# Patient Record
Sex: Male | Born: 1940
Health system: Southern US, Community
[De-identification: ages and names within clinical notes are randomized; demographics above are authoritative.]

## PROBLEM LIST (undated history)

## (undated) DIAGNOSIS — G2581 Restless legs syndrome: Secondary | ICD-10-CM

## (undated) DIAGNOSIS — E785 Hyperlipidemia, unspecified: Secondary | ICD-10-CM

## (undated) DIAGNOSIS — S060X9A Concussion with loss of consciousness of unspecified duration, initial encounter: Secondary | ICD-10-CM

## (undated) DIAGNOSIS — E669 Obesity, unspecified: Secondary | ICD-10-CM

## (undated) DIAGNOSIS — G510 Bell's palsy: Secondary | ICD-10-CM

## (undated) DIAGNOSIS — S060XAA Concussion with loss of consciousness status unknown, initial encounter: Secondary | ICD-10-CM

## (undated) DIAGNOSIS — E1169 Type 2 diabetes mellitus with other specified complication: Secondary | ICD-10-CM

## (undated) DIAGNOSIS — H269 Unspecified cataract: Secondary | ICD-10-CM

## (undated) DIAGNOSIS — M5416 Radiculopathy, lumbar region: Secondary | ICD-10-CM

## (undated) DIAGNOSIS — N200 Calculus of kidney: Secondary | ICD-10-CM

## (undated) DIAGNOSIS — K219 Gastro-esophageal reflux disease without esophagitis: Secondary | ICD-10-CM

## (undated) DIAGNOSIS — M1711 Unilateral primary osteoarthritis, right knee: Secondary | ICD-10-CM

## (undated) DIAGNOSIS — R112 Nausea with vomiting, unspecified: Secondary | ICD-10-CM

## (undated) DIAGNOSIS — M1611 Unilateral primary osteoarthritis, right hip: Secondary | ICD-10-CM

## (undated) DIAGNOSIS — M48061 Spinal stenosis, lumbar region without neurogenic claudication: Secondary | ICD-10-CM

## (undated) DIAGNOSIS — H409 Unspecified glaucoma: Secondary | ICD-10-CM

## (undated) DIAGNOSIS — Z9889 Other specified postprocedural states: Secondary | ICD-10-CM

## (undated) DIAGNOSIS — M1990 Unspecified osteoarthritis, unspecified site: Secondary | ICD-10-CM

## (undated) DIAGNOSIS — R351 Nocturia: Secondary | ICD-10-CM

## (undated) DIAGNOSIS — I10 Essential (primary) hypertension: Secondary | ICD-10-CM

## (undated) HISTORY — PX: RECONSTRUCTION OF EYELID: SHX6576

## (undated) HISTORY — PX: LITHOTRIPSY: SUR834

## (undated) HISTORY — DX: Concussion with loss of consciousness of unspecified duration, initial encounter: S06.0X9A

## (undated) HISTORY — PX: CATARACT EXTRACTION: SUR2

## (undated) HISTORY — PX: COLONOSCOPY: SHX174

## (undated) HISTORY — PX: ABDOMINAL HERNIA REPAIR: SHX539

## (undated) HISTORY — DX: Unilateral primary osteoarthritis, right knee: M17.11

## (undated) HISTORY — PX: ELBOW SURGERY: SHX618

## (undated) HISTORY — DX: Unspecified cataract: H26.9

## (undated) HISTORY — DX: Bell's palsy: G51.0

## (undated) HISTORY — PX: SHOULDER SURGERY: SHX246

## (undated) HISTORY — PX: LUMBAR DISC SURGERY: SHX700

## (undated) HISTORY — DX: Concussion with loss of consciousness status unknown, initial encounter: S06.0XAA

## (undated) HISTORY — PX: BACK SURGERY: SHX140

---

## 1978-03-30 HISTORY — PX: HERNIA REPAIR: SHX51

## 1978-03-30 HISTORY — PX: SHOULDER ARTHROSCOPY: SHX128

## 1988-03-30 HISTORY — PX: ELBOW ARTHROSCOPY: SUR87

## 1997-07-04 ENCOUNTER — Ambulatory Visit (HOSPITAL_COMMUNITY): Admission: RE | Admit: 1997-07-04 | Discharge: 1997-07-04 | Payer: Self-pay | Admitting: Family Medicine

## 2005-11-09 ENCOUNTER — Ambulatory Visit: Payer: Self-pay | Admitting: Gastroenterology

## 2005-11-20 ENCOUNTER — Ambulatory Visit: Payer: Self-pay | Admitting: Gastroenterology

## 2006-03-30 HISTORY — PX: JOINT REPLACEMENT: SHX530

## 2006-06-08 ENCOUNTER — Encounter: Admission: RE | Admit: 2006-06-08 | Discharge: 2006-06-08 | Payer: Self-pay | Admitting: Family Medicine

## 2006-06-22 ENCOUNTER — Encounter: Admission: RE | Admit: 2006-06-22 | Discharge: 2006-06-22 | Payer: Self-pay | Admitting: Family Medicine

## 2006-07-29 HISTORY — PX: TOTAL KNEE ARTHROPLASTY: SHX125

## 2006-08-19 ENCOUNTER — Encounter: Admission: RE | Admit: 2006-08-19 | Discharge: 2006-08-19 | Payer: Self-pay | Admitting: Family Medicine

## 2007-08-02 ENCOUNTER — Ambulatory Visit: Admission: RE | Admit: 2007-08-02 | Discharge: 2007-08-02 | Payer: Self-pay | Admitting: Orthopedic Surgery

## 2007-08-04 ENCOUNTER — Ambulatory Visit: Payer: Self-pay | Admitting: Internal Medicine

## 2007-08-05 ENCOUNTER — Ambulatory Visit: Payer: Self-pay

## 2007-08-15 ENCOUNTER — Inpatient Hospital Stay (HOSPITAL_COMMUNITY): Admission: RE | Admit: 2007-08-15 | Discharge: 2007-08-17 | Payer: Self-pay | Admitting: Orthopedic Surgery

## 2008-03-30 HISTORY — PX: STONE EXTRACTION WITH BASKET: SHX5318

## 2009-02-27 ENCOUNTER — Encounter: Admission: RE | Admit: 2009-02-27 | Discharge: 2009-02-27 | Payer: Self-pay | Admitting: Orthopedic Surgery

## 2009-03-01 DIAGNOSIS — M109 Gout, unspecified: Secondary | ICD-10-CM | POA: Insufficient documentation

## 2009-03-26 ENCOUNTER — Encounter: Admission: RE | Admit: 2009-03-26 | Discharge: 2009-03-26 | Payer: Self-pay | Admitting: Urology

## 2009-04-01 ENCOUNTER — Ambulatory Visit (HOSPITAL_BASED_OUTPATIENT_CLINIC_OR_DEPARTMENT_OTHER): Admission: RE | Admit: 2009-04-01 | Discharge: 2009-04-01 | Payer: Self-pay | Admitting: Urology

## 2009-04-18 ENCOUNTER — Ambulatory Visit (HOSPITAL_COMMUNITY): Admission: RE | Admit: 2009-04-18 | Discharge: 2009-04-18 | Payer: Self-pay | Admitting: Urology

## 2010-06-15 LAB — POCT I-STAT 4, (NA,K, GLUC, HGB,HCT)
Glucose, Bld: 126 mg/dL — ABNORMAL HIGH (ref 70–99)
HCT: 48 % (ref 39.0–52.0)
Hemoglobin: 16.3 g/dL (ref 13.0–17.0)
Potassium: 4.2 mEq/L (ref 3.5–5.1)
Sodium: 141 mEq/L (ref 135–145)

## 2010-08-12 NOTE — Op Note (Signed)
NAMEKORBIN, MAPPS               ACCOUNT NO.:  [id number]   MEDICAL RECORD NO.:  [id number]          PATIENT TYPE:  INP   LOCATION:  5022                         FACILITY:  [facility]   PHYSICIAN:  Robert A. Thurston Hole, M.D. DATE OF BIRTH:  [birth year]   DATE OF PROCEDURE:  08/15/2007  DATE OF DISCHARGE:                               OPERATIVE REPORT   PREOPERATIVE DIAGNOSIS:  Left knee degenerative joint disease.   POSTOPERATIVE DIAGNOSIS:  Left knee degenerative joint disease.   PROCEDURE:  Left total knee replacement using DePuy cemented total knee  system with #6 cemented femur and #6 cemented tibia with 12.5-mm  polyethylene RP tibial spacer and 38-mm polyethylene cemented patella.   SURGEON:  Elana Alm. Thurston Hole, MD   ASSISTANT:  Julien Girt, PA   ANESTHESIA:  General.   OPERATIVE TIME:  1 hour and 30 minutes.   COMPLICATIONS:  None.   DESCRIPTION OF PROCEDURE:  Mr. Curci was brought to the operating room  on Aug 15, 2007, after a femoral nerve block placed in the holding area  by Anesthesia.  He was placed on the operative table in supine position.  He received vancomycin 1.5 grams IV preoperatively for prophylaxis.  After being placed under general anesthesia, Foley catheter placed under  sterile conditions.  His left knee was examined.  Range of motion from  minus 8 to 125 degrees with moderate varus deformity.  Knee stable  ligamentous exam, normal patellar tracking.  Left leg was prepped using  sterile DuraPrep and draped using sterile technique.  Leg was  exsanguinated and a thigh tourniquet elevated at 365 mm.  Time-out was  called,  initialized and agreed to with everyone in the room.  Initially, through a 15-cm longitudinal incision based over the patella,  initial exposure was made.  Underlying subcutaneous tissues were incised  along with skin incision.  A median arthrotomy was performed revealing  an excessive amount of normal-appearing joint fluid.  The  articular  surfaces were inspected.  He had grade 4 changes medially, grade 3 and 4  changes laterally, and grade 3 and 4 changes of patellofemoral joint.  Osteophytes removed from the femoral condyles and tibial plateau.  The  medial lateral meniscal remnants were removed as well as the anterior  cruciate ligament.  An intramedullary drill was then drilled up the  femoral canal for placement of distal femoral cutting jig, which was  placed in the appropriate manner of rotation and distal 13-mm cut was  made.  The distal femur was incised.  The #6 was found be the  appropriate size.  The #6 cutting jig was placed in the appropriate  manner of external rotation and then these cuts were made.  At this  point, the proximal tibia was exposed.  The tibial spines were removed  with an oscillating saw.  Intramedullary drill was drilled down to the  tibial canal for placement of proximal tibial cutting jig, which was  placed in the appropriate manner of rotation and a proximal 6-mm cut was  made based off the medial or lower side.  Spacer blocks were  then placed  in flexion and extension.  A 12.5-mm blocks gave excellent balancing,  excellent stability, and excellent correction of his flexion and varus  deformities.  At this point, the #6 tibial base plate trial was placed  on the cut tibial surface with an excellent fit and a keel cut was made.  The PCL box cutter was then placed on the distal femur, and then these  cuts were made.  After this was done, then the #6 femoral trial was  placed with the #6 tibial base plate trial and a 12.5-mm polyethylene RP  tibial spacer, the knee was reduced, taken through range of motion from  0 to 135 degrees with excellent stability, excellent correction of his  flexion, and varus deformities and normal patellar tracking.  At this  point, a resurfacing 10-mm cut was made on the patella and 3 locking  holes placed for a 38-mm patella.  The patella trial was  again placed  again patellofemoral tracking was evaluated and found to be normal.  At  this point, it was felt that all the trial components were of excellent  size and stability.  They were then removed.  The knee was then jet  lavage irrigated with 3 liters of saline.  The proximal tibia was then  exposed.  A #6 tibial baseplate with cement backing was hammered into  position with an excellent fit with excess cement being removed from  around the edges.  The  #6 femoral component with cement backing was  hammered into position also with an excellent fit with excess cement  being removed from around the edges.  A 12.5-mm polyethylene RP tibial  spacer was placed on tibial baseplate.  The knee reduced, taken to full  range of motion, found to be stable with excellent correction of his  flexion and varus deformities and normal patellar tracking.  The 38-mm  polyethylene cement backed patella was then placed in its position and  held there with a clamp.  After the cement hardened again,  patellofemoral tracking was evaluated and found to be normal.  At this  point, felt that all the components were of excellent size and  stability.  The wound was further irrigated with saline.  The tourniquet  was then released.  Hemostasis obtained with cautery.  The arthrotomy  was then closed with #1 Ethilon suture over 2 medium Hemovac drains.  Subcutaneous tissues closed with 0 and 2-0 Vicryl.  Subcuticular layer  closed with 4-0 Monocryl.  Sterile dressings and long-leg splint  applied.  The patient then awakened, extubated, and taken to recovery  room in stable condition.  Needle and sponge counts were correct x2 at  the end of the case.  Neurovascular status normal as well  postoperatively.      Robert A. Thurston Hole, M.D.  Electronically Signed     RAW/MEDQ  D:  08/15/2007  T:  08/16/2007  Job:  [id number]

## 2010-08-12 NOTE — Letter (Signed)
Aug 04, 2007    Robert A. Thurston Hole, M.D.  [address]  [address]  [place], [place] [address]   RE:  Ronnie, Beck  MRN:  [id number]  /  DOB:  1940/07/15   Dear [name]:   Thank you very much for asking [place] to see [name] in preoperative  consultation for total knee replacement scheduled next week.  This is  being undertaken because of longstanding problems with his knee and  transferred issues to the hip and back.   On his preoperative screening, he was noted to have an abnormal  electrocardiogram, and he was referred.  The patient denies chest pain,  exercise intolerance.  He does not have shortness of breath, peripheral  edema, or nocturnal dyspnea.   He has had some irregular palpitations, but these have been fairly  quiescent of late.   His cardiac risk factors are notable for (1) diabetes, (2) hypertension,  (3) dyslipidemia, (4) family history, and he has abdominal obesity also  constituting the metabolic syndrome.   He has not used cigarettes for many, many years.   PAST MEDICAL HISTORY:  In addition to the above is notable for:  1. GE reflux.  2. Arthritis.  3. Gout.  4. Allergies.   PAST SURGICAL HISTORY:  Notable for eye surgery.   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  He now works as a Lawyer.   MEDICATIONS:  1. Allopurinol 300.  2. Lisinopril 20.  3. Norvasc 10.  4. Hydrochlorothiazide 25.  5. Aspirin 81.   ALLERGIES:  NO KNOWN DRUG ALLERGIES.   PHYSICAL EXAMINATION:  GENERAL:  He is an elderly Caucasian male  appearing his stated age of 39.  He was in no acute distress.  VITAL SIGNS:  His weight is 258 pounds.  Blood pressure 138/85, pulse  90.  SKIN:  Warm and dry.  HEENT:  No icterus or xanthoma.  NECK:  The neck veins were flat.  The carotids were brisk and full  bilaterally without bruits.  BACK:  Without kyphosis, scoliosis.  LUNGS:  Clear.  HEART:  Heart sounds were regular without  murmurs or gallops.  ABDOMEN:  Soft with active bowel sounds without midline pulsation or  hepatomegaly.  EXTREMITIES:  Femoral pulses were 2+, distal pulses were intact.  There  was no clubbing, cyanosis or edema.  NEUROLOGIC:  Grossly normal.   Electrocardiogram from preoperative surgery demonstrated sinus rhythm  with first-degree AV block with a PR interval of 0.22, with the other  intervals were 0.9/0.40.  The rate was 60 beats per minute.   IMPRESSION:  1. Anticipated knee replacement surgery.  2. Abnormal electrocardiogram manifested by first-degree      atrioventricular block.  3. Cardiac risk factors including metabolic syndrome including      diabetes, hypertension, abdominal obesity, dyslipidemia.  4. Family history.   DISCUSSION:  [name], [name] has a minimally abnormal  electrocardiogram.  The major issue in my mind is his multiple risk  factors for coronary disease for which there are no symptoms.  This is  in a diabetic is not all that reassuring.  There has been a debate  recently in the literature on the value of the screening of asymptomatic  diabetics for coronary disease and has not been largely in favor.  However, given the magnitude of his surgery and his family history, I am  inclined to air on the side of caution here with  [name].  I have  reviewed the  above with him, including the borderline indication for proceeding with  Myoview scanning.  We will plan to do this tomorrow in anticipation of  his surgery on Monday.   Best of luck.  Please let us know when he is there, and we will be glad  to follow him along side.    Sincerely,      Duke Salvia, MD, [facility]  Electronically Signed    SCK/MedQ  DD: 08/04/2007  DT: 08/04/2007  Job #: [id number]   CC:    Quita Skye. Artis Flock, M.D.

## 2010-12-24 LAB — BASIC METABOLIC PANEL
BUN: 15
BUN: 15
CO2: 29
CO2: 31
Calcium: 8.3 — ABNORMAL LOW
Calcium: 8.4
Chloride: 101
Chloride: 101
Creatinine, Ser: 1.07
Creatinine, Ser: 1.23
GFR calc Af Amer: >60
GFR calc Af Amer: >60
GFR calc non Af Amer: 59 — ABNORMAL LOW
GFR calc non Af Amer: >60
Glucose, Bld: 133 — ABNORMAL HIGH
Glucose, Bld: 145 — ABNORMAL HIGH
Potassium: 3.4 — ABNORMAL LOW
Potassium: 3.9
Sodium: 135
Sodium: 136

## 2010-12-24 LAB — CBC
HCT: 39.3
HCT: 39.4
Hemoglobin: 13.5
Hemoglobin: 13.9
MCHC: 34.3
MCHC: 35.4
MCV: 90.5
MCV: 90.6
Platelets: 206
Platelets: 206
RBC: 4.34
RBC: 4.35
RDW: 14.4
RDW: 14.8
WBC: 10.1
WBC: 10.8 — ABNORMAL HIGH

## 2010-12-24 LAB — PROTIME-INR
INR: 1.2
INR: 1.4
Prothrombin Time: 15
Prothrombin Time: 17 — ABNORMAL HIGH

## 2011-03-30 ENCOUNTER — Other Ambulatory Visit: Payer: Self-pay

## 2011-03-30 ENCOUNTER — Emergency Department (HOSPITAL_COMMUNITY)
Admission: EM | Admit: 2011-03-30 | Discharge: 2011-03-30 | Disposition: A | Discharge status: Home / Self Care | Payer: Medicare Other | Attending: Emergency Medicine | Admitting: Emergency Medicine

## 2011-03-30 ENCOUNTER — Encounter: Payer: Self-pay | Admitting: *Deleted

## 2011-03-30 DIAGNOSIS — Z9889 Other specified postprocedural states: Secondary | ICD-10-CM | POA: Insufficient documentation

## 2011-03-30 DIAGNOSIS — R109 Unspecified abdominal pain: Secondary | ICD-10-CM | POA: Insufficient documentation

## 2011-03-30 DIAGNOSIS — I10 Essential (primary) hypertension: Secondary | ICD-10-CM | POA: Insufficient documentation

## 2011-03-30 DIAGNOSIS — N201 Calculus of ureter: Secondary | ICD-10-CM | POA: Insufficient documentation

## 2011-03-30 DIAGNOSIS — E119 Type 2 diabetes mellitus without complications: Secondary | ICD-10-CM | POA: Insufficient documentation

## 2011-03-30 HISTORY — DX: Essential (primary) hypertension: I10

## 2011-03-30 HISTORY — DX: Calculus of kidney: N20.0

## 2011-03-30 LAB — URINALYSIS, ROUTINE W REFLEX MICROSCOPIC
Bilirubin Urine: NEGATIVE
Glucose, UA: 100 mg/dL — AB
Ketones, ur: NEGATIVE mg/dL
Leukocytes, UA: NEGATIVE
Nitrite: NEGATIVE
Protein, ur: NEGATIVE mg/dL
Specific Gravity, Urine: 1.022 (ref 1.005–1.030)
Urobilinogen, UA: 0.2 mg/dL (ref 0.0–1.0)
pH: 5.5 (ref 5.0–8.0)

## 2011-03-30 LAB — COMPREHENSIVE METABOLIC PANEL
ALT: 79 U/L — ABNORMAL HIGH (ref 0–53)
AST: 53 U/L — ABNORMAL HIGH (ref 0–37)
Albumin: 2.6 g/dL — ABNORMAL LOW (ref 3.5–5.2)
Alkaline Phosphatase: 58 U/L (ref 39–117)
BUN: 81 mg/dL — ABNORMAL HIGH (ref 6–23)
CO2: 29 mEq/L (ref 19–32)
Calcium: 10.3 mg/dL (ref 8.4–10.5)
Chloride: 92 mEq/L — ABNORMAL LOW (ref 96–112)
Creatinine, Ser: 2.71 mg/dL — ABNORMAL HIGH (ref 0.50–1.35)
GFR calc Af Amer: 26 mL/min — ABNORMAL LOW (ref >90)
GFR calc non Af Amer: 22 mL/min — ABNORMAL LOW (ref >90)
Glucose, Bld: 297 mg/dL — ABNORMAL HIGH (ref 70–99)
Potassium: 3.5 mEq/L (ref 3.5–5.1)
Sodium: 132 mEq/L — ABNORMAL LOW (ref 135–145)
Total Bilirubin: 0.3 mg/dL (ref 0.3–1.2)
Total Protein: 6.7 g/dL (ref 6.0–8.3)

## 2011-03-30 LAB — URINE MICROSCOPIC-ADD ON

## 2011-03-30 MED ORDER — OXYCODONE-ACETAMINOPHEN 5-325 MG PO TABS: 1.0000 | ORAL_TABLET | ORAL | Status: DC | PRN

## 2011-03-30 MED ORDER — SODIUM CHLORIDE 0.9 % IV SOLN: INTRAVENOUS | Status: DC

## 2011-03-30 NOTE — ED Notes (Signed)
Pt states "was out of town when the pain started, it really jumped on me on the 24th, left flank pain, ocassionally nausea, no vomiting"

## 2011-03-30 NOTE — ED Provider Notes (Cosign Needed)
History     CSN: [id number]  Arrival date & time 03/30/11  1311   First MD Initiated Contact with Patient 03/30/11 1648      Chief Complaint  Patient presents with  . Flank Pain    (Consider location/radiation/quality/duration/timing/severity/associated sxs/prior treatment) HPI Comments: Patient is a 70 year old man who developed flank pain on the left side about a week ago. He was out of town then. He has a history of ureteral stones, which he required stenting and also lithotripsy. He was seen by his primary care physician, Maryelizabeth Rowan M.D., who ordered a CT of his abdomen. This showed an 11 and a 6 mm stone in the left ureter at the level of L3-4, stacked one on top of the other. There was an associated hydronephrosis. He was therefore devised to go to the ED for further evaluation and urologic consultation. He had previously been treated by Jethro Bolus M.D.  Patient is a 70 y.o. male presenting with flank pain. The history is provided by the patient and medical records. No language interpreter was used.  Flank Pain This is a new problem. The current episode started more than 1 week ago. Episode frequency: He has intermittent left flank pain. The problem has not changed since onset.The symptoms are aggravated by nothing. The symptoms are relieved by nothing. He has tried nothing for the symptoms.    Past Medical History  Diagnosis Date  . Kidney stones   . Hypertension   . Diabetes mellitus     Past Surgical History  Procedure Date  . Lithotripsy   . Joint replacement     left knee  . Shoulder surgery     right  . Elbow surgery     right  . Abdominal hernia repair     No family history on file.  History  Substance Use Topics  . Smoking status: Never Smoker   . Smokeless tobacco: Not on file  . Alcohol Use: No      Review of Systems  Constitutional: Negative.  Negative for fever.  HENT: Negative.   Eyes: Negative.   Respiratory: Negative.     Cardiovascular: Negative.   Gastrointestinal: Negative.   Genitourinary: Positive for flank pain.  Musculoskeletal: Negative.   Skin: Negative.   Neurological: Negative.   Psychiatric/Behavioral: Negative.     Allergies  Review of patient's allergies indicates no known allergies.  Home Medications   Current Outpatient Rx  Name Route Sig Dispense Refill  . ALLOPURINOL 300 MG PO TABS Oral Take 300 mg by mouth daily.      . ASPIRIN 81 MG PO TABS Oral Take 160 mg by mouth daily.      [name] CIPROFLOXACIN HCL 500 MG PO TABS Oral Take 500 mg by mouth 2 (two) times daily.      [name] DICLOFENAC SODIUM 1 % TD GEL Topical Apply topically.      [name] GEMFIBROZIL 600 MG PO TABS Oral Take 300 mg by mouth 2 (two) times daily before a meal.      . METFORMIN HCL 500 MG PO TABS Oral Take 500 mg by mouth 2 (two) times daily with a meal.      . OLMESARTAN-AMLODIPINE-HCTZ 40-10-25 MG PO TABS Oral Take by mouth.      [name] ROPINIROLE HCL 3 MG PO TABS Oral Take 3 mg by mouth at bedtime.      [name] TADALAFIL 10 MG PO TABS Oral Take 10 mg by mouth daily as needed.      [name]  TESTOSTERONE 1.25 GM/ACT (1%) TD GEL Transdermal Place onto the skin.      [name] TRAMADOL HCL 50 MG PO TABS Oral Take 50 mg by mouth every 6 (six) hours as needed. Maximum dose= 8 tablets per day       BP 133/71  Pulse 92  Temp(Src) 98.4 F (36.9 C) (Oral)  Resp 18  Wt 241 lb (109.317 kg)  SpO2 97%  Physical Exam  Constitutional: He is oriented to person, place, and time. He appears well-developed. Distressed: in no current distress.  HENT:  Head: Normocephalic.  Right Ear: External ear normal.  Left Ear: External ear normal.  Mouth/Throat: Oropharynx is clear and moist.  Eyes: Conjunctivae and EOM are normal. Pupils are equal, round, and reactive to light.  Neck: Normal range of motion. Neck supple.  Cardiovascular: Normal rate, regular rhythm and normal heart sounds.   Pulmonary/Chest: Effort normal and breath sounds normal.  Abdominal: Soft.  Bowel sounds are normal.       He localizes pain to the left flank and the left costovertebral angle area. There is no palpable deformity or point tenderness there.  Musculoskeletal: Normal range of motion.  Neurological: He is alert and oriented to person, place, and time.       No sensory or motor loss.  Skin: Skin is warm and dry.  Psychiatric: He has a normal mood and affect. His behavior is normal.    ED Course  Procedures (including critical care time)  5:14 PM Had CT reviewed unofficially--has an 11 mm and a 6 mm stone stacked on top of each other at the level of L3-4, with an associated hydronephrosis. A call was placed to urology. Laboratory workup was ordered.   Date: 03/30/2011  Rate:97 Rhythm: normal sinus rhythm  QRS Axis: normal  Intervals: normal  ST/T Wave abnormalities: normal  Conduction Disutrbances:none  Narrative Interpretation: Normal EKG  Old EKG Reviewed: unchanged  7:18 PM Results for orders placed during the hospital encounter of 03/30/11  COMPREHENSIVE METABOLIC PANEL      Component Value Range   Sodium 132 (*) 135 - 145 (mEq/L)   Potassium 3.5  3.5 - 5.1 (mEq/L)   Chloride 92 (*) 96 - 112 (mEq/L)   CO2 29  19 - 32 (mEq/L)   Glucose, Bld 297 (*) 70 - 99 (mg/dL)   BUN 81 (*) 6 - 23 (mg/dL)   Creatinine, Ser 1.61 (*) 0.50 - 1.35 (mg/dL)   Calcium 09.6  8.4 - 10.5 (mg/dL)   Total Protein 6.7  6.0 - 8.3 (g/dL)   Albumin 2.6 (*) 3.5 - 5.2 (g/dL)   AST 53 (*) 0 - 37 (U/L)   ALT 79 (*) 0 - 53 (U/L)   Alkaline Phosphatase 58  39 - 117 (U/L)   Total Bilirubin 0.3  0.3 - 1.2 (mg/dL)   GFR calc non Af Amer 22 (*) >90 (mL/min)   GFR calc Af Amer 26 (*) >90 (mL/min)  URINALYSIS, ROUTINE W REFLEX MICROSCOPIC      Component Value Range   Color, Urine YELLOW  YELLOW    APPearance CLEAR  CLEAR    Specific Gravity, Urine 1.022  1.005 - 1.030    pH 5.5  5.0 - 8.0    Glucose, UA 100 (*) NEGATIVE (mg/dL)   Hgb urine dipstick TRACE (*) NEGATIVE    Bilirubin  Urine NEGATIVE  NEGATIVE    Ketones, ur NEGATIVE  NEGATIVE (mg/dL)   Protein, ur NEGATIVE  NEGATIVE (mg/dL)   Urobilinogen,  UA 0.2  0.0 - 1.0 (mg/dL)   Nitrite NEGATIVE  NEGATIVE    Leukocytes, UA NEGATIVE  NEGATIVE   URINE MICROSCOPIC-ADD ON      Component Value Range   WBC, UA 0-2  <3 (WBC/hpf)   RBC / HPF 0-2  <3 (RBC/hpf)   Bacteria, UA FEW (*) RARE    Pt's lab workup was reviewed. He continues asymptomatic.  Rx Percocet for pain.  Dr. Lenoria Chime office will call him for a time to come in on Wednesday, April 01, 2011, to be scheduled for lithotripsy on April 02, 2011.    1. Left ureteral calculus          Carleene Cooper III, MD 03/30/11 [phone]

## 2011-03-31 LAB — URINE CULTURE
Colony Count: NO GROWTH
Culture  Setup Time: 201301010148
Culture: NO GROWTH

## 2011-04-02 ENCOUNTER — Encounter (HOSPITAL_COMMUNITY): Payer: Self-pay | Admitting: *Deleted

## 2011-04-02 ENCOUNTER — Other Ambulatory Visit: Payer: Self-pay | Admitting: Urology

## 2011-04-02 NOTE — Progress Notes (Signed)
Pt instructed to eat a good snack Sun pm before going to bed and do not take Metformin 04/06/11 am

## 2011-04-02 NOTE — Progress Notes (Signed)
Pt instructed no Aspirin products 3 days prior to procedure and to take laxative 04/05/11 pm

## 2011-04-03 ENCOUNTER — Encounter (HOSPITAL_COMMUNITY): Payer: Self-pay

## 2011-04-03 NOTE — H&P (Signed)
Chief Complaint  cc: Dr. Maryelizabeth Rowan   Reason For Visit  Kidney stones   Active Problems Problems  1. Distal Ureteral Stone On The Right 592.1 2. Gout 274.00 3. Microscopic Hematuria 599.72 4. Nausea (Symptom) 787.02 5. Nephrolithiasis Of The Left Kidney 592.0 6. Proximal Ureteral Stone On The Left 592.1  History of Present Illness           71 yo male returns today for f/u after recently being seen in the ER 03/30/11 for Lt flank pain.  CT showed high grade obstruction with two ureteral stones, 11mm & a 6mm stacked on top of each other.  He is post lithotripsy of L lithotripsy in January 2011. Additionally bilateral renal stones & cholelithiasis. Stone analysis showed ca/oxalate. He was treated with water, but no medication.     Sodas: occasional. No hx stones. No family hx stone. + hx gout, on allopurinol. He is post extraction of R distal ureteral stone on 04/01/09 and lithotripsy of the L renal stone 05/05/09. He has passed multiple stones.   Past Medical History Problems  1. History of  Arthritis V13.4 2. History of  Glaucoma 365.9 3. History of  Gout 274.00 4. History of  Hypertension 401.9 5. History of  Nephrolithiasis V13.01  Surgical History Problems  1. History of  Cystoscopy With Insertion Of Ureteral Stent Bilateral 2. History of  Cystoscopy With Ureteroscopy With Lithotripsy 3. History of  Elbow Surgery 4. History of  Inguinal Hernia Repair 5. History of  Knee Replacement 6. History of  Lithotripsy 7. History of  Shoulder Surgery  Current Meds 1. Allopurinol 300 MG Oral Tablet; Therapy: (Recorded:21Dec2010) to 2. AmLODIPine Besylate 10 MG Oral Tablet; Therapy: (Recorded:21Dec2010) to 3. AndroGel Pump 1.25 GM/ACT (1%) Transdermal Gel; Therapy: (Recorded:02Jan2013) to 4. Aspirin 81 MG Oral Tablet; Therapy: (Recorded:21Dec2010) to 5. Gemfibrozil 600 MG Oral Tablet; Therapy: (Recorded:21Dec2010) to 6. MetFORMIN HCl 500 MG Oral Tablet; Therapy:  (Recorded:02Jan2013) to 7. Percocet 5-325 MG Oral Tablet; Therapy: (Recorded:02Jan2013) to 8. Requip TABS; Therapy: (Recorded:02Jan2013) to 9. Saw Palmetto CAPS; Therapy: (Recorded:21Dec2010) to 10. TraMADol HCl 50 MG Oral Tablet; Therapy: (Recorded:02Jan2013) to 11. Tribenzor 40-10-25 MG Oral Tablet; Therapy: (Recorded:02Jan2013) to 12. Voltaren 1 % Transdermal Gel; Therapy: (Recorded:02Jan2013) to  Allergies Medication  1. No Known Drug Allergies  Family History Problems  1. Paternal history of  Death In The Family Father age 73; blood clots and heart problems 2. Maternal history of  Death In The Family Mother age 63; kidney failure and heart attack 3. Family history of  Family Health Status Number Of Children 1 son; 1 daughter  Social History Problems  1. Alcohol Use 1 a month 2. Caffeine Use coffee and cola- decaf 3. Marital History - Currently Married 4. Never A Smoker 5. Occupation: retired Denied  6. History of  Tobacco Use  Review of Systems  Genitourinary: hematuria.  Gastrointestinal: nausea, flank pain and abdominal pain, but no vomiting and no heartburn.  Constitutional: fever and feeling tired (fatigue).    Vitals Vital Signs [Data Includes: Last 1 Day]  02Jan2013 10:13AM  BMI Calculated: 31.81 BSA Calculated: 2.32 Height: 6 ft 1 in Weight: 240 lb  Blood Pressure: 126 / 75 Temperature: 98.7 F Heart Rate: 87  Physical Exam Abdomen: The abdomen is soft and nontender. No masses are palpated. mild left CVA tenderness no CVA tenderness. No hernias are palpable. No hepatosplenomegaly noted.    Results/Data Urine [Data Includes: Last 1 Day]   02Jan2013  COLOR YELLOW  APPEARANCE CLEAR   SPECIFIC GRAVITY 1.025   pH 5.0   GLUCOSE 100 mg/dL  BILIRUBIN NEG   KETONE NEG mg/dL  BLOOD TRACE   PROTEIN NEG mg/dL  UROBILINOGEN 0.2 mg/dL  NITRITE NEG   LEUKOCYTE ESTERASE SMALL   SQUAMOUS EPITHELIAL/HPF NONE SEEN   WBC 7-10 WBC/hpf  RBC 0-3 RBC/hpf    BACTERIA RARE   CRYSTALS NONE SEEN   CASTS NONE SEEN    Assessment Assessed  1. Proximal Ureteral Stone On The Left 592.1 2. Nausea (Symptom) 787.02 3. Nephrolithiasis Of The Left Kidney 592.0      71 yo male with hx of Ca/oxalate stones, post lithotripsy 2 yrs ago, now with 2 upper R ureteral stones and recent infection (Rocephin), and recently seen in [facility] for pain, with diagnosis. Treated with Percocet. I have reviewed the CT and the old stone analysis, and believe he will need surgery and follow-up medical therapy.   Plan Health Maintenance (V70.0)  1. UA With REFLEX  Done: 02Jan2013 12:15PM Nephrolithiasis Of The Left Kidney (592.0)  2. CREATININE with eGFR  Requested for: 02Jan2013 3. HYPERCALCIURA PROFILE  Requested for: 02Jan2013 4. Litholink Stone Risk-Urine  Requested for: 02Jan2013      Lithotripsy, and 24 hr urine for evaluation.  Ck blood work.  Schedule lithotripsy. Push fluids. Stop ASA.   Signatures Electronically signed by : Jethro Bolus, M.D.; Apr 01 2011  2:05PM

## 2011-04-06 ENCOUNTER — Ambulatory Visit (HOSPITAL_COMMUNITY)
Admission: RE | Admit: 2011-04-06 | Discharge: 2011-04-06 | Disposition: A | Discharge status: Home / Self Care | Payer: Medicare Other | Source: Ambulatory Visit | Attending: Urology | Admitting: Urology

## 2011-04-06 ENCOUNTER — Ambulatory Visit (HOSPITAL_COMMUNITY): Payer: Medicare Other

## 2011-04-06 ENCOUNTER — Encounter (HOSPITAL_COMMUNITY)
Admission: RE | Disposition: A | Discharge status: Home / Self Care | Payer: Self-pay | Source: Ambulatory Visit | Attending: Urology

## 2011-04-06 ENCOUNTER — Encounter (HOSPITAL_COMMUNITY): Payer: Self-pay | Admitting: *Deleted

## 2011-04-06 DIAGNOSIS — M109 Gout, unspecified: Secondary | ICD-10-CM | POA: Insufficient documentation

## 2011-04-06 DIAGNOSIS — N201 Calculus of ureter: Secondary | ICD-10-CM | POA: Insufficient documentation

## 2011-04-06 DIAGNOSIS — Z79899 Other long term (current) drug therapy: Secondary | ICD-10-CM | POA: Insufficient documentation

## 2011-04-06 DIAGNOSIS — I10 Essential (primary) hypertension: Secondary | ICD-10-CM | POA: Insufficient documentation

## 2011-04-06 DIAGNOSIS — R3129 Other microscopic hematuria: Secondary | ICD-10-CM | POA: Insufficient documentation

## 2011-04-06 HISTORY — DX: Gastro-esophageal reflux disease without esophagitis: K21.9

## 2011-04-06 HISTORY — DX: Other specified postprocedural states: Z98.890

## 2011-04-06 HISTORY — DX: Unspecified osteoarthritis, unspecified site: M19.90

## 2011-04-06 HISTORY — DX: Other specified postprocedural states: R11.2

## 2011-04-06 SURGERY — LITHOTRIPSY, ESWL
Anesthesia: LOCAL | Site: Pelvis | Laterality: Left

## 2011-04-06 MED ORDER — DIPHENHYDRAMINE HCL 25 MG PO CAPS
ORAL_CAPSULE | ORAL | Status: AC
  Administered 2011-04-06: 25 mg via ORAL
  Filled 2011-04-06: qty 1

## 2011-04-06 MED ORDER — CIPROFLOXACIN HCL 500 MG PO TABS
500.0000 mg | ORAL_TABLET | ORAL | Status: AC
  Administered 2011-04-06: 500 mg via ORAL

## 2011-04-06 MED ORDER — DIAZEPAM 5 MG PO TABS
10.0000 mg | ORAL_TABLET | ORAL | Status: AC
  Administered 2011-04-06: 10 mg via ORAL

## 2011-04-06 MED ORDER — DEXTROSE-NACL 5-0.45 % IV SOLN
INTRAVENOUS | Status: DC
  Administered 2011-04-06: 13:00:00 via INTRAVENOUS

## 2011-04-06 MED ORDER — DIAZEPAM 5 MG PO TABS
ORAL_TABLET | ORAL | Status: AC
  Administered 2011-04-06: 10 mg via ORAL
  Filled 2011-04-06: qty 2

## 2011-04-06 MED ORDER — CIPROFLOXACIN HCL 500 MG PO TABS
ORAL_TABLET | ORAL | Status: AC
  Administered 2011-04-06: 500 mg via ORAL
  Filled 2011-04-06: qty 1

## 2011-04-06 MED ORDER — DIPHENHYDRAMINE HCL 25 MG PO CAPS
25.0000 mg | ORAL_CAPSULE | ORAL | Status: AC
  Administered 2011-04-06: 25 mg via ORAL

## 2011-04-06 NOTE — Interval H&P Note (Signed)
History and Physical Interval Note:  04/06/2011 10:17 AM  [name]  has presented today for surgery, with the diagnosis of left upper ureteral stone  The various methods of treatment have been discussed with the patient and family. After consideration of risks, benefits and other options for treatment, the patient has consented to  Procedure(s): EXTRACORPOREAL SHOCK WAVE LITHOTRIPSY (ESWL) as a surgical intervention .  The patients' history has been reviewed, patient examined, no change in status, stable for surgery.  I have reviewed the patients' chart and labs.  Questions were answered to the patient's satisfaction.     Jethro Bolus I

## 2011-04-06 NOTE — Op Note (Signed)
Pre-operative diagnosis : L upper ureteral stones x 2  Postoperative diagnosis:Same  Operation: Lithotripsy L upper ureteral stone  Surgeon:  S. An Lannan MD  Anesthesia:IV sedation  Preparation:With the pt in prone position, the KUB was reviewed and fluoro coordinated, and time out observed,   Review history:70 yo male seen in [facility] with L renal colic, and found to have 2 ureteral stones with hydronephrosis. Stones measure 8x50mm and 10 x 7mm. Pt desire\s lithotripsy for treatment. He has a hx Ca oxalate stones, and has a hx of gout.   Statement of  Likelihood of Success: Excellent. TIME-OUT observed.:  Procedure: Lithotripsy accomplished with graduated kv to 4.0, with fracture of the stones. (see [facility] stone op note). Taken to op in good position.

## 2011-12-22 ENCOUNTER — Encounter (INDEPENDENT_AMBULATORY_CARE_PROVIDER_SITE_OTHER): Payer: Self-pay

## 2011-12-31 ENCOUNTER — Ambulatory Visit (INDEPENDENT_AMBULATORY_CARE_PROVIDER_SITE_OTHER): Payer: Medicare Other | Admitting: Surgery

## 2011-12-31 ENCOUNTER — Encounter (INDEPENDENT_AMBULATORY_CARE_PROVIDER_SITE_OTHER): Payer: Self-pay | Admitting: Surgery

## 2011-12-31 DIAGNOSIS — K8010 Calculus of gallbladder with chronic cholecystitis without obstruction: Secondary | ICD-10-CM

## 2011-12-31 NOTE — Progress Notes (Signed)
Patient ID: ECTOR LAUREL, male   DOB: 01-23-1941, 71 y.o.   MRN: [id number]  RUQ abdominal pain  HPI [name] is a 71 y.o. male.  Referred by Dr. Maryelizabeth Rowan for evaluation of gallbladder disease HPI This is a 71 year old male who presents with a five-month history of intermittent right upper quadrant abdominal pain. This pain is associated with bloating and diarrhea. He denies any nausea or vomiting. The symptoms seem to be brought on by eating. He has noticed that cheese seems to bother him. He underwent a workup including liver function tests that were normal with a bilirubin of 0.4. An ultrasound showed a 2 cm shadowing gallstone but no sign of wall thickening. Common bile duct was normal. He presents now for surgical evaluation.  Past Medical History  Diagnosis Date  . Kidney stones   . Hypertension   . Diabetes mellitus   . PONV (postoperative nausea and vomiting)   . GERD (gastroesophageal reflux disease)     occ indigestion  . Arthritis     arthirtis all over  . Neuromuscular disorder     restless leg syndrome    Past Surgical History  Procedure Date  . Lithotripsy   . Shoulder surgery     right  . Elbow surgery     right  . Abdominal hernia repair   . Joint replacement     left knee  . Hernia repair 1980    inguinal  . Shoulder arthroscopy 1980    right  . Elbow arthroscopy 1990    right  . Stone extraction with basket 2010    No family history on file.  Social History History  Substance Use Topics  . Smoking status: Never Smoker   . Smokeless tobacco: Not on file  . Alcohol Use: No    No Known Allergies  Current Outpatient Prescriptions  Medication Sig Dispense Refill  . allopurinol (ZYLOPRIM) 300 MG tablet Take 150 mg by mouth every morning.       [name] aspirin 81 MG tablet Take 81 mg by mouth every morning.       . Cholecalciferol (VITAMIN D-3) 5000 UNITS TABS Take by mouth.      . fish oil-omega-3 fatty acids 1000 MG capsule Take 2 g by  mouth daily.      . Garlic 500 MG TABS Take by mouth.      [name] gemfibrozil (LOPID) 600 MG tablet Take 600 mg by mouth 2 (two) times daily.       [name] latanoprost (XALATAN) 0.005 % ophthalmic solution 1 drop at bedtime.      . meloxicam (MOBIC) 15 MG tablet Take 15 mg by mouth daily.      . metFORMIN (GLUCOPHAGE) 500 MG tablet Take 500 mg by mouth 2 (two) times daily.       . Multiple Vitamins-Minerals (VITRUM 50+ SENIOR MULTI PO) Take by mouth.      . Olmesartan-Amlodipine-HCTZ (TRIBENZOR) 40-10-25 MG TABS Take 1 tablet by mouth every morning.       [name] rOPINIRole (REQUIP) 3 MG tablet Take 3 mg by mouth every evening.       . Saw Palmetto 450 MG CAPS Take by mouth.      . Tamsulosin HCl (FLOMAX) 0.4 MG CAPS Take by mouth.      . traMADol (ULTRAM) 50 MG tablet Take 50 mg by mouth 2 (two) times daily as needed. For arthritis pain.       [name] ZINC-MAGNESIUM ASPART-VIT B6  PO Take by mouth.        Review of Systems Review of Systems  Constitutional: Negative for fever, chills and unexpected weight change.  HENT: Positive for congestion. Negative for hearing loss, sore throat, trouble swallowing and voice change.   Eyes: Negative for visual disturbance.  Respiratory: Negative for cough and wheezing.   Cardiovascular: Negative for chest pain, palpitations and leg swelling.  Gastrointestinal: Positive for abdominal pain, diarrhea and abdominal distention. Negative for vomiting, constipation, blood in stool, anal bleeding and rectal pain.  Genitourinary: Negative for hematuria and difficulty urinating.  Musculoskeletal: Positive for arthralgias.  Skin: Negative for rash and wound.  Neurological: Negative for seizures, syncope, weakness and headaches.  Hematological: Negative for adenopathy. Does not bruise/bleed easily.  Psychiatric/Behavioral: Negative for confusion.    There were no vitals taken for this visit.  Physical Exam Physical Exam WDWN in NAD HEENT:  EOMI, sclera anicteric Neck:  No  masses, no thyromegaly Lungs:  CTA bilaterally; normal respiratory effort CV:  Regular rate and rhythm; no murmurs Abd:  +bowel sounds, soft, non-tender, no masses Ext:  Well-perfused; no edema Skin:  Warm, dry; no sign of jaundice  Data Reviewed Ultrasound and labs from [facility] - scanned into system  Assessment    Chronic calculus cholecystitis    Plan    Laparoscopic cholecystectomy with intraoperative cholangiogram.  The surgical procedure has been discussed with the patient.  Potential risks, benefits, alternative treatments, and expected outcomes have been explained.  All of the patient's questions at this time have been answered.  The likelihood of reaching the patient's treatment goal is good.  The patient understand the proposed surgical procedure and wishes to proceed.         Angelic Schnelle K. 12/31/2011, 4:36 PM

## 2011-12-31 NOTE — Patient Instructions (Signed)
Stop your aspirin five days before surgery.

## 2012-01-06 ENCOUNTER — Other Ambulatory Visit (INDEPENDENT_AMBULATORY_CARE_PROVIDER_SITE_OTHER): Payer: Self-pay | Admitting: Surgery

## 2012-01-06 ENCOUNTER — Telehealth (INDEPENDENT_AMBULATORY_CARE_PROVIDER_SITE_OTHER): Payer: Self-pay | Admitting: General Surgery

## 2012-01-06 DIAGNOSIS — K8010 Calculus of gallbladder with chronic cholecystitis without obstruction: Secondary | ICD-10-CM

## 2012-01-06 NOTE — Telephone Encounter (Signed)
Called and left a message for the patient to return the call regarding post op appointment that has been set up. Appointment made for 01/27/12 at 1:40.

## 2012-01-27 ENCOUNTER — Encounter (INDEPENDENT_AMBULATORY_CARE_PROVIDER_SITE_OTHER): Payer: Self-pay | Admitting: Surgery

## 2012-01-27 ENCOUNTER — Ambulatory Visit (INDEPENDENT_AMBULATORY_CARE_PROVIDER_SITE_OTHER): Payer: Medicare Other | Admitting: Surgery

## 2012-01-27 VITALS — BP 122/77 | HR 72 | Temp 98.0°F | Resp 18 | Ht 66.0 in | Wt 248.4 lb

## 2012-01-27 DIAGNOSIS — K8010 Calculus of gallbladder with chronic cholecystitis without obstruction: Secondary | ICD-10-CM

## 2012-01-27 NOTE — Progress Notes (Signed)
Status post laparoscopic cholecystectomy with intraoperative cholangiogram on 01/06/12 for chronic calculus cholecystitis. The patient is doing quite well. Appetite and bowel movements are normal. He denies any problems with diarrhea. His incisions all appear to be well-healed with no sign of infection. Minimal bruising around the umbilicus which seems to be resolving. Appetite is good. He may resume full activity. Followup with [place] as needed.  Wilmon Arms. Corliss Skains, MD, [facility] Surgery  01/27/2012 6:09 PM

## 2012-07-14 DIAGNOSIS — N138 Other obstructive and reflux uropathy: Secondary | ICD-10-CM | POA: Insufficient documentation

## 2012-08-31 ENCOUNTER — Other Ambulatory Visit: Payer: Self-pay | Admitting: Physician Assistant

## 2012-08-31 ENCOUNTER — Encounter: Payer: Self-pay | Admitting: Physician Assistant

## 2012-08-31 DIAGNOSIS — M1711 Unilateral primary osteoarthritis, right knee: Secondary | ICD-10-CM

## 2012-08-31 DIAGNOSIS — N200 Calculus of kidney: Secondary | ICD-10-CM | POA: Insufficient documentation

## 2012-08-31 DIAGNOSIS — M1990 Unspecified osteoarthritis, unspecified site: Secondary | ICD-10-CM | POA: Insufficient documentation

## 2012-08-31 DIAGNOSIS — G709 Myoneural disorder, unspecified: Secondary | ICD-10-CM

## 2012-08-31 NOTE — H&P (Signed)
TOTAL KNEE ADMISSION H&P  Patient is being admitted for right total knee arthroplasty.  Subjective:  Chief Complaint:right knee pain.  HPI: [name], 72 y.o. male, has a history of pain and functional disability in the right knee due to arthritis and has failed non-surgical conservative treatments for greater than 12 weeks to includeNSAID's and/or analgesics, corticosteriod injections, viscosupplementation injections, flexibility and strengthening excercises, supervised PT with diminished ADL's post treatment, use of assistive devices, weight reduction as appropriate and activity modification.  Onset of symptoms was gradual, starting 4 years ago with gradually worsening course since that time. The patient noted prior procedures on the knee to include  arthroscopy and menisectomy on the right knee(s).  Patient currently rates pain in the right knee(s) at 8 out of 10 with activity. Patient has night pain, worsening of pain with activity and weight bearing, pain that interferes with activities of daily living, crepitus and joint swelling.  Patient has evidence of subchondral sclerosis, periarticular osteophytes and joint space narrowing by imaging studies.  There is no active infection.  Patient Active Problem List   Diagnosis Date Noted  . Kidney stones   . Arthritis   . Neuromuscular disorder   . Right knee DJD   . Chronic calculus cholecystitis 12/31/2011   Past Medical History  Diagnosis Date  . Kidney stones   . Hypertension   . Diabetes mellitus   . PONV (postoperative nausea and vomiting)   . GERD (gastroesophageal reflux disease)     occ indigestion  . Arthritis     arthirtis all over  . Neuromuscular disorder     restless leg syndrome  . Right knee DJD     Past Surgical History  Procedure Laterality Date  . Lithotripsy    . Shoulder surgery      right  . Elbow surgery      right  . Abdominal hernia repair    . Total knee arthroplasty Left 07/2006    left knee  .  Hernia repair  1980    inguinal  . Shoulder arthroscopy  1980    right  . Elbow arthroscopy  1990    right  . Stone extraction with basket  2010     (Not in a hospital admission) No Known Allergies     History  Substance Use Topics  . Smoking status: Never Smoker   . Smokeless tobacco: Not on file  . Alcohol Use: No    Family History  Problem Relation Age of Onset  . Hypertension Mother   . Hypertension Father   . Hypertension Brother   . Kidney disease Mother   . Diabetes Mellitus II Father   . Diabetes Mellitus II Brother   . Diabetes Mellitus II Brother   . Cancer - Prostate Brother      Review of Systems  Constitutional: Negative.   HENT: Negative.   Eyes: Negative.   Respiratory: Negative.   Cardiovascular: Negative.   Gastrointestinal: Negative.   Genitourinary: Negative.   Musculoskeletal: Positive for myalgias, back pain and joint pain.       Right knee and left leg sciatica  Skin: Negative.   Neurological: Negative.   Endo/Heme/Allergies: Negative.   Psychiatric/Behavioral: Negative.     Objective:  Physical Exam  Constitutional: He is oriented to person, place, and time. He appears well-developed.  HENT:  Head: Normocephalic and atraumatic.  Mouth/Throat: Oropharynx is clear and moist.  Eyes: Conjunctivae and EOM are normal. Pupils are equal, round, and reactive  to light.  Cardiovascular: Normal rate, regular rhythm and normal heart sounds.  Exam reveals no gallop and no friction rub.   No murmur heard. Respiratory: Effort normal and breath sounds normal. No respiratory distress. He has no wheezes.  GI: Soft. Bowel sounds are normal.  Genitourinary:  Not pertinent to current symptomatology therefore not examined.  Musculoskeletal:  Examination of his right knee reveals 1+ effusion pain medially and laterally, range of motion -5 to 125 degrees knee is stable with diffuse pain and normal patella tracking. Exam of his left knee reveals well healed  total knee incision without swelling or pain, full range of motion knee is stable with normal patella tracking. Vascular exam: pulses 2+ and symmetric. Gait exam reveals that he walks with a significant antalgic gait due to right knee pain and swelling.  Neurological: He is alert and oriented to person, place, and time.  Skin: Skin is warm and dry.  Psychiatric: He has a normal mood and affect. His behavior is normal.    Vital signs in last 24 hours: Last recorded: 06/04 1500   BP: 126/79 Pulse: 94  Temp: 98.1 F (36.7 C)    Height: 5\' 11"  (1.803 m) SpO2: 96  Weight: 102.059 kg (225 lb)     Labs:   Estimated body mass index is 31.39 kg/(m^2) as calculated from the following:   Height as of this encounter: 5\' 11"  (1.803 m).   Weight as of this encounter: 102.059 kg (225 lb).   Imaging Review Plain radiographs demonstrate severe degenerative joint disease of the right knee(s). The overall alignment issignificant varus. The bone quality appears to be good for age and reported activity level.  Assessment/Plan:  End stage arthritis, right knee   The patient history, physical examination, clinical judgment of the provider and imaging studies are consistent with end stage degenerative joint disease of the right knee(s) and total knee arthroplasty is deemed medically necessary. The treatment options including medical management, injection therapy arthroscopy and arthroplasty were discussed at length. The risks and benefits of total knee arthroplasty were presented and reviewed. The risks due to aseptic loosening, infection, stiffness, patella tracking problems, thromboembolic complications and other imponderables were discussed. The patient acknowledged the explanation, agreed to proceed with the plan and consent was signed. Patient is being admitted for inpatient treatment for surgery, pain control, PT, OT, prophylactic antibiotics, VTE prophylaxis, progressive ambulation and ADL's and  discharge planning. The patient is planning to be discharged home with home health services  Kingsten Enfield A. Gwinda Passe Physician Assistant [facility] [phone]  08/31/2012, 3:39 PM

## 2012-09-05 ENCOUNTER — Encounter (HOSPITAL_COMMUNITY): Admission: RE | Admit: 2012-09-05 | Payer: Medicare Other | Source: Ambulatory Visit

## 2012-09-12 ENCOUNTER — Encounter (HOSPITAL_COMMUNITY): Admission: RE | Payer: Self-pay | Source: Ambulatory Visit

## 2012-09-12 ENCOUNTER — Inpatient Hospital Stay (HOSPITAL_COMMUNITY): Admission: RE | Admit: 2012-09-12 | Payer: Medicare Other | Source: Ambulatory Visit | Admitting: Orthopedic Surgery

## 2012-09-12 SURGERY — Surgical Case
Anesthesia: *Unknown

## 2012-09-12 SURGERY — ARTHROPLASTY, KNEE, TOTAL
Anesthesia: General | Laterality: Right

## 2012-12-19 ENCOUNTER — Other Ambulatory Visit: Payer: Self-pay | Admitting: Family Medicine

## 2012-12-19 DIAGNOSIS — R1011 Right upper quadrant pain: Secondary | ICD-10-CM

## 2012-12-20 ENCOUNTER — Ambulatory Visit
Admission: RE | Admit: 2012-12-20 | Discharge: 2012-12-20 | Disposition: A | Discharge status: Home / Self Care | Payer: Commercial Managed Care - HMO | Source: Ambulatory Visit | Attending: Family Medicine | Admitting: Family Medicine

## 2012-12-20 ENCOUNTER — Other Ambulatory Visit: Payer: Medicare Other

## 2012-12-20 ENCOUNTER — Other Ambulatory Visit: Payer: Self-pay | Admitting: Family Medicine

## 2012-12-20 DIAGNOSIS — R1011 Right upper quadrant pain: Secondary | ICD-10-CM

## 2013-02-22 ENCOUNTER — Other Ambulatory Visit: Payer: Self-pay | Admitting: Neurological Surgery

## 2013-03-01 ENCOUNTER — Other Ambulatory Visit: Payer: Self-pay | Admitting: Neurological Surgery

## 2013-03-01 DIAGNOSIS — M5416 Radiculopathy, lumbar region: Secondary | ICD-10-CM

## 2013-03-01 DIAGNOSIS — M25551 Pain in right hip: Secondary | ICD-10-CM

## 2013-03-03 ENCOUNTER — Ambulatory Visit
Admission: RE | Admit: 2013-03-03 | Discharge: 2013-03-03 | Disposition: A | Discharge status: Home / Self Care | Payer: Commercial Managed Care - HMO | Source: Ambulatory Visit | Attending: Neurological Surgery | Admitting: Neurological Surgery

## 2013-03-03 ENCOUNTER — Ambulatory Visit
Admission: RE | Admit: 2013-03-03 | Discharge: 2013-03-03 | Disposition: A | Discharge status: Home / Self Care | Payer: Medicare Other | Source: Ambulatory Visit | Attending: Neurological Surgery | Admitting: Neurological Surgery

## 2013-03-03 DIAGNOSIS — M25551 Pain in right hip: Secondary | ICD-10-CM

## 2013-03-03 DIAGNOSIS — M5416 Radiculopathy, lumbar region: Secondary | ICD-10-CM

## 2013-03-10 ENCOUNTER — Other Ambulatory Visit: Payer: Medicare Other

## 2013-03-16 ENCOUNTER — Other Ambulatory Visit: Payer: Self-pay | Admitting: Neurological Surgery

## 2013-03-20 ENCOUNTER — Encounter (HOSPITAL_COMMUNITY): Payer: Self-pay | Admitting: Pharmacy Technician

## 2013-03-28 NOTE — Pre-Procedure Instructions (Signed)
LENIN KUHNLE  03/28/2013   Your procedure is scheduled on:  Monday, January 5th.  Report to [facility], Main Entrance/Entrance "A" at 11:00 AM.  Call this number if you have problems the morning of surgery: [phone]   Remember:   Do not eat food or drink liquids after midnight.   Take these medicines the morning of surgery with A SIP OF WATER: allopurinol (ZYLOPRIM). May take HYDROcodone-acetaminophen [facility]) if needed.    Stop taking Aspirin, Coumadin, Plavix, Effient and Herbal medications.  Do not take any NSAIDs ie: Ibuprofen,  Advil,Naproxen or any medication containing Aspirin.    Do not wear jewelry.  Do not wear lotions, powders, or colognes. You may wear deodorant.  Men may shave face and neck only.  Do not bring valuables to the hospital.  [facility] is not responsible for any belongings or valuables.               Contacts, dentures or bridgework may not be worn into surgery.  Leave suitcase in the car. After surgery it may be brought to your room.  For patients admitted to the hospital, discharge time is determined by your treatment team.                  Special Instructions: Shower using CHG 2 nights before surgery and the night before surgery.  If you shower the day of surgery use CHG.  Use special wash - you have one bottle of CHG for all showers.  You should use approximately 1/3 of the bottle for each shower.   Please read over the following fact sheets that you were given: Pain Booklet, Coughing and Deep Breathing, Blood Transfusion Information and Surgical Site Infection Prevention

## 2013-03-29 ENCOUNTER — Encounter (HOSPITAL_COMMUNITY): Payer: Self-pay

## 2013-03-29 ENCOUNTER — Encounter (HOSPITAL_COMMUNITY)
Admission: RE | Admit: 2013-03-29 | Discharge: 2013-03-29 | Disposition: A | Discharge status: Home / Self Care | Payer: Medicare Other | Source: Ambulatory Visit | Attending: Neurological Surgery | Admitting: Neurological Surgery

## 2013-03-29 ENCOUNTER — Encounter (HOSPITAL_COMMUNITY)
Admission: RE | Admit: 2013-03-29 | Discharge: 2013-03-29 | Disposition: A | Discharge status: Home / Self Care | Payer: Medicare Other | Source: Ambulatory Visit | Attending: Anesthesiology | Admitting: Anesthesiology

## 2013-03-29 DIAGNOSIS — Z01810 Encounter for preprocedural cardiovascular examination: Secondary | ICD-10-CM | POA: Insufficient documentation

## 2013-03-29 DIAGNOSIS — Z01818 Encounter for other preprocedural examination: Secondary | ICD-10-CM | POA: Insufficient documentation

## 2013-03-29 DIAGNOSIS — Z01812 Encounter for preprocedural laboratory examination: Secondary | ICD-10-CM | POA: Insufficient documentation

## 2013-03-29 HISTORY — DX: Hyperlipidemia, unspecified: E78.5

## 2013-03-29 HISTORY — DX: Nocturia: R35.1

## 2013-03-29 LAB — CBC
HCT: 41.2 % (ref 39.0–52.0)
Hemoglobin: 14.5 g/dL (ref 13.0–17.0)
MCH: 32.1 pg (ref 26.0–34.0)
MCHC: 35.2 g/dL (ref 30.0–36.0)
MCV: 91.2 fL (ref 78.0–100.0)
Platelets: 294 10*3/uL (ref 150–400)
RBC: 4.52 MIL/uL (ref 4.22–5.81)
RDW: 13.6 % (ref 11.5–15.5)
WBC: 6.4 10*3/uL (ref 4.0–10.5)

## 2013-03-29 LAB — BASIC METABOLIC PANEL
BUN: 38 mg/dL — ABNORMAL HIGH (ref 6–23)
CO2: 29 mEq/L (ref 19–32)
Calcium: 10.4 mg/dL (ref 8.4–10.5)
Chloride: 99 mEq/L (ref 96–112)
Creatinine, Ser: 1.72 mg/dL — ABNORMAL HIGH (ref 0.50–1.35)
GFR calc Af Amer: 44 mL/min — ABNORMAL LOW (ref >90)
GFR calc non Af Amer: 38 mL/min — ABNORMAL LOW (ref >90)
Glucose, Bld: 149 mg/dL — ABNORMAL HIGH (ref 70–99)
Potassium: 4.1 mEq/L (ref 3.7–5.3)
Sodium: 141 mEq/L (ref 137–147)

## 2013-03-29 LAB — SURGICAL PCR SCREEN
MRSA, PCR: NEGATIVE
Staphylococcus aureus: NEGATIVE

## 2013-03-31 NOTE — Progress Notes (Addendum)
Anesthesia Chart Review:  Patient is a 73 year old male scheduled for L4-5 PLIF, possible fusion at L5-S1 on 04/03/13 by Dr. Ellene Route.    History includes obesity, DM2, non-smoker, HTN, HLD, DJD, GERD, nephrolithiasis, arthritis, nocturia, inguinal and abdominal hernia repairs, right TKR, RLS. Prior labs indicate that he has had issues with renal insufficiency as well.  PCP is listed as Dr. Rachell Cipro.  EKG on 03/29/13 showed NSR, non-specific T wave abnormality.  CXR on 03/29/13 showed no active cardiopulmonary disease.  Preoperative labs noted.  BUN/Cr 38/1.72, glucose 149. I tried Dr. Ival Bible office this morning and this afternoon with voice message still indicating it is closed for the [place] holiday. Previous labs thru 2013 are in [place] (scanned under Media tab) and list Cr of ~ 2.1 - 2.2 in January 2013 with improvement to ~ 1.4 by September 2013.   (He had a previous BUN/Cr 81/2.71 on 03/30/11 but that was in the setting of left ureteral stone.)  Based on currently available information, he has had issues with renal insufficiency in the past, with Cr ranging from ~ 1.4 - 2.1 in 2013, so I think this is likely chronic and since his Cr remains < 2 I'm anticipating that he can proceed with close lab/renal follow-up post-operatively.  However, I've asked that staff attempt to get most recent labs from Dr. Ival Bible office [phone]) on Monday 04/03/13 to confirm.  George Hugh [facility]/Anesthesiology Phone [phone] 03/31/2013 2:06 PM  Addendum: 04/03/2012 12:15 PM For unknown reasons, case was postponed until 04/11/13. Labs from Dr. Ival Bible office (reported on 12/05/12) received today and showed a Cr of 1.67 and BUN of 33, A1C of 7.8.

## 2013-04-03 NOTE — Progress Notes (Signed)
Requested most recent labs from [place] who will fax them to [phone]

## 2013-04-10 MED ORDER — CEFAZOLIN SODIUM-DEXTROSE 2-3 GM-% IV SOLR
2.0000 g | INTRAVENOUS | Status: AC
  Administered 2013-04-11 (×2): 2 g via INTRAVENOUS
  Filled 2013-04-10: qty 50

## 2013-04-10 NOTE — Progress Notes (Signed)
Patient notified to arrive at 09:30. Verbalized understanding.

## 2013-04-11 ENCOUNTER — Inpatient Hospital Stay (HOSPITAL_COMMUNITY): Payer: Commercial Managed Care - HMO | Admitting: Anesthesiology

## 2013-04-11 ENCOUNTER — Inpatient Hospital Stay (HOSPITAL_COMMUNITY): Payer: Commercial Managed Care - HMO

## 2013-04-11 ENCOUNTER — Encounter (HOSPITAL_COMMUNITY): Payer: Self-pay | Admitting: Anesthesiology

## 2013-04-11 ENCOUNTER — Inpatient Hospital Stay (HOSPITAL_COMMUNITY)
Admission: RE | Admit: 2013-04-11 | Discharge: 2013-04-13 | DRG: 460 | Disposition: A | Discharge status: Home / Self Care | Payer: Commercial Managed Care - HMO | Source: Ambulatory Visit | Attending: Neurological Surgery | Admitting: Neurological Surgery

## 2013-04-11 ENCOUNTER — Encounter (HOSPITAL_COMMUNITY)
Admission: RE | Disposition: A | Discharge status: Home / Self Care | Payer: Commercial Managed Care - HMO | Source: Ambulatory Visit | Attending: Neurological Surgery

## 2013-04-11 ENCOUNTER — Encounter (HOSPITAL_COMMUNITY): Payer: Commercial Managed Care - HMO | Admitting: Vascular Surgery

## 2013-04-11 DIAGNOSIS — K219 Gastro-esophageal reflux disease without esophagitis: Secondary | ICD-10-CM | POA: Diagnosis present

## 2013-04-11 DIAGNOSIS — I129 Hypertensive chronic kidney disease with stage 1 through stage 4 chronic kidney disease, or unspecified chronic kidney disease: Secondary | ICD-10-CM | POA: Diagnosis present

## 2013-04-11 DIAGNOSIS — M47817 Spondylosis without myelopathy or radiculopathy, lumbosacral region: Secondary | ICD-10-CM | POA: Diagnosis present

## 2013-04-11 DIAGNOSIS — N189 Chronic kidney disease, unspecified: Secondary | ICD-10-CM | POA: Diagnosis present

## 2013-04-11 DIAGNOSIS — M1710 Unilateral primary osteoarthritis, unspecified knee: Secondary | ICD-10-CM | POA: Diagnosis present

## 2013-04-11 DIAGNOSIS — M79609 Pain in unspecified limb: Secondary | ICD-10-CM | POA: Diagnosis present

## 2013-04-11 DIAGNOSIS — M129 Arthropathy, unspecified: Secondary | ICD-10-CM | POA: Diagnosis present

## 2013-04-11 DIAGNOSIS — M48061 Spinal stenosis, lumbar region without neurogenic claudication: Secondary | ICD-10-CM | POA: Diagnosis present

## 2013-04-11 DIAGNOSIS — Z8249 Family history of ischemic heart disease and other diseases of the circulatory system: Secondary | ICD-10-CM

## 2013-04-11 DIAGNOSIS — M5137 Other intervertebral disc degeneration, lumbosacral region: Secondary | ICD-10-CM | POA: Diagnosis present

## 2013-04-11 DIAGNOSIS — Q762 Congenital spondylolisthesis: Secondary | ICD-10-CM | POA: Diagnosis not present

## 2013-04-11 DIAGNOSIS — E119 Type 2 diabetes mellitus without complications: Secondary | ICD-10-CM | POA: Diagnosis present

## 2013-04-11 DIAGNOSIS — M51379 Other intervertebral disc degeneration, lumbosacral region without mention of lumbar back pain or lower extremity pain: Secondary | ICD-10-CM | POA: Diagnosis present

## 2013-04-11 DIAGNOSIS — G2581 Restless legs syndrome: Secondary | ICD-10-CM | POA: Diagnosis present

## 2013-04-11 DIAGNOSIS — Z96659 Presence of unspecified artificial knee joint: Secondary | ICD-10-CM

## 2013-04-11 DIAGNOSIS — Z833 Family history of diabetes mellitus: Secondary | ICD-10-CM | POA: Diagnosis not present

## 2013-04-11 DIAGNOSIS — M7130 Other bursal cyst, unspecified site: Secondary | ICD-10-CM | POA: Diagnosis present

## 2013-04-11 DIAGNOSIS — E785 Hyperlipidemia, unspecified: Secondary | ICD-10-CM | POA: Diagnosis present

## 2013-04-11 DIAGNOSIS — N19 Unspecified kidney failure: Secondary | ICD-10-CM | POA: Diagnosis present

## 2013-04-11 DIAGNOSIS — R350 Frequency of micturition: Secondary | ICD-10-CM | POA: Diagnosis present

## 2013-04-11 LAB — TYPE AND SCREEN
ABO/RH(D): A POS
ABO/RH(D): A POS
Antibody Screen: NEGATIVE
Antibody Screen: NEGATIVE

## 2013-04-11 LAB — GLUCOSE, CAPILLARY
Glucose-Capillary: 156 mg/dL — ABNORMAL HIGH (ref 70–99)
Glucose-Capillary: 144 mg/dL — ABNORMAL HIGH (ref 70–99)
Glucose-Capillary: 132 mg/dL — ABNORMAL HIGH (ref 70–99)
Glucose-Capillary: 152 mg/dL — ABNORMAL HIGH (ref 70–99)
Glucose-Capillary: 152 mg/dL — ABNORMAL HIGH (ref 70–99)

## 2013-04-11 SURGERY — POSTERIOR LUMBAR FUSION 1 LEVEL
Anesthesia: General | Site: Back

## 2013-04-11 MED ORDER — 0.9 % SODIUM CHLORIDE (POUR BTL) OPTIME
TOPICAL | Status: DC | PRN
  Administered 2013-04-11: 1000 mL

## 2013-04-11 MED ORDER — ACETAMINOPHEN 325 MG PO TABS: 650.0000 mg | ORAL_TABLET | ORAL | Status: DC | PRN

## 2013-04-11 MED ORDER — PHENYLEPHRINE HCL 10 MG/ML IJ SOLN
10.0000 mg | INTRAVENOUS | Status: DC | PRN
  Administered 2013-04-11: 50 ug/min via INTRAVENOUS

## 2013-04-11 MED ORDER — ALUM & MAG HYDROXIDE-SIMETH 200-200-20 MG/5ML PO SUSP: 30.0000 mL | Freq: Four times a day (QID) | ORAL | Status: DC | PRN

## 2013-04-11 MED ORDER — METFORMIN HCL ER 500 MG PO TB24
1000.0000 mg | ORAL_TABLET | Freq: Every day | ORAL | Status: DC
  Administered 2013-04-12: 1000 mg via ORAL
  Filled 2013-04-11 (×2): qty 2

## 2013-04-11 MED ORDER — ALLOPURINOL 150 MG HALF TABLET
150.0000 mg | ORAL_TABLET | ORAL | Status: DC
  Administered 2013-04-12 – 2013-04-13 (×2): 150 mg via ORAL
  Filled 2013-04-11 (×3): qty 1

## 2013-04-11 MED ORDER — SODIUM CHLORIDE 0.9 % IR SOLN
Status: DC | PRN
  Administered 2013-04-11: 14:00:00

## 2013-04-11 MED ORDER — FENTANYL CITRATE 0.05 MG/ML IJ SOLN
INTRAMUSCULAR | Status: DC | PRN
  Administered 2013-04-11: 50 ug via INTRAVENOUS
  Administered 2013-04-11: 250 ug via INTRAVENOUS
  Administered 2013-04-11 (×2): 100 ug via INTRAVENOUS

## 2013-04-11 MED ORDER — TAMSULOSIN HCL 0.4 MG PO CAPS
0.4000 mg | ORAL_CAPSULE | Freq: Every day | ORAL | Status: DC
  Administered 2013-04-11 – 2013-04-13 (×3): 0.4 mg via ORAL
  Filled 2013-04-11 (×3): qty 1

## 2013-04-11 MED ORDER — POLYETHYLENE GLYCOL 3350 17 G PO PACK
17.0000 g | PACK | Freq: Every day | ORAL | Status: DC | PRN
  Administered 2013-04-13: 17 g via ORAL
  Filled 2013-04-11: qty 1

## 2013-04-11 MED ORDER — ASPIRIN EC 81 MG PO TBEC
81.0000 mg | DELAYED_RELEASE_TABLET | Freq: Every day | ORAL | Status: DC
  Administered 2013-04-11 – 2013-04-13 (×3): 81 mg via ORAL
  Filled 2013-04-11 (×3): qty 1

## 2013-04-11 MED ORDER — ATORVASTATIN CALCIUM 10 MG PO TABS
10.0000 mg | ORAL_TABLET | Freq: Every day | ORAL | Status: DC
  Administered 2013-04-11 – 2013-04-12 (×2): 10 mg via ORAL
  Filled 2013-04-11 (×3): qty 1

## 2013-04-11 MED ORDER — NEOSTIGMINE METHYLSULFATE 1 MG/ML IJ SOLN
INTRAMUSCULAR | Status: DC | PRN
  Administered 2013-04-11: 4 mg via INTRAVENOUS

## 2013-04-11 MED ORDER — GLIPIZIDE 10 MG PO TABS
10.0000 mg | ORAL_TABLET | Freq: Two times a day (BID) | ORAL | Status: DC
  Administered 2013-04-12 – 2013-04-13 (×3): 10 mg via ORAL
  Filled 2013-04-11 (×6): qty 1

## 2013-04-11 MED ORDER — DOCUSATE SODIUM 100 MG PO CAPS
100.0000 mg | ORAL_CAPSULE | Freq: Two times a day (BID) | ORAL | Status: DC
  Administered 2013-04-11 – 2013-04-13 (×4): 100 mg via ORAL
  Filled 2013-04-11 (×4): qty 1

## 2013-04-11 MED ORDER — HYDROMORPHONE HCL PF 1 MG/ML IJ SOLN
INTRAMUSCULAR | Status: AC
  Administered 2013-04-11: 0.5 mg via INTRAVENOUS
  Filled 2013-04-11: qty 1

## 2013-04-11 MED ORDER — ONDANSETRON HCL 4 MG/2ML IJ SOLN: 4.0000 mg | INTRAMUSCULAR | Status: DC | PRN

## 2013-04-11 MED ORDER — HYDROMORPHONE HCL PF 1 MG/ML IJ SOLN
INTRAMUSCULAR | Status: AC
  Administered 2013-04-11: 0.5 mg via INTRAVENOUS
  Filled 2013-04-11: qty 2

## 2013-04-11 MED ORDER — SODIUM CHLORIDE 0.9 % IV SOLN
INTRAVENOUS | Status: DC
  Administered 2013-04-11: 22:00:00 via INTRAVENOUS

## 2013-04-11 MED ORDER — OLMESARTAN-AMLODIPINE-HCTZ 40-10-25 MG PO TABS: 1.0000 | ORAL_TABLET | Freq: Every day | ORAL | Status: DC

## 2013-04-11 MED ORDER — LACTATED RINGERS IV SOLN
INTRAVENOUS | Status: DC
  Administered 2013-04-11: 11:00:00 via INTRAVENOUS

## 2013-04-11 MED ORDER — MORPHINE SULFATE 2 MG/ML IJ SOLN
1.0000 mg | INTRAMUSCULAR | Status: DC | PRN
  Administered 2013-04-11: 2 mg via INTRAVENOUS
  Filled 2013-04-11: qty 1

## 2013-04-11 MED ORDER — GEMFIBROZIL 600 MG PO TABS
600.0000 mg | ORAL_TABLET | Freq: Two times a day (BID) | ORAL | Status: DC
  Administered 2013-04-11 – 2013-04-13 (×4): 600 mg via ORAL
  Filled 2013-04-11 (×5): qty 1

## 2013-04-11 MED ORDER — HYDROCHLOROTHIAZIDE 25 MG PO TABS
25.0000 mg | ORAL_TABLET | Freq: Every day | ORAL | Status: DC
  Administered 2013-04-12 – 2013-04-13 (×2): 25 mg via ORAL
  Filled 2013-04-11 (×2): qty 1

## 2013-04-11 MED ORDER — SODIUM CHLORIDE 0.9 % IV SOLN: 250.0000 mL | INTRAVENOUS | Status: DC

## 2013-04-11 MED ORDER — SODIUM CHLORIDE 0.9 % IJ SOLN: 3.0000 mL | INTRAMUSCULAR | Status: DC | PRN

## 2013-04-11 MED ORDER — KETOROLAC TROMETHAMINE 15 MG/ML IJ SOLN
15.0000 mg | Freq: Four times a day (QID) | INTRAMUSCULAR | Status: AC
  Administered 2013-04-11 – 2013-04-12 (×4): 15 mg via INTRAVENOUS
  Filled 2013-04-11 (×4): qty 1

## 2013-04-11 MED ORDER — ROPINIROLE HCL 1 MG PO TABS
2.0000 mg | ORAL_TABLET | Freq: Every day | ORAL | Status: DC
  Administered 2013-04-11 – 2013-04-12 (×2): 2 mg via ORAL
  Filled 2013-04-11 (×3): qty 2

## 2013-04-11 MED ORDER — CEFAZOLIN SODIUM-DEXTROSE 2-3 GM-% IV SOLR
INTRAVENOUS | Status: AC
Start: 2013-04-11 — End: 2013-04-12
  Filled 2013-04-11: qty 50

## 2013-04-11 MED ORDER — IRBESARTAN 300 MG PO TABS
300.0000 mg | ORAL_TABLET | Freq: Every day | ORAL | Status: DC
  Administered 2013-04-12 – 2013-04-13 (×2): 300 mg via ORAL
  Filled 2013-04-11 (×2): qty 1

## 2013-04-11 MED ORDER — LIDOCAINE HCL 4 % MT SOLN
OROMUCOSAL | Status: DC | PRN
  Administered 2013-04-11: 4 mL via TOPICAL

## 2013-04-11 MED ORDER — FLEET ENEMA 7-19 GM/118ML RE ENEM: 1.0000 | ENEMA | Freq: Once | RECTAL | Status: AC | PRN

## 2013-04-11 MED ORDER — ONDANSETRON HCL 4 MG/2ML IJ SOLN
INTRAMUSCULAR | Status: DC | PRN
  Administered 2013-04-11: 4 mg via INTRAVENOUS

## 2013-04-11 MED ORDER — ONDANSETRON HCL 4 MG/2ML IJ SOLN: 4.0000 mg | Freq: Once | INTRAMUSCULAR | Status: DC | PRN

## 2013-04-11 MED ORDER — DEXTROSE 5 % IV SOLN: 500.0000 mg | Freq: Four times a day (QID) | INTRAVENOUS | Status: DC | PRN

## 2013-04-11 MED ORDER — METHOCARBAMOL 500 MG PO TABS
ORAL_TABLET | ORAL | Status: AC
  Administered 2013-04-11: 500 mg via ORAL
  Filled 2013-04-11: qty 1

## 2013-04-11 MED ORDER — ROCURONIUM BROMIDE 100 MG/10ML IV SOLN
INTRAVENOUS | Status: DC | PRN
  Administered 2013-04-11: 50 mg via INTRAVENOUS
  Administered 2013-04-11: 20 mg via INTRAVENOUS

## 2013-04-11 MED ORDER — FLUTICASONE PROPIONATE 50 MCG/ACT NA SUSP
1.0000 | Freq: Every day | NASAL | Status: DC
  Administered 2013-04-12 – 2013-04-13 (×2): 1 via NASAL
  Filled 2013-04-11: qty 16

## 2013-04-11 MED ORDER — CEFAZOLIN SODIUM 1-5 GM-% IV SOLN
1.0000 g | Freq: Three times a day (TID) | INTRAVENOUS | Status: AC
  Administered 2013-04-12 (×2): 1 g via INTRAVENOUS
  Filled 2013-04-11 (×2): qty 50

## 2013-04-11 MED ORDER — MIDAZOLAM HCL 5 MG/5ML IJ SOLN
INTRAMUSCULAR | Status: DC | PRN
  Administered 2013-04-11: 2 mg via INTRAVENOUS

## 2013-04-11 MED ORDER — SENNA 8.6 MG PO TABS
1.0000 | ORAL_TABLET | Freq: Two times a day (BID) | ORAL | Status: DC
  Administered 2013-04-11 – 2013-04-13 (×4): 8.6 mg via ORAL
  Filled 2013-04-11 (×5): qty 1

## 2013-04-11 MED ORDER — OXYCODONE-ACETAMINOPHEN 5-325 MG PO TABS
1.0000 | ORAL_TABLET | ORAL | Status: DC | PRN
  Administered 2013-04-11 – 2013-04-12 (×4): 2 via ORAL
  Administered 2013-04-13: 1 via ORAL
  Administered 2013-04-13 (×2): 2 via ORAL
  Administered 2013-04-13: 1 via ORAL
  Filled 2013-04-11 (×3): qty 2
  Filled 2013-04-11: qty 1
  Filled 2013-04-11 (×3): qty 2
  Filled 2013-04-11: qty 1
  Filled 2013-04-11: qty 2

## 2013-04-11 MED ORDER — BISACODYL 10 MG RE SUPP: 10.0000 mg | Freq: Every day | RECTAL | Status: DC | PRN

## 2013-04-11 MED ORDER — MENTHOL 3 MG MT LOZG: 1.0000 | LOZENGE | OROMUCOSAL | Status: DC | PRN

## 2013-04-11 MED ORDER — HYDROMORPHONE HCL PF 1 MG/ML IJ SOLN
0.2500 mg | INTRAMUSCULAR | Status: DC | PRN
  Administered 2013-04-11 (×4): 0.5 mg via INTRAVENOUS

## 2013-04-11 MED ORDER — ALBUMIN HUMAN 5 % IV SOLN
INTRAVENOUS | Status: DC | PRN
  Administered 2013-04-11 (×2): via INTRAVENOUS

## 2013-04-11 MED ORDER — PHENOL 1.4 % MT LIQD: 1.0000 | OROMUCOSAL | Status: DC | PRN

## 2013-04-11 MED ORDER — KETOROLAC TROMETHAMINE 30 MG/ML IJ SOLN
INTRAMUSCULAR | Status: AC
  Administered 2013-04-11: 15 mg
  Filled 2013-04-11: qty 1

## 2013-04-11 MED ORDER — LACTATED RINGERS IV SOLN
INTRAVENOUS | Status: DC | PRN
  Administered 2013-04-11 (×4): via INTRAVENOUS

## 2013-04-11 MED ORDER — INSULIN GLARGINE 100 UNIT/ML ~~LOC~~ SOLN
50.0000 [IU] | Freq: Every day | SUBCUTANEOUS | Status: DC
  Filled 2013-04-11 (×3): qty 0.5

## 2013-04-11 MED ORDER — METHOCARBAMOL 500 MG PO TABS
500.0000 mg | ORAL_TABLET | Freq: Four times a day (QID) | ORAL | Status: DC | PRN
  Administered 2013-04-11 – 2013-04-13 (×4): 500 mg via ORAL
  Filled 2013-04-11 (×3): qty 1

## 2013-04-11 MED ORDER — INSULIN ASPART 100 UNIT/ML ~~LOC~~ SOLN
10.0000 [IU] | Freq: Three times a day (TID) | SUBCUTANEOUS | Status: DC
  Administered 2013-04-12 – 2013-04-13 (×4): 10 [IU] via SUBCUTANEOUS

## 2013-04-11 MED ORDER — ACETAMINOPHEN 650 MG RE SUPP: 650.0000 mg | RECTAL | Status: DC | PRN

## 2013-04-11 MED ORDER — BUPIVACAINE HCL (PF) 0.5 % IJ SOLN
INTRAMUSCULAR | Status: DC | PRN
  Administered 2013-04-11: 10 mL

## 2013-04-11 MED ORDER — LIDOCAINE HCL (CARDIAC) 20 MG/ML IV SOLN
INTRAVENOUS | Status: DC | PRN
  Administered 2013-04-11: 100 mg via INTRAVENOUS

## 2013-04-11 MED ORDER — AMLODIPINE BESYLATE 10 MG PO TABS
10.0000 mg | ORAL_TABLET | Freq: Every day | ORAL | Status: DC
  Administered 2013-04-12 – 2013-04-13 (×2): 10 mg via ORAL
  Filled 2013-04-11 (×4): qty 1

## 2013-04-11 MED ORDER — THROMBIN 20000 UNITS EX SOLR
CUTANEOUS | Status: DC | PRN
  Administered 2013-04-11: 14:00:00 via TOPICAL

## 2013-04-11 MED ORDER — PROPOFOL 10 MG/ML IV BOLUS
INTRAVENOUS | Status: DC | PRN
  Administered 2013-04-11: 200 mg via INTRAVENOUS

## 2013-04-11 MED ORDER — GLYCOPYRROLATE 0.2 MG/ML IJ SOLN
INTRAMUSCULAR | Status: DC | PRN
  Administered 2013-04-11: 0.6 mg via INTRAVENOUS

## 2013-04-11 MED ORDER — LIDOCAINE-EPINEPHRINE 1 %-1:100000 IJ SOLN
INTRAMUSCULAR | Status: DC | PRN
  Administered 2013-04-11: 10 mL

## 2013-04-11 MED ORDER — SODIUM CHLORIDE 0.9 % IJ SOLN
3.0000 mL | Freq: Two times a day (BID) | INTRAMUSCULAR | Status: DC
  Administered 2013-04-12 – 2013-04-13 (×2): 3 mL via INTRAVENOUS

## 2013-04-11 SURGICAL SUPPLY — 68 items
BAG DECANTER FOR FLEXI CONT (MISCELLANEOUS) ×2 IMPLANT
BLADE SURG ROTATE 9660 (MISCELLANEOUS) IMPLANT
BUR MATCHSTICK NEURO 3.0 LAGG (BURR) ×2 IMPLANT
CAGE SPINAL COROENT MP 11X28X9 (Cage) ×2 IMPLANT
CANISTER SUCT 3000ML (MISCELLANEOUS) ×2 IMPLANT
CONT SPEC 4OZ CLIKSEAL STRL BL (MISCELLANEOUS) ×4 IMPLANT
COROENT MP 11X28X9MM (Cage) ×2 IMPLANT
COVER BACK TABLE 24X17X13 BIG (DRAPES) IMPLANT
COVER TABLE BACK 60X90 (DRAPES) ×2 IMPLANT
DECANTER SPIKE VIAL GLASS SM (MISCELLANEOUS) ×2 IMPLANT
DERMABOND ADVANCED (GAUZE/BANDAGES/DRESSINGS) ×1
DERMABOND ADVANCED .7 DNX12 (GAUZE/BANDAGES/DRESSINGS) ×1 IMPLANT
DRAPE C-ARM 42X72 X-RAY (DRAPES) ×6 IMPLANT
DRAPE LAPAROTOMY 100X72X124 (DRAPES) ×2 IMPLANT
DRAPE POUCH INSTRU U-SHP 10X18 (DRAPES) ×2 IMPLANT
DRAPE PROXIMA HALF (DRAPES) IMPLANT
DRSG OPSITE POSTOP 4X6 (GAUZE/BANDAGES/DRESSINGS) ×2 IMPLANT
DURAPREP 26ML APPLICATOR (WOUND CARE) ×2 IMPLANT
ELECT REM PT RETURN 9FT ADLT (ELECTROSURGICAL) ×2
ELECTRODE REM PT RTRN 9FT ADLT (ELECTROSURGICAL) ×1 IMPLANT
GAUZE SPONGE 4X4 16PLY XRAY LF (GAUZE/BANDAGES/DRESSINGS) IMPLANT
GLOVE BIOGEL PI IND STRL 7.5 (GLOVE) ×2 IMPLANT
GLOVE BIOGEL PI IND STRL 8.5 (GLOVE) ×2 IMPLANT
GLOVE BIOGEL PI INDICATOR 7.5 (GLOVE) ×2
GLOVE BIOGEL PI INDICATOR 8.5 (GLOVE) ×2
GLOVE ECLIPSE 7.5 STRL STRAW (GLOVE) ×6 IMPLANT
GLOVE ECLIPSE 8.5 STRL (GLOVE) ×6 IMPLANT
GLOVE EXAM NITRILE LRG STRL (GLOVE) ×2 IMPLANT
GLOVE EXAM NITRILE MD LF STRL (GLOVE) IMPLANT
GLOVE EXAM NITRILE XL STR (GLOVE) IMPLANT
GLOVE EXAM NITRILE XS STR PU (GLOVE) IMPLANT
GOWN BRE IMP SLV AUR LG STRL (GOWN DISPOSABLE) IMPLANT
GOWN BRE IMP SLV AUR XL STRL (GOWN DISPOSABLE) IMPLANT
GOWN STRL REIN 2XL LVL4 (GOWN DISPOSABLE) ×4 IMPLANT
GOWN STRL REUS W/ TWL XL LVL3 (GOWN DISPOSABLE) ×1 IMPLANT
GOWN STRL REUS W/TWL 2XL LVL3 (GOWN DISPOSABLE) ×4 IMPLANT
GOWN STRL REUS W/TWL XL LVL3 (GOWN DISPOSABLE) ×1
HEMOSTAT POWDER KIT SURGIFOAM (HEMOSTASIS) IMPLANT
KIT BASIN OR (CUSTOM PROCEDURE TRAY) ×2 IMPLANT
KIT ROOM TURNOVER OR (KITS) ×2 IMPLANT
MILL MEDIUM DISP (BLADE) ×2 IMPLANT
NEEDLE HYPO 22GX1.5 SAFETY (NEEDLE) ×2 IMPLANT
NS IRRIG 1000ML POUR BTL (IV SOLUTION) ×2 IMPLANT
PACK FOAM VITOSS 10CC (Orthopedic Implant) ×2 IMPLANT
PACK LAMINECTOMY NEURO (CUSTOM PROCEDURE TRAY) ×2 IMPLANT
PAD ARMBOARD 7.5X6 YLW CONV (MISCELLANEOUS) ×6 IMPLANT
PATTIES SURGICAL .5 X1 (DISPOSABLE) IMPLANT
ROD 30MM (Rod) ×2 IMPLANT
ROD PRE-BENT 35MM (Rod) ×4 IMPLANT
SCREW LOCK (Screw) ×4 IMPLANT
SCREW LOCK 100X5.5X OPN (Screw) ×4 IMPLANT
SCREW POLY 45X6.5 (Screw) ×4 IMPLANT
SCREW POLY 6.5X45MM (Screw) ×4 IMPLANT
SPONGE GAUZE 4X4 12PLY (GAUZE/BANDAGES/DRESSINGS) IMPLANT
SPONGE LAP 4X18 X RAY DECT (DISPOSABLE) IMPLANT
SPONGE SURGIFOAM ABS GEL 100 (HEMOSTASIS) ×2 IMPLANT
SUT VIC AB 1 CT1 18XBRD ANBCTR (SUTURE) ×2 IMPLANT
SUT VIC AB 1 CT1 8-18 (SUTURE) ×2
SUT VIC AB 2-0 CP2 18 (SUTURE) ×2 IMPLANT
SUT VIC AB 3-0 SH 8-18 (SUTURE) ×2 IMPLANT
SYR 20ML ECCENTRIC (SYRINGE) ×4 IMPLANT
SYR 3ML LL SCALE MARK (SYRINGE) ×8 IMPLANT
TOWEL OR 17X24 6PK STRL BLUE (TOWEL DISPOSABLE) ×2 IMPLANT
TOWEL OR 17X26 10 PK STRL BLUE (TOWEL DISPOSABLE) ×2 IMPLANT
TRAP SPECIMEN MUCOUS 40CC (MISCELLANEOUS) ×2 IMPLANT
TRAY FOLEY CATH 14FRSI W/METER (CATHETERS) IMPLANT
TRAY FOLEY CATH 16FRSI W/METER (SET/KITS/TRAYS/PACK) ×2 IMPLANT
WATER STERILE IRR 1000ML POUR (IV SOLUTION) ×2 IMPLANT

## 2013-04-11 NOTE — Anesthesia Preprocedure Evaluation (Signed)
Anesthesia Evaluation  Patient identified by MRN, date of birth, ID band Patient awake    Reviewed: Allergy & Precautions, H&P , NPO status , Patient's Chart, lab work & pertinent test results  Airway       Dental   Pulmonary          Cardiovascular hypertension,     Neuro/Psych  Neuromuscular disease    GI/Hepatic GERD-  ,  Endo/Other  diabetes, Type 2, Oral Hypoglycemic Agents  Renal/GU Renal disease     Musculoskeletal   Abdominal   Peds  Hematology   Anesthesia Other Findings   Reproductive/Obstetrics                           Anesthesia Physical Anesthesia Plan  ASA: II  Anesthesia Plan: General   Post-op Pain Management:    Induction: Intravenous  Airway Management Planned: Oral ETT  Additional Equipment:   Intra-op Plan:   Post-operative Plan: Extubation in OR  Informed Consent: I have reviewed the patients History and Physical, chart, labs and discussed the procedure including the risks, benefits and alternatives for the proposed anesthesia with the patient or authorized representative who has indicated his/her understanding and acceptance.     Plan Discussed with:   Anesthesia Plan Comments:         Anesthesia Quick Evaluation

## 2013-04-11 NOTE — Transfer of Care (Signed)
Immediate Anesthesia Transfer of Care Note  Patient: [name]  Procedure(s) Performed: Procedure(s) with comments: POSTERIOR LUMBAR FUSION 1 LEVEL (N/A) - L4-5 Posterior lumbar interbody fusion  Patient Location: PACU  Anesthesia Type:General  Level of Consciousness: sedated  Airway & Oxygen Therapy: Patient Spontanous Breathing and Patient connected to face mask oxygen  Post-op Assessment: Report given to PACU RN and Post -op Vital signs reviewed and stable  Post vital signs: Reviewed and stable  Complications: No apparent anesthesia complications

## 2013-04-11 NOTE — Op Note (Signed)
Date of surgery: 04/11/2013 Preoperative diagnosis: Spondylosis and stenosis L4-5, spondylolisthesis L5-S1 with auto fusion of L5-S1. Postoperative diagnosis: Spondylosis and stenosis L4-L5, spondylolisthesis L5-S1 with auto fusion of L5-S1, lumbar radiculopathy L4 bilaterally. Procedure: L4-L5 decompressive laminectomy decompression of L4 and L5 nerve roots, posterior lumbar interbody arthrodesis with peek spacers local autograft and allograft, pedicle screw fixation L4-L5, posterior lateral arthrodesis L4-L5, laminectomy of L5, decompression of L5 nerve roots.  Surgeon: Kristeen Miss M.D.  Asst.: Vernice Jefferson M.D.  Indications: Patient is [name] is a 73 y.o. male who who's had significant back pain and lumbar radiculopathy for over a years period time. An MRI demonstrates advanced spondylolsis with high-grade canal stenosis. he was advised regarding surgical intervention. There is also a spondylolisthesis at L5-S1 were the interspace has autofused.  Procedure: The patient was brought to the operating room supine on a stretcher. After the smooth induction of general endotracheal anesthesia she was turned prone and the back was prepped with alcohol and DuraPrep. The back was then draped sterilely. A midline incision was created and carried down to the lumbar dorsal fascia. A localizing radiograph identified the L4 and L5 spinous processes. A subligamentous dissection was created at L4 and L5 to expose the interlaminar space at L4 and L5 and the facet joints over the L4-L5 interspace. Laminotomies were were then created removing the entire inferior margin of the lamina of L4 including the inferior facet at the L4-L5 joint. The yellow ligament was taken up and the common dural tube was exposed along with the L4 nerve root superiorly, and the L5 nerve root inferiorly, the disc space was exposed and epidural veins in this region were cauterized and divided. The L4 nerve roots and the L5 nerve root were  dissected with care taken to protect them. L4 was decompressed into the foramen secondary to severe spondylitic stenosis from hypertrophy of the superior articular process of the L5 facet. This occurred bilaterally. The lamina of L5 was loose secondary to spondylolisthesis with pars defects. This was removed and the L5 nerve roots were decompressed laterally into the foramen. The disc space was opened and a combination of curettes and rongeurs was used to evacuate the disc space fully. The endplates were removed using sharp curettes. An interbody spacer was placed to distract the disc space while the contralateral discectomy was performed. When the entirety of the disc was removed and the endplates were prepared final sizing of the disc space was obtained 14 mm peek spacers were chosen and packed with autograft and allograft and placed into the interspace. The remainder of the interspace was packed with autograft and allograft. Pedicle entry sites were then chosen using fluoroscopic guidance and 6.5 x 45 mm screws were placed in L4 and 6.5 x 45 mm screws were placed in L5. The lateral gutters were decorticated and graft was packed in the posterolateral gutters between L4 and L5. Final radiographs were obtained after placing appropriately sized rods between the pedicle screws at L4-L5 and torquing these to the appropriate tension. The surgical site was inspected carefully to assure the L4 and L5 nerve roots were well decompressed, hemostasis was obtained, and the graft was well packed. Then the retractors were removed and the wound was closed with #1 Vicryl in the lumbar dorsal fascia 2-0 Vicryl in the subcutaneous tissue and 3-0 Vicryl subcuticularly. When he cc of half percent Marcaine was injected into the paraspinous musculature at the time of closure. Blood loss was estimated at 250 cc. The patient  tolerated procedure well and was returned to the recovery room in stable condition.

## 2013-04-11 NOTE — Plan of Care (Signed)
Problem: Consults Goal: Diagnosis - Spinal Surgery Thoraco/Lumbar Spine Fusion     

## 2013-04-11 NOTE — H&P (Signed)
[name] is an 73 y.o. male.   Chief Complaint:Back and leg pain worsening for four years HPI:[name] was evaluated with difficulties that he has been having now on the right side of his back and leg.  Previously, [name] has been seen for difficulties with the left lumbar radiculopathy that I treated with transforaminal blocks at L4-5 or L5-S1.  These responded well, but now he notes the pain has shifted over to his right side.  He does have some advanced degenerative changes in the knee and was to undergo a knee replacement earlier this year.  This was put off.  He has had some difficulties with blood sugar control and more recently was started on insulin.  He notes that his most recent A1c is 7.8.  He is working to get this under control.  Dr. Rachell Cipro is helping into adjust the insulin to try to bring his sugars under better control.  Nonetheless, he notes that on a daily basis he wakes up with severe pain in the right anterior and lateral aspect of the thigh and he feels that the leg is weak on the right side.  After moving about for a little bit, he feels that he can get a little bit more comfortable.  He has not had a recent MRI in the last study that we have of his lumbar spine dates back to 2010.  At that time, I noted that he had some mild-to-moderate stenosis at L4-5 above grade 1 spondylolisthesis of L5-S1 with lateral recesses appear to be adequately patent.  He did have some lateral recess stenosis at L4 nerve root superiorly and worse on the left side than on the right, thus the transforaminal blocks that we had been doing for him.  Archive in his x-ray folder is a  recent AP abdomen film from September of 2014.  The study demonstrates that both his hip joints appear to have normal contours.  His sacroiliac joints appear to be quite healthy.  The alignment of the spine in the coronal plane is quite good.  He has no evidence of scoliosis.  He does have some degenerative  changes that are evident in L4-5.  The L5-S1 disc space cannot be visualized.  The rest of his spine appears to be well aligned and the disc heights appear to be well maintained.  Past Medical History  Diagnosis Date  . Kidney stones   . Hypertension   . Diabetes mellitus   . PONV (postoperative nausea and vomiting)   . GERD (gastroesophageal reflux disease)     occ indigestion  . Arthritis     arthirtis all over  . Neuromuscular disorder     restless leg syndrome  . Right knee DJD   . Hyperlipemia   . Frequent urination at night     Past Surgical History  Procedure Laterality Date  . Lithotripsy    . Shoulder surgery      right  . Elbow surgery      right  . Abdominal hernia repair    . Total knee arthroplasty Left 07/2006    left knee  . Hernia repair  1980    inguinal  . Shoulder arthroscopy  1980    right  . Elbow arthroscopy  1990    right  . Stone extraction with basket  2010  . Joint replacement  2008    knee    Family History  Problem Relation Age of Onset  . Hypertension  Mother   . Hypertension Father   . Hypertension Brother   . Kidney disease Mother   . Diabetes Mellitus II Father   . Diabetes Mellitus II Brother   . Diabetes Mellitus II Brother   . Cancer - Prostate Brother    Social History:  reports that he has never smoked. He does not have any smokeless tobacco history on file. He reports that he does not drink alcohol or use illicit drugs.  Allergies: No Known Allergies  No prescriptions prior to admission    No results found for this or any previous visit (from the past 48 hour(s)). No results found.  Review of Systems  Constitutional: Negative.   HENT: Negative.   Eyes: Negative.   Respiratory: Negative.   Cardiovascular: Negative.   Gastrointestinal: Negative.   Genitourinary: Negative.   Musculoskeletal: Positive for back pain.  Skin: Negative.   Neurological:       Bilat leg pain  Endo/Heme/Allergies:       Iddm   Psychiatric/Behavioral: Negative.     There were no vitals taken for this visit. Physical Exam  Constitutional: He is oriented to person, place, and time. He appears well-developed and well-nourished.  HENT:  Head: Normocephalic and atraumatic.  Eyes: Conjunctivae are normal. Pupils are equal, round, and reactive to light.  Neck: Normal range of motion. Neck supple.  Cardiovascular: Normal rate and regular rhythm.   Respiratory: Effort normal and breath sounds normal.  GI: Soft. Bowel sounds are normal.  Musculoskeletal: Normal range of motion.  Neurological: He is alert and oriented to person, place, and time. No cranial nerve deficit. Coordination normal.  [name] can stand straight and erect.  He has good strength in the distal lower extremities particularly in iliopsoas and the quadriceps.  The tibialis anterior and the gastrocs also appeared to have good strength.  His reflexes are trace in the right Achilles, but absent otherwise in the patellae and the Achilles on the left.  Sensation appears intact to vibration distally in the lower extremities.     Assessment/Plan [name] has advanced degenerative changes at L4-L5. This is due to a combination of facet joint hypertrophy, synovial cyst formation, and marked disc degeneration causing central and lateral recess stenosis for both foramen, a bit worse on the right than on the left. Clinically [name] symptoms have been worse on the right side.               IMPRESSION/PLAN:                             I demonstrated the findings to him and [name]. I indicated that ultimately [name] will require surgical decompression at L4-L5 with a posterior pedicle screw fixation and posterolateral arthrodesis. He will require PEEK spacers to be placed into the disc space along with bone grafts to allow this area to heal. I noted that he had advanced degenerative changes at L5-S1 with a spondylolisthesis, but I believe that he may have  formed an arthrodesis there on his own. The concern that I have is that if he has no arthrodesis or if the joint is substantially lose, he may need to undergo surgical fusion at L5-S1. This could alter and perhaps make his surgery much longer. However, my suspicion is that this joint is stable. I demonstrated the findings to him, particularly of the nerve root compromise on the right side for the L4 nerve root. This  area definitely needs decompression and in order to do so it will necessitate an arthrodesis at L4-L5. We will schedule this at the earliest possible convenience.   Mr. Asare was concerned that he has had advanced knee degenerative changes and that he might need a knee replacement. He was told this by Dr. Noemi Chapel in the past. I am concerned that his L4 radiculopathy may be contributing substantially to his right sided knee pain, but the only I know of differentiating these two is to decompress the back. I believe that this takes some presidence and we will try to schedule this at the earliest convenience.   Sharine Cadle J 04/11/2013, 6:48 AM

## 2013-04-11 NOTE — Anesthesia Postprocedure Evaluation (Signed)
  Anesthesia Post-op Note  Patient: [name]  Procedure(s) Performed: Procedure(s) with comments: POSTERIOR LUMBAR FUSION 1 LEVEL (N/A) - L4-5 Posterior lumbar interbody fusion  Patient Location: PACU and NICU  Anesthesia Type:General  Level of Consciousness: Patient remains intubated per anesthesia plan  Airway and Oxygen Therapy: Patient remains intubated per anesthesia plan  Post-op Pain: mild  Post-op Assessment: Post-op Vital signs reviewed  Post-op Vital Signs: Reviewed  Complications: No apparent anesthesia complications

## 2013-04-12 LAB — GLUCOSE, CAPILLARY
Glucose-Capillary: 169 mg/dL — ABNORMAL HIGH (ref 70–99)
Glucose-Capillary: 177 mg/dL — ABNORMAL HIGH (ref 70–99)
Glucose-Capillary: 116 mg/dL — ABNORMAL HIGH (ref 70–99)
Glucose-Capillary: 131 mg/dL — ABNORMAL HIGH (ref 70–99)
Glucose-Capillary: 103 mg/dL — ABNORMAL HIGH (ref 70–99)

## 2013-04-12 LAB — CBC
HCT: 30.5 % — ABNORMAL LOW (ref 39.0–52.0)
Hemoglobin: 10.7 g/dL — ABNORMAL LOW (ref 13.0–17.0)
MCH: 32 pg (ref 26.0–34.0)
MCHC: 35.1 g/dL (ref 30.0–36.0)
MCV: 91.3 fL (ref 78.0–100.0)
Platelets: 190 10*3/uL (ref 150–400)
RBC: 3.34 MIL/uL — ABNORMAL LOW (ref 4.22–5.81)
RDW: 13.5 % (ref 11.5–15.5)
WBC: 6.9 10*3/uL (ref 4.0–10.5)

## 2013-04-12 LAB — BASIC METABOLIC PANEL
BUN: 31 mg/dL — ABNORMAL HIGH (ref 6–23)
CO2: 25 mEq/L (ref 19–32)
Calcium: 9 mg/dL (ref 8.4–10.5)
Chloride: 99 mEq/L (ref 96–112)
Creatinine, Ser: 1.6 mg/dL — ABNORMAL HIGH (ref 0.50–1.35)
GFR calc Af Amer: 48 mL/min — ABNORMAL LOW (ref >90)
GFR calc non Af Amer: 41 mL/min — ABNORMAL LOW (ref >90)
Glucose, Bld: 156 mg/dL — ABNORMAL HIGH (ref 70–99)
Potassium: 3.7 mEq/L (ref 3.7–5.3)
Sodium: 138 mEq/L (ref 137–147)

## 2013-04-12 NOTE — Progress Notes (Signed)
UR complete.  Samauri Kellenberger RN, MSN 

## 2013-04-12 NOTE — Progress Notes (Signed)
CSW received referral for questionable SNF.   Chart reviewed. PT recommended HHPT  Will advise RN Case Manager for assessment of home health and DME needs.   CSW will sign off. Please re-consult is CSW needs arise.    Arrion Broaddus LCSWA  [facility]  4N 1-16;  6N1-16 Phone: [phone]     

## 2013-04-12 NOTE — Progress Notes (Signed)
Subjective: Patient reports Offers no significant complaints. No back pain. Right lower extremity pain is much better.  Objective: Vital signs in last 24 hours: Temp:  [98.4 F (36.9 C)-99.2 F (37.3 C)] 99.1 F (37.3 C) (01/14 1724) Pulse Rate:  [80-95] 80 (01/14 1724) Resp:  [18] 18 (01/14 1724) BP: (119-170)/(64-84) 141/67 mmHg (01/14 1724) SpO2:  [95 %-98 %] 97 % (01/14 1724)  Intake/Output from previous day: 01/13 0701 - 01/14 0700 In: 3250 [I.V.:3000; IV Piggyback:250] Out: 1250 [Urine:1000; Blood:250] Intake/Output this shift:    Incision is clean and dry. Motor function in iliopsoas quadriceps tibialis anterior and gastrocs is good.  Lab Results:  Recent Labs  04/12/13 0523  WBC 6.9  HGB 10.7*  HCT 30.5*  PLT 190   BMET  Recent Labs  04/12/13 0523  NA 138  K 3.7  CL 99  CO2 25  GLUCOSE 156*  BUN 31*  CREATININE 1.60*  CALCIUM 9.0    Studies/Results: Dg Lumbar Spine 2-3 Views  04/11/2013   CLINICAL DATA:  L4-5 fusion.  EXAM: LUMBAR SPINE - 2-3 VIEW; DG C-ARM 1-60 MIN  COMPARISON:  MRI lumbar spine 03/03/2013.  FINDINGS: AP and lateral fluoroscopic intraoperative spot views of the lumbar spine are provided. Images demonstrate pedicle screws, stabilization bars and interbody spacer at L4-5. Anterolisthesis of L5 on S1 is noted as seen on prior studies.  IMPRESSION: L4-5 fusion.   Electronically Signed   By: Inge Rise M.D.   On: 04/11/2013 19:12   Dg Lumbar Spine 2-3 Views  04/11/2013   CLINICAL DATA:  L4-5 PLIF  EXAM: LUMBAR SPINE - 2-3 VIEW  COMPARISON:  03/03/2013  FINDINGS: Vertebral body height is well maintained. Surgical instruments are noted posterior to the L4-5 and L5-S1 interspace. Anterolisthesis of L5 with respect L4 and S1 is again noted. A second film was then obtained at 1520 hr which demonstrates surgical instruments deeper in the posterior elements at L4-5.   Electronically Signed   By: Inez Catalina M.D.   On: 04/11/2013 16:29   Dg  C-arm 1-60 Min  04/11/2013   CLINICAL DATA:  L4-5 fusion.  EXAM: LUMBAR SPINE - 2-3 VIEW; DG C-ARM 1-60 MIN  COMPARISON:  MRI lumbar spine 03/03/2013.  FINDINGS: AP and lateral fluoroscopic intraoperative spot views of the lumbar spine are provided. Images demonstrate pedicle screws, stabilization bars and interbody spacer at L4-5. Anterolisthesis of L5 on S1 is noted as seen on prior studies.  IMPRESSION: L4-5 fusion.   Electronically Signed   By: Inge Rise M.D.   On: 04/11/2013 19:12    Assessment/Plan: Stable postop day 1 to  LOS: 1 day  Continue mobilization. Low-level renal failure noted. Metformin has been stopped. Toradol should be stopped also.   Anterio Scheel J 04/12/2013, 7:52 PM

## 2013-04-12 NOTE — Evaluation (Signed)
Occupational Therapy Evaluation Patient Details Name: [name] MRN: [id number] DOB: 1940/04/11 Today's Date: 04/12/2013 Time: 2094-7096 OT Time Calculation (min): 25 min  OT Assessment / Plan / Recommendation History of present illness 73 y.o. s/p POSTERIOR LUMBAR FUSION 1 LEVEL (N/A) - L4-5 Posterior lumbar interbody fusion   Clinical Impression   Pt presents with below problem list. Pt independent with ADLs, PTA. Feel pt would benefit from acute OT to increase independence prior to d/c.     OT Assessment  Patient needs continued OT Services    Follow Up Recommendations  No OT follow up;Supervision/Assistance - 24 hour    Barriers to Discharge      Equipment Recommendations  Other (comment) (long handled sponge)    Recommendations for Other Services    Frequency  Min 2X/week    Precautions / Restrictions Precautions Precautions: Back;Fall Precaution Booklet Issued: Yes (comment) Precaution Comments: Reviewed precautions with pt. Required Braces or Orthoses: Spinal Brace Spinal Brace: Lumbar corset;Applied in sitting position   Pertinent Vitals/Pain Pain 2/10 in back. Increased activity during session. Repositioned at end.     ADL  Grooming: Set up;Supervision/safety Where Assessed - Grooming: Supported sitting Upper Body Dressing: Minimal assistance (back brace) Where Assessed - Upper Body Dressing: Unsupported sitting Lower Body Dressing: Maximal assistance (without AE) Where Assessed - Lower Body Dressing: Supported sit to stand Toilet Transfer: Magazine features editor Method: Sit to Loss adjuster, chartered: Raised toilet seat with arms (or 3-in-1 over toilet) Toileting - Clothing Manipulation and Hygiene: Min guard (just for clothing-no hygiene assessed) Where Assessed - Camera operator Manipulation and Hygiene: Standing Tub/Shower Transfer Method: Not assessed Equipment Used: Back brace;Gait belt;Rolling walker;Reacher;Long-handled sponge;Sock  aid Transfers/Ambulation Related to ADLs: Min guard ADL Comments: OT educated on shower transfer technique and AE for LB ADLs. Educated on use of toilet aid for hygiene. Educated on use of two cups for teeth care and having items on right side of sink to avoid breaking precautions.    OT Diagnosis: Acute pain  OT Problem List: Pain;Decreased knowledge of use of DME or AE;Decreased knowledge of precautions;Impaired balance (sitting and/or standing);Decreased activity tolerance;Decreased strength;Decreased range of motion OT Treatment Interventions: Self-care/ADL training;DME and/or AE instruction;Therapeutic activities;Patient/family education;Balance training   OT Goals(Current goals can be found in the care plan section) Acute Rehab OT Goals Patient Stated Goal: get back to the [facility] OT Goal Formulation: With patient Time For Goal Achievement: 04/19/13 Potential to Achieve Goals: Good ADL Goals Pt Will Perform Grooming: with modified independence;standing Pt Will Perform Lower Body Bathing: with modified independence;with adaptive equipment;sit to/from stand Pt Will Perform Lower Body Dressing: with modified independence;with adaptive equipment;sit to/from stand Pt Will Transfer to Toilet: with modified independence;ambulating (3 in 1 over commode) Pt Will Perform Toileting - Clothing Manipulation and hygiene: with modified independence;sit to/from stand Pt Will Perform Tub/Shower Transfer: Shower transfer;with supervision;ambulating;shower seat;rolling walker Additional ADL Goal #1: Pt will perform bed mobility at Mod I level as precursor for ADLs.  Additional ADL Goal #2: Pt will independently verbalize and demonstrate 3/3 back precautions.    Visit Information  Last OT Received On: 04/12/13 Assistance Needed: +1 History of Present Illness: 73 y.o. s/p POSTERIOR LUMBAR FUSION 1 LEVEL (N/A) - L4-5 Posterior lumbar interbody fusion       Prior [place] expects to be discharged to:: Private residence Living Arrangements: Spouse/significant other Available Help at Discharge: Family;Available 24 hours/day Type of Home: House Home Access: Stairs  to enter Entrance Stairs-Number of Steps: 5 Entrance Stairs-Rails: Right;Left;Can reach both Home Layout: One level Home Equipment: Toilet riser;Shower seat;Adaptive equipment;Walker - standard;Cane - single point Adaptive Equipment: Reacher;Sock aid;Long-handled shoe horn Prior Function Level of Independence: Independent Communication Communication: No difficulties Dominant Hand: Right         Vision/Perception     Cognition  Cognition Arousal/Alertness: Awake/alert Behavior During Therapy: WFL for tasks assessed/performed Overall Cognitive Status: Within Functional Limits for tasks assessed    Extremity/Trunk Assessment Upper Extremity Assessment Upper Extremity Assessment: Overall WFL for tasks assessed Lower Extremity Assessment Lower Extremity Assessment: Defer to PT evaluation     Mobility Bed Mobility Overal bed mobility: Needs Assistance Bed Mobility: Rolling;Sidelying to Sit Rolling: Min assist Sidelying to sit: Min assist General bed mobility comments: Assist with rolling and also to help elevate trunk. Cues for technique. Transfers Overall transfer level: Needs assistance Equipment used: Rolling walker (2 wheeled) Transfers: Sit to/from Stand Sit to Stand: Min guard General transfer comment: Min guard for safety. Cues for technique.     Exercise     Balance     End of Session OT - End of Session Equipment Utilized During Treatment: Gait belt;Rolling walker;Back brace Activity Tolerance: Patient tolerated treatment well Patient left: in chair;with call bell/phone within reach Nurse Communication: Other (comment) (pt sitting in chair)  GO     Benito Mccreedy OTR/L [phone] 04/12/2013, 11:04 AM

## 2013-04-12 NOTE — Evaluation (Signed)
Physical Therapy Evaluation Patient Details Name: [name] MRN: [id number] DOB: 09-19-1940 Today's Date: 04/12/2013 Time: 3154-0086 PT Time Calculation (min): 25 min  PT Assessment / Plan / Recommendation History of Present Illness  73 y.o. s/p POSTERIOR LUMBAR FUSION 1 LEVEL (N/A) - L4-5 Posterior lumbar interbody fusion  Clinical Impression  Pt moving well and anticipate great progress.  Will continue to follow.      PT Assessment  Patient needs continued PT services    Follow Up Recommendations  Home health PT;Supervision/Assistance - 24 hour    Does the patient have the potential to tolerate intense rehabilitation      Barriers to Discharge        Equipment Recommendations  Rolling walker with 5" wheels    Recommendations for Other Services     Frequency Min 5X/week    Precautions / Restrictions Precautions Precautions: Back;Fall Precaution Booklet Issued: Yes (comment) Precaution Comments: Reviewed precautions with pt. Required Braces or Orthoses: Spinal Brace Spinal Brace: Lumbar corset;Applied in sitting position Restrictions Weight Bearing Restrictions: No   Pertinent Vitals/Pain 2/10 around incision.  Premedicated.        Mobility  Bed Mobility Overal bed mobility: Needs Assistance Bed Mobility: Rolling;Sidelying to Sit;Sit to Sidelying Rolling: Min guard Sidelying to sit: Min assist Sit to sidelying: Supervision General bed mobility comments: cues for log roll technique and back precautions.   Transfers Overall transfer level: Needs assistance Equipment used: Rolling walker (2 wheeled) Transfers: Sit to/from Stand Sit to Stand: Min guard General transfer comment: cues for UE use.   Ambulation/Gait Ambulation/Gait assistance: Min guard Ambulation Distance (Feet): 500 Feet Assistive device: Rolling walker (2 wheeled) Gait Pattern/deviations: Step-through pattern;Decreased stride length General Gait Details: pt moves slowly and needs cues  for back precautions during ambulation as pt tends to twist while trying to converse.      Exercises     PT Diagnosis: Difficulty walking  PT Problem List: Decreased strength;Decreased activity tolerance;Decreased balance;Decreased mobility;Decreased knowledge of use of DME;Decreased knowledge of precautions;Pain PT Treatment Interventions: DME instruction;Gait training;Stair training;Functional mobility training;Therapeutic activities;Therapeutic exercise;Balance training;Neuromuscular re-education;Patient/family education     PT Goals(Current goals can be found in the care plan section) Acute Rehab PT Goals Patient Stated Goal: get back to the [facility] PT Goal Formulation: With patient Time For Goal Achievement: 04/19/13 Potential to Achieve Goals: Good  Visit Information  Last PT Received On: 04/12/13 Assistance Needed: +1 History of Present Illness: 73 y.o. s/p POSTERIOR LUMBAR FUSION 1 LEVEL (N/A) - L4-5 Posterior lumbar interbody fusion       Prior [place] expects to be discharged to:: Private residence Living Arrangements: Spouse/significant other Available Help at Discharge: Family;Available 24 hours/day Type of Home: House Home Access: Stairs to enter [employer] of Steps: 5 Entrance Stairs-Rails: Right;Left;Can reach both Home Layout: One level Home Equipment: Toilet riser;Shower seat;Adaptive equipment;Walker - standard;Cane - single point Adaptive Equipment: Reacher;Sock aid;Long-handled shoe horn Prior Function Level of Independence: Independent Communication Communication: No difficulties Dominant Hand: Right    Cognition  Cognition Arousal/Alertness: Awake/alert Behavior During Therapy: WFL for tasks assessed/performed Overall Cognitive Status: Within Functional Limits for tasks assessed    Extremity/Trunk Assessment Upper Extremity Assessment Upper Extremity Assessment: Defer to OT evaluation Lower Extremity  Assessment Lower Extremity Assessment: Overall WFL for tasks assessed   Balance    End of Session PT - End of Session Equipment Utilized During Treatment: Gait belt;Back brace Activity Tolerance: Patient tolerated treatment well Patient left: in bed;with call  bell/phone within reach;with family/visitor present Nurse Communication: Mobility status  GP     Catarina Hartshorn, [place] 04/12/2013, 2:00 PM

## 2013-04-12 NOTE — Progress Notes (Signed)
Orthopedic Tech Progress Note Patient Details:  [name] 06/25/1940 [id number]  Patient ID: [name], male   DOB: 1940-06-26, 73 y.o.   MRN: [id number] Brace order completed by Warnell Forester, Trachelle Low 04/12/2013, 10:01 AM

## 2013-04-13 LAB — GLUCOSE, CAPILLARY
Glucose-Capillary: 112 mg/dL — ABNORMAL HIGH (ref 70–99)
Glucose-Capillary: 158 mg/dL — ABNORMAL HIGH (ref 70–99)

## 2013-04-13 MED ORDER — DIAZEPAM 5 MG PO TABS: 5.0000 mg | ORAL_TABLET | Freq: Four times a day (QID) | ORAL | Status: DC | PRN

## 2013-04-13 MED ORDER — OXYCODONE-ACETAMINOPHEN 5-325 MG PO TABS: 1.0000 | ORAL_TABLET | ORAL | Status: DC | PRN

## 2013-04-13 MED FILL — Sodium Chloride IV Soln 0.9%: INTRAVENOUS | Qty: 1000 | Status: AC

## 2013-04-13 MED FILL — Heparin Sodium (Porcine) Inj 1000 Unit/ML: INTRAMUSCULAR | Qty: 30 | Status: AC

## 2013-04-13 NOTE — Discharge Summary (Signed)
Physician Discharge Summary  Patient ID: [name] MRN: [id number] DOB/AGE: [birth year] 73 y.o.  Admit date: 04/11/2013 Discharge date: 04/13/2013  Admission Diagnoses:  Discharge Diagnoses:  Active Problems:   Lumbar stenosis   Discharged Condition: good  Hospital Course: Patient admitted to the hospital where he underwent uncomplicated C3-7 decompression and fusion. Postoperatively he is doing well. Back and leg pain are much improved. Strength and sensation are intact. Patient has mobilize with physical therapy. His pain is well controlled with medicines by mouth. He is ambulating without difficulty and ready for discharge home.  Consults:   Significant Diagnostic Studies:   Treatments:   Discharge Exam: Blood pressure 122/67, pulse 79, temperature 98.2 F (36.8 C), temperature source Oral, resp. rate 18, height 6' (1.829 m), weight 118.61 kg (261 lb 7.8 oz), SpO2 95.00%. Awake and alert. Oriented and appropriate. Cranial nerve function intact. Motor and sensory function of the extremities normal. Wound clean and dry. Chest and abdomen benign.  Disposition: 01-Home or Self Care     Medication List         allopurinol 300 MG tablet  Commonly known as:  ZYLOPRIM  Take 150 mg by mouth every morning.     aspirin EC 81 MG tablet  Take 81 mg by mouth daily.     atorvastatin 10 MG tablet  Commonly known as:  LIPITOR  Take 10 mg by mouth at bedtime.     diazepam 5 MG tablet  Commonly known as:  VALIUM  Take 1-2 tablets (5-10 mg total) by mouth every 6 (six) hours as needed for muscle spasms.     fluticasone 50 MCG/ACT nasal spray  Commonly known as:  FLONASE  Place into both nostrils daily.     Garlic 628 MG Tabs  Take 500 mg by mouth daily.     gemfibrozil 600 MG tablet  Commonly known as:  LOPID  Take 600 mg by mouth 2 (two) times daily.     glipiZIDE 5 MG tablet  Commonly known as:  GLUCOTROL  Take 10 mg by mouth 2 (two) times daily before a meal.      HYDROcodone-acetaminophen 7.5-325 MG per tablet  Commonly known as:  NORCO  Take 1-2 tablets by mouth every 6 (six) hours as needed for moderate pain.     insulin aspart 100 UNIT/ML injection  Commonly known as:  novoLOG  Inject 10 Units into the skin 3 (three) times daily before meals.     insulin glargine 100 UNIT/ML injection  Commonly known as:  LANTUS  Inject 50 Units into the skin at bedtime.     meloxicam 15 MG tablet  Commonly known as:  MOBIC  Take 15 mg by mouth daily.     metformin 1000 MG (OSM) 24 hr tablet  Commonly known as:  FORTAMET  Take 1,000 mg by mouth daily with breakfast.     oxyCODONE-acetaminophen 5-325 MG per tablet  Commonly known as:  PERCOCET/ROXICET  Take 1-2 tablets by mouth every 4 (four) hours as needed for moderate pain.     rOPINIRole 2 MG tablet  Commonly known as:  REQUIP  Take 2 mg by mouth at bedtime.     Saw Palmetto 450 MG Caps  Take 450 mg by mouth daily.     tamsulosin 0.4 MG Caps capsule  Commonly known as:  FLOMAX  Take 0.4 mg by mouth daily.     traMADol 50 MG tablet  Commonly known as:  ULTRAM  Take 50 mg  by mouth 2 (two) times daily as needed for moderate pain.     TRIBENZOR 40-10-25 MG Tabs  Generic drug:  Olmesartan-Amlodipine-HCTZ  Take 1 tablet by mouth daily.     VITRUM 50+ SENIOR MULTI PO  Take 1 tablet by mouth daily.     ZINC-MAGNESIUM ASPART-VIT B6 PO  Take by mouth.           Follow-up Information   Follow up with Earleen Newport, MD.   Specialty:  Neurosurgery   Contact information:   [address] [place] [address] [phone]       Signed: Charlie Pitter 04/13/2013, 10:08 AM

## 2013-04-13 NOTE — Progress Notes (Signed)
Pt ambulated around unit one time with walker and nurse. Pt tolerated very well. Will continue to monitor.

## 2013-04-13 NOTE — Care Management Note (Signed)
    Page 1 of 1   04/13/2013     11:50:54 AM   CARE MANAGEMENT NOTE 04/13/2013  Patient:  DAERON, CARRENO   Account Number:  [id number]  Date Initiated:  04/13/2013  Documentation initiated by:  Lorne Skeens  Subjective/Objective Assessment:   patient admitted for L4-5 lumbar decompression.     Action/Plan:   Will follow for discharge needs   Anticipated DC Date:  04/13/2013   Anticipated DC Plan:  [place]  CM consult      Choice offered to / List presented to:             Status of service:  Completed, signed off Medicare Important Message given?   (If response is "NO", the following Medicare IM given date fields will be blank) Date Medicare IM given:   Date Additional Medicare IM given:    Discharge Disposition:  HOME/SELF CARE  Per UR Regulation:  Reviewed for med. necessity/level of care/duration of stay  If discussed at [place] of Stay Meetings, dates discussed:    Comments:  04/13/13 Mackey, MSN, CM- Spoke with patient regarding home health to determine if Dr Annette Stable had discussed [place] at discharge, as no orders were placed. Dr Marchelle Folks note indicates that patient is "ambulating without difficulty".  Patient states that no mention was made of [place].  He feels that he is walking well and would not benefit from HHPT.  He states he has a rolling walker at home already.  Patient's RN was updated.

## 2013-04-13 NOTE — Discharge Instructions (Signed)

## 2013-04-13 NOTE — Progress Notes (Signed)
Pt. [name] home via car with wife.  DC instructions and prescriptions given.  Assessments and vital signs were stable.

## 2013-04-13 NOTE — Progress Notes (Signed)
Physical Therapy Treatment Patient Details Name: [name] MRN: [id number] DOB: [birth year] Today's Date: 04/13/2013 Time: 4481-8563 PT Time Calculation (min): 23 min  PT Assessment / Plan / Recommendation  History of Present Illness 74 y.o. s/p POSTERIOR LUMBAR FUSION 1 LEVEL (N/A) - L4-5 Posterior lumbar interbody fusion   PT Comments   Patient progressing well with overall mobility. Able to tolerate stair training well. Anticipate DC home tomorrow.   Follow Up Recommendations  Home health PT;Supervision/Assistance - 24 hour     Does the patient have the potential to tolerate intense rehabilitation     Barriers to Discharge        Equipment Recommendations  Rolling walker with 5" wheels    Recommendations for Other Services    Frequency Min 5X/week   Progress towards PT Goals Progress towards PT goals: Progressing toward goals  Plan Current plan remains appropriate    Precautions / Restrictions Precautions Precautions: Back;Fall Precaution Comments: Patient able to recall all precautions and maintain with mobility Required Braces or Orthoses: Spinal Brace Spinal Brace: Lumbar corset;Applied in sitting position   Pertinent Vitals/Pain 4/10 back pain. RN aware    Mobility  Bed Mobility Overal bed mobility: Modified Independent Transfers Overall transfer level: Modified independent Ambulation/Gait Ambulation/Gait assistance: Supervision Ambulation Distance (Feet): 600 Feet Assistive device: Rolling walker (2 wheeled) Gait Pattern/deviations: Step-through pattern General Gait Details: Cues for upright posture Stairs: Yes Stairs assistance: Min guard Stair Management: Step to pattern;Forwards;Two rails Number of Stairs: 10    Exercises     PT Diagnosis:    PT Problem List:   PT Treatment Interventions:     PT Goals (current goals can now be found in the care plan section)    Visit Information  Last PT Received On: 04/13/13 Assistance Needed:  +1 History of Present Illness: 73 y.o. s/p POSTERIOR LUMBAR FUSION 1 LEVEL (N/A) - L4-5 Posterior lumbar interbody fusion    Subjective Data      Cognition  Cognition Arousal/Alertness: Awake/alert Behavior During Therapy: WFL for tasks assessed/performed Overall Cognitive Status: Within Functional Limits for tasks assessed    Balance     End of Session PT - End of Session Equipment Utilized During Treatment: Gait belt;Back brace Activity Tolerance: Patient tolerated treatment well Patient left: in bed;with call bell/phone within reach;with family/visitor present Nurse Communication: Mobility status;Patient requests pain meds   GP     Jacqualyn Posey 04/13/2013, 9:16 AM  04/13/2013 Jacqualyn Posey PTA [phone] pager [phone] office

## 2013-08-10 ENCOUNTER — Other Ambulatory Visit: Payer: Self-pay | Admitting: Orthopedic Surgery

## 2013-08-10 DIAGNOSIS — M25551 Pain in right hip: Secondary | ICD-10-CM

## 2013-08-17 ENCOUNTER — Ambulatory Visit
Admission: RE | Admit: 2013-08-17 | Discharge: 2013-08-17 | Disposition: A | Discharge status: Home / Self Care | Payer: Commercial Managed Care - HMO | Source: Ambulatory Visit | Attending: Orthopedic Surgery | Admitting: Orthopedic Surgery

## 2013-08-17 DIAGNOSIS — M25551 Pain in right hip: Secondary | ICD-10-CM

## 2013-09-26 NOTE — ED Notes (Signed)
Accessed record for physician phone call-[facility]

## 2013-12-27 ENCOUNTER — Encounter (HOSPITAL_COMMUNITY): Payer: Self-pay | Admitting: Physician Assistant

## 2013-12-27 DIAGNOSIS — K219 Gastro-esophageal reflux disease without esophagitis: Secondary | ICD-10-CM | POA: Diagnosis present

## 2013-12-27 DIAGNOSIS — R351 Nocturia: Secondary | ICD-10-CM | POA: Diagnosis present

## 2013-12-27 DIAGNOSIS — M5416 Radiculopathy, lumbar region: Secondary | ICD-10-CM

## 2013-12-27 DIAGNOSIS — M48061 Spinal stenosis, lumbar region without neurogenic claudication: Secondary | ICD-10-CM | POA: Diagnosis present

## 2013-12-27 DIAGNOSIS — Z9889 Other specified postprocedural states: Secondary | ICD-10-CM | POA: Diagnosis present

## 2013-12-27 DIAGNOSIS — M1611 Unilateral primary osteoarthritis, right hip: Secondary | ICD-10-CM | POA: Diagnosis present

## 2013-12-27 DIAGNOSIS — E1169 Type 2 diabetes mellitus with other specified complication: Secondary | ICD-10-CM

## 2013-12-27 DIAGNOSIS — E669 Obesity, unspecified: Secondary | ICD-10-CM

## 2013-12-27 DIAGNOSIS — R112 Nausea with vomiting, unspecified: Secondary | ICD-10-CM | POA: Diagnosis present

## 2013-12-27 DIAGNOSIS — M1711 Unilateral primary osteoarthritis, right knee: Secondary | ICD-10-CM | POA: Diagnosis present

## 2013-12-27 NOTE — H&P (Signed)
TOTAL KNEE ADMISSION H&P  Patient is being admitted for right total knee arthroplasty.  Subjective:  Chief Complaint:right knee pain.  HPI: [name], 73 y.o. male, has a history of pain and functional disability in the right knee due to arthritis and has failed non-surgical conservative treatments for greater than 12 weeks to includeNSAID's and/or analgesics, corticosteriod injections, viscosupplementation injections, flexibility and strengthening excercises, supervised PT with diminished ADL's post treatment, use of assistive devices, weight reduction as appropriate and activity modification.  Onset of symptoms was gradual, starting 10 years ago with gradually worsening course since that time. The patient noted prior procedures on the knee to include  arthroscopy and menisectomy on the right knee(s).  Patient currently rates pain in the right knee(s) at 10 out of 10 with activity. Patient has night pain, worsening of pain with activity and weight bearing, pain that interferes with activities of daily living, pain with passive range of motion, crepitus and joint swelling.  Patient has evidence of subchondral sclerosis, periarticular osteophytes and joint space narrowing by imaging studies. There is no active infection.  Patient Active Problem List   Diagnosis Date Noted  . Primary osteoarthritis of right knee   . Primary localized osteoarthritis of right hip   . Spinal stenosis of lumbar region with radiculopathy   . Diabetes mellitus type 2 in obese   . PONV (postoperative nausea and vomiting)   . GERD (gastroesophageal reflux disease)   . Frequent urination at night   . Lumbar stenosis 04/11/2013  . Kidney stones   . Arthritis   . Neuromuscular disorder   . Right knee DJD   . Chronic calculus cholecystitis 12/31/2011   Past Medical History  Diagnosis Date  . Kidney stones   . Hypertension   . Diabetes mellitus type 2 in obese   . PONV (postoperative nausea and vomiting)   .  GERD (gastroesophageal reflux disease)     occ indigestion  . Arthritis     arthirtis all over  . Neuromuscular disorder     restless leg syndrome  . Right knee DJD   . Hyperlipemia   . Frequent urination at night   . Primary osteoarthritis of right knee   . Primary localized osteoarthritis of right hip   . Spinal stenosis of lumbar region with radiculopathy   . PONV (postoperative nausea and vomiting)     Past Surgical History  Procedure Laterality Date  . Lithotripsy    . Shoulder surgery      right  . Elbow surgery      right  . Abdominal hernia repair    . Total knee arthroplasty Left 07/2006    left knee  . Hernia repair  1980    inguinal  . Shoulder arthroscopy  1980    right  . Elbow arthroscopy  1990    right  . Stone extraction with basket  2010  . Joint replacement  2008    knee    No prescriptions prior to admission   No Known Allergies  History  Substance Use Topics  . Smoking status: Never Smoker   . Smokeless tobacco: Not on file  . Alcohol Use: No    Family History  Problem Relation Age of Onset  . Hypertension Mother   . Hypertension Father   . Hypertension Brother   . Kidney disease Mother   . Diabetes Mellitus II Father   . Diabetes Mellitus II Brother   . Diabetes Mellitus II Brother   .  Cancer - Prostate Brother      Review of Systems  Constitutional: Negative.   HENT: Positive for congestion. Negative for ear discharge, ear pain, hearing loss, nosebleeds, sore throat and tinnitus.   Eyes: Negative.   Respiratory: Negative.  Negative for stridor.   Gastrointestinal: Negative.   Genitourinary: Negative.   Musculoskeletal: Positive for back pain and joint pain. Negative for falls, myalgias and neck pain.       Right hip and knee pain  Skin: Negative.   Neurological: Negative.  Negative for headaches.  Endo/Heme/Allergies: Negative.   Psychiatric/Behavioral: Negative.     Objective:  Physical Exam  Constitutional: He is oriented  to person, place, and time. He appears well-developed and well-nourished.  HENT:  Head: Normocephalic and atraumatic.  Mouth/Throat: Oropharynx is clear and moist.  Eyes: Conjunctivae and EOM are normal. Pupils are equal, round, and reactive to light.  Neck: Neck supple.  Cardiovascular: Normal rate and regular rhythm.   Respiratory: Effort normal and breath sounds normal.  GI: Soft. Bowel sounds are normal.  Genitourinary:  Not pertinent to current symptomatology therefore not examined.  Musculoskeletal:  Examination of his right knee reveals pain medially and laterally 1+ crepitation 1+ synovitis range of motion is from 0-120 degrees knee is stable with diffuse pain and normal patella tracking. Exam of the left knee reveals well healed total knee incision without swelling or pain, full range of motion knee is stable with normal patella tracking. Exam of the right hip reveals decreased range of motion by 50% with mild pain. Left hip has full range of motion without pain. Vascular exam: pulses 2+ and symmetric.  Neurological: He is alert and oriented to person, place, and time.  Skin: Skin is warm and dry.  Psychiatric: He has a normal mood and affect. His behavior is normal.    Vital signs in last 24 hours: Temp:  [98.1 F (36.7 C)] 98.1 F (36.7 C) (09/30 1300) Pulse Rate:  [88] 88 (09/30 1300) BP: (146)/(85) 146/85 mmHg (09/30 1300) SpO2:  [95 %] 95 % (09/30 1300) Weight:  [121.564 kg (268 lb)] 121.564 kg (268 lb) (09/30 1300)  Labs:   Estimated body mass index is 38.45 kg/(m^2) as calculated from the following:   Height as of this encounter: 5\' 10"  (1.778 m).   Weight as of this encounter: 121.564 kg (268 lb).   Imaging Review Plain radiographs demonstrate severe degenerative joint disease of the right knee(s). The overall alignment issignificant varus. The bone quality appears to be good for age and reported activity level.  Assessment/Plan:  End stage arthritis, right  knee   The patient history, physical examination, clinical judgment of the provider and imaging studies are consistent with end stage degenerative joint disease of the right knee(s) and total knee arthroplasty is deemed medically necessary. The treatment options including medical management, injection therapy arthroscopy and arthroplasty were discussed at length. The risks and benefits of total knee arthroplasty were presented and reviewed. The risks due to aseptic loosening, infection, stiffness, patella tracking problems, thromboembolic complications and other imponderables were discussed. The patient acknowledged the explanation, agreed to proceed with the plan and consent was signed. Patient is being admitted for inpatient treatment for surgery, pain control, PT, OT, prophylactic antibiotics, VTE prophylaxis, progressive ambulation and ADL's and discharge planning. The patient is planning to be discharged to skilled nursing facility [facility]. Kaleen Mask Physician Assistant [facility] [phone]  12/27/2013, 2:24 PM

## 2013-12-28 NOTE — Pre-Procedure Instructions (Signed)
DOMINYK LAW  12/28/2013   Your procedure is scheduled on:  Monday, October 12th  Report to [facility] Admitting at 530 AM.  Call this number if you have problems the morning of surgery: [phone]   Remember:   Do not eat food or drink liquids after midnight.   Take these medicines the morning of surgery with A SIP OF WATER: flonase, valium if needed, pain medication if needed, tribenzor, flomax   Do not wear jewelry.  Do not wear lotions, powders, or perfumes. You may wear deodorant.  Do not shave 48 hours prior to surgery. Men may shave face and neck.  Do not bring valuables to the hospital.  [facility] is not responsible for any belongings or valuables.               Contacts, dentures or bridgework may not be worn into surgery.  Leave suitcase in the car. After surgery it may be brought to your room.  For patients admitted to the hospital, discharge time is determined by your treatment team.             Please read over the following fact sheets that you were given: Pain Booklet, Coughing and Deep Breathing, Blood Transfusion Information, MRSA Information and Surgical Site Infection Prevention [place] - Preparing for Surgery  Before surgery, you can play an important role.  Because skin is not sterile, your skin needs to be as free of germs as possible.  You can reduce the number of germs on you skin by washing with CHG (chlorahexidine gluconate) soap before surgery.  CHG is an antiseptic cleaner which kills germs and bonds with the skin to continue killing germs even after washing.  Please DO NOT use if you have an allergy to CHG or antibacterial soaps.  If your skin becomes reddened/irritated stop using the CHG and inform your nurse when you arrive at Short Stay.  Do not shave (including legs and underarms) for at least 48 hours prior to the first CHG shower.  You may shave your face.  Please follow these instructions carefully:   1.  Shower with CHG Soap  the night before surgery and the morning of Surgery.  2.  If you choose to wash your hair, wash your hair first as usual with your normal shampoo.  3.  After you shampoo, rinse your hair and body thoroughly to remove the shampoo.  4.  Use CHG as you would any other liquid soap.  You can apply CHG directly to the skin and wash gently with scrungie or a clean washcloth.  5.  Apply the CHG Soap to your body ONLY FROM THE NECK DOWN.  Do not use on open wounds or open sores.  Avoid contact with your eyes, ears, mouth and genitals (private parts).  Wash genitals (private parts) with your normal soap.  6.  Wash thoroughly, paying special attention to the area where your surgery will be performed.  7.  Thoroughly rinse your body with warm water from the neck down.  8.  DO NOT shower/wash with your normal soap after using and rinsing off the CHG Soap.  9.  Pat yourself dry with a clean towel.            10.  Wear clean pajamas.            11.  Place clean sheets on your bed the night of your first shower and do not sleep with pets.  Day  of Surgery  Do not apply any lotions/deoderants the morning of surgery.  Please wear clean clothes to the hospital/surgery center.

## 2013-12-29 ENCOUNTER — Encounter (HOSPITAL_COMMUNITY): Payer: Self-pay

## 2013-12-29 ENCOUNTER — Encounter (HOSPITAL_COMMUNITY)
Admission: RE | Admit: 2013-12-29 | Discharge: 2013-12-29 | Disposition: A | Discharge status: Home / Self Care | Payer: Medicare HMO | Source: Ambulatory Visit | Attending: Orthopedic Surgery | Admitting: Orthopedic Surgery

## 2013-12-29 DIAGNOSIS — Z01812 Encounter for preprocedural laboratory examination: Secondary | ICD-10-CM | POA: Insufficient documentation

## 2013-12-29 DIAGNOSIS — M1711 Unilateral primary osteoarthritis, right knee: Secondary | ICD-10-CM | POA: Insufficient documentation

## 2013-12-29 HISTORY — DX: Unspecified glaucoma: H40.9

## 2013-12-29 LAB — URINALYSIS, ROUTINE W REFLEX MICROSCOPIC
Bilirubin Urine: NEGATIVE
Glucose, UA: >1000 mg/dL — AB
Hgb urine dipstick: NEGATIVE
Ketones, ur: NEGATIVE mg/dL
Leukocytes, UA: NEGATIVE
Nitrite: NEGATIVE
Protein, ur: NEGATIVE mg/dL
Specific Gravity, Urine: 1.026 (ref 1.005–1.030)
Urobilinogen, UA: 0.2 mg/dL (ref 0.0–1.0)
pH: 5 (ref 5.0–8.0)

## 2013-12-29 LAB — COMPREHENSIVE METABOLIC PANEL
ALT: 32 U/L (ref 0–53)
AST: 23 U/L (ref 0–37)
Albumin: 3.9 g/dL (ref 3.5–5.2)
Alkaline Phosphatase: 41 U/L (ref 39–117)
Anion gap: 18 — ABNORMAL HIGH (ref 5–15)
BUN: 37 mg/dL — ABNORMAL HIGH (ref 6–23)
CO2: 21 mEq/L (ref 19–32)
Calcium: 11 mg/dL — ABNORMAL HIGH (ref 8.4–10.5)
Chloride: 98 mEq/L (ref 96–112)
Creatinine, Ser: 1.51 mg/dL — ABNORMAL HIGH (ref 0.50–1.35)
GFR calc Af Amer: 51 mL/min — ABNORMAL LOW (ref >90)
GFR calc non Af Amer: 44 mL/min — ABNORMAL LOW (ref >90)
Glucose, Bld: 172 mg/dL — ABNORMAL HIGH (ref 70–99)
Potassium: 4 mEq/L (ref 3.7–5.3)
Sodium: 137 mEq/L (ref 137–147)
Total Bilirubin: 0.5 mg/dL (ref 0.3–1.2)
Total Protein: 8.1 g/dL (ref 6.0–8.3)

## 2013-12-29 LAB — CBC WITH DIFFERENTIAL/PLATELET
Basophils Absolute: 0 10*3/uL (ref 0.0–0.1)
Basophils Relative: 0 % (ref 0–1)
Eosinophils Absolute: 0.2 10*3/uL (ref 0.0–0.7)
Eosinophils Relative: 3 % (ref 0–5)
HCT: 45.2 % (ref 39.0–52.0)
Hemoglobin: 16.3 g/dL (ref 13.0–17.0)
Lymphocytes Relative: 24 % (ref 12–46)
Lymphs Abs: 1.6 10*3/uL (ref 0.7–4.0)
MCH: 32.5 pg (ref 26.0–34.0)
MCHC: 36.1 g/dL — ABNORMAL HIGH (ref 30.0–36.0)
MCV: 90 fL (ref 78.0–100.0)
Monocytes Absolute: 0.7 10*3/uL (ref 0.1–1.0)
Monocytes Relative: 10 % (ref 3–12)
Neutro Abs: 4.1 10*3/uL (ref 1.7–7.7)
Neutrophils Relative %: 63 % (ref 43–77)
Platelets: 234 10*3/uL (ref 150–400)
RBC: 5.02 MIL/uL (ref 4.22–5.81)
RDW: 13.6 % (ref 11.5–15.5)
WBC: 6.7 10*3/uL (ref 4.0–10.5)

## 2013-12-29 LAB — TYPE AND SCREEN
ABO/RH(D): A POS
Antibody Screen: NEGATIVE

## 2013-12-29 LAB — SURGICAL PCR SCREEN
MRSA, PCR: NEGATIVE
Staphylococcus aureus: NEGATIVE

## 2013-12-29 LAB — URINE MICROSCOPIC-ADD ON

## 2013-12-29 LAB — HEMOGLOBIN A1C
Hgb A1c MFr Bld: 8.4 % — ABNORMAL HIGH (ref <5.7)
Mean Plasma Glucose: 194 mg/dL — ABNORMAL HIGH (ref <117)

## 2013-12-29 LAB — PROTIME-INR
INR: 1.05 (ref 0.00–1.49)
Prothrombin Time: 13.7 seconds (ref 11.6–15.2)

## 2013-12-29 LAB — APTT: aPTT: 42 seconds — ABNORMAL HIGH (ref 24–37)

## 2013-12-29 NOTE — Progress Notes (Addendum)
Patient denies any cardiac issues, no cardiac tests done. Had sleep study done, that test was negative, but supposedly he has RLS.   DA States his urine will test high for sugars.  He explained that since he takes the Invokana, its been running high.  PCP is aware.   DA

## 2013-12-30 LAB — URINE CULTURE
Colony Count: NO GROWTH
Culture: NO GROWTH

## 2014-01-01 NOTE — Progress Notes (Signed)
Anesthesia Chart Review:  Pt is 73 year old male scheduled for R total knee replacement on 01/08/14 with Dr. Noemi Chapel.   PMH: HTN, DM, hyperlipidemia, GERD, restless leg syndrome  Medications include: ASA, amlodipine, losartan, metformin, invokana, insulin, lipitor, gemfibrozil, requip, flomax  Preoperative labs reviewed.  BUN/Cr 37/1.51; this is stable. Values over last 2 years range BUN 31-81, Cr 1.6-2.71.    APTT is 42.    Chest x-ray 03/29/2013 reviewed. No active cardiopulmonary disease.   EKG 03/29/2013:  NSR. Nonspecific T wave abnormality.   Will recheck APTT day of surgery.   I anticipate pt can proceed with surgery as scheduled.   Willeen Cass, FNP-BC [facility]/Anesthesiology Phone: [phone] 01/01/2014 1:50 PM

## 2014-01-07 MED ORDER — POVIDONE-IODINE 7.5 % EX SOLN
Freq: Once | CUTANEOUS | Status: DC
  Filled 2014-01-07: qty 118

## 2014-01-07 MED ORDER — CHLORHEXIDINE GLUCONATE 4 % EX LIQD
60.0000 mL | Freq: Once | CUTANEOUS | Status: DC
  Filled 2014-01-07: qty 60

## 2014-01-07 MED ORDER — LACTATED RINGERS IV SOLN: INTRAVENOUS | Status: DC

## 2014-01-07 MED ORDER — CEFAZOLIN SODIUM 10 G IJ SOLR
3.0000 g | INTRAMUSCULAR | Status: AC
  Administered 2014-01-08: 3 g via INTRAVENOUS
  Filled 2014-01-07: qty 3000

## 2014-01-08 ENCOUNTER — Encounter (HOSPITAL_COMMUNITY): Payer: Self-pay | Admitting: *Deleted

## 2014-01-08 ENCOUNTER — Encounter (HOSPITAL_COMMUNITY): Payer: Medicare HMO | Admitting: Emergency Medicine

## 2014-01-08 ENCOUNTER — Inpatient Hospital Stay (HOSPITAL_COMMUNITY)
Admission: RE | Admit: 2014-01-08 | Discharge: 2014-01-11 | DRG: 470 | Disposition: A | Discharge status: Home Health | Payer: Medicare HMO | Source: Ambulatory Visit | Attending: Orthopedic Surgery | Admitting: Orthopedic Surgery

## 2014-01-08 ENCOUNTER — Encounter (HOSPITAL_COMMUNITY)
Admission: RE | Disposition: A | Discharge status: Home Health | Payer: Self-pay | Source: Ambulatory Visit | Attending: Orthopedic Surgery

## 2014-01-08 ENCOUNTER — Inpatient Hospital Stay (HOSPITAL_COMMUNITY): Payer: Medicare HMO | Admitting: Anesthesiology

## 2014-01-08 DIAGNOSIS — Z794 Long term (current) use of insulin: Secondary | ICD-10-CM | POA: Diagnosis not present

## 2014-01-08 DIAGNOSIS — M1711 Unilateral primary osteoarthritis, right knee: Principal | ICD-10-CM | POA: Diagnosis present

## 2014-01-08 DIAGNOSIS — Z6835 Body mass index (BMI) 35.0-35.9, adult: Secondary | ICD-10-CM | POA: Diagnosis not present

## 2014-01-08 DIAGNOSIS — R351 Nocturia: Secondary | ICD-10-CM | POA: Diagnosis present

## 2014-01-08 DIAGNOSIS — E669 Obesity, unspecified: Secondary | ICD-10-CM | POA: Diagnosis present

## 2014-01-08 DIAGNOSIS — M1710 Unilateral primary osteoarthritis, unspecified knee: Secondary | ICD-10-CM | POA: Diagnosis present

## 2014-01-08 DIAGNOSIS — K219 Gastro-esophageal reflux disease without esophagitis: Secondary | ICD-10-CM | POA: Diagnosis present

## 2014-01-08 DIAGNOSIS — M1611 Unilateral primary osteoarthritis, right hip: Secondary | ICD-10-CM | POA: Diagnosis present

## 2014-01-08 DIAGNOSIS — G2581 Restless legs syndrome: Secondary | ICD-10-CM | POA: Diagnosis present

## 2014-01-08 DIAGNOSIS — G709 Myoneural disorder, unspecified: Secondary | ICD-10-CM | POA: Diagnosis present

## 2014-01-08 DIAGNOSIS — E1169 Type 2 diabetes mellitus with other specified complication: Secondary | ICD-10-CM

## 2014-01-08 DIAGNOSIS — E119 Type 2 diabetes mellitus without complications: Secondary | ICD-10-CM | POA: Diagnosis present

## 2014-01-08 DIAGNOSIS — M4806 Spinal stenosis, lumbar region: Secondary | ICD-10-CM | POA: Diagnosis present

## 2014-01-08 DIAGNOSIS — Z9889 Other specified postprocedural states: Secondary | ICD-10-CM | POA: Diagnosis present

## 2014-01-08 DIAGNOSIS — R112 Nausea with vomiting, unspecified: Secondary | ICD-10-CM | POA: Diagnosis present

## 2014-01-08 DIAGNOSIS — M5416 Radiculopathy, lumbar region: Secondary | ICD-10-CM

## 2014-01-08 DIAGNOSIS — I10 Essential (primary) hypertension: Secondary | ICD-10-CM | POA: Diagnosis present

## 2014-01-08 DIAGNOSIS — M179 Osteoarthritis of knee, unspecified: Secondary | ICD-10-CM | POA: Diagnosis present

## 2014-01-08 DIAGNOSIS — M48061 Spinal stenosis, lumbar region without neurogenic claudication: Secondary | ICD-10-CM | POA: Diagnosis present

## 2014-01-08 DIAGNOSIS — M25561 Pain in right knee: Secondary | ICD-10-CM | POA: Diagnosis present

## 2014-01-08 HISTORY — DX: Unilateral primary osteoarthritis, right knee: M17.11

## 2014-01-08 HISTORY — DX: Type 2 diabetes mellitus with other specified complication: E11.69

## 2014-01-08 HISTORY — DX: Type 2 diabetes mellitus with other specified complication: E66.9

## 2014-01-08 HISTORY — DX: Spinal stenosis, lumbar region without neurogenic claudication: M48.061

## 2014-01-08 HISTORY — PX: TOTAL KNEE ARTHROPLASTY: SHX125

## 2014-01-08 HISTORY — DX: Unilateral primary osteoarthritis, right hip: M16.11

## 2014-01-08 HISTORY — DX: Spinal stenosis, lumbar region without neurogenic claudication: M54.16

## 2014-01-08 LAB — CBC
HCT: 41.5 % (ref 39.0–52.0)
Hemoglobin: 14.8 g/dL (ref 13.0–17.0)
MCH: 31.4 pg (ref 26.0–34.0)
MCHC: 35.7 g/dL (ref 30.0–36.0)
MCV: 88.1 fL (ref 78.0–100.0)
Platelets: 263 10*3/uL (ref 150–400)
RBC: 4.71 MIL/uL (ref 4.22–5.81)
RDW: 13.5 % (ref 11.5–15.5)
WBC: 14 10*3/uL — ABNORMAL HIGH (ref 4.0–10.5)

## 2014-01-08 LAB — APTT: aPTT: 46 seconds — ABNORMAL HIGH (ref 24–37)

## 2014-01-08 LAB — GLUCOSE, CAPILLARY
Glucose-Capillary: 209 mg/dL — ABNORMAL HIGH (ref 70–99)
Glucose-Capillary: 243 mg/dL — ABNORMAL HIGH (ref 70–99)
Glucose-Capillary: 253 mg/dL — ABNORMAL HIGH (ref 70–99)
Glucose-Capillary: 252 mg/dL — ABNORMAL HIGH (ref 70–99)

## 2014-01-08 LAB — CREATININE, SERUM
Creatinine, Ser: 1.54 mg/dL — ABNORMAL HIGH (ref 0.50–1.35)
GFR calc Af Amer: 50 mL/min — ABNORMAL LOW (ref >90)
GFR calc non Af Amer: 43 mL/min — ABNORMAL LOW (ref >90)

## 2014-01-08 SURGERY — ARTHROPLASTY, KNEE, TOTAL
Anesthesia: Regional | Laterality: Right

## 2014-01-08 MED ORDER — METOCLOPRAMIDE HCL 5 MG/ML IJ SOLN: 5.0000 mg | Freq: Three times a day (TID) | INTRAMUSCULAR | Status: DC | PRN

## 2014-01-08 MED ORDER — ONDANSETRON HCL 4 MG/2ML IJ SOLN: 4.0000 mg | Freq: Four times a day (QID) | INTRAMUSCULAR | Status: DC | PRN

## 2014-01-08 MED ORDER — PHENOL 1.4 % MT LIQD: 1.0000 | OROMUCOSAL | Status: DC | PRN

## 2014-01-08 MED ORDER — MENTHOL 3 MG MT LOZG: 1.0000 | LOZENGE | OROMUCOSAL | Status: DC | PRN

## 2014-01-08 MED ORDER — INSULIN ASPART 100 UNIT/ML ~~LOC~~ SOLN
0.0000 [IU] | Freq: Three times a day (TID) | SUBCUTANEOUS | Status: DC
  Administered 2014-01-08: 7 [IU] via SUBCUTANEOUS
  Administered 2014-01-08: 11 [IU] via SUBCUTANEOUS
  Administered 2014-01-09: 15 [IU] via SUBCUTANEOUS
  Administered 2014-01-09: 7 [IU] via SUBCUTANEOUS
  Administered 2014-01-09: 11 [IU] via SUBCUTANEOUS
  Administered 2014-01-10: 7 [IU] via SUBCUTANEOUS
  Administered 2014-01-10 (×2): 4 [IU] via SUBCUTANEOUS
  Administered 2014-01-11: 7 [IU] via SUBCUTANEOUS
  Filled 2014-01-08 (×26): qty 0.2

## 2014-01-08 MED ORDER — FENTANYL CITRATE 0.05 MG/ML IJ SOLN
INTRAMUSCULAR | Status: AC
  Filled 2014-01-08: qty 5

## 2014-01-08 MED ORDER — DEXAMETHASONE SODIUM PHOSPHATE 10 MG/ML IJ SOLN
INTRAMUSCULAR | Status: AC
  Filled 2014-01-08: qty 1

## 2014-01-08 MED ORDER — ONDANSETRON HCL 4 MG/2ML IJ SOLN
INTRAMUSCULAR | Status: DC | PRN
  Administered 2014-01-08: 4 mg via INTRAVENOUS

## 2014-01-08 MED ORDER — DIPHENHYDRAMINE HCL 12.5 MG/5ML PO ELIX
12.5000 mg | ORAL_SOLUTION | ORAL | Status: DC | PRN
  Administered 2014-01-10: 25 mg via ORAL
  Filled 2014-01-08: qty 10

## 2014-01-08 MED ORDER — ENOXAPARIN SODIUM 30 MG/0.3ML ~~LOC~~ SOLN
30.0000 mg | Freq: Two times a day (BID) | SUBCUTANEOUS | Status: DC
  Administered 2014-01-09 – 2014-01-11 (×5): 30 mg via SUBCUTANEOUS
  Filled 2014-01-08 (×8): qty 0.3

## 2014-01-08 MED ORDER — PROPOFOL 10 MG/ML IV BOLUS
INTRAVENOUS | Status: AC
  Filled 2014-01-08: qty 20

## 2014-01-08 MED ORDER — BUPIVACAINE-EPINEPHRINE 0.25% -1:200000 IJ SOLN
INTRAMUSCULAR | Status: DC | PRN
  Administered 2014-01-08: 30 mL

## 2014-01-08 MED ORDER — BUPIVACAINE-EPINEPHRINE (PF) 0.5% -1:200000 IJ SOLN
INTRAMUSCULAR | Status: DC | PRN
  Administered 2014-01-08: 30 mL via PERINEURAL

## 2014-01-08 MED ORDER — GLYCOPYRROLATE 0.2 MG/ML IJ SOLN
INTRAMUSCULAR | Status: AC
  Filled 2014-01-08: qty 2

## 2014-01-08 MED ORDER — GLYCOPYRROLATE 0.2 MG/ML IJ SOLN
INTRAMUSCULAR | Status: AC
  Filled 2014-01-08: qty 1

## 2014-01-08 MED ORDER — HYDROMORPHONE HCL 1 MG/ML IJ SOLN
0.2500 mg | INTRAMUSCULAR | Status: DC | PRN
  Administered 2014-01-08 (×2): 0.5 mg via INTRAVENOUS

## 2014-01-08 MED ORDER — SODIUM CHLORIDE 0.9 % IJ SOLN
INTRAMUSCULAR | Status: AC
  Filled 2014-01-08: qty 10

## 2014-01-08 MED ORDER — OXYCODONE HCL 5 MG/5ML PO SOLN: 5.0000 mg | Freq: Once | ORAL | Status: DC | PRN

## 2014-01-08 MED ORDER — FLUTICASONE PROPIONATE 50 MCG/ACT NA SUSP
2.0000 | Freq: Every day | NASAL | Status: DC
  Administered 2014-01-08 – 2014-01-10 (×3): 2 via NASAL
  Filled 2014-01-08: qty 16

## 2014-01-08 MED ORDER — ROCURONIUM BROMIDE 100 MG/10ML IV SOLN
INTRAVENOUS | Status: DC | PRN
  Administered 2014-01-08: 50 mg via INTRAVENOUS

## 2014-01-08 MED ORDER — MIDAZOLAM HCL 5 MG/5ML IJ SOLN
INTRAMUSCULAR | Status: DC | PRN
  Administered 2014-01-08: 2 mg via INTRAVENOUS

## 2014-01-08 MED ORDER — ATORVASTATIN CALCIUM 10 MG PO TABS
10.0000 mg | ORAL_TABLET | Freq: Every day | ORAL | Status: DC
  Administered 2014-01-08 – 2014-01-10 (×3): 10 mg via ORAL
  Filled 2014-01-08 (×4): qty 1

## 2014-01-08 MED ORDER — PHENYLEPHRINE 40 MCG/ML (10ML) SYRINGE FOR IV PUSH (FOR BLOOD PRESSURE SUPPORT)
PREFILLED_SYRINGE | INTRAVENOUS | Status: AC
  Filled 2014-01-08: qty 10

## 2014-01-08 MED ORDER — ONDANSETRON HCL 4 MG/2ML IJ SOLN
INTRAMUSCULAR | Status: AC
  Filled 2014-01-08: qty 2

## 2014-01-08 MED ORDER — AMLODIPINE BESYLATE 10 MG PO TABS
10.0000 mg | ORAL_TABLET | Freq: Every day | ORAL | Status: DC
  Administered 2014-01-10 – 2014-01-11 (×2): 10 mg via ORAL
  Filled 2014-01-08 (×2): qty 1

## 2014-01-08 MED ORDER — INSULIN ASPART 100 UNIT/ML ~~LOC~~ SOLN
0.0000 [IU] | Freq: Every day | SUBCUTANEOUS | Status: DC
  Administered 2014-01-08 – 2014-01-10 (×3): 2 [IU] via SUBCUTANEOUS

## 2014-01-08 MED ORDER — PHENYLEPHRINE HCL 10 MG/ML IJ SOLN
INTRAMUSCULAR | Status: DC | PRN
  Administered 2014-01-08: 40 ug via INTRAVENOUS

## 2014-01-08 MED ORDER — POLYETHYLENE GLYCOL 3350 17 G PO PACK
17.0000 g | PACK | Freq: Two times a day (BID) | ORAL | Status: DC
  Administered 2014-01-08 – 2014-01-11 (×6): 17 g via ORAL
  Filled 2014-01-08 (×8): qty 1

## 2014-01-08 MED ORDER — SCOPOLAMINE 1 MG/3DAYS TD PT72
MEDICATED_PATCH | TRANSDERMAL | Status: DC | PRN
  Administered 2014-01-08: 1 via TRANSDERMAL

## 2014-01-08 MED ORDER — ROPINIROLE HCL 1 MG PO TABS
2.0000 mg | ORAL_TABLET | Freq: Every day | ORAL | Status: DC
  Administered 2014-01-08 – 2014-01-10 (×3): 2 mg via ORAL
  Filled 2014-01-08 (×4): qty 2

## 2014-01-08 MED ORDER — BUPIVACAINE-EPINEPHRINE (PF) 0.25% -1:200000 IJ SOLN
INTRAMUSCULAR | Status: AC
  Filled 2014-01-08: qty 30

## 2014-01-08 MED ORDER — TAMSULOSIN HCL 0.4 MG PO CAPS
0.4000 mg | ORAL_CAPSULE | Freq: Every day | ORAL | Status: DC
  Administered 2014-01-08 – 2014-01-11 (×4): 0.4 mg via ORAL
  Filled 2014-01-08 (×4): qty 1

## 2014-01-08 MED ORDER — INSULIN ASPART 100 UNIT/ML ~~LOC~~ SOLN
SUBCUTANEOUS | Status: AC
  Filled 2014-01-08: qty 7

## 2014-01-08 MED ORDER — DEXAMETHASONE SODIUM PHOSPHATE 10 MG/ML IJ SOLN
INTRAMUSCULAR | Status: DC | PRN
  Administered 2014-01-08: 10 mg via INTRAVENOUS

## 2014-01-08 MED ORDER — POTASSIUM CHLORIDE IN NACL 20-0.9 MEQ/L-% IV SOLN
INTRAVENOUS | Status: DC
  Filled 2014-01-08 (×4): qty 1000

## 2014-01-08 MED ORDER — VITAMIN D3 25 MCG (1000 UNIT) PO TABS
5000.0000 [IU] | ORAL_TABLET | Freq: Every day | ORAL | Status: DC
  Administered 2014-01-08 – 2014-01-11 (×4): 5000 [IU] via ORAL
  Filled 2014-01-08 (×4): qty 5

## 2014-01-08 MED ORDER — ALLOPURINOL 150 MG HALF TABLET
150.0000 mg | ORAL_TABLET | Freq: Every morning | ORAL | Status: DC
  Administered 2014-01-09 – 2014-01-11 (×3): 150 mg via ORAL
  Filled 2014-01-08 (×3): qty 1

## 2014-01-08 MED ORDER — HYDROMORPHONE HCL 1 MG/ML IJ SOLN
INTRAMUSCULAR | Status: AC
  Filled 2014-01-08: qty 1

## 2014-01-08 MED ORDER — INSULIN GLARGINE 100 UNIT/ML ~~LOC~~ SOLN
50.0000 [IU] | Freq: Every day | SUBCUTANEOUS | Status: DC
  Administered 2014-01-08 – 2014-01-11 (×4): 50 [IU] via SUBCUTANEOUS
  Filled 2014-01-08 (×4): qty 0.5

## 2014-01-08 MED ORDER — DOCUSATE SODIUM 100 MG PO CAPS
100.0000 mg | ORAL_CAPSULE | Freq: Two times a day (BID) | ORAL | Status: DC
  Administered 2014-01-08 – 2014-01-11 (×7): 100 mg via ORAL
  Filled 2014-01-08 (×6): qty 1

## 2014-01-08 MED ORDER — SUCCINYLCHOLINE CHLORIDE 20 MG/ML IJ SOLN
INTRAMUSCULAR | Status: AC
  Filled 2014-01-08: qty 1

## 2014-01-08 MED ORDER — LACTATED RINGERS IV SOLN
INTRAVENOUS | Status: DC | PRN
  Administered 2014-01-08 (×2): via INTRAVENOUS

## 2014-01-08 MED ORDER — ROCURONIUM BROMIDE 50 MG/5ML IV SOLN
INTRAVENOUS | Status: AC
  Filled 2014-01-08: qty 1

## 2014-01-08 MED ORDER — CEFUROXIME SODIUM 1.5 G IJ SOLR
INTRAMUSCULAR | Status: AC
  Filled 2014-01-08: qty 1.5

## 2014-01-08 MED ORDER — GLYCOPYRROLATE 0.2 MG/ML IJ SOLN
INTRAMUSCULAR | Status: DC | PRN
  Administered 2014-01-08: 0.4 mg via INTRAVENOUS

## 2014-01-08 MED ORDER — METOCLOPRAMIDE HCL 10 MG PO TABS: 5.0000 mg | ORAL_TABLET | Freq: Three times a day (TID) | ORAL | Status: DC | PRN

## 2014-01-08 MED ORDER — PROPOFOL 10 MG/ML IV BOLUS
INTRAVENOUS | Status: DC | PRN
  Administered 2014-01-08: 20 mg via INTRAVENOUS
  Administered 2014-01-08: 30 mg via INTRAVENOUS
  Administered 2014-01-08: 180 mg via INTRAVENOUS
  Administered 2014-01-08: 20 mg via INTRAVENOUS

## 2014-01-08 MED ORDER — CEFAZOLIN SODIUM-DEXTROSE 2-3 GM-% IV SOLR
2.0000 g | Freq: Four times a day (QID) | INTRAVENOUS | Status: AC
  Administered 2014-01-08 (×2): 2 g via INTRAVENOUS
  Filled 2014-01-08 (×3): qty 50

## 2014-01-08 MED ORDER — ACETAMINOPHEN 500 MG PO TABS
1000.0000 mg | ORAL_TABLET | Freq: Four times a day (QID) | ORAL | Status: AC
  Administered 2014-01-08 – 2014-01-09 (×3): 1000 mg via ORAL
  Filled 2014-01-08 (×3): qty 2

## 2014-01-08 MED ORDER — ALUM & MAG HYDROXIDE-SIMETH 200-200-20 MG/5ML PO SUSP: 30.0000 mL | ORAL | Status: DC | PRN

## 2014-01-08 MED ORDER — SODIUM CHLORIDE 0.9 % IR SOLN
Status: DC | PRN
  Administered 2014-01-08 (×2): 1000 mL

## 2014-01-08 MED ORDER — HYDROMORPHONE HCL 1 MG/ML IJ SOLN: 1.0000 mg | INTRAMUSCULAR | Status: DC | PRN

## 2014-01-08 MED ORDER — OXYCODONE HCL 5 MG PO TABS: 5.0000 mg | ORAL_TABLET | Freq: Once | ORAL | Status: DC | PRN

## 2014-01-08 MED ORDER — NEOSTIGMINE METHYLSULFATE 10 MG/10ML IV SOLN
INTRAVENOUS | Status: DC | PRN
  Administered 2014-01-08: 3 mg via INTRAVENOUS

## 2014-01-08 MED ORDER — LOSARTAN POTASSIUM 50 MG PO TABS
100.0000 mg | ORAL_TABLET | Freq: Every day | ORAL | Status: DC
  Administered 2014-01-08 – 2014-01-11 (×4): 100 mg via ORAL
  Filled 2014-01-08 (×4): qty 2

## 2014-01-08 MED ORDER — NEOSTIGMINE METHYLSULFATE 10 MG/10ML IV SOLN
INTRAVENOUS | Status: AC
  Filled 2014-01-08: qty 1

## 2014-01-08 MED ORDER — SCOPOLAMINE 1 MG/3DAYS TD PT72
MEDICATED_PATCH | TRANSDERMAL | Status: AC
  Filled 2014-01-08: qty 1

## 2014-01-08 MED ORDER — INSULIN ASPART 100 UNIT/ML ~~LOC~~ SOLN
4.0000 [IU] | Freq: Three times a day (TID) | SUBCUTANEOUS | Status: DC
  Administered 2014-01-09 – 2014-01-11 (×7): 4 [IU] via SUBCUTANEOUS

## 2014-01-08 MED ORDER — OXYCODONE HCL 5 MG PO TABS
5.0000 mg | ORAL_TABLET | ORAL | Status: DC | PRN
  Administered 2014-01-08 – 2014-01-11 (×11): 10 mg via ORAL
  Filled 2014-01-08 (×11): qty 2

## 2014-01-08 MED ORDER — EPHEDRINE SULFATE 50 MG/ML IJ SOLN
INTRAMUSCULAR | Status: AC
  Filled 2014-01-08: qty 1

## 2014-01-08 MED ORDER — DEXAMETHASONE SODIUM PHOSPHATE 10 MG/ML IJ SOLN
10.0000 mg | Freq: Three times a day (TID) | INTRAMUSCULAR | Status: AC
  Administered 2014-01-08 – 2014-01-09 (×3): 10 mg via INTRAVENOUS
  Filled 2014-01-08 (×3): qty 1

## 2014-01-08 MED ORDER — LIDOCAINE HCL (CARDIAC) 20 MG/ML IV SOLN
INTRAVENOUS | Status: AC
  Filled 2014-01-08: qty 5

## 2014-01-08 MED ORDER — FENTANYL CITRATE 0.05 MG/ML IJ SOLN
INTRAMUSCULAR | Status: DC | PRN
  Administered 2014-01-08: 50 ug via INTRAVENOUS
  Administered 2014-01-08: 25 ug via INTRAVENOUS
  Administered 2014-01-08: 100 ug via INTRAVENOUS
  Administered 2014-01-08: 25 ug via INTRAVENOUS
  Administered 2014-01-08 (×5): 50 ug via INTRAVENOUS

## 2014-01-08 MED ORDER — MIDAZOLAM HCL 2 MG/2ML IJ SOLN
INTRAMUSCULAR | Status: AC
  Filled 2014-01-08: qty 2

## 2014-01-08 MED ORDER — ONDANSETRON HCL 4 MG PO TABS: 4.0000 mg | ORAL_TABLET | Freq: Four times a day (QID) | ORAL | Status: DC | PRN

## 2014-01-08 SURGICAL SUPPLY — 66 items
BANDAGE ESMARK 6X9 LF (GAUZE/BANDAGES/DRESSINGS) ×1 IMPLANT
BENZOIN TINCTURE PRP APPL 2/3 (GAUZE/BANDAGES/DRESSINGS) ×2 IMPLANT
BLADE SAGITTAL 25.0X1.19X90 (BLADE) ×2 IMPLANT
BLADE SAW SGTL 11.0X1.19X90.0M (BLADE) IMPLANT
BLADE SAW SGTL 13.0X1.19X90.0M (BLADE) ×2 IMPLANT
BLADE SURG 10 STRL SS (BLADE) ×2 IMPLANT
BNDG ELASTIC 6X15 VLCR STRL LF (GAUZE/BANDAGES/DRESSINGS) ×2 IMPLANT
BNDG ESMARK 6X9 LF (GAUZE/BANDAGES/DRESSINGS) ×2
BOWL SMART MIX CTS (DISPOSABLE) ×2 IMPLANT
CAPT RP KNEE ×2 IMPLANT
CEMENT HV SMART SET (Cement) ×4 IMPLANT
CLSR STERI-STRIP ANTIMIC 1/2X4 (GAUZE/BANDAGES/DRESSINGS) ×2 IMPLANT
COVER SURGICAL LIGHT HANDLE (MISCELLANEOUS) ×2 IMPLANT
CUFF TOURNIQUET SINGLE 34IN LL (TOURNIQUET CUFF) ×2 IMPLANT
CUFF TOURNIQUET SINGLE 44IN (TOURNIQUET CUFF) IMPLANT
DRAPE EXTREMITY T 121X128X90 (DRAPE) ×2 IMPLANT
DRAPE INCISE IOBAN 66X45 STRL (DRAPES) ×2 IMPLANT
DRAPE PROXIMA HALF (DRAPES) ×2 IMPLANT
DRAPE U-SHAPE 47X51 STRL (DRAPES) ×2 IMPLANT
DRSG AQUACEL AG ADV 3.5X14 (GAUZE/BANDAGES/DRESSINGS) ×2 IMPLANT
DRSG PAD ABDOMINAL 8X10 ST (GAUZE/BANDAGES/DRESSINGS) ×2 IMPLANT
DURAPREP 26ML APPLICATOR (WOUND CARE) ×4 IMPLANT
ELECT CAUTERY BLADE 6.4 (BLADE) ×2 IMPLANT
ELECT REM PT RETURN 9FT ADLT (ELECTROSURGICAL) ×2
ELECTRODE REM PT RTRN 9FT ADLT (ELECTROSURGICAL) ×1 IMPLANT
EVACUATOR 1/8 PVC DRAIN (DRAIN) IMPLANT
FACESHIELD WRAPAROUND (MASK) ×2 IMPLANT
GAUZE SPONGE 4X4 12PLY STRL (GAUZE/BANDAGES/DRESSINGS) IMPLANT
GLOVE BIO SURGEON STRL SZ7 (GLOVE) ×2 IMPLANT
GLOVE BIOGEL PI IND STRL 7.0 (GLOVE) ×1 IMPLANT
GLOVE BIOGEL PI IND STRL 7.5 (GLOVE) ×1 IMPLANT
GLOVE BIOGEL PI INDICATOR 7.0 (GLOVE) ×1
GLOVE BIOGEL PI INDICATOR 7.5 (GLOVE) ×1
GLOVE SS BIOGEL STRL SZ 7.5 (GLOVE) ×1 IMPLANT
GLOVE SUPERSENSE BIOGEL SZ 7.5 (GLOVE) ×1
GOWN STRL REUS W/ TWL LRG LVL3 (GOWN DISPOSABLE) ×2 IMPLANT
GOWN STRL REUS W/ TWL XL LVL3 (GOWN DISPOSABLE) ×2 IMPLANT
GOWN STRL REUS W/TWL LRG LVL3 (GOWN DISPOSABLE) ×2
GOWN STRL REUS W/TWL XL LVL3 (GOWN DISPOSABLE) ×2
HANDPIECE INTERPULSE COAX TIP (DISPOSABLE) ×1
HOOD PEEL AWAY FACE SHEILD DIS (HOOD) ×4 IMPLANT
IMMOBILIZER KNEE 22 UNIV (SOFTGOODS) ×2 IMPLANT
KIT BASIN OR (CUSTOM PROCEDURE TRAY) ×2 IMPLANT
KIT ROOM TURNOVER OR (KITS) ×2 IMPLANT
MANIFOLD NEPTUNE II (INSTRUMENTS) ×2 IMPLANT
MARKER SKIN DUAL TIP RULER LAB (MISCELLANEOUS) ×2 IMPLANT
NS IRRIG 1000ML POUR BTL (IV SOLUTION) IMPLANT
PACK TOTAL JOINT (CUSTOM PROCEDURE TRAY) ×2 IMPLANT
PAD ARMBOARD 7.5X6 YLW CONV (MISCELLANEOUS) ×4 IMPLANT
PADDING CAST COTTON 6X4 STRL (CAST SUPPLIES) ×2 IMPLANT
RUBBERBAND STERILE (MISCELLANEOUS) IMPLANT
SET HNDPC FAN SPRY TIP SCT (DISPOSABLE) ×1 IMPLANT
SPONGE GAUZE 4X4 12PLY STER LF (GAUZE/BANDAGES/DRESSINGS) ×2 IMPLANT
STRIP CLOSURE SKIN 1/2X4 (GAUZE/BANDAGES/DRESSINGS) IMPLANT
SUCTION FRAZIER TIP 10 FR DISP (SUCTIONS) ×2 IMPLANT
SUT ETHIBOND NAB CT1 #1 30IN (SUTURE) ×4 IMPLANT
SUT MNCRL AB 3-0 PS2 18 (SUTURE) IMPLANT
SUT VIC AB 0 CT1 27 (SUTURE) ×1
SUT VIC AB 0 CT1 27XBRD ANBCTR (SUTURE) ×1 IMPLANT
SUT VIC AB 2-0 CT1 27 (SUTURE) ×2
SUT VIC AB 2-0 CT1 TAPERPNT 27 (SUTURE) ×2 IMPLANT
SYR 30ML SLIP (SYRINGE) ×2 IMPLANT
TOWEL OR 17X24 6PK STRL BLUE (TOWEL DISPOSABLE) ×2 IMPLANT
TOWEL OR 17X26 10 PK STRL BLUE (TOWEL DISPOSABLE) ×2 IMPLANT
TRAY FOLEY CATH 16FR SILVER (SET/KITS/TRAYS/PACK) ×2 IMPLANT
WATER STERILE IRR 1000ML POUR (IV SOLUTION) IMPLANT

## 2014-01-08 NOTE — Progress Notes (Signed)
Utilization review completed.  

## 2014-01-08 NOTE — Op Note (Signed)
MRN:     [id number] DOB/AGE:    06/03/1940 / 73 y.o.       OPERATIVE REPORT    DATE OF PROCEDURE:  01/08/2014       PREOPERATIVE DIAGNOSIS:   DJD RIGHT KNEE      Estimated body mass index is 38.45 kg/(m^2) as calculated from the following:   Height as of this encounter: 5\' 10"  (1.778 m).   Weight as of this encounter: 121.564 kg (268 lb).                                                        POSTOPERATIVE DIAGNOSIS:  Primary localized osteoarthritis DJD RIGHT KNEE                                                                      PROCEDURE:  Procedure(s): RIGHT TOTAL KNEE ARTHROPLASTY Using Depuy Sigma RP implants #6 Femur, #6Tibia, 12.33mm sigma RP bearing, 38 Patella     SURGEON: Shmuel Girgis A    ASSISTANT:  Kirstin Shepperson PA-C   (Present and scrubbed throughout the case, critical for assistance with exposure, retraction, instrumentation, and closure.)         ANESTHESIA: GET with Femoral Nerve Block  DRAINS: foley, 2 medium hemovac in knee   TOURNIQUET TIME: 95MWU   COMPLICATIONS:  None     SPECIMENS: None   INDICATIONS FOR PROCEDURE: The patient has  DJD RIGHT KNEE, varus deformities, XR shows bone on bone arthritis. Patient has failed all conservative measures including anti-inflammatory medicines, narcotics, attempts at  exercise and weight loss, cortisone injections and viscosupplementation.  Risks and benefits of surgery have been discussed, questions answered.   DESCRIPTION OF PROCEDURE: The patient identified by armband, received  right femoral nerve block and IV antibiotics, in the holding area at [facility]. Patient taken to the operating room, appropriate anesthetic  monitors were attached General endotracheal anesthesia induced with  the patient in supine position, Foley catheter was inserted. Tourniquet  applied high to the operative thigh. Lateral post and foot positioner  applied to the table, the lower extremity was then prepped and draped  in  usual sterile fashion from the ankle to the tourniquet. Time-out procedure was performed. The limb was wrapped with an Esmarch bandage and the tourniquet inflated to 365 mmHg. We began the operation by making the anterior midline incision starting at handbreadth above the patella going over the patella 1 cm medial to and  4 cm distal to the tibial tubercle. Small bleeders in the skin and the  subcutaneous tissue identified and cauterized. Transverse retinaculum was incised and reflected medially and a medial parapatellar arthrotomy was accomplished. the patella was everted and theprepatellar fat pad resected. The superficial medial collateral  ligament was then elevated from anterior to posterior along the proximal  flare of the tibia and anterior half of the menisci resected. The knee was hyperflexed exposing bone on bone arthritis. Peripheral and notch osteophytes as well as the cruciate ligaments were then resected. We continued to  work our way around posteriorly along the proximal  tibia, and externally  rotated the tibia subluxing it out from underneath the femur. A McHale  retractor was placed through the notch and a lateral Hohmann retractor  placed, and we then drilled through the proximal tibia in line with the  axis of the tibia followed by an intramedullary guide rod and 2-degree  posterior slope cutting guide. The tibial cutting guide was pinned into place  allowing resection of 4 mm of bone medially and about 6 mm of bone  laterally because of her varus deformity. Satisfied with the tibial resection, we then  entered the distal femur 2 mm anterior to the PCL origin with the  intramedullary guide rod and applied the distal femoral cutting guide  set at 4mm, with 5 degrees of valgus. This was pinned along the  epicondylar axis. At this point, the distal femoral cut was accomplished without difficulty. We then sized for a #6 femoral component and pinned the guide in 3 degrees of external  rotation.The chamfer cutting guide was pinned into place. The anterior, posterior, and chamfer cuts were accomplished without difficulty followed by  the Sigma RP box cutting guide and the box cut. We also removed posterior osteophytes from the posterior femoral condyles. At this  time, the knee was brought into full extension. We checked our  extension and flexion gaps and found them symmetric at 12.29mm.  The patella thickness measured at 27 mm. We set the cutting guide at 19 and removed the posterior 9.5-10 mm  of the patella sized for 38 button and drilled the lollipop. The knee  was then once again hyperflexed exposing the proximal tibia. We sized for a #6 tibial base plate, applied the smokestack and the conical reamer followed by the the Delta fin keel punch. We then hammered into place the Sigma RP trial femoral component, inserted a 12.5-mm trial bearing, trial patellar button, and took the knee through range of motion from 0-130 degrees. No thumb pressure was required for patellar  tracking. At this point, all trial components were removed, a double batch of DePuy HV cement was mixed and applied to all bony metallic mating surfaces except for the posterior condyles of the femur itself. In order, we  hammered into place the tibial tray and removed excess cement, the femoral component and removed excess cement, a 12.5-mm Sigma RP bearing  was inserted, and the knee brought to full extension with compression.  The patellar button was clamped into place, and excess cement  removed. While the cement cured the wound was irrigated out with normal saline solution pulse lavage, and medium Hemovac drains were placed.. Ligament stability and patellar tracking were checked and found to be excellent. The tourniquet was then released and hemostasis was obtained with cautery. The parapatellar arthrotomy was closed with  #1 ethibond suture. The subcutaneous tissue with 0 and 2-0 undyed  Vicryl suture, and 4-0  Monocryl.. A dressing of Xeroform,  4 x 4, dressing sponges, Webril, and Ace wrap applied. Needle and sponge count were correct times 2.The patient awakened, extubated, and taken to recovery room without difficulty. Vascular status was normal, pulses 2+ and symmetric.   Jadea Shiffer A 01/08/2014, 9:13 AM

## 2014-01-08 NOTE — Progress Notes (Signed)
Occupational Therapy Evaluation Patient Details Name: [name] MRN: [id number] DOB: 1940/12/29 Today's Date: 01/08/2014    History of Present Illness Pt s/p R TKA   Clinical Impression   PTA pt lived at home with wife and was independent with ADLs, however reports difficulty with LB dressing. Pt currently limited by pain and decreased ROM in Rt LE as well as weakness which impair his ability to be independent with LB ADLs and mobility. Pt will benefit from ST Rehab at SNF as well as acute OT to address LB ADLs.     Follow Up Recommendations  SNF;Supervision/Assistance - 24 hour    Equipment Recommendations  None recommended by OT    Recommendations for Other Services       Precautions / Restrictions Precautions Precautions: Knee Restrictions Weight Bearing Restrictions: Yes RLE Weight Bearing: Weight bearing as tolerated      Mobility Bed Mobility               General bed mobility comments: Deferred at this time. Pt would like to stay in CPM machine. Per PT note, pt at Supervision level.   Transfers                 General transfer comment: Deferred at this time. Pt would like to remain in CPM machine. Per PT note, pt at Supervision level.          ADL Overall ADL's : Needs assistance/impaired Eating/Feeding: Independent;Sitting   Grooming: Oral care;Wash/dry hands;Set up;Sitting   Upper Body Bathing: Set up;Sitting       Upper Body Dressing : Set up;Sitting                     General ADL Comments: Pt reports he just got into bed with CPM machine and would like to complete and hour or two. Bedside evaluation performed. Pt reports he was Independent with LB ADLs, however it was difficult to reach his RLE. Pt will likely be limited in LB ADLs due to pain and decreased ROM in Rt knee.      Vision  Pt reports no change from baseline.                    Perception Perception Perception Tested?: No   Praxis Praxis Praxis  tested?: Within functional limits    Pertinent Vitals/Pain Pain Assessment: No/denies pain     Hand Dominance Right   Extremity/Trunk Assessment Upper Extremity Assessment Upper Extremity Assessment: Overall WFL for tasks assessed   Lower Extremity Assessment Lower Extremity Assessment: Defer to PT evaluation   Cervical / Trunk Assessment Cervical / Trunk Assessment: Normal   Communication Communication Communication: No difficulties   Cognition Arousal/Alertness: Awake/alert Behavior During Therapy: WFL for tasks assessed/performed Overall Cognitive Status: Within Functional Limits for tasks assessed                                Home Living Family/patient expects to be discharged to:: Skilled nursing facility                                 Additional Comments: Pre-arranged to [employer]      Prior Functioning/Environment Level of Independence: Independent             OT Diagnosis: Generalized weakness;Acute pain   OT Problem List: Decreased strength;Decreased range  of motion;Decreased knowledge of use of DME or AE;Decreased knowledge of precautions;Pain   OT Treatment/Interventions: Self-care/ADL training;Therapeutic exercise;Energy conservation;DME and/or AE instruction;Therapeutic activities;Patient/family education;Balance training    OT Goals(Current goals can be found in the care plan section) Acute Rehab OT Goals Patient Stated Goal: return to golf OT Goal Formulation: With patient Time For Goal Achievement: 01/22/14 Potential to Achieve Goals: Good ADL Goals Pt Will Perform Grooming: with modified independence;standing Pt Will Transfer to Toilet: with modified independence;ambulating;bedside commode Pt Will Perform Toileting - Clothing Manipulation and hygiene: with modified independence;sit to/from stand  OT Frequency: Min 1X/week    End of Session Equipment Utilized During Treatment: Other (comment) (CPM) CPM Right  Knee CPM Right Knee: On Right Knee Flexion (Degrees): 90 Right Knee Extension (Degrees): 0  Activity Tolerance: Patient tolerated treatment well Patient left: in bed;with call bell/phone within reach;in CPM   Time: 8889-1694 OT Time Calculation (min): 22 min Charges:  OT General Charges $OT Visit: 1 Procedure OT Evaluation $Initial OT Evaluation Tier I: 1 Procedure OT Treatments $Self Care/Home Management : 8-22 mins  Villa Herb M 01/08/2014, 5:41 PM  Secundino Ginger Lynetta Mare, OTR/L Occupational Therapist [phone] (pager)

## 2014-01-08 NOTE — Progress Notes (Signed)
Orthopedic Tech Progress Note Patient Details:  [name] 04/02/1940 [id number] Applied CPM to RLE.  Applied OHF with trapeze to pt.'s bed.  Left Bone Foam with pt.'s nurse. CPM Right Knee CPM Right Knee: On Right Knee Flexion (Degrees): 90 Right Knee Extension (Degrees): 0   Darrol Poke 01/08/2014, 10:44 AM

## 2014-01-08 NOTE — Anesthesia Procedure Notes (Addendum)
Anesthesia Regional Block:  Adductor canal block  Pre-Anesthetic Checklist: ,, timeout performed, Correct Patient, Correct Site, Correct Laterality, Correct Procedure, Correct Position, site marked, Risks and benefits discussed,  Surgical consent,  Pre-op evaluation,  At surgeon's request and post-op pain management  Laterality: Right  Prep: chloraprep       Needles:  Injection technique: Single-shot  Needle Type: Echogenic Needle     Needle Length: 9cm 9 cm Needle Gauge: 21 and 21 G    Additional Needles:  Procedures: ultrasound guided (picture in chart) Adductor canal block Narrative:  Start time: 01/08/2014 7:04 AM End time: 01/08/2014 7:18 AM Injection made incrementally with aspirations every 5 mL.  Performed by: Personally  Anesthesiologist: Dr Marcie Bal  Additional Notes: Pt tolerated the procedure well.   Procedure Name: Intubation Date/Time: 01/08/2014 7:29 AM Performed by: Jenne Campus Pre-anesthesia Checklist: Patient identified, Emergency Drugs available, Suction available, Patient being monitored and Timeout performed Patient Re-evaluated:Patient Re-evaluated prior to inductionOxygen Delivery Method: Circle system utilized Preoxygenation: Pre-oxygenation with 100% oxygen Intubation Type: IV induction Laryngoscope Size: Miller and 3 Grade View: Grade II Tube type: Oral Tube size: 7.5 mm Number of attempts: 1 Airway Equipment and Method: Stylet Placement Confirmation: ETT inserted through vocal cords under direct vision,  positive ETCO2,  CO2 detector and breath sounds checked- equal and bilateral Secured at: 23 cm Tube secured with: Tape Dental Injury: Teeth and Oropharynx as per pre-operative assessment

## 2014-01-08 NOTE — Interval H&P Note (Signed)
History and Physical Interval Note:  01/08/2014 7:07 AM  [name]  has presented today for surgery, with the diagnosis of primary localized osteoarthritis DJD RIGHT KNEE  The various methods of treatment have been discussed with the patient and family. After consideration of risks, benefits and other options for treatment, the patient has consented to  Procedure(s): RIGHT TOTAL KNEE ARTHROPLASTY (Right) as a surgical intervention .  The patient's history has been reviewed, patient examined, no change in status, stable for surgery.  I have reviewed the patient's chart and labs.  Questions were answered to the patient's satisfaction.     Elsie Saas A

## 2014-01-08 NOTE — Progress Notes (Deleted)
Orthopedic Tech Progress Note Patient Details:  [name] [birth year] [id number] Applied CPM to RLE.  Applied OHF with trapeze to pt.'s bed.  Left Bone Foam with pt.'s nurse. CPM Right Knee CPM Right Knee: On Right Knee Flexion (Degrees): 90 Right Knee Extension (Degrees): 0   Darrol Poke 01/08/2014, 1:00 PM

## 2014-01-08 NOTE — Progress Notes (Deleted)
Orthopedic Tech Progress Note Patient Details:  [name] [birth year] [id number] Applied CPM to RLE.  Applied OHF with trapeze to pt.'s bed.  Left Bone Foam with pt.'s nurse  CPM Right Knee CPM Right Knee: On Right Knee Flexion (Degrees): 90 Right Knee Extension (Degrees): 0 Additional Comments: CPM off at [place], [name] 01/08/2014, 3:49 PM

## 2014-01-08 NOTE — Anesthesia Preprocedure Evaluation (Addendum)
Anesthesia Evaluation  Patient identified by MRN, date of birth, ID band Patient awake    Reviewed: Allergy & Precautions, H&P , NPO status , Patient's Chart, lab work & pertinent test results  History of Anesthesia Complications (+) PONV and history of anesthetic complications  Airway Mallampati: II TM Distance: >3 FB Neck ROM: full    Dental  (+) Teeth Intact, Dental Advisory Given   Pulmonary neg pulmonary ROS,  breath sounds clear to auscultation        Cardiovascular hypertension, Pt. on medications Rhythm:Regular Rate:Normal     Neuro/Psych  Neuromuscular disease negative psych ROS   GI/Hepatic Neg liver ROS, GERD-  Controlled,  Endo/Other  diabetes, Type 2, Insulin Dependentobese  Renal/GU Renal InsufficiencyRenal disease     Musculoskeletal  (+) Arthritis -,   Abdominal   Peds  Hematology   Anesthesia Other Findings   Reproductive/Obstetrics negative OB ROS                          Anesthesia Physical Anesthesia Plan  ASA: III  Anesthesia Plan: General and Regional   Post-op Pain Management: MAC Combined w/ Regional for Post-op pain   Induction: Intravenous  Airway Management Planned: Oral ETT  Additional Equipment:   Intra-op Plan:   Post-operative Plan: Extubation in OR  Informed Consent: I have reviewed the patients History and Physical, chart, labs and discussed the procedure including the risks, benefits and alternatives for the proposed anesthesia with the patient or authorized representative who has indicated his/her understanding and acceptance.     Plan Discussed with: CRNA, Anesthesiologist and Surgeon  Anesthesia Plan Comments:         Anesthesia Quick Evaluation

## 2014-01-08 NOTE — Evaluation (Signed)
Physical Therapy Evaluation Patient Details Name: [name] MRN: [id number] DOB: 1940-07-19 Today's Date: 01/08/2014   History of Present Illness  Pt s/p R TKA  Clinical Impression  Pt very pleasant and moving exceptionally well for POD#0. Pt with prior plans for SNF to maximize mobility after D/c. Pt educated for knee restriction, HEP, plan and progression. Pt will benefit from acute therapy to maximize ROM, strength, gait and function to decrease burden of care and return pt to goal of golf.    Follow Up Recommendations SNF (per pt and spouse request PTA)    Equipment Recommendations  Rolling walker with 5" wheels    Recommendations for Other Services       Precautions / Restrictions Precautions Precautions: Knee Precaution Booklet Issued: Yes (comment) Restrictions Weight Bearing Restrictions: Yes RLE Weight Bearing: Weight bearing as tolerated      Mobility  Bed Mobility Overal bed mobility: Needs Assistance Bed Mobility: Supine to Sit     Supine to sit: Supervision;HOB elevated     General bed mobility comments: no physical assist required  Transfers Overall transfer level: Needs assistance   Transfers: Sit to/from Stand Sit to Stand: Supervision         General transfer comment: cues for hand and RLE placement  Ambulation/Gait Ambulation/Gait assistance: Supervision Ambulation Distance (Feet): 90 Feet Assistive device: Rolling walker (2 wheeled) Gait Pattern/deviations: Step-to pattern;Decreased stance time - right   Gait velocity interpretation: Below normal speed for age/gender    Stairs            Wheelchair Mobility    Modified Rankin (Stroke Patients Only)       Balance                                             Pertinent Vitals/Pain Pain Assessment: No/denies pain    Home Living Family/patient expects to be discharged to:: Private residence Living Arrangements: Spouse/significant other Available  Help at Discharge: Family;Available 24 hours/day Type of Home: House Home Access: Ramped entrance     Home Layout: One level Home Equipment: Toilet riser;Shower seat;Adaptive equipment;Walker - standard;Cane - single point      Prior Function Level of Independence: Independent               Hand Dominance        Extremity/Trunk Assessment   Upper Extremity Assessment: Overall WFL for tasks assessed           Lower Extremity Assessment: RLE deficits/detail RLE Deficits / Details: limited due to post op however tolerating 90degree in CPM and AROM 75 degrees knee flexion    Cervical / Trunk Assessment: Normal  Communication   Communication: No difficulties  Cognition Arousal/Alertness: Awake/alert Behavior During Therapy: WFL for tasks assessed/performed Overall Cognitive Status: Within Functional Limits for tasks assessed                      General Comments      Exercises Total Joint Exercises Ankle Circles/Pumps: AROM;Both;5 reps;Supine Quad Sets: AROM;Right;5 reps;Supine Heel Slides: AROM;Right;5 reps;Supine Straight Leg Raises: AROM;Right;Supine;Other reps (comment) (1 rep without lag)      Assessment/Plan    PT Assessment Patient needs continued PT services  PT Diagnosis Difficulty walking;Acute pain   PT Problem List Decreased strength;Decreased range of motion;Decreased activity tolerance;Decreased knowledge of use of DME;Decreased mobility  PT Treatment  Interventions DME instruction;Gait training;Functional mobility training;Therapeutic activities;Therapeutic exercise;Patient/family education   PT Goals (Current goals can be found in the Care Plan section) Acute Rehab PT Goals Patient Stated Goal: return to golf PT Goal Formulation: With patient/family Time For Goal Achievement: 01/15/14 Potential to Achieve Goals: Good    Frequency 7X/week   Barriers to discharge Decreased caregiver support pt reports wife had THA and is  recovering and does not feel she can physically assist at home    Co-evaluation               End of Session   Activity Tolerance: Patient tolerated treatment well Patient left: in chair;with call bell/phone within reach;with family/visitor present Nurse Communication: Mobility status;Weight bearing status;Precautions         Time: 7482-7078 PT Time Calculation (min): 20 min   Charges:   PT Evaluation $Initial PT Evaluation Tier I: 1 Procedure PT Treatments $Gait Training: 8-22 mins   PT G CodesMelford Aase 01/08/2014, 1:30 PM Elwyn Reach, [place]

## 2014-01-08 NOTE — Anesthesia Postprocedure Evaluation (Addendum)
  Anesthesia Post-op Note  Patient: [name]  Procedure(s) Performed: Procedure(s): RIGHT TOTAL KNEE ARTHROPLASTY (Right)  Patient Location: PACU  Anesthesia Type:General and Regional  Level of Consciousness: awake and alert   Airway and Oxygen Therapy: Patient Spontanous Breathing  Post-op Pain: mild  Post-op Assessment: Post-op Vital signs reviewed, Patient's Cardiovascular Status Stable, Respiratory Function Stable, Patent Airway, No signs of Nausea or vomiting and Pain level controlled  Post-op Vital Signs: Reviewed and stable  Last Vitals:  Filed Vitals:   01/08/14 1130  BP:   Pulse: 100  Temp: 36.7 C  Resp: 15    Complications: No apparent anesthesia complications

## 2014-01-08 NOTE — Transfer of Care (Signed)
Immediate Anesthesia Transfer of Care Note  Patient: [name]  Procedure(s) Performed: Procedure(s): RIGHT TOTAL KNEE ARTHROPLASTY (Right)  Patient Location: PACU  Anesthesia Type:General  Level of Consciousness: awake, oriented and patient cooperative  Airway & Oxygen Therapy: Patient Spontanous Breathing and Patient connected to nasal cannula oxygen  Post-op Assessment: Report given to PACU RN and Post -op Vital signs reviewed and stable  Post vital signs: Reviewed  Complications: No apparent anesthesia complications

## 2014-01-09 LAB — CBC
HCT: 35.6 % — ABNORMAL LOW (ref 39.0–52.0)
Hemoglobin: 13 g/dL (ref 13.0–17.0)
MCH: 31.9 pg (ref 26.0–34.0)
MCHC: 36.5 g/dL — ABNORMAL HIGH (ref 30.0–36.0)
MCV: 87.5 fL (ref 78.0–100.0)
Platelets: 324 10*3/uL (ref 150–400)
RBC: 4.07 MIL/uL — ABNORMAL LOW (ref 4.22–5.81)
RDW: 13.7 % (ref 11.5–15.5)
WBC: 12.3 10*3/uL — ABNORMAL HIGH (ref 4.0–10.5)

## 2014-01-09 LAB — GLUCOSE, CAPILLARY
Glucose-Capillary: 220 mg/dL — ABNORMAL HIGH (ref 70–99)
Glucose-Capillary: 289 mg/dL — ABNORMAL HIGH (ref 70–99)
Glucose-Capillary: 318 mg/dL — ABNORMAL HIGH (ref 70–99)
Glucose-Capillary: 235 mg/dL — ABNORMAL HIGH (ref 70–99)

## 2014-01-09 LAB — BASIC METABOLIC PANEL
Anion gap: 17 — ABNORMAL HIGH (ref 5–15)
BUN: 34 mg/dL — ABNORMAL HIGH (ref 6–23)
CO2: 18 mEq/L — ABNORMAL LOW (ref 19–32)
Calcium: 9 mg/dL (ref 8.4–10.5)
Chloride: 100 mEq/L (ref 96–112)
Creatinine, Ser: 1.54 mg/dL — ABNORMAL HIGH (ref 0.50–1.35)
GFR calc Af Amer: 50 mL/min — ABNORMAL LOW (ref >90)
GFR calc non Af Amer: 43 mL/min — ABNORMAL LOW (ref >90)
Glucose, Bld: 223 mg/dL — ABNORMAL HIGH (ref 70–99)
Potassium: 4.3 mEq/L (ref 3.7–5.3)
Sodium: 135 mEq/L — ABNORMAL LOW (ref 137–147)

## 2014-01-09 MED ORDER — SODIUM CHLORIDE 0.9 % IV SOLN
INTRAVENOUS | Status: DC
  Administered 2014-01-09: 12:00:00 via INTRAVENOUS

## 2014-01-09 NOTE — Care Management Note (Signed)
CARE MANAGEMENT NOTE 01/09/2014  Patient:  RENLEY, BANWART   Account Number:  [id number]  Date Initiated:  01/09/2014  Documentation initiated by:  Ricki Miller  Subjective/Objective Assessment:   73 yr old male admitted with right knee DJD. Patient underwent right total knee arthroplasty.     Action/Plan:   Case manager spoke with patient concerning discharge plans. Patient states he will go for shortterm rehab at [facility]. Social worker notified. PT has assessed. patient.   Anticipated DC Date:  01/10/2014   Anticipated DC Plan:  SKILLED NURSING FACILITY  In-house referral  Clinical Social Worker      DC Planning Services  CM consult      [facility] Choice  NA   Choice offered to / List presented to:     DME arranged  CPM      DME agency  [employer]     HH arranged  NA      Status of service:  Completed, signed off Medicare Important Message given?  NA - LOS <3 / Initial given by admissions (If response is "NO", the following Medicare IM given date fields will be blank) Date Medicare IM given:   Medicare IM given by:   Date Additional Medicare IM given:   Additional Medicare IM given by:    Discharge Disposition:  [place]  Per UR Regulation:  Reviewed for med. necessity/level of care/duration of stay  If discussed at [place] of Stay Meetings, dates discussed:

## 2014-01-09 NOTE — Progress Notes (Signed)
Physical Therapy Treatment Patient Details Name: DAYLEN LIPSKY MRN: [id number] DOB: 10-Nov-1940 Today's Date: 01/09/2014    History of Present Illness Pt s/p R TKA    PT Comments    Pt continues to progress very well with increased ambulation distance this afternoon.  Pt requesting to return to bed for CPM.    Follow Up Recommendations  SNF     Equipment Recommendations  Rolling walker with 5" wheels    Recommendations for Other Services       Precautions / Restrictions Precautions Precautions: Knee Precaution Booklet Issued: Yes (comment) Restrictions Weight Bearing Restrictions: Yes RLE Weight Bearing: Weight bearing as tolerated    Mobility  Bed Mobility Overal bed mobility: Modified Independent Bed Mobility: Supine to Sit     Supine to sit: Modified independent (Device/Increase time)        Transfers Overall transfer level: Needs assistance Equipment used: Rolling walker (2 wheeled) Transfers: Sit to/from Stand Sit to Stand: Supervision         General transfer comment: Min cues for hand placement  Ambulation/Gait Ambulation/Gait assistance: Supervision Ambulation Distance (Feet): 300 Feet Assistive device: Rolling walker (2 wheeled) Gait Pattern/deviations: Step-through pattern;Decreased stride length;Decreased stance time - left;Antalgic Gait velocity: decreased   General Gait Details: min cues for safety when turning.    Stairs            Wheelchair Mobility    Modified Rankin (Stroke Patients Only)       Balance                                    Cognition Arousal/Alertness: Awake/alert Behavior During Therapy: WFL for tasks assessed/performed Overall Cognitive Status: Within Functional Limits for tasks assessed                      Exercises      General Comments        Pertinent Vitals/Pain Pain Assessment: 0-10 Pain Score: 2  Pain Location: R knee Pain Descriptors / Indicators:  Aching Pain Intervention(s): Repositioned    Home Living                      Prior Function            PT Goals (current goals can now be found in the care plan section) Acute Rehab PT Goals Patient Stated Goal: return to golf PT Goal Formulation: With patient/family Time For Goal Achievement: 01/15/14 Potential to Achieve Goals: Good Progress towards PT goals: Progressing toward goals    Frequency  7X/week    PT Plan Current plan remains appropriate    Co-evaluation             End of Session   Activity Tolerance: Patient tolerated treatment well Patient left: in bed;with call bell/phone within reach;in CPM     Time: 0630-1601 PT Time Calculation (min): 23 min  Charges:  $Gait Training: 23-37 mins                    G Codes:      Denice Bors 01/09/2014, 4:25 PM

## 2014-01-09 NOTE — Progress Notes (Signed)
Subjective: 1 Day Post-Op Procedure(s) (LRB): RIGHT TOTAL KNEE ARTHROPLASTY (Right) Patient reports pain as 3 on 0-10 scale.    Objective: Vital signs in last 24 hours: Temp:  [97.4 F (36.3 C)-98.1 F (36.7 C)] 97.4 F (36.3 C) (10/13 0500) Pulse Rate:  [86-114] 100 (10/13 0500) Resp:  [14-19] 18 (10/13 0500) BP: (118-148)/(62-76) 123/72 mmHg (10/13 0500) SpO2:  [91 %-98 %] 91 % (10/13 0500) Weight:  [116.121 kg (256 lb)] 116.121 kg (256 lb) (10/12 1643)  Intake/Output from previous day: 10/12 0701 - 10/13 0700 In: 2150 [I.V.:1950] Out: 5125 [Urine:4950; Drains:150; Blood:25] Intake/Output this shift:     Recent Labs  01/08/14 1325 01/09/14 0551  HGB 14.8 13.0    Recent Labs  01/08/14 1325 01/09/14 0551  WBC 14.0* 12.3*  RBC 4.71 4.07*  HCT 41.5 35.6*  PLT 263 324    Recent Labs  01/08/14 1325 01/09/14 0551  NA  --  135*  K  --  4.3  CL  --  100  CO2  --  18*  BUN  --  34*  CREATININE 1.54* 1.54*  GLUCOSE  --  223*  CALCIUM  --  9.0   No results found for this basename: LABPT, INR,  in the last 72 hours  ABD soft Neurovascular intact Sensation intact distally Intact pulses distally Dorsiflexion/Plantar flexion intact Incision: dressing C/D/I  Assessment/Plan: 1 Day Post-Op Procedure(s) (LRB): RIGHT TOTAL KNEE ARTHROPLASTY (Right) Advance diet Up with therapy Discharge to SNF either Wednesday or Thursday  Delmy Holdren J 01/09/2014, 9:22 AM

## 2014-01-09 NOTE — Progress Notes (Signed)
Lovenox 

## 2014-01-09 NOTE — Progress Notes (Signed)
Physical Therapy Treatment Patient Details Name: [name] MRN: [id number] DOB: 11/17/1940 Today's Date: 01/09/2014    History of Present Illness Pt s/p R TKA    PT Comments    Pt making great progress with all aspects of mobility and introduced education to negotiation of stairs.  Also performed exercises very well during session.  States he is going to rehab due to his wife getting a hip replaced recently.    Follow Up Recommendations  SNF (per pt request)     Equipment Recommendations  Rolling walker with 5" wheels    Recommendations for Other Services       Precautions / Restrictions Precautions Precautions: Knee Restrictions Weight Bearing Restrictions: Yes RLE Weight Bearing: Weight bearing as tolerated    Mobility  Bed Mobility Overal bed mobility: Modified Independent Bed Mobility: Supine to Sit     Supine to sit: Modified independent (Device/Increase time)     General bed mobility comments: Needed use of rail, but did with HOB flat  Transfers Overall transfer level: Needs assistance Equipment used: Rolling walker (2 wheeled) Transfers: Sit to/from Stand Sit to Stand: Supervision         General transfer comment: Min cues for hand placement  Ambulation/Gait Ambulation/Gait assistance: Supervision Ambulation Distance (Feet): 200 Feet Assistive device: Rolling walker (2 wheeled) Gait Pattern/deviations: Step-through pattern;Decreased stride length;Decreased stance time - right;Antalgic Gait velocity: decreased   General Gait Details: Min cues for upright posture and remaining close to RW during gait.  Also cues for maintaining position of RW when ambulating into restroom for safety.    Stairs Stairs: Yes Stairs assistance: Supervision Stair Management: Two rails;Step to pattern;Forwards Number of Stairs: 5 General stair comments: Performed stairs with min cues for sequencing/tecchnique to begin education.    Wheelchair Mobility     Modified Rankin (Stroke Patients Only)       Balance                                    Cognition Arousal/Alertness: Awake/alert Behavior During Therapy: WFL for tasks assessed/performed Overall Cognitive Status: Within Functional Limits for tasks assessed                      Exercises Total Joint Exercises Ankle Circles/Pumps: AROM;Both;10 reps;Seated Quad Sets: AROM;Strengthening;Both;10 reps;Seated Heel Slides: AAROM;Right;10 reps;Seated Hip ABduction/ADduction: AAROM;Right;10 reps;Seated Straight Leg Raises: AAROM;Right;10 reps;Seated    General Comments        Pertinent Vitals/Pain Pain Assessment: 0-10 Pain Score: 3  Pain Location: R knee Pain Descriptors / Indicators: Aching Pain Intervention(s): Repositioned;Ice applied    Home Living                      Prior Function            PT Goals (current goals can now be found in the care plan section) Acute Rehab PT Goals Patient Stated Goal: return to golf PT Goal Formulation: With patient/family Time For Goal Achievement: 01/15/14 Potential to Achieve Goals: Good Progress towards PT goals: Progressing toward goals    Frequency  7X/week    PT Plan Current plan remains appropriate    Co-evaluation             End of Session   Activity Tolerance: Patient tolerated treatment well Patient left: in chair;with call bell/phone within reach     Time: 1791-5056 PT Time  Calculation (min): 26 min  Charges:  $Gait Training: 8-22 mins $Therapeutic Exercise: 8-22 mins                    G Codes:      Denice Bors 01/09/2014, 11:28 AM

## 2014-01-10 ENCOUNTER — Encounter (HOSPITAL_COMMUNITY): Payer: Self-pay | Admitting: Orthopedic Surgery

## 2014-01-10 LAB — CBC
HCT: 35.6 % — ABNORMAL LOW (ref 39.0–52.0)
Hemoglobin: 12.6 g/dL — ABNORMAL LOW (ref 13.0–17.0)
MCH: 31.8 pg (ref 26.0–34.0)
MCHC: 35.4 g/dL (ref 30.0–36.0)
MCV: 89.9 fL (ref 78.0–100.0)
Platelets: 256 10*3/uL (ref 150–400)
RBC: 3.96 MIL/uL — ABNORMAL LOW (ref 4.22–5.81)
RDW: 13.5 % (ref 11.5–15.5)
WBC: 11.2 10*3/uL — ABNORMAL HIGH (ref 4.0–10.5)

## 2014-01-10 LAB — BASIC METABOLIC PANEL
Anion gap: 14 (ref 5–15)
BUN: 35 mg/dL — ABNORMAL HIGH (ref 6–23)
CO2: 23 mEq/L (ref 19–32)
Calcium: 8.8 mg/dL (ref 8.4–10.5)
Chloride: 102 mEq/L (ref 96–112)
Creatinine, Ser: 1.49 mg/dL — ABNORMAL HIGH (ref 0.50–1.35)
GFR calc Af Amer: 52 mL/min — ABNORMAL LOW (ref >90)
GFR calc non Af Amer: 45 mL/min — ABNORMAL LOW (ref >90)
Glucose, Bld: 151 mg/dL — ABNORMAL HIGH (ref 70–99)
Potassium: 3.7 mEq/L (ref 3.7–5.3)
Sodium: 139 mEq/L (ref 137–147)

## 2014-01-10 LAB — GLUCOSE, CAPILLARY
Glucose-Capillary: 157 mg/dL — ABNORMAL HIGH (ref 70–99)
Glucose-Capillary: 175 mg/dL — ABNORMAL HIGH (ref 70–99)
Glucose-Capillary: 203 mg/dL — ABNORMAL HIGH (ref 70–99)
Glucose-Capillary: 206 mg/dL — ABNORMAL HIGH (ref 70–99)

## 2014-01-10 MED ORDER — DSS 100 MG PO CAPS: 100.0000 mg | ORAL_CAPSULE | Freq: Two times a day (BID) | ORAL | Status: DC

## 2014-01-10 MED ORDER — ENOXAPARIN SODIUM 30 MG/0.3ML ~~LOC~~ SOLN: 40.0000 mg | Freq: Two times a day (BID) | SUBCUTANEOUS | Status: DC

## 2014-01-10 MED ORDER — POLYETHYLENE GLYCOL 3350 17 G PO PACK: 17.0000 g | PACK | Freq: Two times a day (BID) | ORAL | Status: DC

## 2014-01-10 MED ORDER — OXYCODONE HCL 5 MG PO TABS: ORAL_TABLET | ORAL | Status: DC

## 2014-01-10 NOTE — Progress Notes (Signed)
Subjective: 2 Days Post-Op Procedure(s) (LRB): RIGHT TOTAL KNEE ARTHROPLASTY (Right) Patient reports pain as 4 on 0-10 scale.    Objective: Vital signs in last 24 hours: Temp:  [97.6 F (36.4 C)-97.9 F (36.6 C)] 97.9 F (36.6 C) (10/14 0530) Pulse Rate:  [83-98] 83 (10/14 0530) Resp:  [16-18] 16 (10/14 0530) BP: (122-161)/(72-88) 161/88 mmHg (10/14 0530) SpO2:  [91 %-95 %] 94 % (10/14 0530)  Intake/Output from previous day: 10/13 0701 - 10/14 0700 In: 2040 [P.O.:840; I.V.:1200] Out: 1150 [Urine:1150] Intake/Output this shift:     Recent Labs  01/08/14 1325 01/09/14 0551 01/10/14 0500  HGB 14.8 13.0 12.6*    Recent Labs  01/09/14 0551 01/10/14 0500  WBC 12.3* 11.2*  RBC 4.07* 3.96*  HCT 35.6* 35.6*  PLT 324 256    Recent Labs  01/09/14 0551 01/10/14 0500  NA 135* 139  K 4.3 3.7  CL 100 102  CO2 18* 23  BUN 34* 35*  CREATININE 1.54* 1.49*  GLUCOSE 223* 151*  CALCIUM 9.0 8.8   No results found for this basename: LABPT, INR,  in the last 72 hours  ABD soft Neurovascular intact Sensation intact distally Intact pulses distally Dorsiflexion/Plantar flexion intact Incision: moderate drainage  Assessment/Plan: 2 Days Post-Op Procedure(s) (LRB): RIGHT TOTAL KNEE ARTHROPLASTY (Right) Advance diet Up with therapy SNF vs home depending on insurance.  Carlen Fils J 01/10/2014, 8:14 AM

## 2014-01-10 NOTE — Discharge Summary (Addendum)
Patient ID: [name] MRN: [id number] DOB/AGE: 1941-02-13 73 y.o.  Admit date: 01/08/2014 Discharge date: 01/11/2014  Admission Diagnoses:  Principal Problem:   Primary osteoarthritis of right knee Active Problems:   Neuromuscular disorder   Lumbar stenosis   Primary localized osteoarthritis of right hip   Spinal stenosis of lumbar region with radiculopathy   Diabetes mellitus type 2 in obese   PONV (postoperative nausea and vomiting)   GERD (gastroesophageal reflux disease)   Frequent urination at night   DJD (degenerative joint disease) of knee   Discharge Diagnoses:  Same  Past Medical History  Diagnosis Date  . Kidney stones   . Hypertension   . Diabetes mellitus type 2 in obese   . PONV (postoperative nausea and vomiting)   . GERD (gastroesophageal reflux disease)     occ indigestion  . Arthritis     arthirtis all over  . Neuromuscular disorder     restless leg syndrome  . Right knee DJD   . Hyperlipemia   . Frequent urination at night   . Primary osteoarthritis of right knee   . Primary localized osteoarthritis of right hip   . Spinal stenosis of lumbar region with radiculopathy   . PONV (postoperative nausea and vomiting)   . Glaucoma     both eyes    Surgeries: Procedure(s): RIGHT TOTAL KNEE ARTHROPLASTY on 01/08/2014   Consultants:    Discharged Condition: Improved  Hospital Course: [name] is an 73 y.o. male who was admitted 01/08/2014 for operative treatment ofPrimary osteoarthritis of right knee. Patient has severe unremitting pain that affects sleep, daily activities, and work/hobbies. After pre-op clearance the patient was taken to the operating room on 01/08/2014 and underwent  Procedure(s): RIGHT TOTAL KNEE ARTHROPLASTY.    Patient was given perioperative antibiotics:     Anti-infectives   Start     Dose/Rate Route Frequency Ordered Stop   01/08/14 1300  ceFAZolin (ANCEF) IVPB 2 g/50 mL premix     2 g 100 mL/hr over 30  Minutes Intravenous Every 6 hours 01/08/14 1213 01/08/14 1951   01/08/14 0600  ceFAZolin (ANCEF) 3 g in dextrose 5 % 50 mL IVPB     3 g 160 mL/hr over 30 Minutes Intravenous On call to O.R. 01/07/14 1301 01/08/14 9563       Patient was given sequential compression devices, early ambulation, and chemoprophylaxis to prevent DVT.  Patient benefited maximally from hospital stay and there were no complications.    Recent vital signs:  Patient Vitals for the past 24 hrs:  BP Temp Pulse Resp SpO2  01/11/14 0540 146/82 mmHg 98.8 F (37.1 C) 86 16 95 %  01/10/14 2047 152/77 mmHg 98.7 F (37.1 C) 99 16 95 %  01/10/14 2000 - - - 18 -  01/10/14 1600 148/78 mmHg 98.8 F (37.1 C) 109 16 94 %  01/10/14 1200 - - - 16 94 %     Recent laboratory studies:   Recent Labs  01/10/14 0500 01/11/14 0517  WBC 11.2* 7.7  HGB 12.6* 12.9*  HCT 35.6* 36.4*  PLT 256 239  NA 139 137  K 3.7 3.7  CL 102 100  CO2 23 21  BUN 35* 28*  CREATININE 1.49* 1.27  GLUCOSE 151* 182*  CALCIUM 8.8 8.7     Discharge Medications:     Medication List    STOP taking these medications       aspirin EC 81 MG tablet  Fish Oil 8756 MG Caps     Garlic 433 MG Tabs     meloxicam 15 MG tablet  Commonly known as:  MOBIC     Saw Palmetto 450 MG Caps     traMADol 50 MG tablet  Commonly known as:  ULTRAM     ZINC-MAGNESIUM ASPART-VIT B6 PO      TAKE these medications       allopurinol 300 MG tablet  Commonly known as:  ZYLOPRIM  Take 150 mg by mouth every morning.     amLODipine 10 MG tablet  Commonly known as:  NORVASC  Take 10 mg by mouth daily.     atorvastatin 10 MG tablet  Commonly known as:  LIPITOR  Take 10 mg by mouth at bedtime.     DSS 100 MG Caps  Take 100 mg by mouth 2 (two) times daily.     enoxaparin 30 MG/0.3ML injection  Commonly known as:  LOVENOX  Inject 0.3 mLs (30 mg total) into the skin every 12 (twelve) hours.     fluticasone 50 MCG/ACT nasal spray  Commonly known  as:  FLONASE  Place into both nostrils daily.     gemfibrozil 600 MG tablet  Commonly known as:  LOPID  Take 600 mg by mouth 2 (two) times daily.     insulin glargine 100 UNIT/ML injection  Commonly known as:  LANTUS  Inject 50 Units into the skin daily.     INVOKANA 300 MG Tabs  Generic drug:  Canagliflozin  Take 300 mg by mouth daily.     losartan 100 MG tablet  Commonly known as:  COZAAR  Take 100 mg by mouth daily.     metformin 1000 MG (OSM) 24 hr tablet  Commonly known as:  FORTAMET  Take 1,000 mg by mouth daily with breakfast.     oxyCODONE 5 MG immediate release tablet  Commonly known as:  Oxy IR/ROXICODONE  1-2 tablets every 4-6 hrs as needed for pain     polyethylene glycol packet  Commonly known as:  MIRALAX / GLYCOLAX  Take 17 g by mouth 2 (two) times daily.     rOPINIRole 2 MG tablet  Commonly known as:  REQUIP  Take 2 mg by mouth at bedtime.     tamsulosin 0.4 MG Caps capsule  Commonly known as:  FLOMAX  Take 0.4 mg by mouth daily.     Vitamin D-3 5000 UNITS Tabs  Take 5,000 Units by mouth daily.     VITRUM 50+ SENIOR MULTI PO  Take 1 tablet by mouth daily.        Diagnostic Studies: No results found.  Disposition: 01-Home or Self Care  Discharge Instructions   CPM    Complete by:  As directed   Continuous passive motion machine (CPM):      Use the CPM from 0 to 90 for 6 hours per day.       You may break it up into 2 or 3 sessions per day.      Use CPM for 2 weeks or until you are told to stop.     Call MD / Call 911    Complete by:  As directed   If you experience chest pain or shortness of breath, CALL 911 and be transported to the hospital emergency room.  If you develope a fever above 101 F, pus (white drainage) or increased drainage or redness at the wound, or calf pain, call your surgeon's office.  Change dressing    Complete by:  As directed   Change the dressing daily with sterile 4 x 4 inch gauze dressing and apply TED hose.   You may clean the incision with alcohol prior to redressing.     Constipation Prevention    Complete by:  As directed   Drink plenty of fluids.  Prune juice may be helpful.  You may use a stool softener, such as Colace (over the counter) 100 mg twice a day.  Use MiraLax (over the counter) for constipation as needed.     Diet - low sodium heart healthy    Complete by:  As directed      Discharge instructions    Complete by:  As directed   Total Knee Replacement Care After Refer to this sheet in the next few weeks. These discharge instructions provide you with general information on caring for yourself after you leave the hospital. Your caregiver may also give you specific instructions. Your treatment has been planned according to the most current medical practices available, but unavoidable complications sometimes occur. If you have any problems or questions after discharge, please call your caregiver. Regaining a near full range of motion of your knee within the first 3 to 6 weeks after surgery is critical. [place] may resume a normal diet and activities as directed.  Perform exercises as directed.  Place gray foam block, curve side up under heel at all times except when in CPM or when walking.  DO NOT modify, tear, cut, or change in any way the gray foam block. You will receive physical therapy daily  Take showers instead of baths until informed otherwise.  You may shower on Sunday.  Please wash whole leg including wound with soap and water  DO NOT REMOVE BANDAGE OVER SURGICAL INCISION.  [place] WHOLE LEG INCLUDING OVER THE WATERPROOF BANDAGE WITH SOAP AND WATER EVERY DAY. It is OK to take over-the-counter tylenol in addition to the oxycodone for pain, discomfort, or fever. Oxycodone is VERY constipating.  Please take stool softener twice a day and laxatives daily until bowels are regular Eat a well-balanced diet.  Avoid lifting or driving until you are instructed otherwise.   Make an appointment to see your caregiver for stitches (suture) or staple removal as directed.  If you have been sent home with a continuous passive motion machine (CPM machine), 0-90 degrees 6 hrs a day   2 hrs a shift SEEK MEDICAL CARE IF: You have swelling of your calf or leg.  You develop shortness of breath or chest pain.  You have redness, swelling, or increasing pain in the wound.  There is pus or any unusual drainage coming from the surgical site.  You notice a bad smell coming from the surgical site or dressing.  The surgical site breaks open after sutures or staples have been removed.  There is persistent bleeding from the suture or staple line.  You are getting worse or are not improving.  You have any other questions or concerns.  SEEK IMMEDIATE MEDICAL CARE IF:  You have a fever.  You develop a rash.  You have difficulty breathing.  You develop any reaction or side effects to medicines given.  Your knee motion is decreasing rather than improving.  MAKE SURE YOU:  Understand these instructions.  Will watch your condition.  Will get help right away if you are not doing well or get worse.     Do not put a pillow  under the knee. Place it under the heel.    Complete by:  As directed   Place gray foam block, curve side up under heel at all times except when in CPM or when walking.  DO NOT modify, tear, cut, or change in any way the gray foam block.     Increase activity slowly as tolerated    Complete by:  As directed      TED hose    Complete by:  As directed   Use stockings (TED hose) for 2 weeks on both leg(s).  You may remove them at night for sleeping.           Follow-up Information   Follow up with Lorn Junes, MD On 01/22/2014. (appt time 3:15)    Specialty:  Orthopedic Surgery   Contact information:   [address] [place] [address] [phone]        Signed: Linda Hedges 01/11/2014, 8:07 AM

## 2014-01-10 NOTE — Clinical Social Work Psychosocial (Signed)
Clinical Social Work Department BRIEF PSYCHOSOCIAL ASSESSMENT 01/10/2014  Patient:  [name], [name]     Account Number:  [id number]     Admit date:  01/08/2014  Clinical Social Worker:  Wylene Men  Date/Time:  01/10/2014 10:39 AM  Referred by:  Physician  Date Referred:  01/10/2014 Referred for  SNF Placement  Psychosocial assessment   Other Referral:   none   Interview type:  Patient Other interview type:   none    PSYCHOSOCIAL DATA Living Status:  WIFE Admitted from facility:   Level of care:   Primary support name:  [name] Primary support relationship to patient:  SPOUSE Degree of support available:   adequate    CURRENT CONCERNS Current Concerns  Post-Acute Placement   Other Concerns:    SOCIAL WORK ASSESSMENT / PLAN PT recommends SNF/STR upon discharge (DC).  CSW assessed pt at bedisde for possible SNF placement.  Pt was alert and oriented sitting in the bedside chair.  Pt states he is from home with wife prior to surgery (total knee replacement).  Pt states that he is hopeful regarding his prognosis and less pain once recovered from this surgery. Pt states he receives support from his wife, but she is unable to care for him at home after discharge.  Pt requests to be transported via PTAR to SNF.  Pt is [place].  CSW informed representative of bed choice. CSW is currently awaiting SNF authorization from [facility].  FL2 has been signed and DC summary has been sent to SNF.   Assessment/plan status:  Psychosocial Support/Ongoing Assessment of Needs Other assessment/ plan:   FL2  PASARR   Information/referral to community resources:   SNF  STR    PATIENT'S/FAMILY'S RESPONSE TO PLAN OF CARE: Pt was agreeable to SNF placement and appreciative of CSW assistance and support.       Nonnie Done, [place] [phone]  Psychiatric & Orthopedics (5N 1-16) Clinical Social Worker

## 2014-01-10 NOTE — Progress Notes (Signed)
Orthopedic Tech Progress Note Patient Details:  TRAVUS OREN 09-11-1940 [id number] On cpm at 6:25 pm 0-60 Patient ID: CHADLEY DZIEDZIC, male   DOB: October 01, 1940, 73 y.o.   MRN: [id number]   Braulio Bosch 01/10/2014, 6:23 PM

## 2014-01-10 NOTE — Clinical Social Work Placement (Addendum)
Clinical Social Work Department CLINICAL SOCIAL WORK PLACEMENT NOTE 01/10/2014  Patient:  [name], [name]  Account Number:  [id number] Admit date:  01/08/2014  Clinical Social Worker:  Wylene Men  Date/time:  01/10/2014 10:44 AM  Clinical Social Work is seeking post-discharge placement for this patient at the following level of care:   [place]   (*CSW will update this form in Epic as items are completed)   01/09/2014  Patient/family provided with [facility] Department of Clinical Social Work's list of facilities offering this level of care within the geographic area requested by the patient (or if unable, by the patient's family).  01/09/2014  Patient/family informed of their freedom to choose among providers that offer the needed level of care, that participate in Medicare, Medicaid or managed care program needed by the patient, have an available bed and are willing to accept the patient.  01/09/2014  Patient/family informed of [facility]' ownership interest in [facility], as well as of the fact that they are under no obligation to receive care at this facility.  PASARR submitted to [employer] on 01/10/2014 PASARR number received on 01/10/2014  FL2 transmitted to all facilities in geographic area requested by pt/family on  01/09/2014 FL2 transmitted to all facilities within larger geographic area on   Patient informed that his/her managed care company has contracts with or will negotiate with  certain facilities, including the following:     Patient/family informed of bed offers received:  01/10/2014 Patient chooses bed at [facility]- however insurance denied SNF placement pt to go home with [facility] Physician recommends and patient chooses bed at    Patient to be transferred to home via wife- [facility] denial due to ambulating 350 ft min assist Patient to be transferred to facility by wife Patient and family notified of transfer on 01/10/2014 Name of family  member notified:  Pt to update wife. No request for CSW to contact family  The following physician request were entered in Epic:   Additional Comments: Pt reports he has already informed wife of discharge plans today and will update her.  Nonnie Done, [place] [phone]  Psychiatric & Orthopedics (5N 1-16) Clinical Social Worker

## 2014-01-10 NOTE — Progress Notes (Signed)
Physical Therapy Treatment Patient Details Name: [name] MRN: [id number] DOB: 09-02-1940 Today's Date: 01/10/2014    History of Present Illness Pt s/p R TKA    PT Comments    Pt has been seen for his visit prior to planned discharge to SNF.  Demonstrates some restrictions in ROM and strength that create additional fall risk, needed supervision on stairs which are at entrance of his home.  Current plan is appropriate.  Follow Up Recommendations  SNF     Equipment Recommendations  Rolling walker with 5" wheels    Recommendations for Other Services       Precautions / Restrictions Precautions Precautions: Knee Precaution Booklet Issued: Yes (comment) Restrictions Weight Bearing Restrictions: Yes RLE Weight Bearing: Weight bearing as tolerated    Mobility  Bed Mobility                  Transfers Overall transfer level: Modified independent Equipment used: Rolling walker (2 wheeled) Transfers: Sit to/from [facility] Sit to Stand: Min guard Stand pivot transfers: Min guard       General transfer comment: Reminders for safety  Ambulation/Gait Ambulation/Gait assistance: Supervision Ambulation Distance (Feet): 300 Feet Assistive device: Rolling walker (2 wheeled) Gait Pattern/deviations: Step-through pattern;Decreased stride length;Decreased dorsiflexion - right;Decreased dorsiflexion - left;Drifts right/left;Wide base of support Gait velocity: decreased Gait velocity interpretation: Below normal speed for age/gender General Gait Details: Pt drifts to  R and  reminded him to be careful with obstacles for avoiding LOB.  Reminders for uproght posture and step length   Stairs Stairs: Yes Stairs assistance: Supervision Stair Management: Two rails;Step to pattern Number of Stairs: 10 General stair comments: Cued sequence and pt followed well.  Wheelchair Mobility    Modified Rankin (Stroke Patients Only)       Balance Overall  balance assessment: Needs assistance Sitting-balance support: Feet supported Sitting balance-Leahy Scale: Good   Postural control: Posterior lean Standing balance support: Bilateral upper extremity supported Standing balance-Leahy Scale: Fair                      Cognition Arousal/Alertness: Awake/alert Behavior During Therapy: WFL for tasks assessed/performed Overall Cognitive Status: Within Functional Limits for tasks assessed                      Exercises Total Joint Exercises Ankle Circles/Pumps: AROM;5 reps;Both Long Arc Quad: Strengthening;Both;10 reps Knee Flexion: Strengthening;Both;10 reps    General Comments General comments (skin integrity, edema, etc.): Drain is removed and clean bandage in place on R knee      Pertinent Vitals/Pain Pain Assessment: 0-10 Pain Score: 2  Pain Location: R knee Pain Descriptors / Indicators: Aching Pain Intervention(s): Limited activity within patient's tolerance;Repositioned;Premedicated before session    Home Living                      Prior Function            PT Goals (current goals can now be found in the care plan section) Acute Rehab PT Goals Patient Stated Goal: Get home stronger since wife is unable to help Progress towards PT goals: Progressing toward goals    Frequency  7X/week    PT Plan Current plan remains appropriate    Co-evaluation             End of Session   Activity Tolerance: Patient tolerated treatment well Patient left: in chair;with call bell/phone within reach  Time: 5374-8270 PT Time Calculation (min): 29 min  Charges:  $Gait Training: 8-22 mins $Therapeutic Exercise: 8-22 mins                    G Codes:      Ramond Dial 02/02/2014, 11:01 AM  Mee Hives, PT MS Acute Rehab Dept. Number: [phone]

## 2014-01-11 LAB — BASIC METABOLIC PANEL
Anion gap: 16 — ABNORMAL HIGH (ref 5–15)
BUN: 28 mg/dL — ABNORMAL HIGH (ref 6–23)
CO2: 21 mEq/L (ref 19–32)
Calcium: 8.7 mg/dL (ref 8.4–10.5)
Chloride: 100 mEq/L (ref 96–112)
Creatinine, Ser: 1.27 mg/dL (ref 0.50–1.35)
GFR calc Af Amer: 63 mL/min — ABNORMAL LOW (ref >90)
GFR calc non Af Amer: 55 mL/min — ABNORMAL LOW (ref >90)
Glucose, Bld: 182 mg/dL — ABNORMAL HIGH (ref 70–99)
Potassium: 3.7 mEq/L (ref 3.7–5.3)
Sodium: 137 mEq/L (ref 137–147)

## 2014-01-11 LAB — GLUCOSE, CAPILLARY
Glucose-Capillary: 253 mg/dL — ABNORMAL HIGH (ref 70–99)
Glucose-Capillary: 164 mg/dL — ABNORMAL HIGH (ref 70–99)

## 2014-01-11 LAB — CBC
HCT: 36.4 % — ABNORMAL LOW (ref 39.0–52.0)
Hemoglobin: 12.9 g/dL — ABNORMAL LOW (ref 13.0–17.0)
MCH: 31.5 pg (ref 26.0–34.0)
MCHC: 35.4 g/dL (ref 30.0–36.0)
MCV: 88.8 fL (ref 78.0–100.0)
Platelets: 239 10*3/uL (ref 150–400)
RBC: 4.1 MIL/uL — ABNORMAL LOW (ref 4.22–5.81)
RDW: 13.7 % (ref 11.5–15.5)
WBC: 7.7 10*3/uL (ref 4.0–10.5)

## 2014-01-11 MED ORDER — ENOXAPARIN SODIUM 30 MG/0.3ML ~~LOC~~ SOLN: 30.0000 mg | Freq: Two times a day (BID) | SUBCUTANEOUS | Status: DC

## 2014-01-11 NOTE — Care Management Note (Signed)
CARE MANAGEMENT NOTE 01/11/2014  Patient:  ZAMARION, LONGEST   Account Number:  [id number]  Date Initiated:  01/09/2014  Documentation initiated by:  Ricki Miller  Subjective/Objective Assessment:   73 yr old male admitted with right knee DJD. Patient underwent right total knee arthroplasty.       Anticipated DC Date:  01/10/2014   Anticipated DC Plan:  [place]  In-house referral  Clinical Social Worker      DC Forensic scientist  CM consult      [facility] Choice  HOME HEALTH  DURABLE MEDICAL EQUIPMENT   Choice offered to / List presented to:  C-1 Patient   DME arranged  [place]      DME agency  [employer]     [place] arranged  HH-2 PT      [place].   Status of service:  Completed, signed off Discharge Disposition:  [place]  Per UR Regulation:  Reviewed for med. necessity/level of care/duration of stay  IComments:  01/11/14 0900 Ricki Miller, RN BSN Case Manager Patient has progress with physical therapy, and therapist state patient will do well at home. Patient understands [facility] has not authorized shortterm SNF. CM discussed Home Health with patient. Referral was called to Amy, [place] Liaison.

## 2014-01-11 NOTE — Care Management Note (Deleted)
CARE MANAGEMENT NOTE 01/11/2014  Patient:  ROMOLO, SIELING   Account Number:  [id number]  Date Initiated:  01/09/2014  Documentation initiated by:  Ricki Miller  Subjective/Objective Assessment:   73 yr old male admitted with right knee DJD. Patient underwent right total knee arthroplasty.     Action/Plan:   Case manager spoke with patient concerning discharge plans. Patient states he will go for shortterm rehab at [facility]. Social worker notified. PT has assessed. patient.   Anticipated DC Date:  01/10/2014   Anticipated DC Plan:  [place]  In-house referral  Clinical Social Worker      DC Planning Services  CM consult      [facility] Choice  HOME HEALTH  DURABLE MEDICAL EQUIPMENT   Choice offered to / List presented to:  C-1 Patient   DME arranged  [place]      DME agency  [employer]     [place] arranged  HH-2 PT      [place].   Status of service:  Completed, signed off Medicare Important Message given?   (If response is "NO", the following Medicare IM given date fields will be blank) Date Medicare IM given:   Medicare IM given by:   Date Additional Medicare IM given:   Additional Medicare IM given by:    Discharge Disposition:  [place]  Per UR Regulation:  Reviewed for med. necessity/level of care/duration of stay

## 2014-01-11 NOTE — Clinical Social Work Note (Signed)
CSW received notification from [facility] representative: received denial for SNF request due to pt ambulating 350 ft min assist.  Pt is agreeable to going home with possible home health PT.  RNCM and RN aware.  CSW signing off at this time.  Nonnie Done, [place] [phone]  Psychiatric & Orthopedics (5N 1-16) Clinical Social Worker

## 2014-01-11 NOTE — Evaluation (Signed)
Physical Therapy Evaluation Patient Details Name: ABDIRAHMAN CHITTUM MRN: [id number] DOB: [birth year] Today's Date: 01/11/2014   History of Present Illness  Pt s/p R TKA  Clinical Impression  Pt is going home per his report, but has achieved a good level of mobility today.  Gait steps are lengthening and postural control is increasing.  Has overall better appearance of knee and should be capable of moving himself.  CPM and walker are being delivered to hospital, and pt reports he will leave the CPM on his bed to prevent having to lift it (wife cannot do this).      Follow Up Recommendations Home health PT;Supervision/Assistance - 24 hour    Equipment Recommendations  Rolling walker with 5" wheels    Recommendations for Other Services       Precautions / Restrictions Precautions Precautions: Knee Precaution Booklet Issued: Yes (comment) Restrictions Weight Bearing Restrictions: Yes RLE Weight Bearing: Weight bearing as tolerated      Mobility  Bed Mobility Overal bed mobility: Modified Independent Bed Mobility: Supine to Sit     Supine to sit: Modified independent (Device/Increase time)     General bed mobility comments: HOB up and used railing  Transfers Overall transfer level: Modified independent Equipment used: Rolling walker (2 wheeled) Transfers: Sit to/from [facility] Sit to Stand: Supervision Stand pivot transfers: Supervision       General transfer comment: Reminders for safety  Ambulation/Gait Ambulation/Gait assistance: Supervision Ambulation Distance (Feet): 350 Feet Assistive device: Rolling walker (2 wheeled) Gait Pattern/deviations: Step-through pattern;Decreased step length - right;Decreased stride length;Decreased dorsiflexion - right;Decreased dorsiflexion - left;Decreased weight shift to right;Wide base of support Gait velocity: decreased Gait velocity interpretation: Below normal speed for age/gender General Gait Details: Less  R side drift with RW but did misjudge the distance to an obstacle slightly  Stairs            Wheelchair Mobility    Modified Rankin (Stroke Patients Only)       Balance Overall balance assessment: Modified Independent Sitting-balance support: Feet unsupported Sitting balance-Leahy Scale: Good   Postural control: Other (comment) (minor forward lean) Standing balance support: Bilateral upper extremity supported Standing balance-Leahy Scale: Fair                               Pertinent Vitals/Pain Pain Assessment: No/denies pain Pain Score: 0-No pain    Home Living                        Prior Function                 Hand Dominance        Extremity/Trunk Assessment                         Communication      Cognition Arousal/Alertness: Awake/alert Behavior During Therapy: WFL for tasks assessed/performed Overall Cognitive Status: Within Functional Limits for tasks assessed                      General Comments General comments (skin integrity, edema, etc.): Has minimal RLE edema with good appearance of bandage on R knee    Exercises Total Joint Exercises Ankle Circles/Pumps: AROM;Both;5 reps Quad Sets: Both;10 reps;Strengthening Heel Slides: AROM;Both;10 reps Hip ABduction/ADduction: AROM;Both;10 reps Straight Leg Raises: AROM;Both;10 reps      Assessment/Plan  PT Assessment    PT Diagnosis     PT Problem List    PT Treatment Interventions     PT Goals (Current goals can be found in the Care Plan section) Acute Rehab PT Goals Patient Stated Goal: go home today    Frequency 7X/week   Barriers to discharge        Co-evaluation               End of Session   Activity Tolerance: Patient tolerated treatment well Patient left: in chair;with call bell/phone within reach Nurse Communication: Mobility status         Time: 3794-3276 PT Time Calculation (min): 24 min   Charges:      PT Treatments $Gait Training: 8-22 mins $Therapeutic Exercise: 8-22 mins   PT G Codes:          Ramond Dial 02-06-14, 11:05 AM Mee Hives, PT MS Acute Rehab Dept. Number: [phone]

## 2014-01-11 NOTE — Progress Notes (Signed)
Patient discharged to home accompanied by family. Discharge instructions, follow-up appointment, and rx given and explained and patient stated understanding. IV was removed and patient left unit in a stable condition with all personal belongings via wheelchair.

## 2014-02-06 ENCOUNTER — Encounter: Payer: Self-pay | Admitting: Gastroenterology

## 2014-08-13 ENCOUNTER — Ambulatory Visit
Admission: RE | Admit: 2014-08-13 | Discharge: 2014-08-13 | Disposition: A | Discharge status: Home / Self Care | Payer: PPO | Source: Ambulatory Visit | Attending: Family Medicine | Admitting: Family Medicine

## 2014-08-13 ENCOUNTER — Other Ambulatory Visit: Payer: Self-pay | Admitting: Family Medicine

## 2014-08-13 DIAGNOSIS — S20211A Contusion of right front wall of thorax, initial encounter: Secondary | ICD-10-CM

## 2014-08-31 ENCOUNTER — Other Ambulatory Visit: Payer: Self-pay | Admitting: Family Medicine

## 2014-08-31 DIAGNOSIS — R1084 Generalized abdominal pain: Secondary | ICD-10-CM

## 2014-09-07 ENCOUNTER — Ambulatory Visit
Admission: RE | Admit: 2014-09-07 | Discharge: 2014-09-07 | Disposition: A | Discharge status: Home / Self Care | Payer: PPO | Source: Ambulatory Visit | Attending: Family Medicine | Admitting: Family Medicine

## 2014-09-07 DIAGNOSIS — R1084 Generalized abdominal pain: Secondary | ICD-10-CM

## 2014-10-15 NOTE — H&P (Signed)
TOTAL HIP ADMISSION H&P  Patient is admitted for right total hip arthroplasty.  Subjective:  Chief Complaint: right hip pain  HPI: [name], 74 y.o. male, has a history of pain and functional disability in the right hip(s) due to arthritis and patient has failed non-surgical conservative treatments for greater than 12 weeks to include NSAID's and/or analgesics, corticosteriod injections, supervised PT with diminished ADL's post treatment and activity modification.  Onset of symptoms was gradual starting 3 years ago with gradually worsening course since that time.The patient noted no past surgery on the right hip(s).  Patient currently rates pain in the right hip at 8 out of 10 with activity. Patient has night pain, worsening of pain with activity and weight bearing, pain that interfers with activities of daily living and crepitus. Patient has evidence of joint space narrowing by imaging studies. This condition presents safety issues increasing the risk of falls.  There is no current active infection.  Patient Active Problem List   Diagnosis Date Noted  . DJD (degenerative joint disease) of knee 01/08/2014  . Primary osteoarthritis of right knee   . Primary localized osteoarthritis of right hip   . Spinal stenosis of lumbar region with radiculopathy   . Diabetes mellitus type 2 in obese   . PONV (postoperative nausea and vomiting)   . GERD (gastroesophageal reflux disease)   . Frequent urination at night   . Lumbar stenosis 04/11/2013  . Kidney stones   . Arthritis   . Neuromuscular disorder   . Right knee DJD   . Chronic calculus cholecystitis 12/31/2011   Past Medical History  Diagnosis Date  . Kidney stones   . Hypertension   . Diabetes mellitus type 2 in obese   . PONV (postoperative nausea and vomiting)   . GERD (gastroesophageal reflux disease)     occ indigestion  . Arthritis     arthirtis all over  . Neuromuscular disorder     restless leg syndrome  . Right knee  DJD   . Hyperlipemia   . Frequent urination at night   . Primary osteoarthritis of right knee   . Primary localized osteoarthritis of right hip   . Spinal stenosis of lumbar region with radiculopathy   . PONV (postoperative nausea and vomiting)   . Glaucoma     both eyes    Past Surgical History  Procedure Laterality Date  . Lithotripsy    . Shoulder surgery      right  . Elbow surgery      right  . Abdominal hernia repair    . Total knee arthroplasty Left 07/2006    left knee  . Hernia repair  1980    inguinal  . Shoulder arthroscopy  1980    right  . Elbow arthroscopy  1990    right  . Stone extraction with basket  2010  . Joint replacement  2008    knee  . Lumbar disc surgery      2015  . Total knee arthroplasty Right 01/08/2014    Procedure: RIGHT TOTAL KNEE ARTHROPLASTY;  Surgeon: Lorn Junes, MD;  Location: [place];  Service: Orthopedics;  Laterality: Right;    No prescriptions prior to admission   Allergies  Allergen Reactions  . Morphine And Related     Causes nausea & vomiting    History  Substance Use Topics  . Smoking status: Never Smoker   . Smokeless tobacco: Not on file  . Alcohol Use: No  Comment: glass of wine every 3-4 months    Family History  Problem Relation Age of Onset  . Hypertension Mother   . Hypertension Father   . Hypertension Brother   . Kidney disease Mother   . Diabetes Mellitus II Father   . Diabetes Mellitus II Brother   . Diabetes Mellitus II Brother   . Cancer - Prostate Brother      Review of Systems  Constitutional: Negative for fever and chills.  HENT: Negative for congestion and sore throat.   Eyes: Negative for double vision.  Respiratory: Negative for shortness of breath and wheezing.   Cardiovascular: Negative for chest pain and palpitations.  Gastrointestinal: Negative for nausea, vomiting and abdominal pain.  Musculoskeletal:       Pain with ambulation of the right hip  Neurological: Negative for  dizziness and loss of consciousness.  Psychiatric/Behavioral: Negative for depression and suicidal ideas.    Objective:  Physical Exam  Constitutional: He is oriented to person, place, and time. He appears well-developed and well-nourished.  HENT:  Head: Normocephalic and atraumatic.  Eyes: Conjunctivae and EOM are normal. Pupils are equal, round, and reactive to light.  Neck: Normal range of motion. Neck supple.  Cardiovascular: Normal rate and intact distal pulses.   Respiratory: Effort normal and breath sounds normal.  GI: Soft. Bowel sounds are normal.  Musculoskeletal:  Decreased ROM by about 50% due to pain  Neurological: He is alert and oriented to person, place, and time.  Skin: Skin is warm and dry.  Psychiatric: He has a normal mood and affect. His behavior is normal. Judgment and thought content normal.    Vital signs in last 24 hours:    Labs:   Estimated body mass index is 36.70 kg/(m^2) as calculated from the following:   Height as of 01/08/14: 5\' 11"  (1.803 m).   Weight as of 12/29/13: 119.296 kg (263 lb).   Imaging Review Plain radiographs demonstrate severe degenerative joint disease of the right hip(s). The bone quality appears to be fair for age and reported activity level.  Assessment/Plan:  End stage arthritis, right hip(s)  The patient history, physical examination, clinical judgement of the provider and imaging studies are consistent with end stage degenerative joint disease of the right hip(s) and total hip arthroplasty is deemed medically necessary. The treatment options including medical management, injection therapy, arthroscopy and arthroplasty were discussed at length. The risks and benefits of total hip arthroplasty were presented and reviewed. The risks due to aseptic loosening, infection, stiffness, dislocation/subluxation,  thromboembolic complications and other imponderables were discussed.  The patient acknowledged the explanation, agreed to  proceed with the plan and consent was signed. Patient is being admitted for inpatient treatment for surgery, pain control, PT, OT, prophylactic antibiotics, VTE prophylaxis, progressive ambulation and ADL's and discharge planning.The patient is planning to be discharged home with home health services

## 2014-10-25 ENCOUNTER — Encounter (HOSPITAL_COMMUNITY)
Admission: RE | Admit: 2014-10-25 | Discharge: 2014-10-25 | Disposition: A | Discharge status: Home / Self Care | Payer: PPO | Source: Ambulatory Visit | Attending: Orthopedic Surgery | Admitting: Orthopedic Surgery

## 2014-10-25 ENCOUNTER — Encounter (HOSPITAL_COMMUNITY): Payer: Self-pay

## 2014-10-25 ENCOUNTER — Other Ambulatory Visit: Payer: Self-pay

## 2014-10-25 DIAGNOSIS — Z0183 Encounter for blood typing: Secondary | ICD-10-CM | POA: Insufficient documentation

## 2014-10-25 DIAGNOSIS — Z01812 Encounter for preprocedural laboratory examination: Secondary | ICD-10-CM | POA: Diagnosis not present

## 2014-10-25 DIAGNOSIS — Z01810 Encounter for preprocedural cardiovascular examination: Secondary | ICD-10-CM | POA: Insufficient documentation

## 2014-10-25 LAB — GLUCOSE, CAPILLARY: Glucose-Capillary: 205 mg/dL — ABNORMAL HIGH (ref 65–99)

## 2014-10-25 LAB — BASIC METABOLIC PANEL
Anion gap: 7 (ref 5–15)
BUN: 38 mg/dL — ABNORMAL HIGH (ref 6–20)
CO2: 26 mmol/L (ref 22–32)
Calcium: 9.9 mg/dL (ref 8.9–10.3)
Chloride: 107 mmol/L (ref 101–111)
Creatinine, Ser: 1.61 mg/dL — ABNORMAL HIGH (ref 0.61–1.24)
GFR calc Af Amer: 47 mL/min — ABNORMAL LOW (ref >60)
GFR calc non Af Amer: 41 mL/min — ABNORMAL LOW (ref >60)
Glucose, Bld: 200 mg/dL — ABNORMAL HIGH (ref 65–99)
Potassium: 4 mmol/L (ref 3.5–5.1)
Sodium: 140 mmol/L (ref 135–145)

## 2014-10-25 LAB — URINALYSIS, ROUTINE W REFLEX MICROSCOPIC
Bilirubin Urine: NEGATIVE
Glucose, UA: NEGATIVE mg/dL
Hgb urine dipstick: NEGATIVE
Ketones, ur: NEGATIVE mg/dL
Leukocytes, UA: NEGATIVE
Nitrite: NEGATIVE
Protein, ur: NEGATIVE mg/dL
Specific Gravity, Urine: 1.025 (ref 1.005–1.030)
Urobilinogen, UA: 0.2 mg/dL (ref 0.0–1.0)
pH: 5 (ref 5.0–8.0)

## 2014-10-25 LAB — CBC
HCT: 44.8 % (ref 39.0–52.0)
Hemoglobin: 15.5 g/dL (ref 13.0–17.0)
MCH: 31.2 pg (ref 26.0–34.0)
MCHC: 34.6 g/dL (ref 30.0–36.0)
MCV: 90.1 fL (ref 78.0–100.0)
Platelets: 243 10*3/uL (ref 150–400)
RBC: 4.97 MIL/uL (ref 4.22–5.81)
RDW: 14.3 % (ref 11.5–15.5)
WBC: 10.4 10*3/uL (ref 4.0–10.5)

## 2014-10-25 LAB — SURGICAL PCR SCREEN
MRSA, PCR: NEGATIVE
Staphylococcus aureus: NEGATIVE

## 2014-10-25 LAB — PROTIME-INR
INR: 1.1 (ref 0.00–1.49)
Prothrombin Time: 14.4 seconds (ref 11.6–15.2)

## 2014-10-25 LAB — TYPE AND SCREEN
ABO/RH(D): A POS
Antibody Screen: NEGATIVE

## 2014-10-25 NOTE — Pre-Procedure Instructions (Addendum)
    [name]  10/25/2014      [facility] - [place] - [address] [place] [address] Phone: [phone] Fax: [phone]    Your procedure is scheduled on 11-06-2014   Tuesday     .  Report to [facility] Admitting at 10:00 A.M.    Call this number if you have problems the morning of surgery:  [phone]   Remember:  Do not eat food or drink liquids after midnight.   Take these medicines the morning of surgery with A SIP OF WATER allopurinol,amlodipine,docusate sodium,gemfibrozil(Lopid)   Stop Aspirin ,NSAIDS(ibuprofen,Aleve ect),all herbal supplements,mobic,omega 3                  No Diabetic medications the morning of Surgery     Do not wear lotions, powders, or perfumes.    Do not shave 48 hours prior to surgery.  Men may shave face and neck.   Do not bring valuables to the hospital.  [facility] is not responsible for any belongings or valuables.  Contacts, dentures or bridgework may not be worn into surgery.  Leave your suitcase in the car.  After surgery it may be brought to your room.  For patients admitted to the hospital, discharge time will be determined by your treatment team.  Patients discharged the day of surgery will not be allowed to drive home.   Special instructions: See attached sheet "Preparing for Surgery" for instructions of CHG shower  Please read over the following fact sheets that you were given. Pain Booklet, Coughing and Deep Breathing, Blood Transfusion Information and Surgical Site Infection Prevention

## 2014-10-26 LAB — URINE CULTURE: Culture: NO GROWTH

## 2014-11-05 MED ORDER — TRANEXAMIC ACID 1000 MG/10ML IV SOLN
1000.0000 mg | INTRAVENOUS | Status: AC
  Filled 2014-11-05: qty 10

## 2014-11-06 ENCOUNTER — Inpatient Hospital Stay (HOSPITAL_COMMUNITY): Payer: PPO | Admitting: Certified Registered"

## 2014-11-06 ENCOUNTER — Inpatient Hospital Stay (HOSPITAL_COMMUNITY): Payer: PPO

## 2014-11-06 ENCOUNTER — Inpatient Hospital Stay (HOSPITAL_COMMUNITY)
Admission: RE | Admit: 2014-11-06 | Discharge: 2014-11-07 | DRG: 470 | Disposition: A | Discharge status: Home Health | Payer: PPO | Source: Ambulatory Visit | Attending: Orthopedic Surgery | Admitting: Orthopedic Surgery

## 2014-11-06 ENCOUNTER — Encounter (HOSPITAL_COMMUNITY): Payer: Self-pay | Admitting: *Deleted

## 2014-11-06 ENCOUNTER — Encounter (HOSPITAL_COMMUNITY)
Admission: RE | Disposition: A | Discharge status: Home Health | Payer: Self-pay | Source: Ambulatory Visit | Attending: Orthopedic Surgery

## 2014-11-06 DIAGNOSIS — Z96653 Presence of artificial knee joint, bilateral: Secondary | ICD-10-CM | POA: Diagnosis present

## 2014-11-06 DIAGNOSIS — M1611 Unilateral primary osteoarthritis, right hip: Secondary | ICD-10-CM | POA: Diagnosis not present

## 2014-11-06 DIAGNOSIS — E669 Obesity, unspecified: Secondary | ICD-10-CM | POA: Diagnosis not present

## 2014-11-06 DIAGNOSIS — E119 Type 2 diabetes mellitus without complications: Secondary | ICD-10-CM | POA: Diagnosis present

## 2014-11-06 DIAGNOSIS — E785 Hyperlipidemia, unspecified: Secondary | ICD-10-CM | POA: Diagnosis present

## 2014-11-06 DIAGNOSIS — M1990 Unspecified osteoarthritis, unspecified site: Secondary | ICD-10-CM | POA: Diagnosis present

## 2014-11-06 DIAGNOSIS — M5410 Radiculopathy, site unspecified: Secondary | ICD-10-CM | POA: Diagnosis present

## 2014-11-06 DIAGNOSIS — N289 Disorder of kidney and ureter, unspecified: Secondary | ICD-10-CM | POA: Diagnosis present

## 2014-11-06 DIAGNOSIS — I10 Essential (primary) hypertension: Secondary | ICD-10-CM | POA: Diagnosis present

## 2014-11-06 DIAGNOSIS — M4806 Spinal stenosis, lumbar region: Secondary | ICD-10-CM | POA: Diagnosis present

## 2014-11-06 DIAGNOSIS — Z6836 Body mass index (BMI) 36.0-36.9, adult: Secondary | ICD-10-CM | POA: Diagnosis not present

## 2014-11-06 DIAGNOSIS — Z96649 Presence of unspecified artificial hip joint: Secondary | ICD-10-CM

## 2014-11-06 DIAGNOSIS — M25551 Pain in right hip: Secondary | ICD-10-CM | POA: Diagnosis present

## 2014-11-06 HISTORY — PX: TOTAL HIP ARTHROPLASTY: SHX124

## 2014-11-06 LAB — GLUCOSE, CAPILLARY
Glucose-Capillary: 300 mg/dL — ABNORMAL HIGH (ref 65–99)
Glucose-Capillary: 266 mg/dL — ABNORMAL HIGH (ref 65–99)
Glucose-Capillary: 208 mg/dL — ABNORMAL HIGH (ref 65–99)

## 2014-11-06 SURGERY — ARTHROPLASTY, HIP, TOTAL, ANTERIOR APPROACH
Anesthesia: Spinal | Site: Hip | Laterality: Right

## 2014-11-06 MED ORDER — PROPOFOL 10 MG/ML IV BOLUS
INTRAVENOUS | Status: AC
  Filled 2014-11-06: qty 20

## 2014-11-06 MED ORDER — ONDANSETRON HCL 4 MG PO TABS: 4.0000 mg | ORAL_TABLET | Freq: Three times a day (TID) | ORAL | Status: DC | PRN

## 2014-11-06 MED ORDER — ACETAMINOPHEN 650 MG RE SUPP: 650.0000 mg | Freq: Four times a day (QID) | RECTAL | Status: DC | PRN

## 2014-11-06 MED ORDER — DEXAMETHASONE SODIUM PHOSPHATE 10 MG/ML IJ SOLN
10.0000 mg | Freq: Once | INTRAMUSCULAR | Status: AC
Start: 2014-11-07 — End: 2014-11-07
  Administered 2014-11-07: 10 mg via INTRAVENOUS
  Filled 2014-11-06: qty 1

## 2014-11-06 MED ORDER — DEXTROSE 5 % IV SOLN
500.0000 mg | Freq: Four times a day (QID) | INTRAVENOUS | Status: DC | PRN
  Filled 2014-11-06: qty 5

## 2014-11-06 MED ORDER — CEFAZOLIN SODIUM-DEXTROSE 2-3 GM-% IV SOLR
2.0000 g | Freq: Four times a day (QID) | INTRAVENOUS | Status: AC
  Administered 2014-11-06: 2 g via INTRAVENOUS
  Filled 2014-11-06 (×2): qty 50

## 2014-11-06 MED ORDER — HYDROMORPHONE HCL 1 MG/ML IJ SOLN: 0.2500 mg | INTRAMUSCULAR | Status: DC | PRN

## 2014-11-06 MED ORDER — MELOXICAM 15 MG PO TABS
15.0000 mg | ORAL_TABLET | Freq: Every day | ORAL | Status: DC
Start: 2014-11-06 — End: 2014-11-07
  Administered 2014-11-07: 15 mg via ORAL
  Filled 2014-11-06 (×2): qty 1

## 2014-11-06 MED ORDER — TAMSULOSIN HCL 0.4 MG PO CAPS: 0.4000 mg | ORAL_CAPSULE | Freq: Every day | ORAL | Status: DC

## 2014-11-06 MED ORDER — FENTANYL CITRATE (PF) 100 MCG/2ML IJ SOLN
INTRAMUSCULAR | Status: DC | PRN
  Administered 2014-11-06 (×2): 50 ug via INTRAVENOUS

## 2014-11-06 MED ORDER — ASPIRIN EC 325 MG PO TBEC
325.0000 mg | DELAYED_RELEASE_TABLET | Freq: Every day | ORAL | Status: DC
  Administered 2014-11-07: 325 mg via ORAL
  Filled 2014-11-06: qty 1

## 2014-11-06 MED ORDER — PHENOL 1.4 % MT LIQD: 1.0000 | OROMUCOSAL | Status: DC | PRN

## 2014-11-06 MED ORDER — 0.9 % SODIUM CHLORIDE (POUR BTL) OPTIME
TOPICAL | Status: DC | PRN
  Administered 2014-11-06: 1000 mL

## 2014-11-06 MED ORDER — SODIUM CHLORIDE 0.9 % IV SOLN
INTRAVENOUS | Status: DC
  Administered 2014-11-06: 11:00:00 via INTRAVENOUS

## 2014-11-06 MED ORDER — METHOCARBAMOL 500 MG PO TABS: 500.0000 mg | ORAL_TABLET | Freq: Four times a day (QID) | ORAL | Status: DC

## 2014-11-06 MED ORDER — ASPIRIN 325 MG PO TABS: 325.0000 mg | ORAL_TABLET | Freq: Every day | ORAL | Status: DC

## 2014-11-06 MED ORDER — HYDROMORPHONE HCL 1 MG/ML IJ SOLN
INTRAMUSCULAR | Status: AC
  Administered 2014-11-06: 0.5 mg via INTRAVENOUS
  Filled 2014-11-06: qty 1

## 2014-11-06 MED ORDER — ONDANSETRON HCL 4 MG/2ML IJ SOLN
4.0000 mg | Freq: Four times a day (QID) | INTRAMUSCULAR | Status: DC | PRN
  Administered 2014-11-06: 4 mg via INTRAVENOUS

## 2014-11-06 MED ORDER — OMEGA-3-ACID ETHYL ESTERS 1 G PO CAPS
1.0000 g | ORAL_CAPSULE | Freq: Every day | ORAL | Status: DC
Start: 2014-11-07 — End: 2014-11-07
  Administered 2014-11-07: 1 g via ORAL
  Filled 2014-11-06: qty 1

## 2014-11-06 MED ORDER — ONDANSETRON HCL 4 MG PO TABS: 4.0000 mg | ORAL_TABLET | Freq: Four times a day (QID) | ORAL | Status: DC | PRN

## 2014-11-06 MED ORDER — MIDAZOLAM HCL 2 MG/2ML IJ SOLN
INTRAMUSCULAR | Status: AC
  Filled 2014-11-06: qty 4

## 2014-11-06 MED ORDER — SODIUM CHLORIDE 0.9 % IV SOLN
1000.0000 mg | INTRAVENOUS | Status: AC
Start: 2014-11-06 — End: 2014-11-06
  Administered 2014-11-06 (×2): 1000 mg via INTRAVENOUS
  Filled 2014-11-06: qty 10

## 2014-11-06 MED ORDER — METOCLOPRAMIDE HCL 5 MG/ML IJ SOLN: 5.0000 mg | Freq: Three times a day (TID) | INTRAMUSCULAR | Status: DC | PRN

## 2014-11-06 MED ORDER — POLYETHYLENE GLYCOL 3350 17 G PO PACK: 17.0000 g | PACK | Freq: Every day | ORAL | Status: DC | PRN

## 2014-11-06 MED ORDER — HYDROCODONE-ACETAMINOPHEN 5-325 MG PO TABS
1.0000 | ORAL_TABLET | ORAL | Status: DC | PRN
  Administered 2014-11-06: 2 via ORAL
  Administered 2014-11-07: 1 via ORAL
  Administered 2014-11-07: 2 via ORAL
  Filled 2014-11-06: qty 2
  Filled 2014-11-06: qty 1
  Filled 2014-11-06: qty 2

## 2014-11-06 MED ORDER — ACETAMINOPHEN 500 MG PO TABS
1000.0000 mg | ORAL_TABLET | Freq: Once | ORAL | Status: AC
  Administered 2014-11-06: 1000 mg via ORAL
  Filled 2014-11-06: qty 2

## 2014-11-06 MED ORDER — ONDANSETRON HCL 4 MG/2ML IJ SOLN
INTRAMUSCULAR | Status: DC | PRN
  Administered 2014-11-06: 4 mg via INTRAVENOUS

## 2014-11-06 MED ORDER — HYDROMORPHONE HCL 1 MG/ML IJ SOLN
0.2500 mg | INTRAMUSCULAR | Status: DC | PRN
  Administered 2014-11-06: 0.5 mg via INTRAVENOUS

## 2014-11-06 MED ORDER — ONDANSETRON HCL 4 MG/2ML IJ SOLN: 4.0000 mg | Freq: Once | INTRAMUSCULAR | Status: DC | PRN

## 2014-11-06 MED ORDER — HYDROMORPHONE HCL 1 MG/ML IJ SOLN: 1.0000 mg | INTRAMUSCULAR | Status: DC | PRN

## 2014-11-06 MED ORDER — ONDANSETRON HCL 4 MG/2ML IJ SOLN
INTRAMUSCULAR | Status: AC
  Filled 2014-11-06: qty 2

## 2014-11-06 MED ORDER — GEMFIBROZIL 600 MG PO TABS
600.0000 mg | ORAL_TABLET | Freq: Two times a day (BID) | ORAL | Status: DC
  Administered 2014-11-06 – 2014-11-07 (×2): 600 mg via ORAL
  Filled 2014-11-06 (×3): qty 1

## 2014-11-06 MED ORDER — OXYCODONE HCL 5 MG/5ML PO SOLN: 5.0000 mg | Freq: Once | ORAL | Status: DC | PRN

## 2014-11-06 MED ORDER — MENTHOL 3 MG MT LOZG: 1.0000 | LOZENGE | OROMUCOSAL | Status: DC | PRN

## 2014-11-06 MED ORDER — CETYLPYRIDINIUM CHLORIDE 0.05 % MT LIQD
7.0000 mL | Freq: Two times a day (BID) | OROMUCOSAL | Status: DC
  Administered 2014-11-06 – 2014-11-07 (×2): 7 mL via OROMUCOSAL

## 2014-11-06 MED ORDER — FENTANYL CITRATE (PF) 250 MCG/5ML IJ SOLN
INTRAMUSCULAR | Status: AC
  Filled 2014-11-06: qty 5

## 2014-11-06 MED ORDER — POTASSIUM CHLORIDE IN NACL 20-0.45 MEQ/L-% IV SOLN
INTRAVENOUS | Status: DC
Start: 2014-11-06 — End: 2014-11-06

## 2014-11-06 MED ORDER — ACETAMINOPHEN 160 MG/5ML PO SOLN: 325.0000 mg | ORAL | Status: DC | PRN

## 2014-11-06 MED ORDER — ALLOPURINOL 300 MG PO TABS
150.0000 mg | ORAL_TABLET | Freq: Every day | ORAL | Status: DC
  Administered 2014-11-07: 150 mg via ORAL
  Filled 2014-11-06: qty 1

## 2014-11-06 MED ORDER — VITAMIN D3 25 MCG (1000 UNIT) PO TABS
5000.0000 [IU] | ORAL_TABLET | Freq: Every day | ORAL | Status: DC
  Administered 2014-11-07: 5000 [IU] via ORAL
  Filled 2014-11-06 (×3): qty 5

## 2014-11-06 MED ORDER — CHLORHEXIDINE GLUCONATE 4 % EX LIQD: 60.0000 mL | Freq: Once | CUTANEOUS | Status: DC

## 2014-11-06 MED ORDER — OXYCODONE HCL 5 MG PO TABS: 5.0000 mg | ORAL_TABLET | Freq: Once | ORAL | Status: DC | PRN

## 2014-11-06 MED ORDER — METOCLOPRAMIDE HCL 5 MG PO TABS
5.0000 mg | ORAL_TABLET | Freq: Three times a day (TID) | ORAL | Status: DC | PRN
  Filled 2014-11-06: qty 2

## 2014-11-06 MED ORDER — METFORMIN HCL ER 500 MG PO TB24: 1000.0000 mg | ORAL_TABLET | Freq: Every day | ORAL | Status: DC

## 2014-11-06 MED ORDER — POTASSIUM CHLORIDE IN NACL 20-0.45 MEQ/L-% IV SOLN
INTRAVENOUS | Status: DC
  Administered 2014-11-06: 21:00:00 via INTRAVENOUS
  Filled 2014-11-06 (×4): qty 1000

## 2014-11-06 MED ORDER — HYDROCODONE-ACETAMINOPHEN 5-325 MG PO TABS: 1.0000 | ORAL_TABLET | Freq: Four times a day (QID) | ORAL | Status: DC | PRN

## 2014-11-06 MED ORDER — CEFAZOLIN SODIUM-DEXTROSE 2-3 GM-% IV SOLR
2.0000 g | INTRAVENOUS | Status: AC
  Administered 2014-11-06: 2 g via INTRAVENOUS
  Filled 2014-11-06: qty 50

## 2014-11-06 MED ORDER — OMEGA 3 1000 MG PO CAPS
1000.0000 mg | ORAL_CAPSULE | Freq: Every day | ORAL | Status: DC
  Filled 2014-11-06: qty 1

## 2014-11-06 MED ORDER — GLIPIZIDE 10 MG PO TABS
10.0000 mg | ORAL_TABLET | Freq: Two times a day (BID) | ORAL | Status: DC
  Administered 2014-11-07: 10 mg via ORAL
  Filled 2014-11-06 (×2): qty 1
  Filled 2014-11-06: qty 2
  Filled 2014-11-06: qty 1
  Filled 2014-11-06: qty 2
  Filled 2014-11-06: qty 1

## 2014-11-06 MED ORDER — ROPINIROLE HCL 1 MG PO TABS: 2.0000 mg | ORAL_TABLET | Freq: Every day | ORAL | Status: DC

## 2014-11-06 MED ORDER — PROPOFOL INFUSION 10 MG/ML OPTIME
INTRAVENOUS | Status: DC | PRN
  Administered 2014-11-06: 100 ug/kg/min via INTRAVENOUS

## 2014-11-06 MED ORDER — ATORVASTATIN CALCIUM 10 MG PO TABS
10.0000 mg | ORAL_TABLET | Freq: Every day | ORAL | Status: DC
  Administered 2014-11-06: 10 mg via ORAL
  Filled 2014-11-06: qty 1

## 2014-11-06 MED ORDER — ACETAMINOPHEN 325 MG PO TABS: 650.0000 mg | ORAL_TABLET | Freq: Four times a day (QID) | ORAL | Status: DC | PRN

## 2014-11-06 MED ORDER — METHOCARBAMOL 500 MG PO TABS: 500.0000 mg | ORAL_TABLET | Freq: Four times a day (QID) | ORAL | Status: DC | PRN

## 2014-11-06 MED ORDER — AMLODIPINE BESYLATE 10 MG PO TABS
10.0000 mg | ORAL_TABLET | Freq: Every day | ORAL | Status: DC
  Administered 2014-11-07: 10 mg via ORAL
  Filled 2014-11-06: qty 1

## 2014-11-06 MED ORDER — LACTATED RINGERS IV SOLN
INTRAVENOUS | Status: DC | PRN
  Administered 2014-11-06 (×2): via INTRAVENOUS

## 2014-11-06 MED ORDER — MEPERIDINE HCL 25 MG/ML IJ SOLN: 6.2500 mg | INTRAMUSCULAR | Status: DC | PRN

## 2014-11-06 MED ORDER — MIDAZOLAM HCL 5 MG/5ML IJ SOLN
INTRAMUSCULAR | Status: DC | PRN
  Administered 2014-11-06: 2 mg via INTRAVENOUS

## 2014-11-06 MED ORDER — ACETAMINOPHEN 325 MG PO TABS: 325.0000 mg | ORAL_TABLET | ORAL | Status: DC | PRN

## 2014-11-06 SURGICAL SUPPLY — 52 items
BAG DECANTER FOR FLEXI CONT (MISCELLANEOUS) IMPLANT
BLADE SAW SGTL 18X1.27X75 (BLADE) ×2 IMPLANT
BLADE SURG ROTATE 9660 (MISCELLANEOUS) IMPLANT
CAPT HIP TOTAL 2 ×2 IMPLANT
CLSR STERI-STRIP ANTIMIC 1/2X4 (GAUZE/BANDAGES/DRESSINGS) ×2 IMPLANT
COVER SURGICAL LIGHT HANDLE (MISCELLANEOUS) ×2 IMPLANT
DRAPE C-ARM 42X72 X-RAY (DRAPES) IMPLANT
DRAPE INCISE IOBAN 66X45 STRL (DRAPES) ×2 IMPLANT
DRAPE ORTHO SPLIT 77X108 STRL (DRAPES) ×2
DRAPE SURG ORHT 6 SPLT 77X108 (DRAPES) ×2 IMPLANT
DRAPE U-SHAPE 47X51 STRL (DRAPES) ×2 IMPLANT
DRSG MEPILEX BORDER 4X12 (GAUZE/BANDAGES/DRESSINGS) ×2 IMPLANT
DRSG MEPILEX BORDER 4X8 (GAUZE/BANDAGES/DRESSINGS) IMPLANT
DURAPREP 26ML APPLICATOR (WOUND CARE) ×2 IMPLANT
ELECT BLADE 4.0 EZ CLEAN MEGAD (MISCELLANEOUS) ×2
ELECT REM PT RETURN 9FT ADLT (ELECTROSURGICAL) ×2
ELECTRODE BLDE 4.0 EZ CLN MEGD (MISCELLANEOUS) ×1 IMPLANT
ELECTRODE REM PT RTRN 9FT ADLT (ELECTROSURGICAL) ×1 IMPLANT
FACESHIELD WRAPAROUND (MASK) ×4 IMPLANT
GLOVE BIO SURGEON STRL SZ7 (GLOVE) ×2 IMPLANT
GLOVE BIO SURGEON STRL SZ7.5 (GLOVE) ×4 IMPLANT
GLOVE BIOGEL PI IND STRL 7.0 (GLOVE) ×1 IMPLANT
GLOVE BIOGEL PI IND STRL 8 (GLOVE) ×2 IMPLANT
GLOVE BIOGEL PI INDICATOR 7.0 (GLOVE) ×1
GLOVE BIOGEL PI INDICATOR 8 (GLOVE) ×2
GOWN STRL REUS W/ TWL LRG LVL3 (GOWN DISPOSABLE) ×2 IMPLANT
GOWN STRL REUS W/ TWL XL LVL3 (GOWN DISPOSABLE) ×1 IMPLANT
GOWN STRL REUS W/TWL LRG LVL3 (GOWN DISPOSABLE) ×2
GOWN STRL REUS W/TWL XL LVL3 (GOWN DISPOSABLE) ×1
KIT BASIN OR (CUSTOM PROCEDURE TRAY) ×2 IMPLANT
KIT ROOM TURNOVER OR (KITS) ×2 IMPLANT
MANIFOLD NEPTUNE II (INSTRUMENTS) ×2 IMPLANT
NDL SAFETY ECLIPSE 18X1.5 (NEEDLE) IMPLANT
NEEDLE 18GX1X1/2 (RX/OR ONLY) (NEEDLE) IMPLANT
NEEDLE HYPO 18GX1.5 SHARP (NEEDLE)
NS IRRIG 1000ML POUR BTL (IV SOLUTION) ×2 IMPLANT
PACK TOTAL JOINT (CUSTOM PROCEDURE TRAY) ×2 IMPLANT
PACK UNIVERSAL I (CUSTOM PROCEDURE TRAY) ×2 IMPLANT
PAD ARMBOARD 7.5X6 YLW CONV (MISCELLANEOUS) ×2 IMPLANT
SPONGE LAP 18X18 X RAY DECT (DISPOSABLE) IMPLANT
SUT MNCRL AB 4-0 PS2 18 (SUTURE) ×2 IMPLANT
SUT MON AB 2-0 CT1 36 (SUTURE) ×2 IMPLANT
SUT VIC AB 0 CT1 27 (SUTURE) ×1
SUT VIC AB 0 CT1 27XBRD ANBCTR (SUTURE) ×1 IMPLANT
SUT VIC AB 1 CT1 27 (SUTURE) ×1
SUT VIC AB 1 CT1 27XBRD ANBCTR (SUTURE) ×1 IMPLANT
SYR 50ML LL SCALE MARK (SYRINGE) IMPLANT
SYRINGE 20CC LL (MISCELLANEOUS) IMPLANT
TOWEL OR 17X24 6PK STRL BLUE (TOWEL DISPOSABLE) ×2 IMPLANT
TOWEL OR 17X26 10 PK STRL BLUE (TOWEL DISPOSABLE) ×2 IMPLANT
TRAY FOLEY CATH 16FRSI W/METER (SET/KITS/TRAYS/PACK) ×2 IMPLANT
WATER STERILE IRR 1000ML POUR (IV SOLUTION) IMPLANT

## 2014-11-06 NOTE — Progress Notes (Signed)
Dr. Ermalene Postin notified of CBG at 1030 no further orders given. Dr. Percell Miller also made aware. Patient reports that he ate peanut butter crackers last night at 2330

## 2014-11-06 NOTE — Op Note (Signed)
11/06/2014  3:42 PM  PATIENT:  [name]   MRN: [id number]  PRE-OPERATIVE DIAGNOSIS:  Osteoarthritis RIGHT HIP  POST-OPERATIVE DIAGNOSIS:  Osteoarthritis RIGHT HIP  PROCEDURE:  Procedure(s): RIGHT TOTAL HIP ARTHROPLASTY ANTERIOR APPROACH  PREOPERATIVE INDICATIONS:    [name] is an 74 y.o. male who has a diagnosis of <principal problem not specified> and elected for surgical management after failing conservative treatment.  The risks benefits and alternatives were discussed with the patient including but not limited to the risks of nonoperative treatment, versus surgical intervention including infection, bleeding, nerve injury, periprosthetic fracture, the need for revision surgery, dislocation, leg length discrepancy, blood clots, cardiopulmonary complications, morbidity, mortality, among others, and they were willing to proceed.     OPERATIVE REPORT     SURGEON:   Annisha Baar, Ernesta Amble, MD    ASSISTANT:  Lovett Calender, PA-C, She was present and scrubbed throughout the case, critical for completion in a timely fashion, and for retraction, instrumentation, and closure.     ANESTHESIA:  General    COMPLICATIONS:  None.     COMPONENTS:  Stryker acolade fit femur size 6 with a 36 mm -2.5 head ball and a PSL acetabular shell size 58 with a  polyethylene liner    PROCEDURE IN DETAIL:   The patient was met in the holding area and  identified.  The appropriate hip was identified and marked at the operative site.  The patient was then transported to the OR  and  placed under general anesthesia.  At that point, the patient was  placed in the supine position and  secured to the operating room table and all bony prominences padded. He received pre-operative antibiotics    The operative lower extremity was prepped from the iliac crest to the distal leg.  Sterile draping was performed.  Time out was performed prior to incision.      Skin incision was made just 2 cm lateral to the  ASIS  extending in line with the tensor fascia lata. Electrocautery was used to control all bleeders. I dissected down sharply to the fascia of the tensor fascia lata was confirmed that the muscle fibers beneath were running posteriorly. I then incised the fascia over the superficial tensor fascia lata in line with the incision. The fascia was elevated off the anterior aspect of the muscle the muscle was retracted posteriorly and protected throughout the case. I then used electrocautery to incise the tensor fascia lata fascia control and all bleeders. Immediately visible was the fat over top of the anterior neck and capsule.  I removed the anterior fat from the capsule and elevated the rectus muscle off of the anterior capsule. I then removed a large time of capsule. The retractors were then placed over the anterior acetabulum as well as around the superior and inferior neck.  I then removed a section of the femoral neck and a napkin ring fashion. Then used the power course to remove the femoral head from the acetabulum and thoroughly irrigated the acetabulum. I sized the femoral head.    I then exposed the deep acetabulum, cleared out any tissue including the ligamentum teres.   After adequate visualization, I excised the labrum, and then sequentially reamed.  I placed the trial acetabulum, which seated nicely, and then impacted the real cup into place.  Appropriate version and inclination was confirmed clinically matching their bony anatomy, and also with the use transverse acetabular ligament.  I placed a 82m screw in the posterior/superio  position with an excellent bite.    I then placed the polyethylene liner in place  I then abducted the leg and released the external rotators from the posterior femur allowing it to be easily delivered up lateral and anterior to the acetabulum for preparation of the femoral canal.    I then prepared the proximal femur using the cookie-cutter and then sequentially  reamed and broached.  A trial broach, neck, and head was utilized, and I reduced the hip and it was found to have excellent stability with functional range of motion..  I then impacted the real femoral prosthesis into place into the appropriate version, slightly anteverted to the normal anatomy, and I impacted the real head ball into place. The hip was then reduced and taken through functional range of motion and found to have excellent stability. Leg lengths were restored.  I then irrigated the hip copiously again with, and repaired the fascia with Vicryl, followed by monocryl for the subcutaneous tissue, Monocryl for the skin, Steri-Strips and sterile gauze. The wounds were injected. The patient was then awakened and returned to PACU in stable and satisfactory condition. There were no complications.  POST OPERATIVE PLAN: WBAT, DVT px: SCD's/TED and ASA post op  Edmonia Lynch, MD Orthopedic Surgeon [phone]   This note was generated using a template and dragon dictation system. In light of that, I have reviewed the note and all aspects of it are applicable to this case. Any dictation errors are due to the computerized dictation system.

## 2014-11-06 NOTE — Transfer of Care (Signed)
Immediate Anesthesia Transfer of Care Note  Patient: [name]  Procedure(s) Performed: Procedure(s): RIGHT TOTAL HIP ARTHROPLASTY ANTERIOR APPROACH (Right)  Patient Location: PACU  Anesthesia Type:MAC  Level of Consciousness: awake, alert , oriented and patient cooperative  Airway & Oxygen Therapy: Patient Spontanous Breathing and Patient connected to face mask oxygen  Post-op Assessment: Report given to RN and Post -op Vital signs reviewed and stable  Post vital signs: Reviewed and stable  Last Vitals:  Filed Vitals:   11/06/14 1031  BP: 143/74  Pulse: 81  Temp: 36.3 C  Resp: 20    Complications: No apparent anesthesia complications

## 2014-11-06 NOTE — Anesthesia Preprocedure Evaluation (Addendum)
Anesthesia Evaluation  Patient identified by MRN, date of birth, ID band Patient awake    Reviewed: Allergy & Precautions, NPO status , Patient's Chart, lab work & pertinent test results, reviewed documented beta blocker date and time   History of Anesthesia Complications (+) PONV  Airway Mallampati: II  TM Distance: >3 FB Neck ROM: Full    Dental  (+) Dental Advisory Given   Pulmonary  breath sounds clear to auscultation  Pulmonary exam normal       Cardiovascular hypertension, Pt. on medications and Pt. on home beta blockers Normal cardiovascular examRhythm:Regular     Neuro/Psych    GI/Hepatic GERD-  Controlled,  Endo/Other  diabetes, Well Controlled, Type 2, Oral Hypoglycemic Agents  Renal/GU Renal InsufficiencyRenal disease     Musculoskeletal   Abdominal (+)  Abdomen: soft. Bowel sounds: normal.  Peds  Hematology   Anesthesia Other Findings   Reproductive/Obstetrics                           Anesthesia Physical Anesthesia Plan  ASA: III  Anesthesia Plan: Spinal   Post-op Pain Management:    Induction: Intravenous  Airway Management Planned: Natural Airway  Additional Equipment:   Intra-op Plan:   Post-operative Plan:   Informed Consent: I have reviewed the patients History and Physical, chart, labs and discussed the procedure including the risks, benefits and alternatives for the proposed anesthesia with the patient or authorized representative who has indicated his/her understanding and acceptance.     Plan Discussed with: CRNA and Surgeon  Anesthesia Plan Comments:         Anesthesia Quick Evaluation

## 2014-11-06 NOTE — Anesthesia Procedure Notes (Addendum)
Spinal Patient location during procedure: OR Start time: 11/06/2014 1:50 PM End time: 11/06/2014 2:00 PM Staffing Anesthesiologist: Lillia Abed Performed by: anesthesiologist  Preanesthetic Checklist Completed: patient identified, site marked, surgical consent, pre-op evaluation, timeout performed, IV checked, risks and benefits discussed and monitors and equipment checked Spinal Block Patient position: sitting Prep: Betadine Patient monitoring: heart rate, cardiac monitor, continuous pulse ox and blood pressure Approach: right paramedian Location: L3-4 Injection technique: single-shot Needle Needle type: Pencan  Needle gauge: 24 G Needle length: 9 cm Needle insertion depth: 7 cm Assessment Sensory level: T8  Procedure Name: MAC Date/Time: 11/06/2014 1:50 PM Performed by: Lavell Luster Pre-anesthesia Checklist: Patient identified, Emergency Drugs available, Suction available, Patient being monitored and Timeout performed Patient Re-evaluated:Patient Re-evaluated prior to inductionOxygen Delivery Method: Simple face mask Preoxygenation: Pre-oxygenation with 100% oxygen Intubation Type: IV induction Placement Confirmation: positive ETCO2 Dental Injury: Teeth and Oropharynx as per pre-operative assessment

## 2014-11-06 NOTE — Discharge Instructions (Signed)

## 2014-11-06 NOTE — Anesthesia Postprocedure Evaluation (Signed)
Anesthesia Post Note  Patient: [name]  Procedure(s) Performed: Procedure(s) (LRB): RIGHT TOTAL HIP ARTHROPLASTY ANTERIOR APPROACH (Right)  Anesthesia type: MAC  Patient location: PACU  Post pain: Pain level controlled and Adequate analgesia  Post assessment: Post-op Vital signs reviewed, Patient's Cardiovascular Status Stable and Respiratory Function Stable  Last Vitals:  Filed Vitals:   11/06/14 1715  BP: 131/78  Pulse: 58  Temp: 36 C  Resp: 12    Post vital signs: Reviewed and stable  Level of consciousness: awake, alert  and oriented  Complications: No apparent anesthesia complications

## 2014-11-07 ENCOUNTER — Encounter (HOSPITAL_COMMUNITY): Payer: Self-pay | Admitting: Orthopedic Surgery

## 2014-11-07 DIAGNOSIS — M1611 Unilateral primary osteoarthritis, right hip: Secondary | ICD-10-CM | POA: Diagnosis present

## 2014-11-07 LAB — HEMOGLOBIN A1C
Hgb A1c MFr Bld: 9.2 % — ABNORMAL HIGH (ref 4.8–5.6)
Mean Plasma Glucose: 217 mg/dL

## 2014-11-07 MED ORDER — DIAZEPAM 5 MG PO TABS: 5.0000 mg | ORAL_TABLET | Freq: Every evening | ORAL | Status: DC | PRN

## 2014-11-07 NOTE — Evaluation (Signed)
Occupational Therapy Evaluation Patient Details Name: [name] MRN: [id number] DOB: 11-Nov-1940 Today's Date: 11/07/2014    History of Present Illness 74 y.o. s/p Rt direct anterior THA.   Clinical Impression   Pt s/p above. Pt independent with ADLs, PTA. Education provided in session and pt will have family assist at home. OT signing off.     Follow Up Recommendations  No OT follow up;Supervision - Intermittent    Equipment Recommendations   (sockaid )    Recommendations for Other Services       Precautions / Restrictions Precautions Precautions: Fall Restrictions Weight Bearing Restrictions: No      Mobility Bed Mobility               General bed mobility comments: not assessed  Transfers Overall transfer level: Needs assistance   Transfers: Sit to/from Stand Sit to Stand: Min guard     Comments: RW used for support          Balance    Min guard for ambulation with RW.                                        ADL Overall ADL's : Needs assistance/impaired                     Lower Body Dressing: Minimal-Moderate assistance;Sit to/from stand;With adaptive equipment   Toilet Transfer: Min guard;Ambulation;RW (sit to stand from chair)           Functional mobility during ADLs: Min guard;Rolling walker General ADL Comments: Practiced using sockaid and suggested baby powder (may help foot slide easier in sockaid). Educated on LB dressing technique. Pt familiar with AE. Pt planning to sponge bathe for now. Educated on safety such as sitting for LB ADLs, safe footwear, and use of bag on walker.      Vision     Perception     Praxis      Pertinent Vitals/Pain Pain Assessment: 0-10 Pain Score: 4  Pain Location: Rt hip  Pain Descriptors / Indicators: Burning Pain Intervention(s): Monitored during session;Repositioned     Hand Dominance Right   Extremity/Trunk Assessment Upper Extremity Assessment Upper  Extremity Assessment: Overall WFL for tasks assessed   Lower Extremity Assessment Lower Extremity Assessment: Defer to PT evaluation       Communication Communication Communication: No difficulties   Cognition Arousal/Alertness: Awake/alert Behavior During Therapy: WFL for tasks assessed/performed Overall Cognitive Status: Within Functional Limits for tasks assessed                     General Comments       Exercises       Shoulder Instructions      Home Living Family/patient expects to be discharged to:: Private residence Living Arrangements: Spouse/significant other Available Help at Discharge: Family;Available 24 hours/day Type of Home: House Home Access: Ramped entrance     Home Layout: One level               Home Equipment: Toilet riser;Shower seat;Adaptive equipment;Walker - standard;Cane - single point;Bedside commode Adaptive Equipment: Reacher;Long-handled shoe horn;Long-handled sponge (device to get socks off)    Comments: Some information taken from previous admission     Prior Functioning/Environment Level of Independence: Independent with ADLs       Comments: unsure if he was using a device, PTA (refer to PT  evaluation)    OT Diagnosis: Acute pain   OT Problem List:     OT Treatment/Interventions:      OT Goals(Current goals can be found in the care plan section)    OT Frequency:     Barriers to D/C:            Co-evaluation              End of Session Equipment Utilized During Treatment: Gait belt;Rolling walker  Activity Tolerance: Patient tolerated treatment well Patient left: in chair;with call bell/phone within reach   Time: 8184-0375 OT Time Calculation (min): 19 min Charges:  OT General Charges $OT Visit: 1 Procedure OT Evaluation $Initial OT Evaluation Tier I: 1 Procedure G-CodesBenito Mccreedy OTR/L [id number] 11/07/2014, 11:58 AM

## 2014-11-07 NOTE — Evaluation (Signed)
Physical Therapy Evaluation Patient Details Name: [name] MRN: [id number] DOB: Dec 19, 1940 Today's Date: 11/07/2014   History of Present Illness  74 y.o. s/p R direct anterior THA on 11/06/14. Pt is WBAT.  Clinical Impression  This patient presents with acute pain and decreased functional independence following the above mentioned procedure. At the time of PT eval, pt was able to perform transfer to EOB with mod assist, and transfer to chair with min guard assist. Pt was limited by nausea and dizziness once upright. This patient is appropriate for skilled PT interventions to address functional limitations, improve safety and independence with functional mobility, and return to PLOF.     Follow Up Recommendations Home health PT;Supervision/Assistance - 24 hour    Equipment Recommendations  None recommended by PT    Recommendations for Other Services       Precautions / Restrictions Precautions Precautions: Fall Precaution Comments: Direct anterior approach Restrictions Weight Bearing Restrictions: No      Mobility  Bed Mobility Overal bed mobility: Needs Assistance Bed Mobility: Supine to Sit     Supine to sit: Mod assist     General bed mobility comments: Pt was able to advance LE's to EOB with some assist for movement. Mod assist to elevate trunk into full sitting position. Pt required increased time and was trying not to put weight through the R hip in sitting.   Transfers Overall transfer level: Needs assistance Equipment used: Rolling walker (2 wheeled) Transfers: Sit to/from Stand Sit to Stand: Min guard         General transfer comment: From elevated bed surface. Pt with hands-on guarding to power-up to full standing position. No knee buckling noted.   Ambulation/Gait Ambulation/Gait assistance: Min guard Ambulation Distance (Feet): 5 Feet Assistive device: Rolling walker (2 wheeled) Gait Pattern/deviations: Step-to pattern;Decreased stride  length;Trunk flexed     General Gait Details: Pt very nauseated and dizzy once sitting up and ambulation was limited to pivotal steps around to the recliner.   Stairs            Wheelchair Mobility    Modified Rankin (Stroke Patients Only)       Balance Overall balance assessment: Needs assistance Sitting-balance support: Feet supported;No upper extremity supported Sitting balance-Leahy Scale: Poor Sitting balance - Comments: UE support required as pt was trying to keep weight off the R hip in sitting.  Postural control: Left lateral lean Standing balance support: Bilateral upper extremity supported Standing balance-Leahy Scale: Poor Standing balance comment: Pt required UE support to maintain standing balance at this time.                              Pertinent Vitals/Pain Pain Assessment: 0-10 Pain Score: 2  (At rest) Pain Location: R hip Pain Descriptors / Indicators: Aching;Operative site guarding Pain Intervention(s): Limited activity within patient's tolerance;Monitored during session;Repositioned    Home Living Family/patient expects to be discharged to:: Private residence Living Arrangements: Spouse/significant other Available Help at Discharge: Family;Available 24 hours/day Type of Home: House Home Access: Ramped entrance     Home Layout: One level Home Equipment: Toilet riser;Shower seat;Adaptive equipment;Walker - standard;Cane - single point;Bedside commode      Prior Function Level of Independence: Independent         Comments: Was going through PT prior to surgery     Hand Dominance   Dominant Hand: Right    Extremity/Trunk Assessment   Upper Extremity Assessment:  Overall WFL for tasks assessed           Lower Extremity Assessment: RLE deficits/detail RLE Deficits / Details: Decreased strength and AROM consistent with the above mentioned procedure    Cervical / Trunk Assessment: Normal  Communication    Communication: No difficulties  Cognition Arousal/Alertness: Awake/alert Behavior During Therapy: WFL for tasks assessed/performed Overall Cognitive Status: Within Functional Limits for tasks assessed                      General Comments      Exercises Total Joint Exercises Ankle Circles/Pumps: 10 reps Quad Sets: 10 reps Hip ABduction/ADduction: 10 reps Long Arc Quad: 5 reps      Assessment/Plan    PT Assessment Patient needs continued PT services  PT Diagnosis Difficulty walking;Acute pain   PT Problem List Decreased strength;Decreased range of motion;Decreased activity tolerance;Decreased balance;Decreased mobility;Decreased knowledge of use of DME;Decreased safety awareness;Decreased knowledge of precautions;Pain  PT Treatment Interventions DME instruction;Gait training;Stair training;Functional mobility training;Therapeutic activities;Therapeutic exercise;Neuromuscular re-education;Patient/family education   PT Goals (Current goals can be found in the Care Plan section) Acute Rehab PT Goals Patient Stated Goal: Decrease pain PT Goal Formulation: With patient Time For Goal Achievement: 11/14/14 Potential to Achieve Goals: Good    Frequency 7X/week   Barriers to discharge        Co-evaluation               End of Session Equipment Utilized During Treatment: Gait belt Activity Tolerance: Treatment limited secondary to medical complications (Comment) (Nausea/dizziness. RN aware) Patient left: in chair;with call bell/phone within reach Nurse Communication: Mobility status         Time: 1027-1105 PT Time Calculation (min) (ACUTE ONLY): 38 min   Charges:   PT Evaluation $Initial PT Evaluation Tier I: 1 Procedure PT Treatments $Gait Training: 8-22 mins $Therapeutic Exercise: 8-22 mins   PT G Codes:        Rolinda Roan 12-Nov-2014, 12:25 PM   Rolinda Roan, PT, DPT Acute Rehabilitation Services Pager: [phone]

## 2014-11-07 NOTE — Progress Notes (Signed)
Physical Therapy Treatment Patient Details Name: [name] MRN: [id number] DOB: 04-Jul-1940 Today's Date: 11/07/2014    History of Present Illness 74 y.o. s/p R direct anterior THA on 11/06/14. Pt is WBAT.    PT Comments    Pt progressing well towards physical therapy goals. Was able to perform transfers and ambulation with min guard to supervision for safety. He was able to improve ambulation distance to ~300 feet. Pt showing functional improvement from morning session. As pt has a ramp to enter home, deferred stair training to [place]. Pt states understanding with HEP. Pt and wife anticipate d/c home this afternoon.   Follow Up Recommendations  Home health PT;Supervision/Assistance - 24 hour     Equipment Recommendations  None recommended by PT    Recommendations for Other Services       Precautions / Restrictions Precautions Precautions: Fall Precaution Comments: Direct anterior approach Restrictions Weight Bearing Restrictions: No    Mobility  Bed Mobility Overal bed mobility: Needs Assistance Bed Mobility: Supine to Sit     Supine to sit: Mod assist     General bed mobility comments: Pt was sitting up in recliner upon PT arrival.   Transfers Overall transfer level: Needs assistance Equipment used: Rolling walker (2 wheeled) Transfers: Sit to/from Stand Sit to Stand: Min guard         General transfer comment: From recliner chair. Pt with close guarding to power-up to full standing position. No knee buckling noted.   Ambulation/Gait Ambulation/Gait assistance: Min guard;Supervision Ambulation Distance (Feet): 300 Feet Assistive device: Rolling walker (2 wheeled) Gait Pattern/deviations: Step-through pattern;Decreased stride length;Trunk flexed Gait velocity: Decreased Gait velocity interpretation: Below normal speed for age/gender General Gait Details: Pt initially ambulated 60'. After seated rest break pt ambulated another 240'. Pt was cued for increased  heel strike, improved posture, and step-through gait pattern. Pt reports moderate pain only when WB, and no dizziness.    Stairs            Wheelchair Mobility    Modified Rankin (Stroke Patients Only)       Balance Overall balance assessment: Needs assistance Sitting-balance support: Feet supported;No upper extremity supported Sitting balance-Leahy Scale: Fair Sitting balance - Comments: UE support required as pt was trying to keep weight off the R hip in sitting.  Postural control: Left lateral lean Standing balance support: Bilateral upper extremity supported;During functional activity Standing balance-Leahy Scale: Poor Standing balance comment: Pt required UE support to maintain standing balance at this time.                     Cognition Arousal/Alertness: Awake/alert Behavior During Therapy: WFL for tasks assessed/performed Overall Cognitive Status: Within Functional Limits for tasks assessed                      Exercises Total Joint Exercises Ankle Circles/Pumps: 10 reps Quad Sets: 10 reps Hip ABduction/ADduction: 10 reps Long Arc Quad: 5 reps    General Comments General comments (skin integrity, edema, etc.): Reviewed HEP and issued handout. Discussed reps/sets and frequency with pt and wife.       Pertinent Vitals/Pain Pain Assessment: 0-10 Pain Score: 5  Pain Location: R hip during ambulation Pain Descriptors / Indicators: Aching;Burning Pain Intervention(s): Limited activity within patient's tolerance;Monitored during session;Repositioned    Home Living Family/patient expects to be discharged to:: Private residence Living Arrangements: Spouse/significant other Available Help at Discharge: Family;Available 24 hours/day Type of Home: House Home Access: Ramped  entrance   Home Layout: One level Home Equipment: Toilet riser;Shower seat;Adaptive equipment;Walker - standard;Cane - single point;Bedside commode      Prior Function Level  of Independence: Independent      Comments: Was going through PT prior to surgery   PT Goals (current goals can now be found in the care plan section) Acute Rehab PT Goals Patient Stated Goal: Decrease pain PT Goal Formulation: With patient Time For Goal Achievement: 11/14/14 Potential to Achieve Goals: Good Progress towards PT goals: Progressing toward goals    Frequency  7X/week    PT Plan Current plan remains appropriate    Co-evaluation             End of Session Equipment Utilized During Treatment: Gait belt Activity Tolerance: Patient tolerated treatment well Patient left: in chair;with call bell/phone within reach;with family/visitor present     Time: 1400-1440 PT Time Calculation (min) (ACUTE ONLY): 40 min  Charges:  $Gait Training: 23-37 mins $Therapeutic Exercise: 8-22 mins $Therapeutic Activity: 8-22 mins                    G Codes:      Rolinda Roan Nov 14, 2014, 2:58 PM   Rolinda Roan, PT, DPT Acute Rehabilitation Services Pager: [phone]

## 2014-11-07 NOTE — Progress Notes (Signed)
     Subjective:  POD#1 RTHA. Patient reports pain as mild to moderate.  Resting comfortably in bed this morning.    Objective:   VITALS:   Filed Vitals:   11/06/14 2029 11/06/14 2214 11/06/14 2323 11/07/14 0606  BP: 143/85 155/78 133/76 137/73  Pulse: 89 93 98 85  Temp: 99.1 F (37.3 C) 98.9 F (37.2 C)  98.7 F (37.1 C)  TempSrc: Oral Oral  Oral  Resp: 18 18 18 18   Height:      Weight:      SpO2: 97%   94%    Neurologically intact ABD soft Neurovascular intact Sensation intact distally Intact pulses distally Dorsiflexion/Plantar flexion intact Incision: dressing C/D/I   Lab Results  Component Value Date   WBC 10.4 10/25/2014   HGB 15.5 10/25/2014   HCT 44.8 10/25/2014   MCV 90.1 10/25/2014   PLT 243 10/25/2014   BMET    Component Value Date/Time   NA 140 10/25/2014 1234   K 4.0 10/25/2014 1234   CL 107 10/25/2014 1234   CO2 26 10/25/2014 1234   GLUCOSE 200* 10/25/2014 1234   BUN 38* 10/25/2014 1234   CREATININE 1.61* 10/25/2014 1234   CALCIUM 9.9 10/25/2014 1234   GFRNONAA 41* 10/25/2014 1234   GFRAA 47* 10/25/2014 1234     Assessment/Plan: 1 Day Post-Op   Active Problems:   DJD (degenerative joint disease)   Up with therapy WBAT in the RLE ASA 325mg  daily for DVT prophylaxis Plan to discharge home today after two sessions with PT as long as no difficulty.     Jason Hauge Lelan Pons 11/07/2014, 8:17 AM Cell [phone]

## 2014-11-07 NOTE — Care Management Note (Signed)
Case Management Note  Patient Details  Name: [name] MRN: [id number] Date of Birth: Nov 27, 1940  Subjective/Objective:     Patient is s/p total hip surgery, possibility for home today, he is set up with [facility] for HHPT, and rolling walker and bsc.  NCM notified Jermaine with [facility] regarding DME., and notified Miranda with [place].              Action/Plan:   Expected Discharge Date:                  Expected Discharge Plan:  [place]  In-House Referral:     Discharge planning Services  CM Consult  Post Acute Care Choice:  Durable Medical Equipment, Home Health Choice offered to:  Patient  DME Arranged:  Bedside commode, Walker rolling DME Agency:  [place]:  PT [place]:  [place]  Status of Service:  Completed, signed off  Medicare Important Message Given:    Date Medicare IM Given:    Medicare IM give by:    Date Additional Medicare IM Given:    Additional Medicare Important Message give by:     If discussed at [place] of Stay Meetings, dates discussed:    Additional Comments:  Zenon Mayo, RN 11/07/2014, 11:29 AM

## 2014-11-07 NOTE — Progress Notes (Signed)
Pt discharging to home. IV dc'd,Vitals stable. 0821  Second progress note is acutally discharge note by PA-C. Clarified this with her. Education,Rx's and discharge information given to patient and wife, All questions addressed. 11/07/2014 5:57 PM Lavarius Doughten

## 2014-11-07 NOTE — Discharge Summary (Signed)
Physician Discharge Summary  Patient ID: [name] MRN: [id number] DOB/AGE: 08-08-1940 74 y.o.  Admit date: 11/06/2014 Discharge date: 11/07/2014  Admission Diagnoses:  Primary osteoarthritis of right hip  Discharge Diagnoses:  Principal Problem:   Primary osteoarthritis of right hip Active Problems:   DJD (degenerative joint disease)   Past Medical History  Diagnosis Date  . Kidney stones   . Hypertension   . Diabetes mellitus type 2 in obese   . PONV (postoperative nausea and vomiting)   . GERD (gastroesophageal reflux disease)     occ indigestion  . Arthritis     arthirtis all over  . Neuromuscular disorder     restless leg syndrome  . Right knee DJD   . Hyperlipemia   . Frequent urination at night   . Primary osteoarthritis of right knee   . Primary localized osteoarthritis of right hip   . Spinal stenosis of lumbar region with radiculopathy   . PONV (postoperative nausea and vomiting)   . Glaucoma     both eyes    Surgeries: Procedure(s): RIGHT TOTAL HIP ARTHROPLASTY ANTERIOR APPROACH on 11/06/2014   Consultants (if any):    Discharged Condition: Improved  Hospital Course: [name] is an 74 y.o. male who was admitted 11/06/2014 with a diagnosis of Primary osteoarthritis of right hip and went to the operating room on 11/06/2014 and underwent the above named procedures.    He was given perioperative antibiotics:  Anti-infectives    Start     Dose/Rate Route Frequency Ordered Stop   11/06/14 1630  ceFAZolin (ANCEF) IVPB 2 g/50 mL premix     2 g 100 mL/hr over 30 Minutes Intravenous Every 6 hours 11/06/14 1625 11/07/14 0429   11/06/14 1019  ceFAZolin (ANCEF) IVPB 2 g/50 mL premix     2 g 100 mL/hr over 30 Minutes Intravenous On call to O.R. 11/06/14 1019 11/06/14 1436    .  He was given sequential compression devices, early ambulation, and ASA 325mg  for DVT prophylaxis.  He benefited maximally from the hospital stay and there were no complications.     Recent vital signs:  Filed Vitals:   11/07/14 0606  BP: 137/73  Pulse: 85  Temp: 98.7 F (37.1 C)  Resp: 18    Recent laboratory studies:  Lab Results  Component Value Date   HGB 15.5 10/25/2014   HGB 12.9* 01/11/2014   HGB 12.6* 01/10/2014   Lab Results  Component Value Date   WBC 10.4 10/25/2014   PLT 243 10/25/2014   Lab Results  Component Value Date   INR 1.10 10/25/2014   Lab Results  Component Value Date   NA 140 10/25/2014   K 4.0 10/25/2014   CL 107 10/25/2014   CO2 26 10/25/2014   BUN 38* 10/25/2014   CREATININE 1.61* 10/25/2014   GLUCOSE 200* 10/25/2014    Discharge Medications:     Medication List    STOP taking these medications        aspirin EC 81 MG tablet  Replaced by:  aspirin 325 MG tablet      TAKE these medications        allopurinol 300 MG tablet  Commonly known as:  ZYLOPRIM  Take 150 mg by mouth every morning.     amLODipine 10 MG tablet  Commonly known as:  NORVASC  Take 10 mg by mouth daily.     aspirin 325 MG tablet  Take 1 tablet (325 mg total) by mouth  daily.     atorvastatin 10 MG tablet  Commonly known as:  LIPITOR  Take 10 mg by mouth at bedtime.     diazepam 5 MG tablet  Commonly known as:  VALIUM  Take 1 tablet (5 mg total) by mouth at bedtime as needed.     DSS 100 MG Caps  Take 100 mg by mouth 2 (two) times daily.     gemfibrozil 600 MG tablet  Commonly known as:  LOPID  Take 600 mg by mouth 2 (two) times daily.     glipiZIDE 10 MG tablet  Commonly known as:  GLUCOTROL  Take 10 mg by mouth 2 (two) times daily before a meal.     HYDROcodone-acetaminophen 5-325 MG per tablet  Commonly known as:  NORCO  Take 1-2 tablets by mouth every 6 (six) hours as needed for moderate pain.     meloxicam 15 MG tablet  Commonly known as:  MOBIC  Take 15 mg by mouth daily.     metformin 1000 MG (OSM) 24 hr tablet  Commonly known as:  FORTAMET  Take 1,000 mg by mouth daily with breakfast.     methocarbamol  500 MG tablet  Commonly known as:  ROBAXIN  Take 1 tablet (500 mg total) by mouth 4 (four) times daily.     Omega 3 1000 MG Caps  Take 1,000 mg by mouth daily.     ondansetron 4 MG tablet  Commonly known as:  ZOFRAN  Take 1 tablet (4 mg total) by mouth every 8 (eight) hours as needed for nausea or vomiting.     polyethylene glycol packet  Commonly known as:  MIRALAX / GLYCOLAX  Take 17 g by mouth 2 (two) times daily.     rOPINIRole 2 MG tablet  Commonly known as:  REQUIP  Take 2 mg by mouth at bedtime.     tamsulosin 0.4 MG Caps capsule  Commonly known as:  FLOMAX  Take 0.4 mg by mouth at bedtime.     Vitamin D-3 5000 UNITS Tabs  Take 5,000 Units by mouth daily.     VITRUM 50+ SENIOR MULTI PO  Take 1 tablet by mouth daily.        Diagnostic Studies: Dg Pelvis Portable  11/06/2014   CLINICAL DATA:  Status post right hip arthroplasty.  EXAM: PORTABLE PELVIS 1-2 VIEWS  COMPARISON:  None.  FINDINGS: On the single AP view, the right hip arthroplasty components are well-seated and aligned. There is no acute fracture or evidence of an operative complication.  IMPRESSION: Well-aligned right hip total arthroplasty.   Electronically Signed   By: Lajean Manes M.D.   On: 11/06/2014 16:40    Disposition: 06-Home-Health Care Svc      Discharge Instructions    Weight bearing as tolerated    Complete by:  As directed   Laterality:  right  Extremity:  Lower           Follow-up Information    Follow up with MURPHY, TIMOTHY D, MD In 10 days.   Specialty:  Orthopedic Surgery   Contact information:   [address] [place] [address] [phone]        Signed: Gae Dry 11/07/2014, 8:22 AM Cell [phone]

## 2014-11-09 ENCOUNTER — Encounter (HOSPITAL_COMMUNITY): Payer: Self-pay | Admitting: Emergency Medicine

## 2014-11-09 ENCOUNTER — Emergency Department (HOSPITAL_COMMUNITY): Payer: PPO | Admitting: Anesthesiology

## 2014-11-09 ENCOUNTER — Encounter (HOSPITAL_COMMUNITY)
Admission: EM | Disposition: A | Discharge status: Home / Self Care | Payer: Self-pay | Source: Home / Self Care | Attending: Emergency Medicine

## 2014-11-09 ENCOUNTER — Emergency Department (HOSPITAL_COMMUNITY): Payer: PPO

## 2014-11-09 ENCOUNTER — Ambulatory Visit (HOSPITAL_COMMUNITY)
Admission: EM | Admit: 2014-11-09 | Discharge: 2014-11-10 | Disposition: A | Discharge status: Home / Self Care | Payer: PPO | Attending: Emergency Medicine | Admitting: Emergency Medicine

## 2014-11-09 ENCOUNTER — Ambulatory Visit: Admission: AD | Admit: 2014-11-09 | Payer: Self-pay | Source: Ambulatory Visit | Admitting: Orthopedic Surgery

## 2014-11-09 DIAGNOSIS — W07XXXA Fall from chair, initial encounter: Secondary | ICD-10-CM | POA: Insufficient documentation

## 2014-11-09 DIAGNOSIS — Y998 Other external cause status: Secondary | ICD-10-CM | POA: Diagnosis not present

## 2014-11-09 DIAGNOSIS — Z87442 Personal history of urinary calculi: Secondary | ICD-10-CM | POA: Diagnosis not present

## 2014-11-09 DIAGNOSIS — Y92098 Other place in other non-institutional residence as the place of occurrence of the external cause: Secondary | ICD-10-CM | POA: Insufficient documentation

## 2014-11-09 DIAGNOSIS — G2581 Restless legs syndrome: Secondary | ICD-10-CM | POA: Insufficient documentation

## 2014-11-09 DIAGNOSIS — Z885 Allergy status to narcotic agent status: Secondary | ICD-10-CM | POA: Diagnosis not present

## 2014-11-09 DIAGNOSIS — Z7982 Long term (current) use of aspirin: Secondary | ICD-10-CM | POA: Insufficient documentation

## 2014-11-09 DIAGNOSIS — Y9389 Activity, other specified: Secondary | ICD-10-CM | POA: Diagnosis not present

## 2014-11-09 DIAGNOSIS — M4806 Spinal stenosis, lumbar region: Secondary | ICD-10-CM | POA: Diagnosis not present

## 2014-11-09 DIAGNOSIS — M1711 Unilateral primary osteoarthritis, right knee: Secondary | ICD-10-CM | POA: Insufficient documentation

## 2014-11-09 DIAGNOSIS — H4089 Other specified glaucoma: Secondary | ICD-10-CM | POA: Diagnosis not present

## 2014-11-09 DIAGNOSIS — I10 Essential (primary) hypertension: Secondary | ICD-10-CM | POA: Insufficient documentation

## 2014-11-09 DIAGNOSIS — M1611 Unilateral primary osteoarthritis, right hip: Secondary | ICD-10-CM | POA: Diagnosis not present

## 2014-11-09 DIAGNOSIS — K219 Gastro-esophageal reflux disease without esophagitis: Secondary | ICD-10-CM | POA: Insufficient documentation

## 2014-11-09 DIAGNOSIS — E119 Type 2 diabetes mellitus without complications: Secondary | ICD-10-CM | POA: Insufficient documentation

## 2014-11-09 DIAGNOSIS — IMO0001 Reserved for inherently not codable concepts without codable children: Secondary | ICD-10-CM

## 2014-11-09 DIAGNOSIS — S73004A Unspecified dislocation of right hip, initial encounter: Secondary | ICD-10-CM

## 2014-11-09 DIAGNOSIS — E785 Hyperlipidemia, unspecified: Secondary | ICD-10-CM | POA: Insufficient documentation

## 2014-11-09 DIAGNOSIS — T84020A Dislocation of internal right hip prosthesis, initial encounter: Secondary | ICD-10-CM | POA: Insufficient documentation

## 2014-11-09 DIAGNOSIS — Z9889 Other specified postprocedural states: Secondary | ICD-10-CM

## 2014-11-09 DIAGNOSIS — Z96641 Presence of right artificial hip joint: Secondary | ICD-10-CM | POA: Insufficient documentation

## 2014-11-09 DIAGNOSIS — S73006A Unspecified dislocation of unspecified hip, initial encounter: Secondary | ICD-10-CM | POA: Diagnosis present

## 2014-11-09 HISTORY — PX: HIP CLOSED REDUCTION: SHX983

## 2014-11-09 LAB — BASIC METABOLIC PANEL
Anion gap: 11 (ref 5–15)
BUN: 38 mg/dL — ABNORMAL HIGH (ref 6–20)
CO2: 24 mmol/L (ref 22–32)
Calcium: 9.6 mg/dL (ref 8.9–10.3)
Chloride: 98 mmol/L — ABNORMAL LOW (ref 101–111)
Creatinine, Ser: 1.55 mg/dL — ABNORMAL HIGH (ref 0.61–1.24)
GFR calc Af Amer: 50 mL/min — ABNORMAL LOW (ref >60)
GFR calc non Af Amer: 43 mL/min — ABNORMAL LOW (ref >60)
Glucose, Bld: 308 mg/dL — ABNORMAL HIGH (ref 65–99)
Potassium: 3.8 mmol/L (ref 3.5–5.1)
Sodium: 133 mmol/L — ABNORMAL LOW (ref 135–145)

## 2014-11-09 LAB — CBC WITH DIFFERENTIAL/PLATELET
Basophils Absolute: 0 10*3/uL (ref 0.0–0.1)
Basophils Relative: 0 % (ref 0–1)
Eosinophils Absolute: 0.2 10*3/uL (ref 0.0–0.7)
Eosinophils Relative: 3 % (ref 0–5)
HCT: 37.8 % — ABNORMAL LOW (ref 39.0–52.0)
Hemoglobin: 13.2 g/dL (ref 13.0–17.0)
Lymphocytes Relative: 12 % (ref 12–46)
Lymphs Abs: 1.1 10*3/uL (ref 0.7–4.0)
MCH: 31 pg (ref 26.0–34.0)
MCHC: 34.9 g/dL (ref 30.0–36.0)
MCV: 88.7 fL (ref 78.0–100.0)
Monocytes Absolute: 1 10*3/uL (ref 0.1–1.0)
Monocytes Relative: 11 % (ref 3–12)
Neutro Abs: 6.6 10*3/uL (ref 1.7–7.7)
Neutrophils Relative %: 74 % (ref 43–77)
Platelets: 225 10*3/uL (ref 150–400)
RBC: 4.26 MIL/uL (ref 4.22–5.81)
RDW: 14.1 % (ref 11.5–15.5)
WBC: 9 10*3/uL (ref 4.0–10.5)

## 2014-11-09 LAB — GLUCOSE, CAPILLARY
Glucose-Capillary: 294 mg/dL — ABNORMAL HIGH (ref 65–99)
Glucose-Capillary: 477 mg/dL — ABNORMAL HIGH (ref 65–99)

## 2014-11-09 SURGERY — CLOSED REDUCTION, HIP
Anesthesia: General | Site: Hip | Laterality: Right

## 2014-11-09 SURGERY — CLOSED REDUCTION, HIP
Anesthesia: General | Laterality: Right

## 2014-11-09 MED ORDER — CHLORHEXIDINE GLUCONATE 4 % EX LIQD: 60.0000 mL | Freq: Once | CUTANEOUS | Status: DC

## 2014-11-09 MED ORDER — AMLODIPINE BESYLATE 10 MG PO TABS
10.0000 mg | ORAL_TABLET | Freq: Every day | ORAL | Status: DC
  Administered 2014-11-10: 10 mg via ORAL
  Filled 2014-11-09: qty 1

## 2014-11-09 MED ORDER — SUCCINYLCHOLINE CHLORIDE 20 MG/ML IJ SOLN
INTRAMUSCULAR | Status: DC | PRN
  Administered 2014-11-09: 100 mg via INTRAVENOUS

## 2014-11-09 MED ORDER — ATORVASTATIN CALCIUM 10 MG PO TABS
10.0000 mg | ORAL_TABLET | Freq: Every day | ORAL | Status: DC
  Administered 2014-11-09: 10 mg via ORAL
  Filled 2014-11-09: qty 1

## 2014-11-09 MED ORDER — LIDOCAINE HCL (CARDIAC) 20 MG/ML IV SOLN
INTRAVENOUS | Status: DC | PRN
  Administered 2014-11-09: 30 mg via INTRAVENOUS

## 2014-11-09 MED ORDER — ONDANSETRON HCL 4 MG PO TABS: 4.0000 mg | ORAL_TABLET | Freq: Four times a day (QID) | ORAL | Status: DC | PRN

## 2014-11-09 MED ORDER — MIDAZOLAM HCL 2 MG/2ML IJ SOLN
INTRAMUSCULAR | Status: AC
  Filled 2014-11-09: qty 2

## 2014-11-09 MED ORDER — MAGNESIUM CITRATE PO SOLN: 1.0000 | Freq: Once | ORAL | Status: DC | PRN

## 2014-11-09 MED ORDER — DOCUSATE SODIUM 100 MG PO CAPS
100.0000 mg | ORAL_CAPSULE | Freq: Two times a day (BID) | ORAL | Status: DC
  Administered 2014-11-09 – 2014-11-10 (×2): 100 mg via ORAL
  Filled 2014-11-09 (×2): qty 1

## 2014-11-09 MED ORDER — DIAZEPAM 5 MG PO TABS: 5.0000 mg | ORAL_TABLET | Freq: Every evening | ORAL | Status: DC | PRN

## 2014-11-09 MED ORDER — ROPINIROLE HCL 1 MG PO TABS
2.0000 mg | ORAL_TABLET | Freq: Every day | ORAL | Status: DC
  Administered 2014-11-09: 2 mg via ORAL
  Filled 2014-11-09: qty 2

## 2014-11-09 MED ORDER — FENTANYL CITRATE (PF) 250 MCG/5ML IJ SOLN
INTRAMUSCULAR | Status: AC
  Filled 2014-11-09: qty 5

## 2014-11-09 MED ORDER — GLIPIZIDE 5 MG PO TABS
10.0000 mg | ORAL_TABLET | Freq: Two times a day (BID) | ORAL | Status: DC
  Administered 2014-11-09 – 2014-11-10 (×2): 10 mg via ORAL
  Filled 2014-11-09 (×2): qty 2

## 2014-11-09 MED ORDER — FENTANYL CITRATE (PF) 100 MCG/2ML IJ SOLN
50.0000 ug | Freq: Once | INTRAMUSCULAR | Status: AC
  Administered 2014-11-09: 50 ug via INTRAVENOUS
  Filled 2014-11-09: qty 2

## 2014-11-09 MED ORDER — ASPIRIN 325 MG PO TABS
325.0000 mg | ORAL_TABLET | Freq: Every day | ORAL | Status: DC
  Administered 2014-11-10: 325 mg via ORAL
  Filled 2014-11-09: qty 1

## 2014-11-09 MED ORDER — METOCLOPRAMIDE HCL 5 MG PO TABS: 5.0000 mg | ORAL_TABLET | Freq: Three times a day (TID) | ORAL | Status: DC | PRN

## 2014-11-09 MED ORDER — METHOCARBAMOL 1000 MG/10ML IJ SOLN
500.0000 mg | Freq: Four times a day (QID) | INTRAVENOUS | Status: DC | PRN
  Filled 2014-11-09: qty 5

## 2014-11-09 MED ORDER — LACTATED RINGERS IV SOLN
INTRAVENOUS | Status: DC | PRN
  Administered 2014-11-09: 12:00:00 via INTRAVENOUS

## 2014-11-09 MED ORDER — GEMFIBROZIL 600 MG PO TABS
600.0000 mg | ORAL_TABLET | Freq: Two times a day (BID) | ORAL | Status: DC
  Administered 2014-11-09 – 2014-11-10 (×2): 600 mg via ORAL
  Filled 2014-11-09 (×3): qty 1

## 2014-11-09 MED ORDER — PROPOFOL 10 MG/ML IV BOLUS
INTRAVENOUS | Status: DC | PRN
  Administered 2014-11-09: 120 mg via INTRAVENOUS

## 2014-11-09 MED ORDER — TAMSULOSIN HCL 0.4 MG PO CAPS
0.4000 mg | ORAL_CAPSULE | Freq: Every day | ORAL | Status: DC
  Administered 2014-11-09: 0.4 mg via ORAL
  Filled 2014-11-09: qty 1

## 2014-11-09 MED ORDER — POTASSIUM CHLORIDE IN NACL 20-0.45 MEQ/L-% IV SOLN
INTRAVENOUS | Status: DC
  Filled 2014-11-09 (×3): qty 1000

## 2014-11-09 MED ORDER — CEFAZOLIN SODIUM-DEXTROSE 2-3 GM-% IV SOLR: 2.0000 g | INTRAVENOUS | Status: DC

## 2014-11-09 MED ORDER — MEPERIDINE HCL 25 MG/ML IJ SOLN: 6.2500 mg | INTRAMUSCULAR | Status: DC | PRN

## 2014-11-09 MED ORDER — POTASSIUM CHLORIDE IN NACL 20-0.9 MEQ/L-% IV SOLN
INTRAVENOUS | Status: DC
  Administered 2014-11-09: 16:00:00 via INTRAVENOUS
  Filled 2014-11-09: qty 1000

## 2014-11-09 MED ORDER — METFORMIN HCL ER 500 MG PO TB24
1000.0000 mg | ORAL_TABLET | Freq: Every day | ORAL | Status: DC
  Administered 2014-11-10: 1000 mg via ORAL
  Filled 2014-11-09: qty 2

## 2014-11-09 MED ORDER — METOCLOPRAMIDE HCL 5 MG/ML IJ SOLN: 5.0000 mg | Freq: Three times a day (TID) | INTRAMUSCULAR | Status: DC | PRN

## 2014-11-09 MED ORDER — BISACODYL 5 MG PO TBEC: 5.0000 mg | DELAYED_RELEASE_TABLET | Freq: Every day | ORAL | Status: DC | PRN

## 2014-11-09 MED ORDER — OXYCODONE-ACETAMINOPHEN 5-325 MG PO TABS
1.0000 | ORAL_TABLET | ORAL | Status: DC | PRN
  Administered 2014-11-09 – 2014-11-10 (×2): 1 via ORAL
  Filled 2014-11-09: qty 2
  Filled 2014-11-09: qty 1

## 2014-11-09 MED ORDER — PROMETHAZINE HCL 25 MG/ML IJ SOLN: 6.2500 mg | INTRAMUSCULAR | Status: DC | PRN

## 2014-11-09 MED ORDER — ACETAMINOPHEN 500 MG PO TABS: 1000.0000 mg | ORAL_TABLET | Freq: Once | ORAL | Status: DC

## 2014-11-09 MED ORDER — DEXAMETHASONE SODIUM PHOSPHATE 10 MG/ML IJ SOLN
INTRAMUSCULAR | Status: DC | PRN
  Administered 2014-11-09: 10 mg via INTRAVENOUS

## 2014-11-09 MED ORDER — ALLOPURINOL 300 MG PO TABS
150.0000 mg | ORAL_TABLET | Freq: Every day | ORAL | Status: DC
  Administered 2014-11-10: 150 mg via ORAL
  Filled 2014-11-09: qty 1

## 2014-11-09 MED ORDER — HYDROMORPHONE HCL 1 MG/ML IJ SOLN: 0.2500 mg | INTRAMUSCULAR | Status: DC | PRN

## 2014-11-09 MED ORDER — INSULIN ASPART 100 UNIT/ML ~~LOC~~ SOLN
0.0000 [IU] | Freq: Three times a day (TID) | SUBCUTANEOUS | Status: DC
  Administered 2014-11-10: 7 [IU] via SUBCUTANEOUS
  Administered 2014-11-10: 5 [IU] via SUBCUTANEOUS

## 2014-11-09 MED ORDER — ONDANSETRON HCL 4 MG/2ML IJ SOLN
INTRAMUSCULAR | Status: DC | PRN
  Administered 2014-11-09: 4 mg via INTRAVENOUS

## 2014-11-09 MED ORDER — HYDROMORPHONE HCL 1 MG/ML IJ SOLN: 0.5000 mg | INTRAMUSCULAR | Status: DC | PRN

## 2014-11-09 MED ORDER — METHOCARBAMOL 500 MG PO TABS
500.0000 mg | ORAL_TABLET | Freq: Four times a day (QID) | ORAL | Status: DC | PRN
  Administered 2014-11-09: 500 mg via ORAL
  Filled 2014-11-09: qty 1

## 2014-11-09 MED ORDER — SENNOSIDES-DOCUSATE SODIUM 8.6-50 MG PO TABS: 1.0000 | ORAL_TABLET | Freq: Every evening | ORAL | Status: DC | PRN

## 2014-11-09 MED ORDER — ONDANSETRON HCL 4 MG/2ML IJ SOLN: 4.0000 mg | Freq: Four times a day (QID) | INTRAMUSCULAR | Status: DC | PRN

## 2014-11-09 MED ORDER — FENTANYL CITRATE (PF) 100 MCG/2ML IJ SOLN
INTRAMUSCULAR | Status: DC | PRN
  Administered 2014-11-09: 150 ug via INTRAVENOUS

## 2014-11-09 MED ORDER — MIDAZOLAM HCL 2 MG/2ML IJ SOLN: 0.5000 mg | Freq: Once | INTRAMUSCULAR | Status: DC | PRN

## 2014-11-09 NOTE — ED Notes (Signed)
To ED from home via [facility] with c/o slipped while getting up, fell into chair, had right hip surgery on Tuesday of this week-- discharged home on Wednesday.

## 2014-11-09 NOTE — Anesthesia Preprocedure Evaluation (Addendum)
Anesthesia Evaluation  Patient identified by MRN, date of birth, ID band Patient awake    Reviewed: Allergy & Precautions, NPO status , Patient's Chart, lab work & pertinent test results  History of Anesthesia Complications Negative for: history of anesthetic complications  Airway Mallampati: II  TM Distance: >3 FB Neck ROM: Full    Dental  (+) Dental Advidsory Given   Pulmonary  breath sounds clear to auscultation        Cardiovascular hypertension, Pt. on medications - anginaRhythm:Regular Rate:Normal     Neuro/Psych negative neurological ROS     GI/Hepatic Neg liver ROS, GERD-  Medicated and Controlled,  Endo/Other  diabetes (glu 308), Oral Hypoglycemic AgentsMorbid obesity  Renal/GU negative Renal ROS     Musculoskeletal   Abdominal (+) + obese,   Peds  Hematology negative hematology ROS (+)   Anesthesia Other Findings   Reproductive/Obstetrics                          Anesthesia Physical Anesthesia Plan  ASA: III  Anesthesia Plan: General   Post-op Pain Management:    Induction: Intravenous  Airway Management Planned: Oral ETT  Additional Equipment:   Intra-op Plan:   Post-operative Plan: Extubation in OR  Informed Consent: I have reviewed the patients History and Physical, chart, labs and discussed the procedure including the risks, benefits and alternatives for the proposed anesthesia with the patient or authorized representative who has indicated his/her understanding and acceptance.   Dental Advisory Given  Plan Discussed with: CRNA and Surgeon  Anesthesia Plan Comments: (Plan routine monitors, GETA)       Anesthesia Quick Evaluation

## 2014-11-09 NOTE — ED Provider Notes (Signed)
CSN: [id number]     Arrival date & time 11/09/14  7169 History   First MD Initiated Contact with Patient 11/09/14 [phone]     Chief Complaint  Patient presents with  . Hip Pain     Patient is a 74 y.o. male presenting with hip pain. The history is provided by the patient. No language interpreter was used.  Hip Pain   [name] presents for evaluation of injuries following a fall. He had a right hip replacement 3 days ago and was discharged home 2 days ago. He is doing well at home when he was sitting in his recliner and went to get up to use the bathroom. He is wearing his TED hose and slipped on the hardwood floor and fell to the ground landing on his right hip. Reports immediate pain in the right hip. He was able to get up and stand on the hip but has pain with standing and bearing weight. Pain is over the anterior lateral hip. He denies a head injury or loss of consciousness. He took a hydrocodone prior to ED arrival.  Past Medical History  Diagnosis Date  . Kidney stones   . Hypertension   . Diabetes mellitus type 2 in obese   . PONV (postoperative nausea and vomiting)   . GERD (gastroesophageal reflux disease)     occ indigestion  . Arthritis     arthirtis all over  . Neuromuscular disorder     restless leg syndrome  . Right knee DJD   . Hyperlipemia   . Frequent urination at night   . Primary osteoarthritis of right knee   . Primary localized osteoarthritis of right hip   . Spinal stenosis of lumbar region with radiculopathy   . PONV (postoperative nausea and vomiting)   . Glaucoma     both eyes   Past Surgical History  Procedure Laterality Date  . Lithotripsy    . Shoulder surgery      right  . Elbow surgery      right  . Abdominal hernia repair    . Total knee arthroplasty Left 07/2006    left knee  . Hernia repair  1980    inguinal  . Shoulder arthroscopy  1980    right  . Elbow arthroscopy  1990    right  . Stone extraction with basket  2010  . Joint  replacement  2008    knee  . Lumbar disc surgery      2015  . Total knee arthroplasty Right 01/08/2014    Procedure: RIGHT TOTAL KNEE ARTHROPLASTY;  Surgeon: Lorn Junes, MD;  Location: [place];  Service: Orthopedics;  Laterality: Right;  . Back surgery    . Total hip arthroplasty Right 11/06/2014    Procedure: RIGHT TOTAL HIP ARTHROPLASTY ANTERIOR APPROACH;  Surgeon: Renette Butters, MD;  Location: [place];  Service: Orthopedics;  Laterality: Right;   Family History  Problem Relation Age of Onset  . Hypertension Mother   . Hypertension Father   . Hypertension Brother   . Kidney disease Mother   . Diabetes Mellitus II Father   . Diabetes Mellitus II Brother   . Diabetes Mellitus II Brother   . Cancer - Prostate Brother    Social History  Substance Use Topics  . Smoking status: Never Smoker   . Smokeless tobacco: None  . Alcohol Use: Yes     Comment: glass of wine every 3-4 months    Review of Systems  All other systems reviewed and are negative.     Allergies  Morphine and related  Home Medications   Prior to Admission medications   Medication Sig Start Date End Date Taking? Authorizing Provider  allopurinol (ZYLOPRIM) 300 MG tablet Take 150 mg by mouth every morning.    Yes Historical Provider, MD  amLODipine (NORVASC) 10 MG tablet Take 10 mg by mouth daily.   Yes Historical Provider, MD  aspirin 325 MG tablet Take 1 tablet (325 mg total) by mouth daily. 11/06/14  Yes Brittney Kelly, PA-C  atorvastatin (LIPITOR) 10 MG tablet Take 10 mg by mouth at bedtime.   Yes Historical Provider, MD  Cholecalciferol (VITAMIN D-3) 5000 UNITS TABS Take 5,000 Units by mouth daily.   Yes Historical Provider, MD  diazepam (VALIUM) 5 MG tablet Take 1 tablet (5 mg total) by mouth at bedtime as needed. Patient taking differently: Take 5 mg by mouth at bedtime as needed for anxiety or muscle spasms.  11/07/14  Yes Brittney Kelly, PA-C  docusate sodium 100 MG CAPS Take 100 mg by mouth 2 (two)  times daily. Patient taking differently: Take 100 mg by mouth 2 (two) times daily as needed (constipation).  01/10/14  Yes Kirstin Shepperson, PA-C  gemfibrozil (LOPID) 600 MG tablet Take 600 mg by mouth 2 (two) times daily.    Yes Historical Provider, MD  glipiZIDE (GLUCOTROL) 10 MG tablet Take 10 mg by mouth 2 (two) times daily before a meal.   Yes Historical Provider, MD  HYDROcodone-acetaminophen (NORCO) 5-325 MG per tablet Take 1-2 tablets by mouth every 6 (six) hours as needed for moderate pain. 11/06/14  Yes Brittney Claiborne Billings, PA-C  meloxicam (MOBIC) 15 MG tablet Take 15 mg by mouth daily.   Yes Historical Provider, MD  metformin (FORTAMET) 1000 MG (OSM) 24 hr tablet Take 1,000 mg by mouth daily with breakfast.   Yes Historical Provider, MD  methocarbamol (ROBAXIN) 500 MG tablet Take 1 tablet (500 mg total) by mouth 4 (four) times daily. 11/06/14  Yes Brittney Kelly, PA-C  Multiple Vitamins-Minerals (VITRUM 50+ SENIOR MULTI PO) Take 1 tablet by mouth daily.    Yes Historical Provider, MD  Omega 3 1000 MG CAPS Take 1,000 mg by mouth daily.   Yes Historical Provider, MD  ondansetron (ZOFRAN) 4 MG tablet Take 1 tablet (4 mg total) by mouth every 8 (eight) hours as needed for nausea or vomiting. 11/06/14  Yes Brittney Kelly, PA-C  polyethylene glycol (MIRALAX / GLYCOLAX) packet Take 17 g by mouth 2 (two) times daily. Patient taking differently: Take 17 g by mouth daily as needed for mild constipation.  01/10/14  Yes Kirstin Shepperson, PA-C  rOPINIRole (REQUIP) 2 MG tablet Take 2 mg by mouth at bedtime.   Yes Historical Provider, MD  Tamsulosin HCl (FLOMAX) 0.4 MG CAPS Take 0.4 mg by mouth at bedtime.    Yes Historical Provider, MD   BP 105/49 mmHg  Pulse 100  Temp(Src) 98.1 F (36.7 C) (Oral)  Resp 17  SpO2 90% Physical Exam  Constitutional: He is oriented to person, place, and time. He appears well-developed and well-nourished.  HENT:  Head: Normocephalic and atraumatic.  Cardiovascular: Normal  rate and regular rhythm.   No murmur heard. Pulmonary/Chest: Effort normal and breath sounds normal. No respiratory distress.  Abdominal: Soft. There is no tenderness. There is no rebound and no guarding.  Musculoskeletal:  Moderate local edema over the right lateral hip. Surgical dressing is clean dry and intact. There is mild to  moderate tenderness over the right anterior lateral hip. 2+ DP pulses bilaterally. Right lower extremity is shortened compared to the left lower extremity.  Neurological: He is alert and oriented to person, place, and time.  Sensation intact in BLE  Skin: Skin is warm and dry.  Psychiatric: He has a normal mood and affect. His behavior is normal.  Nursing note and vitals reviewed.   ED Course  Procedures (including critical care time) Labs Review Labs Reviewed  BASIC METABOLIC PANEL - Abnormal; Notable for the following:    Sodium 133 (*)    Chloride 98 (*)    Glucose, Bld 308 (*)    BUN 38 (*)    Creatinine, Ser 1.55 (*)    GFR calc non Af Amer 43 (*)    GFR calc Af Amer 50 (*)    All other components within normal limits  CBC WITH DIFFERENTIAL/PLATELET - Abnormal; Notable for the following:    HCT 37.8 (*)    All other components within normal limits    Imaging Review Dg Chest Port 1 View  11/09/2014   CLINICAL DATA:  Preop for right hip dislocation.  EXAM: PORTABLE CHEST - 1 VIEW  COMPARISON:  08/13/2014  FINDINGS: The cardiac silhouette, mediastinal and hilar contours are within normal limits and stable given the AP projection of the film. The lungs are clear. No large pleural effusion. The bony thorax is intact.  IMPRESSION: No acute cardiopulmonary findings.   Electronically Signed   By: Marijo Sanes M.D.   On: 11/09/2014 10:29   Dg Hip Unilat With Pelvis 2-3 Views Right  11/09/2014   CLINICAL DATA:  Slipped this morning and fell onto RIGHT hip, RIGHT antral lateral hip pain with limited range of motion, RIGHT hip arthroplasty 3 days ago ; initial  encounter  EXAM: DG HIP (WITH OR WITHOUT PELVIS) 2-3V RIGHT  COMPARISON:  None  FINDINGS: Osseous mineralization grossly normal for technique.  Post RIGHT total hip arthroplasty with superior dislocation of RIGHT femoral component.  No fracture or bone destruction.  Prior L4-L5 fusion.  Acute PE hardware appears intact without surrounding lucency.  IMPRESSION: Superior dislocation of RIGHT hip prosthesis.   Electronically Signed   By: Lavonia Dana M.D.   On: 11/09/2014 09:15   I, Yuepheng Schaller, personally reviewed and evaluated these images and lab results as part of my medical decision-making.   EKG Interpretation   Date/Time:  Friday November 09 2014 09:54:07 EDT Ventricular Rate:  84 PR Interval:  199 QRS Duration: 104 QT Interval:  378 QTC Calculation: 447 R Axis:   -17 Text Interpretation:  Sinus rhythm Ventricular premature complex Consider  left atrial enlargement Borderline left axis deviation Baseline wander in  lead(s) I III aVL Confirmed by Hazle Coca [phone]) on 11/09/2014 10:05:57 AM      MDM   Final diagnoses:  Hip dislocation, right, initial encounter    Patient here status post right hip replacement following a fall. He has a dislocation of the right hip. He is neurovascularly intact on examination. Discussed with Dr. Percell Miller who plans to admit the patient to the OR for reduction under anesthesia. Updated patient regarding need for admission for further treatment.    Quintella Reichert, MD 11/09/14 [phone]

## 2014-11-09 NOTE — Evaluation (Signed)
Physical Therapy Evaluation Patient Details Name: [name] MRN: [id number] DOB: 30-Aug-1940 Today's Date: 11/09/2014   History of Present Illness  74 y.o. s/p R direct anterior THA on 11/06/14 then hip dislocation upon standing up and leg slipped out from  under him yesterday.  Now s/p closed reduction.  Clinical Impression  Patient presents with decreased independence s/p above and will benefit from skilled PT in the acute setting to allow return home with wife assist and HHPT.  Needs one more session to reinforce precautions.    Follow Up Recommendations Home health PT    Equipment Recommendations  None recommended by PT    Recommendations for Other Services       Precautions / Restrictions Precautions Precautions: Fall;Anterior Hip Precaution Booklet Issued: No Precaution Comments: Needs further education      Mobility  Bed Mobility Overal bed mobility: Needs Assistance Bed Mobility: Supine to Sit     Supine to sit: Min assist;HOB elevated     General bed mobility comments: for right LE and scooting  Transfers Overall transfer level: Needs assistance Equipment used: Rolling walker (2 wheeled) Transfers: Sit to/from Stand Sit to Stand: Min assist         General transfer comment: increased time, able to stand without excessive anterior lean  Ambulation/Gait Ambulation/Gait assistance: Supervision Ambulation Distance (Feet): 130 Feet Assistive device: Rolling walker (2 wheeled) Gait Pattern/deviations: Step-through pattern;Antalgic;Decreased step length - left     General Gait Details: limited distance to allow patient to ease back into activity.  Felt he had overdone it the day prior to getting up and hip dislocating  Stairs            Wheelchair Mobility    Modified Rankin (Stroke Patients Only)       Balance Overall balance assessment: Needs assistance           Standing balance-Leahy Scale: Fair Standing balance comment:  supervision standing to urinate and wash hands without UE support                             Pertinent Vitals/Pain Pain Score: 2  Pain Location: right hip  Pain Descriptors / Indicators: Aching Pain Intervention(s): Limited activity within patient's tolerance;Monitored during session;Repositioned    Home Living Family/patient expects to be discharged to:: Private residence Living Arrangements: Spouse/significant other Available Help at Discharge: Family;Available 24 hours/day Type of Home: House Home Access: Ramped entrance     Home Layout: One level Home Equipment: Toilet riser;Shower seat;Adaptive equipment;Walker - standard;Cane - single point;Bedside commode      Prior Function Level of Independence: Needs assistance   Gait / Transfers Assistance Needed: using walker and wife supervision prior to this admission           Hand Dominance        Extremity/Trunk Assessment                 RLE Deficits / Details: AROM limited hip flexion in supine to about 50, strength at least 3/5       Communication   Communication: No difficulties  Cognition Arousal/Alertness: Awake/alert Behavior During Therapy: WFL for tasks assessed/performed Overall Cognitive Status: Within Functional Limits for tasks assessed                      General Comments      Exercises        Assessment/Plan  PT Assessment Patient needs continued PT services  PT Diagnosis Difficulty walking;Acute pain   PT Problem List Decreased strength;Decreased knowledge of precautions;Decreased mobility;Decreased range of motion;Pain;Decreased activity tolerance  PT Treatment Interventions DME instruction;Balance training;Gait training;Functional mobility training;Patient/family education;Therapeutic activities;Therapeutic exercise   PT Goals (Current goals can be found in the Care Plan section) Acute Rehab PT Goals Patient Stated Goal: Go home PT Goal Formulation: With  patient Time For Goal Achievement: 11/10/14 Potential to Achieve Goals: Good    Frequency Min 6X/week   Barriers to discharge        Co-evaluation               End of Session Equipment Utilized During Treatment: Gait belt Activity Tolerance: Patient tolerated treatment well Patient left: with call bell/phone within reach;in chair           Time: 8366-2947 PT Time Calculation (min) (ACUTE ONLY): 23 min   Charges:   PT Evaluation $Initial PT Evaluation Tier I: 1 Procedure PT Treatments $Gait Training: 8-22 mins   PT G Codes:        WYNN,CYNDI 28-Nov-2014, 4:45 PM  Magda Kiel, Olivia 11-28-2014

## 2014-11-09 NOTE — Transfer of Care (Signed)
Immediate Anesthesia Transfer of Care Note  Patient: [name]  Procedure(s) Performed: Procedure(s): CLOSED REDUCTION HIP, s/p total hip 8/9 (Right)  Patient Location: PACU  Anesthesia Type:General  Level of Consciousness: awake and alert   Airway & Oxygen Therapy: Patient Spontanous Breathing and Patient connected to nasal cannula oxygen  Post-op Assessment: Report given to RN, Post -op Vital signs reviewed and stable and Patient moving all extremities X 4  Post vital signs: Reviewed and stable  Last Vitals:  Filed Vitals:   11/09/14 1100  BP: 105/49  Pulse: 100  Temp:   Resp: 17    Complications: No apparent anesthesia complications

## 2014-11-09 NOTE — Anesthesia Postprocedure Evaluation (Signed)
  Anesthesia Post-op Note  Patient: [name]  Procedure(s) Performed: Procedure(s): CLOSED REDUCTION HIP, s/p total hip 8/9 (Right)  Patient Location: PACU  Anesthesia Type:General  Level of Consciousness: awake, alert , oriented and patient cooperative  Airway and Oxygen Therapy: Patient Spontanous Breathing  Post-op Pain: none  Post-op Assessment: Post-op Vital signs reviewed, Patient's Cardiovascular Status Stable, Respiratory Function Stable, Patent Airway, No signs of Nausea or vomiting and Pain level controlled              Post-op Vital Signs: Reviewed and stable  Last Vitals:  Filed Vitals:   11/09/14 1322  BP: 120/63  Pulse: 88  Temp:   Resp:     Complications: No apparent anesthesia complications

## 2014-11-09 NOTE — Anesthesia Procedure Notes (Signed)
Procedure Name: Intubation Date/Time: 11/09/2014 12:36 PM Performed by: Neldon Newport Pre-anesthesia Checklist: Patient identified, Emergency Drugs available, Suction available, Patient being monitored and Timeout performed Patient Re-evaluated:Patient Re-evaluated prior to inductionOxygen Delivery Method: Circle system utilized and Simple face mask Preoxygenation: Pre-oxygenation with 100% oxygen Intubation Type: IV induction Ventilation: Mask ventilation without difficulty Laryngoscope Size: Miller and 3 Grade View: Grade I Tube type: Oral Tube size: 7.5 mm Number of attempts: 1 Airway Equipment and Method: Patient positioned with wedge pillow and Stylet Placement Confirmation: ETT inserted through vocal cords under direct vision,  positive ETCO2 and breath sounds checked- equal and bilateral Secured at: 23 cm Tube secured with: Tape Dental Injury: Teeth and Oropharynx as per pre-operative assessment

## 2014-11-09 NOTE — Consult Note (Signed)
ORTHOPAEDIC CONSULTATION  REQUESTING PHYSICIAN: Quintella Reichert, MD  Chief Complaint: R hip dislocation   HPI: [name] is a 74 y.o. male who complains of R hip dislocation after a mechanical fall today at home.  Patient recently had RTHA on 11/06/2014 and was discharged to home Wednesday. Patient reports starting to stand from a chair this morning when his foot slipped and he felt the hip "pop".  He was unable to ambulate due to pain there after.  The patient has been taking ASA 373m daily as prescribed for DVT prophylaxis.    Past Medical History  Diagnosis Date  . Kidney stones   . Hypertension   . Diabetes mellitus type 2 in obese   . PONV (postoperative nausea and vomiting)   . GERD (gastroesophageal reflux disease)     occ indigestion  . Arthritis     arthirtis all over  . Neuromuscular disorder     restless leg syndrome  . Right knee DJD   . Hyperlipemia   . Frequent urination at night   . Primary osteoarthritis of right knee   . Primary localized osteoarthritis of right hip   . Spinal stenosis of lumbar region with radiculopathy   . PONV (postoperative nausea and vomiting)   . Glaucoma     both eyes   Past Surgical History  Procedure Laterality Date  . Lithotripsy    . Shoulder surgery      right  . Elbow surgery      right  . Abdominal hernia repair    . Total knee arthroplasty Left 07/2006    left knee  . Hernia repair  1980    inguinal  . Shoulder arthroscopy  1980    right  . Elbow arthroscopy  1990    right  . Stone extraction with basket  2010  . Joint replacement  2008    knee  . Lumbar disc surgery      2015  . Total knee arthroplasty Right 01/08/2014    Procedure: RIGHT TOTAL KNEE ARTHROPLASTY;  Surgeon: RLorn Junes MD;  Location[facility]  Service: Orthopedics;  Laterality: Right;  . Back surgery    . Total hip arthroplasty Right 11/06/2014    Procedure: RIGHT TOTAL HIP ARTHROPLASTY ANTERIOR APPROACH;  Surgeon: TRenette Butters  MD;  Location[facility]  Service: Orthopedics;  Laterality: Right;   Social History   Social History  . Marital Status: Married    Spouse Name: N/A  . Number of Children: N/A  . Years of Education: N/A   Social History Main Topics  . Smoking status: Never Smoker   . Smokeless tobacco: None  . Alcohol Use: Yes     Comment: glass of wine every 3-4 months  . Drug Use: No  . Sexual Activity: Not Asked   Other Topics Concern  . None   Social History Narrative   Family History  Problem Relation Age of Onset  . Hypertension Mother   . Hypertension Father   . Hypertension Brother   . Kidney disease Mother   . Diabetes Mellitus II Father   . Diabetes Mellitus II Brother   . Diabetes Mellitus II Brother   . Cancer - Prostate Brother    Allergies  Allergen Reactions  . Morphine And Related     Causes nausea & vomiting   Prior to Admission medications   Medication Sig Start Date End Date Taking? Authorizing Provider  allopurinol (ZYLOPRIM) 300 MG  tablet Take 150 mg by mouth every morning.    Yes Historical Provider, MD  amLODipine (NORVASC) 10 MG tablet Take 10 mg by mouth daily.   Yes Historical Provider, MD  aspirin 325 MG tablet Take 1 tablet (325 mg total) by mouth daily. 11/06/14  Yes Fatou Dunnigan, PA-C  atorvastatin (LIPITOR) 10 MG tablet Take 10 mg by mouth at bedtime.   Yes Historical Provider, MD  Cholecalciferol (VITAMIN D-3) 5000 UNITS TABS Take 5,000 Units by mouth daily.   Yes Historical Provider, MD  diazepam (VALIUM) 5 MG tablet Take 1 tablet (5 mg total) by mouth at bedtime as needed. Patient taking differently: Take 5 mg by mouth at bedtime as needed for anxiety or muscle spasms.  11/07/14  Yes Annastacia Duba, PA-C  docusate sodium 100 MG CAPS Take 100 mg by mouth 2 (two) times daily. Patient taking differently: Take 100 mg by mouth 2 (two) times daily as needed (constipation).  01/10/14  Yes Kirstin Shepperson, PA-C  gemfibrozil (LOPID) 600 MG tablet Take 600 mg  by mouth 2 (two) times daily.    Yes Historical Provider, MD  glipiZIDE (GLUCOTROL) 10 MG tablet Take 10 mg by mouth 2 (two) times daily before a meal.   Yes Historical Provider, MD  HYDROcodone-acetaminophen (NORCO) 5-325 MG per tablet Take 1-2 tablets by mouth every 6 (six) hours as needed for moderate pain. 11/06/14  Yes Ardyth Kelso Claiborne Billings, PA-C  meloxicam (MOBIC) 15 MG tablet Take 15 mg by mouth daily.   Yes Historical Provider, MD  metformin (FORTAMET) 1000 MG (OSM) 24 hr tablet Take 1,000 mg by mouth daily with breakfast.   Yes Historical Provider, MD  methocarbamol (ROBAXIN) 500 MG tablet Take 1 tablet (500 mg total) by mouth 4 (four) times daily. 11/06/14  Yes Latreece Mochizuki, PA-C  Multiple Vitamins-Minerals (VITRUM 50+ SENIOR MULTI PO) Take 1 tablet by mouth daily.    Yes Historical Provider, MD  Omega 3 1000 MG CAPS Take 1,000 mg by mouth daily.   Yes Historical Provider, MD  ondansetron (ZOFRAN) 4 MG tablet Take 1 tablet (4 mg total) by mouth every 8 (eight) hours as needed for nausea or vomiting. 11/06/14  Yes Rondi Ivy, PA-C  polyethylene glycol (MIRALAX / GLYCOLAX) packet Take 17 g by mouth 2 (two) times daily. Patient taking differently: Take 17 g by mouth daily as needed for mild constipation.  01/10/14  Yes Kirstin Shepperson, PA-C  rOPINIRole (REQUIP) 2 MG tablet Take 2 mg by mouth at bedtime.   Yes Historical Provider, MD  Tamsulosin HCl (FLOMAX) 0.4 MG CAPS Take 0.4 mg by mouth at bedtime.    Yes Historical Provider, MD   Dg Hip Unilat With Pelvis 2-3 Views Right  11/09/2014   CLINICAL DATA:  Slipped this morning and fell onto RIGHT hip, RIGHT antral lateral hip pain with limited range of motion, RIGHT hip arthroplasty 3 days ago ; initial encounter  EXAM: DG HIP (WITH OR WITHOUT PELVIS) 2-3V RIGHT  COMPARISON:  None  FINDINGS: Osseous mineralization grossly normal for technique.  Post RIGHT total hip arthroplasty with superior dislocation of RIGHT femoral component.  No fracture or  bone destruction.  Prior L4-L5 fusion.  Acute PE hardware appears intact without surrounding lucency.  IMPRESSION: Superior dislocation of RIGHT hip prosthesis.   Electronically Signed   By: Lavonia Dana M.D.   On: 11/09/2014 09:15    Positive ROS: All other systems have been reviewed and were otherwise negative with the exception of those mentioned in  the HPI and as above.  Labs cbc  Recent Labs  11/09/14 0835  WBC 9.0  HGB 13.2  HCT 37.8*  PLT 225    Labs inflam No results for input(s): CRP in the last 72 hours.  Invalid input(s): ESR  Labs coag No results for input(s): INR, PTT in the last 72 hours.  Invalid input(s): PT  No results for input(s): NA, K, CL, CO2, GLUCOSE, BUN, CREATININE, CALCIUM in the last 72 hours.  Physical Exam: Filed Vitals:   11/09/14 0819  BP: 134/71  Pulse: 85  Temp: 98.1 F (36.7 C)  Resp: 18   General: Alert, no acute distress Cardiovascular: No pedal edema Respiratory: No cyanosis, no use of accessory musculature GI: No organomegaly, abdomen is soft and non-tender Skin: No lesions in the area of chief complaint other than those listed below in MSK exam.  Neurologic: Sensation intact distally Psychiatric: Patient is competent for consent with normal mood and affect Lymphatic: No axillary or cervical lymphadenopathy  MUSCULOSKELETAL:  R hip has no erythema or warmth to the touch. Incision to the anterior hip is covered in the dressing from OR 11/06/2014. It is very painful to the touch and with any motion of the hip.  Sensation intact with 2+ distal pulses.   Other extremities are atraumatic with painless ROM and NVI.  Assessment: R hip dislocation s/p mechanical fall.  RTHA on 11/06/14.    Plan: Plan to take to the OR today for closed reduction of R hip. Weight Bearing Status: bedrest  [facility] [phone]   11/09/2014 10:13 AM

## 2014-11-09 NOTE — Discharge Summary (Signed)
Patient ID: [name] MRN: [id number] DOB/AGE: [birth year] 74 y.o.  Admit date: 11/09/2014 Discharge date: 11/10/2014  Admission Diagnoses:  Active Problems:   Dislocation, hip   Discharge Diagnoses:  Same  Past Medical History  Diagnosis Date  . Kidney stones   . Hypertension   . Diabetes mellitus type 2 in obese   . PONV (postoperative nausea and vomiting)   . GERD (gastroesophageal reflux disease)     occ indigestion  . Arthritis     arthirtis all over  . Neuromuscular disorder     restless leg syndrome  . Right knee DJD   . Hyperlipemia   . Frequent urination at night   . Primary osteoarthritis of right knee   . Primary localized osteoarthritis of right hip   . Spinal stenosis of lumbar region with radiculopathy   . PONV (postoperative nausea and vomiting)   . Glaucoma     both eyes    Surgeries: Procedure(s): CLOSED REDUCTION HIP, s/p total hip 8/9 on 11/09/2014   Consultants:    Discharged Condition: Improved  Hospital Course: [name] is an 74 y.o. male who was admitted 11/09/2014 for operative treatment of anterior dislocation right hip following right direct anterior total hip replacement. Patient has severe unremitting pain that affects sleep, daily activities, and work/hobbies. After pre-op clearance the patient was taken to the operating room on 11/09/2014 and underwent  Procedure(s): CLOSED REDUCTION HIP, s/p total hip 8/9.    Patient was given perioperative antibiotics:      Anti-infectives    Start     Dose/Rate Route Frequency Ordered Stop   11/09/14 1200  ceFAZolin (ANCEF) IVPB 2 g/50 mL premix  Status:  Discontinued     2 g 100 mL/hr over 30 Minutes Intravenous On call to O.R. 11/09/14 1146 11/09/14 1414       Patient was given sequential compression devices, early ambulation, and chemoprophylaxis to prevent DVT.  Patient benefited maximally from hospital stay and there were no complications.    Recent vital signs:  Patient  Vitals for the past 24 hrs:  BP Temp Temp src Pulse Resp SpO2  11/10/14 0549 128/82 mmHg 98.2 F (36.8 C) Oral 86 16 92 %  11/10/14 0028 131/74 mmHg 97.9 F (36.6 C) Oral 86 16 92 %  11/09/14 1947 128/73 mmHg 99.3 F (37.4 C) Oral 92 16 95 %  11/09/14 1417 116/87 mmHg 98.2 F (36.8 C) - 89 18 95 %  11/09/14 1322 120/63 mmHg - - 88 - 92 %  11/09/14 1307 - - - - - 94 %  11/09/14 1301 121/75 mmHg 97.7 F (36.5 C) - 97 - -  11/09/14 1100 (!) 105/49 mmHg - - 100 17 90 %  11/09/14 1045 (!) 109/52 mmHg - - 91 15 91 %  11/09/14 1030 110/55 mmHg - - 88 12 90 %  11/09/14 0945 130/55 mmHg - - 94 - 93 %  11/09/14 0830 114/68 mmHg - - 89 - (!) 88 %  11/09/14 0819 134/71 mmHg 98.1 F (36.7 C) Oral 85 18 95 %     Recent laboratory studies:   Recent Labs  11/09/14 0835  WBC 9.0  HGB 13.2  HCT 37.8*  PLT 225  NA 133*  K 3.8  CL 98*  CO2 24  BUN 38*  CREATININE 1.55*  GLUCOSE 308*  CALCIUM 9.6     Discharge Medications:     Medication List    STOP taking these medications  meloxicam 15 MG tablet  Commonly known as:  MOBIC     Omega 3 1000 MG Caps      TAKE these medications        allopurinol 300 MG tablet  Commonly known as:  ZYLOPRIM  Take 150 mg by mouth every morning.     amLODipine 10 MG tablet  Commonly known as:  NORVASC  Take 10 mg by mouth daily.     aspirin 325 MG tablet  Take 1 tablet (325 mg total) by mouth daily.     atorvastatin 10 MG tablet  Commonly known as:  LIPITOR  Take 10 mg by mouth at bedtime.     diazepam 5 MG tablet  Commonly known as:  VALIUM  Take 1 tablet (5 mg total) by mouth at bedtime as needed.     DSS 100 MG Caps  Take 100 mg by mouth 2 (two) times daily.     gemfibrozil 600 MG tablet  Commonly known as:  LOPID  Take 600 mg by mouth 2 (two) times daily.     glipiZIDE 10 MG tablet  Commonly known as:  GLUCOTROL  Take 10 mg by mouth 2 (two) times daily before a meal.     HYDROcodone-acetaminophen 5-325 MG per  tablet  Commonly known as:  NORCO  Take 1-2 tablets by mouth every 6 (six) hours as needed for moderate pain.     metformin 1000 MG (OSM) 24 hr tablet  Commonly known as:  FORTAMET  Take 1,000 mg by mouth daily with breakfast.     methocarbamol 500 MG tablet  Commonly known as:  ROBAXIN  Take 1 tablet (500 mg total) by mouth 4 (four) times daily.     ondansetron 4 MG tablet  Commonly known as:  ZOFRAN  Take 1 tablet (4 mg total) by mouth every 8 (eight) hours as needed for nausea or vomiting.     polyethylene glycol packet  Commonly known as:  MIRALAX / GLYCOLAX  Take 17 g by mouth 2 (two) times daily.     rOPINIRole 2 MG tablet  Commonly known as:  REQUIP  Take 2 mg by mouth at bedtime.     tamsulosin 0.4 MG Caps capsule  Commonly known as:  FLOMAX  Take 0.4 mg by mouth at bedtime.     Vitamin D-3 5000 UNITS Tabs  Take 5,000 Units by mouth daily.     VITRUM 50+ SENIOR MULTI PO  Take 1 tablet by mouth daily.        Diagnostic Studies: Dg Pelvis Portable  11/06/2014   CLINICAL DATA:  Status post right hip arthroplasty.  EXAM: PORTABLE PELVIS 1-2 VIEWS  COMPARISON:  None.  FINDINGS: On the single AP view, the right hip arthroplasty components are well-seated and aligned. There is no acute fracture or evidence of an operative complication.  IMPRESSION: Well-aligned right hip total arthroplasty.   Electronically Signed   By: Lajean Manes M.D.   On: 11/06/2014 16:40   Dg Chest Port 1 View  11/09/2014   CLINICAL DATA:  Preop for right hip dislocation.  EXAM: PORTABLE CHEST - 1 VIEW  COMPARISON:  08/13/2014  FINDINGS: The cardiac silhouette, mediastinal and hilar contours are within normal limits and stable given the AP projection of the film. The lungs are clear. No large pleural effusion. The bony thorax is intact.  IMPRESSION: No acute cardiopulmonary findings.   Electronically Signed   By: Marijo Sanes M.D.   On: 11/09/2014 10:29  Dg Hip Operative Unilat With Pelvis  Right  11/09/2014   CLINICAL DATA:  Status post close reduction with internal fixation  EXAM: OPERATIVE right HIP (WITH PELVIS IF PERFORMED) 2 VIEWS  TECHNIQUE: Fluoroscopic spot image(s) were submitted for interpretation post-operatively.  FLUOROSCOPY TIME:  18 seconds  Two images  COMPARISON:  None.  FINDINGS: Interval relocation of right hip dislocation. Right total hip arthroplasty. No fracture or dislocation. No failure or complication.  IMPRESSION: Successful reduction of right hip dislocation.   Electronically Signed   By: Kathreen Devoid   On: 11/09/2014 13:19   Dg Hip Unilat With Pelvis 2-3 Views Right  11/09/2014   CLINICAL DATA:  Slipped this morning and fell onto RIGHT hip, RIGHT antral lateral hip pain with limited range of motion, RIGHT hip arthroplasty 3 days ago ; initial encounter  EXAM: DG HIP (WITH OR WITHOUT PELVIS) 2-3V RIGHT  COMPARISON:  None  FINDINGS: Osseous mineralization grossly normal for technique.  Post RIGHT total hip arthroplasty with superior dislocation of RIGHT femoral component.  No fracture or bone destruction.  Prior L4-L5 fusion.  Acute PE hardware appears intact without surrounding lucency.  IMPRESSION: Superior dislocation of RIGHT hip prosthesis.   Electronically Signed   By: Lavonia Dana M.D.   On: 11/09/2014 09:15    Disposition: 01-Home or Self Care       Signed: Fannie Knee 11/10/2014, 7:56 AM

## 2014-11-09 NOTE — Discharge Instructions (Signed)
INSTRUCTIONS AFTER JOINT REPLACEMENT   o Remove items at home which could result in a fall. This includes throw rugs or furniture in walking pathways o ICE to the affected joint every three hours while awake for 30 minutes at a time, for at least the first 3-5 days, and then as needed for pain and swelling.  Continue to use ice for pain and swelling. You may notice swelling that will progress down to the foot and ankle.  This is normal after surgery.  Elevate your leg when you are not up walking on it.   o Continue to use the breathing machine you got in the hospital (incentive spirometer) which will help keep your temperature down.  It is common for your temperature to cycle up and down following surgery, especially at night when you are not up moving around and exerting yourself.  The breathing machine keeps your lungs expanded and your temperature down.  Continue current plans and instructions as you were following last week after your total hip replacement.  DIET:  As you were doing prior to hospitalization, we recommend a well-balanced diet.  DRESSING / WOUND CARE / SHOWERING  You may change your dressing 3-5 days after surgery.  Then change the dressing every day with sterile gauze.  Please use good hand washing techniques before changing the dressing.  Do not use any lotions or creams on the incision until instructed by your surgeon. and You may shower 3 days after surgery, but keep the wounds dry during showering.  You may use an occlusive plastic wrap (Press'n Seal for example), NO SOAKING/SUBMERGING IN THE BATHTUB.  If the bandage gets wet, change with a clean dry gauze.  If the incision gets wet, pat the wound dry with a clean towel.    WEIGHT BEARING   Weight bearing as tolerated with assist device (walker, cane, etc) as directed, use it as long as suggested by your surgeon or therapist, typically at least 4-6 weeks.      Drink plenty of fluids (prune juice may be helpful) and high  fiber foods Colace 100 mg by mouth twice a day  Senokot for constipation as directed and as needed Dulcolax (bisacodyl), take with full glass of water  Miralax (polyethylene glycol) once or twice a day as needed.  If you have tried all these things and are unable to have a bowel movement in the first 3-4 days after surgery call either your surgeon or your primary doctor.    If you experience loose stools or diarrhea, hold the medications until you stool forms back up.  If your symptoms do not get better within 1 week or if they get worse, check with your doctor.  If you experience "the worst abdominal pain ever" or develop nausea or vomiting, please contact the office immediately for further recommendations for treatment.   ITCHING:  If you experience itching with your medications, try taking only a single pain pill, or even half a pain pill at a time.  You can also use Benadryl over the counter for itching or also to help with sleep.   TED HOSE STOCKINGS:  Use stockings on both legs until for at least 2 weeks or as directed by physician office. They may be removed at night for sleeping.  MEDICATIONS:  See your medication summary on the After Visit Summary that nursing will review with you.  You may have some home medications which will be placed on hold until you complete the course of  blood thinner medication.  It is important for you to complete the blood thinner medication as prescribed.  PRECAUTIONS:  If you experience chest pain or shortness of breath - call 911 immediately for transfer to the hospital emergency department.   If you develop a fever greater that 101 F, purulent drainage from wound, increased redness or drainage from wound, foul odor from the wound/dressing, or calf pain - CONTACT YOUR SURGEON.                                                   FOLLOW-UP APPOINTMENTS:  If you do not already have a post-op appointment, please call the office for an appointment to be seen by  your surgeon.  Guidelines for how soon to be seen are listed in your After Visit Summary, but are typically between 1-4 weeks after surgery.  OTHER INSTRUCTIONS:   Knee Replacement:  Do not place pillow under knee, focus on keeping the knee straight while resting. CPM instructions: 0-90 degrees, 2 hours in the morning, 2 hours in the afternoon, and 2 hours in the evening. Place foam block, curve side up under heel at all times except when in CPM or when walking.  DO NOT modify, tear, cut, or change the foam block in any way.  MAKE SURE YOU:   Understand these instructions.   Get help right away if you are not doing well or get worse.    Thank you for letting us be a part of your medical care team.  It is a privilege we respect greatly.  We hope these instructions will help you stay on track for a fast and full recovery!

## 2014-11-10 LAB — GLUCOSE, CAPILLARY
Glucose-Capillary: 347 mg/dL — ABNORMAL HIGH (ref 65–99)
Glucose-Capillary: 276 mg/dL — ABNORMAL HIGH (ref 65–99)

## 2014-11-10 NOTE — Progress Notes (Signed)
Subjective: 1 Day Post-Op Procedure(s) (LRB): CLOSED REDUCTION HIP, s/p total hip 8/9 (Right) Patient reports pain as mild.  No nausea/vomiting, lightheadedness/dizziness, chest pain/sob.  Tolerating diet.  Objective: Vital signs in last 24 hours: Temp:  [97.7 F (36.5 C)-99.3 F (37.4 C)] 98.2 F (36.8 C) (08/13 0549) Pulse Rate:  [85-100] 86 (08/13 0549) Resp:  [12-18] 16 (08/13 0549) BP: (105-134)/(49-87) 128/82 mmHg (08/13 0549) SpO2:  [88 %-95 %] 92 % (08/13 0549)  Intake/Output from previous day: 08/12 0701 - 08/13 0700 In: 500 [I.V.:500] Out: 400 [Urine:400] Intake/Output this shift:     Recent Labs  11/09/14 0835  HGB 13.2    Recent Labs  11/09/14 0835  WBC 9.0  RBC 4.26  HCT 37.8*  PLT 225    Recent Labs  11/09/14 0835  NA 133*  K 3.8  CL 98*  CO2 24  BUN 38*  CREATININE 1.55*  GLUCOSE 308*  CALCIUM 9.6   No results for input(s): LABPT, INR in the last 72 hours.  Neurologically intact Neurovascular intact Sensation intact distally Intact pulses distally Dorsiflexion/Plantar flexion intact Compartment soft  Negative homans bilaterally  Assessment/Plan: 1 Day Post-Op Procedure(s) (LRB): CLOSED REDUCTION HIP, s/p total hip 8/9 (Right) Advance diet Up with therapy D/C IV fluids  D/C home this am after PT session WBAT RLE-anterior hip precautions Dry dressing change prn  Fannie Knee 11/10/2014, 8:00 AM

## 2014-11-10 NOTE — Care Management Note (Signed)
Case Management Note  Patient Details  Name: [name] MRN: [id number] Date of Birth: 08/20/1940  Subjective/Objective:                    Action/Plan:   Expected Discharge Date:                  Expected Discharge Plan:     In-House Referral:     Discharge planning Services     Post Acute Care Choice:    Choice offered to:     DME Arranged:    DME Agency:     HH Arranged:    [place] Agency:     Status of Service:     Medicare Important Message Given: yes   Date Medicare IM Given:  11/10/14  Medicare IM give by:   Frann Rider, RN, BSN Date Additional Medicare IM Given:    Additional Medicare Important Message give by:     If discussed at [place] of Stay Meetings, dates discussed:    Additional Comments:  Norina Buzzard, RN 11/10/2014, 9:12 AM

## 2014-11-10 NOTE — Op Note (Signed)
NAMEKEVAUGHN, EWING NO.:  [id number]  MEDICAL RECORD NO.:  [id number]  LOCATION:  [id number]                        FACILITY:  [facility]  PHYSICIAN:  Ninetta Lights, M.D. DATE OF BIRTH:  1940/06/14  DATE OF PROCEDURE:  11/09/2014 DATE OF DISCHARGE:                              OPERATIVE REPORT   PREOPERATIVE DIAGNOSIS:  Traumatic anterosuperior dislocation, right total hip arthroplasty.  POSTOPERATIVE DIAGNOSIS:  Traumatic anterosuperior dislocation, right total hip arthroplasty with no evidence of periprosthetic fracture.  PROCEDURE:  Closed reduction of right hip under general anesthesia, muscle paralysis, and fluoroscopic guidance.  SURGEON:  Ninetta Lights, M.D.  ANESTHESIA:  General.  PROCEDURE IN DETAIL:  The patient was brought to the operating room and after we got an adequate anesthesia and muscle relaxation, he was transferred to an OR table so I could do a fluoroscopic guidance.  I was able to reduce his hip with a moderate amount of effort with longitudinal traction, adduction, and then eventually external rotation. Confirmed a nice stable congruent reduction fluoroscopically at completion.  I could bring his hip through great motion and there was no feeling of instability at all.  Tolerated this well.  Anesthesia reversed.  Brought to the recovery room.     Ninetta Lights, M.D.     DFM/MEDQ  D:  11/09/2014  T:  11/10/2014  Job:  [id number]

## 2014-11-10 NOTE — Progress Notes (Signed)
Discharge instructions gave to pt and all questions answered. Pt is ready to discharge. 

## 2014-11-10 NOTE — Progress Notes (Signed)
Physical Therapy Treatment Patient Details Name: [name] MRN: [id number] DOB: 1941-02-23 Today's Date: 11/10/2014    History of Present Illness 74 y.o. s/p R direct anterior THA on 11/06/14 then hip dislocation upon standing up and leg slipped out from  under him yesterday.  Now s/p closed reduction.    PT Comments    Pt progressing towards physical therapy goals. Was able to perform transfers and ambulation with min guard to supervision for safety. No real physical assist required. Reviewed general safety precautions for home, and recommended that pt continue RW use, wear shoes or grip socks around the house, and ask wife for supervision if he feels unsteady or weak. Will continue to follow and progress as able per POC.   Follow Up Recommendations  Home health PT     Equipment Recommendations  None recommended by PT    Recommendations for Other Services       Precautions / Restrictions Precautions Precautions: Fall;Anterior Hip Precaution Booklet Issued: Yes (comment) Precaution Comments: Issued pt handout and used teach-back to review anterior precautions. Pt continues to require cueing to recall 3/3.  Restrictions Weight Bearing Restrictions: No    Mobility  Bed Mobility Overal bed mobility: Needs Assistance Bed Mobility: Supine to Sit     Supine to sit: Min guard     General bed mobility comments: Pt was able to transition to EOB with close guard for trunk elevation as pt reached full sitting position.   Transfers Overall transfer level: Needs assistance Equipment used: Rolling walker (2 wheeled) Transfers: Sit to/from Stand Sit to Stand: Min guard         General transfer comment: No physical assist required. Close guard for safety. Pt was cued for general safety awareness but demonstrated proper hand placement on seated surface.   Ambulation/Gait Ambulation/Gait assistance: Supervision Ambulation Distance (Feet): 300 Feet Assistive device: Rolling  walker (2 wheeled) Gait Pattern/deviations: Step-through pattern;Decreased stride length;Trunk flexed Gait velocity: Decreased Gait velocity interpretation: Below normal speed for age/gender General Gait Details: Pt was able to ambulate fairly well in the hall. Supervision for safety. VC's for improved posture and increased heel strike.    Stairs            Wheelchair Mobility    Modified Rankin (Stroke Patients Only)       Balance Overall balance assessment: Needs assistance Sitting-balance support: Feet supported;No upper extremity supported Sitting balance-Leahy Scale: Good     Standing balance support: No upper extremity supported Standing balance-Leahy Scale: Fair Standing balance comment: Standing edge of chair before reaching back for arm rests to sit.                     Cognition Arousal/Alertness: Awake/alert Behavior During Therapy: WFL for tasks assessed/performed Overall Cognitive Status: Within Functional Limits for tasks assessed                      Exercises      General Comments General comments (skin integrity, edema, etc.): Reviewed HEP and changes to exercises due to new anterior hip precautions. New exercise handout issued with contraindicated exercises crossed out      Pertinent Vitals/Pain Pain Assessment: No/denies pain    Home Living                      Prior Function            PT Goals (current goals can now be found in  the care plan section) Acute Rehab PT Goals Patient Stated Goal: Go home PT Goal Formulation: With patient Time For Goal Achievement: 11/17/14 Potential to Achieve Goals: Good Progress towards PT goals: Progressing toward goals    Frequency  Min 6X/week    PT Plan Current plan remains appropriate    Co-evaluation             End of Session Equipment Utilized During Treatment: Gait belt Activity Tolerance: Patient tolerated treatment well Patient left: with call bell/phone  within reach;in chair     Time: 3474-2595 PT Time Calculation (min) (ACUTE ONLY): 29 min  Charges:  $Gait Training: 8-22 mins $Therapeutic Activity: 8-22 mins                    G Codes:      Rolinda Roan 12/05/2014, 11:34 AM   Rolinda Roan, PT, DPT Acute Rehabilitation Services Pager: [phone]

## 2014-11-12 ENCOUNTER — Encounter (HOSPITAL_COMMUNITY): Payer: Self-pay | Admitting: Orthopedic Surgery

## 2014-11-12 NOTE — Progress Notes (Signed)
Physical Therapy G-Code Note    Nov 23, 2014 1650  PT G-Codes **NOT FOR INPATIENT CLASS**  Functional Assessment Tool Used clinical judgement  Functional Limitation Mobility: Walking and moving around  Mobility: Walking and Moving Around Current Status [phone]) CI  Mobility: Walking and Moving Around Goal Status [phone]) CI   [place], [place] [phone] 11/12/2014

## 2014-11-28 ENCOUNTER — Emergency Department (HOSPITAL_COMMUNITY): Payer: PPO

## 2014-11-28 ENCOUNTER — Encounter (HOSPITAL_COMMUNITY): Payer: Self-pay

## 2014-11-28 ENCOUNTER — Emergency Department (HOSPITAL_COMMUNITY)
Admission: EM | Admit: 2014-11-28 | Discharge: 2014-11-28 | Disposition: A | Discharge status: Home / Self Care | Payer: PPO | Attending: Emergency Medicine | Admitting: Emergency Medicine

## 2014-11-28 DIAGNOSIS — M1711 Unilateral primary osteoarthritis, right knee: Secondary | ICD-10-CM | POA: Diagnosis not present

## 2014-11-28 DIAGNOSIS — M1611 Unilateral primary osteoarthritis, right hip: Secondary | ICD-10-CM | POA: Diagnosis not present

## 2014-11-28 DIAGNOSIS — I10 Essential (primary) hypertension: Secondary | ICD-10-CM | POA: Diagnosis not present

## 2014-11-28 DIAGNOSIS — Z79899 Other long term (current) drug therapy: Secondary | ICD-10-CM | POA: Insufficient documentation

## 2014-11-28 DIAGNOSIS — Z8719 Personal history of other diseases of the digestive system: Secondary | ICD-10-CM | POA: Diagnosis not present

## 2014-11-28 DIAGNOSIS — X58XXXA Exposure to other specified factors, initial encounter: Secondary | ICD-10-CM | POA: Insufficient documentation

## 2014-11-28 DIAGNOSIS — Y831 Surgical operation with implant of artificial internal device as the cause of abnormal reaction of the patient, or of later complication, without mention of misadventure at the time of the procedure: Secondary | ICD-10-CM | POA: Insufficient documentation

## 2014-11-28 DIAGNOSIS — Y998 Other external cause status: Secondary | ICD-10-CM | POA: Diagnosis not present

## 2014-11-28 DIAGNOSIS — Z7982 Long term (current) use of aspirin: Secondary | ICD-10-CM | POA: Insufficient documentation

## 2014-11-28 DIAGNOSIS — E119 Type 2 diabetes mellitus without complications: Secondary | ICD-10-CM | POA: Insufficient documentation

## 2014-11-28 DIAGNOSIS — Z87442 Personal history of urinary calculi: Secondary | ICD-10-CM | POA: Insufficient documentation

## 2014-11-28 DIAGNOSIS — S73004A Unspecified dislocation of right hip, initial encounter: Secondary | ICD-10-CM

## 2014-11-28 DIAGNOSIS — Y9289 Other specified places as the place of occurrence of the external cause: Secondary | ICD-10-CM | POA: Insufficient documentation

## 2014-11-28 DIAGNOSIS — E669 Obesity, unspecified: Secondary | ICD-10-CM | POA: Diagnosis not present

## 2014-11-28 DIAGNOSIS — T84020A Dislocation of internal right hip prosthesis, initial encounter: Secondary | ICD-10-CM | POA: Insufficient documentation

## 2014-11-28 DIAGNOSIS — Z8669 Personal history of other diseases of the nervous system and sense organs: Secondary | ICD-10-CM | POA: Insufficient documentation

## 2014-11-28 DIAGNOSIS — Y9389 Activity, other specified: Secondary | ICD-10-CM | POA: Diagnosis not present

## 2014-11-28 DIAGNOSIS — IMO0001 Reserved for inherently not codable concepts without codable children: Secondary | ICD-10-CM

## 2014-11-28 DIAGNOSIS — S79911A Unspecified injury of right hip, initial encounter: Secondary | ICD-10-CM | POA: Diagnosis present

## 2014-11-28 DIAGNOSIS — E785 Hyperlipidemia, unspecified: Secondary | ICD-10-CM | POA: Insufficient documentation

## 2014-11-28 LAB — CBC
HCT: 36.7 % — ABNORMAL LOW (ref 39.0–52.0)
Hemoglobin: 12.3 g/dL — ABNORMAL LOW (ref 13.0–17.0)
MCH: 30.4 pg (ref 26.0–34.0)
MCHC: 33.5 g/dL (ref 30.0–36.0)
MCV: 90.8 fL (ref 78.0–100.0)
Platelets: 331 10*3/uL (ref 150–400)
RBC: 4.04 MIL/uL — ABNORMAL LOW (ref 4.22–5.81)
RDW: 14.2 % (ref 11.5–15.5)
WBC: 6.1 10*3/uL (ref 4.0–10.5)

## 2014-11-28 LAB — BASIC METABOLIC PANEL
Anion gap: 11 (ref 5–15)
BUN: 28 mg/dL — ABNORMAL HIGH (ref 6–20)
CO2: 24 mmol/L (ref 22–32)
Calcium: 9.5 mg/dL (ref 8.9–10.3)
Chloride: 103 mmol/L (ref 101–111)
Creatinine, Ser: 1.34 mg/dL — ABNORMAL HIGH (ref 0.61–1.24)
GFR calc Af Amer: 59 mL/min — ABNORMAL LOW (ref >60)
GFR calc non Af Amer: 51 mL/min — ABNORMAL LOW (ref >60)
Glucose, Bld: 209 mg/dL — ABNORMAL HIGH (ref 65–99)
Potassium: 3.8 mmol/L (ref 3.5–5.1)
Sodium: 138 mmol/L (ref 135–145)

## 2014-11-28 MED ORDER — HYDROMORPHONE HCL 1 MG/ML IJ SOLN
1.0000 mg | Freq: Once | INTRAMUSCULAR | Status: AC
Start: 2014-11-28 — End: 2014-11-28
  Administered 2014-11-28: 1 mg via INTRAVENOUS
  Filled 2014-11-28: qty 1

## 2014-11-28 MED ORDER — METHOCARBAMOL 1000 MG/10ML IJ SOLN
1000.0000 mg | Freq: Once | INTRAMUSCULAR | Status: AC
  Administered 2014-11-28: 1000 mg via INTRAMUSCULAR
  Filled 2014-11-28: qty 10

## 2014-11-28 MED ORDER — KETAMINE HCL 10 MG/ML IJ SOLN
1.0000 mg/kg | Freq: Once | INTRAMUSCULAR | Status: AC
  Administered 2014-11-28: 117 mg via INTRAVENOUS
  Filled 2014-11-28: qty 11.7

## 2014-11-28 NOTE — Discharge Instructions (Signed)
Call in the office in the am to schedule recheck early next week.  Brace in place except for showers.  Dr. Percell Miller will call.

## 2014-11-28 NOTE — ED Notes (Signed)
Bed: WA08 Expected date:  Expected time:  Means of arrival:  Comments: EMS- 74yo M, R hip pain, felt a "pop"

## 2014-11-28 NOTE — Progress Notes (Signed)
Second hip dislocation since hip surgery early august recently d/c [facility] Wife at bedside inquired if HHPT available again possible for home safety check and evaluation of what may be done to prevent   CM left a voice message for Cyril Mourning of [facility] to provided a referral for services

## 2014-11-28 NOTE — ED Notes (Signed)
Pt alert x4 respirations easy non labored.  

## 2014-11-28 NOTE — ED Notes (Signed)
Bib ems c/o right hip pain s/p getting out of chair. Pt had recent hip replacement with 2 dislocations after. Shortening and rotation noted. Ppp, cap refill prompt.

## 2014-11-28 NOTE — ED Provider Notes (Signed)
CSN: [id number]     Arrival date & time 11/28/14  1226 History   First MD Initiated Contact with Patient 11/28/14 1251     Chief Complaint  Patient presents with  . Hip Pain     (Consider location/radiation/quality/duration/timing/severity/associated sxs/prior Treatment) HPI Patient presents today complaining of right hip dislocation. He had a right hip replacement done August 9. He had a dislocation on the 12th that required going to the operating room for reduction. Today he states he is in his chair and just moved and felt that his hip had severe pain and then he was unable to move it. He denies any other injuries or complaints today. Past Medical History  Diagnosis Date  . Kidney stones   . Hypertension   . Diabetes mellitus type 2 in obese   . PONV (postoperative nausea and vomiting)   . GERD (gastroesophageal reflux disease)     occ indigestion  . Arthritis     arthirtis all over  . Neuromuscular disorder     restless leg syndrome  . Right knee DJD   . Hyperlipemia   . Frequent urination at night   . Primary osteoarthritis of right knee   . Primary localized osteoarthritis of right hip   . Spinal stenosis of lumbar region with radiculopathy   . PONV (postoperative nausea and vomiting)   . Glaucoma     both eyes   Past Surgical History  Procedure Laterality Date  . Lithotripsy    . Shoulder surgery      right  . Elbow surgery      right  . Abdominal hernia repair    . Total knee arthroplasty Left 07/2006    left knee  . Hernia repair  1980    inguinal  . Shoulder arthroscopy  1980    right  . Elbow arthroscopy  1990    right  . Stone extraction with basket  2010  . Joint replacement  2008    knee  . Lumbar disc surgery      2015  . Total knee arthroplasty Right 01/08/2014    Procedure: RIGHT TOTAL KNEE ARTHROPLASTY;  Surgeon: Lorn Junes, MD;  Location: [facility];  Service: Orthopedics;  Laterality: Right;  . Back surgery    . Total hip arthroplasty Right  11/06/2014    Procedure: RIGHT TOTAL HIP ARTHROPLASTY ANTERIOR APPROACH;  Surgeon: Renette Butters, MD;  Location: [facility];  Service: Orthopedics;  Laterality: Right;  . Hip closed reduction Right 11/09/2014    Procedure: CLOSED REDUCTION HIP, s/p total hip 8/9;  Surgeon: Ninetta Lights, MD;  Location: [facility];  Service: Orthopedics;  Laterality: Right;   Family History  Problem Relation Age of Onset  . Hypertension Mother   . Hypertension Father   . Hypertension Brother   . Kidney disease Mother   . Diabetes Mellitus II Father   . Diabetes Mellitus II Brother   . Diabetes Mellitus II Brother   . Cancer - Prostate Brother    Social History  Substance Use Topics  . Smoking status: Never Smoker   . Smokeless tobacco: None  . Alcohol Use: Yes     Comment: glass of wine every 3-4 months    Review of Systems  All other systems reviewed and are negative.     Allergies  Morphine and related  Home Medications   Prior to Admission medications   Medication Sig Start Date End Date Taking? Authorizing Provider  allopurinol (ZYLOPRIM) 300  MG tablet Take 150 mg by mouth every morning. Take half a tablet (150mg ) by mouth daily   Yes Historical Provider, MD  amLODipine (NORVASC) 10 MG tablet Take 10 mg by mouth daily.   Yes Historical Provider, MD  aspirin 325 MG tablet Take 1 tablet (325 mg total) by mouth daily. 11/06/14  Yes Brittney Kelly, PA-C  atorvastatin (LIPITOR) 10 MG tablet Take 10 mg by mouth at bedtime.   Yes Historical Provider, MD  Cholecalciferol (VITAMIN D-3) 5000 UNITS TABS Take 5,000 Units by mouth daily.   Yes Historical Provider, MD  docusate sodium 100 MG CAPS Take 100 mg by mouth 2 (two) times daily. Patient taking differently: Take 100 mg by mouth 2 (two) times daily as needed (constipation).  01/10/14  Yes Kirstin Shepperson, PA-C  gemfibrozil (LOPID) 600 MG tablet Take 600 mg by mouth 2 (two) times daily.    Yes Historical Provider, MD  glipiZIDE (GLUCOTROL) 10 MG  tablet Take 10 mg by mouth 2 (two) times daily before a meal.   Yes Historical Provider, MD  HYDROcodone-acetaminophen (NORCO) 5-325 MG per tablet Take 1-2 tablets by mouth every 6 (six) hours as needed for moderate pain. 11/06/14  Yes Brittney Claiborne Billings, PA-C  metformin (FORTAMET) 1000 MG (OSM) 24 hr tablet Take 1,000 mg by mouth daily with breakfast.   Yes Historical Provider, MD  methocarbamol (ROBAXIN) 500 MG tablet Take 1 tablet (500 mg total) by mouth 4 (four) times daily. 11/06/14  Yes Brittney Kelly, PA-C  Multiple Vitamins-Minerals (VITRUM 50+ SENIOR MULTI PO) Take 1 tablet by mouth daily.    Yes Historical Provider, MD  polyethylene glycol (MIRALAX / GLYCOLAX) packet Take 17 g by mouth 2 (two) times daily. Patient taking differently: Take 17 g by mouth daily as needed for mild constipation.  01/10/14  Yes Kirstin Shepperson, PA-C  rOPINIRole (REQUIP) 2 MG tablet Take 2 mg by mouth at bedtime.   Yes Historical Provider, MD  Tamsulosin HCl (FLOMAX) 0.4 MG CAPS Take 0.4 mg by mouth at bedtime.    Yes Historical Provider, MD  diazepam (VALIUM) 5 MG tablet Take 1 tablet (5 mg total) by mouth at bedtime as needed. Patient not taking: Reported on 11/28/2014 11/07/14   Lovett Calender, PA-C  ondansetron (ZOFRAN) 4 MG tablet Take 1 tablet (4 mg total) by mouth every 8 (eight) hours as needed for nausea or vomiting. Patient not taking: Reported on 11/28/2014 11/06/14   Brittney Kelly, PA-C   BP 161/93 mmHg  Pulse 89  Resp 24  Wt 257 lb 15 oz (117 kg)  SpO2 97% Physical Exam  Constitutional: He is oriented to person, place, and time. He appears well-developed and well-nourished. He appears distressed.  HENT:  Head: Normocephalic and atraumatic.  Right Ear: External ear normal.  Mouth/Throat: Oropharynx is clear and moist.  Eyes: Conjunctivae are normal. Pupils are equal, round, and reactive to light.  Neck: Normal range of motion. Neck supple.  Cardiovascular: Normal rate, regular rhythm, normal heart  sounds and intact distal pulses.   Pulmonary/Chest: Effort normal and breath sounds normal.  Abdominal: Soft. Bowel sounds are normal.  Musculoskeletal:  Right hip internally rotated with some shortening and deformity noted. Pulses intact sensation intact right lower extremity.  Neurological: He is alert and oriented to person, place, and time.  Skin: Skin is warm and dry.  Psychiatric: He has a normal mood and affect.  Nursing note and vitals reviewed.   ED Course  Reduction of dislocation Date/Time: 11/28/2014 4:29 PM  Performed by: Pattricia Boss Authorized by: Pattricia Boss Consent: The procedure was performed in an emergent situation. Written consent obtained. Risks and benefits: risks, benefits and alternatives were discussed Consent given by: patient Patient understanding: patient states understanding of the procedure being performed Patient identity confirmed: verbally with patient Local anesthesia used: no Patient sedated: yes Sedation type: moderate (conscious) sedation Sedatives: ketamine Sedation start date/time: 11/28/2014 3:05 PM Sedation end date/time: 11/28/2014 4:35 PM Vitals: Vital signs were monitored during sedation. Patient tolerance: Patient tolerated the procedure well with no immediate complications Comments: Right hip reduced with traction and external rotation with palpable reduction and lengthening of leg.  Pulses and sensation intact post procedure.   Procedural sedation Date/Time: 11/28/2014 4:43 PM Performed by: Pattricia Boss Authorized by: Pattricia Boss Consent: Written consent obtained. Risks and benefits: risks, benefits and alternatives were discussed Consent given by: patient Patient understanding: patient states understanding of the procedure being performed Patient identity confirmed: verbally with patient Patient sedated: yes   (including critical care time) Labs Review Labs Reviewed  CBC - Abnormal; Notable for the following:    RBC 4.04  (*)    Hemoglobin 12.3 (*)    HCT 36.7 (*)    All other components within normal limits  BASIC METABOLIC PANEL - Abnormal; Notable for the following:    Glucose, Bld 209 (*)    BUN 28 (*)    Creatinine, Ser 1.34 (*)    GFR calc non Af Amer 51 (*)    GFR calc Af Amer 59 (*)    All other components within normal limits    Imaging Review Dg Chest 1 View  11/28/2014   CLINICAL DATA:  Hypertension  EXAM: CHEST  1 VIEW  COMPARISON:  Aug 09, 2014  FINDINGS: Lungs are clear. Heart is upper normal in size with pulmonary vascularity within normal limits. No adenopathy. No bone lesions. There is mild degenerative change in the thoracic spine.  IMPRESSION: No edema or consolidation.   Electronically Signed   By: Lowella Grip III M.D.   On: 11/28/2014 13:47   Dg Hip Port Unilat With Pelvis 1v Right  11/28/2014   CLINICAL DATA:  Dislocation of right hip prosthesis. Status postreduction.  EXAM: DG HIP (WITH OR WITHOUT PELVIS) 1V PORT RIGHT  COMPARISON:  Prior today  FINDINGS: There has been successful reduction of previously seen right hip prosthesis dislocation. Bipolar right hip prosthesis is now seen in normal alignment. No evidence of fracture or other bone lesion.  IMPRESSION: Successful reduction of previously seen bipolar right hip prosthesis dislocation.   Electronically Signed   By: Earle Gell M.D.   On: 11/28/2014 15:36   Dg Hip Unilat With Pelvis 2-3 Views Right  11/28/2014   CLINICAL DATA:  Acute onset pain  EXAM: DG HIP (WITH OR WITHOUT PELVIS) 2-3V RIGHT  COMPARISON:  November 09, 2014  FINDINGS: Frontal pelvis as well as frontal and lateral right hip images were obtained. There is a total hip prosthesis on the right. The femoral component of the prosthesis is dislocated posteriorly and laterally with respect to the acetabular component. No fracture. There is mild narrowing of the left hip joint. There is postoperative change in the lower lumbar spine.  IMPRESSION: Right total hip prosthesis  dislocation. No apparent fracture. Mild narrowing left hip joint.   Electronically Signed   By: Lowella Grip III M.D.   On: 11/28/2014 13:45   I have personally reviewed and evaluated these images and lab results as part  of my medical decision-making.   EKG Interpretation None      MDM   Final diagnoses:  Dislocated hip, right, initial encounter    74 year old man with a prosthetic right hip dislocation. I spoke with his orthopedist several times. Patient was sedated and had reduction of his right hip here in the ED. Post hip reduction x-Olivya Sobol showed successful reduction. Hip brace was ordered and has been replaced by outside technician here in the ED. The flexion is 10-70 at 15 abduction. Patient is given restrictions of wearing this at all times except in the shower. He is to follow-up with Dr. Percell Miller next week. Dr. Percell Miller will contact him by phone in the next 24 hours. CT of the hip was obtained prior to discharge for follow-up. Patient tolerated procedures well and is discharged in improved condition. He voices understanding of all return precautions and need for follow-up.    Pattricia Boss, MD 11/28/14 [phone]

## 2014-11-28 NOTE — ED Notes (Signed)
Pt alert x4 respirations easy non labored. Hip splint in place per ortho

## 2014-11-28 NOTE — ED Notes (Signed)
Pt return from xray family at bedside updated on poc.

## 2014-12-30 ENCOUNTER — Emergency Department (HOSPITAL_COMMUNITY): Payer: PPO

## 2014-12-30 ENCOUNTER — Encounter (HOSPITAL_COMMUNITY): Payer: Self-pay | Admitting: Emergency Medicine

## 2014-12-30 ENCOUNTER — Emergency Department (HOSPITAL_COMMUNITY)
Admission: EM | Admit: 2014-12-30 | Discharge: 2014-12-30 | Disposition: A | Discharge status: Home / Self Care | Payer: PPO | Attending: Emergency Medicine | Admitting: Emergency Medicine

## 2014-12-30 DIAGNOSIS — E785 Hyperlipidemia, unspecified: Secondary | ICD-10-CM | POA: Diagnosis not present

## 2014-12-30 DIAGNOSIS — E669 Obesity, unspecified: Secondary | ICD-10-CM | POA: Insufficient documentation

## 2014-12-30 DIAGNOSIS — Z8719 Personal history of other diseases of the digestive system: Secondary | ICD-10-CM | POA: Diagnosis not present

## 2014-12-30 DIAGNOSIS — Y9289 Other specified places as the place of occurrence of the external cause: Secondary | ICD-10-CM | POA: Insufficient documentation

## 2014-12-30 DIAGNOSIS — Z96641 Presence of right artificial hip joint: Secondary | ICD-10-CM | POA: Insufficient documentation

## 2014-12-30 DIAGNOSIS — Z8669 Personal history of other diseases of the nervous system and sense organs: Secondary | ICD-10-CM | POA: Diagnosis not present

## 2014-12-30 DIAGNOSIS — M179 Osteoarthritis of knee, unspecified: Secondary | ICD-10-CM | POA: Diagnosis not present

## 2014-12-30 DIAGNOSIS — Z87442 Personal history of urinary calculi: Secondary | ICD-10-CM | POA: Diagnosis not present

## 2014-12-30 DIAGNOSIS — M1611 Unilateral primary osteoarthritis, right hip: Secondary | ICD-10-CM | POA: Insufficient documentation

## 2014-12-30 DIAGNOSIS — Y9389 Activity, other specified: Secondary | ICD-10-CM | POA: Diagnosis not present

## 2014-12-30 DIAGNOSIS — IMO0001 Reserved for inherently not codable concepts without codable children: Secondary | ICD-10-CM

## 2014-12-30 DIAGNOSIS — Z7982 Long term (current) use of aspirin: Secondary | ICD-10-CM | POA: Insufficient documentation

## 2014-12-30 DIAGNOSIS — S79911A Unspecified injury of right hip, initial encounter: Secondary | ICD-10-CM | POA: Diagnosis present

## 2014-12-30 DIAGNOSIS — X58XXXA Exposure to other specified factors, initial encounter: Secondary | ICD-10-CM | POA: Insufficient documentation

## 2014-12-30 DIAGNOSIS — S73004A Unspecified dislocation of right hip, initial encounter: Secondary | ICD-10-CM | POA: Insufficient documentation

## 2014-12-30 DIAGNOSIS — E1169 Type 2 diabetes mellitus with other specified complication: Secondary | ICD-10-CM | POA: Insufficient documentation

## 2014-12-30 DIAGNOSIS — Y998 Other external cause status: Secondary | ICD-10-CM | POA: Diagnosis not present

## 2014-12-30 DIAGNOSIS — I10 Essential (primary) hypertension: Secondary | ICD-10-CM | POA: Diagnosis not present

## 2014-12-30 DIAGNOSIS — Z79899 Other long term (current) drug therapy: Secondary | ICD-10-CM | POA: Diagnosis not present

## 2014-12-30 LAB — CBC WITH DIFFERENTIAL/PLATELET
Basophils Absolute: 0 10*3/uL (ref 0.0–0.1)
Basophils Relative: 1 %
Eosinophils Absolute: 0.2 10*3/uL (ref 0.0–0.7)
Eosinophils Relative: 2 %
HCT: 39 % (ref 39.0–52.0)
Hemoglobin: 13.6 g/dL (ref 13.0–17.0)
Lymphocytes Relative: 14 %
Lymphs Abs: 1.1 10*3/uL (ref 0.7–4.0)
MCH: 31.1 pg (ref 26.0–34.0)
MCHC: 34.9 g/dL (ref 30.0–36.0)
MCV: 89.2 fL (ref 78.0–100.0)
Monocytes Absolute: 0.4 10*3/uL (ref 0.1–1.0)
Monocytes Relative: 5 %
Neutro Abs: 6.3 10*3/uL (ref 1.7–7.7)
Neutrophils Relative %: 78 %
Platelets: 282 10*3/uL (ref 150–400)
RBC: 4.37 MIL/uL (ref 4.22–5.81)
RDW: 14.5 % (ref 11.5–15.5)
WBC: 8 10*3/uL (ref 4.0–10.5)

## 2014-12-30 LAB — BASIC METABOLIC PANEL
Anion gap: 11 (ref 5–15)
BUN: 34 mg/dL — ABNORMAL HIGH (ref 6–20)
CO2: 23 mmol/L (ref 22–32)
Calcium: 10.7 mg/dL — ABNORMAL HIGH (ref 8.9–10.3)
Chloride: 105 mmol/L (ref 101–111)
Creatinine, Ser: 1.45 mg/dL — ABNORMAL HIGH (ref 0.61–1.24)
GFR calc Af Amer: 54 mL/min — ABNORMAL LOW (ref >60)
GFR calc non Af Amer: 46 mL/min — ABNORMAL LOW (ref >60)
Glucose, Bld: 261 mg/dL — ABNORMAL HIGH (ref 65–99)
Potassium: 4.1 mmol/L (ref 3.5–5.1)
Sodium: 139 mmol/L (ref 135–145)

## 2014-12-30 MED ORDER — OXYMETAZOLINE HCL 0.05 % NA SOLN
1.0000 | Freq: Once | NASAL | Status: AC
  Administered 2014-12-30: 1 via NASAL
  Filled 2014-12-30: qty 15

## 2014-12-30 MED ORDER — PROPOFOL 10 MG/ML IV BOLUS
INTRAVENOUS | Status: AC | PRN
  Administered 2014-12-30: 50 mg via INTRAVENOUS
  Administered 2014-12-30: 25 mg via INTRAVENOUS

## 2014-12-30 MED ORDER — FENTANYL CITRATE (PF) 100 MCG/2ML IJ SOLN
75.0000 ug | Freq: Once | INTRAMUSCULAR | Status: AC
  Administered 2014-12-30: 75 ug via INTRAVENOUS
  Filled 2014-12-30: qty 2

## 2014-12-30 MED ORDER — KETAMINE HCL 10 MG/ML IJ SOLN
INTRAMUSCULAR | Status: AC | PRN
  Administered 2014-12-30: 50 mg via INTRAVENOUS

## 2014-12-30 MED ORDER — KETAMINE HCL 10 MG/ML IJ SOLN
50.0000 mg | Freq: Once | INTRAMUSCULAR | Status: DC
  Filled 2014-12-30: qty 5

## 2014-12-30 MED ORDER — PROPOFOL 10 MG/ML IV BOLUS
50.0000 mg | Freq: Once | INTRAVENOUS | Status: DC
  Filled 2014-12-30: qty 20

## 2014-12-30 NOTE — ED Notes (Signed)
Pt reports he had a total hip replacement 11/06/14.  His hip has dislocated twice since the surgery.  Pt reports he was riding in the car this afternoon when he felt sudden pain to his right hip.  Pt is unable to put any weight on right leg and reports that the pain is the same as the previous dislocations.

## 2014-12-30 NOTE — ED Provider Notes (Signed)
CSN: [id number]     Arrival date & time 12/30/14  1431 History   First MD Initiated Contact with Patient 12/30/14 1456     Chief Complaint  Patient presents with  . Hip Pain     (Consider location/radiation/quality/duration/timing/severity/associated sxs/prior Treatment) HPI 74 year old male who presents with right hip pain. History of right hip arthroplasty 11/06/2014 by Dr. Percell Miller. Has been complicated by recurrent hip dislocation, last 11/28/2014. I reports being in his usual state of health, and about 1 hour prior to arrival reports sudden onset of right hip pain while sitting in a car. This feels similar to his prior hip dislocations. He has not had any falls or strenuous activity. Has been compliant with his hip brace, but has not been walking with walker or cane as he normally does. Denies any swelling or deformity to his hip, fevers, chills, weakness or numbness. He has been unable to walk since onset of pain. Past Medical History  Diagnosis Date  . Kidney stones   . Hypertension   . Diabetes mellitus type 2 in obese ([place])   . PONV (postoperative nausea and vomiting)   . GERD (gastroesophageal reflux disease)     occ indigestion  . Arthritis     arthirtis all over  . Neuromuscular disorder ([facility])     restless leg syndrome  . Right knee DJD   . Hyperlipemia   . Frequent urination at night   . Primary osteoarthritis of right knee   . Primary localized osteoarthritis of right hip   . Spinal stenosis of lumbar region with radiculopathy   . PONV (postoperative nausea and vomiting)   . Glaucoma     both eyes   Past Surgical History  Procedure Laterality Date  . Lithotripsy    . Shoulder surgery      right  . Elbow surgery      right  . Abdominal hernia repair    . Total knee arthroplasty Left 07/2006    left knee  . Hernia repair  1980    inguinal  . Shoulder arthroscopy  1980    right  . Elbow arthroscopy  1990    right  . Stone extraction with basket  2010  .  Joint replacement  2008    knee  . Lumbar disc surgery      2015  . Total knee arthroplasty Right 01/08/2014    Procedure: RIGHT TOTAL KNEE ARTHROPLASTY;  Surgeon: Lorn Junes, MD;  Location: [facility];  Service: Orthopedics;  Laterality: Right;  . Back surgery    . Total hip arthroplasty Right 11/06/2014    Procedure: RIGHT TOTAL HIP ARTHROPLASTY ANTERIOR APPROACH;  Surgeon: Renette Butters, MD;  Location: [facility];  Service: Orthopedics;  Laterality: Right;  . Hip closed reduction Right 11/09/2014    Procedure: CLOSED REDUCTION HIP, s/p total hip 8/9;  Surgeon: Ninetta Lights, MD;  Location: [facility];  Service: Orthopedics;  Laterality: Right;   Family History  Problem Relation Age of Onset  . Hypertension Mother   . Hypertension Father   . Hypertension Brother   . Kidney disease Mother   . Diabetes Mellitus II Father   . Diabetes Mellitus II Brother   . Diabetes Mellitus II Brother   . Cancer - Prostate Brother    Social History  Substance Use Topics  . Smoking status: Never Smoker   . Smokeless tobacco: None  . Alcohol Use: Yes     Comment: glass of wine every 3-4  months    Review of Systems 10/14 systems reviewed and are negative other than those stated in the HPI   Allergies  Morphine and related  Home Medications   Prior to Admission medications   Medication Sig Start Date End Date Taking? Authorizing Provider  allopurinol (ZYLOPRIM) 300 MG tablet Take 150 mg by mouth every morning. Take half a tablet (150mg ) by mouth daily   Yes Historical Provider, MD  amLODipine (NORVASC) 10 MG tablet Take 10 mg by mouth daily.   Yes Historical Provider, MD  aspirin 81 MG tablet Take 81 mg by mouth daily.   Yes Historical Provider, MD  atorvastatin (LIPITOR) 10 MG tablet Take 10 mg by mouth at bedtime.   Yes Historical Provider, MD  Cholecalciferol (VITAMIN D-3) 5000 UNITS TABS Take 5,000 Units by mouth daily.   Yes Historical Provider, MD  docusate sodium 100 MG CAPS Take 100 mg by  mouth 2 (two) times daily. Patient taking differently: Take 100 mg by mouth 2 (two) times daily as needed (constipation).  01/10/14  Yes Kirstin Shepperson, PA-C  gemfibrozil (LOPID) 600 MG tablet Take 600 mg by mouth 2 (two) times daily.    Yes Historical Provider, MD  HYDROcodone-acetaminophen (NORCO) 5-325 MG per tablet Take 1-2 tablets by mouth every 6 (six) hours as needed for moderate pain. 11/06/14  Yes Brittney Claiborne Billings, PA-C  metFORMIN (GLUCOPHAGE) 500 MG tablet Take 500 mg by mouth 2 (two) times daily with a meal.   Yes Historical Provider, MD  Multiple Vitamins-Minerals (VITRUM 50+ SENIOR MULTI PO) Take 1 tablet by mouth daily.    Yes Historical Provider, MD  Omega-3 Fatty Acids (FISH OIL) 1000 MG CAPS Take 1 capsule by mouth daily.   Yes Historical Provider, MD  polyethylene glycol (MIRALAX / GLYCOLAX) packet Take 17 g by mouth 2 (two) times daily. Patient taking differently: Take 17 g by mouth daily as needed for mild constipation.  01/10/14  Yes Kirstin Shepperson, PA-C  Tamsulosin HCl (FLOMAX) 0.4 MG CAPS Take 0.4 mg by mouth at bedtime.    Yes Historical Provider, MD  vitamin C (ASCORBIC ACID) 500 MG tablet Take 500 mg by mouth daily.   Yes Historical Provider, MD  glipiZIDE (GLUCOTROL) 10 MG tablet Take 10 mg by mouth 2 (two) times daily before a meal.    Historical Provider, MD   BP 132/63 mmHg  Pulse 85  Temp(Src) 97.7 F (36.5 C) (Oral)  Resp 14  SpO2 98% Physical Exam Physical Exam  Nursing note and vitals reviewed. Constitutional: Well developed, well nourished, non-toxic, and in no acute distress Head: Normocephalic and atraumatic.  Mouth/Throat: Oropharynx is clear and moist.  Neck: Normal range of motion. Neck supple.  Cardiovascular: Normal rate and regular rhythm.  +2 DP pulses with normal capillary refill.  Pulmonary/Chest: Effort normal and breath sounds normal.  Abdominal: Soft. There is no tenderness. There is no rebound and no guarding.  Musculoskeletal: Unable  to ROM right hip. NO appreciable deformity.  Neurological: Alert, no facial droop, fluent speech, sensation intact in nerve distributions of the perineal, para common peroneal, superficial peroneal, saphenous, and sural nerves bilaterally, full strength in ankle dorsi and plantar flexion Skin: Skin is warm and dry.  Psychiatric: Cooperative  ED Course  Procedural sedation Date/Time: 12/30/2014 8:02 PM Performed by: Brantley Stage DUO Authorized by: Brantley Stage DUO Consent: Verbal consent obtained. Risks and benefits: risks, benefits and alternatives were discussed Consent given by: patient Patient identity confirmed: verbally with patient Time out: Immediately  prior to procedure a "time out" was called to verify the correct patient, procedure, equipment, support staff and site/side marked as required. Local anesthesia used: no Patient sedated: yes Sedatives: propofol Analgesia: ketamine Vitals: Vital signs were monitored during sedation. Patient tolerance: Patient tolerated the procedure well with no immediate complications  Reduction of dislocation Date/Time: 12/30/2014 8:02 PM Performed by: Brantley Stage DUO Authorized by: Brantley Stage DUO Consent: Verbal consent obtained. Risks and benefits: risks, benefits and alternatives were discussed Consent given by: patient Patient identity confirmed: verbally with patient Time out: Immediately prior to procedure a "time out" was called to verify the correct patient, procedure, equipment, support staff and site/side marked as required. Local anesthesia used: no Patient sedated: yes Vitals: Vital signs were monitored during sedation. Patient tolerance: Patient tolerated the procedure well with no immediate complications Comments: Upward traction applied to leg with manual downward traction on hip   (including critical care time) Labs Review Labs Reviewed  BASIC METABOLIC PANEL - Abnormal; Notable for the following:    Glucose, Bld 261 (*)    BUN 34  (*)    Creatinine, Ser 1.45 (*)    Calcium 10.7 (*)    GFR calc non Af Amer 46 (*)    GFR calc Af Amer 54 (*)    All other components within normal limits  CBC WITH DIFFERENTIAL/PLATELET    Imaging Review Dg Hip Port Unilat With Pelvis 1v Right  12/30/2014   CLINICAL DATA:  Status post right hip dislocation  EXAM: DG HIP (WITH OR WITHOUT PELVIS) 1V PORT RIGHT  COMPARISON:  X-ray earlier same day  FINDINGS: Right total hip arthroplasty with satisfactory reduction of right hip dislocation. No acute fracture. No hardware failure or complication.  IMPRESSION: Interval satisfactory reduction of right hip dislocation.   Electronically Signed   By: Kathreen Devoid   On: 12/30/2014 19:55   Dg Hip Unilat  With Pelvis 2-3 Views Right  12/30/2014   CLINICAL DATA:  Right hip pain. Replacement and dislocation of replacement x2.  EXAM: DG HIP (WITH OR WITHOUT PELVIS) 2-3V RIGHT  COMPARISON:  11/28/2014  FINDINGS: Lumbar spine fixation. Superolateral dislocation of the femoral component of the right hip arthroplasty. No complicating fracture.  IMPRESSION: Right hip arthroplasty with super lateral dislocation of the femoral component.   Electronically Signed   By: Abigail Miyamoto M.D.   On: 12/30/2014 15:45   I have personally reviewed and evaluated these images and lab results as part of my medical decision-making.    MDM   Final diagnoses:  Dislocation  Hip dislocation, right, initial encounter [facility])    74 year old male with right hip arthroplasty who presents with recurrent right hip dislocation. X-ray without fracture and superior lateral dislocation of the right hip. He is well-appearing and vital signs unremarkable. Extremity is neurovascularly intact. I reduction done under procedural sedation. Post reduction neurovascular exam in tact. Repeat x-ray confirms reduction. Discussed with orthopedic surgery, and recommended follow-up with Dr. Percell Miller in office in one day to discuss revision as outpatient.  Strict return follow-up instructions are reviewed. He expressed understanding of all discharge instructions above comfortable to plan of care.    Forde Dandy, MD 12/30/14 2006

## 2014-12-30 NOTE — Discharge Instructions (Signed)
Return without fail for worsening symptoms, including new numbness or weakness, worsening pain, recurrent dislocation, or any other symptoms concerning to you. Please call Dr. Debroah Loop office tomorrow after 8:30AM to schedule close follow-up appointment.  Hip Dislocation Hip dislocation is the displacement of the "ball" at the head of your thigh bone (femur) from its socket in the hip bone (pelvis). The ball-and-socket structure of the hip joint gives it a lot of stability, while allowing it to move freely. Therefore, a lot of force is required to displace the femur from its socket. A hip dislocation is an emergency. If you believe you have dislocated your hip and cannot move your leg, call for help immediately. Do not try to move. CAUSES The most common cause of hip dislocation is motor vehicle accidents. However, force from falls from a height (a ladder or building), injuries from contact sports, or injuries from industrial accidents can be enough to dislocate your hip. SYMPTOMS A hip dislocation is very painful. If you have a dislocated hip, you will not be able to move your hip. If you have nerve damage, you may not have feeling in your lower leg, foot, or ankle.  DIAGNOSIS Usually, your caregiver can diagnose a hip dislocation by looking at the position of your leg. Generally, X-ray exams are done to check for fractures in your femur or pelvis. The leg of the dislocated hip will appear shorter than the other leg, and your foot will be turned inward. TREATMENT  Your caregiver can manipulate your bones back into the joint (reduction). If there are no other complications involved with your dislocation, such as fractures or damage to blood vessels or nerves, this procedure can be done without surgery. Before this procedure, you will be given medicine so that you will not feel pain (anesthetic). Often specialized imaging exams are done after the reduction (magnetic resonance imaging [MRI] or computed  tomography [CT]) to check for loose pieces of cartilage or bone in the joint. If a manual reduction fails or you have nerve damage, damage to your blood vessels, or bone fractures, surgery will be necessary to perform the reduction.  HOME CARE INSTRUCTIONS The following measures can help to reduce pain and speed up the healing process:  Rest your injured joint. Do not move your joint if it is painful. Also, avoid activities similar to the one that caused your injury.  Apply ice to your injured joint for 1 to 2 days after your reduction or as directed by your caregiver. Applying ice helps to reduce inflammation and pain.  Put ice in a plastic bag.  Place a towel between your skin and the bag.  Leave the ice on for 15 to 20 minutes at a time, every 2 hours while you are awake.  Use crutches or a walker as directed by your caregiver.  Exercise your hip and leg as directed by your caregiver.  Take over-the-counter or prescription medicine for pain as directed by your caregiver. SEEK IMMEDIATE MEDICAL CARE IF:  Your pain becomes worse rather than better.  You feel like your hip has become dislocated again. MAKE SURE YOU:  Understand these instructions.  Will watch your condition.  Will get help right away if you are not doing well or get worse. Document Released: 12/09/2000 Document Revised: 06/08/2011 Document Reviewed: 08/14/2010 [facility] Patient Information 2015 [place], [place]. This information is not intended to replace advice given to you by your health care provider. Make sure you discuss any questions you have with your  health care provider. ° °

## 2014-12-30 NOTE — ED Notes (Signed)
Pt sts right hip pain while sitting in car; pt with hx of hip dislocation and feels same today

## 2014-12-30 NOTE — ED Notes (Signed)
Discharge instructions reviewed with patient/spouse. Understanding verbalized. Denies pain. Reports "a little soreness" in R hip. No acute distress noted. Patient transported from ED in wheelchair.

## 2015-01-03 ENCOUNTER — Encounter (HOSPITAL_COMMUNITY): Payer: Self-pay

## 2015-01-03 ENCOUNTER — Encounter (HOSPITAL_COMMUNITY)
Admission: RE | Admit: 2015-01-03 | Discharge: 2015-01-03 | Disposition: A | Discharge status: Home / Self Care | Payer: PPO | Source: Ambulatory Visit | Attending: Orthopedic Surgery | Admitting: Orthopedic Surgery

## 2015-01-03 DIAGNOSIS — Z0183 Encounter for blood typing: Secondary | ICD-10-CM | POA: Diagnosis not present

## 2015-01-03 DIAGNOSIS — Z01812 Encounter for preprocedural laboratory examination: Secondary | ICD-10-CM | POA: Diagnosis present

## 2015-01-03 DIAGNOSIS — M24451 Recurrent dislocation, right hip: Secondary | ICD-10-CM | POA: Diagnosis not present

## 2015-01-03 HISTORY — DX: Restless legs syndrome: G25.81

## 2015-01-03 LAB — BASIC METABOLIC PANEL
Anion gap: 10 (ref 5–15)
BUN: 18 mg/dL (ref 6–20)
CO2: 25 mmol/L (ref 22–32)
Calcium: 9.9 mg/dL (ref 8.9–10.3)
Chloride: 106 mmol/L (ref 101–111)
Creatinine, Ser: 1.31 mg/dL — ABNORMAL HIGH (ref 0.61–1.24)
GFR calc Af Amer: >60 mL/min (ref >60)
GFR calc non Af Amer: 52 mL/min — ABNORMAL LOW (ref >60)
Glucose, Bld: 216 mg/dL — ABNORMAL HIGH (ref 65–99)
Potassium: 3.7 mmol/L (ref 3.5–5.1)
Sodium: 141 mmol/L (ref 135–145)

## 2015-01-03 LAB — SURGICAL PCR SCREEN
MRSA, PCR: NEGATIVE
Staphylococcus aureus: NEGATIVE

## 2015-01-03 LAB — CBC
HCT: 40.4 % (ref 39.0–52.0)
Hemoglobin: 13.9 g/dL (ref 13.0–17.0)
MCH: 31 pg (ref 26.0–34.0)
MCHC: 34.4 g/dL (ref 30.0–36.0)
MCV: 90.2 fL (ref 78.0–100.0)
Platelets: 296 10*3/uL (ref 150–400)
RBC: 4.48 MIL/uL (ref 4.22–5.81)
RDW: 14.6 % (ref 11.5–15.5)
WBC: 6.7 10*3/uL (ref 4.0–10.5)

## 2015-01-03 LAB — GLUCOSE, CAPILLARY: Glucose-Capillary: 172 mg/dL — ABNORMAL HIGH (ref 65–99)

## 2015-01-03 NOTE — Progress Notes (Signed)
PCP is Dr Ernie Hew Denies seeing a cardiologist. States he had a stress test 10-12 years ago- states that everything was fine. Denies ever having a card cath or echo. States he had a sleep study done, but was not sleep apnea- had restless leg syndrome.  Cbg today was 172 Reports his fasting cbgs usually run in the 140's, but has been up some since he has had more pain

## 2015-01-03 NOTE — Pre-Procedure Instructions (Addendum)
Velmer N Biswas  01/03/2015      [facility] - [place] - [address] [address] [place] [address] Phone: [phone] Fax: [phone]    Your procedure is scheduled on Oct 11  Report to [facility] at 1030 A.M.  Call this number if you have problems the morning of surgery:  [phone]   Remember:  Do not eat food or drink liquids after midnight.  Take these medicines the morning of surgery with A SIP OF WATER allopurinol (Zyloprim), amlodipine (Norvasc), Hydrocodone-acetaminophen (Norco) if needed How to Manage Your Diabetes Before Surgery   Why is it important to control my blood sugar before and after surgery?   Improving blood sugar levels before and after surgery helps healing and can limit problems.  A way of improving blood sugar control is eating a healthy diet by:  - Eating less sugar and carbohydrates  - Increasing activity/exercise  - Talk with your doctor about reaching your blood sugar goals  High blood sugars (greater than 180 mg/dL) can raise your risk of infections and slow down your recovery so you will need to focus on controlling your diabetes during the weeks before surgery.  Make sure that the doctor who takes care of your diabetes knows about your planned surgery including the date and location.  How do I manage my blood sugars before surgery?   Check your blood sugar at least 4 times a day, 2 days before surgery to make sure that they are not too high or low.   Check your blood sugar the morning of your surgery when you wake up and every 2               hours until you get to the Short-Stay unit.  If your blood sugar is less than 70 mg/dL, you will need to treat for low blood sugar by:  Treat a low blood sugar (less than 70 mg/dL) with 1/2 cup of clear juice (cranberry or apple), 4 glucose tablets, OR glucose gel.  Recheck blood sugar in 15 minutes after  treatment (to make sure it is greater than 70 mg/dL).  If blood sugar is not greater than 70 mg/dL on re-check, call [phone] for further instructions.   Report your blood sugar to the Short-Stay nurse when you get to Short-Stay.  References:  [facility], 2007 "How to Manage your Diabetes Before and After Surgery".  What do I do about my diabetes medications?   Do not take oral diabetes medicines (pills) the morning of surgery.      THE MORNING OF SURGERY, take 35 units of Lantus Insulin.    Do not take other diabetes injectables the day of surgery including Byetta, Victoza, Bydureon, and Trulicity.    If your CBG is greater than 220 mg/dL, you may take 1/2 of your sliding scale (correction) dose of insulin.   For patients with "Insulin Pumps":  Contact your diabetes doctor for specific instructions before surgery.   Decrease basal insulin rates by 20% at midnight the night before surgery.  Note that if your surgery is planned to be longer than 2 hours, your insulin pump will be removed and intravenous (IV) insulin will be started and managed by the nurses and anesthesiologist.  You will be able to restart your insulin pump once you are awake and able to manage it.  Make sure to bring insulin pump supplies to  the hospital with you in case your site needs to be changed.       Stop taking aspirin, Ibuprofen Aleve, BC's, Goody's, Herbal medications, Fish Oil, Multi vitamins   Do not wear jewelry, make-up or nail polish.  Do not wear lotions, powders, or perfumes.  You may wear deodorant.  Do not shave 48 hours prior to surgery.  Men may shave face and neck.  Do not bring valuables to the hospital.  [facility] is not responsible for any belongings or valuables.  Contacts, dentures or bridgework may not be worn into surgery.  Leave your suitcase in the car.  After surgery it may be brought to your room.  For patients admitted to the  hospital, discharge time will be determined by your treatment team.  Patients discharged the day of surgery will not be allowed to drive home.    Special instructions:  [facility] - Preparing for Surgery  Before surgery, you can play an important role.  Because skin is not sterile, your skin needs to be as free of germs as possible.  You can reduce the number of germs on you skin by washing with CHG (chlorahexidine gluconate) soap before surgery.  CHG is an antiseptic cleaner which kills germs and bonds with the skin to continue killing germs even after washing.  Please DO NOT use if you have an allergy to CHG or antibacterial soaps.  If your skin becomes reddened/irritated stop using the CHG and inform your nurse when you arrive at Short Stay.  Do not shave (including legs and underarms) for at least 48 hours prior to the first CHG shower.  You may shave your face.  Please follow these instructions carefully:   1.  Shower with CHG Soap the night before surgery and the  morning of Surgery.  2.  If you choose to wash your hair, wash your hair first as usual with your  normal shampoo.  3.  After you shampoo, rinse your hair and body thoroughly to remove the  Shampoo.  4.  Use CHG as you would any other liquid soap.  You can apply chg directly  to the skin and wash gently with scrungie or a clean washcloth.  5.  Apply the CHG Soap to your body ONLY FROM THE NECK DOWN.  Do not use on open wounds or open sores.  Avoid contact with your eyes,   ears, mouth and genitals (private parts).  Wash genitals (private parts) with your normal soap.  6.  Wash thoroughly, paying special attention to the area where your surgery   will be performed.  7.  Thoroughly rinse your body with warm water from the neck down.  8.  DO NOT shower/wash with your normal soap after using and rinsing off  the CHG Soap.  9.  Pat yourself dry with a clean towel.            10.  Wear clean pajamas.            11.  Place clean  sheets on your bed the night of your first shower and do not  sleep with pets.  Day of Surgery  Do not apply any lotions/deoderants the morning of surgery.  Please wear clean clothes to the hospital/surgery center.     Please read over the following fact sheets that you were given. Pain Booklet, Coughing and Deep Breathing, Blood Transfusion Information, MRSA Information and Surgical Site Infection Prevention

## 2015-01-04 LAB — TYPE AND SCREEN
ABO/RH(D): A POS
Antibody Screen: NEGATIVE

## 2015-01-04 LAB — HEMOGLOBIN A1C
Hgb A1c MFr Bld: 8.8 % — ABNORMAL HIGH (ref 4.8–5.6)
Mean Plasma Glucose: 206 mg/dL

## 2015-01-08 ENCOUNTER — Encounter (HOSPITAL_COMMUNITY): Payer: Self-pay | Admitting: Certified Registered Nurse Anesthetist

## 2015-01-08 ENCOUNTER — Inpatient Hospital Stay (HOSPITAL_COMMUNITY): Payer: PPO | Admitting: Anesthesiology

## 2015-01-08 ENCOUNTER — Inpatient Hospital Stay (HOSPITAL_COMMUNITY)
Admission: RE | Admit: 2015-01-08 | Discharge: 2015-01-09 | DRG: 468 | Disposition: A | Discharge status: Home / Self Care | Payer: PPO | Source: Ambulatory Visit | Attending: Orthopedic Surgery | Admitting: Orthopedic Surgery

## 2015-01-08 ENCOUNTER — Inpatient Hospital Stay (HOSPITAL_COMMUNITY): Payer: PPO

## 2015-01-08 ENCOUNTER — Encounter (HOSPITAL_COMMUNITY)
Admission: RE | Disposition: A | Discharge status: Home / Self Care | Payer: Self-pay | Source: Ambulatory Visit | Attending: Orthopedic Surgery

## 2015-01-08 DIAGNOSIS — Y831 Surgical operation with implant of artificial internal device as the cause of abnormal reaction of the patient, or of later complication, without mention of misadventure at the time of the procedure: Secondary | ICD-10-CM | POA: Diagnosis present

## 2015-01-08 DIAGNOSIS — M1990 Unspecified osteoarthritis, unspecified site: Secondary | ICD-10-CM | POA: Diagnosis present

## 2015-01-08 DIAGNOSIS — E669 Obesity, unspecified: Secondary | ICD-10-CM | POA: Diagnosis present

## 2015-01-08 DIAGNOSIS — T84020A Dislocation of internal right hip prosthesis, initial encounter: Secondary | ICD-10-CM | POA: Diagnosis present

## 2015-01-08 DIAGNOSIS — E119 Type 2 diabetes mellitus without complications: Secondary | ICD-10-CM | POA: Diagnosis present

## 2015-01-08 DIAGNOSIS — I10 Essential (primary) hypertension: Secondary | ICD-10-CM | POA: Diagnosis present

## 2015-01-08 DIAGNOSIS — E785 Hyperlipidemia, unspecified: Secondary | ICD-10-CM | POA: Diagnosis present

## 2015-01-08 DIAGNOSIS — Z96649 Presence of unspecified artificial hip joint: Secondary | ICD-10-CM

## 2015-01-08 HISTORY — PX: REVISION TOTAL HIP ARTHROPLASTY: SHX766

## 2015-01-08 HISTORY — PX: ANTERIOR HIP REVISION: SHX6527

## 2015-01-08 LAB — GLUCOSE, CAPILLARY
Glucose-Capillary: 170 mg/dL — ABNORMAL HIGH (ref 65–99)
Glucose-Capillary: 140 mg/dL — ABNORMAL HIGH (ref 65–99)
Glucose-Capillary: 284 mg/dL — ABNORMAL HIGH (ref 65–99)

## 2015-01-08 SURGERY — REVISION, TOTAL ARTHROPLASTY, HIP, ANTERIOR APPROACH
Anesthesia: Spinal | Laterality: Right

## 2015-01-08 MED ORDER — MENTHOL 3 MG MT LOZG: 1.0000 | LOZENGE | OROMUCOSAL | Status: DC | PRN

## 2015-01-08 MED ORDER — BUPIVACAINE IN DEXTROSE 0.75-8.25 % IT SOLN
INTRATHECAL | Status: DC | PRN
  Administered 2015-01-08: 15 mg via INTRATHECAL

## 2015-01-08 MED ORDER — ONDANSETRON HCL 4 MG PO TABS: 4.0000 mg | ORAL_TABLET | Freq: Three times a day (TID) | ORAL | Status: DC | PRN

## 2015-01-08 MED ORDER — HYDROMORPHONE HCL 1 MG/ML IJ SOLN
INTRAMUSCULAR | Status: AC
  Filled 2015-01-08: qty 1

## 2015-01-08 MED ORDER — VITAMIN C 500 MG PO TABS
500.0000 mg | ORAL_TABLET | Freq: Every day | ORAL | Status: DC
  Administered 2015-01-09: 500 mg via ORAL
  Filled 2015-01-08: qty 1

## 2015-01-08 MED ORDER — POLYETHYLENE GLYCOL 3350 17 G PO PACK
17.0000 g | PACK | Freq: Two times a day (BID) | ORAL | Status: DC
  Administered 2015-01-09: 17 g via ORAL
  Filled 2015-01-08: qty 1

## 2015-01-08 MED ORDER — ONDANSETRON HCL 4 MG/2ML IJ SOLN
INTRAMUSCULAR | Status: DC | PRN
  Administered 2015-01-08: 4 mg via INTRAVENOUS

## 2015-01-08 MED ORDER — AMLODIPINE BESYLATE 10 MG PO TABS
10.0000 mg | ORAL_TABLET | Freq: Every day | ORAL | Status: DC
  Administered 2015-01-09: 10 mg via ORAL
  Filled 2015-01-08: qty 1

## 2015-01-08 MED ORDER — CEFAZOLIN SODIUM-DEXTROSE 2-3 GM-% IV SOLR
2.0000 g | Freq: Four times a day (QID) | INTRAVENOUS | Status: AC
  Administered 2015-01-08: 2 g via INTRAVENOUS
  Filled 2015-01-08 (×2): qty 50

## 2015-01-08 MED ORDER — PHENYLEPHRINE HCL 10 MG/ML IJ SOLN
INTRAMUSCULAR | Status: DC | PRN
  Administered 2015-01-08 (×2): 80 ug via INTRAVENOUS
  Administered 2015-01-08: 40 ug via INTRAVENOUS

## 2015-01-08 MED ORDER — ONDANSETRON HCL 4 MG PO TABS: 4.0000 mg | ORAL_TABLET | Freq: Four times a day (QID) | ORAL | Status: DC | PRN

## 2015-01-08 MED ORDER — METHOCARBAMOL 1000 MG/10ML IJ SOLN
500.0000 mg | Freq: Four times a day (QID) | INTRAVENOUS | Status: DC | PRN
  Filled 2015-01-08: qty 5

## 2015-01-08 MED ORDER — TAMSULOSIN HCL 0.4 MG PO CAPS: 0.4000 mg | ORAL_CAPSULE | Freq: Every day | ORAL | Status: DC

## 2015-01-08 MED ORDER — POTASSIUM CHLORIDE IN NACL 20-0.45 MEQ/L-% IV SOLN
INTRAVENOUS | Status: DC
  Filled 2015-01-08: qty 1000

## 2015-01-08 MED ORDER — DEXAMETHASONE SODIUM PHOSPHATE 10 MG/ML IJ SOLN
10.0000 mg | Freq: Once | INTRAMUSCULAR | Status: AC
  Administered 2015-01-09: 10 mg via INTRAVENOUS
  Filled 2015-01-08: qty 1

## 2015-01-08 MED ORDER — SODIUM CHLORIDE 0.9 % IR SOLN
Status: DC | PRN
  Administered 2015-01-08: 3000 mL

## 2015-01-08 MED ORDER — 0.9 % SODIUM CHLORIDE (POUR BTL) OPTIME
TOPICAL | Status: DC | PRN
  Administered 2015-01-08: 1000 mL

## 2015-01-08 MED ORDER — ASPIRIN 325 MG PO TABS: 325.0000 mg | ORAL_TABLET | Freq: Every day | ORAL | Status: DC

## 2015-01-08 MED ORDER — VITAMIN D 1000 UNITS PO TABS
5000.0000 [IU] | ORAL_TABLET | Freq: Every day | ORAL | Status: DC
  Administered 2015-01-09: 5000 [IU] via ORAL
  Filled 2015-01-08: qty 5

## 2015-01-08 MED ORDER — POTASSIUM CHLORIDE IN NACL 20-0.45 MEQ/L-% IV SOLN
INTRAVENOUS | Status: DC
  Filled 2015-01-08 (×3): qty 1000

## 2015-01-08 MED ORDER — PROPOFOL 10 MG/ML IV BOLUS
INTRAVENOUS | Status: AC
  Filled 2015-01-08: qty 20

## 2015-01-08 MED ORDER — METOCLOPRAMIDE HCL 5 MG/ML IJ SOLN
5.0000 mg | Freq: Three times a day (TID) | INTRAMUSCULAR | Status: DC | PRN
  Administered 2015-01-08: 10 mg via INTRAVENOUS
  Filled 2015-01-08: qty 2

## 2015-01-08 MED ORDER — LIDOCAINE HCL (CARDIAC) 20 MG/ML IV SOLN
INTRAVENOUS | Status: AC
  Filled 2015-01-08: qty 5

## 2015-01-08 MED ORDER — TRANEXAMIC ACID 1000 MG/10ML IV SOLN
2000.0000 mg | INTRAVENOUS | Status: DC | PRN
  Administered 2015-01-08: 2000 mg via INTRAVENOUS

## 2015-01-08 MED ORDER — HYDROCODONE-ACETAMINOPHEN 5-325 MG PO TABS
1.0000 | ORAL_TABLET | ORAL | Status: DC | PRN
  Administered 2015-01-09: 1 via ORAL
  Filled 2015-01-08: qty 1

## 2015-01-08 MED ORDER — METHOCARBAMOL 500 MG PO TABS: 500.0000 mg | ORAL_TABLET | Freq: Four times a day (QID) | ORAL | Status: DC

## 2015-01-08 MED ORDER — HYDROCODONE-ACETAMINOPHEN 5-325 MG PO TABS: 1.0000 | ORAL_TABLET | Freq: Four times a day (QID) | ORAL | Status: DC | PRN

## 2015-01-08 MED ORDER — PHENOL 1.4 % MT LIQD
1.0000 | OROMUCOSAL | Status: DC | PRN
Start: 2015-01-08 — End: 2015-01-09

## 2015-01-08 MED ORDER — ACETAMINOPHEN 500 MG PO TABS: 1000.0000 mg | ORAL_TABLET | Freq: Once | ORAL | Status: DC

## 2015-01-08 MED ORDER — PHENYLEPHRINE 40 MCG/ML (10ML) SYRINGE FOR IV PUSH (FOR BLOOD PRESSURE SUPPORT)
PREFILLED_SYRINGE | INTRAVENOUS | Status: AC
  Filled 2015-01-08: qty 10

## 2015-01-08 MED ORDER — GLIPIZIDE 5 MG PO TABS: 10.0000 mg | ORAL_TABLET | Freq: Two times a day (BID) | ORAL | Status: DC

## 2015-01-08 MED ORDER — ATORVASTATIN CALCIUM 10 MG PO TABS
10.0000 mg | ORAL_TABLET | Freq: Every day | ORAL | Status: DC
  Administered 2015-01-08: 10 mg via ORAL
  Filled 2015-01-08: qty 1

## 2015-01-08 MED ORDER — CHLORHEXIDINE GLUCONATE 4 % EX LIQD: 60.0000 mL | Freq: Once | CUTANEOUS | Status: DC

## 2015-01-08 MED ORDER — ASPIRIN EC 325 MG PO TBEC
325.0000 mg | DELAYED_RELEASE_TABLET | Freq: Every day | ORAL | Status: DC
  Administered 2015-01-09: 325 mg via ORAL
  Filled 2015-01-08: qty 1

## 2015-01-08 MED ORDER — BUPIVACAINE LIPOSOME 1.3 % IJ SUSP
20.0000 mL | INTRAMUSCULAR | Status: DC
  Filled 2015-01-08: qty 20

## 2015-01-08 MED ORDER — FENTANYL CITRATE (PF) 100 MCG/2ML IJ SOLN
INTRAMUSCULAR | Status: DC | PRN
  Administered 2015-01-08: 50 ug via INTRAVENOUS

## 2015-01-08 MED ORDER — ACETAMINOPHEN 325 MG PO TABS: 650.0000 mg | ORAL_TABLET | Freq: Four times a day (QID) | ORAL | Status: DC | PRN

## 2015-01-08 MED ORDER — INSULIN GLARGINE 100 UNIT/ML ~~LOC~~ SOLN
70.0000 [IU] | Freq: Every day | SUBCUTANEOUS | Status: DC
  Administered 2015-01-09: 70 [IU] via SUBCUTANEOUS
  Filled 2015-01-08: qty 0.7

## 2015-01-08 MED ORDER — TRANEXAMIC ACID 1000 MG/10ML IV SOLN
2000.0000 mg | INTRAVENOUS | Status: DC
  Filled 2015-01-08: qty 20

## 2015-01-08 MED ORDER — DEXAMETHASONE SODIUM PHOSPHATE 10 MG/ML IJ SOLN
INTRAMUSCULAR | Status: AC
  Filled 2015-01-08: qty 1

## 2015-01-08 MED ORDER — CELECOXIB 200 MG PO CAPS
200.0000 mg | ORAL_CAPSULE | Freq: Two times a day (BID) | ORAL | Status: DC
  Administered 2015-01-08 – 2015-01-09 (×2): 200 mg via ORAL
  Filled 2015-01-08 (×2): qty 1

## 2015-01-08 MED ORDER — LIDOCAINE HCL (CARDIAC) 20 MG/ML IV SOLN
INTRAVENOUS | Status: DC | PRN
  Administered 2015-01-08: 100 mg via INTRAVENOUS

## 2015-01-08 MED ORDER — GEMFIBROZIL 600 MG PO TABS
600.0000 mg | ORAL_TABLET | Freq: Two times a day (BID) | ORAL | Status: DC
  Administered 2015-01-08 – 2015-01-09 (×2): 600 mg via ORAL
  Filled 2015-01-08 (×3): qty 1

## 2015-01-08 MED ORDER — ONDANSETRON HCL 4 MG/2ML IJ SOLN
INTRAMUSCULAR | Status: AC
  Filled 2015-01-08: qty 2

## 2015-01-08 MED ORDER — PROPOFOL 500 MG/50ML IV EMUL
INTRAVENOUS | Status: DC | PRN
  Administered 2015-01-08: 50 ug/kg/min via INTRAVENOUS

## 2015-01-08 MED ORDER — ONDANSETRON HCL 4 MG/2ML IJ SOLN
4.0000 mg | Freq: Four times a day (QID) | INTRAMUSCULAR | Status: DC | PRN
  Administered 2015-01-08: 4 mg via INTRAVENOUS
  Filled 2015-01-08: qty 2

## 2015-01-08 MED ORDER — CEFAZOLIN SODIUM-DEXTROSE 2-3 GM-% IV SOLR
INTRAVENOUS | Status: AC
  Filled 2015-01-08: qty 50

## 2015-01-08 MED ORDER — HYDROMORPHONE HCL 1 MG/ML IJ SOLN
1.0000 mg | INTRAMUSCULAR | Status: DC | PRN
  Administered 2015-01-08: 1 mg via INTRAVENOUS
  Filled 2015-01-08: qty 1

## 2015-01-08 MED ORDER — METOCLOPRAMIDE HCL 5 MG PO TABS: 5.0000 mg | ORAL_TABLET | Freq: Three times a day (TID) | ORAL | Status: DC | PRN

## 2015-01-08 MED ORDER — METHOCARBAMOL 500 MG PO TABS: 500.0000 mg | ORAL_TABLET | Freq: Four times a day (QID) | ORAL | Status: DC | PRN

## 2015-01-08 MED ORDER — FENTANYL CITRATE (PF) 250 MCG/5ML IJ SOLN
INTRAMUSCULAR | Status: AC
  Filled 2015-01-08: qty 5

## 2015-01-08 MED ORDER — ALLOPURINOL 300 MG PO TABS
150.0000 mg | ORAL_TABLET | Freq: Every day | ORAL | Status: DC
  Administered 2015-01-09: 150 mg via ORAL
  Filled 2015-01-08: qty 1

## 2015-01-08 MED ORDER — METFORMIN HCL 500 MG PO TABS
500.0000 mg | ORAL_TABLET | Freq: Two times a day (BID) | ORAL | Status: DC
  Administered 2015-01-09: 500 mg via ORAL
  Filled 2015-01-08: qty 1

## 2015-01-08 MED ORDER — DEXAMETHASONE SODIUM PHOSPHATE 10 MG/ML IJ SOLN
INTRAMUSCULAR | Status: DC | PRN
  Administered 2015-01-08: 10 mg via INTRAVENOUS

## 2015-01-08 MED ORDER — LACTATED RINGERS IV SOLN
INTRAVENOUS | Status: DC
  Administered 2015-01-08 (×3): via INTRAVENOUS

## 2015-01-08 MED ORDER — MIDAZOLAM HCL 2 MG/2ML IJ SOLN
INTRAMUSCULAR | Status: AC
  Filled 2015-01-08: qty 2

## 2015-01-08 MED ORDER — CEFAZOLIN SODIUM-DEXTROSE 2-3 GM-% IV SOLR
2.0000 g | INTRAVENOUS | Status: AC
  Administered 2015-01-08: 2 g via INTRAVENOUS

## 2015-01-08 MED ORDER — ACETAMINOPHEN 650 MG RE SUPP: 650.0000 mg | Freq: Four times a day (QID) | RECTAL | Status: DC | PRN

## 2015-01-08 MED ORDER — HYDROMORPHONE HCL 1 MG/ML IJ SOLN
0.2500 mg | INTRAMUSCULAR | Status: DC | PRN
  Administered 2015-01-08: 0.25 mg via INTRAVENOUS
  Administered 2015-01-08: 0.5 mg via INTRAVENOUS

## 2015-01-08 MED ORDER — MIDAZOLAM HCL 5 MG/5ML IJ SOLN
INTRAMUSCULAR | Status: DC | PRN
  Administered 2015-01-08 (×2): 1 mg via INTRAVENOUS

## 2015-01-08 SURGICAL SUPPLY — 53 items
BAG DECANTER FOR FLEXI CONT (MISCELLANEOUS) IMPLANT
BLADE SAW SGTL 18X1.27X75 (BLADE) ×2 IMPLANT
BLADE SURG ROTATE 9660 (MISCELLANEOUS) IMPLANT
CLSR STERI-STRIP ANTIMIC 1/2X4 (GAUZE/BANDAGES/DRESSINGS) ×2 IMPLANT
COVER SURGICAL LIGHT HANDLE (MISCELLANEOUS) ×2 IMPLANT
DRAPE C-ARM 42X72 X-RAY (DRAPES) IMPLANT
DRAPE INCISE IOBAN 66X45 STRL (DRAPES) ×2 IMPLANT
DRAPE ORTHO SPLIT 77X108 STRL (DRAPES) ×2
DRAPE SURG ORHT 6 SPLT 77X108 (DRAPES) ×2 IMPLANT
DRAPE U-SHAPE 47X51 STRL (DRAPES) ×2 IMPLANT
DRSG MEPILEX BORDER 4X8 (GAUZE/BANDAGES/DRESSINGS) ×2 IMPLANT
DURAPREP 26ML APPLICATOR (WOUND CARE) ×2 IMPLANT
ELECT BLADE 4.0 EZ CLEAN MEGAD (MISCELLANEOUS) ×2
ELECT REM PT RETURN 9FT ADLT (ELECTROSURGICAL) ×2
ELECTRODE BLDE 4.0 EZ CLN MEGD (MISCELLANEOUS) ×1 IMPLANT
ELECTRODE REM PT RTRN 9FT ADLT (ELECTROSURGICAL) ×1 IMPLANT
FACESHIELD WRAPAROUND (MASK) ×4 IMPLANT
FEMORAL HEAD LFIT V40 28MM PL0 (Orthopedic Implant) ×2 IMPLANT
GLOVE BIO SURGEON STRL SZ7 (GLOVE) ×2 IMPLANT
GLOVE BIO SURGEON STRL SZ7.5 (GLOVE) ×2 IMPLANT
GLOVE BIOGEL PI IND STRL 7.0 (GLOVE) ×1 IMPLANT
GLOVE BIOGEL PI IND STRL 8 (GLOVE) ×1 IMPLANT
GLOVE BIOGEL PI INDICATOR 7.0 (GLOVE) ×1
GLOVE BIOGEL PI INDICATOR 8 (GLOVE) ×1
GOWN STRL REUS W/ TWL LRG LVL3 (GOWN DISPOSABLE) ×2 IMPLANT
GOWN STRL REUS W/ TWL XL LVL3 (GOWN DISPOSABLE) ×1 IMPLANT
GOWN STRL REUS W/TWL LRG LVL3 (GOWN DISPOSABLE) ×2
GOWN STRL REUS W/TWL XL LVL3 (GOWN DISPOSABLE) ×1
INSERT REST ADM X3 28 28/54 (Insert) ×2 IMPLANT
KIT BASIN OR (CUSTOM PROCEDURE TRAY) ×2 IMPLANT
KIT ROOM TURNOVER OR (KITS) ×2 IMPLANT
LINER MDM LINE 48MM G (Liner) ×2 IMPLANT
MANIFOLD NEPTUNE II (INSTRUMENTS) ×2 IMPLANT
NDL SAFETY ECLIPSE 18X1.5 (NEEDLE) IMPLANT
NEEDLE 18GX1X1/2 (RX/OR ONLY) (NEEDLE) IMPLANT
NEEDLE HYPO 18GX1.5 SHARP (NEEDLE)
NS IRRIG 1000ML POUR BTL (IV SOLUTION) ×2 IMPLANT
PACK TOTAL JOINT (CUSTOM PROCEDURE TRAY) ×2 IMPLANT
PACK UNIVERSAL I (CUSTOM PROCEDURE TRAY) ×2 IMPLANT
PAD ARMBOARD 7.5X6 YLW CONV (MISCELLANEOUS) ×2 IMPLANT
SPONGE LAP 18X18 X RAY DECT (DISPOSABLE) IMPLANT
SUT MNCRL AB 4-0 PS2 18 (SUTURE) ×2 IMPLANT
SUT MON AB 2-0 CT1 36 (SUTURE) ×2 IMPLANT
SUT VIC AB 0 CT1 27 (SUTURE) ×1
SUT VIC AB 0 CT1 27XBRD ANBCTR (SUTURE) ×1 IMPLANT
SUT VIC AB 1 CT1 27 (SUTURE) ×1
SUT VIC AB 1 CT1 27XBRD ANBCTR (SUTURE) ×1 IMPLANT
SYR 50ML LL SCALE MARK (SYRINGE) IMPLANT
SYRINGE 20CC LL (MISCELLANEOUS) IMPLANT
TOWEL OR 17X24 6PK STRL BLUE (TOWEL DISPOSABLE) ×2 IMPLANT
TOWEL OR 17X26 10 PK STRL BLUE (TOWEL DISPOSABLE) ×2 IMPLANT
TRAY FOLEY CATH 16FRSI W/METER (SET/KITS/TRAYS/PACK) IMPLANT
WATER STERILE IRR 1000ML POUR (IV SOLUTION) ×2 IMPLANT

## 2015-01-08 NOTE — Transfer of Care (Signed)
Immediate Anesthesia Transfer of Care Note  Patient: [name]  Procedure(s) Performed: Procedure(s): RIGHT ANTERIOR HIP REVISION (Right)  Patient Location: PACU  Anesthesia Type:MAC and Spinal  Level of Consciousness: awake, oriented, patient cooperative and responds to stimulation  Airway & Oxygen Therapy: Patient Spontanous Breathing  Post-op Assessment: Report given to RN and Post -op Vital signs reviewed and stable  Post vital signs: Reviewed and stable  Last Vitals:  Filed Vitals:   01/08/15 1441  BP: 109/78  Pulse: 86  Temp:   Resp: 13    Complications: No apparent anesthesia complications

## 2015-01-08 NOTE — Discharge Instructions (Signed)
INSTRUCTIONS AFTER JOINT REPLACEMENT   o Remove items at home which could result in a fall. This includes throw rugs or furniture in walking pathways o ICE to the affected joint every three hours while awake for 30 minutes at a time, for at least the first 3-5 days, and then as needed for pain and swelling.  Continue to use ice for pain and swelling. You may notice swelling that will progress down to the foot and ankle.  This is normal after surgery.  Elevate your leg when you are not up walking on it.   o Continue to use the breathing machine you got in the hospital (incentive spirometer) which will help keep your temperature down.  It is common for your temperature to cycle up and down following surgery, especially at night when you are not up moving around and exerting yourself.  The breathing machine keeps your lungs expanded and your temperature down.   DIET:  As you were doing prior to hospitalization, we recommend a well-balanced diet.  DRESSING / WOUND CARE / SHOWERING  Keep the surgical dressing until follow up.  IF THE DRESSING FALLS OFF or the wound gets wet inside, change the dressing with sterile gauze.  Please use good hand washing techniques before changing the dressing.  Do not use any lotions or creams on the incision until instructed by your surgeon.    ACTIVITY  o Increase activity slowly as tolerated, but follow the weight bearing instructions below.   o No driving for 6 weeks or until further direction given by your physician.  You cannot drive while taking narcotics.  o No lifting or carrying greater than 10 lbs. until further directed by your surgeon. o Avoid periods of inactivity such as sitting longer than an hour when not asleep. This helps prevent blood clots.  o You may return to work once you are authorized by your doctor.    WEIGHT BEARING   Weight bearing as tolerated with assist device (walker, cane, etc) as directed, use it as long as suggested by your surgeon  or therapist, typically at least 4-6 weeks. In the abduction brace for 3 weeks   CONSTIPATION  Constipation is defined medically as fewer than three stools per week and severe constipation as less than one stool per week.  Even if you have a regular bowel pattern at home, your normal regimen is likely to be disrupted due to multiple reasons following surgery.  Combination of anesthesia, postoperative narcotics, change in appetite and fluid intake all can affect your bowels.   YOU MUST use at least one of the following options; they are listed in order of increasing strength to get the job done.  They are all available over the counter, and you may need to use some, POSSIBLY even all of these options:    Drink plenty of fluids (prune juice may be helpful) and high fiber foods Colace 100 mg by mouth twice a day  Senokot for constipation as directed and as needed Dulcolax (bisacodyl), take with full glass of water  Miralax (polyethylene glycol) once or twice a day as needed.  If you have tried all these things and are unable to have a bowel movement in the first 3-4 days after surgery call either your surgeon or your primary doctor.    If you experience loose stools or diarrhea, hold the medications until you stool forms back up.  If your symptoms do not get better within 1 week or if they get  worse, check with your doctor.  If you experience "the worst abdominal pain ever" or develop nausea or vomiting, please contact the office immediately for further recommendations for treatment.   ITCHING:  If you experience itching with your medications, try taking only a single pain pill, or even half a pain pill at a time.  You can also use Benadryl over the counter for itching or also to help with sleep.   TED HOSE STOCKINGS:  Use stockings on both legs until for at least 2 weeks or as directed by physician office. They may be removed at night for sleeping.  MEDICATIONS:  See your medication summary on  the After Visit Summary that nursing will review with you.  You may have some home medications which will be placed on hold until you complete the course of blood thinner medication.  It is important for you to complete the blood thinner medication as prescribed.  PRECAUTIONS:  If you experience chest pain or shortness of breath - call 911 immediately for transfer to the hospital emergency department.   If you develop a fever greater that 101 F, purulent drainage from wound, increased redness or drainage from wound, foul odor from the wound/dressing, or calf pain - CONTACT YOUR SURGEON.                                                   FOLLOW-UP APPOINTMENTS:  If you do not already have a post-op appointment, please call the office for an appointment to be seen by your surgeon.  Guidelines for how soon to be seen are listed in your After Visit Summary, but are typically between 1-4 weeks after surgery.  MAKE SURE YOU:   Understand these instructions.   Get help right away if you are not doing well or get worse.    Thank you for letting us be a part of your medical care team.  It is a privilege we respect greatly.  We hope these instructions will help you stay on track for a fast and full recovery!

## 2015-01-08 NOTE — Interval H&P Note (Signed)
History and Physical Interval Note:  01/08/2015 8:24 AM  [name]  has presented today for surgery, with the diagnosis of RECURRENT RIGHT TOTAL HIP DISLOCATION  The various methods of treatment have been discussed with the patient and family. After consideration of risks, benefits and other options for treatment, the patient has consented to  Procedure(s): RIGHT ANTERIOR HIP REVISION (Right) as a surgical intervention .  The patient's history has been reviewed, patient examined, no change in status, stable for surgery.  I have reviewed the patient's chart and labs.  Questions were answered to the patient's satisfaction.     Bran Aldridge D

## 2015-01-08 NOTE — Anesthesia Procedure Notes (Signed)
Spinal Patient location during procedure: OR Start time: 01/08/2015 12:48 PM End time: 01/08/2015 12:52 PM Staffing Anesthesiologist: Roderic Palau Performed by: anesthesiologist  Preanesthetic Checklist Completed: patient identified, surgical consent, pre-op evaluation, timeout performed, IV checked, risks and benefits discussed and monitors and equipment checked Spinal Block Patient position: sitting Prep: DuraPrep Patient monitoring: cardiac monitor, continuous pulse ox and blood pressure Approach: midline Location: L2-3 Injection technique: single-shot Needle Needle type: Quincke  Needle gauge: 22 G Needle length: 9 cm Assessment Sensory level: T6 Additional Notes Functioning IV was confirmed and monitors were applied. Sterile prep and drape, including hand hygiene and sterile gloves were used. The patient was positioned and the spine was prepped. The skin was anesthetized with lidocaine.  Free flow of clear CSF was obtained prior to injecting local anesthetic into the CSF.  The spinal needle aspirated freely following injection.  The needle was carefully withdrawn.  The patient tolerated the procedure well.

## 2015-01-08 NOTE — Op Note (Signed)
01/08/2015  2:16 PM  PATIENT:  [name]    PRE-OPERATIVE DIAGNOSIS:  RECURRENT RIGHT TOTAL HIP DISLOCATION  POST-OPERATIVE DIAGNOSIS:  Same  PROCEDURE:  RIGHT ANTERIOR HIP REVISION  SURGEON:  Delaine Hernandez, Ernesta Amble, MD  ASSISTANT: Lovett Calender, PA-C, She was present and scrubbed throughout the case, critical for completion in a timely fashion, and for retraction, instrumentation, and closure.   ANESTHESIA:   Spinal + sedation  PREOPERATIVE INDICATIONS:  [name] is a  74 y.o. male with a diagnosis of RECURRENT RIGHT TOTAL HIP DISLOCATION who failed conservative measures and elected for surgical management.    The risks benefits and alternatives were discussed with the patient preoperatively including but not limited to the risks of infection, bleeding, nerve injury, cardiopulmonary complications, the need for revision surgery, among others, and the patient was willing to proceed.  OPERATIVE IMPLANTS: MDM head/liner, 0 neck, 28 head, 48G poly/liner   OPERATIVE FINDINGS: stable reduction  BLOOD LOSS: 657  COMPLICATIONS: none  TOURNIQUET TIME: none  OPERATIVE PROCEDURE:  Patient was identified in the preoperative holding area and site was marked by me He was transported to the operating theater and placed on the table in supine position taking care to pad all bony prominences. After a preincinduction time out anesthesia was induced. The right lower extremity was prepped and draped in normal sterile fashion and a pre-incision timeout was performed. He received ancef for preoperative antibiotics.   I made a incision through his previous anterior approach. Identified his tensor fascia and incised this superficially. I then retracted the tensor muscle posteriorly and incised his deep fascia of his tensor muscle. This point identified his anterior neck and scarred in capsule. I elevated soft tissue and scar off of the neck and identified his acetabular cup.  This point I did  send some tissue cultures to confirm no infection.  I then dislocated his hip it still required a fair amount of effort to dislocate.  I remove the ceramic ball. I then used comminution of instruments to remove the polyethylene liner from inside his acetabular shell. Both the acetabular shell and interosseous implant were very stable and well-healed. I then thoroughly irrigated everything with 3 L of saline. Next I inserted the metal MDM liner again irrigating and then placing the head ball articulation on top of the trunnion using a -4 head trialed with that reduction was stable leg was just slightly short I selected a 0 final implant placed that and reduced it his leg was slightly long but it was hip his hip was very stable at this point.  I then thoroughly irrigated his wound placed topical tranexamic acid closed his tensor fascia and skin in layers using absorbable stitch.  Sterile dressing was applied using the PACU in stable condition.  POST OPERATIVE PLAN: Abduction brace for 3 weeks aspirin for DVT prophylaxis weightbearing as tolerated    This note was generated using a template and dragon dictation system. In light of that, I have reviewed the note and all aspects of it are applicable to this case. Any dictation errors are due to the computerized dictation system.

## 2015-01-08 NOTE — Anesthesia Postprocedure Evaluation (Signed)
  Anesthesia Post-op Note  Patient: [name]  Procedure(s) Performed: Procedure(s): RIGHT ANTERIOR HIP REVISION (Right)  Patient Location: PACU  Anesthesia Type: Spinal/MAC  Level of Consciousness: awake and alert   Airway and Oxygen Therapy: Patient Spontanous Breathing  Post-op Pain: Controlled  Post-op Assessment: Post-op Vital signs reviewed, Patient's Cardiovascular Status Stable and Respiratory Function Stable. Block receeding.  Post-op Vital Signs: Reviewed  Filed Vitals:   01/08/15 2033  BP: 120/83  Pulse: 91  Temp: 36.7 C  Resp: 16    Complications: No apparent anesthesia complications

## 2015-01-08 NOTE — H&P (View-Only) (Signed)
PCP is Dr Ernie Hew Denies seeing a cardiologist. States he had a stress test 10-12 years ago- states that everything was fine. Denies ever having a card cath or echo. States he had a sleep study done, but was not sleep apnea- had restless leg syndrome.  Cbg today was 172 Reports his fasting cbgs usually run in the 140's, but has been up some since he has had more pain

## 2015-01-08 NOTE — Anesthesia Preprocedure Evaluation (Addendum)
Anesthesia Evaluation  Patient identified by MRN, date of birth, ID band Patient awake    Reviewed: Allergy & Precautions, H&P , NPO status , Patient's Chart, lab work & pertinent test results  History of Anesthesia Complications (+) PONV  Airway Mallampati: II  TM Distance: >3 FB Neck ROM: Full    Dental no notable dental hx. (+) Teeth Intact, Dental Advisory Given   Pulmonary neg pulmonary ROS,    Pulmonary exam normal breath sounds clear to auscultation       Cardiovascular hypertension, Pt. on medications  Rhythm:Regular Rate:Normal     Neuro/Psych negative neurological ROS  negative psych ROS   GI/Hepatic Neg liver ROS, GERD  Controlled,  Endo/Other  diabetes, Insulin Dependent, Oral Hypoglycemic Agents  Renal/GU Renal InsufficiencyRenal disease  negative genitourinary   Musculoskeletal  (+) Arthritis , Osteoarthritis,    Abdominal   Peds  Hematology negative hematology ROS (+)   Anesthesia Other Findings   Reproductive/Obstetrics negative OB ROS                           Anesthesia Physical Anesthesia Plan  ASA: III  Anesthesia Plan: Spinal   Post-op Pain Management:    Induction: Intravenous  Airway Management Planned: Simple Face Mask  Additional Equipment:   Intra-op Plan:   Post-operative Plan:   Informed Consent: I have reviewed the patients History and Physical, chart, labs and discussed the procedure including the risks, benefits and alternatives for the proposed anesthesia with the patient or authorized representative who has indicated his/her understanding and acceptance.   Dental advisory given  Plan Discussed with: CRNA  Anesthesia Plan Comments:         Anesthesia Quick Evaluation

## 2015-01-09 ENCOUNTER — Encounter (HOSPITAL_COMMUNITY): Payer: Self-pay | Admitting: General Practice

## 2015-01-09 LAB — GLUCOSE, CAPILLARY
Glucose-Capillary: 281 mg/dL — ABNORMAL HIGH (ref 65–99)
Glucose-Capillary: 240 mg/dL — ABNORMAL HIGH (ref 65–99)

## 2015-01-09 NOTE — Evaluation (Signed)
Physical Therapy Evaluation Patient Details Name: [name] MRN: [id number] DOB: [birth year] Today's Date: 01/09/2015   History of Present Illness  74 y.o. male with R DA-THA 11/06/14 admitted with multiple dislocations for DA-THA revision.  Clinical Impression  Pt is independent with mobility using RW. He walked 150' with RW without loss of balance. He reports he's independent with THA exercises and doesn't need HHPT. From PT standpoint he is ready to DC home.     Follow Up Recommendations No PT follow up    Equipment Recommendations  None recommended by PT    Recommendations for Other Services       Precautions / Restrictions Restrictions Weight Bearing Restrictions: No Other Position/Activity Restrictions: wbat      Mobility  Bed Mobility Overal bed mobility: Modified Independent             General bed mobility comments: with rail  Transfers Overall transfer level: Modified independent Equipment used: Rolling walker (2 wheeled)                Ambulation/Gait Ambulation/Gait assistance: Modified independent (Device/Increase time) Ambulation Distance (Feet): 150 Feet Assistive device: Rolling walker (2 wheeled) Gait Pattern/deviations: Step-through pattern        Stairs            Wheelchair Mobility    Modified Rankin (Stroke Patients Only)       Balance Overall balance assessment: Modified Independent                                           Pertinent Vitals/Pain Pain Assessment: 0-10 Pain Score: 3  Pain Location: R hip Pain Descriptors / Indicators: Aching Pain Intervention(s): Limited activity within patient's tolerance;Monitored during session;Premedicated before session;Ice applied    Home Living Family/patient expects to be discharged to:: Private residence Living Arrangements: Spouse/significant other Available Help at Discharge: Family;Available 24 hours/day Type of Home: House Home Access: Ramped  entrance     Home Layout: One level Home Equipment: Toilet riser;Shower seat;Adaptive equipment;Walker - standard;Cane - single point;Bedside commode      Prior Function Level of Independence: Independent with assistive device(s)         Comments: with RW     Hand Dominance   Dominant Hand: Right    Extremity/Trunk Assessment   Upper Extremity Assessment: Overall WFL for tasks assessed           Lower Extremity Assessment: RLE deficits/detail RLE Deficits / Details: knee/ankle wnl, hip -3/5    Cervical / Trunk Assessment: Normal  Communication   Communication: No difficulties  Cognition Arousal/Alertness: Awake/alert Behavior During Therapy: WFL for tasks assessed/performed Overall Cognitive Status: Within Functional Limits for tasks assessed                      General Comments      Exercises Total Joint Exercises Ankle Circles/Pumps: AROM;Both;10 reps Long Arc Quad: AROM;Right;5 reps;Seated      Assessment/Plan    PT Assessment Patent does not need any further PT services  PT Diagnosis Acute pain   PT Problem List    PT Treatment Interventions     PT Goals (Current goals can be found in the Care Plan section) Acute Rehab PT Goals Patient Stated Goal: do yard work PT Goal Formulation: All assessment and education complete, DC therapy Potential to Achieve Goals: Good  Frequency     Barriers to discharge        Co-evaluation               End of Session Equipment Utilized During Treatment: Gait belt Activity Tolerance: Patient tolerated treatment well Patient left: in chair;with call bell/phone within reach Nurse Communication: Mobility status         Time: 1217-1241 PT Time Calculation (min) (ACUTE ONLY): 24 min   Charges:   PT Evaluation $Initial PT Evaluation Tier I: 1 Procedure PT Treatments $Gait Training: 8-22 mins   PT G Codes:        Philomena Doheny 01/09/2015, 1:03 PM [phone]

## 2015-01-09 NOTE — Progress Notes (Signed)
     Subjective:  POD#1 Revision R total hip arthroplasty. Patient reports pain as moderate.  Resting comfortably in bed.   Objective:   VITALS:   Filed Vitals:   01/08/15 1744 01/08/15 2033 01/09/15 0006 01/09/15 0545  BP: 141/85 120/83 134/79 123/70  Pulse: 80 91 100 94  Temp: 97.6 F (36.4 C) 98.1 F (36.7 C) 97.9 F (36.6 C) 97.8 F (36.6 C)  TempSrc: Oral Oral Oral Oral  Resp: 15 16 16 16   Weight:      SpO2: 97% 95% 94% 95%    Neurologically intact ABD soft Neurovascular intact Sensation intact distally Intact pulses distally Dorsiflexion/Plantar flexion intact Incision: dressing C/D/I   Lab Results  Component Value Date   WBC 6.7 01/03/2015   HGB 13.9 01/03/2015   HCT 40.4 01/03/2015   MCV 90.2 01/03/2015   PLT 296 01/03/2015   BMET    Component Value Date/Time   NA 141 01/03/2015 1456   K 3.7 01/03/2015 1456   CL 106 01/03/2015 1456   CO2 25 01/03/2015 1456   GLUCOSE 216* 01/03/2015 1456   BUN 18 01/03/2015 1456   CREATININE 1.31* 01/03/2015 1456   CALCIUM 9.9 01/03/2015 1456   GFRNONAA 52* 01/03/2015 1456   GFRAA >60 01/03/2015 1456     Assessment/Plan: 1 Day Post-Op   Active Problems:   DJD (degenerative joint disease)   Up with therapy WBAT in the RLE in the abduction brace at all times ASA 329mf daily for dvt prophylaxis Will see how he mobilizes with PT.  May be able to discharge home today.   [name] [name] 01/09/2015, 8:12 AM Cell [phone]

## 2015-01-10 DIAGNOSIS — Z96649 Presence of unspecified artificial hip joint: Secondary | ICD-10-CM

## 2015-01-10 NOTE — Discharge Summary (Signed)
Physician Discharge Summary  Patient ID: TABOR DENHAM MRN: [id number] DOB/AGE: 08/08/1940 74 y.o.  Admit date: 01/08/2015 Discharge date: 01/10/2015  Admission Diagnoses:  S/P revision of total hip  Discharge Diagnoses:  Principal Problem:   S/P revision of total hip Active Problems:   DJD (degenerative joint disease)   Past Medical History  Diagnosis Date  . Kidney stones   . Hypertension   . Diabetes mellitus type 2 in obese ([place])   . PONV (postoperative nausea and vomiting)   . GERD (gastroesophageal reflux disease)     occ indigestion  . Arthritis     arthirtis all over  . Neuromuscular disorder (HCC)     restless leg syndrome  . Right knee DJD   . Hyperlipemia   . Frequent urination at night   . Primary osteoarthritis of right knee   . Primary localized osteoarthritis of right hip   . Spinal stenosis of lumbar region with radiculopathy   . PONV (postoperative nausea and vomiting)   . Glaucoma     both eyes  . Restless leg syndrome     Surgeries: Procedure(s): RIGHT ANTERIOR HIP REVISION on 01/08/2015   Consultants (if any):    Discharged Condition: Improved  Hospital Course: DESMOND SZABO is an 74 y.o. male who was admitted 01/08/2015 with a diagnosis of S/P revision of total hip and went to the operating room on 01/08/2015 and underwent the above named procedures.    He was given perioperative antibiotics:      Anti-infectives    Start     Dose/Rate Route Frequency Ordered Stop   01/08/15 1800  ceFAZolin (ANCEF) IVPB 2 g/50 mL premix     2 g 100 mL/hr over 30 Minutes Intravenous Every 6 hours 01/08/15 1756 01/09/15 0559   01/08/15 1100  ceFAZolin (ANCEF) IVPB 2 g/50 mL premix     2 g 100 mL/hr over 30 Minutes Intravenous To [facility] 01/08/15 1053 01/08/15 1300    .  He was given sequential compression devices, early ambulation, and ASA 325mg  for DVT prophylaxis.  He benefited maximally from the hospital stay and there were no  complications.    Recent vital signs:  Filed Vitals:   01/09/15 0545  BP: 123/70  Pulse: 94  Temp: 97.8 F (36.6 C)  Resp: 16    Recent laboratory studies:  Lab Results  Component Value Date   HGB 13.9 01/03/2015   HGB 13.6 12/30/2014   HGB 12.3* 11/28/2014   Lab Results  Component Value Date   WBC 6.7 01/03/2015   PLT 296 01/03/2015   Lab Results  Component Value Date   INR 1.10 10/25/2014   Lab Results  Component Value Date   NA 141 01/03/2015   K 3.7 01/03/2015   CL 106 01/03/2015   CO2 25 01/03/2015   BUN 18 01/03/2015   CREATININE 1.31* 01/03/2015   GLUCOSE 216* 01/03/2015    Discharge Medications:     Medication List    TAKE these medications        allopurinol 300 MG tablet  Commonly known as:  ZYLOPRIM  Take 150 mg by mouth every morning. Take half a tablet (150mg ) by mouth daily     amLODipine 10 MG tablet  Commonly known as:  NORVASC  Take 10 mg by mouth daily.     aspirin 325 MG tablet  Take 1 tablet (325 mg total) by mouth daily.     atorvastatin 10 MG tablet  Commonly known  as:  LIPITOR  Take 10 mg by mouth at bedtime.     DSS 100 MG Caps  Take 100 mg by mouth 2 (two) times daily.     Fish Oil 1000 MG Caps  Take 1 capsule by mouth daily.     gemfibrozil 600 MG tablet  Commonly known as:  LOPID  Take 600 mg by mouth 2 (two) times daily.     glipiZIDE 10 MG tablet  Commonly known as:  GLUCOTROL  Take 10 mg by mouth 2 (two) times daily before a meal.     HYDROcodone-acetaminophen 5-325 MG tablet  Commonly known as:  NORCO  Take 1-2 tablets by mouth every 6 (six) hours as needed for moderate pain.     insulin glargine 100 UNIT/ML injection  Commonly known as:  LANTUS  Inject 70 Units into the skin once.     metFORMIN 500 MG tablet  Commonly known as:  GLUCOPHAGE  Take 500 mg by mouth 2 (two) times daily with a meal.     methocarbamol 500 MG tablet  Commonly known as:  ROBAXIN  Take 1 tablet (500 mg total) by mouth 4  (four) times daily.     ondansetron 4 MG tablet  Commonly known as:  ZOFRAN  Take 1 tablet (4 mg total) by mouth every 8 (eight) hours as needed for nausea or vomiting.     polyethylene glycol packet  Commonly known as:  MIRALAX / GLYCOLAX  Take 17 g by mouth 2 (two) times daily.     tamsulosin 0.4 MG Caps capsule  Commonly known as:  FLOMAX  Take 0.4 mg by mouth at bedtime.     vitamin C 500 MG tablet  Commonly known as:  ASCORBIC ACID  Take 500 mg by mouth daily.     Vitamin D-3 5000 UNITS Tabs  Take 5,000 Units by mouth daily.     VITRUM 50+ SENIOR MULTI PO  Take 1 tablet by mouth daily.        Diagnostic Studies: Dg Pelvis Portable  01/08/2015  CLINICAL DATA:  Status post right hip replacement EXAM: PORTABLE PELVIS 1-2 VIEWS COMPARISON:  11/06/2014 FINDINGS: Right hip replacement is noted. No acute abnormality is seen. No definitive loosening is noted. Pelvic ring as visualized is intact. IMPRESSION: Status post right hip revision no acute abnormality noted. Electronically Signed   By: Inez Catalina M.D.   On: 01/08/2015 15:24   Dg Hip Port Unilat With Pelvis 1v Right  12/30/2014  CLINICAL DATA:  Status post right hip dislocation EXAM: DG HIP (WITH OR WITHOUT PELVIS) 1V PORT RIGHT COMPARISON:  X-ray earlier same day FINDINGS: Right total hip arthroplasty with satisfactory reduction of right hip dislocation. No acute fracture. No hardware failure or complication. IMPRESSION: Interval satisfactory reduction of right hip dislocation. Electronically Signed   By: Kathreen Devoid   On: 12/30/2014 19:55   Dg Hip Unilat  With Pelvis 2-3 Views Right  12/30/2014  CLINICAL DATA:  Right hip pain. Replacement and dislocation of replacement x2. EXAM: DG HIP (WITH OR WITHOUT PELVIS) 2-3V RIGHT COMPARISON:  11/28/2014 FINDINGS: Lumbar spine fixation. Superolateral dislocation of the femoral component of the right hip arthroplasty. No complicating fracture. IMPRESSION: Right hip arthroplasty with  super lateral dislocation of the femoral component. Electronically Signed   By: Abigail Miyamoto M.D.   On: 12/30/2014 15:45    Disposition: 01-Home or Self Care  Discharge Instructions    Weight bearing as tolerated    Complete by:  As directed   Laterality:  right  Extremity:  Lower  In the hip abduction brace for 3 weeks           Follow-up Information    Follow up with MURPHY, TIMOTHY D, MD In 10 days.   Specialty:  Orthopedic Surgery   Contact information:   [address] [address] [phone]        Signed: Gae Dry 01/10/2015, 11:12 AM Cell [phone]

## 2015-01-11 LAB — WOUND CULTURE
Culture: NO GROWTH
Gram Stain: NONE SEEN

## 2015-01-12 LAB — TISSUE CULTURE: Culture: NO GROWTH

## 2015-01-13 LAB — ANAEROBIC CULTURE: Gram Stain: NONE SEEN

## 2015-02-08 NOTE — Patient Outreach (Addendum)
[place] [facility]) Care Management  02/08/2015  [name] [birth year] [id number]   Referral from HTA tier 3 list, assigned Jon Billings, RN for patient outreach.  Aylssa Herrig L. Kamori Kitchens, [place] Care Management Assistant

## 2015-02-11 ENCOUNTER — Other Ambulatory Visit: Payer: Self-pay

## 2015-02-11 NOTE — Patient Outreach (Signed)
[place] [facility]) Care Management  02/11/2015  [name] [birth year] [id number]  Referral Date: 02-08-15 Referral Source: HTA Tier 3 Referral Reason: Diabetes type 2 with 3 ED visits and 2 admits Outreach Attempt: First Attempt successful  Social: Patient reports he lives with his wife who assists.  Patient reports he is independent with activities of daily living.    Conditions: Patient admits to Type 2 diabetes and states "He is trying to control it and he just needs to follow his diet" Patient declines need for health coach or community nurse for education. Patient reports he recently had a right hip replacement and that less than a month ago he had to go in for the doctor to fix it as it was not working correctly.  He states since then he is doing well and doing the exercises given to him to do.    Medications:  Patient has no problems with his medications. Patient states he is able to afford medications.   Services: Patient declined [place] at this time as he knows what he needs to do.    Plan: RN Health Coach will send letter and brochure for patient to review.   RN Health Coach will send in basket to [place], Care Management Assistant for case closure.    Jone Baseman, RN, MSN [place] [phone]

## 2015-02-26 ENCOUNTER — Encounter: Payer: Self-pay | Admitting: Family Medicine

## 2015-02-26 ENCOUNTER — Ambulatory Visit (INDEPENDENT_AMBULATORY_CARE_PROVIDER_SITE_OTHER): Payer: PPO | Admitting: Family Medicine

## 2015-02-26 VITALS — BP 142/88 | HR 96 | Temp 98.4°F | Wt 255.5 lb

## 2015-02-26 DIAGNOSIS — E119 Type 2 diabetes mellitus without complications: Secondary | ICD-10-CM | POA: Diagnosis not present

## 2015-02-26 DIAGNOSIS — E669 Obesity, unspecified: Secondary | ICD-10-CM

## 2015-02-26 DIAGNOSIS — E1169 Type 2 diabetes mellitus with other specified complication: Secondary | ICD-10-CM

## 2015-02-26 MED ORDER — GLIPIZIDE 10 MG PO TABS
10.0000 mg | ORAL_TABLET | Freq: Every day | ORAL | Status: DC
Start: 2015-02-26 — End: 2015-11-28

## 2015-02-26 MED ORDER — INSULIN GLARGINE 100 UNIT/ML ~~LOC~~ SOLN: 75.0000 [IU] | Freq: Once | SUBCUTANEOUS | Status: DC

## 2015-02-26 NOTE — Patient Instructions (Signed)
Go to the lab on the way out.  We'll contact you with your lab report. 1 insulin shot a day.  1 glipizide a day.  1 metformin twice a day.  We'll be in touch when I see your labs and old records.   Update me in about your AM sugars in a few days, with the meds as above.  Take care.  Glad to see you.

## 2015-02-26 NOTE — Progress Notes (Signed)
Pre visit review using our clinic review tool, if applicable. No additional management support is needed unless otherwise documented below in the visit note.  New patient.  Requesting records.  Per patient, vaccines hx up to date.   Dm2.  Had increasing A1c, with mult med changes done by prior MD.  Compliant with meds, but had been taken off glipizide prev.  Had been having AM sugars >180, with elevations higher later in the day.  No lows.  Working on diet.  Some tinging in the foot, DM2 neuropathy.   PMH and SH reviewed  ROS: See HPI, otherwise noncontributory.  Meds, vitals, and allergies reviewed.   GEN: nad, alert and oriented HEENT: mucous membranes moist NECK: supple w/o LA CV: rrr.  PULM: ctab, no inc wob ABD: soft, +bs EXT: no edema SKIN: no acute rash  Diabetic foot exam: Normal inspection No skin breakdown Callus noted on plantar L 1st distal MT Normal DP pulses Normal sensation to light touch and monofilament Nails normal

## 2015-02-27 LAB — HEMOGLOBIN A1C: Hgb A1c MFr Bld: 8.3 % — ABNORMAL HIGH (ref 4.6–6.5)

## 2015-02-27 LAB — COMPREHENSIVE METABOLIC PANEL
ALT: 25 U/L (ref 0–53)
AST: 21 U/L (ref 0–37)
Albumin: 4.3 g/dL (ref 3.5–5.2)
Alkaline Phosphatase: 49 U/L (ref 39–117)
BUN: 29 mg/dL — ABNORMAL HIGH (ref 6–23)
CO2: 28 mEq/L (ref 19–32)
Calcium: 10.6 mg/dL — ABNORMAL HIGH (ref 8.4–10.5)
Chloride: 101 mEq/L (ref 96–112)
Creatinine, Ser: 1.29 mg/dL (ref 0.40–1.50)
GFR: 57.87 mL/min — ABNORMAL LOW (ref >60.00)
Glucose, Bld: 132 mg/dL — ABNORMAL HIGH (ref 70–99)
Potassium: 4.1 mEq/L (ref 3.5–5.1)
Sodium: 139 mEq/L (ref 135–145)
Total Bilirubin: 0.4 mg/dL (ref 0.2–1.2)
Total Protein: 8.1 g/dL (ref 6.0–8.3)

## 2015-02-28 ENCOUNTER — Telehealth: Payer: Self-pay | Admitting: Family Medicine

## 2015-02-28 ENCOUNTER — Encounter: Payer: Self-pay | Admitting: Family Medicine

## 2015-02-28 NOTE — Telephone Encounter (Signed)
Pt came in to sign medical records release for Dr. Damita Dunnings and was requesting call back about labs.  CB: [phone]

## 2015-02-28 NOTE — Assessment & Plan Note (Signed)
At this point, continue with  75 units insulin a day.  10mg   glipizide a day.  500mg  metformin twice a day.  We'll be in touch when I see his labs and old records.  He'll update me about AM sugars in a few days, with the meds as above, ie with consistent glipizide use.   >30 minutes spent in face to face time with patient, >50% spent in counselling or coordination of care Okay for outpatient f/u.  Will address health maintenance when records come in.

## 2015-02-28 NOTE — Telephone Encounter (Signed)
Done

## 2015-03-05 ENCOUNTER — Telehealth: Payer: Self-pay | Admitting: Family Medicine

## 2015-03-05 NOTE — Telephone Encounter (Signed)
Pt dropped off blood sugar readings he has done for the past few days. He states he switched to the old medication of metformin that he had half way through the readings, you will see the numbers dropping. The best number to reach pt is [phone]. Placing readings on chart to be dropped off to Dr. Damita Dunnings.

## 2015-03-06 MED ORDER — INSULIN GLARGINE 100 UNIT/ML ~~LOC~~ SOLN: 75.0000 [IU] | Freq: Once | SUBCUTANEOUS | Status: DC

## 2015-03-06 NOTE — Telephone Encounter (Signed)
I looked at his numbers.   I would only change 1 thing at this point.  I would adjust his dose of insulin- add 1 unit per day if AM insulin is >150.  Currently with 75 units a day.  If fasting AM sugar >150, then inc to 76, continue with 1 unit per day inc until sugar below 150.  I think he'll likely not need much adjustment.   Since his A1c was better on this last check, I would not change anything else and recheck A1c before a visit in about 3 months.  Thanks.

## 2015-03-07 NOTE — Telephone Encounter (Signed)
Patient advised.   Patient states he has an appt on Monday but will certainly start this regimen in the meantime.

## 2015-03-11 ENCOUNTER — Ambulatory Visit (INDEPENDENT_AMBULATORY_CARE_PROVIDER_SITE_OTHER): Payer: PPO | Admitting: Family Medicine

## 2015-03-11 ENCOUNTER — Encounter: Payer: Self-pay | Admitting: Family Medicine

## 2015-03-11 VITALS — BP 134/80 | HR 80 | Temp 97.6°F | Wt 245.5 lb

## 2015-03-11 DIAGNOSIS — J3489 Other specified disorders of nose and nasal sinuses: Secondary | ICD-10-CM | POA: Diagnosis not present

## 2015-03-11 DIAGNOSIS — E669 Obesity, unspecified: Secondary | ICD-10-CM | POA: Diagnosis not present

## 2015-03-11 DIAGNOSIS — E1169 Type 2 diabetes mellitus with other specified complication: Secondary | ICD-10-CM

## 2015-03-11 DIAGNOSIS — E119 Type 2 diabetes mellitus without complications: Secondary | ICD-10-CM | POA: Diagnosis not present

## 2015-03-11 MED ORDER — INSULIN GLARGINE 100 UNIT/ML ~~LOC~~ SOLN: SUBCUTANEOUS | Status: DC

## 2015-03-11 MED ORDER — LORATADINE 10 MG PO TABS: 10.0000 mg | ORAL_TABLET | Freq: Every day | ORAL | Status: DC

## 2015-03-11 MED ORDER — METFORMIN HCL 500 MG PO TABS: 500.0000 mg | ORAL_TABLET | Freq: Two times a day (BID) | ORAL | Status: DC

## 2015-03-11 MED ORDER — ATORVASTATIN CALCIUM 10 MG PO TABS: 10.0000 mg | ORAL_TABLET | Freq: Every day | ORAL | Status: DC

## 2015-03-11 NOTE — Patient Instructions (Signed)
Change to regular metformin.  Taper insulin as needed. You can drop a unit per day if AM sugar is <100.  Try taking plain claritin daily.   Recheck labs in about 3 months before a visit.  Take care.  Glad to see you.

## 2015-03-11 NOTE — Progress Notes (Signed)
Pre visit review using our clinic review tool, if applicable. No additional management support is needed unless otherwise documented below in the visit note.  DM2.  He is going to work on diet.  D/w pt about meds and insulin titration.  He is up to 78 units, recently with slight increase.  He wanted to switch back to regular metformin, from extended release.  This is reasonable.  He needs refill on lipitor.    Recent A1c d/w pt.  Improved from last check, but not controlled.   Flu shot prev done 2016.    Meds, vitals, and allergies reviewed.   ROS: See HPI.  Otherwise, noncontributory.  GEN: nad, alert and oriented HEENT: mucous membranes moist, nasal exam wnl except for clear rhinorrhea.   OP wnl NECK: supple w/o LA CV: rrr. PULM: ctab, no inc wob ABD: soft, +bs EXT: no edema SKIN: no acute rash

## 2015-03-13 DIAGNOSIS — J3489 Other specified disorders of nose and nasal sinuses: Secondary | ICD-10-CM | POA: Insufficient documentation

## 2015-03-13 NOTE — Assessment & Plan Note (Signed)
D/w pt re: eat right diet and low carb choices.  He'll use 100 and 150 as the cut points for insulin.  Can dec 1 unit a day if AM sugar <100. Can add 1 unit a day if AM sugar >150.  With diet changes, he may be able to dec A1c and insulin and weight concurrently.  He agrees.  Update me as needed.

## 2015-03-13 NOTE — Assessment & Plan Note (Signed)
Can try plain claritin and f/u prn.  Nontoxic. He agrees.

## 2015-04-04 DIAGNOSIS — H10413 Chronic giant papillary conjunctivitis, bilateral: Secondary | ICD-10-CM | POA: Diagnosis not present

## 2015-04-04 DIAGNOSIS — H401123 Primary open-angle glaucoma, left eye, severe stage: Secondary | ICD-10-CM | POA: Diagnosis not present

## 2015-04-04 DIAGNOSIS — H2513 Age-related nuclear cataract, bilateral: Secondary | ICD-10-CM | POA: Diagnosis not present

## 2015-04-04 DIAGNOSIS — H04123 Dry eye syndrome of bilateral lacrimal glands: Secondary | ICD-10-CM | POA: Diagnosis not present

## 2015-04-04 DIAGNOSIS — H401111 Primary open-angle glaucoma, right eye, mild stage: Secondary | ICD-10-CM | POA: Diagnosis not present

## 2015-04-04 DIAGNOSIS — E119 Type 2 diabetes mellitus without complications: Secondary | ICD-10-CM | POA: Diagnosis not present

## 2015-04-04 LAB — HM DIABETES EYE EXAM

## 2015-04-08 DIAGNOSIS — T84029D Dislocation of unspecified internal joint prosthesis, subsequent encounter: Secondary | ICD-10-CM | POA: Diagnosis not present

## 2015-04-18 ENCOUNTER — Encounter: Payer: Self-pay | Admitting: Family Medicine

## 2015-04-21 ENCOUNTER — Encounter: Payer: Self-pay | Admitting: Family Medicine

## 2015-04-22 ENCOUNTER — Encounter: Payer: Self-pay | Admitting: Family Medicine

## 2015-04-22 LAB — HDL CHOLESTEROL
A1c: 8.6
ALT: 15
AST: 16 U/L
Cholesterol, Total: 211
Creatinine, Ser: 1.44
Glucose: 274
HDL: 41 mg/dL (ref 35–70)
Triglycerides: 413

## 2015-04-22 LAB — LDL CHOLESTEROL, DIRECT
A1c: 7.3
ALT: 28
AST: 18 U/L
Cholesterol, Total: 191
Creatinine, Ser: 1.32
Glucose: 213
HDL: 42 mg/dL (ref 35–70)
LDL Calculated: 82 mg/dL
Triglycerides: 334

## 2015-04-26 ENCOUNTER — Encounter: Payer: Self-pay | Admitting: Family Medicine

## 2015-05-09 ENCOUNTER — Other Ambulatory Visit: Payer: Self-pay | Admitting: *Deleted

## 2015-05-09 MED ORDER — AMLODIPINE BESYLATE 10 MG PO TABS: 10.0000 mg | ORAL_TABLET | Freq: Every day | ORAL | Status: DC

## 2015-05-28 ENCOUNTER — Other Ambulatory Visit: Payer: Self-pay | Admitting: Family Medicine

## 2015-05-28 DIAGNOSIS — E1169 Type 2 diabetes mellitus with other specified complication: Secondary | ICD-10-CM

## 2015-05-28 DIAGNOSIS — E669 Obesity, unspecified: Principal | ICD-10-CM

## 2015-06-04 ENCOUNTER — Other Ambulatory Visit (INDEPENDENT_AMBULATORY_CARE_PROVIDER_SITE_OTHER): Payer: PPO

## 2015-06-04 DIAGNOSIS — E1169 Type 2 diabetes mellitus with other specified complication: Secondary | ICD-10-CM

## 2015-06-04 DIAGNOSIS — E119 Type 2 diabetes mellitus without complications: Secondary | ICD-10-CM

## 2015-06-04 DIAGNOSIS — E669 Obesity, unspecified: Secondary | ICD-10-CM

## 2015-06-04 LAB — HEMOGLOBIN A1C: Hgb A1c MFr Bld: 7.4 % — ABNORMAL HIGH (ref 4.6–6.5)

## 2015-06-11 ENCOUNTER — Ambulatory Visit (INDEPENDENT_AMBULATORY_CARE_PROVIDER_SITE_OTHER): Payer: PPO | Admitting: Family Medicine

## 2015-06-11 ENCOUNTER — Encounter: Payer: Self-pay | Admitting: Family Medicine

## 2015-06-11 VITALS — BP 138/92 | HR 92 | Temp 97.8°F | Wt 269.0 lb

## 2015-06-11 DIAGNOSIS — E1169 Type 2 diabetes mellitus with other specified complication: Secondary | ICD-10-CM

## 2015-06-11 DIAGNOSIS — E669 Obesity, unspecified: Secondary | ICD-10-CM

## 2015-06-11 DIAGNOSIS — N529 Male erectile dysfunction, unspecified: Secondary | ICD-10-CM | POA: Insufficient documentation

## 2015-06-11 DIAGNOSIS — E119 Type 2 diabetes mellitus without complications: Secondary | ICD-10-CM

## 2015-06-11 MED ORDER — SILDENAFIL CITRATE 20 MG PO TABS: 60.0000 mg | ORAL_TABLET | Freq: Every day | ORAL | Status: DC | PRN

## 2015-06-11 MED ORDER — INSULIN GLARGINE 100 UNIT/ML ~~LOC~~ SOLN: 55.0000 [IU] | Freq: Two times a day (BID) | SUBCUTANEOUS | Status: DC

## 2015-06-11 NOTE — Patient Instructions (Signed)
Try the split dose of insulin.  See if basaglar is cheaper.  Let me know.   I'll await the ortho notes in the meantime.  Take care.  Glad to see you.  Recheck in about 3 months, sooner if needed.

## 2015-06-11 NOTE — Assessment & Plan Note (Signed)
Okay to try 60-100 mg of sildenafil with routine cautions.

## 2015-06-11 NOTE — Progress Notes (Signed)
Pre visit review using our clinic review tool, if applicable. No additional management support is needed unless otherwise documented below in the visit note.  DM2 f/u.  Exercise limited by R hip pain- f/u with ortho pending.   He is trying to exercise through the pain.   A1c better, no lows.  Labs d/w pt.   He feels some better with better sugar control.   D/w pt about split dose of insulin given his high insulin dose.   He's been working on diet in the meantime.    ED for the last several years.  Asking about options.  No contraindication to viagra use with routine cautions d/w pt.   Meds, vitals, and allergies reviewed.   ROS: See HPI.  Otherwise, noncontributory.  GEN: nad, alert and oriented HEENT: mucous membranes moist NECK: supple w/o LA CV: rrr PULM: ctab, no inc wob ABD: soft, +bs EXT: no edema

## 2015-06-11 NOTE — Assessment & Plan Note (Signed)
A1c better, no lows.  Lab d/w pt.   He feels some better with better sugar control.   D/w pt about split dose of insulin given his high insulin dose.  This is reasonable to try.  I think his sugar will improve with exercise, depending on input from ortho.   I'll await ortho notes.   Patient agrees.  Recheck in about 3 months.

## 2015-06-14 DIAGNOSIS — T84029D Dislocation of unspecified internal joint prosthesis, subsequent encounter: Secondary | ICD-10-CM | POA: Diagnosis not present

## 2015-08-05 ENCOUNTER — Other Ambulatory Visit: Payer: Self-pay | Admitting: *Deleted

## 2015-08-05 NOTE — Telephone Encounter (Signed)
Faxed refill request.  Last Filled:   ?  Request says:  Take 1/2 tablet by mouth once daily.  Our meds list says:  Take 1 tablet by mouth daily AND take 1/2 tablet by mouth daily.  Please clarify.

## 2015-08-05 NOTE — Telephone Encounter (Signed)
Please verify with patient.  I thought he was on 1/2 tab daily.  Thanks.

## 2015-08-06 MED ORDER — ALLOPURINOL 300 MG PO TABS: 150.0000 mg | ORAL_TABLET | ORAL | Status: DC

## 2015-08-06 NOTE — Telephone Encounter (Signed)
Sent. Thanks.   

## 2015-08-06 NOTE — Telephone Encounter (Signed)
Patient is taking 0.5 (one half) tablet each morning.

## 2015-09-05 ENCOUNTER — Other Ambulatory Visit: Payer: PPO

## 2015-09-12 ENCOUNTER — Ambulatory Visit: Payer: PPO

## 2015-09-12 ENCOUNTER — Ambulatory Visit: Payer: PPO | Admitting: Family Medicine

## 2015-09-23 ENCOUNTER — Emergency Department (HOSPITAL_COMMUNITY): Payer: PPO

## 2015-09-23 ENCOUNTER — Other Ambulatory Visit: Payer: Self-pay

## 2015-09-23 ENCOUNTER — Encounter (HOSPITAL_COMMUNITY): Payer: Self-pay | Admitting: Emergency Medicine

## 2015-09-23 DIAGNOSIS — Z794 Long term (current) use of insulin: Secondary | ICD-10-CM | POA: Diagnosis not present

## 2015-09-23 DIAGNOSIS — H4922 Sixth [abducent] nerve palsy, left eye: Secondary | ICD-10-CM | POA: Diagnosis not present

## 2015-09-23 DIAGNOSIS — I10 Essential (primary) hypertension: Secondary | ICD-10-CM | POA: Insufficient documentation

## 2015-09-23 DIAGNOSIS — Z79899 Other long term (current) drug therapy: Secondary | ICD-10-CM | POA: Insufficient documentation

## 2015-09-23 DIAGNOSIS — Z96652 Presence of left artificial knee joint: Secondary | ICD-10-CM | POA: Insufficient documentation

## 2015-09-23 DIAGNOSIS — R791 Abnormal coagulation profile: Secondary | ICD-10-CM | POA: Diagnosis not present

## 2015-09-23 DIAGNOSIS — E1165 Type 2 diabetes mellitus with hyperglycemia: Secondary | ICD-10-CM | POA: Insufficient documentation

## 2015-09-23 DIAGNOSIS — Z7982 Long term (current) use of aspirin: Secondary | ICD-10-CM | POA: Insufficient documentation

## 2015-09-23 DIAGNOSIS — Z7984 Long term (current) use of oral hypoglycemic drugs: Secondary | ICD-10-CM | POA: Insufficient documentation

## 2015-09-23 DIAGNOSIS — Z96641 Presence of right artificial hip joint: Secondary | ICD-10-CM | POA: Insufficient documentation

## 2015-09-23 DIAGNOSIS — H538 Other visual disturbances: Secondary | ICD-10-CM | POA: Diagnosis present

## 2015-09-23 DIAGNOSIS — H532 Diplopia: Secondary | ICD-10-CM | POA: Diagnosis not present

## 2015-09-23 LAB — DIFFERENTIAL
Basophils Absolute: 0 10*3/uL (ref 0.0–0.1)
Basophils Relative: 1 %
Eosinophils Absolute: 0.2 10*3/uL (ref 0.0–0.7)
Eosinophils Relative: 4 %
Lymphocytes Relative: 22 %
Lymphs Abs: 1.5 10*3/uL (ref 0.7–4.0)
Monocytes Absolute: 0.6 10*3/uL (ref 0.1–1.0)
Monocytes Relative: 8 %
Neutro Abs: 4.4 10*3/uL (ref 1.7–7.7)
Neutrophils Relative %: 65 %

## 2015-09-23 LAB — COMPREHENSIVE METABOLIC PANEL
ALT: 37 U/L (ref 17–63)
AST: 47 U/L — ABNORMAL HIGH (ref 15–41)
Albumin: 3.9 g/dL (ref 3.5–5.0)
Alkaline Phosphatase: 46 U/L (ref 38–126)
Anion gap: 13 (ref 5–15)
BUN: 26 mg/dL — ABNORMAL HIGH (ref 6–20)
CO2: 19 mmol/L — ABNORMAL LOW (ref 22–32)
Calcium: 9.9 mg/dL (ref 8.9–10.3)
Chloride: 104 mmol/L (ref 101–111)
Creatinine, Ser: 1.42 mg/dL — ABNORMAL HIGH (ref 0.61–1.24)
GFR calc Af Amer: 55 mL/min — ABNORMAL LOW (ref >60)
GFR calc non Af Amer: 47 mL/min — ABNORMAL LOW (ref >60)
Glucose, Bld: 233 mg/dL — ABNORMAL HIGH (ref 65–99)
Potassium: 4.2 mmol/L (ref 3.5–5.1)
Sodium: 136 mmol/L (ref 135–145)
Total Bilirubin: 1.4 mg/dL — ABNORMAL HIGH (ref 0.3–1.2)
Total Protein: 7.5 g/dL (ref 6.5–8.1)

## 2015-09-23 LAB — PROTIME-INR
INR: 1.08 (ref 0.00–1.49)
Prothrombin Time: 14.2 seconds (ref 11.6–15.2)

## 2015-09-23 LAB — I-STAT CHEM 8, ED
BUN: 36 mg/dL — ABNORMAL HIGH (ref 6–20)
Calcium, Ion: 1.21 mmol/L (ref 1.13–1.30)
Chloride: 104 mmol/L (ref 101–111)
Creatinine, Ser: 1.3 mg/dL — ABNORMAL HIGH (ref 0.61–1.24)
Glucose, Bld: 243 mg/dL — ABNORMAL HIGH (ref 65–99)
HCT: 46 % (ref 39.0–52.0)
Hemoglobin: 15.6 g/dL (ref 13.0–17.0)
Potassium: 4.1 mmol/L (ref 3.5–5.1)
Sodium: 139 mmol/L (ref 135–145)
TCO2: 26 mmol/L (ref 0–100)

## 2015-09-23 LAB — CBC
HCT: 44.3 % (ref 39.0–52.0)
Hemoglobin: 16.1 g/dL (ref 13.0–17.0)
MCH: 32 pg (ref 26.0–34.0)
MCHC: 36.3 g/dL — ABNORMAL HIGH (ref 30.0–36.0)
MCV: 88.1 fL (ref 78.0–100.0)
Platelets: 299 10*3/uL (ref 150–400)
RBC: 5.03 MIL/uL (ref 4.22–5.81)
RDW: 14.1 % (ref 11.5–15.5)
WBC: 6.8 10*3/uL (ref 4.0–10.5)

## 2015-09-23 LAB — I-STAT TROPONIN, ED: Troponin i, poc: 0 ng/mL (ref 0.00–0.08)

## 2015-09-23 LAB — APTT: aPTT: 45 seconds — ABNORMAL HIGH (ref 24–37)

## 2015-09-23 NOTE — ED Notes (Signed)
Pt presents to ED for assessment for assessment of htn (160/94 on home machine), blurred vision (woke up to today), and hyperglycemia (304 on machine at home).  Denies weakness in any limbs.

## 2015-09-24 ENCOUNTER — Telehealth: Payer: Self-pay

## 2015-09-24 ENCOUNTER — Emergency Department (HOSPITAL_COMMUNITY): Payer: PPO

## 2015-09-24 ENCOUNTER — Emergency Department (HOSPITAL_COMMUNITY)
Admission: EM | Admit: 2015-09-24 | Discharge: 2015-09-24 | Disposition: A | Discharge status: Home / Self Care | Payer: PPO | Attending: Emergency Medicine | Admitting: Emergency Medicine

## 2015-09-24 DIAGNOSIS — H532 Diplopia: Secondary | ICD-10-CM | POA: Diagnosis not present

## 2015-09-24 DIAGNOSIS — H4922 Sixth [abducent] nerve palsy, left eye: Secondary | ICD-10-CM

## 2015-09-24 LAB — URINALYSIS, ROUTINE W REFLEX MICROSCOPIC
Bilirubin Urine: NEGATIVE
Glucose, UA: NEGATIVE mg/dL
Ketones, ur: NEGATIVE mg/dL
Leukocytes, UA: NEGATIVE
Nitrite: NEGATIVE
Protein, ur: 30 mg/dL — AB
Specific Gravity, Urine: 1.022 (ref 1.005–1.030)
pH: 5 (ref 5.0–8.0)

## 2015-09-24 LAB — I-STAT TROPONIN, ED: Troponin i, poc: 0.01 ng/mL (ref 0.00–0.08)

## 2015-09-24 LAB — RAPID URINE DRUG SCREEN, HOSP PERFORMED
Amphetamines: NOT DETECTED
Barbiturates: NOT DETECTED
Benzodiazepines: NOT DETECTED
Cocaine: NOT DETECTED
Opiates: NOT DETECTED
Tetrahydrocannabinol: NOT DETECTED

## 2015-09-24 LAB — I-STAT CHEM 8, ED
BUN: 26 mg/dL — ABNORMAL HIGH (ref 6–20)
Calcium, Ion: 1.2 mmol/L (ref 1.12–1.23)
Chloride: 102 mmol/L (ref 101–111)
Creatinine, Ser: 1.2 mg/dL (ref 0.61–1.24)
Glucose, Bld: 170 mg/dL — ABNORMAL HIGH (ref 65–99)
HCT: 42 % (ref 39.0–52.0)
Hemoglobin: 14.3 g/dL (ref 13.0–17.0)
Potassium: 3.6 mmol/L (ref 3.5–5.1)
Sodium: 140 mmol/L (ref 135–145)
TCO2: 25 mmol/L (ref 0–100)

## 2015-09-24 LAB — URINE MICROSCOPIC-ADD ON: WBC, UA: NONE SEEN WBC/hpf (ref 0–5)

## 2015-09-24 LAB — DIFFERENTIAL
Basophils Absolute: 0 10*3/uL (ref 0.0–0.1)
Basophils Relative: 0 %
Eosinophils Absolute: 0.2 10*3/uL (ref 0.0–0.7)
Eosinophils Relative: 4 %
Lymphocytes Relative: 29 %
Lymphs Abs: 1.8 10*3/uL (ref 0.7–4.0)
Monocytes Absolute: 0.5 10*3/uL (ref 0.1–1.0)
Monocytes Relative: 8 %
Neutro Abs: 3.7 10*3/uL (ref 1.7–7.7)
Neutrophils Relative %: 59 %

## 2015-09-24 LAB — CBC
HCT: 40.6 % (ref 39.0–52.0)
Hemoglobin: 14.6 g/dL (ref 13.0–17.0)
MCH: 31.3 pg (ref 26.0–34.0)
MCHC: 36 g/dL (ref 30.0–36.0)
MCV: 86.9 fL (ref 78.0–100.0)
Platelets: 249 10*3/uL (ref 150–400)
RBC: 4.67 MIL/uL (ref 4.22–5.81)
RDW: 14 % (ref 11.5–15.5)
WBC: 6.1 10*3/uL (ref 4.0–10.5)

## 2015-09-24 LAB — CBG MONITORING, ED: Glucose-Capillary: 202 mg/dL — ABNORMAL HIGH (ref 65–99)

## 2015-09-24 MED ORDER — LORAZEPAM 2 MG/ML IJ SOLN
1.0000 mg | Freq: Once | INTRAMUSCULAR | Status: AC
  Administered 2015-09-24: 1 mg via INTRAVENOUS
  Filled 2015-09-24: qty 1

## 2015-09-24 MED ORDER — LORAZEPAM 2 MG/ML IJ SOLN
1.0000 mg | Freq: Once | INTRAMUSCULAR | Status: AC
Start: 2015-09-24 — End: 2015-09-24
  Administered 2015-09-24: 1 mg via INTRAVENOUS
  Filled 2015-09-24: qty 1

## 2015-09-24 NOTE — ED Notes (Signed)
MD at bedside. 

## 2015-09-24 NOTE — Consult Note (Signed)
NEURO HOSPITALIST CONSULT NOTE      Reason for Consult: double vision    History obtained from:  Patient    HPI:                                                                                                                                          TAJAE MICHAL is an 75 yo male with hx of poorly controlled DM and HTN presents with binocular diplopia upon waking up this am. 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  Past Medical History  Diagnosis Date  . Hypertension   . Diabetes mellitus type 2 in obese ([place])   . PONV (postoperative nausea and vomiting)   . GERD (gastroesophageal reflux disease)     occ indigestion  . Arthritis     arthirtis all over  . Right knee DJD   . Hyperlipemia   . Frequent urination at night   . Primary osteoarthritis of right knee   . Primary localized osteoarthritis of right hip   . Spinal stenosis of lumbar region with radiculopathy     s/p L4/L5 transforaminal blocks  . PONV (postoperative nausea and vomiting)   . Glaucoma     both eyes  . Restless leg syndrome   . Concussion     multiple- college football player  . Kidney stones     Past Surgical History  Procedure Laterality Date  . Lithotripsy    . Shoulder surgery      right  . Elbow surgery      right  . Abdominal hernia repair    . Total knee arthroplasty Left 07/2006    left knee  . Hernia repair  1980    inguinal  . Shoulder arthroscopy  1980    right  . Elbow arthroscopy  1990    right  . Stone extraction with basket  2010  . Joint replacement  2008    knee  . Lumbar disc surgery      2015  . Total knee arthroplasty Right 01/08/2014    Procedure: RIGHT TOTAL KNEE ARTHROPLASTY;  Surgeon: Lorn Junes, MD;  Location: [facility];  Service: Orthopedics;  Laterality: Right;  . Back surgery    . Total hip arthroplasty Right 11/06/2014    Procedure: RIGHT TOTAL HIP ARTHROPLASTY ANTERIOR APPROACH;  Surgeon: Renette Butters, MD;  Location:  [facility];  Service: Orthopedics;  Laterality: Right;  . Hip closed reduction Right 11/09/2014    Procedure: CLOSED REDUCTION HIP, s/p total hip 8/9;  Surgeon: Ninetta Lights, MD;  Location: [facility];  Service: Orthopedics;  Laterality: Right;  . Colonoscopy    . Revision total hip arthroplasty Right 01/08/2015  . Anterior hip revision Right 01/08/2015  Procedure: RIGHT ANTERIOR HIP REVISION;  Surgeon: Renette Butters, MD;  Location: [facility];  Service: Orthopedics;  Laterality: Right;    Family History  Problem Relation Age of Onset  . Hypertension Mother   . Kidney disease Mother   . Hypertension Father   . Diabetes Mellitus II Father   . Diabetes Mellitus II Brother   . Diabetes Mellitus II Brother   . Cancer - Prostate Brother   . Colon cancer Neg Hx       Social History:  reports that he has never smoked. He has never used smokeless tobacco. He reports that he drinks alcohol. He reports that he does not use illicit drugs.  Allergies  Allergen Reactions  . Morphine And Related     Causes nausea & vomiting    MEDICATIONS:                                                                                                                     I have reviewed the patient's current medications.   ROS:                                                                                                                                       History obtained from the patient  General ROS: negative for - chills, fatigue, fever, night sweats, weight gain or weight loss Psychological ROS: negative for - behavioral disorder, hallucinations, memory difficulties, mood swings or suicidal ideation Ophthalmic ROS: negative for - blurry vision, double vision, eye pain or loss of vision ENT ROS: negative for - epistaxis, nasal discharge, oral lesions, sore throat, tinnitus or vertigo Allergy and Immunology ROS: negative for - hives or itchy/watery eyes Hematological and Lymphatic ROS: negative for -  bleeding problems, bruising or swollen lymph nodes Endocrine ROS: negative for - galactorrhea, hair pattern changes, polydipsia/polyuria or temperature intolerance Respiratory ROS: negative for - cough, hemoptysis, shortness of breath or wheezing Cardiovascular ROS: negative for - chest pain, dyspnea on exertion, edema or irregular heartbeat Gastrointestinal ROS: negative for - abdominal pain, diarrhea, hematemesis, nausea/vomiting or stool incontinence Genito-Urinary ROS: negative for - dysuria, hematuria, incontinence or urinary frequency/urgency Musculoskeletal ROS: negative for - joint swelling or muscular weakness Neurological ROS: as noted in HPI Dermatological ROS: negative for rash and skin lesion changes   Blood pressure 142/85, pulse 95, temperature 97.6 F (36.4 C), temperature source Oral, resp. rate 13, SpO2 94 %.  Neurologic Examination:                                                                                                      HEENT-  Normocephalic, no lesions, without obvious abnormality.  Normal external eye and conjunctiva.  Normal TM's bilaterally.  Normal auditory canals and external ears. Normal external nose, mucus membranes and septum.  Normal pharynx. Cardiovascular- regular rate and rhythm, S1, S2 normal, no murmur, click, rub or gallop, pulses palpable throughout   Lungs- chest clear, no wheezing, rales, normal symmetric air entry, Heart exam - S1, S2 normal, no murmur, no gallop, rate regular Abdomen- soft, non-tender; bowel sounds normal; no masses,  no organomegaly   Neurological Examination Mental Status: Alert, oriented, thought content appropriate.  Speech fluent without evidence of aphasia.  Able to follow 3 step commands without difficulty. Cranial Nerves: II: Visual fields grossly normal, pupils equal, round, reactive to light and accommodation III,IV, VI: ptosis not present, Partial L 6th nerve palsy V,VII: smile symmetric, facial light touch  sensation normal bilaterally VIII: hearing normal bilaterally IX,X: uvula rises symmetrically XI: bilateral shoulder shrug XII: midline tongue extension Motor: Right : Upper extremity   5/5    Left:     Upper extremity   5/5  Lower extremity   5/5     Lower extremity   5/5 Tone and bulk:normal tone throughout; no atrophy noted Sensory: Pinprick and light touch intact throughout, bilaterally Cerebellar: normal finger-to-nose,       Lab Results: Basic Metabolic Panel:  Recent Labs Lab 09/23/15 1835 09/23/15 1900 09/24/15 0250  NA 136 139 140  K 4.2 4.1 3.6  CL 104 104 102  CO2 19*  --   --   GLUCOSE 233* 243* 170*  BUN 26* 36* 26*  CREATININE 1.42* 1.30* 1.20  CALCIUM 9.9  --   --     Liver Function Tests:  Recent Labs Lab 09/23/15 1835  AST 47*  ALT 37  ALKPHOS 46  BILITOT 1.4*  PROT 7.5  ALBUMIN 3.9   No results for input(s): LIPASE, AMYLASE in the last 168 hours. No results for input(s): AMMONIA in the last 168 hours.  CBC:  Recent Labs Lab 09/23/15 1835 09/23/15 1900 09/24/15 0218 09/24/15 0250  WBC 6.8  --  6.1  --   NEUTROABS 4.4  --  3.7  --   HGB 16.1 15.6 14.6 14.3  HCT 44.3 46.0 40.6 42.0  MCV 88.1  --  86.9  --   PLT 299  --  249  --     Cardiac Enzymes: No results for input(s): CKTOTAL, CKMB, CKMBINDEX, TROPONINI in the last 168 hours.  Lipid Panel: No results for input(s): CHOL, TRIG, HDL, CHOLHDL, VLDL, LDLCALC in the last 168 hours.  CBG:  Recent Labs Lab 09/24/15 0013  GLUCAP 57*    Microbiology: Results for orders placed or performed during the hospital encounter of 01/08/15  Anaerobic culture     Status: None   Collection Time: 01/08/15  1:37 PM  Result Value Ref Range Status   Specimen  Description WOUND HIP RIGHT  Final   Special Requests SPEC A  Final   Gram Stain   Final    NO WBC SEEN NO SQUAMOUS EPITHELIAL CELLS SEEN NO ORGANISMS SEEN Performed at [facility]    Culture   Final    NO ANAEROBES  ISOLATED Performed at [facility]    Report Status 01/13/2015 FINAL  Final  Wound culture     Status: None   Collection Time: 01/08/15  1:37 PM  Result Value Ref Range Status   Specimen Description WOUND HIP RIGHT  Final   Special Requests SPEC A  Final   Gram Stain   Final    NO WBC SEEN NO SQUAMOUS EPITHELIAL CELLS SEEN NO ORGANISMS SEEN Performed at [facility]    Culture   Final    NO GROWTH 2 DAYS Performed at [facility]    Report Status 01/11/2015 FINAL  Final  Tissue culture     Status: None   Collection Time: 01/08/15  1:40 PM  Result Value Ref Range Status   Specimen Description TISSUE HIP RIGHT  Final   Special Requests SPEC B  Final   Gram Stain   Final    RARE WBC PRESENT,BOTH PMN AND MONONUCLEAR RARE SQUAMOUS EPITHELIAL CELLS PRESENT NO ORGANISMS SEEN Gram Stain Report Called to,Read Back By and Verified With: Gram Stain Report Called to,Read Back By and Verified With: ENE ALI 01/09/2015 9:00AM BY COOKV Performed at [facility]    Culture   Final    NO GROWTH 3 DAYS Performed at [facility]    Report Status 01/12/2015 FINAL  Final    Coagulation Studies:  Recent Labs  09/23/15 1835  LABPROT 14.2  INR 1.08    Imaging: Ct Head Wo Contrast  09/23/2015  CLINICAL DATA:  Double vision. EXAM: CT HEAD WITHOUT CONTRAST TECHNIQUE: Contiguous axial images were obtained from the base of the skull through the vertex without intravenous contrast. COMPARISON:  None. FINDINGS: No subdural, epidural, or subarachnoid hemorrhage. Ventricles and sulci are unremarkable. Cerebellum, brainstem, and basal cisterns are within normal limits. No mass, mass effect, or midline shift. Mild white matter changes. No acute cortical ischemia or infarct. IMPRESSION: No acute abnormalities. Electronically Signed   By: Dorise Bullion III M.D   On: 09/23/2015 20:09   Mr Angiogram Head Wo Contrast  09/24/2015  CLINICAL DATA:  Double vision.  Onset upon awakening. LEFT lateral gaze policy. Query proptosis. EXAM: MRI HEAD WITHOUT CONTRAST; MRA HEAD WITHOUT CONTRAST; MRI ORBITS WITHOUT CONTRAST TECHNIQUE: Multiplanar, multisequence MR imaging was performed. No intravenous contrast was administered. COMPARISON:  CT head 09/23/2015. FINDINGS: The patient was unable to remain motionless for the exam. Small or subtle lesions could be overlooked. MR BRAIN: No evidence for acute infarction, hemorrhage, mass lesion, or extra-axial fluid. Global atrophy with hydrocephalus ex vacuo. Prominent perivascular spaces suggest longstanding hypertension. Moderately advanced chronic microvascular ischemic change. Scattered areas of chronic lacunar infarction. No midline abnormality. Extracranial soft tissues unremarkable. MRA INTRACRANIAL: The internal carotid arteries are dolichoectatic but widely patent. The basilar artery is dolichoectatic but widely patent. Artifactual signal loss in the middle cerebral arteries and posterior cerebral arteries due to saturation of in plane flow. No proximal large vessel occlusion. No visible intracranial aneurysm. MR ORBITS: Globes are symmetric. No orbital mass or inflammation. Prominent perioptic CSF suggesting optic atrophy. Orbital musculature appears symmetric. No acute paranasal sinus disease or visible blowout injury. IMPRESSION: Motion degraded MR brain. No  acute stroke. Atrophy and small vessel disease. No large vessel occlusion.  No intracranial aneurysm. Marginally diagnostic orbital MR. No orbital mass or inflammation. 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 Electronically Signed   By: Staci Righter M.D.   On: 09/24/2015 08:29   Mr Brain Wo Contrast  09/24/2015  CLINICAL DATA:  Double vision. Onset upon awakening. LEFT lateral gaze policy. Query proptosis. EXAM: MRI HEAD WITHOUT CONTRAST; MRA HEAD WITHOUT CONTRAST; MRI ORBITS WITHOUT CONTRAST TECHNIQUE: Multiplanar, multisequence MR imaging was performed. No intravenous contrast was  administered. COMPARISON:  CT head 09/23/2015. FINDINGS: The patient was unable to remain motionless for the exam. Small or subtle lesions could be overlooked. MR BRAIN: No evidence for acute infarction, hemorrhage, mass lesion, or extra-axial fluid. Global atrophy with hydrocephalus ex vacuo. Prominent perivascular spaces suggest longstanding hypertension. Moderately advanced chronic microvascular ischemic change. Scattered areas of chronic lacunar infarction. No midline abnormality. Extracranial soft tissues unremarkable. MRA INTRACRANIAL: The internal carotid arteries are dolichoectatic but widely patent. The basilar artery is dolichoectatic but widely patent. Artifactual signal loss in the middle cerebral arteries and posterior cerebral arteries due to saturation of in plane flow. No proximal large vessel occlusion. No visible intracranial aneurysm. MR ORBITS: Globes are symmetric. No orbital mass or inflammation. Prominent perioptic CSF suggesting optic atrophy. Orbital musculature appears symmetric. No acute paranasal sinus disease or visible blowout injury. IMPRESSION: Motion degraded MR brain. No acute stroke. Atrophy and small vessel disease. No large vessel occlusion.  No intracranial aneurysm. Marginally diagnostic orbital MR. No orbital mass or inflammation. 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 Electronically Signed   By: Staci Righter M.D.   On: 09/24/2015 08:29   Mr Farley Ly Cm  09/24/2015  CLINICAL DATA:  Double vision. Onset upon awakening. LEFT lateral gaze policy. Query proptosis. EXAM: MRI HEAD WITHOUT CONTRAST; MRA HEAD WITHOUT CONTRAST; MRI ORBITS WITHOUT CONTRAST TECHNIQUE: Multiplanar, multisequence MR imaging was performed. No intravenous contrast was administered. COMPARISON:  CT head 09/23/2015. FINDINGS: The patient was unable to remain motionless for the exam. Small or subtle lesions could be overlooked. MR BRAIN: No evidence for acute infarction, hemorrhage, mass lesion, or extra-axial fluid.  Global atrophy with hydrocephalus ex vacuo. Prominent perivascular spaces suggest longstanding hypertension. Moderately advanced chronic microvascular ischemic change. Scattered areas of chronic lacunar infarction. No midline abnormality. Extracranial soft tissues unremarkable. MRA INTRACRANIAL: The internal carotid arteries are dolichoectatic but widely patent. The basilar artery is dolichoectatic but widely patent. Artifactual signal loss in the middle cerebral arteries and posterior cerebral arteries due to saturation of in plane flow. No proximal large vessel occlusion. No visible intracranial aneurysm. MR ORBITS: Globes are symmetric. No orbital mass or inflammation. Prominent perioptic CSF suggesting optic atrophy. Orbital musculature appears symmetric. No acute paranasal sinus disease or visible blowout injury. IMPRESSION: Motion degraded MR brain. No acute stroke. Atrophy and small vessel disease. No large vessel occlusion.  No intracranial aneurysm. Marginally diagnostic orbital MR. No orbital mass or inflammation. 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 Electronically Signed   By: Staci Righter M.D.   On: 09/24/2015 08:29         Assessment/Plan:  75 yo male with hx of poorly controlled DM and HTN presents with binocular diplopia upon waking up this am. 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  MRI brain/MRA head and MRI oribit are all negative  Based on exam this appears to be a peripheral process most likely associated with his DM and HTN  Ideally if it is related to the above  it should improve in 6-8 weeks and should follow with a Neuro oplth as an outpatient. If starts to decline or have other symptoms will need to come back to ER for further eval.

## 2015-09-24 NOTE — Telephone Encounter (Signed)
I'll await the inpatient notes.  Thanks.

## 2015-09-24 NOTE — ED Notes (Signed)
Neuro at bedside.

## 2015-09-24 NOTE — Discharge Instructions (Signed)
Diplopia Get an eye patch at your local pharmacy and placed over your left eye to prevent double vision. Call Dr.Groat's office today to schedule an appoint for within the next 10 days so that he may recheck you. Diplopia is the condition of having double vision or seeing two of a single object. There are many causes of diplopia. Some are not dangerous and can be easily corrected. Diplopia may also be a symptom of a serious medical problem. There are two types of diplopia.  Monocular diplopia. This is double vision that affects only one eye. Monocular diplopia is often caused by a clouding of the lens in your eye (cataract) or by disruptions in the way that your eye focuses light.  Binocular diplopia. This is double vision that affects both eyes. However, when you shut one eye, the double vision will go away. Binocular diplopia may be more serious. It can be caused by:  Problems with the nerves or muscles that are responsible for eye movement.  Neurologic diseases.  Thyroid problems.  Tumors.  An infection near your eyes.  A stroke. You may need to see a health care provider who specializes in eye conditions (ophthalmologist) or a nerve specialist (neurologist) to find the cause. HOME CARE INSTRUCTIONS  Tell your health care provider about any changes in your vision.  Do not drive or operate heavy machinery if diplopia interferes with your vision.  Keep all follow-up visits as directed by your health care provider. This is important. SEEK MEDICAL CARE IF:  Your diplopia gets worse.  You develop any other symptoms along with your diplopia, such as:  Weakness.  Numbness.  Headache.  Eye pain.  Clumsiness.  Nausea.  Drooping eyelids.  Abnormal movement of one of your eyes. SEEK IMMEDIATE MEDICAL CARE IF:  You have sudden vision loss.  You suddenly get a very bad headache.  You have sudden weakness or numbness.  You suddenly lose the ability to speak, understand  speech, or both.   This information is not intended to replace advice given to you by your health care provider. Make sure you discuss any questions you have with your health care provider.   Document Released: 01/16/2004 Document Revised: 07/31/2014 Document Reviewed: 02/07/2014 Elsevier Interactive Patient Education [employer].

## 2015-09-24 NOTE — Telephone Encounter (Signed)
Per chart review tab pt was seen [facility] on 09/23/15 and admitted to hospital.

## 2015-09-24 NOTE — Telephone Encounter (Signed)
PLEASE NOTE: All timestamps contained within this report are represented as [place] Standard Time. CONFIDENTIALTY NOTICE: This fax transmission is intended only for the addressee. It contains information that is legally privileged, confidential or otherwise protected from use or disclosure. If you are not the intended recipient, you are strictly prohibited from reviewing, disclosing, copying using or disseminating any of this information or taking any action in reliance on or regarding this information. If you have received this fax in error, please notify us immediately by telephone so that we can arrange for its return to [place]. Phone: [phone], Toll-Free: [phone], Fax: [phone] Page: 1 of 2 Call Id: [id number] [place] Patient Name: [name] Gender: Unknown DOB: Nov 27, 1940 Age: 75 Y 65 M 23 D Return Phone Number: [phone] (Primary), [phone] (Secondary) Address: City/State/Zip: [place] Client [place] [facility] - Client Client Site [place] Physician Renford Dills - MD Contact Type Call Who Is Calling Patient / Member / Family / Caregiver Call Type Triage / Clinical Relationship To Patient Self Return Phone Number [phone] (Primary) Chief Complaint VISION - sudden loss or sudden decrease (NOT blurred vision) Reason for Call Symptomatic / Request for [place] states he woke up this morning with double vision and he is still having double vision. He also has a BS reading of 300s and BP of 150/94. PreDisposition Call Doctor Translation No Nurse Assessment Nurse: Wynetta Emery, RN, Baker Janus Date/Time Eilene Ghazi Time): 09/23/2015 5:02:17 PM Confirm and document reason for call. If symptomatic, describe symptoms. You must click the next button to save text entered. ---Konrad Dolores has double vision this am  and blood pressure a little elevated and blood sugar is 330 onset one month ago Has the patient traveled out of the country within the last 30 days? ---No Does the patient have any new or worsening symptoms? ---Yes Will a triage be completed? ---Yes Related visit to physician within the last 2 weeks? ---No Does the PT have any chronic conditions? (i.e. diabetes, asthma, etc.) ---Yes List chronic conditions. ---Diabetes HTN Is this a behavioral health or substance abuse call? ---No Guidelines Guideline Title Affirmed Question Affirmed Notes Nurse Date/Time (Eastern Time) Diabetes - High Blood Sugar [1] Symptoms of high blood sugar (e.g., frequent urination, weak, weight loss) AND [2] not able to test blood glucose Wynetta Emery, RN, Baker Janus 09/23/2015 5:04:17 PM Disp. Time Eilene Ghazi Time) Disposition Final User 09/23/2015 5:00:59 PM Send to Urgent Queue Honor Loh PLEASE NOTE: All timestamps contained within this report are represented as [place] Standard Time. CONFIDENTIALTY NOTICE: This fax transmission is intended only for the addressee. It contains information that is legally privileged, confidential or otherwise protected from use or disclosure. If you are not the intended recipient, you are strictly prohibited from reviewing, disclosing, copying using or disseminating any of this information or taking any action in reliance on or regarding this information. If you have received this fax in error, please notify us immediately by telephone so that we can arrange for its return to [place]. Phone: [phone], Toll-Free: [phone], Fax: [phone] Page: 2 of 2 Call Id: [id number] 09/23/2015 5:08:47 PM Go to ED Now (or PCP triage) Yes Wynetta Emery, RN, Baker Janus Disposition Overriden: See Physician within 24 Hours Override Reason: Patient's symptoms have resolved during call Caller Understands: Yes Disagree/Comply: Comply Care Advice Given Per Guideline SEE PHYSICIAN WITHIN 24 HOURS: * Drink at  least one glass (8 oz;  240 ml) of water per hour for the next 4 hours (Reason: adequate hydration will reduce hyperglycemia). * Generally, you should try to drink 6-8 glasses of water each day. * Vomiting occurs * Rapid breathing occurs GO TO ED NOW (OR PCP TRIAGE): DRIVING: Another adult should drive. * It is also a good idea to bring the pill bottles too. This will help the doctor to make certain you are taking the right medicines and the right dose. CARE ADVICE given per Diabetes - High Blood Sugar (Adult) guideline. Comments User: Michele Rockers, RN Date/Time Eilene Ghazi Time): 09/23/2015 5:11:53 PM NOTE had boughten gummy bear vitamins a month ago and through his blood sugars out of wack today his blood pressure is elevated with blood sugar readings in mid 300's all day and he is having blurred vision frequency of urine --drinking extra water has not helped his blood sugars at this time. Nurse advised with new symptoms of blood sugar in 300's and blurred vision and frequency of urine not improved with medication or water he needs to be seen in ED Advised wife to drive him to ED. Referrals [facility] - ED

## 2015-09-24 NOTE — ED Notes (Signed)
CBG is 202.

## 2015-09-24 NOTE — ED Provider Notes (Signed)
CSN: [id number]     Arrival date & time 09/23/15  1748 History  By signing my name below, I, Altamease Oiler, attest that this documentation has been prepared under the direction and in the presence of Jola Schmidt, MD. Electronically Signed: Altamease Oiler, ED Scribe. 09/24/2015. 1:53 AM    Chief Complaint  Patient presents with  . Blurred Vision  . Hypertension  . Hyperglycemia   The history is provided by the patient. No language interpreter was used.   LEMOINE TIVNAN is a 75 y.o. male with PMHx of HTN, HLD, and DM who presents to the Emergency Department complaining of bilateral double vision with onset upon waking today. The sensation is not present in either eye when the contralateral eye is covered. He most notes the sensation when looking straight ahead and it is improved with looking right or left. He notes occasional mild pain at the right lower chest that feels like what he has had with gas in the past.  Pt denies headache, abnormal taste in the mouth, weakness, speech difficulty, facial droop, and abdominal pain. The pt does not some. He denies any history of MI or stroke. Pt takes a daily aspirin and denies other blood thinners.   Past Medical History  Diagnosis Date  . Hypertension   . Diabetes mellitus type 2 in obese ([place])   . PONV (postoperative nausea and vomiting)   . GERD (gastroesophageal reflux disease)     occ indigestion  . Arthritis     arthirtis all over  . Right knee DJD   . Hyperlipemia   . Frequent urination at night   . Primary osteoarthritis of right knee   . Primary localized osteoarthritis of right hip   . Spinal stenosis of lumbar region with radiculopathy     s/p L4/L5 transforaminal blocks  . PONV (postoperative nausea and vomiting)   . Glaucoma     both eyes  . Restless leg syndrome   . Concussion     multiple- college football player  . Kidney stones    Past Surgical History  Procedure Laterality Date  . Lithotripsy    . Shoulder  surgery      right  . Elbow surgery      right  . Abdominal hernia repair    . Total knee arthroplasty Left 07/2006    left knee  . Hernia repair  1980    inguinal  . Shoulder arthroscopy  1980    right  . Elbow arthroscopy  1990    right  . Stone extraction with basket  2010  . Joint replacement  2008    knee  . Lumbar disc surgery      2015  . Total knee arthroplasty Right 01/08/2014    Procedure: RIGHT TOTAL KNEE ARTHROPLASTY;  Surgeon: Lorn Junes, MD;  Location: [place];  Service: Orthopedics;  Laterality: Right;  . Back surgery    . Total hip arthroplasty Right 11/06/2014    Procedure: RIGHT TOTAL HIP ARTHROPLASTY ANTERIOR APPROACH;  Surgeon: Renette Butters, MD;  Location: [place];  Service: Orthopedics;  Laterality: Right;  . Hip closed reduction Right 11/09/2014    Procedure: CLOSED REDUCTION HIP, s/p total hip 8/9;  Surgeon: Ninetta Lights, MD;  Location: [place];  Service: Orthopedics;  Laterality: Right;  . Colonoscopy    . Revision total hip arthroplasty Right 01/08/2015  . Anterior hip revision Right 01/08/2015    Procedure: RIGHT ANTERIOR HIP REVISION;  Surgeon: Christia Reading  Maryla Morrow, MD;  Location: [place];  Service: Orthopedics;  Laterality: Right;   Family History  Problem Relation Age of Onset  . Hypertension Mother   . Kidney disease Mother   . Hypertension Father   . Diabetes Mellitus II Father   . Diabetes Mellitus II Brother   . Diabetes Mellitus II Brother   . Cancer - Prostate Brother   . Colon cancer Neg Hx    Social History  Substance Use Topics  . Smoking status: Never Smoker   . Smokeless tobacco: Never Used  . Alcohol Use: Yes     Comment: glass of wine every 3-4 months    Review of Systems 10 Systems reviewed and all are negative for acute change except as noted in the HPI.   Allergies  Morphine and related  Home Medications   Prior to Admission medications   Medication Sig Start Date End Date Taking? Authorizing Provider  allopurinol  (ZYLOPRIM) 300 MG tablet Take 0.5 tablets (150 mg total) by mouth every morning. Take half a tablet (150mg ) by mouth daily 08/06/15   Tonia Ghent, MD  amLODipine (NORVASC) 10 MG tablet Take 1 tablet (10 mg total) by mouth daily. 05/09/15   Tonia Ghent, MD  aspirin 81 MG tablet Take 81 mg by mouth daily.    Historical Provider, MD  atorvastatin (LIPITOR) 10 MG tablet Take 1 tablet (10 mg total) by mouth at bedtime. 03/11/15   Tonia Ghent, MD  Cholecalciferol (VITAMIN D-3) 5000 UNITS TABS Take 5,000 Units by mouth daily.    Historical Provider, MD  gemfibrozil (LOPID) 600 MG tablet Take 600 mg by mouth 2 (two) times daily.     Historical Provider, MD  glipiZIDE (GLUCOTROL) 10 MG tablet Take 1 tablet (10 mg total) by mouth daily before breakfast. 02/26/15   Tonia Ghent, MD  insulin glargine (LANTUS) 100 UNIT/ML injection Inject 0.55 mLs (55 Units total) into the skin 2 (two) times daily. Disp 5 pens at once 06/11/15   Tonia Ghent, MD  latanoprost (XALATAN) 0.005 % ophthalmic solution Place 1 drop into both eyes at bedtime.    Historical Provider, MD  losartan (COZAAR) 100 MG tablet Take 100 mg by mouth daily.    Historical Provider, MD  metFORMIN (GLUCOPHAGE) 500 MG tablet Take 1 tablet (500 mg total) by mouth 2 (two) times daily with a meal. 03/11/15   Tonia Ghent, MD  Multiple Vitamins-Minerals (VITRUM 50+ SENIOR MULTI PO) Take 1 tablet by mouth daily.     Historical Provider, MD  Omega-3 Fatty Acids (FISH OIL) 1000 MG CAPS Take 1 capsule by mouth daily.    Historical Provider, MD  polyethylene glycol (MIRALAX / GLYCOLAX) packet Take 17 g by mouth 2 (two) times daily. Patient taking differently: Take 17 g by mouth daily as needed for mild constipation.  01/10/14   Kirstin Shepperson, PA-C  sildenafil (REVATIO) 20 MG tablet Take 3-5 tablets (60-100 mg total) by mouth daily as needed. 06/11/15   Tonia Ghent, MD  vitamin C (ASCORBIC ACID) 500 MG tablet Take 500 mg by mouth daily.     Historical Provider, MD   BP 157/95 mmHg  Pulse 80  Temp(Src) 97.6 F (36.4 C) (Oral)  Resp 15  SpO2 95% Physical Exam  Constitutional: He is oriented to person, place, and time. He appears well-developed and well-nourished.  HENT:  Head: Normocephalic and atraumatic.  Moist mucous membranes No exudate  Eyes: Conjunctivae are normal. Pupils are  equal, round, and reactive to light.  Diplopia.  Mild proptosis of the left eye as compared to the right  Neck: Normal range of motion.  Trachea midline  Cardiovascular: Normal rate and regular rhythm.   Pulmonary/Chest: Effort normal and breath sounds normal. No stridor.  Abdominal: Soft. He exhibits no mass. There is no tenderness. There is no rebound and no guarding.  Musculoskeletal: Normal range of motion.  Neurological: He is alert and oriented to person, place, and time.  5/5 strength in major muscle groups of  bilateral upper and lower extremities. Speech normal. No facial asymetry.  Left eye lateral gaze palsy.  Skin: Skin is warm and dry.  Psychiatric: He has a normal mood and affect. His behavior is normal.  Nursing note and vitals reviewed.   ED Course  Procedures (including critical care time) DIAGNOSTIC STUDIES: Oxygen Saturation is 95% on RA,  normal by my interpretation.    COORDINATION OF CARE: 1:44 AM Discussed treatment plan which includes lab work, EKG, CT head, and MRI  with pt at bedside and pt agreed to plan. Labs Review Labs Reviewed  APTT - Abnormal; Notable for the following:    aPTT 45 (*)    All other components within normal limits  CBC - Abnormal; Notable for the following:    MCHC 36.3 (*)    All other components within normal limits  COMPREHENSIVE METABOLIC PANEL - Abnormal; Notable for the following:    CO2 19 (*)    Glucose, Bld 233 (*)    BUN 26 (*)    Creatinine, Ser 1.42 (*)    AST 47 (*)    Total Bilirubin 1.4 (*)    GFR calc non Af Amer 47 (*)    GFR calc Af Amer 55 (*)    All other  components within normal limits  URINALYSIS, ROUTINE W REFLEX MICROSCOPIC (NOT AT [facility]) - Abnormal; Notable for the following:    Hgb urine dipstick TRACE (*)    Protein, ur 30 (*)    All other components within normal limits  URINE MICROSCOPIC-ADD ON - Abnormal; Notable for the following:    Squamous Epithelial / LPF 0-5 (*)    Bacteria, UA RARE (*)    Casts HYALINE CASTS (*)    All other components within normal limits  CBG MONITORING, ED - Abnormal; Notable for the following:    Glucose-Capillary 202 (*)    All other components within normal limits  I-STAT CHEM 8, ED - Abnormal; Notable for the following:    BUN 36 (*)    Creatinine, Ser 1.30 (*)    Glucose, Bld 243 (*)    All other components within normal limits  I-STAT CHEM 8, ED - Abnormal; Notable for the following:    BUN 26 (*)    Glucose, Bld 170 (*)    All other components within normal limits  PROTIME-INR  DIFFERENTIAL  CBC  DIFFERENTIAL  URINE RAPID DRUG SCREEN, HOSP PERFORMED  ETHANOL  I-STAT TROPOININ, ED  I-STAT TROPOININ, ED    Imaging Review Ct Head Wo Contrast  09/23/2015  CLINICAL DATA:  Double vision. EXAM: CT HEAD WITHOUT CONTRAST TECHNIQUE: Contiguous axial images were obtained from the base of the skull through the vertex without intravenous contrast. COMPARISON:  None. FINDINGS: No subdural, epidural, or subarachnoid hemorrhage. Ventricles and sulci are unremarkable. Cerebellum, brainstem, and basal cisterns are within normal limits. No mass, mass effect, or midline shift. Mild white matter changes. No acute cortical ischemia or infarct.  IMPRESSION: No acute abnormalities. Electronically Signed   By: Dorise Bullion III M.D   On: 09/23/2015 20:09   I have personally reviewed and evaluated these images and lab results as part of my medical decision-making.   EKG Interpretation   Date/Time:  Monday September 23 2015 18:31:49 EDT Ventricular Rate:  105 PR Interval:  172 QRS Duration: 88 QT Interval:   342 QTC Calculation: 452 R Axis:   -8 Text Interpretation:  Sinus tachycardia Otherwise normal ECG No  significant change was found Confirmed by Magic Mohler  MD, Lennette Bihari ([id number]) on  09/24/2015 1:43:46 AM      MDM   Final diagnoses:  Double vision    Patient will undergo MR brain as well as MR orbits for new diplopia and left lateral gaze palsy.  There also appears to be a small amount of proptosis of the left eye and thus the MR orbits.  I suspect this is a stroke and I have informed the family.  Stroke workup complete.  No prior history of stroke.  Each unilateral eye vision is normal when tested independently.  Care to Dr Winfred Leeds to follow-up on MR imaging and ultimate disposition  I personally performed the services described in this documentation, which was scribed in my presence. The recorded information has been reviewed and is accurate.       Jola Schmidt, MD 09/24/15 [id number]

## 2015-09-24 NOTE — ED Provider Notes (Signed)
Patient awakened 7 AM yesterday with double vision. Double vision is particular pronounced when he lifts the left. He denies pain anywhere. No focal numbness or weakness no difficulty speaking. No headache. On exam patient is alert Glasgow Coma Score 15 eyes pupils equal round react to light. He is unable to abduct left eye when looking to the left.  Consistent with left sixth nerve palsy. No proptosis. He has no diplopia on looking to the right, up or down. Other cranial nerves II through XII grossly intact Moves all extremities well. Neurology consulted to evaluate patient in ED Neurology feels that patient be discharged to home with ophthalmology follow up. He feels that patient has a peripheral sixth nerve palsy. May need follow-up with neuro-ophthalmology. I discussed case with Dr. Katy Fitch,, patient's ophthalmologist who is comfortable following patient himself. He suggests I patch and follow-up at office within the next 10 days. Results for orders placed or performed during the hospital encounter of 09/24/15  Protime-INR  Result Value Ref Range   Prothrombin Time 14.2 11.6 - 15.2 seconds   INR 1.08 0.00 - 1.49  APTT  Result Value Ref Range   aPTT 45 (H) 24 - 37 seconds  CBC  Result Value Ref Range   WBC 6.8 4.0 - 10.5 K/uL   RBC 5.03 4.22 - 5.81 MIL/uL   Hemoglobin 16.1 13.0 - 17.0 g/dL   HCT 44.3 39.0 - 52.0 %   MCV 88.1 78.0 - 100.0 fL   MCH 32.0 26.0 - 34.0 pg   MCHC 36.3 (H) 30.0 - 36.0 g/dL   RDW 14.1 11.5 - 15.5 %   Platelets 299 150 - 400 K/uL  Differential  Result Value Ref Range   Neutrophils Relative % 65 %   Neutro Abs 4.4 1.7 - 7.7 K/uL   Lymphocytes Relative 22 %   Lymphs Abs 1.5 0.7 - 4.0 K/uL   Monocytes Relative 8 %   Monocytes Absolute 0.6 0.1 - 1.0 K/uL   Eosinophils Relative 4 %   Eosinophils Absolute 0.2 0.0 - 0.7 K/uL   Basophils Relative 1 %   Basophils Absolute 0.0 0.0 - 0.1 K/uL  Comprehensive metabolic panel  Result Value Ref Range   Sodium 136 135 -  145 mmol/L   Potassium 4.2 3.5 - 5.1 mmol/L   Chloride 104 101 - 111 mmol/L   CO2 19 (L) 22 - 32 mmol/L   Glucose, Bld 233 (H) 65 - 99 mg/dL   BUN 26 (H) 6 - 20 mg/dL   Creatinine, Ser 1.42 (H) 0.61 - 1.24 mg/dL   Calcium 9.9 8.9 - 10.3 mg/dL   Total Protein 7.5 6.5 - 8.1 g/dL   Albumin 3.9 3.5 - 5.0 g/dL   AST 47 (H) 15 - 41 U/L   ALT 37 17 - 63 U/L   Alkaline Phosphatase 46 38 - 126 U/L   Total Bilirubin 1.4 (H) 0.3 - 1.2 mg/dL   GFR calc non Af Amer 47 (L) >60 mL/min   GFR calc Af Amer 55 (L) >60 mL/min   Anion gap 13 5 - 15  Urinalysis, Routine w reflex microscopic (not at [facility])  Result Value Ref Range   Color, Urine YELLOW YELLOW   APPearance CLEAR CLEAR   Specific Gravity, Urine 1.022 1.005 - 1.030   pH 5.0 5.0 - 8.0   Glucose, UA NEGATIVE NEGATIVE mg/dL   Hgb urine dipstick TRACE (A) NEGATIVE   Bilirubin Urine NEGATIVE NEGATIVE   Ketones, ur NEGATIVE NEGATIVE mg/dL  Protein, ur 30 (A) NEGATIVE mg/dL   Nitrite NEGATIVE NEGATIVE   Leukocytes, UA NEGATIVE NEGATIVE  Urine microscopic-add on  Result Value Ref Range   Squamous Epithelial / LPF 0-5 (A) NONE SEEN   WBC, UA NONE SEEN 0 - 5 WBC/hpf   RBC / HPF 0-5 0 - 5 RBC/hpf   Bacteria, UA RARE (A) NONE SEEN   Casts HYALINE CASTS (A) NEGATIVE  CBC  Result Value Ref Range   WBC 6.1 4.0 - 10.5 K/uL   RBC 4.67 4.22 - 5.81 MIL/uL   Hemoglobin 14.6 13.0 - 17.0 g/dL   HCT 40.6 39.0 - 52.0 %   MCV 86.9 78.0 - 100.0 fL   MCH 31.3 26.0 - 34.0 pg   MCHC 36.0 30.0 - 36.0 g/dL   RDW 14.0 11.5 - 15.5 %   Platelets 249 150 - 400 K/uL  Differential  Result Value Ref Range   Neutrophils Relative % 59 %   Neutro Abs 3.7 1.7 - 7.7 K/uL   Lymphocytes Relative 29 %   Lymphs Abs 1.8 0.7 - 4.0 K/uL   Monocytes Relative 8 %   Monocytes Absolute 0.5 0.1 - 1.0 K/uL   Eosinophils Relative 4 %   Eosinophils Absolute 0.2 0.0 - 0.7 K/uL   Basophils Relative 0 %   Basophils Absolute 0.0 0.0 - 0.1 K/uL  Urine rapid drug screen (hosp  performed)not at [facility]  Result Value Ref Range   Opiates NONE DETECTED NONE DETECTED   Cocaine NONE DETECTED NONE DETECTED   Benzodiazepines NONE DETECTED NONE DETECTED   Amphetamines NONE DETECTED NONE DETECTED   Tetrahydrocannabinol NONE DETECTED NONE DETECTED   Barbiturates NONE DETECTED NONE DETECTED  I-stat troponin, ED  Result Value Ref Range   Troponin i, poc 0.00 0.00 - 0.08 ng/mL   Comment 3          CBG monitoring, ED  Result Value Ref Range   Glucose-Capillary 202 (H) 65 - 99 mg/dL  I-Stat Chem 8, ED  Result Value Ref Range   Sodium 139 135 - 145 mmol/L   Potassium 4.1 3.5 - 5.1 mmol/L   Chloride 104 101 - 111 mmol/L   BUN 36 (H) 6 - 20 mg/dL   Creatinine, Ser 1.30 (H) 0.61 - 1.24 mg/dL   Glucose, Bld 243 (H) 65 - 99 mg/dL   Calcium, Ion 1.21 1.13 - 1.30 mmol/L   TCO2 26 0 - 100 mmol/L   Hemoglobin 15.6 13.0 - 17.0 g/dL   HCT 46.0 39.0 - 52.0 %  I-Stat Chem 8, ED  (not at [facility], [facility])  Result Value Ref Range   Sodium 140 135 - 145 mmol/L   Potassium 3.6 3.5 - 5.1 mmol/L   Chloride 102 101 - 111 mmol/L   BUN 26 (H) 6 - 20 mg/dL   Creatinine, Ser 1.20 0.61 - 1.24 mg/dL   Glucose, Bld 170 (H) 65 - 99 mg/dL   Calcium, Ion 1.20 1.12 - 1.23 mmol/L   TCO2 25 0 - 100 mmol/L   Hemoglobin 14.3 13.0 - 17.0 g/dL   HCT 42.0 39.0 - 52.0 %  I-stat troponin, ED (not at [facility], [facility])  Result Value Ref Range   Troponin i, poc 0.01 0.00 - 0.08 ng/mL   Comment 3           Ct Head Wo Contrast  09/23/2015  CLINICAL DATA:  Double vision. EXAM: CT HEAD WITHOUT CONTRAST TECHNIQUE: Contiguous axial images were obtained from the base of the  skull through the vertex without intravenous contrast. COMPARISON:  None. FINDINGS: No subdural, epidural, or subarachnoid hemorrhage. Ventricles and sulci are unremarkable. Cerebellum, brainstem, and basal cisterns are within normal limits. No mass, mass effect, or midline shift. Mild white matter changes. No acute cortical ischemia or infarct. IMPRESSION:  No acute abnormalities. Electronically Signed   By: Dorise Bullion III M.D   On: 09/23/2015 20:09   Mr Angiogram Head Wo Contrast  09/24/2015  CLINICAL DATA:  Double vision. Onset upon awakening. LEFT lateral gaze policy. Query proptosis. EXAM: MRI HEAD WITHOUT CONTRAST; MRA HEAD WITHOUT CONTRAST; MRI ORBITS WITHOUT CONTRAST TECHNIQUE: Multiplanar, multisequence MR imaging was performed. No intravenous contrast was administered. COMPARISON:  CT head 09/23/2015. FINDINGS: The patient was unable to remain motionless for the exam. Small or subtle lesions could be overlooked. MR BRAIN: No evidence for acute infarction, hemorrhage, mass lesion, or extra-axial fluid. Global atrophy with hydrocephalus ex vacuo. Prominent perivascular spaces suggest longstanding hypertension. Moderately advanced chronic microvascular ischemic change. Scattered areas of chronic lacunar infarction. No midline abnormality. Extracranial soft tissues unremarkable. MRA INTRACRANIAL: The internal carotid arteries are dolichoectatic but widely patent. The basilar artery is dolichoectatic but widely patent. Artifactual signal loss in the middle cerebral arteries and posterior cerebral arteries due to saturation of in plane flow. No proximal large vessel occlusion. No visible intracranial aneurysm. MR ORBITS: Globes are symmetric. No orbital mass or inflammation. Prominent perioptic CSF suggesting optic atrophy. Orbital musculature appears symmetric. No acute paranasal sinus disease or visible blowout injury. IMPRESSION: Motion degraded MR brain. No acute stroke. Atrophy and small vessel disease. No large vessel occlusion.  No intracranial aneurysm. Marginally diagnostic orbital MR. No orbital mass or inflammation. 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 Electronically Signed   By: Staci Righter M.D.   On: 09/24/2015 08:29   Mr Brain Wo Contrast  09/24/2015  CLINICAL DATA:  Double vision. Onset upon awakening. LEFT lateral gaze policy. Query proptosis.  EXAM: MRI HEAD WITHOUT CONTRAST; MRA HEAD WITHOUT CONTRAST; MRI ORBITS WITHOUT CONTRAST TECHNIQUE: Multiplanar, multisequence MR imaging was performed. No intravenous contrast was administered. COMPARISON:  CT head 09/23/2015. FINDINGS: The patient was unable to remain motionless for the exam. Small or subtle lesions could be overlooked. MR BRAIN: No evidence for acute infarction, hemorrhage, mass lesion, or extra-axial fluid. Global atrophy with hydrocephalus ex vacuo. Prominent perivascular spaces suggest longstanding hypertension. Moderately advanced chronic microvascular ischemic change. Scattered areas of chronic lacunar infarction. No midline abnormality. Extracranial soft tissues unremarkable. MRA INTRACRANIAL: The internal carotid arteries are dolichoectatic but widely patent. The basilar artery is dolichoectatic but widely patent. Artifactual signal loss in the middle cerebral arteries and posterior cerebral arteries due to saturation of in plane flow. No proximal large vessel occlusion. No visible intracranial aneurysm. MR ORBITS: Globes are symmetric. No orbital mass or inflammation. Prominent perioptic CSF suggesting optic atrophy. Orbital musculature appears symmetric. No acute paranasal sinus disease or visible blowout injury. IMPRESSION: Motion degraded MR brain. No acute stroke. Atrophy and small vessel disease. No large vessel occlusion.  No intracranial aneurysm. Marginally diagnostic orbital MR. No orbital mass or inflammation. 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 Electronically Signed   By: Staci Righter M.D.   On: 09/24/2015 08:29   Mr Farley Ly Cm  09/24/2015  CLINICAL DATA:  Double vision. Onset upon awakening. LEFT lateral gaze policy. Query proptosis. EXAM: MRI HEAD WITHOUT CONTRAST; MRA HEAD WITHOUT CONTRAST; MRI ORBITS WITHOUT CONTRAST TECHNIQUE: Multiplanar, multisequence MR imaging was performed. No intravenous contrast was administered. COMPARISON:  CT head  09/23/2015. FINDINGS: The patient  was unable to remain motionless for the exam. Small or subtle lesions could be overlooked. MR BRAIN: No evidence for acute infarction, hemorrhage, mass lesion, or extra-axial fluid. Global atrophy with hydrocephalus ex vacuo. Prominent perivascular spaces suggest longstanding hypertension. Moderately advanced chronic microvascular ischemic change. Scattered areas of chronic lacunar infarction. No midline abnormality. Extracranial soft tissues unremarkable. MRA INTRACRANIAL: The internal carotid arteries are dolichoectatic but widely patent. The basilar artery is dolichoectatic but widely patent. Artifactual signal loss in the middle cerebral arteries and posterior cerebral arteries due to saturation of in plane flow. No proximal large vessel occlusion. No visible intracranial aneurysm. MR ORBITS: Globes are symmetric. No orbital mass or inflammation. Prominent perioptic CSF suggesting optic atrophy. Orbital musculature appears symmetric. No acute paranasal sinus disease or visible blowout injury. IMPRESSION: Motion degraded MR brain. No acute stroke. Atrophy and small vessel disease. No large vessel occlusion.  No intracranial aneurysm. Marginally diagnostic orbital MR. No orbital mass or inflammation. 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 Electronically Signed   By: Staci Righter M.D.   On: 09/24/2015 08:29   Renal insufficiency is chronic. 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

## 2015-09-26 ENCOUNTER — Ambulatory Visit (INDEPENDENT_AMBULATORY_CARE_PROVIDER_SITE_OTHER): Payer: PPO | Admitting: Family Medicine

## 2015-09-26 ENCOUNTER — Encounter: Payer: Self-pay | Admitting: Family Medicine

## 2015-09-26 VITALS — BP 146/90 | HR 96 | Temp 98.3°F | Wt 265.2 lb

## 2015-09-26 DIAGNOSIS — H4920 Sixth [abducent] nerve palsy, unspecified eye: Secondary | ICD-10-CM | POA: Insufficient documentation

## 2015-09-26 DIAGNOSIS — H4922 Sixth [abducent] nerve palsy, left eye: Secondary | ICD-10-CM | POA: Diagnosis not present

## 2015-09-26 MED ORDER — INSULIN GLARGINE 100 UNIT/ML ~~LOC~~ SOLN: 95.0000 [IU] | SUBCUTANEOUS | Status: DC

## 2015-09-26 NOTE — Progress Notes (Signed)
Pre visit review using our clinic review tool, if applicable. No additional management support is needed unless otherwise documented below in the visit note.  Sugars have been diffusely high 200-300s recently.  Had used variable insulin dosing.  D/w pt.  See AVS.  He'll likely have to adjust his insulin in the upcoming days.  D/w pt about limiting sugar in diet.    Had 1 day of double vision, then called the clinic.  Seen at ER with L 6th nerve palsy.  CT neg.  MRI w/o CVA.  D/w pt.  Only with L lateral gaze changes.  No other vision changes.  No other focal neuro sx.  Has eye patched in the meantime.    PMH and SH reviewed  ROS: Per HPI unless specifically indicated in ROS section   Meds, vitals, and allergies reviewed.   GEN: nad, alert and oriented, ncat HEENT: mucous membranes moist NECK: supple w/o LA CV: rrr.  no murmur PULM: ctab, no inc wob CN 2-12 wnl B, S/S/DTR wnl x4 except for L 6th nerve palsy.

## 2015-09-26 NOTE — Assessment & Plan Note (Signed)
Anatomy and path/phys d/w pt.  Likely a 6th CN issue with partial palsy, not a muscular failure.  No CVA on exam.  Has f/u with eye clinic pending.  Continue patching in the meantime.  Okay for outpatient f/u.  D/w pt about routine care, including DM care.  See AVS re: insulin plans.  Recheck A1c in a few weeks. He agrees.  >25 minutes spent in face to face time with patient, >50% spent in counselling or coordination of care.

## 2015-09-26 NOTE — Patient Instructions (Addendum)
Start back on 95 units a day.  Add a unit a day until your AM sugar is better, <160.   Update me as needed.  See about getting a black out clip for your glasses.  Keep the eye appointment.  Take care.  Glad to see you.

## 2015-10-02 DIAGNOSIS — D3131 Benign neoplasm of right choroid: Secondary | ICD-10-CM | POA: Diagnosis not present

## 2015-10-02 DIAGNOSIS — H401122 Primary open-angle glaucoma, left eye, moderate stage: Secondary | ICD-10-CM | POA: Diagnosis not present

## 2015-10-02 DIAGNOSIS — H02831 Dermatochalasis of right upper eyelid: Secondary | ICD-10-CM | POA: Diagnosis not present

## 2015-10-02 DIAGNOSIS — H4922 Sixth [abducent] nerve palsy, left eye: Secondary | ICD-10-CM | POA: Diagnosis not present

## 2015-10-02 DIAGNOSIS — H532 Diplopia: Secondary | ICD-10-CM | POA: Diagnosis not present

## 2015-10-02 DIAGNOSIS — H401111 Primary open-angle glaucoma, right eye, mild stage: Secondary | ICD-10-CM | POA: Diagnosis not present

## 2015-10-02 DIAGNOSIS — E119 Type 2 diabetes mellitus without complications: Secondary | ICD-10-CM | POA: Diagnosis not present

## 2015-10-02 DIAGNOSIS — H02834 Dermatochalasis of left upper eyelid: Secondary | ICD-10-CM | POA: Diagnosis not present

## 2015-10-02 DIAGNOSIS — H2513 Age-related nuclear cataract, bilateral: Secondary | ICD-10-CM | POA: Diagnosis not present

## 2015-10-02 DIAGNOSIS — H10413 Chronic giant papillary conjunctivitis, bilateral: Secondary | ICD-10-CM | POA: Diagnosis not present

## 2015-10-02 LAB — HM DIABETES EYE EXAM

## 2015-10-06 ENCOUNTER — Other Ambulatory Visit: Payer: Self-pay | Admitting: Family Medicine

## 2015-10-06 DIAGNOSIS — Z125 Encounter for screening for malignant neoplasm of prostate: Secondary | ICD-10-CM

## 2015-10-06 DIAGNOSIS — E1169 Type 2 diabetes mellitus with other specified complication: Secondary | ICD-10-CM

## 2015-10-06 DIAGNOSIS — E669 Obesity, unspecified: Principal | ICD-10-CM

## 2015-10-06 DIAGNOSIS — E790 Hyperuricemia without signs of inflammatory arthritis and tophaceous disease: Secondary | ICD-10-CM

## 2015-10-07 ENCOUNTER — Other Ambulatory Visit (INDEPENDENT_AMBULATORY_CARE_PROVIDER_SITE_OTHER): Payer: PPO

## 2015-10-07 DIAGNOSIS — E669 Obesity, unspecified: Secondary | ICD-10-CM

## 2015-10-07 DIAGNOSIS — E1169 Type 2 diabetes mellitus with other specified complication: Secondary | ICD-10-CM

## 2015-10-07 DIAGNOSIS — E790 Hyperuricemia without signs of inflammatory arthritis and tophaceous disease: Secondary | ICD-10-CM

## 2015-10-07 DIAGNOSIS — Z125 Encounter for screening for malignant neoplasm of prostate: Secondary | ICD-10-CM

## 2015-10-07 DIAGNOSIS — R7989 Other specified abnormal findings of blood chemistry: Secondary | ICD-10-CM

## 2015-10-07 DIAGNOSIS — E119 Type 2 diabetes mellitus without complications: Secondary | ICD-10-CM

## 2015-10-07 LAB — COMPREHENSIVE METABOLIC PANEL
ALT: 24 U/L (ref 0–53)
AST: 25 U/L (ref 0–37)
Albumin: 4.1 g/dL (ref 3.5–5.2)
Alkaline Phosphatase: 46 U/L (ref 39–117)
BUN: 31 mg/dL — ABNORMAL HIGH (ref 6–23)
CO2: 26 mEq/L (ref 19–32)
Calcium: 10.4 mg/dL (ref 8.4–10.5)
Chloride: 99 mEq/L (ref 96–112)
Creatinine, Ser: 1.43 mg/dL (ref 0.40–1.50)
GFR: 51.3 mL/min — ABNORMAL LOW (ref >60.00)
Glucose, Bld: 251 mg/dL — ABNORMAL HIGH (ref 70–99)
Potassium: 3.9 mEq/L (ref 3.5–5.1)
Sodium: 135 mEq/L (ref 135–145)
Total Bilirubin: 0.6 mg/dL (ref 0.2–1.2)
Total Protein: 7.5 g/dL (ref 6.0–8.3)

## 2015-10-07 LAB — PSA, MEDICARE: PSA: 0.48 ng/ml (ref 0.10–4.00)

## 2015-10-07 LAB — LIPID PANEL
Cholesterol: 256 mg/dL — ABNORMAL HIGH (ref 0–200)
HDL: 32.2 mg/dL — ABNORMAL LOW (ref >39.00)
Total CHOL/HDL Ratio: 8
Triglycerides: 943 mg/dL — ABNORMAL HIGH (ref 0.0–149.0)

## 2015-10-07 LAB — HEMOGLOBIN A1C: Hgb A1c MFr Bld: 9.7 % — ABNORMAL HIGH (ref 4.6–6.5)

## 2015-10-07 LAB — LDL CHOLESTEROL, DIRECT: Direct LDL: 62 mg/dL

## 2015-10-07 LAB — URIC ACID: Uric Acid, Serum: 5.6 mg/dL (ref 4.0–7.8)

## 2015-10-08 ENCOUNTER — Encounter: Payer: Self-pay | Admitting: Gastroenterology

## 2015-10-14 ENCOUNTER — Ambulatory Visit (INDEPENDENT_AMBULATORY_CARE_PROVIDER_SITE_OTHER): Payer: PPO

## 2015-10-14 VITALS — BP 142/90 | HR 96 | Temp 98.5°F | Ht 69.5 in | Wt 266.5 lb

## 2015-10-14 DIAGNOSIS — Z0000 Encounter for general adult medical examination without abnormal findings: Secondary | ICD-10-CM

## 2015-10-14 NOTE — Progress Notes (Signed)
PCP notes:  Health maintenance:  No gaps identified or addressed  Abnormal screenings:   Hearing - failed Fall risk - hx of fall without injury  Patient concerns: Pt is having difficulty affording Lantus. Pt is ineligible for savings card because of Medicare per web site.   Nurse concerns: None  Next PCP appt: 10/15/15 @ 0830  I reviewed health advisor's note, was available for consultation on the day of service listed in this note, and agree with documentation and plan. Elsie Stain, MD.

## 2015-10-14 NOTE — Progress Notes (Signed)
Subjective:   [name] is a 75 y.o. male who presents for an Initial Medicare Annual Wellness Visit.  Review of Systems  N/A Cardiac Risk Factors include: advanced age (>49men, >79 women);diabetes mellitus;male gender;obesity (BMI >30kg/m2);hypertension;dyslipidemia    Objective:    Today's Vitals   10/14/15 1049 10/14/15 1053  BP: 142/90   Pulse: 96   Temp: 98.5 F (36.9 C)   TempSrc: Oral   Height: 5' 9.5" (1.765 m)   Weight: 266 lb 8 oz (120.884 kg)   SpO2: 97%   PainSc: 1  1   PainLoc: Hip    Body mass index is 38.8 kg/(m^2).  Current Medications (verified) Outpatient Encounter Prescriptions as of 10/14/2015  Medication Sig  . allopurinol (ZYLOPRIM) 300 MG tablet Take 0.5 tablets (150 mg total) by mouth every morning. Take half a tablet (150mg ) by mouth daily  . amLODipine (NORVASC) 10 MG tablet Take 1 tablet (10 mg total) by mouth daily.  [name] aspirin 81 MG tablet Take 81 mg by mouth daily.  [name] atorvastatin (LIPITOR) 10 MG tablet Take 1 tablet (10 mg total) by mouth at bedtime.  [name] gemfibrozil (LOPID) 600 MG tablet Take 600 mg by mouth 2 (two) times daily.   [name] glipiZIDE (GLUCOTROL) 10 MG tablet Take 1 tablet (10 mg total) by mouth daily before breakfast.  . insulin glargine (LANTUS) 100 UNIT/ML injection Inject 0.95 mLs (95 Units total) into the skin every morning. Disp 5 pens at once  . latanoprost (XALATAN) 0.005 % ophthalmic solution Place 1 drop into both eyes at bedtime.  [name] losartan (COZAAR) 100 MG tablet Take 100 mg by mouth daily.  . metFORMIN (GLUCOPHAGE) 500 MG tablet Take 1 tablet (500 mg total) by mouth 2 (two) times daily with a meal.  . Multiple Vitamins-Minerals (VITRUM 50+ SENIOR MULTI PO) Take 1 tablet by mouth daily.   . polyethylene glycol (MIRALAX / GLYCOLAX) packet Take 17 g by mouth 2 (two) times daily. (Patient taking differently: Take 17 g by mouth daily as needed for mild constipation. )  . sildenafil (REVATIO) 20 MG tablet Take 3-5 tablets  (60-100 mg total) by mouth daily as needed. (Patient taking differently: Take 60-100 mg by mouth daily as needed. )   No facility-administered encounter medications on file as of 10/14/2015.    Allergies (verified) Morphine and related   History: Past Medical History  Diagnosis Date  . Hypertension   . Diabetes mellitus type 2 in obese ([place])   . PONV (postoperative nausea and vomiting)   . GERD (gastroesophageal reflux disease)     occ indigestion  . Arthritis     arthirtis all over  . Right knee DJD   . Hyperlipemia   . Frequent urination at night   . Primary osteoarthritis of right knee   . Primary localized osteoarthritis of right hip   . Spinal stenosis of lumbar region with radiculopathy     s/p L4/L5 transforaminal blocks  . PONV (postoperative nausea and vomiting)   . Glaucoma     both eyes  . Restless leg syndrome   . Concussion     multiple- college football player  . Kidney stones    Past Surgical History  Procedure Laterality Date  . Lithotripsy    . Shoulder surgery      right  . Elbow surgery      right  . Abdominal hernia repair    . Total knee arthroplasty Left 07/2006    left knee  .  Hernia repair  1980    inguinal  . Shoulder arthroscopy  1980    right  . Elbow arthroscopy  1990    right  . Stone extraction with basket  2010  . Joint replacement  2008    knee  . Lumbar disc surgery      2015  . Total knee arthroplasty Right 01/08/2014    Procedure: RIGHT TOTAL KNEE ARTHROPLASTY;  Surgeon: Lorn Junes, MD;  Location: [facility];  Service: Orthopedics;  Laterality: Right;  . Back surgery    . Total hip arthroplasty Right 11/06/2014    Procedure: RIGHT TOTAL HIP ARTHROPLASTY ANTERIOR APPROACH;  Surgeon: Renette Butters, MD;  Location: [facility];  Service: Orthopedics;  Laterality: Right;  . Hip closed reduction Right 11/09/2014    Procedure: CLOSED REDUCTION HIP, s/p total hip 8/9;  Surgeon: Ninetta Lights, MD;  Location: [facility];  Service: Orthopedics;   Laterality: Right;  . Colonoscopy    . Revision total hip arthroplasty Right 01/08/2015  . Anterior hip revision Right 01/08/2015    Procedure: RIGHT ANTERIOR HIP REVISION;  Surgeon: Renette Butters, MD;  Location: [facility];  Service: Orthopedics;  Laterality: Right;   Family History  Problem Relation Age of Onset  . Hypertension Mother   . Kidney disease Mother   . Hypertension Father   . Diabetes Mellitus II Father   . Diabetes Mellitus II Brother   . Diabetes Mellitus II Brother   . Cancer - Prostate Brother   . Colon cancer Neg Hx    Social History   Occupational History  . Not on file.   Social History Main Topics  . Smoking status: Never Smoker   . Smokeless tobacco: Never Used  . Alcohol Use: Yes     Comment: glass of wine every 3-4 months  . Drug Use: No  . Sexual Activity: No   Tobacco Counseling Counseling given: No   Activities of Daily Living In your present state of health, do you have any difficulty performing the following activities: 10/14/2015 01/09/2015  Hearing? N -  Vision? Y -  Difficulty concentrating or making decisions? N -  Walking or climbing stairs? N -  Dressing or bathing? N -  Doing errands, shopping? N Y  Conservation officer, nature and eating ? N -  Using the Toilet? N -  In the past six months, have you accidently leaked urine? N -  Do you have problems with loss of bowel control? N -  Managing your Medications? N -  Managing your Finances? N -  Housekeeping or managing your Housekeeping? N -    Immunizations and Health Maintenance Immunization History  Administered Date(s) Administered  . Influenza-Unspecified 03/11/2015  . Pneumococcal Conjugate-13 03/26/2014  . Pneumococcal Polysaccharide-23 02/07/2010  . Tdap 03/26/2006  . Zoster 04/01/2009   There are no preventive care reminders to display for this patient.  Patient Care Team: Tonia Ghent, MD as PCP - General (Family Medicine) Clent Jacks, MD as Consulting Physician  (Ophthalmology)  Indicate any recent Medical Services you may have received from other than [facility] providers in the past year (date may be approximate).    Assessment:   This is a routine wellness examination for Kay.   Hearing/Vision screen  Hearing Screening   125Hz  250Hz  500Hz  1000Hz  2000Hz  4000Hz  8000Hz   Right ear:   40 40 40 0   Left ear:   40 40 40 0   Vision Screening Comments: Last vision exam with  Dr. Katy Fitch in July 2017  Dietary issues and exercise activities discussed: Current Exercise Habits: Home exercise routine, Type of exercise: strength training/weights;treadmill, Time (Minutes): 30, Frequency (Times/Week): 5, Weekly Exercise (Minutes/Week): 150, Intensity: Moderate, Exercise limited by: None identified  Goals    . Increase physical activity     Starting 10/14/2015, I will continue to exercise for at least 30 minutes 4-5 days per week.       Depression Screen PHQ 2/9 Scores 10/14/2015 02/11/2015  PHQ - 2 Score 0 0    Fall Risk Fall Risk  10/14/2015  Falls in the past year? Yes  Number falls in past yr: 1  Injury with Fall? No  Follow up Falls evaluation completed    Cognitive Function: MMSE - Mini Mental State Exam 10/14/2015  Orientation to time 5  Orientation to Place 5  Registration 3  Attention/ Calculation 0  Recall 3  Language- name 2 objects 0  Language- repeat 1  Language- follow 3 step command 3  Language- read & follow direction 0  Write a sentence 0  Copy design 0  Total score 20   PLEASE NOTE: A Mini-Cog screen was completed. Maximum score is 20. A value of 0 denotes this part of [place] MMSE was not completed or the patient failed this part of the Mini-Cog screening.   Mini-Cog Screening Orientation to Time - Max 5 pts Orientation to Place - Max 5 pts Registration - Max 3 pts Recall - Max 3 pts Language Repeat - Max 1 pts Language Follow 3 Step Command - Max 3 pts  Screening Tests Health Maintenance  Topic Date Due  . INFLUENZA  VACCINE  10/29/2015  . COLONOSCOPY  11/21/2015  . FOOT EXAM  02/26/2016  . TETANUS/TDAP  03/26/2016  . HEMOGLOBIN A1C  04/08/2016  . OPHTHALMOLOGY EXAM  10/01/2016  . ZOSTAVAX  Completed  . PNA vac Low Risk Adult  Completed        Plan:     I have personally reviewed and addressed the Medicare Annual Wellness questionnaire and have noted the following in the patient's chart:  A. Medical and social history B. Use of alcohol, tobacco or illicit drugs  C. Current medications and supplements D. Functional ability and status E.  Nutritional status F.  Physical activity G. Advance directives H. List of other physicians I.  Hospitalizations, surgeries, and ER visits in previous 12 months J.  [place] to include hearing, vision, cognitive, depression L. Referrals and appointments - none  In addition, I have reviewed and discussed with patient certain preventive protocols, quality metrics, and best practice recommendations. A written personalized care plan for preventive services as well as general preventive health recommendations were provided to patient.  See attached scanned questionnaire for additional information.   Signed,   Lindell Noe, MHA, BS, LPN Health Advisor

## 2015-10-14 NOTE — Progress Notes (Signed)
Pre visit review using our clinic review tool, if applicable. No additional management support is needed unless otherwise documented below in the visit note. 

## 2015-10-14 NOTE — Patient Instructions (Signed)
[name] , Thank you for taking time to come for your Medicare Wellness Visit. I appreciate your ongoing commitment to your health goals. Please review the following plan we discussed and let me know if I can assist you in the future.   These are the goals we discussed: Goals    . Increase physical activity     Starting 10/14/2015, I will continue to exercise for at least 30 minutes 4-5 days per week.        This is a list of the screening recommended for you and due dates:  Health Maintenance  Topic Date Due  . Flu Shot  10/29/2015  . Colon Cancer Screening  11/21/2015  . Complete foot exam   02/26/2016  . Tetanus Vaccine  03/26/2016  . Hemoglobin A1C  04/08/2016  . Eye exam for diabetics  10/01/2016  . Shingles Vaccine  Completed  . Pneumonia vaccines  Completed    Preventive Care for Adults  A healthy lifestyle and preventive care can promote health and wellness. Preventive health guidelines for adults include the following key practices.  . A routine yearly physical is a good way to check with your health care provider about your health and preventive screening. It is a chance to share any concerns and updates on your health and to receive a thorough exam.  . Visit your dentist for a routine exam and preventive care every 6 months. Brush your teeth twice a day and floss once a day. Good oral hygiene prevents tooth decay and gum disease.  . The frequency of eye exams is based on your age, health, family medical history, use  of contact lenses, and other factors. Follow your health care provider's ecommendations for frequency of eye exams.  . Eat a healthy diet. Foods like vegetables, fruits, whole grains, low-fat dairy products, and lean protein foods contain the nutrients you need without too many calories. Decrease your intake of foods high in solid fats, added sugars, and salt. Eat the right amount of calories for you. Get information about a proper diet from your health  care provider, if necessary.  . Regular physical exercise is one of the most important things you can do for your health. Most adults should get at least 150 minutes of moderate-intensity exercise (any activity that increases your heart rate and causes you to sweat) each week. In addition, most adults need muscle-strengthening exercises on 2 or more days a week.  Silver Sneakers may be a benefit available to you. To determine eligibility, you may visit the website: www.silversneakers.com or contact program at 502-053-1788 Mon-Fri between 8AM-8PM.   . Maintain a healthy weight. The body mass index (BMI) is a screening tool to identify possible weight problems. It provides an estimate of body fat based on height and weight. Your health care provider can find your BMI and can help you achieve or maintain a healthy weight.   For adults 20 years and older: ? A BMI below 18.5 is considered underweight. ? A BMI of 18.5 to 24.9 is normal. ? A BMI of 25 to 29.9 is considered overweight. ? A BMI of 30 and above is considered obese.   . Maintain normal blood lipids and cholesterol levels by exercising and minimizing your intake of saturated fat. Eat a balanced diet with plenty of fruit and vegetables. Blood tests for lipids and cholesterol should begin at age 41 and be repeated every 5 years. If your lipid or cholesterol levels are high, you are over  50, or you are at high risk for heart disease, you may need your cholesterol levels checked more frequently. Ongoing high lipid and cholesterol levels should be treated with medicines if diet and exercise are not working.  . If you smoke, find out from your health care provider how to quit. If you do not use tobacco, please do not start.  . If you choose to drink alcohol, please do not consume more than 2 drinks per day. One drink is considered to be 12 ounces (355 mL) of beer, 5 ounces (148 mL) of wine, or 1.5 ounces (44 mL) of liquor.  . If you are 49-42  years old, ask your health care provider if you should take aspirin to prevent strokes.  . Use sunscreen. Apply sunscreen liberally and repeatedly throughout the day. You should seek shade when your shadow is shorter than you. Protect yourself by wearing long sleeves, pants, a wide-brimmed hat, and sunglasses year round, whenever you are outdoors.  . Once a month, do a whole body skin exam, using a mirror to look at the skin on your back. Tell your health care provider of new moles, moles that have irregular borders, moles that are larger than a pencil eraser, or moles that have changed in shape or color.

## 2015-10-15 ENCOUNTER — Ambulatory Visit (INDEPENDENT_AMBULATORY_CARE_PROVIDER_SITE_OTHER): Payer: PPO | Admitting: Family Medicine

## 2015-10-15 ENCOUNTER — Encounter: Payer: Self-pay | Admitting: Family Medicine

## 2015-10-15 VITALS — BP 152/84 | HR 91 | Temp 98.5°F | Wt 269.5 lb

## 2015-10-15 DIAGNOSIS — E1169 Type 2 diabetes mellitus with other specified complication: Secondary | ICD-10-CM

## 2015-10-15 DIAGNOSIS — E119 Type 2 diabetes mellitus without complications: Secondary | ICD-10-CM | POA: Diagnosis not present

## 2015-10-15 DIAGNOSIS — H4922 Sixth [abducent] nerve palsy, left eye: Secondary | ICD-10-CM | POA: Diagnosis not present

## 2015-10-15 DIAGNOSIS — E669 Obesity, unspecified: Secondary | ICD-10-CM | POA: Diagnosis not present

## 2015-10-15 MED ORDER — INSULIN GLARGINE 100 UNIT/ML ~~LOC~~ SOLN: 130.0000 [IU] | SUBCUTANEOUS | Status: DC

## 2015-10-15 MED ORDER — METFORMIN HCL ER 500 MG PO TB24: 1000.0000 mg | ORAL_TABLET | Freq: Two times a day (BID) | ORAL | Status: DC

## 2015-10-15 NOTE — Progress Notes (Signed)
Pre visit review using our clinic review tool, if applicable. No additional management support is needed unless otherwise documented below in the visit note.  Still with L eye patched.  In the AM, just after getting up, his vision is back to normal.  The double vision then comes back later in the AM but not with as much separation as prev.  This means he is likely getting better, d/w pt.  He has f/u with eye clinic pending.    Diabetes:  Using medications without difficulties:yes Hypoglycemic episodes:no Hyperglycemic episodes:no Feet problems: some occ tingling in the feet, at baseline, not worse recently Blood Sugars averaging: from 200-300 usually eye exam within last year: yes Had been on metformin 1000mg  a day.  D/w pt.  Had been able to tolerate 2000mg  a day in the past.   a1c d/w pt.   D/w pt about diet.  He hasn't been as strict on his diet recently, d/w pt.   Meds, vitals, and allergies reviewed.   ROS: Per HPI unless specifically indicated in ROS section   GEN: nad, alert and oriented HEENT: mucous membranes moist NECK: supple w/o LA CV: rrr. PULM: ctab, no inc wob ABD: soft, +bs EXT: no edema SKIN: no acute rash L eye patched.

## 2015-10-15 NOTE — Assessment & Plan Note (Addendum)
Had been on metformin 1000mg  a day.  D/w pt.  Had been able to tolerate 2000mg  a day in the past.   a1c d/w pt.   D/w pt about diet.  He hasn't been as strict on his diet recently, d/w pt.  Inc metformin in meantime.  See AVS.  Refer back for DM2 refresher education esp re: diet.  He'll update me about his sugars next week, after med change.   We'll likely have to start mealtime insulin in addition to his current long acting insulin.  D/w pt.  He agrees. >25 minutes spent in face to face time with patient, >50% spent in counselling or coordination of care.

## 2015-10-15 NOTE — Patient Instructions (Signed)
[name] will call about your referral.  See her on the way out Increase your metformin to 2 pills twice a day (extended release). Update me about your sugars next week.   We'll likely have to start mealtime insulin in addition to your current long acting insulin.  Take care.  Glad to see you.

## 2015-10-16 NOTE — Assessment & Plan Note (Signed)
Improving, not yet resolved, await eye clinic input.  Continue patching for now.

## 2015-10-22 ENCOUNTER — Encounter: Payer: Self-pay | Admitting: Gastroenterology

## 2015-10-23 ENCOUNTER — Encounter: Payer: Self-pay | Admitting: Family Medicine

## 2015-10-27 ENCOUNTER — Encounter: Payer: Self-pay | Admitting: Family Medicine

## 2015-10-31 ENCOUNTER — Encounter: Payer: Self-pay | Admitting: Skilled Nursing Facility1

## 2015-10-31 ENCOUNTER — Encounter: Payer: PPO | Attending: Family Medicine | Admitting: Skilled Nursing Facility1

## 2015-10-31 DIAGNOSIS — Z713 Dietary counseling and surveillance: Secondary | ICD-10-CM | POA: Insufficient documentation

## 2015-10-31 DIAGNOSIS — Z6835 Body mass index (BMI) 35.0-35.9, adult: Secondary | ICD-10-CM | POA: Diagnosis not present

## 2015-10-31 DIAGNOSIS — E669 Obesity, unspecified: Secondary | ICD-10-CM

## 2015-10-31 DIAGNOSIS — E119 Type 2 diabetes mellitus without complications: Secondary | ICD-10-CM | POA: Diagnosis not present

## 2015-10-31 DIAGNOSIS — E1169 Type 2 diabetes mellitus with other specified complication: Secondary | ICD-10-CM

## 2015-10-31 NOTE — Patient Instructions (Addendum)
-  Do not use a needle more than once  -To properly dispose of your needles (insulin and pricking)  Only use a hard plastic container (for example: a laundry detergent bottle)  When the bottle is full be sure to have a secured lid and write DO NOT RECYCLE on the outside of it -Try checking your sugar using both hands -Look into getting a referral for an endocrinologist  -Try to think of ways to control your stress -Breakfast idea: piece of whole wheat toast, 2 eggs, and half a grapefruit -Snack: crackers and cheese -Snack: peanut butter and apple -One hotdog with slaw and mustard and onion with green beans  -Have your dessert with your meals -Try a glass of fat free milk at night -Talk to your physician about using physical therapy for your hip and thigh -Take your glucometer everywhere with you

## 2015-10-31 NOTE — Progress Notes (Signed)
Diabetes Self-Management Education  Visit Type: First/Initial  Appt. Start Time: 9:30 Appt. End Time: 11:15  10/31/2015  [name], identified by name and date of birth, is a 75 y.o. male with a diagnosis of Diabetes: Type 2.   ASSESSMENT  Height 6' (1.829 m), weight 264 lb (119.7 kg). Body mass index is 35.8 kg/m.  Pt states the extended release metformin does not work as well as the regular type but he gets lose stools with taking it. Pt states he gets hard stools often. Pt states the glipizide and metformin has always worked better for controlling his blood glucose than the insulin, pt states he has had no improvement with insulin including increasing the dose.      Diabetes Self-Management Education - 10/31/15 0943      Visit Information   Visit Type First/Initial     Initial Visit   Diabetes Type Type 2   Are you currently following a meal plan? No   Are you taking your medications as prescribed? Yes   Date Diagnosed 6 years ago     Health Coping   How would you rate your overall health? Good     Psychosocial Assessment   Patient Belief/Attitude about Diabetes Motivated to manage diabetes   Self-management support Family   Other persons present Spouse/SO   Patient Concerns Nutrition/Meal planning;Glycemic Control;Weight Control   Special Needs None     Pre-Education Assessment   Patient understands the diabetes disease and treatment process. Needs Instruction   Patient understands incorporating nutritional management into lifestyle. Needs Instruction   Patient undertands incorporating physical activity into lifestyle. Needs Instruction   Patient understands using medications safely. Needs Instruction   Patient understands monitoring blood glucose, interpreting and using results Needs Instruction   Patient understands prevention, detection, and treatment of acute complications. Needs Instruction   Patient understands prevention, detection, and treatment of  chronic complications. Needs Instruction   Patient understands how to develop strategies to address psychosocial issues. Needs Instruction   Patient understands how to develop strategies to promote health/change behavior. Needs Instruction     Complications   Last HgB A1C per patient/outside source 9.7 %   How often do you check your blood sugar? > 4 times/day   Fasting Blood glucose range (mg/dL) >200   Postprandial Blood glucose range (mg/dL) >200   Number of hyperglycemic episodes per week 7   Can you tell when your blood sugar is high? No   Have you had a dilated eye exam in the past 12 months? Yes   Have you had a dental exam in the past 12 months? Yes   Are you checking your feet? Yes   How many days per week are you checking your feet? 7     Dietary Intake   Breakfast eggs and cheese and canned ham   Snack (morning) cheese   Lunch 6 inch sub   Dinner kilbosa sauage and salad   Snack (evening) peanut butter   Beverage(s) water, soda     Exercise   Exercise Type Light (walking / raking leaves)   How many days per week to you exercise? 4   How many minutes per day do you exercise? 30   Total minutes per week of exercise 120     Patient Education   Previous Diabetes Education No   Disease state  Factors that contribute to the development of diabetes;Definition of diabetes, type 1 and 2, and the diagnosis of diabetes   Nutrition  management  Role of diet in the treatment of diabetes and the relationship between the three main macronutrients and blood glucose level;Food label reading, portion sizes and measuring food.;Carbohydrate counting;Reviewed blood glucose goals for pre and post meals and how to evaluate the patients' food intake on their blood glucose level.;Meal timing in regards to the patients' current diabetes medication.;Information on hints to eating out and maintain blood glucose control.;Meal options for control of blood glucose level and chronic complications.    Physical activity and exercise  Role of exercise on diabetes management, blood pressure control and cardiac health.;Identified with patient nutritional and/or medication changes necessary with exercise.   Medications Taught/reviewed insulin injection, site rotation, insulin storage and needle disposal.   Monitoring Purpose and frequency of SMBG.;Yearly dilated eye exam;Daily foot exams;Identified appropriate SMBG and/or A1C goals.   Chronic complications Relationship between chronic complications and blood glucose control;Lipid levels, blood glucose control and heart disease;Dental care;Retinopathy and reason for yearly dilated eye exams;Assessed and discussed foot care and prevention of foot problems   Psychosocial adjustment Worked with patient to identify barriers to care and solutions;Role of stress on diabetes     Individualized Goals (developed by patient)   Nutrition Follow meal plan discussed;Adjust meds/carbs with exercise as discussed   Physical Activity Exercise 3-5 times per week;15 minutes per day   Medications take my medication as prescribed     Post-Education Assessment   Patient understands the diabetes disease and treatment process. Demonstrates understanding / competency   Patient understands incorporating nutritional management into lifestyle. Demonstrates understanding / competency   Patient undertands incorporating physical activity into lifestyle. Demonstrates understanding / competency   Patient understands using medications safely. Demonstrates understanding / competency   Patient understands monitoring blood glucose, interpreting and using results Demonstrates understanding / competency   Patient understands prevention, detection, and treatment of acute complications. Demonstrates understanding / competency   Patient understands prevention, detection, and treatment of chronic complications. Demonstrates understanding / competency   Patient understands how to develop  strategies to address psychosocial issues. Demonstrates understanding / competency   Patient understands how to develop strategies to promote health/change behavior. Demonstrates understanding / competency     Outcomes   Expected Outcomes Demonstrated interest in learning. Expect positive outcomes   Future DMSE PRN   Program Status Completed      Individualized Plan for Diabetes Self-Management Training:   Learning Objective:  Patient will have a greater understanding of diabetes self-management. Patient education plan is to attend individual and/or group sessions per assessed needs and concerns.   Plan:   Patient Instructions  -Do not use a needle more than once  -To properly dispose of your needles (insulin and pricking)  Only use a hard plastic container (for example: a laundry detergent bottle)  When the bottle is full be sure to have a secured lid and write DO NOT RECYCLE on the outside of it -Try checking your sugar using both hands -Look into getting a referral for an endocrinologist  -Try to think of ways to control your stress -Breakfast idea: piece of whole wheat toast, 2 eggs, and half a grapefruit -Snack: crackers and cheese -Snack: peanut butter and apple -One hotdog with slaw and mustard and onion with green beans  -Have your dessert with your meals -Try a glass of fat free milk at night -Talk to your physician about using physical therapy for your hip and thigh -Take your glucometer everywhere with you    Expected Outcomes:  Demonstrated interest in  learning. Expect positive outcomes  Education material provided: Living Well with Diabetes, Meal plan card, My Plate, Snack sheet and Support group flyer  If problems or questions, patient to contact team via:  Phone  Future DSME appointment: PRN

## 2015-11-01 DIAGNOSIS — H4921 Sixth [abducent] nerve palsy, right eye: Secondary | ICD-10-CM | POA: Diagnosis not present

## 2015-11-01 DIAGNOSIS — H532 Diplopia: Secondary | ICD-10-CM | POA: Diagnosis not present

## 2015-11-01 DIAGNOSIS — H2513 Age-related nuclear cataract, bilateral: Secondary | ICD-10-CM | POA: Diagnosis not present

## 2015-11-01 DIAGNOSIS — H401122 Primary open-angle glaucoma, left eye, moderate stage: Secondary | ICD-10-CM | POA: Diagnosis not present

## 2015-11-01 DIAGNOSIS — H401111 Primary open-angle glaucoma, right eye, mild stage: Secondary | ICD-10-CM | POA: Diagnosis not present

## 2015-11-01 DIAGNOSIS — H4922 Sixth [abducent] nerve palsy, left eye: Secondary | ICD-10-CM | POA: Diagnosis not present

## 2015-11-07 ENCOUNTER — Encounter: Payer: Self-pay | Admitting: Family Medicine

## 2015-11-12 ENCOUNTER — Encounter: Payer: Self-pay | Admitting: Family Medicine

## 2015-11-27 DIAGNOSIS — T84029D Dislocation of unspecified internal joint prosthesis, subsequent encounter: Secondary | ICD-10-CM | POA: Diagnosis not present

## 2015-11-28 ENCOUNTER — Other Ambulatory Visit: Payer: Self-pay | Admitting: *Deleted

## 2015-11-28 MED ORDER — GLIPIZIDE 10 MG PO TABS: 10.0000 mg | ORAL_TABLET | Freq: Every day | ORAL | 1 refills | Status: DC

## 2015-11-28 MED ORDER — GEMFIBROZIL 600 MG PO TABS: 600.0000 mg | ORAL_TABLET | Freq: Two times a day (BID) | ORAL | 1 refills | Status: DC

## 2015-12-03 DIAGNOSIS — R262 Difficulty in walking, not elsewhere classified: Secondary | ICD-10-CM | POA: Diagnosis not present

## 2015-12-03 DIAGNOSIS — M25551 Pain in right hip: Secondary | ICD-10-CM | POA: Diagnosis not present

## 2015-12-03 DIAGNOSIS — Z96641 Presence of right artificial hip joint: Secondary | ICD-10-CM | POA: Diagnosis not present

## 2015-12-03 DIAGNOSIS — M25651 Stiffness of right hip, not elsewhere classified: Secondary | ICD-10-CM | POA: Diagnosis not present

## 2015-12-05 ENCOUNTER — Ambulatory Visit (INDEPENDENT_AMBULATORY_CARE_PROVIDER_SITE_OTHER): Payer: PPO | Admitting: Family Medicine

## 2015-12-05 ENCOUNTER — Encounter: Payer: Self-pay | Admitting: Family Medicine

## 2015-12-05 DIAGNOSIS — E669 Obesity, unspecified: Secondary | ICD-10-CM | POA: Diagnosis not present

## 2015-12-05 DIAGNOSIS — E119 Type 2 diabetes mellitus without complications: Secondary | ICD-10-CM | POA: Diagnosis not present

## 2015-12-05 DIAGNOSIS — E1169 Type 2 diabetes mellitus with other specified complication: Secondary | ICD-10-CM

## 2015-12-05 MED ORDER — INSULIN LISPRO 100 UNIT/ML (KWIKPEN): 10.0000 [IU] | PEN_INJECTOR | Freq: Three times a day (TID) | SUBCUTANEOUS | 11 refills | Status: DC

## 2015-12-05 MED ORDER — INSULIN GLARGINE 100 UNIT/ML ~~LOC~~ SOLN: 65.0000 [IU] | Freq: Two times a day (BID) | SUBCUTANEOUS | 99 refills | Status: DC

## 2015-12-05 NOTE — Patient Instructions (Signed)
Change lantus to 65 units twice a day.  Start humalog with 10 units with each meal.  Ask the pharmacy if other insulins will be cheaper.   Check pre/post sugars and update me in a few days.  We'll go from there.  Plan on recheck A1c in about 1 month before a visit.  Take care.  Glad to see you.

## 2015-12-05 NOTE — Progress Notes (Signed)
Pre visit review using our clinic review tool, if applicable. No additional management support is needed unless otherwise documented below in the visit note. 

## 2015-12-05 NOTE — Progress Notes (Signed)
His double vision is getting better slowly.  He has no sx early in the AM.    He made his trip to [place].  The flight was tough but the trip o/w was okay.  He is working on strengthening and stretching his R quad.    Diabetes:  Using medications without difficulties:see below Hypoglycemic episodes:no Hyperglycemic episodes:yes Blood Sugars averaging: usually still in the 300s.   eye exam within last year: Still on metformin 1000mg  BID and glucotrol once daily.   He ran out of insulin recently, yesterday.  Still working on diet, avoiding carbs.    Meds, vitals, and allergies reviewed.   ROS: Per HPI unless specifically indicated in ROS section   GEN: nad, alert and oriented HEENT: mucous membranes moist NECK: supple w/o LA CV: rrr. PULM: ctab, no inc wob ABD: soft, +bs EXT: no edema SKIN: no acute rash

## 2015-12-06 NOTE — Assessment & Plan Note (Signed)
Diffusely sugars noted. At this point change Lantus from once daily dosing to twice daily dosing. He will change from 130 units once a day down to 65 units twice a day. In the meantime add on mealtime insulin 10 units with meals with the kwik pen. I gave him agreed to fill out for pre-and post meal sugars. He'll update me in a few days. Would likely going to have to increase his Lantus and his mealtime insulin. We did not start him on a sliding scale yet, he can work on this later on as needed. This was discussed with patient and he agrees. At this point still okay for outpatient follow-up. >25 minutes spent in face to face time with patient, >50% spent in counselling or coordination of care.

## 2015-12-09 DIAGNOSIS — R262 Difficulty in walking, not elsewhere classified: Secondary | ICD-10-CM | POA: Diagnosis not present

## 2015-12-09 DIAGNOSIS — M25511 Pain in right shoulder: Secondary | ICD-10-CM | POA: Diagnosis not present

## 2015-12-09 DIAGNOSIS — M25651 Stiffness of right hip, not elsewhere classified: Secondary | ICD-10-CM | POA: Diagnosis not present

## 2015-12-09 DIAGNOSIS — Z96641 Presence of right artificial hip joint: Secondary | ICD-10-CM | POA: Diagnosis not present

## 2015-12-12 DIAGNOSIS — Z96641 Presence of right artificial hip joint: Secondary | ICD-10-CM | POA: Diagnosis not present

## 2015-12-12 DIAGNOSIS — M25551 Pain in right hip: Secondary | ICD-10-CM | POA: Diagnosis not present

## 2015-12-12 DIAGNOSIS — R262 Difficulty in walking, not elsewhere classified: Secondary | ICD-10-CM | POA: Diagnosis not present

## 2015-12-12 DIAGNOSIS — M25651 Stiffness of right hip, not elsewhere classified: Secondary | ICD-10-CM | POA: Diagnosis not present

## 2015-12-13 DIAGNOSIS — H2513 Age-related nuclear cataract, bilateral: Secondary | ICD-10-CM | POA: Diagnosis not present

## 2015-12-13 DIAGNOSIS — H532 Diplopia: Secondary | ICD-10-CM | POA: Diagnosis not present

## 2015-12-13 DIAGNOSIS — H4921 Sixth [abducent] nerve palsy, right eye: Secondary | ICD-10-CM | POA: Diagnosis not present

## 2015-12-16 ENCOUNTER — Ambulatory Visit (AMBULATORY_SURGERY_CENTER): Payer: Self-pay | Admitting: *Deleted

## 2015-12-16 VITALS — Ht 72.0 in | Wt 263.0 lb

## 2015-12-16 DIAGNOSIS — Z86010 Personal history of colonic polyps: Secondary | ICD-10-CM

## 2015-12-16 DIAGNOSIS — Z1211 Encounter for screening for malignant neoplasm of colon: Secondary | ICD-10-CM

## 2015-12-16 MED ORDER — NA SULFATE-K SULFATE-MG SULF 17.5-3.13-1.6 GM/177ML PO SOLN: ORAL | 0 refills | Status: DC

## 2015-12-16 NOTE — Progress Notes (Signed)
Patient denies any allergies to eggs or soy. Patient has nausea and vomiting problems with anesthesia. Patient denies any oxygen use at home and does not take any diet/weight loss medications.

## 2015-12-17 DIAGNOSIS — M25551 Pain in right hip: Secondary | ICD-10-CM | POA: Diagnosis not present

## 2015-12-17 DIAGNOSIS — M25651 Stiffness of right hip, not elsewhere classified: Secondary | ICD-10-CM | POA: Diagnosis not present

## 2015-12-17 DIAGNOSIS — Z96641 Presence of right artificial hip joint: Secondary | ICD-10-CM | POA: Diagnosis not present

## 2015-12-17 DIAGNOSIS — R262 Difficulty in walking, not elsewhere classified: Secondary | ICD-10-CM | POA: Diagnosis not present

## 2015-12-18 ENCOUNTER — Other Ambulatory Visit: Payer: Self-pay | Admitting: *Deleted

## 2015-12-18 MED ORDER — LOSARTAN POTASSIUM 100 MG PO TABS: 100.0000 mg | ORAL_TABLET | Freq: Every day | ORAL | 1 refills | Status: DC

## 2015-12-19 ENCOUNTER — Other Ambulatory Visit: Payer: Self-pay

## 2015-12-19 MED ORDER — GLUCOSE BLOOD VI STRP: ORAL_STRIP | 5 refills | Status: DC

## 2015-12-19 NOTE — Telephone Encounter (Signed)
Pt is taking BS before and after each meal and request refill one touch ultra test strips to [facility]. Advised pt done. Pt will bring BS log to [facility] for Dr Josefine Class review.

## 2015-12-20 ENCOUNTER — Other Ambulatory Visit: Payer: Self-pay | Admitting: *Deleted

## 2015-12-24 ENCOUNTER — Encounter: Payer: Self-pay | Admitting: Gastroenterology

## 2015-12-30 ENCOUNTER — Ambulatory Visit (AMBULATORY_SURGERY_CENTER): Payer: PPO | Admitting: Gastroenterology

## 2015-12-30 ENCOUNTER — Encounter: Payer: Self-pay | Admitting: Gastroenterology

## 2015-12-30 VITALS — BP 115/76 | HR 72 | Temp 98.0°F | Resp 18 | Ht 72.0 in | Wt 263.0 lb

## 2015-12-30 DIAGNOSIS — E119 Type 2 diabetes mellitus without complications: Secondary | ICD-10-CM | POA: Diagnosis not present

## 2015-12-30 DIAGNOSIS — D123 Benign neoplasm of transverse colon: Secondary | ICD-10-CM

## 2015-12-30 DIAGNOSIS — D122 Benign neoplasm of ascending colon: Secondary | ICD-10-CM | POA: Diagnosis not present

## 2015-12-30 DIAGNOSIS — Z86010 Personal history of colonic polyps: Secondary | ICD-10-CM

## 2015-12-30 DIAGNOSIS — Z1211 Encounter for screening for malignant neoplasm of colon: Secondary | ICD-10-CM | POA: Diagnosis not present

## 2015-12-30 DIAGNOSIS — I10 Essential (primary) hypertension: Secondary | ICD-10-CM | POA: Diagnosis not present

## 2015-12-30 LAB — GLUCOSE, CAPILLARY
Glucose-Capillary: 138 mg/dL — ABNORMAL HIGH (ref 65–99)
Glucose-Capillary: 122 mg/dL — ABNORMAL HIGH (ref 65–99)

## 2015-12-30 MED ORDER — SODIUM CHLORIDE 0.9 % IV SOLN: 500.0000 mL | INTRAVENOUS | Status: DC

## 2015-12-30 NOTE — Patient Instructions (Signed)
Colon polyps removed today, result letter in your mail in 2-3 weeks. Handout given on polyps,hemorrhoids. DO NOT take Ibuprofen, advil, aleve, naproxen, or any other non-steriodal anti-inflammatory medications for 2 weeks. Resume current medications. May take Citrucel daily fiber supplement as directed.  Call us if needed. Thank you!!  YOU HAD AN ENDOSCOPIC PROCEDURE TODAY AT [facility]:   Refer to the procedure report that was given to you for any specific questions about what was found during the examination.  If the procedure report does not answer your questions, please call your gastroenterologist to clarify.  If you requested that your care partner not be given the details of your procedure findings, then the procedure report has been included in a sealed envelope for you to review at your convenience later.  YOU SHOULD EXPECT: Some feelings of bloating in the abdomen. Passage of more gas than usual.  Walking can help get rid of the air that was put into your GI tract during the procedure and reduce the bloating. If you had a lower endoscopy (such as a colonoscopy or flexible sigmoidoscopy) you may notice spotting of blood in your stool or on the toilet paper. If you underwent a bowel prep for your procedure, you may not have a normal bowel movement for a few days.  Please Note:  You might notice some irritation and congestion in your nose or some drainage.  This is from the oxygen used during your procedure.  There is no need for concern and it should clear up in a day or so.  SYMPTOMS TO REPORT IMMEDIATELY:   Following lower endoscopy (colonoscopy or flexible sigmoidoscopy):  Excessive amounts of blood in the stool  Significant tenderness or worsening of abdominal pains  Swelling of the abdomen that is new, acute  Fever of 100F or higher   For urgent or emergent issues, a gastroenterologist can be reached at any hour by calling [phone].   DIET:  We do  recommend a small meal at first, but then you may proceed to your regular diet.  Drink plenty of fluids but you should avoid alcoholic beverages for 24 hours.  ACTIVITY:  You should plan to take it easy for the rest of today and you should NOT DRIVE or use heavy machinery until tomorrow (because of the sedation medicines used during the test).    FOLLOW UP: Our staff will call the number listed on your records the next business day following your procedure to check on you and address any questions or concerns that you may have regarding the information given to you following your procedure. If we do not reach you, we will leave a message.  However, if you are feeling well and you are not experiencing any problems, there is no need to return our call.  We will assume that you have returned to your regular daily activities without incident.  If any biopsies were taken you will be contacted by phone or by letter within the next 1-3 weeks.  Please call us at [phone] if you have not heard about the biopsies in 3 weeks.    SIGNATURES/CONFIDENTIALITY: You and/or your care partner have signed paperwork which will be entered into your electronic medical record.  These signatures attest to the fact that that the information above on your After Visit Summary has been reviewed and is understood.  Full responsibility of the confidentiality of this discharge information lies with you and/or your care-partner.

## 2015-12-30 NOTE — Progress Notes (Signed)
Called to room to assist during endoscopic procedure.  Patient ID and intended procedure confirmed with present staff. Received instructions for my participation in the procedure from the performing physician.  

## 2015-12-30 NOTE — Progress Notes (Signed)
A/ox3, pleased with MAC, report to RN 

## 2015-12-30 NOTE — Op Note (Signed)
[facility] Patient Name: Quantavius Reisinger Procedure Date: 12/30/2015 8:28 AM MRN: [id number] Endoscopist: Remo Lipps P. Havery Moros , MD Age: 75 Referring MD:  Date of Birth: 01/20/1941 Gender: Male Account #: [id number] Procedure:                Colonoscopy Indications:              Screening for malignant neoplasm in the colon Medicines:                Monitored Anesthesia Care Procedure:                Pre-Anesthesia Assessment:                           - Prior to the procedure, a History and Physical                            was performed, and patient medications and                            allergies were reviewed. The patient's tolerance of                            previous anesthesia was also reviewed. The risks                            and benefits of the procedure and the sedation                            options and risks were discussed with the patient.                            All questions were answered, and informed consent                            was obtained. Prior Anticoagulants: The patient has                            taken aspirin, last dose was 1 day prior to                            procedure. ASA Grade Assessment: II - A patient                            with mild systemic disease. After reviewing the                            risks and benefits, the patient was deemed in                            satisfactory condition to undergo the procedure.                           After obtaining informed consent, the colonoscope  was passed under direct vision. Throughout the                            procedure, the patient's blood pressure, pulse, and                            oxygen saturations were monitored continuously. The                            Model CF-HQ190L [id number]) scope was introduced                            through the anus and advanced to the the cecum,                            identified by  appendiceal orifice and ileocecal                            valve. The colonoscopy was performed without                            difficulty. The patient tolerated the procedure                            well. The quality of the bowel preparation was                            good. The ileocecal valve, appendiceal orifice, and                            rectum were photographed. Scope In: 8:34:23 AM Scope Out: 8:52:30 AM Scope Withdrawal Time: 0 hours 15 minutes 2 seconds  Total Procedure Duration: 0 hours 18 minutes 7 seconds  Findings:                 The perianal and digital rectal examinations were                            normal.                           A 3 mm polyp was found in the ascending colon. The                            polyp was sessile. The polyp was removed with a                            cold biopsy forceps. Resection and retrieval were                            complete.                           A 3 mm polyp was found in the transverse colon. The  polyp was sessile. The polyp was removed with a                            cold biopsy forceps. Resection and retrieval were                            complete.                           A 4 mm polyp was found in the transverse colon. The                            polyp was sessile. The polyp was removed with a                            cold snare. Resection and retrieval were complete.                           Scattered medium-mouthed diverticula were found in                            the left colon.                           Non-bleeding internal hemorrhoids were found during                            retroflexion.                           The exam was otherwise without abnormality. Complications:            No immediate complications. Estimated blood loss:                            Minimal. Estimated Blood Loss:     Estimated blood loss was minimal. Impression:                - One 3 mm polyp in the ascending colon, removed                            with a cold biopsy forceps. Resected and retrieved.                           - One 3 mm polyp in the transverse colon, removed                            with a cold biopsy forceps. Resected and retrieved.                           - One 4 mm polyp in the ascending colon, removed                            with a cold snare. Resected and retrieved.                           -  Diverticulosis in the left colon.                           - Non-bleeding internal hemorrhoids.                           - The examination was otherwise normal. Recommendation:           - Patient has a contact number available for                            emergencies. The signs and symptoms of potential                            delayed complications were discussed with the                            patient. Return to normal activities tomorrow.                            Written discharge instructions were provided to the                            patient.                           - Resume previous diet.                           - Continue present medications.                           - No ibuprofen, naproxen, or other non-steroidal                            anti-inflammatory drugs for 2 weeks after polyp                            removal.                           - Await pathology results.                           - Repeat colonoscopy is recommended for                            surveillance. The colonoscopy date will be                            determined after pathology results from today's                            exam become available for review. Remo Lipps P. Armbruster, MD 12/30/2015 8:57:10 AM This report has been signed electronically.

## 2015-12-31 ENCOUNTER — Telehealth: Payer: Self-pay | Admitting: *Deleted

## 2015-12-31 NOTE — Telephone Encounter (Signed)
  Follow up Call-  Call back number 12/30/2015  Post procedure Call Back phone  # [phone]  Permission to leave phone message Yes  Some recent data might be hidden     Patient questions:  Do you have a fever, pain , or abdominal swelling? No. Pain Score  0 *  Have you tolerated food without any problems? Yes.    Have you been able to return to your normal activities? Yes.    Do you have any questions about your discharge instructions: Diet   No. Medications  No. Follow up visit  No.  Do you have questions or concerns about your Care? No.  Actions: * If pain score is 4 or above: No action needed, pain <4.

## 2016-01-08 ENCOUNTER — Encounter: Payer: Self-pay | Admitting: Gastroenterology

## 2016-01-08 DIAGNOSIS — T84029D Dislocation of unspecified internal joint prosthesis, subsequent encounter: Secondary | ICD-10-CM | POA: Diagnosis not present

## 2016-02-17 ENCOUNTER — Ambulatory Visit (INDEPENDENT_AMBULATORY_CARE_PROVIDER_SITE_OTHER): Payer: PPO

## 2016-02-17 DIAGNOSIS — Z23 Encounter for immunization: Secondary | ICD-10-CM | POA: Diagnosis not present

## 2016-02-18 ENCOUNTER — Ambulatory Visit (INDEPENDENT_AMBULATORY_CARE_PROVIDER_SITE_OTHER): Payer: PPO | Admitting: Family Medicine

## 2016-02-18 ENCOUNTER — Encounter: Payer: Self-pay | Admitting: Family Medicine

## 2016-02-18 VITALS — BP 156/76 | HR 88 | Temp 98.4°F | Wt 269.2 lb

## 2016-02-18 DIAGNOSIS — M1611 Unilateral primary osteoarthritis, right hip: Secondary | ICD-10-CM | POA: Diagnosis not present

## 2016-02-18 DIAGNOSIS — E119 Type 2 diabetes mellitus without complications: Secondary | ICD-10-CM

## 2016-02-18 DIAGNOSIS — E669 Obesity, unspecified: Secondary | ICD-10-CM

## 2016-02-18 DIAGNOSIS — E1169 Type 2 diabetes mellitus with other specified complication: Secondary | ICD-10-CM | POA: Diagnosis not present

## 2016-02-18 LAB — HEMOGLOBIN A1C: Hgb A1c MFr Bld: 7.8 % — ABNORMAL HIGH (ref 4.6–6.5)

## 2016-02-18 MED ORDER — MELOXICAM 15 MG PO TABS: 15.0000 mg | ORAL_TABLET | Freq: Every day | ORAL | Status: DC | PRN

## 2016-02-18 MED ORDER — INSULIN GLARGINE 100 UNIT/ML ~~LOC~~ SOLN: 50.0000 [IU] | Freq: Two times a day (BID) | SUBCUTANEOUS | 99 refills | Status: DC

## 2016-02-18 NOTE — Progress Notes (Signed)
Pre visit review using our clinic review tool, if applicable. No additional management support is needed unless otherwise documented below in the visit note. 

## 2016-02-18 NOTE — Patient Instructions (Signed)
If the exercises don't help with leg strength soon, then check back with ortho.  Go to the lab on the way out.  We'll contact you with your lab report. Take care.  Glad to see you.  Recheck in about 3 months.

## 2016-02-18 NOTE — Progress Notes (Signed)
Diabetes:  Using medications without difficulties: yes Hypoglycemic episodes:no Hyperglycemic episodes: prev up to 300, but clearly improved in the meantime with meal time insulin and med adjustments.   Foot sx: some occ tingling in the feet. No sx currently.  Better sx as sugar improved.   Blood Sugars averaging: usually ~150-200 in the AM, lower later in the day.  Has likely dawn effect.  Sugar logs reviewed.  Usually sugar similar before and 2 hours after meals with 10 units of mealtime insulin.   eye exam within last year: lantus 50 units BID.  humalog 10 units TID Due for A1c.    Still with R hip pain, pain leaning over.  He saw ortho in October.  He has been trying to exercise at home but still with R quad cramping.  Surgery done prev by Dr. Percell Miller.  Unclear how much of his troubles are due to gait compensation after surgery.  meloxicam helps some.   PMH and SH reviewed  Meds, vitals, and allergies reviewed.   ROS: Per HPI unless specifically indicated in ROS section   GEN: nad, alert and oriented HEENT: mucous membranes moist NECK: supple w/o LA CV: rrr. PULM: ctab, no inc wob ABD: soft, +bs EXT: no edema Dec R quad mass.    Diabetic foot exam: Normal inspection No skin breakdown No calluses  Normal DP pulses Normal sensation to light touch but decreased sensation with monofilament Nails normal

## 2016-02-19 NOTE — Assessment & Plan Note (Signed)
Sugar log reviewed with patient I detail. He has had overall trend  Of improved sugar control. His readings tend to e higher in the morning but then better as the day goes on, with his premeal sugar relatively close to his sugar reading 2 hours after the meal. No adverse effect  On current medications.   At this point would continue as is for now. See note on A1c. >25 minutes spent in face to face time with patient, >50% spent in counselling or coordination of care.

## 2016-02-19 NOTE — Assessment & Plan Note (Signed)
I think that the patient's relative quad weakness if affecting his gait.  If not improved with continued home exercise routine then I want him to f/u with ortho.  He agrees.

## 2016-03-09 ENCOUNTER — Other Ambulatory Visit: Payer: Self-pay | Admitting: *Deleted

## 2016-03-09 MED ORDER — ATORVASTATIN CALCIUM 10 MG PO TABS: 10.0000 mg | ORAL_TABLET | Freq: Every day | ORAL | 3 refills | Status: DC

## 2016-03-12 ENCOUNTER — Encounter: Payer: Self-pay | Admitting: Family Medicine

## 2016-03-12 ENCOUNTER — Ambulatory Visit (INDEPENDENT_AMBULATORY_CARE_PROVIDER_SITE_OTHER): Payer: PPO | Admitting: Family Medicine

## 2016-03-12 DIAGNOSIS — J3089 Other allergic rhinitis: Secondary | ICD-10-CM | POA: Diagnosis not present

## 2016-03-12 DIAGNOSIS — J309 Allergic rhinitis, unspecified: Secondary | ICD-10-CM | POA: Insufficient documentation

## 2016-03-12 DIAGNOSIS — B9789 Other viral agents as the cause of diseases classified elsewhere: Secondary | ICD-10-CM

## 2016-03-12 DIAGNOSIS — J069 Acute upper respiratory infection, unspecified: Secondary | ICD-10-CM | POA: Insufficient documentation

## 2016-03-12 MED ORDER — GUAIFENESIN-CODEINE 100-10 MG/5ML PO SYRP: 5.0000 mL | ORAL_SOLUTION | Freq: Every evening | ORAL | 0 refills | Status: DC | PRN

## 2016-03-12 NOTE — Progress Notes (Signed)
   Subjective:    Patient ID: [name], male    DOB: May 11, 1940, 75 y.o.   MRN: [id number]  Cough  This is a new problem. The current episode started in the past 7 days. The problem has been unchanged. The cough is productive of sputum (clear). Associated symptoms include nasal congestion and postnasal drip. Pertinent negatives include no ear congestion, ear pain, fever, headaches, rash, shortness of breath or wheezing. Associated symptoms comments: No sinus pain  fatigued  sneeze, runny nose.. clear mucus.. Risk factors: nonsmoker. Treatments tried:  Afrin, mucinex DM, claritin. The treatment provided mild relief. His past medical history is significant for environmental allergies. There is no history of asthma, COPD or emphysema.      Review of Systems  Constitutional: Negative for fever.  HENT: Positive for postnasal drip. Negative for ear pain.   Respiratory: Positive for cough. Negative for shortness of breath and wheezing.   Skin: Negative for rash.  Allergic/Immunologic: Positive for environmental allergies.  Neurological: Negative for headaches.       Objective:   Physical Exam  Constitutional: Vital signs are normal. He appears well-developed and well-nourished.  Non-toxic appearance. He does not appear ill. No distress.  HENT:  Head: Normocephalic and atraumatic.  Right Ear: Hearing, tympanic membrane, external ear and ear canal normal. No tenderness. No foreign bodies. Tympanic membrane is not retracted and not bulging.  Left Ear: Hearing, tympanic membrane, external ear and ear canal normal. No tenderness. No foreign bodies. Tympanic membrane is not retracted and not bulging.  Nose: Nose normal. No mucosal edema or rhinorrhea. Right sinus exhibits no maxillary sinus tenderness and no frontal sinus tenderness. Left sinus exhibits no maxillary sinus tenderness and no frontal sinus tenderness.  Mouth/Throat: Uvula is midline, oropharynx is clear and moist and mucous  membranes are normal. Normal dentition. No dental caries. No oropharyngeal exudate or tonsillar abscesses.  Eyes: Conjunctivae, EOM and lids are normal. Pupils are equal, round, and reactive to light. Lids are everted and swept, no foreign bodies found.  Neck: Trachea normal, normal range of motion and phonation normal. Neck supple. Carotid bruit is not present. No thyroid mass and no thyromegaly present.  Cardiovascular: Normal rate, regular rhythm, S1 normal, S2 normal, normal heart sounds, intact distal pulses and normal pulses.  Exam reveals no gallop.   No murmur heard. Pulmonary/Chest: Effort normal and breath sounds normal. No respiratory distress. He has no wheezes. He has no rhonchi. He has no rales.  Abdominal: Soft. Normal appearance and bowel sounds are normal. There is no hepatosplenomegaly. There is no tenderness. There is no rebound, no guarding and no CVA tenderness. No hernia.  Neurological: He is alert. He has normal reflexes.  Skin: Skin is warm, dry and intact. No rash noted.  Psychiatric: He has a normal mood and affect. His speech is normal and behavior is normal. Judgment normal.          Assessment & Plan:

## 2016-03-12 NOTE — Assessment & Plan Note (Signed)
Symtpomatic care.

## 2016-03-12 NOTE — Assessment & Plan Note (Signed)
Treat with nasal steroid, nasal saline and change to zyrtec.

## 2016-03-12 NOTE — Patient Instructions (Signed)
Stop nasal spray.  Start flonase 2 sprays per nostril daily.  Start nasal saline spray  2-3 times daily.  Mucinex DM during the day.  Cough suppressant at night as needed.  Change to  Zyrtec (certirizine) or Xyzal bedtime.  Call if not turing the corner in 4-5 days.. Call.  Call sooner if new fever > 101.4 F, one sided face pain.  Go to ER  If severe shortness of breath.

## 2016-03-12 NOTE — Progress Notes (Signed)
Pre visit review using our clinic review tool, if applicable. No additional management support is needed unless otherwise documented below in the visit note. 

## 2016-03-13 ENCOUNTER — Emergency Department (HOSPITAL_COMMUNITY): Payer: PPO

## 2016-03-13 ENCOUNTER — Emergency Department (HOSPITAL_COMMUNITY)
Admission: EM | Admit: 2016-03-13 | Discharge: 2016-03-13 | Disposition: A | Discharge status: Home / Self Care | Payer: PPO | Attending: Emergency Medicine | Admitting: Emergency Medicine

## 2016-03-13 ENCOUNTER — Encounter (HOSPITAL_COMMUNITY): Payer: Self-pay | Admitting: Emergency Medicine

## 2016-03-13 ENCOUNTER — Telehealth: Payer: Self-pay | Admitting: Family Medicine

## 2016-03-13 DIAGNOSIS — R404 Transient alteration of awareness: Secondary | ICD-10-CM | POA: Diagnosis not present

## 2016-03-13 DIAGNOSIS — Z794 Long term (current) use of insulin: Secondary | ICD-10-CM | POA: Diagnosis not present

## 2016-03-13 DIAGNOSIS — Z79899 Other long term (current) drug therapy: Secondary | ICD-10-CM | POA: Insufficient documentation

## 2016-03-13 DIAGNOSIS — J40 Bronchitis, not specified as acute or chronic: Secondary | ICD-10-CM | POA: Insufficient documentation

## 2016-03-13 DIAGNOSIS — Z96641 Presence of right artificial hip joint: Secondary | ICD-10-CM | POA: Diagnosis not present

## 2016-03-13 DIAGNOSIS — Z96653 Presence of artificial knee joint, bilateral: Secondary | ICD-10-CM | POA: Diagnosis not present

## 2016-03-13 DIAGNOSIS — J069 Acute upper respiratory infection, unspecified: Secondary | ICD-10-CM | POA: Diagnosis not present

## 2016-03-13 DIAGNOSIS — S299XXA Unspecified injury of thorax, initial encounter: Secondary | ICD-10-CM | POA: Diagnosis not present

## 2016-03-13 DIAGNOSIS — I10 Essential (primary) hypertension: Secondary | ICD-10-CM | POA: Insufficient documentation

## 2016-03-13 DIAGNOSIS — Z7982 Long term (current) use of aspirin: Secondary | ICD-10-CM | POA: Diagnosis not present

## 2016-03-13 DIAGNOSIS — E119 Type 2 diabetes mellitus without complications: Secondary | ICD-10-CM | POA: Diagnosis not present

## 2016-03-13 DIAGNOSIS — R531 Weakness: Secondary | ICD-10-CM | POA: Diagnosis not present

## 2016-03-13 DIAGNOSIS — R05 Cough: Secondary | ICD-10-CM | POA: Diagnosis present

## 2016-03-13 LAB — CBC WITH DIFFERENTIAL/PLATELET
Basophils Absolute: 0 10*3/uL (ref 0.0–0.1)
Basophils Relative: 0 %
Eosinophils Absolute: 0.1 10*3/uL (ref 0.0–0.7)
Eosinophils Relative: 1 %
HCT: 38.6 % — ABNORMAL LOW (ref 39.0–52.0)
Hemoglobin: 13.7 g/dL (ref 13.0–17.0)
Lymphocytes Relative: 24 %
Lymphs Abs: 1.9 10*3/uL (ref 0.7–4.0)
MCH: 31.6 pg (ref 26.0–34.0)
MCHC: 35.5 g/dL (ref 30.0–36.0)
MCV: 89.1 fL (ref 78.0–100.0)
Monocytes Absolute: 1.2 10*3/uL — ABNORMAL HIGH (ref 0.1–1.0)
Monocytes Relative: 16 %
Neutro Abs: 4.6 10*3/uL (ref 1.7–7.7)
Neutrophils Relative %: 59 %
Platelets: 205 10*3/uL (ref 150–400)
RBC: 4.33 MIL/uL (ref 4.22–5.81)
RDW: 13.8 % (ref 11.5–15.5)
WBC: 7.8 10*3/uL (ref 4.0–10.5)

## 2016-03-13 LAB — URINALYSIS, ROUTINE W REFLEX MICROSCOPIC
Bacteria, UA: NONE SEEN
Bilirubin Urine: NEGATIVE
Glucose, UA: >=500 mg/dL — AB
Ketones, ur: NEGATIVE mg/dL
Leukocytes, UA: NEGATIVE
Nitrite: NEGATIVE
Protein, ur: NEGATIVE mg/dL
Specific Gravity, Urine: 1.015 (ref 1.005–1.030)
pH: 5 (ref 5.0–8.0)

## 2016-03-13 LAB — COMPREHENSIVE METABOLIC PANEL
ALT: 31 U/L (ref 17–63)
AST: 32 U/L (ref 15–41)
Albumin: 3.6 g/dL (ref 3.5–5.0)
Alkaline Phosphatase: 39 U/L (ref 38–126)
Anion gap: 10 (ref 5–15)
BUN: 24 mg/dL — ABNORMAL HIGH (ref 6–20)
CO2: 25 mmol/L (ref 22–32)
Calcium: 9.1 mg/dL (ref 8.9–10.3)
Chloride: 100 mmol/L — ABNORMAL LOW (ref 101–111)
Creatinine, Ser: 1.35 mg/dL — ABNORMAL HIGH (ref 0.61–1.24)
GFR calc Af Amer: 58 mL/min — ABNORMAL LOW (ref >60)
GFR calc non Af Amer: 50 mL/min — ABNORMAL LOW (ref >60)
Glucose, Bld: 211 mg/dL — ABNORMAL HIGH (ref 65–99)
Potassium: 3.5 mmol/L (ref 3.5–5.1)
Sodium: 135 mmol/L (ref 135–145)
Total Bilirubin: 0.8 mg/dL (ref 0.3–1.2)
Total Protein: 7.4 g/dL (ref 6.5–8.1)

## 2016-03-13 MED ORDER — BENZONATATE 100 MG PO CAPS: 100.0000 mg | ORAL_CAPSULE | Freq: Three times a day (TID) | ORAL | 0 refills | Status: DC

## 2016-03-13 MED ORDER — ACETAMINOPHEN 325 MG PO TABS
650.0000 mg | ORAL_TABLET | Freq: Once | ORAL | Status: AC
  Administered 2016-03-13: 650 mg via ORAL
  Filled 2016-03-13: qty 2

## 2016-03-13 MED ORDER — SODIUM CHLORIDE 0.9 % IV BOLUS (SEPSIS)
1000.0000 mL | Freq: Once | INTRAVENOUS | Status: AC
  Administered 2016-03-13: 1000 mL via INTRAVENOUS

## 2016-03-13 MED ORDER — BENZONATATE 100 MG PO CAPS
100.0000 mg | ORAL_CAPSULE | Freq: Once | ORAL | Status: AC
  Administered 2016-03-13: 100 mg via ORAL
  Filled 2016-03-13: qty 1

## 2016-03-13 MED ORDER — AZITHROMYCIN 250 MG PO TABS: ORAL_TABLET | ORAL | 0 refills | Status: DC

## 2016-03-13 MED ORDER — ALBUTEROL SULFATE HFA 108 (90 BASE) MCG/ACT IN AERS
2.0000 | INHALATION_SPRAY | Freq: Once | RESPIRATORY_TRACT | Status: AC
  Administered 2016-03-13: 2 via RESPIRATORY_TRACT
  Filled 2016-03-13: qty 6.7

## 2016-03-13 MED ORDER — SODIUM CHLORIDE 0.9 % IJ SOLN
INTRAMUSCULAR | Status: AC
  Filled 2016-03-13: qty 50

## 2016-03-13 MED ORDER — IOPAMIDOL (ISOVUE-370) INJECTION 76%
INTRAVENOUS | Status: AC
  Administered 2016-03-13: 80 mL via INTRAVENOUS
  Filled 2016-03-13: qty 100

## 2016-03-13 NOTE — ED Triage Notes (Signed)
Per EMS pt recently diagnosed with URI worsening worsening generalized weakness.

## 2016-03-13 NOTE — Telephone Encounter (Signed)
Patient Name: [name] DOB: [birth year] Initial Comment Caller states husband took double dose of Rx cough syrup with codeine, having weakness that led him to fall twice last night. Nurse Assessment Nurse: [name], RN, April Date/Time Eilene Ghazi Time): 03/13/2016 8:23:18 AM Confirm and document reason for call. If symptomatic, describe symptoms. ---Caller states her husband was seen by Dr. Warren Lacy due to his doctor being booked. He was prescribed cough medicine with codeine. He was coughing and took 7.5 ml during the day. He then took the medicine again last night before bed. He almost fell out of bed and she had to call 911 to help him get back into bed twice once at 8 and once at 11 last night. He has also lost control of his bladder. Does the patient have any new or worsening symptoms? ---Yes Will a triage be completed? ---Yes Related visit to physician within the last 2 weeks? ---Yes Does the PT have any chronic conditions? (i.e. diabetes, asthma, etc.) ---Yes List chronic conditions. ---Diabetes, hypertension Is this a behavioral health or substance abuse call? ---No Guidelines Guideline Title Affirmed Question Affirmed Notes Urinary Symptoms Shock suspected (e.g., cold/pale/clammy skin, too weak to stand, low BP, rapid pulse) Final Disposition User Call EMS 911 Now [place], RN, April Disagree/Comply: Comply

## 2016-03-13 NOTE — Telephone Encounter (Signed)
Per chart review tab pt went to [facility] today.

## 2016-03-13 NOTE — ED Notes (Signed)
Bed: [id number] Expected date:  Expected time:  Means of arrival:  Comments: EMS-weakness

## 2016-03-13 NOTE — ED Notes (Signed)
Delay in lab draw due to patient being in xray

## 2016-03-13 NOTE — ED Notes (Signed)
Pt request to eat. Pt eating meal per provider okay and will attempt to ambulate pt post meal.

## 2016-03-13 NOTE — ED Notes (Signed)
Dressing loose. New dressing applied.

## 2016-03-13 NOTE — ED Notes (Signed)
Pt ambulates with steady gait.

## 2016-03-13 NOTE — ED Provider Notes (Signed)
[facility] DEPT Provider Note   CSN: [id number] Arrival date & time: 03/13/16  1004     History   Chief Complaint Chief Complaint  Patient presents with  . Generalized Weakness    HPI [name] is a 75 y.o. male.   Weakness  Primary symptoms include no dizziness. This is a new problem. The current episode started yesterday. The problem has been gradually improving. There was no focality noted. There has been no fever. The fever has been present for less than 1 day. Pertinent negatives include no shortness of breath and no vomiting. There were no medications administered prior to arrival. Associated medical issues do not include trauma, a bleeding disorder, seizures or CVA.  Cough  Pertinent negatives include no shortness of breath.    Past Medical History:  Diagnosis Date  . Arthritis    arthirtis all over  . Concussion    multiple- college football player  . Diabetes mellitus type 2 in obese ([place])   . Frequent urination at night   . GERD (gastroesophageal reflux disease)    occ indigestion  . Glaucoma    both eyes  . Hyperlipemia   . Hypertension   . Kidney stones   . PONV (postoperative nausea and vomiting)   . PONV (postoperative nausea and vomiting)   . Primary localized osteoarthritis of right hip   . Primary osteoarthritis of right knee   . Restless leg syndrome   . Right knee DJD   . Spinal stenosis of lumbar region with radiculopathy    s/p L4/L5 transforaminal blocks    Patient Active Problem List   Diagnosis Date Noted  . Viral URI with cough 03/12/2016  . Allergic rhinitis 03/12/2016  . 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 Erectile dysfunction 06/11/2015  . Rhinorrhea 03/13/2015  . 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 Dislocation, hip ([place]) 11/09/2014  . Primary osteoarthritis of right hip 11/07/2014  . DJD (degenerative joint disease) 11/06/2014  . DJD (degenerative joint disease) of knee 01/08/2014  . Primary osteoarthritis of right  knee   . Primary localized osteoarthritis of right hip   . Spinal stenosis of lumbar region with radiculopathy   . Diabetes mellitus type 2 in obese ([place])   . PONV (postoperative nausea and vomiting)   . GERD (gastroesophageal reflux disease)   . Frequent urination at night   . Lumbar stenosis 04/11/2013  . Kidney stones   . Arthritis   . Neuromuscular disorder ([place])   . Right knee DJD   . Chronic calculus cholecystitis 12/31/2011    Past Surgical History:  Procedure Laterality Date  . ABDOMINAL HERNIA REPAIR    . ANTERIOR HIP REVISION Right 01/08/2015   Procedure: RIGHT ANTERIOR HIP REVISION;  Surgeon: Renette Butters, MD;  Location: [facility];  Service: Orthopedics;  Laterality: Right;  . BACK SURGERY    . COLONOSCOPY    . ELBOW ARTHROSCOPY  1990   right  . ELBOW SURGERY     right  . HERNIA REPAIR  1980   inguinal  . HIP CLOSED REDUCTION Right 11/09/2014   Procedure: CLOSED REDUCTION HIP, s/p total hip 8/9;  Surgeon: Ninetta Lights, MD;  Location: [facility];  Service: Orthopedics;  Laterality: Right;  . JOINT REPLACEMENT  2008   knee  . LITHOTRIPSY    . [place] SURGERY     2015  . REVISION TOTAL HIP ARTHROPLASTY Right 01/08/2015  . SHOULDER ARTHROSCOPY  1980   right  .  SHOULDER SURGERY     right  . STONE EXTRACTION WITH BASKET  2010  . TOTAL HIP ARTHROPLASTY Right 11/06/2014   Procedure: RIGHT TOTAL HIP ARTHROPLASTY ANTERIOR APPROACH;  Surgeon: Renette Butters, MD;  Location: [facility];  Service: Orthopedics;  Laterality: Right;  . TOTAL KNEE ARTHROPLASTY Left 07/2006   left knee  . TOTAL KNEE ARTHROPLASTY Right 01/08/2014   Procedure: RIGHT TOTAL KNEE ARTHROPLASTY;  Surgeon: Lorn Junes, MD;  Location: [facility];  Service: Orthopedics;  Laterality: Right;       Home Medications    Prior to Admission medications   Medication Sig Start Date End Date Taking? Authorizing Provider  allopurinol (ZYLOPRIM) 300 MG tablet Take 0.5 tablets (150 mg total) by mouth every  morning. Take half a tablet (150mg ) by mouth daily 08/06/15  Yes Tonia Ghent, MD  amLODipine (NORVASC) 10 MG tablet Take 1 tablet (10 mg total) by mouth daily. 05/09/15  Yes Tonia Ghent, MD  aspirin 81 MG tablet Take 81 mg by mouth daily.   Yes Historical Provider, MD  atorvastatin (LIPITOR) 10 MG tablet Take 1 tablet (10 mg total) by mouth at bedtime. 03/09/16  Yes Tonia Ghent, MD  gemfibrozil (LOPID) 600 MG tablet Take 1 tablet (600 mg total) by mouth 2 (two) times daily. 11/28/15  Yes Tonia Ghent, MD  glipiZIDE (GLUCOTROL) 10 MG tablet Take 1 tablet (10 mg total) by mouth daily before breakfast. 11/28/15  Yes Tonia Ghent, MD  guaiFENesin-codeine [facility]) 100-10 MG/5ML syrup Take 5-10 mLs by mouth at bedtime as needed for cough. 03/12/16  Yes Amy Cletis [place], MD  insulin glargine (LANTUS) 100 UNIT/ML injection Inject 0.5 mLs (50 Units total) into the skin 2 (two) times daily. Disp up to 30 pens at once 02/18/16  Yes Tonia Ghent, MD  insulin lispro (HUMALOG KWIKPEN) 100 UNIT/ML KiwkPen Inject 0.1 mLs (10 Units total) into the skin 3 (three) times daily. With meals 12/05/15  Yes Tonia Ghent, MD  latanoprost (XALATAN) 0.005 % ophthalmic solution Place 1 drop into both eyes at bedtime.   Yes Historical Provider, MD  losartan (COZAAR) 100 MG tablet Take 1 tablet (100 mg total) by mouth daily. 12/18/15  Yes Tonia Ghent, MD  meloxicam (MOBIC) 15 MG tablet Take 1 tablet (15 mg total) by mouth daily as needed for pain. 02/18/16  Yes Tonia Ghent, MD  metFORMIN (GLUCOPHAGE-XR) 500 MG 24 hr tablet Take 2 tablets (1,000 mg total) by mouth 2 (two) times daily. 10/15/15  Yes Tonia Ghent, MD  Multiple Vitamins-Minerals (VITRUM 50+ SENIOR MULTI PO) Take 1 tablet by mouth daily.    Yes Historical Provider, MD  omeprazole (PRILOSEC) 20 MG capsule Take 20 mg by mouth daily. 12/30/15  Yes Historical Provider, MD  polyethylene glycol (MIRALAX / GLYCOLAX) packet Take 17 g by mouth 2 (two)  times daily. 01/10/14  Yes Kirstin Shepperson, PA-C  azithromycin (ZITHROMAX Z-PAK) 250 MG tablet 2 po day one, then 1 daily x 4 days 03/13/16   Merrily Pew, MD  benzonatate (TESSALON) 100 MG capsule Take 1 capsule (100 mg total) by mouth every 8 (eight) hours. 03/13/16   Merrily Pew, MD  benzonatate (TESSALON) 100 MG capsule Take 1 capsule (100 mg total) by mouth every 8 (eight) hours. 03/13/16   Merrily Pew, MD  glucose blood (ONE TOUCH ULTRA TEST) test strip Check blood sugar before and after each meal and as directed. Dx E11.9 12/19/15  Tonia Ghent, MD    Family History Family History  Problem Relation Age of Onset  . Hypertension Mother   . Kidney disease Mother   . Hypertension Father   . Diabetes Mellitus II Father   . Colon polyps Father   . Diabetes Mellitus II Brother   . Diabetes Mellitus II Brother   . Cancer - Prostate Brother   . Colon cancer Neg Hx     Social History Social History  Substance Use Topics  . Smoking status: Never Smoker  . Smokeless tobacco: Never Used  . Alcohol use Yes     Comment: glass of wine every 3-4 months     Allergies   Morphine and related   Review of Systems Review of Systems  Respiratory: Positive for cough. Negative for shortness of breath.   Gastrointestinal: Negative for vomiting.  Neurological: Positive for weakness. Negative for dizziness.  All other systems reviewed and are negative.    Physical Exam Updated Vital Signs BP 168/81 (BP Location: Left Arm)   Pulse 83   Temp 98.3 F (36.8 C) (Oral)   Resp 18   SpO2 92%   Physical Exam  Constitutional: He appears well-developed and well-nourished.  HENT:  Head: Normocephalic and atraumatic.  Eyes: Conjunctivae and EOM are normal.  Neck: Normal range of motion.  Cardiovascular: Normal rate.   No murmur heard. Pulmonary/Chest: Effort normal. No respiratory distress. He has wheezes.  Abdominal: Soft. He exhibits no distension.  Musculoskeletal: Normal range  of motion. He exhibits no edema or deformity.  Neurological: He is alert.  Skin: Skin is warm and dry.  Nursing note and vitals reviewed.    ED Treatments / Results  Labs (all labs ordered are listed, but only abnormal results are displayed) Labs Reviewed  URINALYSIS, ROUTINE W REFLEX MICROSCOPIC - Abnormal; Notable for the following:       Result Value   Glucose, UA >=500 (*)    Hgb urine dipstick MODERATE (*)    Squamous Epithelial / LPF 0-5 (*)    All other components within normal limits  CBC WITH DIFFERENTIAL/PLATELET - Abnormal; Notable for the following:    HCT 38.6 (*)    Monocytes Absolute 1.2 (*)    All other components within normal limits  COMPREHENSIVE METABOLIC PANEL - Abnormal; Notable for the following:    Chloride 100 (*)    Glucose, Bld 211 (*)    BUN 24 (*)    Creatinine, Ser 1.35 (*)    GFR calc non Af Amer 50 (*)    GFR calc Af Amer 58 (*)    All other components within normal limits    EKG  EKG Interpretation  Date/Time:  Friday March 13 2016 11:39:26 EST Ventricular Rate:  101 PR Interval:    QRS Duration: 99 QT Interval:  357 QTC Calculation: 463 R Axis:   -16 Text Interpretation:  Sinus tachycardia Borderline left axis deviation Low voltage, precordial leads Abnormal R-wave progression, late transition Borderline T abnormalities, inferior leads Baseline wander in lead(s) V2 Confirmed by [facility] MD, Christifer Chapdelaine [phone]) on 03/13/2016 12:46:19 PM       Radiology Dg Chest 2 View  Result Date: 03/13/2016 CLINICAL DATA:  Upper respiratory infection, possible pneumonia EXAM: CHEST  2 VIEW COMPARISON:  11/28/2014 FINDINGS: Cardiomediastinal silhouette is stable. No infiltrate or pulmonary edema. Mild perihilar increased bronchial markings without focal consolidation. Degenerative changes thoracic spine. IMPRESSION: No infiltrate or pulmonary edema. Mild perihilar increased bronchial markings without focal consolidation. Electronically  Signed   By: Lahoma Crocker M.D.   On: 03/13/2016 11:25   Ct Angio Chest Pe W And/or Wo Contrast  Result Date: 03/13/2016 CLINICAL DATA:  Weakness. Frequent falling last night. Productive cough. Clinical concern for pulmonary embolism. EXAM: CT ANGIOGRAPHY CHEST WITH CONTRAST TECHNIQUE: Multidetector CT imaging of the chest was performed using the standard protocol during bolus administration of intravenous contrast. Multiplanar CT image reconstructions and MIPs were obtained to evaluate the vascular anatomy. CONTRAST:  80 cc Isovue 370 COMPARISON:  Chest radiographs obtained earlier today. FINDINGS: Cardiovascular: Satisfactory opacification of the pulmonary arteries to the segmental level. No evidence of pulmonary embolism. Normal heart size. No pericardial effusion. Mediastinum/Nodes: No enlarged mediastinal, hilar, or axillary lymph nodes. Thyroid gland, trachea, and esophagus demonstrate no significant findings. Lungs/Pleura: 4 mm nodule in the lingula on image number 52 of series 11. 7 mm nodule in the right lower lobe on image number 46 of series 11, adjacent to accessory fissure. Small nodule adjacent to the minor fissure on coronal image number 42. Two additional small subpleural nodules on the right. Minimal bilateral dependent atelectasis. No pleural fluid. Upper Abdomen: Unremarkable. Musculoskeletal: Thoracic spine degenerative changes. Review of the MIP images confirms the above findings. IMPRESSION: 1. No pulmonary emboli. 2. Small subcentimeter nodules in both lungs, as described above. Non-contrast chest CT at 3-6 months is recommended. If the nodules are stable at time of repeat CT, then future CT at 18-24 months (from today's scan) is considered optional for low-risk patients, but is recommended for high-risk patients. This recommendation follows the consensus statement: Guidelines for Management of Incidental Pulmonary Nodules Detected on CT Images: From the Fleischner Society 2017; Radiology 2017; 284:228-243.  Electronically Signed   By: Claudie Revering M.D.   On: 03/13/2016 15:26    Procedures Procedures (including critical care time)  Medications Ordered in ED Medications  sodium chloride 0.9 % injection (not administered)  albuterol (PROVENTIL HFA;VENTOLIN HFA) 108 (90 Base) MCG/ACT inhaler 2 puff (2 puffs Inhalation Given 03/13/16 1125)  sodium chloride 0.9 % bolus 1,000 mL (0 mLs Intravenous Stopped 03/13/16 1258)  acetaminophen (TYLENOL) tablet 650 mg (650 mg Oral Given 03/13/16 1354)  benzonatate (TESSALON) capsule 100 mg (100 mg Oral Given 03/13/16 1354)  iopamidol (ISOVUE-370) 76 % injection (80 mLs Intravenous Contrast Given 03/13/16 1500)     Initial Impression / Assessment and Plan / ED Course  I have reviewed the triage vital signs and the nursing notes.  Pertinent labs & imaging results that were available during my care of the patient were reviewed by me and considered in my medical decision making (see chart for details).  Clinical Course     Likely bronchitis. z pack given. No PE on CT, did have nodules, will FU w/ pcp for same. Weakness likely 2/2 the tylenol with codeine he had. Doubt sepsis at this point.   Final Clinical Impressions(s) / ED Diagnoses   Final diagnoses:  Bronchitis    New Prescriptions Discharge Medication List as of 03/13/2016  4:26 PM    START taking these medications   Details  !! benzonatate (TESSALON) 100 MG capsule Take 1 capsule (100 mg total) by mouth every 8 (eight) hours., Starting Fri 03/13/2016, Print    !! benzonatate (TESSALON) 100 MG capsule Take 1 capsule (100 mg total) by mouth every 8 (eight) hours., Starting Fri 03/13/2016, Print     !! - Potential duplicate medications found. Please discuss with provider.       Sara Lee,  MD 03/13/16 1705

## 2016-03-15 ENCOUNTER — Telehealth: Payer: Self-pay | Admitting: Family Medicine

## 2016-03-15 NOTE — Telephone Encounter (Signed)
Call pt.  CT with pulmonary nodules see.  Verify smoking hx- I think he is a never smoker.  If ever a smoker, CT in 3 months.  If never smoker, then recheck CT in 6 months.  Let me know so I can put reminder in the EMR.  Thanks.

## 2016-03-16 NOTE — Telephone Encounter (Signed)
Patient advised. Patient has never smoked.  Patient was advised that he have a repeat in 6 months and will be contacted at that time to schedule it.

## 2016-03-16 NOTE — Telephone Encounter (Signed)
Patient returned Lugene's call.  Patient can be reached at [phone].

## 2016-03-16 NOTE — Telephone Encounter (Signed)
Left message on patient's voicemail to return call

## 2016-03-17 NOTE — Telephone Encounter (Signed)
Noted. Thanks.  I put reminder in EMR.

## 2016-04-07 ENCOUNTER — Telehealth: Payer: Self-pay | Admitting: Family Medicine

## 2016-04-07 NOTE — Telephone Encounter (Signed)
Placed in Dr. Duncan's In Box. 

## 2016-04-07 NOTE — Telephone Encounter (Signed)
Patient brought in two forms to be filled out by the PCP.  1) [place] [employer] form and 2) [employer] form.  Both forms are in the PCP's incoming prescription box.

## 2016-04-08 NOTE — Telephone Encounter (Signed)
I'll look at the hard copy and then we'll go from there.  Thanks.

## 2016-04-13 DIAGNOSIS — M7582 Other shoulder lesions, left shoulder: Secondary | ICD-10-CM | POA: Diagnosis not present

## 2016-04-13 DIAGNOSIS — M7581 Other shoulder lesions, right shoulder: Secondary | ICD-10-CM | POA: Diagnosis not present

## 2016-04-13 NOTE — Telephone Encounter (Signed)
I've been thinking about his forms and his situation.  I need him to come and bring a sugar log to OV so we can discuss.  I think that would be best.  Thanks.

## 2016-04-14 NOTE — Telephone Encounter (Signed)
Patient says he doesn't have up to date blood sugar log because his wife has been in the hospital and he has not been in a situation where he could eat properly or take his blood sugars.  He will come in for OV and bring what he has so that he can get his forms filled out.

## 2016-04-14 NOTE — Telephone Encounter (Signed)
Thanks. Noted.

## 2016-04-16 ENCOUNTER — Ambulatory Visit: Payer: PPO | Admitting: Family Medicine

## 2016-04-21 ENCOUNTER — Encounter: Payer: Self-pay | Admitting: Family Medicine

## 2016-04-21 ENCOUNTER — Ambulatory Visit (INDEPENDENT_AMBULATORY_CARE_PROVIDER_SITE_OTHER): Payer: PPO | Admitting: Family Medicine

## 2016-04-21 DIAGNOSIS — Z029 Encounter for administrative examinations, unspecified: Secondary | ICD-10-CM | POA: Diagnosis not present

## 2016-04-21 DIAGNOSIS — R05 Cough: Secondary | ICD-10-CM | POA: Diagnosis not present

## 2016-04-21 DIAGNOSIS — R059 Cough, unspecified: Secondary | ICD-10-CM

## 2016-04-21 MED ORDER — BENZONATATE 200 MG PO CAPS: 200.0000 mg | ORAL_CAPSULE | Freq: Three times a day (TID) | ORAL | 1 refills | Status: DC | PRN

## 2016-04-21 NOTE — Patient Instructions (Addendum)
Use tessalon for the cough. Keep using nasal saline. If not better in a few days, we may have to start you on antibiotics.  Update me as needed.   Please send in your forms.  I'll keep a copy.  We'll go from there.   Take care.  Glad to see you.

## 2016-04-21 NOTE — Progress Notes (Signed)
Pre visit review using our clinic review tool, if applicable. No additional management support is needed unless otherwise documented below in the visit note. 

## 2016-04-21 NOTE — Progress Notes (Signed)
F/u re: cough.  Didn't tolerate codeine cough syrup.  Felt unsteady with codeine use and fell and had to get help to get up.  Asking about getting tessalon for cough.  Still with some post nasal gtt. No fevers. No sinus pressure/pain now.  Still using nasal saline.    Needed driving forms filled out.  See scanned sheets.   H/o DM2, complaint with tx, no low sugars, aware of sx for hypoglycemia.  A1c reasonable, no contraindication to driving.    HTN treated, no h/o CHF/defibrillator/etc.   H/o 6th nerve palsy, resolved, no double vision.  No contraindication to driving.   Meds, vitals, and allergies reviewed.   ROS: Per HPI unless specifically indicated in ROS section   GEN: nad, alert and oriented HEENT: mucous membranes moist, tm w/o erythema, nasal exam w/o erythema, clear discharge noted,  OP with cobblestoning, EOMI, sinuses not ttp NECK: supple w/o LA CV: rrr.   PULM: ctab, no inc wob EXT: no edema SKIN: no acute rash

## 2016-04-22 DIAGNOSIS — Z029 Encounter for administrative examinations, unspecified: Secondary | ICD-10-CM | POA: Insufficient documentation

## 2016-04-22 DIAGNOSIS — R059 Cough, unspecified: Secondary | ICD-10-CM | POA: Insufficient documentation

## 2016-04-22 DIAGNOSIS — Z0000 Encounter for general adult medical examination without abnormal findings: Secondary | ICD-10-CM | POA: Insufficient documentation

## 2016-04-22 DIAGNOSIS — R05 Cough: Secondary | ICD-10-CM | POA: Insufficient documentation

## 2016-04-22 NOTE — Assessment & Plan Note (Signed)
Likely from post nasal gtt.  Nontoxic.  Use tessalon for the cough. Keep using nasal saline. If not better in a few days, we may have to start him on antibiotics for possible evolving sinusitis.  Update me as needed.  He agrees.

## 2016-04-22 NOTE — Assessment & Plan Note (Signed)
No medical contraindication to driving.  D/w pt.  See scanned forms.  >25 minutes spent in face to face time with patient, >50% spent in counselling or coordination of care.

## 2016-05-20 ENCOUNTER — Other Ambulatory Visit (INDEPENDENT_AMBULATORY_CARE_PROVIDER_SITE_OTHER): Payer: PPO

## 2016-05-20 DIAGNOSIS — E119 Type 2 diabetes mellitus without complications: Secondary | ICD-10-CM

## 2016-05-20 DIAGNOSIS — E669 Obesity, unspecified: Secondary | ICD-10-CM

## 2016-05-20 LAB — HEMOGLOBIN A1C: Hgb A1c MFr Bld: 8.7 % — ABNORMAL HIGH (ref 4.6–6.5)

## 2016-05-22 ENCOUNTER — Ambulatory Visit: Payer: PPO | Admitting: Family Medicine

## 2016-05-26 ENCOUNTER — Encounter: Payer: Self-pay | Admitting: Family Medicine

## 2016-05-26 ENCOUNTER — Other Ambulatory Visit: Payer: Self-pay | Admitting: *Deleted

## 2016-05-26 ENCOUNTER — Ambulatory Visit (INDEPENDENT_AMBULATORY_CARE_PROVIDER_SITE_OTHER): Payer: PPO | Admitting: Family Medicine

## 2016-05-26 DIAGNOSIS — E669 Obesity, unspecified: Secondary | ICD-10-CM | POA: Diagnosis not present

## 2016-05-26 DIAGNOSIS — R202 Paresthesia of skin: Secondary | ICD-10-CM

## 2016-05-26 DIAGNOSIS — E1169 Type 2 diabetes mellitus with other specified complication: Secondary | ICD-10-CM

## 2016-05-26 MED ORDER — INSULIN LISPRO 100 UNIT/ML (KWIKPEN): 10.0000 [IU] | PEN_INJECTOR | Freq: Three times a day (TID) | SUBCUTANEOUS | 11 refills | Status: DC

## 2016-05-26 MED ORDER — GEMFIBROZIL 600 MG PO TABS: 600.0000 mg | ORAL_TABLET | Freq: Two times a day (BID) | ORAL | 1 refills | Status: DC

## 2016-05-26 MED ORDER — POLYETHYLENE GLYCOL 3350 17 G PO PACK: 17.0000 g | PACK | Freq: Every day | ORAL | Status: DC | PRN

## 2016-05-26 MED ORDER — GLUCOSE BLOOD VI STRP: ORAL_STRIP | 5 refills | Status: DC

## 2016-05-26 NOTE — Patient Instructions (Addendum)
Increase your mealtime insulin to 12 units.  If your post meal sugars are still above 160, then it is okay to go up to 14 units per meal.   Recheck in about 3 months.  Update me in the meantime.   Take care.  Glad to see you.

## 2016-05-26 NOTE — Telephone Encounter (Signed)
Received faxed refill request from pharmacy for one touch ultra test strips. Refill sent to pharmacy electronically

## 2016-05-26 NOTE — Progress Notes (Signed)
Pre visit review using our clinic review tool, if applicable. No additional management support is needed unless otherwise documented below in the visit note. 

## 2016-05-26 NOTE — Progress Notes (Signed)
Diabetes:  Using medications without difficulties: yes Hypoglycemic episodes: only if prolonged fasting but still not below 95.  Hyperglycemic episodes: up to 300, with diet changes.   Feet problems: some occ pain on the R 1st toe.   Blood Sugars averaging: see above.  Sugar log reviewed.   eye exam within last year:yes Wife had knee replacement and friends brought high carb foods and that was difficult for patient.   A1c up, d/w pt.   Still with some cough from post nasal gtt.  claritin and zyrtec didn't help.  Discussed with patient about trial of frequent nasal saline.  HLD.  Had been out of gemfibrozil, needed refill.  Prev took with statin w/o ADE.   LUQ abd wall skin sensitive to touch.  He does not have abdominal pain, meaning no sign of intra-abdominal pathology. No rash.  PMH and SH reviewed  Meds, vitals, and allergies reviewed.   ROS: Per HPI unless specifically indicated in ROS section   GEN: nad, alert and oriented HEENT: mucous membranes moist NECK: supple w/o LA CV: rrr. PULM: ctab, no inc wob ABD: soft, +bs, not tender but left upper quadrant abdominal wall skin is sensitive to touch in a dermatomal pattern without rash. EXT: no edema SKIN: no acute rash

## 2016-05-28 DIAGNOSIS — R202 Paresthesia of skin: Secondary | ICD-10-CM | POA: Insufficient documentation

## 2016-05-28 NOTE — Assessment & Plan Note (Signed)
Sugar log reviewed. Discussed with patient about diet and exercise.  Increase mealtime insulin to 12 units.  If post meal sugars are still above 160, then it is okay to go up to 14 units per meal.   Recheck in about 3 months.  He'll update me as needed in the meantime. >25 minutes spent in face to face time with patient, >50% spent in counselling or coordination of care.

## 2016-05-28 NOTE — Assessment & Plan Note (Signed)
Left upper abdominal wall. No rash. No sinus shingles at this point. Reasonable to observe for now. He could've irritated and intercostal nerve. I don't see sign of acute intra-abdominal pathology or any ominous diagnosis.

## 2016-05-29 IMAGING — DX DG PORTABLE PELVIS
1 series · 1 of 1 positions shown · non-contrast
Comparison: None.

CLINICAL DATA: Status post right hip arthroplasty.

EXAM:
PORTABLE PELVIS 1-2 VIEWS

[pelvis ap]
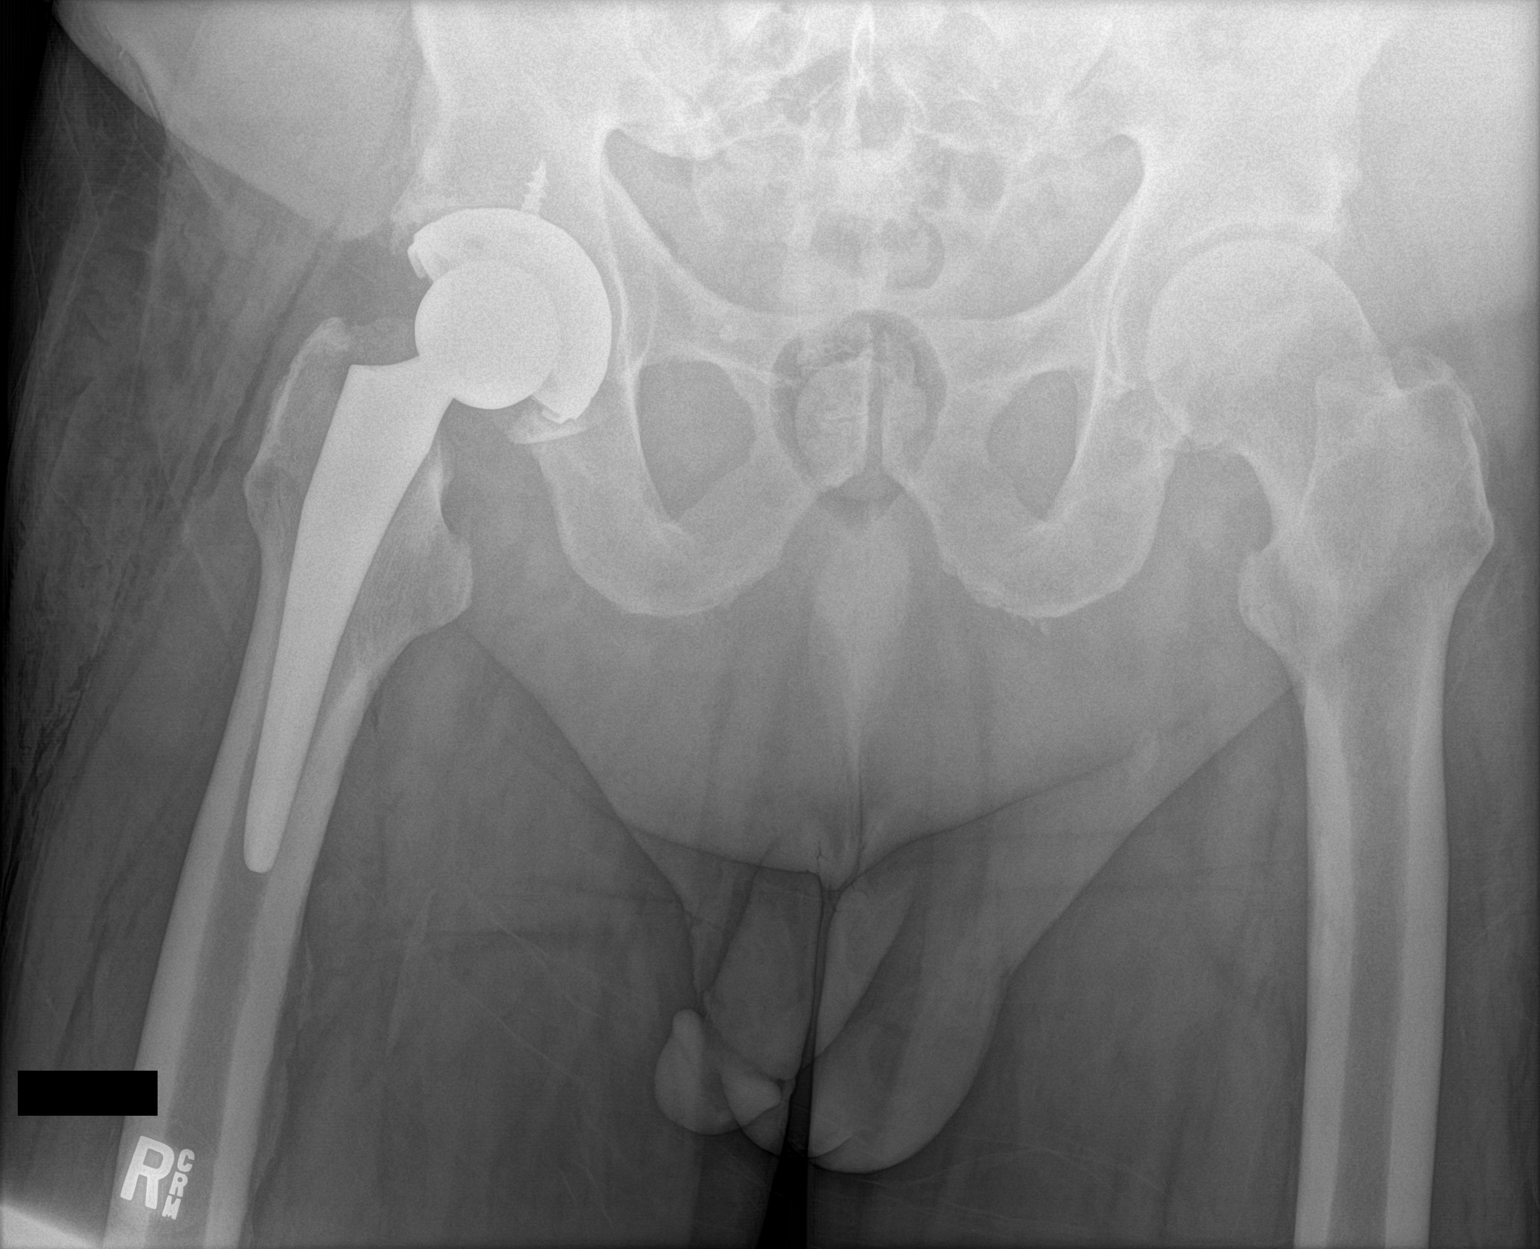

[1 of 1 positions shown; findings below may reference images not displayed]

FINDINGS: On the single AP view, the right hip arthroplasty components are
well-seated and aligned. There is no acute fracture or evidence of
an operative complication.
IMPRESSION: Well-aligned right hip total arthroplasty.

## 2016-06-08 ENCOUNTER — Other Ambulatory Visit: Payer: Self-pay

## 2016-06-08 MED ORDER — LOSARTAN POTASSIUM 100 MG PO TABS: 100.0000 mg | ORAL_TABLET | Freq: Every day | ORAL | 1 refills | Status: DC

## 2016-06-08 NOTE — Telephone Encounter (Signed)
Rx sent electronically.  

## 2016-07-07 DIAGNOSIS — D3131 Benign neoplasm of right choroid: Secondary | ICD-10-CM | POA: Diagnosis not present

## 2016-07-07 DIAGNOSIS — H401111 Primary open-angle glaucoma, right eye, mild stage: Secondary | ICD-10-CM | POA: Diagnosis not present

## 2016-07-07 DIAGNOSIS — E119 Type 2 diabetes mellitus without complications: Secondary | ICD-10-CM | POA: Diagnosis not present

## 2016-07-07 DIAGNOSIS — H401122 Primary open-angle glaucoma, left eye, moderate stage: Secondary | ICD-10-CM | POA: Diagnosis not present

## 2016-07-07 DIAGNOSIS — H2513 Age-related nuclear cataract, bilateral: Secondary | ICD-10-CM | POA: Diagnosis not present

## 2016-07-07 LAB — HM DIABETES EYE EXAM

## 2016-07-21 ENCOUNTER — Encounter: Payer: Self-pay | Admitting: Family Medicine

## 2016-07-29 ENCOUNTER — Other Ambulatory Visit: Payer: Self-pay

## 2016-07-29 MED ORDER — ALLOPURINOL 300 MG PO TABS: 150.0000 mg | ORAL_TABLET | ORAL | 3 refills | Status: DC

## 2016-07-29 MED ORDER — AMLODIPINE BESYLATE 10 MG PO TABS: 10.0000 mg | ORAL_TABLET | Freq: Every day | ORAL | 3 refills | Status: DC

## 2016-07-29 NOTE — Telephone Encounter (Signed)
Sent. Thanks.   

## 2016-07-29 NOTE — Telephone Encounter (Signed)
Pt left v/m requesting refills amlodipine (last refilled # 90 x 2 on 05/09/15) and allopurinol (last refilled # 45 x 3 on 08/06/15.) last seen 05/26/16. Please advise.[place].

## 2016-07-30 ENCOUNTER — Other Ambulatory Visit: Payer: Self-pay | Admitting: *Deleted

## 2016-08-18 ENCOUNTER — Telehealth: Payer: Self-pay | Admitting: Family Medicine

## 2016-08-18 ENCOUNTER — Emergency Department (HOSPITAL_COMMUNITY): Payer: PPO

## 2016-08-18 ENCOUNTER — Encounter (HOSPITAL_COMMUNITY): Payer: Self-pay | Admitting: Emergency Medicine

## 2016-08-18 ENCOUNTER — Emergency Department (HOSPITAL_COMMUNITY)
Admission: EM | Admit: 2016-08-18 | Discharge: 2016-08-18 | Disposition: A | Discharge status: Home / Self Care | Payer: PPO | Attending: Emergency Medicine | Admitting: Emergency Medicine

## 2016-08-18 DIAGNOSIS — Z96641 Presence of right artificial hip joint: Secondary | ICD-10-CM | POA: Insufficient documentation

## 2016-08-18 DIAGNOSIS — R200 Anesthesia of skin: Secondary | ICD-10-CM | POA: Diagnosis not present

## 2016-08-18 DIAGNOSIS — R29810 Facial weakness: Secondary | ICD-10-CM

## 2016-08-18 DIAGNOSIS — Z7982 Long term (current) use of aspirin: Secondary | ICD-10-CM | POA: Diagnosis not present

## 2016-08-18 DIAGNOSIS — E119 Type 2 diabetes mellitus without complications: Secondary | ICD-10-CM | POA: Insufficient documentation

## 2016-08-18 DIAGNOSIS — M6281 Muscle weakness (generalized): Secondary | ICD-10-CM | POA: Diagnosis not present

## 2016-08-18 DIAGNOSIS — Z96653 Presence of artificial knee joint, bilateral: Secondary | ICD-10-CM | POA: Diagnosis not present

## 2016-08-18 DIAGNOSIS — I10 Essential (primary) hypertension: Secondary | ICD-10-CM | POA: Insufficient documentation

## 2016-08-18 DIAGNOSIS — G510 Bell's palsy: Secondary | ICD-10-CM | POA: Insufficient documentation

## 2016-08-18 DIAGNOSIS — Z794 Long term (current) use of insulin: Secondary | ICD-10-CM | POA: Diagnosis not present

## 2016-08-18 LAB — CBC
HCT: 43.3 % (ref 39.0–52.0)
Hemoglobin: 15 g/dL (ref 13.0–17.0)
MCH: 31.1 pg (ref 26.0–34.0)
MCHC: 34.6 g/dL (ref 30.0–36.0)
MCV: 89.6 fL (ref 78.0–100.0)
Platelets: 295 10*3/uL (ref 150–400)
RBC: 4.83 MIL/uL (ref 4.22–5.81)
RDW: 14.1 % (ref 11.5–15.5)
WBC: 6.4 10*3/uL (ref 4.0–10.5)

## 2016-08-18 LAB — COMPREHENSIVE METABOLIC PANEL
ALT: 29 U/L (ref 17–63)
AST: 30 U/L (ref 15–41)
Albumin: 3.9 g/dL (ref 3.5–5.0)
Alkaline Phosphatase: 36 U/L — ABNORMAL LOW (ref 38–126)
Anion gap: 10 (ref 5–15)
BUN: 27 mg/dL — ABNORMAL HIGH (ref 6–20)
CO2: 23 mmol/L (ref 22–32)
Calcium: 9.9 mg/dL (ref 8.9–10.3)
Chloride: 104 mmol/L (ref 101–111)
Creatinine, Ser: 1.38 mg/dL — ABNORMAL HIGH (ref 0.61–1.24)
GFR calc Af Amer: 56 mL/min — ABNORMAL LOW (ref >60)
GFR calc non Af Amer: 48 mL/min — ABNORMAL LOW (ref >60)
Glucose, Bld: 133 mg/dL — ABNORMAL HIGH (ref 65–99)
Potassium: 4 mmol/L (ref 3.5–5.1)
Sodium: 137 mmol/L (ref 135–145)
Total Bilirubin: 0.7 mg/dL (ref 0.3–1.2)
Total Protein: 7.7 g/dL (ref 6.5–8.1)

## 2016-08-18 MED ORDER — VALACYCLOVIR HCL 1 G PO TABS: 1000.0000 mg | ORAL_TABLET | Freq: Three times a day (TID) | ORAL | 0 refills | Status: AC

## 2016-08-18 MED ORDER — PREDNISONE 20 MG PO TABS: 40.0000 mg | ORAL_TABLET | Freq: Every day | ORAL | 0 refills | Status: DC

## 2016-08-18 NOTE — ED Triage Notes (Signed)
Pt here with right sided facial droop starting Sunday; pt unable to close eye and watering on that side; pt sts hx of Bells Palsy and feels same

## 2016-08-18 NOTE — ED Provider Notes (Addendum)
[facility] DEPT Provider Note   CSN: [id number] Arrival date & time: 08/18/16  1023     History   Chief Complaint Chief Complaint  Patient presents with  . Facial Droop    HPI [name] is a 76 y.o. male.  76 year old male with hypertension, hyperlipidemia, type 2 diabetes mellitus who presents with right facial droop. 2 days ago, the patient started to notice facial droop first involving his right side. He had a similar episode last year and was diagnosed with Bell's palsy. He states the symptoms feel similar. He notes that his facial droop has seemed to wax and wane and was better yesterday evening but worse again when he woke up this morning. He reports numbness to the right side of his face. He denies any extremity numbness/weakness, balance problems, swallowing problems, shortness of breath, headache, vision changes, fever, or recent illness. No tick bites.   The history is provided by the patient.    Past Medical History:  Diagnosis Date  . Arthritis    arthirtis all over  . Concussion    multiple- college football player  . Diabetes mellitus type 2 in obese ([place])   . Frequent urination at night   . GERD (gastroesophageal reflux disease)    occ indigestion  . Glaucoma    both eyes  . Hyperlipemia   . Hypertension   . Kidney stones   . PONV (postoperative nausea and vomiting)   . PONV (postoperative nausea and vomiting)   . Primary localized osteoarthritis of right hip   . Primary osteoarthritis of right knee   . Restless leg syndrome   . Right knee DJD   . Spinal stenosis of lumbar region with radiculopathy    s/p L4/L5 transforaminal blocks    Patient Active Problem List   Diagnosis Date Noted  . Paresthesia 05/28/2016  . Administrative encounter 04/22/2016  . Cough 04/22/2016  . Viral URI with cough 03/12/2016  . Allergic rhinitis 03/12/2016  . 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 Erectile dysfunction 06/11/2015  . Rhinorrhea 03/13/2015  . S/P  revision of total hip 01/10/2015  . Dislocation, hip ([place]) 11/09/2014  . Primary osteoarthritis of right hip 11/07/2014  . DJD (degenerative joint disease) 11/06/2014  . DJD (degenerative joint disease) of knee 01/08/2014  . Primary osteoarthritis of right knee   . Primary localized osteoarthritis of right hip   . Spinal stenosis of lumbar region with radiculopathy   . Diabetes mellitus type 2 in obese ([place])   . PONV (postoperative nausea and vomiting)   . GERD (gastroesophageal reflux disease)   . Frequent urination at night   . Lumbar stenosis 04/11/2013  . Kidney stones   . Arthritis   . Neuromuscular disorder ([place])   . Right knee DJD   . Chronic calculus cholecystitis 12/31/2011    Past Surgical History:  Procedure Laterality Date  . ABDOMINAL HERNIA REPAIR    . ANTERIOR HIP REVISION Right 01/08/2015   Procedure: RIGHT ANTERIOR HIP REVISION;  Surgeon: Renette Butters, MD;  Location: [facility];  Service: Orthopedics;  Laterality: Right;  . BACK SURGERY    . COLONOSCOPY    . ELBOW ARTHROSCOPY  1990   right  . ELBOW SURGERY     right  . HERNIA REPAIR  1980   inguinal  . HIP CLOSED REDUCTION Right 11/09/2014   Procedure: CLOSED REDUCTION HIP, s/p total hip 8/9;  Surgeon: Ninetta Lights, MD;  Location: [facility];  Service: Orthopedics;  Laterality:  Right;  [name] JOINT REPLACEMENT  2008   knee  . LITHOTRIPSY    . [place] SURGERY     2015  . REVISION TOTAL HIP ARTHROPLASTY Right 01/08/2015  . SHOULDER ARTHROSCOPY  1980   right  . SHOULDER SURGERY     right  . STONE EXTRACTION WITH BASKET  2010  . TOTAL HIP ARTHROPLASTY Right 11/06/2014   Procedure: RIGHT TOTAL HIP ARTHROPLASTY ANTERIOR APPROACH;  Surgeon: Renette Butters, MD;  Location: [facility];  Service: Orthopedics;  Laterality: Right;  . TOTAL KNEE ARTHROPLASTY Left 07/2006   left knee  . TOTAL KNEE ARTHROPLASTY Right 01/08/2014   Procedure: RIGHT TOTAL KNEE ARTHROPLASTY;  Surgeon: Lorn Junes, MD;  Location: [facility];   Service: Orthopedics;  Laterality: Right;       Home Medications    Prior to Admission medications   Medication Sig Start Date End Date Taking? Authorizing Provider  allopurinol (ZYLOPRIM) 300 MG tablet Take 0.5 tablets (150 mg total) by mouth every morning. Take half a tablet (150mg ) by mouth daily 07/29/16   Tonia Ghent, MD  amLODipine (NORVASC) 10 MG tablet Take 1 tablet (10 mg total) by mouth daily. 07/29/16   Tonia Ghent, MD  aspirin 81 MG tablet Take 81 mg by mouth daily.    [provider]  atorvastatin (LIPITOR) 10 MG tablet Take 1 tablet (10 mg total) by mouth at bedtime. 03/09/16   Tonia Ghent, MD  gemfibrozil (LOPID) 600 MG tablet Take 1 tablet (600 mg total) by mouth 2 (two) times daily. 05/26/16   Tonia Ghent, MD  glipiZIDE (GLUCOTROL) 10 MG tablet Take 1 tablet (10 mg total) by mouth daily before breakfast. 11/28/15   Tonia Ghent, MD  glucose blood (ONE TOUCH ULTRA TEST) test strip Check blood sugar before and after each meal and as directed. Dx E11.9 05/26/16   Tonia Ghent, MD  insulin glargine (LANTUS) 100 UNIT/ML injection Inject 0.5 mLs (50 Units total) into the skin 2 (two) times daily. Disp up to 30 pens at once 02/18/16   Tonia Ghent, MD  insulin lispro (HUMALOG KWIKPEN) 100 UNIT/ML KiwkPen Inject 0.1-0.25 mLs (10-25 Units total) into the skin 3 (three) times daily. With meals 05/26/16   Tonia Ghent, MD  latanoprost (XALATAN) 0.005 % ophthalmic solution Place 1 drop into both eyes at bedtime.    [provider]  losartan (COZAAR) 100 MG tablet Take 1 tablet (100 mg total) by mouth daily. 06/08/16   Tonia Ghent, MD  metFORMIN (GLUCOPHAGE-XR) 500 MG 24 hr tablet Take 2 tablets (1,000 mg total) by mouth 2 (two) times daily. 10/15/15   Tonia Ghent, MD  Multiple Vitamins-Minerals (VITRUM 50+ SENIOR MULTI PO) Take 1 tablet by mouth daily.     [provider]  omeprazole (PRILOSEC) 20 MG capsule Take 20 mg by  mouth daily. 12/30/15   [provider]  polyethylene glycol (MIRALAX / GLYCOLAX) packet Take 17 g by mouth daily as needed. 05/26/16   Tonia Ghent, MD    Family History Family History  Problem Relation Age of Onset  . Hypertension Mother   . Kidney disease Mother   . Hypertension Father   . Diabetes Mellitus II Father   . Colon polyps Father   . Diabetes Mellitus II Brother   . Diabetes Mellitus II Brother   . Cancer - Prostate Brother   . Colon cancer Neg Hx     Social  History Social History  Substance Use Topics  . Smoking status: Never Smoker  . Smokeless tobacco: Never Used  . Alcohol use Yes     Comment: glass of wine every 3-4 months     Allergies   Morphine and related and Codeine sulfate   Review of Systems Review of Systems All other systems reviewed and are negative except that which was mentioned in HPI  Physical Exam Updated Vital Signs BP (!) 169/84   Pulse 77   Temp 97.5 F (36.4 C) (Oral)   Resp 19   SpO2 96%   Physical Exam  Constitutional: He is oriented to person, place, and time. He appears well-developed and well-nourished. No distress.  Awake, alert  HENT:  Head: Normocephalic and atraumatic.  Eyes: EOM are normal. Pupils are equal, round, and reactive to light.  B/l conjunctival injection R>L, tearing of R eye  Neck: Neck supple.  Cardiovascular: Normal rate, regular rhythm and normal heart sounds.   No murmur heard. Pulmonary/Chest: Effort normal and breath sounds normal. No respiratory distress.  Abdominal: Soft. Bowel sounds are normal. He exhibits no distension. There is no tenderness.  Musculoskeletal: He exhibits no edema.  Neurological: He is alert and oriented to person, place, and time. He has normal reflexes. He exhibits normal muscle tone.  Fluent speech, R sided facial paralysis involving forehead, cheek, and mouth; decreased sensation R face, remainder of CN intact  normal finger-to-nose testing, negative  pronator drift, no clonus 5/5 strength and normal sensation x all 4 extremities  Skin: Skin is warm and dry.  Psychiatric: He has a normal mood and affect. Judgment and thought content normal.  Nursing note and vitals reviewed.    ED Treatments / Results  Labs (all labs ordered are listed, but only abnormal results are displayed) Labs Reviewed  COMPREHENSIVE METABOLIC PANEL - Abnormal; Notable for the following:       Result Value   Glucose, Bld 133 (*)    BUN 27 (*)    Creatinine, Ser 1.38 (*)    Alkaline Phosphatase 36 (*)    GFR calc non Af Amer 48 (*)    GFR calc Af Amer 56 (*)    All other components within normal limits  CBC    EKG  EKG Interpretation None       Radiology Ct Head Wo Contrast  Result Date: 08/18/2016 CLINICAL DATA:  Right-sided facial droop for 3 days EXAM: CT HEAD WITHOUT CONTRAST TECHNIQUE: Contiguous axial images were obtained from the base of the skull through the vertex without intravenous contrast. COMPARISON:  Head CT 09/23/2015.  Brain MRI 09/24/2015 FINDINGS: Brain: Subtle low-density in the left anterior putamen and caudate head, possible interval infarct. Chronic microvascular ischemic change in the cerebral white matter, mild. No evidence of cortical infarct. No hemorrhage or hydrocephalus. Vascular: Atherosclerotic calcification.  No hyperdense vessel. Skull: No acute or aggressive finding Sinuses/Orbits: Negative IMPRESSION: 1. Equivocal for interval left basal ganglia lacunar infarct. 2. Negative for hemorrhage. Electronically Signed   By: Monte Fantasia M.D.   On: 08/18/2016 13:40    Procedures Procedures (including critical care time)  Medications Ordered in ED Medications - No data to display   Initial Impression / Assessment and Plan / ED Course  I have reviewed the triage vital signs and the nursing notes.  Pertinent labs & imaging results that were available during my care of the patient were reviewed by me and considered in  my medical decision making (see chart for details).  PT w/ 2 days of R sided facial droop that feels similar to previous episode of Bell's palsy involving the same side. He does know waxing and waning symptoms. On exam, he had an obvious right-sided facial paralysis involving his for head down to his mouth. He also had decreased sensation of R face. The remainder of his neurologic exam was normal.   CT obtained from triage shows questionable left basal ganglia lacunar infarct which does not correspond with his symptoms. I discussed his case with neurology, Dr. Leonel Ramsay, who agreed that his symptoms are not classic for Bell's palsy but his CT is concerning and he does have multiple risk factors for stroke. He recommended a stroke workup with MRI brain. I have ordered MRI and screening lab work. Labs show creatinine 1.38, BUN 27, otherwise unremarkable.  4:51 PM MRI negative for acute findings. Per Dr. Leonel Ramsay, if his MRI is negative for acute stroke, he can be treated with routine care for Bell's palsy with Valtrex and steroids. I have provided these medications for the patient and discussed supportive care including artificial tears and taping eye at night to protect against corneal abrasion. Instructed to follow-up with PCP. Extensively reviewed return precautions including any strokelike symptoms. Patient voiced understanding and was discharged in satisfactory condition.  Final Clinical Impressions(s) / ED Diagnoses   Final diagnoses:  Facial droop  Right facial numbness  Bell's palsy    New Prescriptions New Prescriptions   No medications on file     Little, Wenda Overland, MD 08/18/16 [place], Wenda Overland, MD 08/18/16 1651

## 2016-08-18 NOTE — ED Notes (Signed)
Pt back from MRI appears in no distress placed back on the monitor

## 2016-08-18 NOTE — Telephone Encounter (Signed)
Noted. Thanks.  Await ER notes.

## 2016-08-18 NOTE — Telephone Encounter (Signed)
°  Patient Name: [name]  DOB: 02-09-1941    Initial Comment Caller states woke up with mouth drawn on one side   Nurse Assessment  Nurse: Julien Girt RN, Almyra Free Date/Time Eilene Ghazi Time): 08/18/2016 9:25:24 AM  Confirm and document reason for call. If symptomatic, describe symptoms. ---Caller states he woke up with his mouth drawn on his right side and is having trouble talking. Adds his throat is sore, no fever but has a slight headache.  Does the patient have any new or worsening symptoms? ---Yes  Will a triage be completed? ---Yes  Related visit to physician within the last 2 weeks? ---No  Does the PT have any chronic conditions? (i.e. diabetes, asthma, etc.) ---Yes  List chronic conditions. ---Diabetes, Htn  Is this a behavioral health or substance abuse call? ---No     Guidelines    Guideline Title Affirmed Question Affirmed Notes  Neurologic Deficit [1] Weakness (i.e., paralysis, loss of muscle strength) of the face, arm / hand, or leg / foot on one side of the body AND [2] sudden onset AND [3] present now    Final Disposition User   Call EMS 911 Now Julien Girt, RN, [facility] - ED   Disagree/Comply: Disagree  Disagree/Comply Reason: Disagree with instructions   Reason: Caller refused 911, states he will have someone drive him to the ED. Claris verbalized understanding of all instructions and information.

## 2016-08-18 NOTE — ED Notes (Signed)
Patient transported to MRI 

## 2016-08-18 NOTE — Telephone Encounter (Signed)
Per chart tab pt is at [facility] ED.

## 2016-08-24 ENCOUNTER — Other Ambulatory Visit: Payer: Self-pay | Admitting: Family Medicine

## 2016-08-24 DIAGNOSIS — E1169 Type 2 diabetes mellitus with other specified complication: Secondary | ICD-10-CM

## 2016-08-24 DIAGNOSIS — E669 Obesity, unspecified: Principal | ICD-10-CM

## 2016-08-25 ENCOUNTER — Telehealth: Payer: Self-pay | Admitting: Family Medicine

## 2016-08-25 NOTE — Telephone Encounter (Signed)
1. I really hate to hear about his son in law.  2. We can try to get him set up with neuro when he gets back in town.  It may still get better in the meantime.   Thanks.

## 2016-08-25 NOTE — Telephone Encounter (Signed)
Patient called.  Patient was suppose to schedule an appointment for a  follow up with Dr.Duncan in 5 days, but patient called to let Dr.Duncan know he's in [place].  Patient's son-in-law is dying of cancer.  Patient said the Bells Palsy isn't going away.

## 2016-08-25 NOTE — Telephone Encounter (Signed)
Patient advised.

## 2016-08-30 ENCOUNTER — Telehealth: Payer: Self-pay | Admitting: Family Medicine

## 2016-08-30 DIAGNOSIS — R911 Solitary pulmonary nodule: Secondary | ICD-10-CM

## 2016-08-30 NOTE — Telephone Encounter (Signed)
See prev phone note.  I see that patient is out of town due to a family member's terminal illness.  When he gets back in town, he is due for f/u chest CT re: pulmonary nodules.   Will you please call him in a few days to see how he is doing and also about seeing when he'll be back in town to get the CT scheduled? I completely think his family should be his priority at this point, but I didn't want to let this go without follow up.  I didn't put in the order yet.   Thanks.

## 2016-09-01 ENCOUNTER — Other Ambulatory Visit: Payer: PPO

## 2016-09-04 ENCOUNTER — Ambulatory Visit: Payer: PPO | Admitting: Family Medicine

## 2016-09-08 NOTE — Telephone Encounter (Signed)
Left detailed message on voicemail of cell phone and home phone.

## 2016-09-13 NOTE — Telephone Encounter (Signed)
Please see if you can get up with patient at some point this week. Thanks. See below.

## 2016-09-14 NOTE — Telephone Encounter (Signed)
Noted. Thanks.  I hate to hear about his son in law.  We can work on getting this set up later.

## 2016-09-14 NOTE — Telephone Encounter (Signed)
Left detailed message on voicemail again on both home and cell phones.  I then phoned patient's wife's cell phone and she answered saying that their son-in-law died early 07-12-22 morning , Memorial service will be June 30 and they plan on flying home July 1, 2 or 3.  Wife will have patient call for CT to be scheduled after that time.

## 2016-10-05 NOTE — Telephone Encounter (Signed)
Pt left v/m that he is back home and request cb about appt for CT.

## 2016-10-06 NOTE — Telephone Encounter (Signed)
Pls see Rena's message that patient is now home and ready to schedule his CT Scan.

## 2016-10-06 NOTE — Addendum Note (Signed)
Addended by: Tonia Ghent on: 10/06/2016 08:30 AM   Modules accepted: Orders

## 2016-10-06 NOTE — Telephone Encounter (Signed)
Ordered. Thanks

## 2016-10-07 ENCOUNTER — Telehealth: Payer: Self-pay | Admitting: Family Medicine

## 2016-10-07 NOTE — Telephone Encounter (Signed)
Left message on voicemail for patient's wife to call back. 

## 2016-10-07 NOTE — Telephone Encounter (Signed)
Patient spouse would like a call back to pass some information on before her husband's visit with Dr. Damita Dunnings.  Please return her call.

## 2016-10-08 NOTE — Telephone Encounter (Signed)
In ER about 5 weeks ago with Bell's Palsy.  Wife is concerned about his attention to details in terms of driving, etc.  Appointment was scheduled for tomorrow at 9:30 am.  Apparently the follow up study that he has needed done has not been scheduled yet.

## 2016-10-08 NOTE — Telephone Encounter (Signed)
Noted. Thanks. Will see tomorrow.  

## 2016-10-09 ENCOUNTER — Ambulatory Visit (INDEPENDENT_AMBULATORY_CARE_PROVIDER_SITE_OTHER): Payer: PPO | Admitting: Family Medicine

## 2016-10-09 ENCOUNTER — Encounter: Payer: Self-pay | Admitting: Family Medicine

## 2016-10-09 VITALS — BP 148/82 | HR 88 | Temp 98.5°F | Wt 238.2 lb

## 2016-10-09 DIAGNOSIS — G510 Bell's palsy: Secondary | ICD-10-CM

## 2016-10-09 DIAGNOSIS — E1169 Type 2 diabetes mellitus with other specified complication: Secondary | ICD-10-CM | POA: Diagnosis not present

## 2016-10-09 DIAGNOSIS — E669 Obesity, unspecified: Secondary | ICD-10-CM | POA: Diagnosis not present

## 2016-10-09 LAB — HEMOGLOBIN A1C: Hgb A1c MFr Bld: 8.8 % — ABNORMAL HIGH (ref 4.6–6.5)

## 2016-10-09 NOTE — Patient Instructions (Signed)
Rosaria Ferries will call about your referral. Go to the lab on the way out.  We'll contact you with your lab report. Don't drive for now.  Try to get some rest.  Take care.  Glad to see you.
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
## 2016-10-11 ENCOUNTER — Encounter: Payer: Self-pay | Admitting: Family Medicine

## 2016-10-11 DIAGNOSIS — G510 Bell's palsy: Secondary | ICD-10-CM | POA: Insufficient documentation

## 2016-10-11 NOTE — Assessment & Plan Note (Signed)
Reasonable to have patient see neurology given the Bell's palsy. Discussed with him about options. Even setting aside concerns from his wife about his driving, it may still be reasonable for the patient not to drive at least temporarily. I am concerned that due to the Bell's he may have vision changes due to lip droop or excessive tearing that would potentially affect his driving. Discuss. He agrees.  I don't see evidence of ominous memory loss. I think it is very likely that he has difficulty with concentration and fatigue and recall, exacerbated by the schedule upheaval and the stress of recent events. We can follow clinically. >25 minutes spent in face to face time with patient, >50% spent in counselling or coordination of care .

## 2016-10-11 NOTE — Assessment & Plan Note (Signed)
See notes on labs. We can make plans about follow-up when I see his A1c. I expect his numbers not to be improved because of all the upheaval. Discussed. He agrees.

## 2016-10-12 ENCOUNTER — Ambulatory Visit (INDEPENDENT_AMBULATORY_CARE_PROVIDER_SITE_OTHER): Payer: PPO | Admitting: Neurology

## 2016-10-12 ENCOUNTER — Encounter: Payer: Self-pay | Admitting: Neurology

## 2016-10-12 VITALS — BP 149/88 | Ht 72.0 in | Wt 270.0 lb

## 2016-10-12 DIAGNOSIS — G510 Bell's palsy: Secondary | ICD-10-CM | POA: Diagnosis not present

## 2016-10-12 NOTE — Progress Notes (Signed)
[facility] [address] [place]. [place] [address] [phone]       OFFICE CONSULT NOTE  [name] Date of Birth:  11/19/1940 Medical Record Number:  [id number]   Referring MD: Tonia Ghent  Reason for Referral:  Bell`s palsy HPI: Mr Matty is a 76 year old pleasant Caucasian male who seen for new office consultation today upon request from Dr. Damita Dunnings. His accompanied by his wife. I have reviewed referral notes from primary physician, imaging films and electronic medical records personally. The patient was visiting [place] to look after his son-in-law who was sick from cancer in May this year when he developed sudden onset of pain in his right temple as well as occipital regions and neck strain notice he was unable to close his right eye and had right facial weakness. He refused to seek medical help there and upon arrival in [place] was eventually seen in the emergency room on 08/18/16. He underwent MRI scan of the brain which I personally reviewed and showed no acute abnormality. The patient Was treated with a two-week course of Valtrex and steroids which he has completed. He still has significant right-sided facial weakness. He is unable to close his eyes. He does use Systane eyedrops liberally during the day and uses Systane gel at night when sleeping. He denies any alteration of taste. He states that occasionally his bothered by loud sound. He denies any decreased sensation on the right side. He denies any hearing problems or ear pain. He has no prior history of Bell's palsy but does have a history of chickenpox as a child. He has no history of shingles. He denies any tick bite and does not have any pets who have tics. He has a remote history of runs and diplopia in 2016 for which he was seen in the hospital by Dr. 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 His diplopia cleared in 2-3 months. He denies significant  headaches, vertigo, gait or balance problems. He does have diabetes and feels his sugars are difficult to control.He does have some redness in his right eye but denies significant pain or dryness of infection. He has not seen an eye doctor. ROS:   14 system review of systems is positive for  Eye pain, eye redness, facial weakness, joint pain, allergies, snoring and all other systems negative PMH:  Past Medical History:  Diagnosis Date  . Arthritis    arthirtis all over  . Bell's palsy   . Concussion    multiple- college football player  . Diabetes mellitus type 2 in obese ([place])   . Frequent urination at night   . GERD (gastroesophageal reflux disease)    occ indigestion  . Glaucoma    both eyes  . Hyperlipemia   . Hypertension   . Kidney stones   . PONV (postoperative nausea and vomiting)   . PONV (postoperative nausea and vomiting)   . Primary localized osteoarthritis of right hip   . Primary osteoarthritis of right knee   . Restless leg syndrome   . Right knee DJD   . Spinal stenosis of lumbar region with radiculopathy    s/p L4/L5 transforaminal blocks    Social History:  Social History   Social History  . Marital status: Married    Spouse name: N/A  . Number of children: N/A  . Years of education: N/A   Occupational History  . Not on file.   Social History Main Topics  .  Smoking status: Never Smoker  . Smokeless tobacco: Never Used  . Alcohol use Yes     Comment: glass of wine every 3-4 months  . Drug use: No  . Sexual activity: No   Other Topics Concern  . 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    1 son, 1 daughter, both out of state    Medications:   Current Outpatient Prescriptions on File Prior to Visit  Medication Sig Dispense Refill  . allopurinol (ZYLOPRIM) 300 MG tablet Take 0.5 tablets (150 mg total) by mouth every morning. Take half a tablet (150mg )  by mouth daily 45 tablet 3  . amLODipine (NORVASC) 10 MG tablet Take 1 tablet (10 mg total) by mouth daily. 90 tablet 3  . aspirin 81 MG tablet Take 81 mg by mouth daily.    [name] atorvastatin (LIPITOR) 10 MG tablet Take 1 tablet (10 mg total) by mouth at bedtime. 90 tablet 3  . gemfibrozil (LOPID) 600 MG tablet Take 1 tablet (600 mg total) by mouth 2 (two) times daily. 180 tablet 1  . glipiZIDE (GLUCOTROL) 10 MG tablet Take 1 tablet (10 mg total) by mouth daily before breakfast. 180 tablet 1  . glucose blood (ONE TOUCH ULTRA TEST) test strip Check blood sugar before and after each meal and as directed. Dx E11.9 200 each 5  . insulin glargine (LANTUS) 100 UNIT/ML injection Inject 0.5 mLs (50 Units total) into the skin 2 (two) times daily. Disp up to 30 pens at once 150 mL prn  . insulin lispro (HUMALOG KWIKPEN) 100 UNIT/ML KiwkPen Inject 0.1-0.25 mLs (10-25 Units total) into the skin 3 (three) times daily. With meals 15 mL 11  . latanoprost (XALATAN) 0.005 % ophthalmic solution Place 1 drop into both eyes at bedtime.    [name] losartan (COZAAR) 100 MG tablet Take 1 tablet (100 mg total) by mouth daily. 90 tablet 1  . metFORMIN (GLUCOPHAGE-XR) 500 MG 24 hr tablet Take 2 tablets (1,000 mg total) by mouth 2 (two) times daily. 360 tablet 3  . Multiple Vitamins-Minerals (VITRUM 50+ SENIOR MULTI PO) Take 1 tablet by mouth daily.      No current facility-administered medications on file prior to visit.     Allergies:   Allergies  Allergen Reactions  . Morphine And Related     Causes nausea & vomiting  . Codeine Sulfate Other (See Comments)    intolerant    Physical Exam General: well developed, well nourished, seated, in no evident distress. Mild conjunctival injection and chemosis in the right eye. Head: head normocephalic and atraumatic.   Neck: supple with no carotid or supraclavicular bruits Cardiovascular: regular rate and rhythm, no murmurs Musculoskeletal: no deformity.Old surgical scars off  knee replacement on both sides Skin:  no rash/petichiae Vascular:  Normal pulses all extremities  Neurologic Exam Mental Status: Awake and fully alert. Oriented to place and time. Recent and remote memory intact. Attention span, concentration and fund of knowledge appropriate. Mood and affect appropriate.  Cranial Nerves: Fundoscopic exam reveals sharp disc margins. Pupils equal, briskly reactive to light. Extraocular movements full without nystagmus. Visual fields full to confrontation. Hearing intact. Facial sensation intact.  peripheral seventh nerve palsy. Unable to wrinkle the right half of the forehead but can move her right eyebrow slightly. Unable to close right eye completely. Unable to puff out his cheeks are below his  lips on the right side., tongue, palate moves normally and symmetrically.  Motor: Normal bulk and tone. Normal strength in all tested extremity muscles. Sensory.: intact to touch , pinprick , position and vibratory sensation.  Coordination: Rapid alternating movements normal in all extremities. Finger-to-nose and heel-to-shin performed accurately bilaterally. Gait and Station: Arises from chair without difficulty. Stance is normal. Gait demonstrates normal stride length and balance . Able to heel, toe and tandem walk without difficulty.  Reflexes: 1+ and symmetric. Toes downgoing.       ASSESSMENT: 76 year old Caucasian male with right peripheral seventh nerve palsy in May 2018 due to Bell's palsy. Remote history of Diplopia due to left 6 th nerve  palsy 2 years ago probably due to diabetic microvascular disease    PLAN: I had a long discussion with the patient and his wife regarding his recent right-sided Bell's palsy and answered questions and reviewed imaging studies. I recommend he continue to use artificial tears liberally during the day and use Lacri-Lube ointment at night. I do not believe further diagnostic testing is necessary at this point. I expect slow  improvement. I advised him to do regular facial muscle exercises.Greater than 50% time during this 45 minute consultation visit was spent on counseling and coordination of care about his Bell's palsy and discussion about treatment, prognosis and answering questions. He'll return for follow-up in 3 months with my assistant Ward Givens, nurse practitioner call earlier if necessary Antony Contras, MD  [facility] [address] [place], [place] [address]  Phone [phone] Fax [phone] Note: This document was prepared with digital dictation and possible smart phrase technology. Any transcriptional errors that result from this process are unintentional.

## 2016-10-12 NOTE — Patient Instructions (Signed)
I had a long discussion with the patient and his wife regarding his recent right-sided Bell's palsy and answered questions and reviewed imaging studies. I recommend he continue to use artificial tears liberally during the day and use Lacri-Lube ointment at night. I do not believe further diagnostic testing is necessary at this point. I expect slow improvement. I advised him to do regular fascial muscle exercises. He'll return for follow-up in 3 months with my assistant Ward Givens, nurse practitioner call earlier if necessary   Bell Palsy, Adult Bell palsy is a short-term inability to move muscles in part of the face. The inability to move (paralysis) results from inflammation or compression of the facial nerve, which travels along the skull and under the ear to the side of the face (7th cranial nerve). This nerve is responsible for facial movements that include blinking, closing the eyes, smiling, and frowning. What are the causes? The exact cause of this condition is not known. It may be caused by an infection from a virus, such as the chickenpox (herpes zoster), Epstein-Barr, or mumps virus. What increases the risk? You are more likely to develop this condition if:  You are pregnant.  You have diabetes.  You have had a recent infection in your nose, throat, or airways (upper respiratory infection).  You have a weakened body defense system (immune system).  You have had a facial injury, such as a fracture.  You have a family history of Bell palsy.  What are the signs or symptoms? Symptoms of this condition include:  Weakness on one side of the face.  Drooping eyelid and corner of the mouth.  Excessive tearing in one eye.  Difficulty closing the eyelid.  Dry eye.  Drooling.  Dry mouth.  Changes in taste.  Change in facial appearance.  Pain behind one ear.  Ringing in one or both ears.  Sensitivity to sound in one ear.  Facial twitching.  Headache.  Impaired  speech.  Dizziness.  Difficulty eating or drinking.  Most of the time, only one side of the face is affected. Rarely, Bell palsy affects the whole face. How is this diagnosed? This condition is diagnosed based on:  Your symptoms.  Your medical history.  A physical exam.  You may also have to see health care providers who specialize in disorders of the nerves (neurologist) or diseases and conditions of the eye (ophthalmologist). You may have tests, such as:  A test to check for nerve damage (electromyogram).  Imaging studies, such as CT or MRI scans.  Blood tests.  How is this treated? This condition affects every person differently. Sometimes symptoms go away without treatment within a couple weeks. If treatment is needed, it varies from person to person. The goal of treatment is to reduce inflammation and protect the eye from damage. Treatment for Bell palsy may include:  Medicines, such as: ? Steroids to reduce swelling and inflammation. ? Antiviral drugs. ? Pain relievers, including aspirin, acetaminophen, or ibuprofen.  Eye drops or ointment to keep your eye moist.  Eye protection, if you cannot close your eye.  Exercises or massage to regain muscle strength and function (physical therapy).  Follow these instructions at home:  Take over-the-counter and prescription medicines only as told by your health care provider.  If your eye is affected: ? Keep your eye moist with eye drops or ointment as told by your health care provider. ? Follow instructions for eye care and protection as told by your health care provider.  Do any physical therapy exercises as told by your health care provider.  Keep all follow-up visits as told by your health care provider. This is important. Contact a health care provider if:  You have a fever.  Your symptoms do not get better within 2-3 weeks, or your symptoms get worse.  Your eye is red, irritated, or painful.  You have new  symptoms. Get help right away if:  You have weakness or numbness in a part of your body other than your face.  You have trouble swallowing.  You develop neck pain or stiffness.  You develop dizziness or shortness of breath. Summary  Bell palsy is a short-term inability to move muscles in part of the face. The inability to move (paralysis) results from inflammation or compression of the facial nerve.  This condition affects every person differently. Sometimes symptoms go away without treatment within a couple weeks.  If treatment is needed, it varies from person to person. The goal of treatment is to reduce inflammation and protect the eye from damage.  Contact your health care provider if your symptoms do not get better within 2-3 weeks, or your symptoms get worse. This information is not intended to replace advice given to you by your health care provider. Make sure you discuss any questions you have with your health care provider. Document Released: 03/16/2005 Document Revised: 05/19/2016 Document Reviewed: 05/19/2016 Elsevier Interactive Patient Education  [employer].

## 2016-10-16 ENCOUNTER — Ambulatory Visit (INDEPENDENT_AMBULATORY_CARE_PROVIDER_SITE_OTHER)
Admission: RE | Admit: 2016-10-16 | Discharge: 2016-10-16 | Disposition: A | Discharge status: Home / Self Care | Payer: PPO | Source: Ambulatory Visit | Attending: Family Medicine | Admitting: Family Medicine

## 2016-10-16 ENCOUNTER — Encounter: Payer: Self-pay | Admitting: Family Medicine

## 2016-10-16 DIAGNOSIS — R911 Solitary pulmonary nodule: Secondary | ICD-10-CM | POA: Diagnosis not present

## 2016-10-16 DIAGNOSIS — R918 Other nonspecific abnormal finding of lung field: Secondary | ICD-10-CM | POA: Diagnosis not present

## 2016-10-19 ENCOUNTER — Other Ambulatory Visit: Payer: PPO

## 2016-10-23 ENCOUNTER — Ambulatory Visit: Payer: PPO | Admitting: Family Medicine

## 2016-11-11 ENCOUNTER — Other Ambulatory Visit: Payer: Self-pay | Admitting: *Deleted

## 2016-11-11 MED ORDER — GLIPIZIDE 10 MG PO TABS: 10.0000 mg | ORAL_TABLET | Freq: Every day | ORAL | 0 refills | Status: DC

## 2016-11-11 NOTE — Telephone Encounter (Signed)
Received faxed refill request from pharmacy for Glipizide. Refill sent to pharmacy electronically.

## 2016-11-17 ENCOUNTER — Other Ambulatory Visit: Payer: Self-pay | Admitting: *Deleted

## 2016-11-17 MED ORDER — METFORMIN HCL ER 500 MG PO TB24: 1000.0000 mg | ORAL_TABLET | Freq: Two times a day (BID) | ORAL | 3 refills | Status: DC

## 2016-12-04 ENCOUNTER — Other Ambulatory Visit: Payer: Self-pay | Admitting: *Deleted

## 2016-12-04 MED ORDER — GEMFIBROZIL 600 MG PO TABS: 600.0000 mg | ORAL_TABLET | Freq: Two times a day (BID) | ORAL | 1 refills | Status: DC

## 2016-12-15 ENCOUNTER — Other Ambulatory Visit: Payer: Self-pay | Admitting: *Deleted

## 2016-12-15 MED ORDER — LANTUS SOLOSTAR 100 UNIT/ML ~~LOC~~ SOPN: PEN_INJECTOR | SUBCUTANEOUS | 11 refills | Status: DC

## 2016-12-21 ENCOUNTER — Other Ambulatory Visit: Payer: Self-pay | Admitting: *Deleted

## 2016-12-21 MED ORDER — ATORVASTATIN CALCIUM 10 MG PO TABS: 10.0000 mg | ORAL_TABLET | Freq: Every day | ORAL | 3 refills | Status: DC

## 2016-12-21 MED ORDER — LOSARTAN POTASSIUM 100 MG PO TABS: 100.0000 mg | ORAL_TABLET | Freq: Every day | ORAL | 3 refills | Status: DC

## 2017-01-06 DIAGNOSIS — H401123 Primary open-angle glaucoma, left eye, severe stage: Secondary | ICD-10-CM | POA: Diagnosis not present

## 2017-01-06 DIAGNOSIS — H02132 Senile ectropion of right lower eyelid: Secondary | ICD-10-CM | POA: Diagnosis not present

## 2017-01-06 DIAGNOSIS — G510 Bell's palsy: Secondary | ICD-10-CM | POA: Diagnosis not present

## 2017-01-06 DIAGNOSIS — H401111 Primary open-angle glaucoma, right eye, mild stage: Secondary | ICD-10-CM | POA: Diagnosis not present

## 2017-01-06 DIAGNOSIS — H04123 Dry eye syndrome of bilateral lacrimal glands: Secondary | ICD-10-CM | POA: Diagnosis not present

## 2017-01-06 DIAGNOSIS — H2513 Age-related nuclear cataract, bilateral: Secondary | ICD-10-CM | POA: Diagnosis not present

## 2017-01-07 ENCOUNTER — Other Ambulatory Visit (INDEPENDENT_AMBULATORY_CARE_PROVIDER_SITE_OTHER): Payer: PPO

## 2017-01-07 DIAGNOSIS — H02152 Paralytic ectropion of right lower eyelid: Secondary | ICD-10-CM | POA: Diagnosis not present

## 2017-01-07 DIAGNOSIS — E1169 Type 2 diabetes mellitus with other specified complication: Secondary | ICD-10-CM

## 2017-01-07 DIAGNOSIS — G510 Bell's palsy: Secondary | ICD-10-CM | POA: Diagnosis not present

## 2017-01-07 DIAGNOSIS — E669 Obesity, unspecified: Secondary | ICD-10-CM | POA: Diagnosis not present

## 2017-01-07 DIAGNOSIS — H16211 Exposure keratoconjunctivitis, right eye: Secondary | ICD-10-CM | POA: Diagnosis not present

## 2017-01-07 LAB — HEMOGLOBIN A1C: Hgb A1c MFr Bld: 8.6 % — ABNORMAL HIGH (ref 4.6–6.5)

## 2017-01-12 ENCOUNTER — Ambulatory Visit (INDEPENDENT_AMBULATORY_CARE_PROVIDER_SITE_OTHER): Payer: PPO | Admitting: Adult Health

## 2017-01-12 ENCOUNTER — Encounter: Payer: Self-pay | Admitting: Adult Health

## 2017-01-12 VITALS — BP 168/94 | HR 92 | Resp 20 | Ht 72.0 in | Wt 265.0 lb

## 2017-01-12 DIAGNOSIS — G510 Bell's palsy: Secondary | ICD-10-CM | POA: Diagnosis not present

## 2017-01-12 NOTE — Patient Instructions (Signed)
Your Plan:  Continue doing facial exercises      Thank you for coming to see [place] at [facility]. I hope we have been able to provide you high quality care today.  You may receive a patient satisfaction survey over the next few weeks. We would appreciate your feedback and comments so that we may continue to improve ourselves and the health of our patients.

## 2017-01-12 NOTE — Progress Notes (Signed)
PATIENT: [name] DOB: [birth year]  REASON FOR VISIT: follow up HISTORY FROM: patient  HISTORY OF PRESENT ILLNESS: Today 01/12/17  [name] is a 76-ear-old male with a history of Bell's palsy.  He returns today for follow-up. He does feel that his symptoms have improved.  he states that he continues to have trouble with closure of the right eye. He states the lower lid is drooping. he has seen his ophthalmologist Dr. Katy Fitch. He was referred to a cosmetic surgeon at [facility]. He is considering surgery to improve the lower lid. Right now he continues to use artificial tears as well as lubrication to protect the eye.  The patient continues to have a slight facial droop on the right side of the mouth. He states that he is able to  hold liquids on that side although on occasion liquids will leak out of the right corner of the mouth. He states that he is talking better. He reports his is been a slow recovery. He returns today for an evaluation.   HISTORY [name] is a 76 year old pleasant Caucasian male who seen for new office consultation today upon request from Dr. Damita Dunnings. His accompanied by his wife. I have reviewed referral notes from primary physician, imaging films and electronic medical records personally. The patient was visiting [place] to look after his son-in-law who was sick from cancer in May this year when he developed sudden onset of pain in his right temple as well as occipital regions and neck strain notice he was unable to close his right eye and had right facial weakness. He refused to seek medical help there and upon arrival in [place] was eventually seen in the emergency room on 08/18/16. He underwent MRI scan of the brain which I personally reviewed and showed no acute abnormality. The patient Was treated with a two-week course of Valtrex and steroids which he has completed. He still has significant right-sided facial weakness. He is unable to close his eyes. He does  use Systane eyedrops liberally during the day and uses Systane gel at night when sleeping. He denies any alteration of taste. He states that occasionally his bothered by loud sound. He denies any decreased sensation on the right side. He denies any hearing problems or ear pain. He has no prior history of Bell's palsy but does have a history of chickenpox as a child. He has no history of shingles. He denies any tick bite and does not have any pets who have tics. He has a remote history of runs and diplopia in 2016 for which he was seen in the hospital by Dr. 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 His diplopia cleared in 2-3 months. He denies significant headaches, vertigo, gait or balance problems. He does have diabetes and feels his sugars are difficult to control.He does have some redness in his right eye but denies significant pain or dryness of infection. He has not seen an eye doctor.  REVIEW OF SYSTEMS: Out of a complete 14 system review of symptoms, the patient complains only of the following symptoms, and all other reviewed systems are negative.  Eye discharge, eye pain  ALLERGIES: Allergies  Allergen Reactions  . Morphine And Related     Causes nausea & vomiting  . Codeine Sulfate Other (See Comments)    intolerant    HOME MEDICATIONS: Outpatient Medications Prior to Visit  Medication Sig Dispense Refill  . allopurinol (ZYLOPRIM) 300 MG tablet Take 0.5  tablets (150 mg total) by mouth every morning. Take half a tablet (150mg ) by mouth daily 45 tablet 3  . amLODipine (NORVASC) 10 MG tablet Take 1 tablet (10 mg total) by mouth daily. 90 tablet 3  . aspirin 81 MG tablet Take 81 mg by mouth daily.    [name] atorvastatin (LIPITOR) 10 MG tablet Take 1 tablet (10 mg total) by mouth at bedtime. 90 tablet 3  . gemfibrozil (LOPID) 600 MG tablet Take 1 tablet (600 mg total) by mouth 2 (two) times daily. 180 tablet 1  . glipiZIDE (GLUCOTROL) 10 MG tablet Take 1  tablet (10 mg total) by mouth daily before breakfast. 180 tablet 0  . glucose blood (ONE TOUCH ULTRA TEST) test strip Check blood sugar before and after each meal and as directed. Dx E11.9 200 each 5  . insulin glargine (LANTUS) 100 UNIT/ML injection Inject 0.5 mLs (50 Units total) into the skin 2 (two) times daily. Disp up to 30 pens at once 150 mL prn  . insulin lispro (HUMALOG KWIKPEN) 100 UNIT/ML KiwkPen Inject 0.1-0.25 mLs (10-25 Units total) into the skin 3 (three) times daily. With meals 15 mL 11  . LANTUS SOLOSTAR 100 UNIT/ML Solostar Pen INJECT 65 UNITS SUBCUTANEOUSLY TWICE DAILY 15 mL 11  . latanoprost (XALATAN) 0.005 % ophthalmic solution Place 1 drop into both eyes at bedtime.    [name] losartan (COZAAR) 100 MG tablet Take 1 tablet (100 mg total) by mouth daily. 90 tablet 3  . metFORMIN (GLUCOPHAGE-XR) 500 MG 24 hr tablet Take 2 tablets (1,000 mg total) by mouth 2 (two) times daily. 360 tablet 3  . Multiple Vitamins-Minerals (VITRUM 50+ SENIOR MULTI PO) Take 1 tablet by mouth daily.     . predniSONE (DELTASONE) 20 MG tablet      No facility-administered medications prior to visit.     PAST MEDICAL HISTORY: Past Medical History:  Diagnosis Date  . Arthritis    arthirtis all over  . Bell's palsy   . Concussion    multiple- college football player  . Diabetes mellitus type 2 in obese ([place])   . Frequent urination at night   . GERD (gastroesophageal reflux disease)    occ indigestion  . Glaucoma    both eyes  . Hyperlipemia   . Hypertension   . Kidney stones   . PONV (postoperative nausea and vomiting)   . Primary localized osteoarthritis of right hip   . Primary osteoarthritis of right knee   . Restless leg syndrome   . Right knee DJD   . Spinal stenosis of lumbar region with radiculopathy    s/p L4/L5 transforaminal blocks    PAST SURGICAL HISTORY: Past Surgical History:  Procedure Laterality Date  . ABDOMINAL HERNIA REPAIR    . ANTERIOR HIP REVISION Right 01/08/2015     Procedure: RIGHT ANTERIOR HIP REVISION;  Surgeon: Renette Butters, MD;  Location: [facility];  Service: Orthopedics;  Laterality: Right;  . BACK SURGERY    . COLONOSCOPY    . ELBOW ARTHROSCOPY  1990   right  . ELBOW SURGERY     right  . HERNIA REPAIR  1980   inguinal  . HIP CLOSED REDUCTION Right 11/09/2014   Procedure: CLOSED REDUCTION HIP, s/p total hip 8/9;  Surgeon: Ninetta Lights, MD;  Location: [facility];  Service: Orthopedics;  Laterality: Right;  . JOINT REPLACEMENT  2008   knee  . LITHOTRIPSY    . [place] SURGERY     2015  .  REVISION TOTAL HIP ARTHROPLASTY Right 01/08/2015  . SHOULDER ARTHROSCOPY  1980   right  . SHOULDER SURGERY     right  . STONE EXTRACTION WITH BASKET  2010  . TOTAL HIP ARTHROPLASTY Right 11/06/2014   Procedure: RIGHT TOTAL HIP ARTHROPLASTY ANTERIOR APPROACH;  Surgeon: Renette Butters, MD;  Location: [facility];  Service: Orthopedics;  Laterality: Right;  . TOTAL KNEE ARTHROPLASTY Left 07/2006   left knee  . TOTAL KNEE ARTHROPLASTY Right 01/08/2014   Procedure: RIGHT TOTAL KNEE ARTHROPLASTY;  Surgeon: Lorn Junes, MD;  Location: [facility];  Service: Orthopedics;  Laterality: Right;    FAMILY HISTORY: Family History  Problem Relation Age of Onset  . Hypertension Mother   . Kidney disease Mother   . Hypertension Father   . Diabetes Mellitus II Father   . Colon polyps Father   . Diabetes Mellitus II Brother   . Diabetes Mellitus II Brother   . Cancer - Prostate Brother   . Colon cancer Neg Hx     SOCIAL HISTORY: Social History   Social History  . Marital status: Married    Spouse name: N/A  . Number of children: N/A  . Years of education: N/A   Occupational History  . Not on file.   Social History Main Topics  . Smoking status: Never Smoker  . Smokeless tobacco: Never Used  . Alcohol use Yes     Comment: glass of wine every 3-4 months  . Drug use: No  . Sexual activity: No   Other Topics Concern  . 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    1 son, 1 daughter, both out of state      PHYSICAL EXAM  Vitals:   01/12/17 0743  BP: (!) 168/94  Pulse: 92  Resp: 20  Weight: 265 lb (120.2 kg)  Height: 6' (1.829 m)   Body mass index is 35.94 kg/m.  Generalized: Well developed, in no acute distress   Neurological examination  Mentation: Alert oriented to time, place, history taking. Follows all commands speech and language fluent Cranial nerve II-XII: Pupils were equal round reactive to light. Extraocular movements were full, visual field were full on confrontational test.  Uvula tongue midline. Head turning and shoulder shrug  were normal and symmetric. drooping of the right lower lid. Puff cheeks weakness on the right. Facial droop in the right corner of the mouth Motor: The motor testing reveals 5 over 5 strength of all 4 extremities. Good symmetric motor tone is noted throughout.  Sensory: Sensory testing is intact to soft touch on all 4 extremities. No evidence of extinction is noted.  Coordination: Cerebellar testing reveals good finger-nose-finger and heel-to-shin bilaterally.  Gait and station:  Patient has a slight limp when he ambulates. Tandem gait not attempted. Reflexes: Deep tendon reflexes are symmetric and normal bilaterally.   DIAGNOSTIC DATA (LABS, IMAGING, TESTING) - I reviewed patient records, labs, notes, testing and imaging myself where available.  Lab Results  Component Value Date   WBC 6.4 08/18/2016   HGB 15.0 08/18/2016   HCT 43.3 08/18/2016   MCV 89.6 08/18/2016   PLT 295 08/18/2016      Component Value Date/Time   NA 137 08/18/2016 1522   K 4.0 08/18/2016 1522   CL 104 08/18/2016 1522   CO2 23 08/18/2016 1522   GLUCOSE 133 (H) 08/18/2016  1522   BUN 27 (H) 08/18/2016 1522   CREATININE 1.38 (H) 08/18/2016 1522   CREATININE 1.44 12/21/2014   CALCIUM 9.9 08/18/2016 1522   PROT 7.7  08/18/2016 1522   ALBUMIN 3.9 08/18/2016 1522   AST 30 08/18/2016 1522   AST 16 12/21/2014   ALT 29 08/18/2016 1522   ALT 15 12/21/2014   ALKPHOS 36 (L) 08/18/2016 1522   BILITOT 0.7 08/18/2016 1522   GFRNONAA 48 (L) 08/18/2016 1522   GFRAA 56 (L) 08/18/2016 1522   Lab Results  Component Value Date   CHOL 256 (H) 10/07/2015   HDL 32.20 (L) 10/07/2015   LDLCALC 82 05/09/2014   LDLDIRECT 62.0 10/07/2015   TRIG (H) 10/07/2015    943.0 Triglyceride is over 400; calculations on Lipids are invalid.   CHOLHDL 8 10/07/2015   Lab Results  Component Value Date   HGBA1C 8.6 (H) 01/07/2017      ASSESSMENT AND PLAN 76 y.o. year old male  has a past medical history of Arthritis; Bell's palsy; Concussion; Diabetes mellitus type 2 in obese ([place]); Frequent urination at night; GERD (gastroesophageal reflux disease); Glaucoma; Hyperlipemia; Hypertension; Kidney stones; PONV (postoperative nausea and vomiting); Primary localized osteoarthritis of right hip; Primary osteoarthritis of right knee; Restless leg syndrome; Right knee DJD; and Spinal stenosis of lumbar region with radiculopathy. here with:   1. Bell's palsy   The patient reports slight improvement In his symptoms. I have encouraged him to continue doing the facial exercises. He should continue regular follow-ups with his ophthalmologist. He is advised that if his symptoms worsen or he develops new symptoms he should let us know. He will follow-up in 6 months with Dr. Leonie Man. If he is doing well with this visit he will follow-up as needed.  Patient is amenable to this plan.  I spent 15 minutes with the patient. 50% of this time was spent discussing his symptoms.      Ward Givens, MSN, NP-C 01/12/2017, 7:58 AM [facility] [address], [place], [place] [address] [phone]

## 2017-01-12 NOTE — Progress Notes (Signed)
I have reviewed and agreed above plan. 

## 2017-01-14 ENCOUNTER — Ambulatory Visit (INDEPENDENT_AMBULATORY_CARE_PROVIDER_SITE_OTHER): Payer: PPO | Admitting: Family Medicine

## 2017-01-14 ENCOUNTER — Encounter: Payer: Self-pay | Admitting: Family Medicine

## 2017-01-14 VITALS — BP 136/78 | HR 84 | Temp 98.1°F | Ht 72.0 in | Wt 268.4 lb

## 2017-01-14 DIAGNOSIS — E1169 Type 2 diabetes mellitus with other specified complication: Secondary | ICD-10-CM | POA: Diagnosis not present

## 2017-01-14 DIAGNOSIS — E119 Type 2 diabetes mellitus without complications: Secondary | ICD-10-CM | POA: Diagnosis not present

## 2017-01-14 DIAGNOSIS — Z23 Encounter for immunization: Secondary | ICD-10-CM

## 2017-01-14 DIAGNOSIS — G510 Bell's palsy: Secondary | ICD-10-CM

## 2017-01-14 DIAGNOSIS — E669 Obesity, unspecified: Secondary | ICD-10-CM | POA: Diagnosis not present

## 2017-01-14 MED ORDER — LANTUS SOLOSTAR 100 UNIT/ML ~~LOC~~ SOPN: 50.0000 [IU] | PEN_INJECTOR | Freq: Two times a day (BID) | SUBCUTANEOUS | Status: DC

## 2017-01-14 NOTE — Patient Instructions (Signed)
We will call about your referral. Don't change your meds for now.  I think it is a good idea to see the endocrine clinic.  Take care.  Glad to see you.

## 2017-01-14 NOTE — Progress Notes (Signed)
R Bell's palsy still annoying to patient but getting some better.  He is considering surgery to tighten his R lower eyelid.  He can speak and eat better now.    Diabetes:  Using medications without difficulties:yes Hypoglycemic episodes:no Hyperglycemic episodes: variable elevations.  Feet problems: some intermittent pain noted.   Blood Sugars averaging: up to 200s recently.  He has better control with episodic elevation until the last week or so.   A1c similar to prev.   He is working on his diet but had sig variation with power outage and travel.    PMH and SH reviewed  Meds, vitals, and allergies reviewed.   ROS: Per HPI unless specifically indicated in ROS section   GEN: nad, alert and oriented HEENT: mucous membranes moist, R lower eyelid droop noted.  NECK: supple w/o LA CV: rrr. PULM: ctab, no inc wob ABD: soft, +bs EXT: no edema SKIN: no acute rash  Diabetic foot exam: Normal inspection No skin breakdown Small plantar callus noted on L foot Normal DP pulses Normal sensation to light touch and monofilament Nails normal (foot care d/w pt)

## 2017-01-15 NOTE — Assessment & Plan Note (Signed)
He has plans to follow-up with plastic surgery about tightening his right lower eyelid. I will defer.

## 2017-01-15 NOTE — Assessment & Plan Note (Signed)
He's been diligent about checking his sugar but he is still having significant elevations. His diet has been episodically disrupted due to events beyond his control. Discussed with him about options. His A1c is 8.6.  We didn't change his meds at this point. Refer to endocrine for their input. He agrees.

## 2017-01-28 ENCOUNTER — Encounter: Payer: Self-pay | Admitting: Endocrinology

## 2017-01-28 ENCOUNTER — Ambulatory Visit (INDEPENDENT_AMBULATORY_CARE_PROVIDER_SITE_OTHER): Payer: PPO | Admitting: Endocrinology

## 2017-01-28 VITALS — BP 136/86 | HR 97 | Ht 72.0 in | Wt 263.0 lb

## 2017-01-28 DIAGNOSIS — E669 Obesity, unspecified: Secondary | ICD-10-CM | POA: Diagnosis not present

## 2017-01-28 DIAGNOSIS — E1169 Type 2 diabetes mellitus with other specified complication: Secondary | ICD-10-CM

## 2017-01-28 MED ORDER — INSULIN LISPRO 100 UNIT/ML (KWIKPEN): 15.0000 [IU] | PEN_INJECTOR | Freq: Three times a day (TID) | SUBCUTANEOUS | 11 refills | Status: DC

## 2017-01-28 NOTE — Patient Instructions (Addendum)
good diet and exercise significantly improve the control of your diabetes.  please let me know if you wish to be referred to a dietician.  high blood sugar is very risky to your health.  you should see an eye doctor and dentist every year.  It is very important to get all recommended vaccinations.  Controlling your blood pressure and cholesterol drastically reduces the damage diabetes does to your body.  Those who smoke should quit.  Please discuss these with your doctor.  check your blood sugar twice a day.  vary the time of day when you check, between before the 3 meals, and at bedtime.  also check if you have symptoms of your blood sugar being too high or too low.  please keep a record of the readings and bring it to your next appointment here (or you can bring the meter itself).  You can write it on any piece of paper.  please call us sooner if your blood sugar goes below 70, or if you have a lot of readings over 200. We will need to take this complex situation in stages For now, please: Stop taking the glipizide and metformin, and: Increase the humalog to 15-30 units 3 times a day (just before each meal), and: Please continue the same lantus. Please call or message [place] next week, to tell us how the blood sugar is doing Please come back for a follow-up appointment in 2 weeks.

## 2017-01-28 NOTE — Progress Notes (Signed)
Subjective:    Patient ID: [name], male    DOB: December 18, 1940, 76 y.o.   MRN: [id number]  HPI pt is referred by Dr Damita Dunnings, for diabetes.  Pt states DM was dx'ed in 2011; he has mild if any neuropathy of the lower extremities; he has associated renal insuff; he has been on insulin since 2017; pt says his diet is poor, but exercise is good; he has never had pancreatitis, pancreatic surgery, severe hypoglycemia or DKA.  He takes multiple daily injections and 2 oral meds.  He says cbg's vary from 90-410.  There is no trend throughout the day, except it is lowest in the afternoon.   Past Medical History:  Diagnosis Date  . Arthritis    arthirtis all over  . Bell's palsy   . Concussion    multiple- college football player  . Diabetes mellitus type 2 in obese ([place])   . Frequent urination at night   . GERD (gastroesophageal reflux disease)    occ indigestion  . Glaucoma    both eyes  . Hyperlipemia   . Hypertension   . Kidney stones   . PONV (postoperative nausea and vomiting)   . Primary localized osteoarthritis of right hip   . Primary osteoarthritis of right knee   . Restless leg syndrome   . Right knee DJD   . Spinal stenosis of lumbar region with radiculopathy    s/p L4/L5 transforaminal blocks    Past Surgical History:  Procedure Laterality Date  . ABDOMINAL HERNIA REPAIR    . ANTERIOR HIP REVISION Right 01/08/2015   Procedure: RIGHT ANTERIOR HIP REVISION;  Surgeon: Renette Butters, MD;  Location: [facility];  Service: Orthopedics;  Laterality: Right;  . BACK SURGERY    . COLONOSCOPY    . ELBOW ARTHROSCOPY  1990   right  . ELBOW SURGERY     right  . HERNIA REPAIR  1980   inguinal  . HIP CLOSED REDUCTION Right 11/09/2014   Procedure: CLOSED REDUCTION HIP, s/p total hip 8/9;  Surgeon: Ninetta Lights, MD;  Location: [facility];  Service: Orthopedics;  Laterality: Right;  . JOINT REPLACEMENT  2008   knee  . LITHOTRIPSY    . [place] SURGERY     2015  . REVISION TOTAL  HIP ARTHROPLASTY Right 01/08/2015  . SHOULDER ARTHROSCOPY  1980   right  . SHOULDER SURGERY     right  . STONE EXTRACTION WITH BASKET  2010  . TOTAL HIP ARTHROPLASTY Right 11/06/2014   Procedure: RIGHT TOTAL HIP ARTHROPLASTY ANTERIOR APPROACH;  Surgeon: Renette Butters, MD;  Location: [facility];  Service: Orthopedics;  Laterality: Right;  . TOTAL KNEE ARTHROPLASTY Left 07/2006   left knee  . TOTAL KNEE ARTHROPLASTY Right 01/08/2014   Procedure: RIGHT TOTAL KNEE ARTHROPLASTY;  Surgeon: Lorn Junes, MD;  Location: [facility];  Service: Orthopedics;  Laterality: Right;    Social History   Social History  . Marital status: Married    Spouse name: N/A  . Number of children: N/A  . Years of education: N/A   Occupational History  . Not on file.   Social History Main Topics  . Smoking status: Never Smoker  . Smokeless tobacco: Never Used  . Alcohol use Yes     Comment: glass of wine every 3-4 months  . Drug use: No  . Sexual activity: No   Other Topics Concern  . 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    1 son, 1 daughter, both out of state    Current Outpatient Prescriptions on File Prior to Visit  Medication Sig Dispense Refill  . allopurinol (ZYLOPRIM) 300 MG tablet Take 0.5 tablets (150 mg total) by mouth every morning. Take half a tablet (150mg ) by mouth daily 45 tablet 3  . amLODipine (NORVASC) 10 MG tablet Take 1 tablet (10 mg total) by mouth daily. 90 tablet 3  . aspirin 81 MG tablet Take 81 mg by mouth daily.    [name] atorvastatin (LIPITOR) 10 MG tablet Take 1 tablet (10 mg total) by mouth at bedtime. 90 tablet 3  . gemfibrozil (LOPID) 600 MG tablet Take 1 tablet (600 mg total) by mouth 2 (two) times daily. 180 tablet 1  . glucose blood (ONE TOUCH ULTRA TEST) test strip Check blood sugar before and after each meal and as directed. Dx E11.9 200 each 5  . LANTUS SOLOSTAR 100 UNIT/ML  Solostar Pen Inject 50 Units into the skin 2 (two) times daily.    [name] latanoprost (XALATAN) 0.005 % ophthalmic solution Place 1 drop into both eyes at bedtime.    [name] losartan (COZAAR) 100 MG tablet Take 1 tablet (100 mg total) by mouth daily. 90 tablet 3  . Multiple Vitamins-Minerals (VITRUM 50+ SENIOR MULTI PO) Take 1 tablet by mouth daily.      No current facility-administered medications on file prior to visit.     Allergies  Allergen Reactions  . Morphine And Related     Causes nausea & vomiting  . Codeine Sulfate Other (See Comments)    intolerant    Family History  Problem Relation Age of Onset  . Hypertension Mother   . Kidney disease Mother   . Hypertension Father   . Diabetes Mellitus II Father   . Colon polyps Father   . Diabetes Mellitus II Brother   . Diabetes Mellitus II Brother   . Cancer - Prostate Brother   . Colon cancer Neg Hx     BP 136/86   Pulse 97   Ht 6' (1.829 m)   Wt 263 lb (119.3 kg)   SpO2 96%   BMI 35.67 kg/m    Review of Systems denies weight loss, blurry vision, headache, chest pain, sob, n/v, urinary frequency, muscle cramps, excessive diaphoresis, depression, cold intolerance, rhinorrhea.  He has mild memory loss, easy bruising, and urinary frequency.     Objective:   Physical Exam VS: see vs page GEN: no distress HEAD: head: no deformity eyes: no periorbital swelling, no proptosis external nose and ears are normal mouth: no lesion seen.  NECK: supple, thyroid is not enlarged.  CHEST WALL: no deformity.  LUNGS: clear to auscultation.  CV: reg rate and rhythm, no murmur.  ABD: abdomen is soft, nontender.  no hepatosplenomegaly.  not distended.  Self-reducing ventral hernia.  MUSCULOSKELETAL: muscle bulk and strength are grossly normal.  no obvious joint swelling.  gait is normal and steady.  EXTEMITIES: no deformity.  no ulcer on the feet, but the skin is dry.  feet are of normal color and temp.  1+ bilat leg edema.  There is  bilateral onychomycosis of the toenails.  PULSES: dorsalis pedis intact bilat.  no carotid bruit NEURO:  cn 2-12 grossly intact, except for right periph VII palsy.   readily moves all 4's.  sensation is intact to touch on the feet.  SKIN:  Normal texture and temperature.  No rash or suspicious lesion is visible.   NODES:  None palpable at the neck.   PSYCH: alert, well-oriented.  Does not appear anxious nor depressed.     Lab Results  Component Value Date   HGBA1C 8.6 (H) 01/07/2017   Lab Results  Component Value Date   CREATININE 1.38 (H) 08/18/2016   BUN 27 (H) 08/18/2016   NA 137 08/18/2016   K 4.0 08/18/2016   CL 104 08/18/2016   CO2 23 08/18/2016   I have reviewed outside records, and summarized: Pt was noted to have elevated a1c, and referred here.  Pt was noted to be having ongoing probs with Bell's Palsy.  We reported good compliance with meds      Assessment & Plan:  Insulin-requiring type 2 DM: he needs increased rx Renal insuff: he prob needs little if any basal insulin.   Patient Instructions  good diet and exercise significantly improve the control of your diabetes.  please let me know if you wish to be referred to a dietician.  high blood sugar is very risky to your health.  you should see an eye doctor and dentist every year.  It is very important to get all recommended vaccinations.  Controlling your blood pressure and cholesterol drastically reduces the damage diabetes does to your body.  Those who smoke should quit.  Please discuss these with your doctor.  check your blood sugar twice a day.  vary the time of day when you check, between before the 3 meals, and at bedtime.  also check if you have symptoms of your blood sugar being too high or too low.  please keep a record of the readings and bring it to your next appointment here (or you can bring the meter itself).  You can write it on any piece of paper.  please call us sooner if your blood sugar goes below 70,  or if you have a lot of readings over 200. We will need to take this complex situation in stages For now, please: Stop taking the glipizide and metformin, and: Increase the humalog to 15-30 units 3 times a day (just before each meal), and: Please continue the same lantus. Please call or message [place] next week, to tell us how the blood sugar is doing Please come back for a follow-up appointment in 2 weeks.

## 2017-02-03 NOTE — H&P (Signed)
  Subjective:     Patient ID: [name] is a 76 y.o. male.  HPI  Referred by Dr. Katy Fitch for evaluation lower lid ectropion that developed following Bells palsy onset 07/2016 with slow improvement. Notes daily tearing on right on [place]. 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  Wears corrective lenses, has history glaucoma.  PMH includes DM, last HbA1c 8.8 (09/2016) Has f/u Dr. Phillip Heal next week of this, states hard time improving this.  Review of Systems Remainder 12 point review negative    Objective:   Physical Exam  Cardiovascular: Normal rate, regular rhythm and normal heart sounds.   Pulmonary/Chest: Effort normal and breath sounds normal.  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 Recommend with lateral canthoplasty to correct this and reduce exposure. However we reviewed his photos and that will still have some exposure from upper lid paralysis post op, would still recommend ointment at night, feel this would greatly improve symptoms but if continued consider gold weight.  Discussed incisions, sutures, bruising, OP surgery. May create asymmetry with left lower lid. Pictures taken. Patient desires to proceed. Recommend hold ASA for week prior to surgery.  Has nausea with narcotics, does ok with Tramadol.  Irene Limbo, MD [facility] Plastic & Reconstructive Surgery [phone], pin [phone]

## 2017-02-04 ENCOUNTER — Encounter (HOSPITAL_BASED_OUTPATIENT_CLINIC_OR_DEPARTMENT_OTHER)
Admission: RE | Admit: 2017-02-04 | Discharge: 2017-02-04 | Disposition: A | Discharge status: Home / Self Care | Payer: PPO | Source: Ambulatory Visit | Attending: Plastic Surgery | Admitting: Plastic Surgery

## 2017-02-04 ENCOUNTER — Other Ambulatory Visit: Payer: Self-pay

## 2017-02-04 ENCOUNTER — Encounter (HOSPITAL_BASED_OUTPATIENT_CLINIC_OR_DEPARTMENT_OTHER): Payer: Self-pay | Admitting: *Deleted

## 2017-02-04 DIAGNOSIS — E119 Type 2 diabetes mellitus without complications: Secondary | ICD-10-CM | POA: Diagnosis not present

## 2017-02-04 DIAGNOSIS — I10 Essential (primary) hypertension: Secondary | ICD-10-CM | POA: Insufficient documentation

## 2017-02-04 LAB — BASIC METABOLIC PANEL
Anion gap: 9 (ref 5–15)
BUN: 16 mg/dL (ref 6–20)
CO2: 24 mmol/L (ref 22–32)
Calcium: 9.2 mg/dL (ref 8.9–10.3)
Chloride: 102 mmol/L (ref 101–111)
Creatinine, Ser: 1.17 mg/dL (ref 0.61–1.24)
GFR calc Af Amer: >60 mL/min (ref >60)
GFR calc non Af Amer: 59 mL/min — ABNORMAL LOW (ref >60)
Glucose, Bld: 238 mg/dL — ABNORMAL HIGH (ref 65–99)
Potassium: 3.5 mmol/L (ref 3.5–5.1)
Sodium: 135 mmol/L (ref 135–145)

## 2017-02-08 ENCOUNTER — Ambulatory Visit (INDEPENDENT_AMBULATORY_CARE_PROVIDER_SITE_OTHER): Payer: PPO

## 2017-02-08 ENCOUNTER — Telehealth: Payer: Self-pay

## 2017-02-08 VITALS — BP 132/82 | HR 85 | Temp 98.5°F | Ht 71.0 in | Wt 264.5 lb

## 2017-02-08 DIAGNOSIS — Z125 Encounter for screening for malignant neoplasm of prostate: Secondary | ICD-10-CM

## 2017-02-08 DIAGNOSIS — E790 Hyperuricemia without signs of inflammatory arthritis and tophaceous disease: Secondary | ICD-10-CM

## 2017-02-08 DIAGNOSIS — E1169 Type 2 diabetes mellitus with other specified complication: Secondary | ICD-10-CM

## 2017-02-08 DIAGNOSIS — Z0000 Encounter for general adult medical examination without abnormal findings: Secondary | ICD-10-CM

## 2017-02-08 DIAGNOSIS — E669 Obesity, unspecified: Secondary | ICD-10-CM

## 2017-02-08 LAB — LIPID PANEL
Cholesterol: 181 mg/dL (ref 0–200)
HDL: 37.1 mg/dL — ABNORMAL LOW (ref >39.00)
Total CHOL/HDL Ratio: 5
Triglycerides: 461 mg/dL — ABNORMAL HIGH (ref 0.0–149.0)

## 2017-02-08 LAB — PSA, MEDICARE: PSA: 0.45 ng/ml (ref 0.10–4.00)

## 2017-02-08 LAB — URIC ACID: Uric Acid, Serum: 5.4 mg/dL (ref 4.0–7.8)

## 2017-02-08 LAB — LDL CHOLESTEROL, DIRECT: Direct LDL: 58 mg/dL

## 2017-02-08 NOTE — Patient Instructions (Addendum)
[name] , Thank you for taking time to come for your Medicare Wellness Visit. I appreciate your ongoing commitment to your health goals. Please review the following plan we discussed and let me know if I can assist you in the future.   These are the goals we discussed: Goals    Starting 02/08/2017, I will continue to exercise for 40-90 minutes 3-5 days per week.       This is a list of the screening recommended for you and due dates:  Health Maintenance  Topic Date Due  . Tetanus Vaccine  02/08/2018*  . Eye exam for diabetics  07/07/2017  . Hemoglobin A1C  07/08/2017  . Complete foot exam   01/14/2018  . Colon Cancer Screening  12/30/2018  . Flu Shot  Completed  . Pneumonia vaccines  Completed  *Topic was postponed. The date shown is not the original due date.   Preventive Care for Adults  A healthy lifestyle and preventive care can promote health and wellness. Preventive health guidelines for adults include the following key practices.  . A routine yearly physical is a good way to check with your health care provider about your health and preventive screening. It is a chance to share any concerns and updates on your health and to receive a thorough exam.  . Visit your dentist for a routine exam and preventive care every 6 months. Brush your teeth twice a day and floss once a day. Good oral hygiene prevents tooth decay and gum disease.  . The frequency of eye exams is based on your age, health, family medical history, use  of contact lenses, and other factors. Follow your health care provider's recommendations for frequency of eye exams.  . Eat a healthy diet. Foods like vegetables, fruits, whole grains, low-fat dairy products, and lean protein foods contain the nutrients you need without too many calories. Decrease your intake of foods high in solid fats, added sugars, and salt. Eat the right amount of calories for you. Get information about a proper diet from your health care  provider, if necessary.  . Regular physical exercise is one of the most important things you can do for your health. Most adults should get at least 150 minutes of moderate-intensity exercise (any activity that increases your heart rate and causes you to sweat) each week. In addition, most adults need muscle-strengthening exercises on 2 or more days a week.  Silver Sneakers may be a benefit available to you. To determine eligibility, you may visit the website: www.silversneakers.com or contact program at (808)564-7036 Mon-Fri between 8AM-8PM.   . Maintain a healthy weight. The body mass index (BMI) is a screening tool to identify possible weight problems. It provides an estimate of body fat based on height and weight. Your health care provider can find your BMI and can help you achieve or maintain a healthy weight.   For adults 20 years and older: ? A BMI below 18.5 is considered underweight. ? A BMI of 18.5 to 24.9 is normal. ? A BMI of 25 to 29.9 is considered overweight. ? A BMI of 30 and above is considered obese.   . Maintain normal blood lipids and cholesterol levels by exercising and minimizing your intake of saturated fat. Eat a balanced diet with plenty of fruit and vegetables. Blood tests for lipids and cholesterol should begin at age 49 and be repeated every 5 years. If your lipid or cholesterol levels are high, you are over 50, or you are at  high risk for heart disease, you may need your cholesterol levels checked more frequently. Ongoing high lipid and cholesterol levels should be treated with medicines if diet and exercise are not working.  . If you smoke, find out from your health care provider how to quit. If you do not use tobacco, please do not start.  . If you choose to drink alcohol, please do not consume more than 2 drinks per day. One drink is considered to be 12 ounces (355 mL) of beer, 5 ounces (148 mL) of wine, or 1.5 ounces (44 mL) of liquor.  . If you are 43-79 years  old, ask your health care provider if you should take aspirin to prevent strokes.  . Use sunscreen. Apply sunscreen liberally and repeatedly throughout the day. You should seek shade when your shadow is shorter than you. Protect yourself by wearing long sleeves, pants, a wide-brimmed hat, and sunglasses year round, whenever you are outdoors.  . Once a month, do a whole body skin exam, using a mirror to look at the skin on your back. Tell your health care provider of new moles, moles that have irregular borders, moles that are larger than a pencil eraser, or moles that have changed in shape or color.

## 2017-02-08 NOTE — Telephone Encounter (Signed)
Please review chart and advise patient when to return for another appointment.   Per your referral instructions, patient has had initial appt with Dr. Haywood Lasso and has a future appt scheduled with him on 02/12/17.  Labs completed today included: Lipid, LDL direct, PSA, and Uric acid. CBC in 5/18 and BMP in 11/18.

## 2017-02-08 NOTE — Progress Notes (Signed)
Pre visit review using our clinic review tool, if applicable. No additional management support is needed unless otherwise documented below in the visit note. 

## 2017-02-08 NOTE — Progress Notes (Signed)
Subjective:   [name] is a 76 y.o. male who presents for Medicare Annual (Subsequent) preventive examination.  Review of Systems:  N/A Cardiac Risk Factors include: advanced age (>91men, >71 women);male gender;diabetes mellitus;hypertension;obesity (BMI >30kg/m2);dyslipidemia     Objective:     Vitals: BP 132/82 (BP Location: Right Arm, Patient Position: Sitting, Cuff Size: Large)   Pulse 85   Temp 98.5 F (36.9 C) (Oral)   Ht 5\' 11"  (1.803 m) Comment: shoes  Wt 264 lb 8 oz (120 kg)   SpO2 95%   BMI 36.89 kg/m   Body mass index is 36.89 kg/m.   Tobacco Social History   Tobacco Use  Smoking Status Never Smoker  Smokeless Tobacco Never Used     Counseling given: No   Past Medical History:  Diagnosis Date  . Arthritis    arthirtis all over  . Bell's palsy   . Concussion    multiple- college football player  . Diabetes mellitus type 2 in obese ([place])   . Frequent urination at night   . GERD (gastroesophageal reflux disease)    occ indigestion  . Glaucoma    both eyes  . Hyperlipemia   . Hypertension   . Kidney stones   . PONV (postoperative nausea and vomiting)   . Primary localized osteoarthritis of right hip   . Primary osteoarthritis of right knee   . Restless leg syndrome   . Right knee DJD   . Spinal stenosis of lumbar region with radiculopathy    s/p L4/L5 transforaminal blocks   Past Surgical History:  Procedure Laterality Date  . ABDOMINAL HERNIA REPAIR    . BACK SURGERY    . COLONOSCOPY    . ELBOW ARTHROSCOPY  1990   right  . ELBOW SURGERY     right  . HERNIA REPAIR  1980   inguinal  . JOINT REPLACEMENT  2008   knee  . LITHOTRIPSY    . [place] SURGERY     2015  . REVISION TOTAL HIP ARTHROPLASTY Right 01/08/2015  . SHOULDER ARTHROSCOPY  1980   right  . SHOULDER SURGERY     right  . STONE EXTRACTION WITH BASKET  2010  . TOTAL KNEE ARTHROPLASTY Left 07/2006   left knee   Family History  Problem Relation Age of Onset    . Hypertension Mother   . Kidney disease Mother   . Hypertension Father   . Diabetes Mellitus II Father   . Colon polyps Father   . Diabetes Mellitus II Brother   . Diabetes Mellitus II Brother   . Cancer - Prostate Brother   . Colon cancer Neg Hx    Social History   Substance and Sexual Activity  Sexual Activity No    Outpatient Encounter Medications as of 02/08/2017  Medication Sig  . allopurinol (ZYLOPRIM) 300 MG tablet Take 0.5 tablets (150 mg total) by mouth every morning. Take half a tablet (150mg ) by mouth daily  . amLODipine (NORVASC) 10 MG tablet Take 1 tablet (10 mg total) by mouth daily.  [name] aspirin 81 MG tablet Take 81 mg by mouth daily.  [name] atorvastatin (LIPITOR) 10 MG tablet Take 1 tablet (10 mg total) by mouth at bedtime.  [name] gemfibrozil (LOPID) 600 MG tablet Take 1 tablet (600 mg total) by mouth 2 (two) times daily.  [name] glucose blood (ONE TOUCH ULTRA TEST) test strip Check blood sugar before and after each meal and as directed. Dx E11.9  . insulin  lispro (HUMALOG KWIKPEN) 100 UNIT/ML KiwkPen Inject 0.15-0.3 mLs (15-30 Units total) into the skin 3 (three) times daily with meals. and pen needles 5/day  . LANTUS SOLOSTAR 100 UNIT/ML Solostar Pen Inject 50 Units into the skin 2 (two) times daily.  [name] latanoprost (XALATAN) 0.005 % ophthalmic solution Place 1 drop into both eyes at bedtime.  [name] losartan (COZAAR) 100 MG tablet Take 1 tablet (100 mg total) by mouth daily.  . Multiple Vitamins-Minerals (VITRUM 50+ SENIOR MULTI PO) Take 1 tablet by mouth daily.    No facility-administered encounter medications on file as of 02/08/2017.     Activities of Daily Living In your present state of health, do you have any difficulty performing the following activities: 02/08/2017  Hearing? N  Vision? Y  Difficulty concentrating or making decisions? N  Walking or climbing stairs? N  Dressing or bathing? N  Doing errands, shopping? N  Preparing Food and eating ? N  Using the Toilet?  N  In the past six months, have you accidently leaked urine? Y  Do you have problems with loss of bowel control? N  Managing your Medications? N  Managing your Finances? N  Housekeeping or managing your Housekeeping? N  Some recent data might be hidden    Patient Care Team: Tonia Ghent, MD as PCP - General (Family Medicine) Clent Jacks, MD as Consulting Physician (Ophthalmology) Renato Shin, MD as Consulting Physician (Endocrinology) Armbruster, Carlota Raspberry, MD as Consulting Physician (Gastroenterology) Irene Limbo, MD as Consulting Physician (Plastic Surgery) Garvin Fila, MD as Consulting Physician (Neurology)    Assessment:     Hearing Screening   125Hz  250Hz  500Hz  1000Hz  2000Hz  3000Hz  4000Hz  6000Hz  8000Hz   Right ear:   40 40 40  0    Left ear:   40 40 40  0    Vision Screening Comments: Last vision exam in April 2018 with Dr. Katy Fitch; vision exams every 6 mths for glaucoma   Exercise Activities and Dietary recommendations Current Exercise Habits: Home exercise routine, Type of exercise: strength training/weights;Other - see comments(stationary bike), Time (Minutes): 60, Frequency (Times/Week): 5, Weekly Exercise (Minutes/Week): 300, Intensity: Moderate, Exercise limited by: None identified  Goals    Starting 02/08/2017, I will continue to exercise for 40-90 minutes 3-5 days per week.      Fall Risk Fall Risk  02/08/2017 01/14/2017 10/14/2015  Falls in the past year? Yes No Yes  Comment patient fell after taking Codeine cough syrup - -  Number falls in past yr: 1 - 1  Injury with Fall? No - No  Follow up - - Falls evaluation completed   Depression Screen PHQ 2/9 Scores 02/08/2017 01/14/2017 10/14/2015 02/11/2015  PHQ - 2 Score 1 0 0 0  PHQ- 9 Score 2 - - -     Cognitive Function MMSE - Mini Mental State Exam 02/08/2017 10/14/2015  Orientation to time 5 5  Orientation to Place 5 5  Registration 3 3  Attention/ Calculation 0 0  Recall 3 3  Language-  name 2 objects 0 0  Language- repeat 1 1  Language- follow 3 step command 3 3  Language- read & follow direction 0 0  Write a sentence 0 0  Copy design 0 0  Total score 20 20     PLEASE NOTE: A Mini-Cog screen was completed. Maximum score is 20. A value of 0 denotes this part of Folstein MMSE was not completed or the patient failed this part of the Mini-Cog screening.  Mini-Cog Screening Orientation to Time - Max 5 pts Orientation to Place - Max 5 pts Registration - Max 3 pts Recall - Max 3 pts Language Repeat - Max 1 pts Language Follow 3 Step Command - Max 3 pts     Immunization History  Administered Date(s) Administered  . Influenza, High Dose Seasonal PF 01/14/2017  . Influenza,inj,Quad PF,6+ Mos 02/17/2016  . Influenza-Unspecified 03/11/2015  . Pneumococcal Conjugate-13 03/26/2014  . Pneumococcal Polysaccharide-23 02/07/2010  . Tdap 03/26/2006  . Zoster 04/01/2009   Screening Tests Health Maintenance  Topic Date Due  . TETANUS/TDAP  02/08/2018 (Originally 03/26/2016)  . OPHTHALMOLOGY EXAM  07/07/2017  . HEMOGLOBIN A1C  07/08/2017  . FOOT EXAM  01/14/2018  . COLONOSCOPY  12/30/2018  . INFLUENZA VACCINE  Completed  . PNA vac Low Risk Adult  Completed      Plan:   I have personally reviewed, addressed, and noted the following in the patient's chart:  A. Medical and social history B. Use of alcohol, tobacco or illicit drugs  C. Current medications and supplements D. Functional ability and status E.  Nutritional status F.  Physical activity G. Advance directives H. List of other physicians I.  Hospitalizations, surgeries, and ER visits in previous 12 months J.  [place] to include hearing, vision, cognitive, depression L. Referrals and appointments - none  In addition, I have reviewed and discussed with patient certain preventive protocols, quality metrics, and best practice recommendations. A written personalized care plan for preventive  services as well as general preventive health recommendations were provided to patient.  See attached scanned questionnaire for additional information.   Signed,   Lindell Noe, MHA, BS, LPN Health Coach

## 2017-02-08 NOTE — Progress Notes (Signed)
PCP notes:   Health maintenance:  Tetanus - postponed/insurance  Abnormal screenings:   Depression score: 2  Fall risk - hx of fall without injury  Hearing - failed  Hearing Screening   125Hz  250Hz  500Hz  1000Hz  2000Hz  3000Hz  4000Hz  6000Hz  8000Hz   Right ear:   40 40 40  0    Left ear:   40 40 40  0     Patient concerns:   None  Nurse concerns:  None  Next PCP appt:   To be determined by PCP  I reviewed health advisor's note, was available for consultation on the day of service listed in this note, and agree with documentation and plan. Elsie Stain, MD.

## 2017-02-09 ENCOUNTER — Encounter (HOSPITAL_BASED_OUTPATIENT_CLINIC_OR_DEPARTMENT_OTHER): Payer: Self-pay | Admitting: Anesthesiology

## 2017-02-09 ENCOUNTER — Ambulatory Visit (HOSPITAL_BASED_OUTPATIENT_CLINIC_OR_DEPARTMENT_OTHER): Payer: PPO | Admitting: Anesthesiology

## 2017-02-09 ENCOUNTER — Other Ambulatory Visit: Payer: Self-pay

## 2017-02-09 ENCOUNTER — Ambulatory Visit (HOSPITAL_BASED_OUTPATIENT_CLINIC_OR_DEPARTMENT_OTHER)
Admission: RE | Admit: 2017-02-09 | Discharge: 2017-02-09 | Disposition: A | Discharge status: Home / Self Care | Payer: PPO | Source: Ambulatory Visit | Attending: Plastic Surgery | Admitting: Plastic Surgery

## 2017-02-09 ENCOUNTER — Encounter (HOSPITAL_BASED_OUTPATIENT_CLINIC_OR_DEPARTMENT_OTHER)
Admission: RE | Disposition: A | Discharge status: Home / Self Care | Payer: Self-pay | Source: Ambulatory Visit | Attending: Plastic Surgery

## 2017-02-09 DIAGNOSIS — E119 Type 2 diabetes mellitus without complications: Secondary | ICD-10-CM | POA: Diagnosis not present

## 2017-02-09 DIAGNOSIS — I10 Essential (primary) hypertension: Secondary | ICD-10-CM | POA: Insufficient documentation

## 2017-02-09 DIAGNOSIS — G839 Paralytic syndrome, unspecified: Secondary | ICD-10-CM | POA: Insufficient documentation

## 2017-02-09 DIAGNOSIS — H189 Unspecified disorder of cornea: Secondary | ICD-10-CM | POA: Insufficient documentation

## 2017-02-09 DIAGNOSIS — E669 Obesity, unspecified: Secondary | ICD-10-CM | POA: Diagnosis not present

## 2017-02-09 DIAGNOSIS — K219 Gastro-esophageal reflux disease without esophagitis: Secondary | ICD-10-CM | POA: Diagnosis not present

## 2017-02-09 DIAGNOSIS — G510 Bell's palsy: Secondary | ICD-10-CM | POA: Insufficient documentation

## 2017-02-09 DIAGNOSIS — H02102 Unspecified ectropion of right lower eyelid: Secondary | ICD-10-CM | POA: Insufficient documentation

## 2017-02-09 DIAGNOSIS — Z794 Long term (current) use of insulin: Secondary | ICD-10-CM | POA: Insufficient documentation

## 2017-02-09 DIAGNOSIS — Z79899 Other long term (current) drug therapy: Secondary | ICD-10-CM | POA: Insufficient documentation

## 2017-02-09 DIAGNOSIS — H02159 Paralytic ectropion of unspecified eye, unspecified eyelid: Secondary | ICD-10-CM | POA: Insufficient documentation

## 2017-02-09 DIAGNOSIS — M1990 Unspecified osteoarthritis, unspecified site: Secondary | ICD-10-CM | POA: Insufficient documentation

## 2017-02-09 HISTORY — PX: CANTHOPLASTY: SHX1286

## 2017-02-09 LAB — GLUCOSE, CAPILLARY
Glucose-Capillary: 284 mg/dL — ABNORMAL HIGH (ref 65–99)
Glucose-Capillary: 244 mg/dL — ABNORMAL HIGH (ref 65–99)

## 2017-02-09 SURGERY — CANTHOPLASTY
Anesthesia: General | Site: Eye | Laterality: Right

## 2017-02-09 MED ORDER — DEXAMETHASONE SODIUM PHOSPHATE 4 MG/ML IJ SOLN
INTRAMUSCULAR | Status: DC | PRN
Start: 2017-02-09 — End: 2017-02-09
  Administered 2017-02-09: 10 mg via INTRAVENOUS

## 2017-02-09 MED ORDER — MIDAZOLAM HCL 2 MG/2ML IJ SOLN: 1.0000 mg | INTRAMUSCULAR | Status: DC | PRN

## 2017-02-09 MED ORDER — HYDROCODONE-ACETAMINOPHEN 7.5-325 MG PO TABS
1.0000 | ORAL_TABLET | Freq: Once | ORAL | Status: DC | PRN
Start: 2017-02-09 — End: 2017-02-09

## 2017-02-09 MED ORDER — ONDANSETRON HCL 4 MG/2ML IJ SOLN
INTRAMUSCULAR | Status: AC
  Filled 2017-02-09: qty 2

## 2017-02-09 MED ORDER — MEPERIDINE HCL 25 MG/ML IJ SOLN: 6.2500 mg | INTRAMUSCULAR | Status: DC | PRN

## 2017-02-09 MED ORDER — BSS IO SOLN
INTRAOCULAR | Status: AC
  Filled 2017-02-09: qty 15

## 2017-02-09 MED ORDER — LIDOCAINE HCL (CARDIAC) 20 MG/ML IV SOLN
INTRAVENOUS | Status: DC | PRN
  Administered 2017-02-09: 30 mg via INTRAVENOUS

## 2017-02-09 MED ORDER — LIDOCAINE-EPINEPHRINE 1 %-1:100000 IJ SOLN
INTRAMUSCULAR | Status: DC | PRN
  Administered 2017-02-09: 2.5 mL

## 2017-02-09 MED ORDER — DEXAMETHASONE SODIUM PHOSPHATE 10 MG/ML IJ SOLN
INTRAMUSCULAR | Status: AC
  Filled 2017-02-09: qty 1

## 2017-02-09 MED ORDER — FENTANYL CITRATE (PF) 100 MCG/2ML IJ SOLN
25.0000 ug | INTRAMUSCULAR | Status: DC | PRN
  Administered 2017-02-09: 50 ug via INTRAVENOUS
  Administered 2017-02-09: 25 ug via INTRAVENOUS

## 2017-02-09 MED ORDER — CEFAZOLIN SODIUM-DEXTROSE 2-4 GM/100ML-% IV SOLN
INTRAVENOUS | Status: AC
  Filled 2017-02-09: qty 100

## 2017-02-09 MED ORDER — CEFAZOLIN SODIUM-DEXTROSE 2-4 GM/100ML-% IV SOLN
2.0000 g | INTRAVENOUS | Status: AC
  Administered 2017-02-09: 2 g via INTRAVENOUS

## 2017-02-09 MED ORDER — PROPOFOL 10 MG/ML IV BOLUS
INTRAVENOUS | Status: DC | PRN
  Administered 2017-02-09 (×2): 50 mg via INTRAVENOUS
  Administered 2017-02-09: 250 mg via INTRAVENOUS

## 2017-02-09 MED ORDER — TOBRAMYCIN-DEXAMETHASONE 0.3-0.1 % OP OINT
TOPICAL_OINTMENT | OPHTHALMIC | Status: DC | PRN
  Administered 2017-02-09: 1 via OPHTHALMIC

## 2017-02-09 MED ORDER — TOBRAMYCIN-DEXAMETHASONE 0.3-0.1 % OP OINT
TOPICAL_OINTMENT | OPHTHALMIC | Status: AC
  Filled 2017-02-09: qty 3.5

## 2017-02-09 MED ORDER — LACTATED RINGERS IV SOLN
INTRAVENOUS | Status: DC
  Administered 2017-02-09 (×2): via INTRAVENOUS

## 2017-02-09 MED ORDER — FENTANYL CITRATE (PF) 100 MCG/2ML IJ SOLN
INTRAMUSCULAR | Status: AC
  Filled 2017-02-09: qty 2

## 2017-02-09 MED ORDER — FENTANYL CITRATE (PF) 100 MCG/2ML IJ SOLN: 50.0000 ug | INTRAMUSCULAR | Status: DC | PRN

## 2017-02-09 MED ORDER — ARTIFICIAL TEARS OPHTHALMIC OINT
TOPICAL_OINTMENT | OPHTHALMIC | Status: AC
  Filled 2017-02-09: qty 3.5

## 2017-02-09 MED ORDER — FENTANYL CITRATE (PF) 100 MCG/2ML IJ SOLN
INTRAMUSCULAR | Status: DC | PRN
  Administered 2017-02-09 (×2): 50 ug via INTRAVENOUS

## 2017-02-09 MED ORDER — PROMETHAZINE HCL 25 MG/ML IJ SOLN: 6.2500 mg | INTRAMUSCULAR | Status: DC | PRN

## 2017-02-09 MED ORDER — LIDOCAINE-EPINEPHRINE 1 %-1:100000 IJ SOLN
INTRAMUSCULAR | Status: AC
  Filled 2017-02-09: qty 1

## 2017-02-09 MED ORDER — SCOPOLAMINE 1 MG/3DAYS TD PT72: 1.0000 | MEDICATED_PATCH | Freq: Once | TRANSDERMAL | Status: DC | PRN

## 2017-02-09 MED ORDER — LIDOCAINE 2% (20 MG/ML) 5 ML SYRINGE
INTRAMUSCULAR | Status: AC
  Filled 2017-02-09: qty 5

## 2017-02-09 MED ORDER — HYDROCODONE-ACETAMINOPHEN 5-325 MG PO TABS: 1.0000 | ORAL_TABLET | ORAL | 0 refills | Status: DC | PRN

## 2017-02-09 MED ORDER — PROPOFOL 10 MG/ML IV BOLUS
INTRAVENOUS | Status: AC
  Filled 2017-02-09: qty 20

## 2017-02-09 SURGICAL SUPPLY — 27 items
BLADE SURG 15 STRL LF DISP TIS (BLADE) ×1 IMPLANT
BLADE SURG 15 STRL SS (BLADE) ×1
COVER BACK TABLE 60X90IN (DRAPES) ×2 IMPLANT
COVER MAYO STAND STRL (DRAPES) ×2 IMPLANT
DRAPE U-SHAPE 76X120 STRL (DRAPES) ×2 IMPLANT
ELECT NEEDLE BLADE 2-5/6 (NEEDLE) ×2 IMPLANT
ELECT REM PT RETURN 9FT ADLT (ELECTROSURGICAL) ×2
ELECTRODE REM PT RTRN 9FT ADLT (ELECTROSURGICAL) ×1 IMPLANT
GLOVE BIO SURGEON STRL SZ 6 (GLOVE) ×2 IMPLANT
GOWN STRL REUS W/ TWL LRG LVL3 (GOWN DISPOSABLE) ×2 IMPLANT
GOWN STRL REUS W/TWL LRG LVL3 (GOWN DISPOSABLE) ×2
NEEDLE HYPO 30GX1 BEV (NEEDLE) ×2 IMPLANT
PACK BASIN DAY SURGERY FS (CUSTOM PROCEDURE TRAY) ×2 IMPLANT
PENCIL BUTTON HOLSTER BLD 10FT (ELECTRODE) ×2 IMPLANT
SHEILD EYE MED CORNL SHD 22X21 (OPHTHALMIC RELATED) ×2
SHIELD EYE MED CORNL SHD 22X21 (OPHTHALMIC RELATED) ×1 IMPLANT
SLEEVE SCD COMPRESS KNEE MED (MISCELLANEOUS) ×2 IMPLANT
STAPLER VISISTAT 35W (STAPLE) ×2 IMPLANT
SUT MERSILENE 5 0 P 3 (SUTURE) ×2 IMPLANT
SUT MERSILENE 5-0 (SUTURE) IMPLANT
SUT PLAIN 5 0 P 3 18 (SUTURE) ×2 IMPLANT
SUT PROLENE 6 0 P 1 18 (SUTURE) IMPLANT
SUT VIC AB 5-0 P-3 18X BRD (SUTURE) ×1 IMPLANT
SUT VIC AB 5-0 P3 18 (SUTURE) ×1
SYR CONTROL 10ML LL (SYRINGE) ×2 IMPLANT
TOWEL OR 17X24 6PK STRL BLUE (TOWEL DISPOSABLE) ×2 IMPLANT
TRAY DSU PREP LF (CUSTOM PROCEDURE TRAY) ×2 IMPLANT

## 2017-02-09 NOTE — Transfer of Care (Signed)
Immediate Anesthesia Transfer of Care Note  Patient: [name]  Procedure(s) Performed: RIGHT LATERAL CANTHOPLASTY (Right Eye)  Patient Location: PACU  Anesthesia Type:General  Level of Consciousness: awake  Airway & Oxygen Therapy: Patient Spontanous Breathing  Post-op Assessment: Report given to RN and Post -op Vital signs reviewed and stable  Post vital signs: Reviewed and stable  Last Vitals:  Vitals:   02/09/17 0957  BP: (!) 141/87  Pulse: 87  Resp: 18  Temp: 36.8 C  SpO2: 96%    Last Pain:  Vitals:   02/09/17 0957  TempSrc: Oral  PainSc: 0-No pain         Complications: No apparent anesthesia complications

## 2017-02-09 NOTE — Telephone Encounter (Signed)
See result note.  Thanks!

## 2017-02-09 NOTE — Anesthesia Postprocedure Evaluation (Signed)
Anesthesia Post Note  Patient: [name]  Procedure(s) Performed: RIGHT LATERAL CANTHOPLASTY (Right Eye)     Patient location during evaluation: PACU Anesthesia Type: General Level of consciousness: sedated and patient cooperative Pain management: pain level controlled Vital Signs Assessment: post-procedure vital signs reviewed and stable Respiratory status: spontaneous breathing Cardiovascular status: stable Anesthetic complications: no    Last Vitals:  Vitals:   02/09/17 1230 02/09/17 1308  BP: (!) 152/76 (!) 154/71  Pulse:  79  Resp:  16  Temp:  37.1 C  SpO2:  94%    Last Pain:  Vitals:   02/09/17 1308  TempSrc:   PainSc: 1                  Nolon Nations

## 2017-02-09 NOTE — Telephone Encounter (Signed)
Left detailed message on voicemail. DPR 

## 2017-02-09 NOTE — Anesthesia Preprocedure Evaluation (Signed)
Anesthesia Evaluation  Patient identified by MRN, date of birth, ID band Patient awake    Reviewed: Allergy & Precautions, H&P , NPO status , Patient's Chart, lab work & pertinent test results  History of Anesthesia Complications (+) PONV  Airway Mallampati: II  TM Distance: >3 FB Neck ROM: Full    Dental no notable dental hx. (+) Teeth Intact, Dental Advisory Given   Pulmonary neg pulmonary ROS,    Pulmonary exam normal breath sounds clear to auscultation       Cardiovascular hypertension, Pt. on medications  Rhythm:Regular Rate:Normal     Neuro/Psych negative neurological ROS  negative psych ROS   GI/Hepatic Neg liver ROS, GERD  Controlled,  Endo/Other  diabetes, Type 2, Insulin Dependent, Oral Hypoglycemic Agents  Renal/GU Renal InsufficiencyRenal disease  negative genitourinary   Musculoskeletal  (+) Arthritis , Osteoarthritis,    Abdominal (+) + obese,   Peds  Hematology negative hematology ROS (+)   Anesthesia Other Findings   Reproductive/Obstetrics negative OB ROS                             Anesthesia Physical  Anesthesia Plan  ASA: III  Anesthesia Plan: General   Post-op Pain Management:    Induction: Intravenous  PONV Risk Score and Plan: 3 and Treatment may vary due to age or medical condition, Ondansetron and Dexamethasone  Airway Management Planned: LMA  Additional Equipment:   Intra-op Plan:   Post-operative Plan: Extubation in OR  Informed Consent: I have reviewed the patients History and Physical, chart, labs and discussed the procedure including the risks, benefits and alternatives for the proposed anesthesia with the patient or authorized representative who has indicated his/her understanding and acceptance.   Dental advisory given  Plan Discussed with: CRNA  Anesthesia Plan Comments:        Anesthesia Quick Evaluation

## 2017-02-09 NOTE — Anesthesia Procedure Notes (Signed)
Procedure Name: LMA Insertion Date/Time: 02/09/2017 10:44 AM Performed by: Marrianne Mood, CRNA Pre-anesthesia Checklist: Patient identified, Emergency Drugs available, Suction available, Patient being monitored and Timeout performed Patient Re-evaluated:Patient Re-evaluated prior to induction Oxygen Delivery Method: Circle system utilized Preoxygenation: Pre-oxygenation with 100% oxygen Induction Type: IV induction Ventilation: Mask ventilation without difficulty LMA: LMA inserted LMA Size: 5.0 Number of attempts: 1 Airway Equipment and Method: Bite block Placement Confirmation: positive ETCO2 Tube secured with: Tape Dental Injury: Teeth and Oropharynx as per pre-operative assessment

## 2017-02-09 NOTE — Op Note (Signed)
Operative Note   DATE OF OPERATION: 11.13.18  LOCATION: [facility]-outpatient  SURGICAL DIVISION: Plastic Surgery  PREOPERATIVE DIAGNOSES:  1. Ectropion right lower lid paralytic 2.Bells palsy  POSTOPERATIVE DIAGNOSES:  same  PROCEDURE:  Right lateral canthoplasty  SURGEON: Irene Limbo MD MBA  ASSISTANT: none  ANESTHESIA:  General.   EBL: 10 ml  COMPLICATIONS: None immediate.   INDICATIONS FOR PROCEDURE:  The patient, [name], is a 76 y.o. male born on 07-Nov-1940, is here for treatment ectropion right lower eyelid in setting of Bells palsy.   FINDINGS: Following canthoplasty, able to distract lower eyelid 2 mm from globe.  DESCRIPTION OF PROCEDURE:  The patient's operative site was marked with the patient in the preoperative area. The patient was taken to the operating room. SCDs were placed and IV antibiotics were given. Ophthalmic lubricant placed in right eye followed by scleral shield. The patient's operative site was prepped and draped in a sterile fashion. A time out was performed and all information was confirmed to be correct.  Local anesthetic infiltrated to perform right supra and infraorbital nerve block. Additional local anesthetic infiltrated over right lateral canthus. Incision made over right lateral orbital wall in natural skin tension line. Orbicularis oculi muscle divided in direction of fibers and dissection carried to periosteum of lateral orbital rim. The inferior limb of lateral canthal tendon was identified and divided. 4-0 mersilene suture placed though canthal tendon and this was secured to periosteum of lateral orbital rim superior to level of pupil. Incision closed with 5-0 plain gut suture. 4-0 silk used to place suture of skin muscle flap of lower lid and secured to periosteum bone. Scleral shield removed and eye irrigated with basic salt solution. Tobradex ophthalmic ointment placed in right eye.  The patient was allowed to wake from  anesthesia, extubated and taken to the recovery room in satisfactory condition.   SPECIMENS: none  DRAINS: none  Irene Limbo, MD [facility] Plastic & Reconstructive Surgery [phone], pin [phone]

## 2017-02-09 NOTE — Discharge Instructions (Signed)

## 2017-02-09 NOTE — Interval H&P Note (Signed)
History and Physical Interval Note:  02/09/2017 9:55 AM  [name]  has presented today for surgery, with the diagnosis of RIGHT LOWER LID ECTROPION PARALYPIC  The various methods of treatment have been discussed with the patient and family. After consideration of risks, benefits and other options for treatment, the patient has consented to  Procedure(s): RIGHT LATERAL CANTHOPLASTY (Right) as a surgical intervention .  The patient's history has been reviewed, patient examined, no change in status, stable for surgery.  I have reviewed the patient's chart and labs.  Questions were answered to the patient's satisfaction.     Tramel Westbrook

## 2017-02-10 ENCOUNTER — Encounter (HOSPITAL_BASED_OUTPATIENT_CLINIC_OR_DEPARTMENT_OTHER): Payer: Self-pay | Admitting: Plastic Surgery

## 2017-02-12 ENCOUNTER — Encounter: Payer: Self-pay | Admitting: Endocrinology

## 2017-02-12 ENCOUNTER — Other Ambulatory Visit: Payer: Self-pay

## 2017-02-12 ENCOUNTER — Ambulatory Visit: Payer: PPO | Admitting: Endocrinology

## 2017-02-12 VITALS — BP 152/92 | HR 80 | Wt 270.6 lb

## 2017-02-12 DIAGNOSIS — E669 Obesity, unspecified: Secondary | ICD-10-CM

## 2017-02-12 DIAGNOSIS — E1169 Type 2 diabetes mellitus with other specified complication: Secondary | ICD-10-CM | POA: Diagnosis not present

## 2017-02-12 MED ORDER — GLUCOSE BLOOD VI STRP: ORAL_STRIP | 5 refills | Status: DC

## 2017-02-12 MED ORDER — INSULIN ASPART 100 UNIT/ML FLEXPEN: 35.0000 [IU] | PEN_INJECTOR | Freq: Three times a day (TID) | SUBCUTANEOUS | 11 refills | Status: DC

## 2017-02-12 MED ORDER — ONETOUCH ULTRA 2 W/DEVICE KIT: 1.0000 | PACK | Freq: Every day | 0 refills | Status: DC

## 2017-02-12 NOTE — Patient Instructions (Addendum)
check your blood sugar twice a day.  vary the time of day when you check, between before the 3 meals, and at bedtime.  also check if you have symptoms of your blood sugar being too high or too low.  please keep a record of the readings and bring it to your next appointment here (or you can bring the meter itself).  You can write it on any piece of paper.  please call us sooner if your blood sugar goes below 70, or if you have a lot of readings over 200. We will need to take this complex situation in stages.  For now, please:  Increase the humalog to 35-40 units 3 times a day (just before each meal), and: Please continue the same lantus.  Please come back for a follow-up appointment in 2 weeks.

## 2017-02-12 NOTE — Progress Notes (Signed)
Subjective:    Patient ID: [name], male    DOB: [birth year], 76 y.o.   MRN: [id number]  HPI Pt returns for f/u of diabetes mellitus: DM type: Insulin-requiring type 2.   Dx'ed: 6160 Complications: renal insuff Therapy: insulin since 2017, and 2 oral meds DKA: never Severe hypoglycemia: never Pancreatitis: never Pancreatic imaging: normal in 2014 [place] Other: he tales multiple daily injections.  Interval history: no cbg record, but states cbg's vary from 230-340.  There is no trend throughout the day.  pt states he feels well in general. Past Medical History:  Diagnosis Date  . Arthritis    arthirtis all over  . Bell's palsy   . Concussion    multiple- college football player  . Diabetes mellitus type 2 in obese ([place])   . Frequent urination at night   . GERD (gastroesophageal reflux disease)    occ indigestion  . Glaucoma    both eyes  . Hyperlipemia   . Hypertension   . Kidney stones   . PONV (postoperative nausea and vomiting)   . Primary localized osteoarthritis of right hip   . Primary osteoarthritis of right knee   . Restless leg syndrome   . Right knee DJD   . Spinal stenosis of lumbar region with radiculopathy    s/p L4/L5 transforaminal blocks    Past Surgical History:  Procedure Laterality Date  . ABDOMINAL HERNIA REPAIR    . BACK SURGERY    . CLOSED REDUCTION HIP, s/p total hip 8/9 Right 11/09/2014   Performed by Ninetta Lights, MD at [facility]    . ELBOW ARTHROSCOPY  1990   right  . ELBOW SURGERY     right  . HERNIA REPAIR  1980   inguinal  . JOINT REPLACEMENT  2008   knee  . LITHOTRIPSY    . [place] SURGERY     2015  . POSTERIOR LUMBAR FUSION 1 LEVEL N/A 04/11/2013   Performed by Kristeen Miss, MD at [facility] NEURO ORS  . REVISION TOTAL HIP ARTHROPLASTY Right 01/08/2015  . RIGHT ANTERIOR HIP REVISION Right 01/08/2015   Performed by Renette Butters, MD at [place] Right 02/09/2017   Performed by  Irene Limbo, MD at [facility]  . RIGHT TOTAL HIP ARTHROPLASTY ANTERIOR APPROACH Right 11/06/2014   Performed by Renette Butters, MD at [place] Right 01/08/2014   Performed by Lorn Junes, MD at [place]   right  . SHOULDER SURGERY     right  . STONE EXTRACTION WITH BASKET  2010  . TOTAL KNEE ARTHROPLASTY Left 07/2006   left knee    Social History   Socioeconomic History  . Marital status: Married    Spouse name: Not on file  . Number of children: Not on file  . Years of education: Not on file  . Highest education level: Not on file  Social Needs  . Financial resource strain: Not on file  . Food insecurity - worry: Not on file  . Food insecurity - inability: Not on file  . Transportation needs - medical: Not on file  . Transportation needs - non-medical: Not on file  Occupational History  . Not on file  Tobacco Use  . Smoking status: Never Smoker  . Smokeless tobacco: Never Used  Substance and Sexual Activity  . Alcohol use:  Yes    Comment: glass of wine every 3-4 months  . Drug use: No  . Sexual activity: No  Other Topics Concern  . 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    1 son, 1 daughter, both out of state    Current Outpatient Medications on File Prior to Visit  Medication Sig Dispense Refill  . allopurinol (ZYLOPRIM) 300 MG tablet Take 0.5 tablets (150 mg total) by mouth every morning. Take half a tablet (150mg ) by mouth daily 45 tablet 3  . amLODipine (NORVASC) 10 MG tablet Take 1 tablet (10 mg total) by mouth daily. 90 tablet 3  . aspirin 81 MG tablet Take 81 mg by mouth daily.    [name] atorvastatin (LIPITOR) 10 MG tablet Take 1 tablet (10 mg total) by mouth at bedtime. 90 tablet 3  . gemfibrozil (LOPID) 600 MG tablet Take 1 tablet (600 mg total) by mouth 2 (two) times daily. 180 tablet 1   . HYDROcodone-acetaminophen (NORCO/VICODIN) 5-325 MG tablet Take 1 tablet every 4 (four) hours as needed by mouth for moderate pain. 5 tablet 0  . LANTUS SOLOSTAR 100 UNIT/ML Solostar Pen Inject 50 Units into the skin 2 (two) times daily.    [name] latanoprost (XALATAN) 0.005 % ophthalmic solution Place 1 drop into both eyes at bedtime.    [name] losartan (COZAAR) 100 MG tablet Take 1 tablet (100 mg total) by mouth daily. 90 tablet 3  . Multiple Vitamins-Minerals (VITRUM 50+ SENIOR MULTI PO) Take 1 tablet by mouth daily.      No current facility-administered medications on file prior to visit.     Allergies  Allergen Reactions  . Morphine And Related     Causes nausea & vomiting  . Codeine Sulfate Other (See Comments)    intolerant    Family History  Problem Relation Age of Onset  . Hypertension Mother   . Kidney disease Mother   . Hypertension Father   . Diabetes Mellitus II Father   . Colon polyps Father   . Diabetes Mellitus II Brother   . Diabetes Mellitus II Brother   . Cancer - Prostate Brother   . Colon cancer Neg Hx     BP (!) 152/92 (BP Location: Right Arm, Patient Position: Sitting, Cuff Size: Normal)   Pulse 80   Wt 270 lb 9.6 oz (122.7 kg)   SpO2 96%   BMI 36.70 kg/m   Review of Systems He denies hypoglycemia.      Objective:   Physical Exam VITAL SIGNS:  See vs page GENERAL: no distress Pulses: foot pulses are intact bilaterally.   MSK: no deformity of the feet or ankles.  CV: 1+ bilat edema of the legs Skin:  no ulcer on the feet or ankles.  normal color and temp on the feet and ankles Neuro: sensation is intact to touch on the feet and ankles.   Ext: There is bilateral onychomycosis of the toenails   Lab Results  Component Value Date   HGBA1C 8.6 (H) 01/07/2017      Assessment & Plan:  Insulin-requiring type 2 DM: worse.   Renal insuff: in this setting, the mealtime insulin is chosen for increase.    Patient Instructions  check your blood sugar  twice a day.  vary the time of day when you check, between before the 3 meals, and at bedtime.  also check  if you have symptoms of your blood sugar being too high or too low.  please keep a record of the readings and bring it to your next appointment here (or you can bring the meter itself).  You can write it on any piece of paper.  please call us sooner if your blood sugar goes below 70, or if you have a lot of readings over 200. We will need to take this complex situation in stages.  For now, please:  Increase the humalog to 35-40 units 3 times a day (just before each meal), and: Please continue the same lantus.  Please come back for a follow-up appointment in 2 weeks.

## 2017-02-24 DIAGNOSIS — M25551 Pain in right hip: Secondary | ICD-10-CM | POA: Diagnosis not present

## 2017-02-25 ENCOUNTER — Encounter: Payer: Self-pay | Admitting: Endocrinology

## 2017-02-25 ENCOUNTER — Ambulatory Visit (INDEPENDENT_AMBULATORY_CARE_PROVIDER_SITE_OTHER): Payer: PPO | Admitting: Endocrinology

## 2017-02-25 VITALS — BP 142/88 | HR 87 | Wt 277.0 lb

## 2017-02-25 DIAGNOSIS — E669 Obesity, unspecified: Secondary | ICD-10-CM | POA: Diagnosis not present

## 2017-02-25 DIAGNOSIS — E1169 Type 2 diabetes mellitus with other specified complication: Secondary | ICD-10-CM | POA: Diagnosis not present

## 2017-02-25 MED ORDER — INSULIN ASPART 100 UNIT/ML FLEXPEN: 50.0000 [IU] | PEN_INJECTOR | Freq: Three times a day (TID) | SUBCUTANEOUS | 11 refills | Status: DC

## 2017-02-25 NOTE — Patient Instructions (Addendum)
check your blood sugar twice a day.  vary the time of day when you check, between before the 3 meals, and at bedtime.  also check if you have symptoms of your blood sugar being too high or too low.  please keep a record of the readings and bring it to your next appointment here (or you can bring the meter itself).  You can write it on any piece of paper.  please call us sooner if your blood sugar goes below 70, or if you have a lot of readings over 200. We will need to take this complex situation in stages.  For now, please:  Increase the humalog to 50-60 units 3 times a day (just before each meal), and: Please continue the same lantus.  Please call or message [place] next week, to tell us how the blood sugar is doing.  Please come back for a follow-up appointment in 6 weeks.

## 2017-02-25 NOTE — Progress Notes (Signed)
Subjective:    Patient ID: [name], male    DOB: 11-17-1940, 76 y.o.   MRN: [id number]  HPI Pt returns for f/u of diabetes mellitus: DM type: Insulin-requiring type 2.   Dx'ed: 8616 Complications: renal insuff Therapy: insulin since 2017, and 2 oral meds DKA: never Severe hypoglycemia: never Pancreatitis: never Pancreatic imaging: normal in 2014 [place].  Other: he tales multiple daily injections.   Interval history: no cbg record, but states cbg's vary from 196-300's.  There is no trend throughout the day.  pt states he feels well in general.  Pt says he seldom misses the insulin.  Past Medical History:  Diagnosis Date  . Arthritis    arthirtis all over  . Bell's palsy   . Concussion    multiple- college football player  . Diabetes mellitus type 2 in obese ([place])   . Frequent urination at night   . GERD (gastroesophageal reflux disease)    occ indigestion  . Glaucoma    both eyes  . Hyperlipemia   . Hypertension   . Kidney stones   . PONV (postoperative nausea and vomiting)   . Primary localized osteoarthritis of right hip   . Primary osteoarthritis of right knee   . Restless leg syndrome   . Right knee DJD   . Spinal stenosis of lumbar region with radiculopathy    s/p L4/L5 transforaminal blocks    Past Surgical History:  Procedure Laterality Date  . ABDOMINAL HERNIA REPAIR    . ANTERIOR HIP REVISION Right 01/08/2015   Procedure: RIGHT ANTERIOR HIP REVISION;  Surgeon: Renette Butters, MD;  Location: [facility];  Service: Orthopedics;  Laterality: Right;  . BACK SURGERY    . CANTHOPLASTY Right 02/09/2017   Procedure: RIGHT LATERAL CANTHOPLASTY;  Surgeon: Irene Limbo, MD;  Location: [facility];  Service: Plastics;  Laterality: Right;  . COLONOSCOPY    . ELBOW ARTHROSCOPY  1990   right  . ELBOW SURGERY     right  . HERNIA REPAIR  1980   inguinal  . HIP CLOSED REDUCTION Right 11/09/2014   Procedure: CLOSED REDUCTION HIP, s/p total hip 8/9;   Surgeon: Ninetta Lights, MD;  Location: [facility];  Service: Orthopedics;  Laterality: Right;  . JOINT REPLACEMENT  2008   knee  . LITHOTRIPSY    . [place] SURGERY     2015  . REVISION TOTAL HIP ARTHROPLASTY Right 01/08/2015  . SHOULDER ARTHROSCOPY  1980   right  . SHOULDER SURGERY     right  . STONE EXTRACTION WITH BASKET  2010  . TOTAL HIP ARTHROPLASTY Right 11/06/2014   Procedure: RIGHT TOTAL HIP ARTHROPLASTY ANTERIOR APPROACH;  Surgeon: Renette Butters, MD;  Location: [facility];  Service: Orthopedics;  Laterality: Right;  . TOTAL KNEE ARTHROPLASTY Left 07/2006   left knee  . TOTAL KNEE ARTHROPLASTY Right 01/08/2014   Procedure: RIGHT TOTAL KNEE ARTHROPLASTY;  Surgeon: Lorn Junes, MD;  Location: [facility];  Service: Orthopedics;  Laterality: Right;    Social History   Socioeconomic History  . Marital status: Married    Spouse name: Not on file  . Number of children: Not on file  . Years of education: Not on file  . Highest education level: Not on file  Social Needs  . Financial resource strain: Not on file  . Food insecurity - worry: Not on file  . Food insecurity - inability: Not on file  . Transportation needs - medical:  Not on file  . Transportation needs - non-medical: Not on file  Occupational History  . Not on file  Tobacco Use  . Smoking status: Never Smoker  . Smokeless tobacco: Never Used  Substance and Sexual Activity  . Alcohol use: Yes    Comment: glass of wine every 3-4 months  . Drug use: No  . Sexual activity: No  Other Topics Concern  . 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    1 son, 1 daughter, both out of state    Current Outpatient Medications on File Prior to Visit  Medication Sig Dispense Refill  . allopurinol (ZYLOPRIM) 300 MG tablet Take 0.5 tablets (150 mg total) by mouth every morning. Take half a tablet (148m) by mouth daily 45 tablet 3  .  amLODipine (NORVASC) 10 MG tablet Take 1 tablet (10 mg total) by mouth daily. 90 tablet 3  . aspirin 81 MG tablet Take 81 mg by mouth daily.    .Marland Kitchenatorvastatin (LIPITOR) 10 MG tablet Take 1 tablet (10 mg total) by mouth at bedtime. 90 tablet 3  . Blood Glucose Monitoring Suppl (ONE TOUCH ULTRA 2) w/Device KIT 1 each daily by Does not apply route. 1 each 0  . gemfibrozil (LOPID) 600 MG tablet Take 1 tablet (600 mg total) by mouth 2 (two) times daily. 180 tablet 1  . glucose blood (ONE TOUCH ULTRA TEST) test strip Check blood sugar before and after each meal and as directed. Dx E11.9 200 each 5  . HYDROcodone-acetaminophen (NORCO/VICODIN) 5-325 MG tablet Take 1 tablet every 4 (four) hours as needed by mouth for moderate pain. 5 tablet 0  . LANTUS SOLOSTAR 100 UNIT/ML Solostar Pen Inject 50 Units into the skin 2 (two) times daily.    .Marland Kitchenlatanoprost (XALATAN) 0.005 % ophthalmic solution Place 1 drop into both eyes at bedtime.    .Marland Kitchenlosartan (COZAAR) 100 MG tablet Take 1 tablet (100 mg total) by mouth daily. 90 tablet 3  . Multiple Vitamins-Minerals (VITRUM 50+ SENIOR MULTI PO) Take 1 tablet by mouth daily.      No current facility-administered medications on file prior to visit.     Allergies  Allergen Reactions  . Morphine And Related     Causes nausea & vomiting  . Codeine Sulfate Other (See Comments)    intolerant    Family History  Problem Relation Age of Onset  . Hypertension Mother   . Kidney disease Mother   . Hypertension Father   . Diabetes Mellitus II Father   . Colon polyps Father   . Diabetes Mellitus II Brother   . Diabetes Mellitus II Brother   . Cancer - Prostate Brother   . Colon cancer Neg Hx     BP (!) 142/88 (BP Location: Right Arm, Patient Position: Sitting, Cuff Size: Normal)   Pulse 87   Wt 277 lb (125.6 kg)   SpO2 96%   BMI 37.57 kg/m    Review of Systems He denies hypoglycemia.    Objective:   Physical Exam VITAL SIGNS:  See vs page GENERAL: no  distress Gait: normal and steady      Assessment & Plan:  Insulin-requiring type 2 DM: he needs increased rx   Patient Instructions  check your blood sugar twice a day.  vary the time of day when you check, between before the 3 meals, and at  bedtime.  also check if you have symptoms of your blood sugar being too high or too low.  please keep a record of the readings and bring it to your next appointment here (or you can bring the meter itself).  You can write it on any piece of paper.  please call us sooner if your blood sugar goes below 70, or if you have a lot of readings over 200. We will need to take this complex situation in stages.  For now, please:  Increase the humalog to 50-60 units 3 times a day (just before each meal), and: Please continue the same lantus.  Please call or message [place] next week, to tell us how the blood sugar is doing.  Please come back for a follow-up appointment in 6 weeks.

## 2017-03-02 ENCOUNTER — Encounter: Payer: Self-pay | Admitting: Family Medicine

## 2017-03-02 ENCOUNTER — Ambulatory Visit: Payer: PPO | Admitting: Family Medicine

## 2017-03-02 DIAGNOSIS — R059 Cough, unspecified: Secondary | ICD-10-CM

## 2017-03-02 DIAGNOSIS — R05 Cough: Secondary | ICD-10-CM

## 2017-03-02 DIAGNOSIS — R399 Unspecified symptoms and signs involving the genitourinary system: Secondary | ICD-10-CM | POA: Diagnosis not present

## 2017-03-02 MED ORDER — DOXYCYCLINE HYCLATE 100 MG PO TABS: 100.0000 mg | ORAL_TABLET | Freq: Two times a day (BID) | ORAL | 0 refills | Status: DC

## 2017-03-02 MED ORDER — TAMSULOSIN HCL 0.4 MG PO CAPS: 0.4000 mg | ORAL_CAPSULE | Freq: Every day | ORAL | 3 refills | Status: DC

## 2017-03-02 NOTE — Patient Instructions (Signed)
Your lungs are clear.  Use doxycycline if you get worse.  Keep taking delsym in the meantime.  Rest and fluids.  Update me as needed.  Take care.  Glad to see you.

## 2017-03-02 NOTE — Progress Notes (Signed)
duration of symptoms: about 10 days ago.   First noted ST and post nasal gtt/cough.  rhinorrhea:yes congestion:yes ear pain:no sore throat:yes cough: yes, with sputum Myalgias: no No fevers.   Using cough drop in the meantime. He is better today.     He has f/u with urology pending.  He has LUTS with slow stream, nocturia, urinary incontinence.  Nocturia is bothersome enough to treat.  PSA recently wnl.    BP elevation likely related to recent prednisone use for hip pain and with recent illness.  Sugar improved now off prednisone.    Per HPI unless specifically indicated in ROS section   Meds, vitals, and allergies reviewed.   GEN: nad, alert and oriented HEENT: mucous membranes moist, TM w/o erythema, nasal epithelium injected, OP with cobblestoning NECK: supple w/o LA CV: rrr. PULM: ctab, no inc wob ABD: soft, +bs EXT: no edema Sinuses not ttp

## 2017-03-04 DIAGNOSIS — R399 Unspecified symptoms and signs involving the genitourinary system: Secondary | ICD-10-CM | POA: Insufficient documentation

## 2017-03-04 NOTE — Assessment & Plan Note (Signed)
His  lungs are clear.  Use doxycycline if sx get worse, would hold on abx for now.  Keep taking delsym in the meantime.  Rest and fluids.  Update me as needed. He agrees.

## 2017-03-04 NOTE — Assessment & Plan Note (Signed)
Likely from BPH. Nocturia is bothersome enough to treat.  PSA recently wnl.   Start flomax with routine cautions.  He agrees. Update me as needed.

## 2017-03-05 DIAGNOSIS — M25551 Pain in right hip: Secondary | ICD-10-CM | POA: Diagnosis not present

## 2017-03-15 ENCOUNTER — Other Ambulatory Visit: Payer: Self-pay | Admitting: *Deleted

## 2017-03-15 ENCOUNTER — Telehealth: Payer: Self-pay | Admitting: Family Medicine

## 2017-03-15 MED ORDER — LANTUS SOLOSTAR 100 UNIT/ML ~~LOC~~ SOPN: 50.0000 [IU] | PEN_INJECTOR | Freq: Two times a day (BID) | SUBCUTANEOUS | Status: DC

## 2017-03-15 NOTE — Telephone Encounter (Signed)
Rx refilled per office plan and patient request.

## 2017-03-15 NOTE — Telephone Encounter (Signed)
Copied from [place] [id number]. Topic: Quick Communication - Rx Refill/Question >> Mar 15, 2017 10:29 AM Synthia Innocent wrote: Has the patient contacted their pharmacy? Yes.     (Agent: If no, request that the patient contact the pharmacy for the refill.)   Preferred Pharmacy (with phone number or street name): [facility]   Agent: Please be advised that RX refills may take up to 3 business days. We ask that you follow-up with your pharmacy. Needs another box of LANTUS SOLOSTAR 100 UNIT/ML Solostar Pen, just had filled a week or so, but patient is going out of town

## 2017-04-06 DIAGNOSIS — R351 Nocturia: Secondary | ICD-10-CM | POA: Diagnosis not present

## 2017-04-06 DIAGNOSIS — R3915 Urgency of urination: Secondary | ICD-10-CM | POA: Diagnosis not present

## 2017-04-06 DIAGNOSIS — R350 Frequency of micturition: Secondary | ICD-10-CM | POA: Diagnosis not present

## 2017-04-07 DIAGNOSIS — M25551 Pain in right hip: Secondary | ICD-10-CM | POA: Diagnosis not present

## 2017-04-09 ENCOUNTER — Ambulatory Visit: Payer: PPO | Admitting: Endocrinology

## 2017-04-09 ENCOUNTER — Encounter: Payer: Self-pay | Admitting: Endocrinology

## 2017-04-09 VITALS — BP 152/98 | HR 84 | Temp 98.0°F | Wt 279.6 lb

## 2017-04-09 DIAGNOSIS — E669 Obesity, unspecified: Secondary | ICD-10-CM | POA: Diagnosis not present

## 2017-04-09 DIAGNOSIS — E1169 Type 2 diabetes mellitus with other specified complication: Secondary | ICD-10-CM | POA: Diagnosis not present

## 2017-04-09 LAB — POCT GLYCOSYLATED HEMOGLOBIN (HGB A1C): Hemoglobin A1C: 8.9

## 2017-04-09 MED ORDER — LANTUS SOLOSTAR 100 UNIT/ML ~~LOC~~ SOPN: 250.0000 [IU] | PEN_INJECTOR | SUBCUTANEOUS | 11 refills | Status: DC

## 2017-04-09 NOTE — Patient Instructions (Addendum)
check your blood sugar twice a day.  vary the time of day when you check, between before the 3 meals, and at bedtime.  also check if you have symptoms of your blood sugar being too high or too low.  please keep a record of the readings and bring it to your next appointment here (or you can bring the meter itself).  You can write it on any piece of paper.  please call us sooner if your blood sugar goes below 70, or if you have a lot of readings over 200.  For now, please:  Stop taking the novolog, and: Increase the lantus to 250 units each morning Please come back for a follow-up appointment in 2 months.

## 2017-04-09 NOTE — Progress Notes (Signed)
Subjective:    Patient ID: [name], male    DOB: Jan 25, 1941, 77 y.o.   MRN: [id number]  HPI Pt returns for f/u of diabetes mellitus: DM type: Insulin-requiring type 2.   Dx'ed: 7262 Complications: renal insuff Therapy: insulin since 2017, and 2 oral meds.  DKA: never Severe hypoglycemia: never Pancreatitis: never Pancreatic imaging: normal in 2014 [place].  Other: he takes multiple daily injections.   Interval history: no cbg record, but states cbg's vary from 80-400.  It is lowest in the afternoon.  pt states he feels well in general.  Pt says he never misses the insulin.   Past Medical History:  Diagnosis Date  . Arthritis    arthirtis all over  . Bell's palsy   . Concussion    multiple- college football player  . Diabetes mellitus type 2 in obese ([place])   . Frequent urination at night   . GERD (gastroesophageal reflux disease)    occ indigestion  . Glaucoma    both eyes  . Hyperlipemia   . Hypertension   . Kidney stones   . PONV (postoperative nausea and vomiting)   . Primary localized osteoarthritis of right hip   . Primary osteoarthritis of right knee   . Restless leg syndrome   . Right knee DJD   . Spinal stenosis of lumbar region with radiculopathy    s/p L4/L5 transforaminal blocks    Past Surgical History:  Procedure Laterality Date  . ABDOMINAL HERNIA REPAIR    . ANTERIOR HIP REVISION Right 01/08/2015   Procedure: RIGHT ANTERIOR HIP REVISION;  Surgeon: Renette Butters, MD;  Location: [facility];  Service: Orthopedics;  Laterality: Right;  . BACK SURGERY    . CANTHOPLASTY Right 02/09/2017   Procedure: RIGHT LATERAL CANTHOPLASTY;  Surgeon: Irene Limbo, MD;  Location: [facility];  Service: Plastics;  Laterality: Right;  . COLONOSCOPY    . ELBOW ARTHROSCOPY  1990   right  . ELBOW SURGERY     right  . HERNIA REPAIR  1980   inguinal  . HIP CLOSED REDUCTION Right 11/09/2014   Procedure: CLOSED REDUCTION HIP, s/p total hip 8/9;  Surgeon:  Ninetta Lights, MD;  Location: [facility];  Service: Orthopedics;  Laterality: Right;  . JOINT REPLACEMENT  2008   knee  . LITHOTRIPSY    . [place] SURGERY     2015  . RECONSTRUCTION OF EYELID    . REVISION TOTAL HIP ARTHROPLASTY Right 01/08/2015  . SHOULDER ARTHROSCOPY  1980   right  . SHOULDER SURGERY     right  . STONE EXTRACTION WITH BASKET  2010  . TOTAL HIP ARTHROPLASTY Right 11/06/2014   Procedure: RIGHT TOTAL HIP ARTHROPLASTY ANTERIOR APPROACH;  Surgeon: Renette Butters, MD;  Location: [facility];  Service: Orthopedics;  Laterality: Right;  . TOTAL KNEE ARTHROPLASTY Left 07/2006   left knee  . TOTAL KNEE ARTHROPLASTY Right 01/08/2014   Procedure: RIGHT TOTAL KNEE ARTHROPLASTY;  Surgeon: Lorn Junes, MD;  Location: [facility];  Service: Orthopedics;  Laterality: Right;    Social History   Socioeconomic History  . Marital status: Married    Spouse name: Not on file  . Number of children: Not on file  . Years of education: Not on file  . Highest education level: Not on file  Social Needs  . Financial resource strain: Not on file  . Food insecurity - worry: Not on file  . Food insecurity - inability: Not  on file  . Transportation needs - medical: Not on file  . Transportation needs - non-medical: Not on file  Occupational History  . Not on file  Tobacco Use  . Smoking status: Never Smoker  . Smokeless tobacco: Never Used  Substance and Sexual Activity  . Alcohol use: Yes    Comment: glass of wine every 3-4 months  . Drug use: No  . Sexual activity: No  Other Topics Concern  . 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    1 son, 1 daughter, both out of state    Current Outpatient Medications on File Prior to Visit  Medication Sig Dispense Refill  . allopurinol (ZYLOPRIM) 300 MG tablet Take 0.5 tablets (150 mg total) by mouth every morning. Take half a tablet (165m) by mouth  daily 45 tablet 3  . amLODipine (NORVASC) 10 MG tablet Take 1 tablet (10 mg total) by mouth daily. 90 tablet 3  . aspirin 81 MG tablet Take 81 mg by mouth daily.    .Marland Kitchenatorvastatin (LIPITOR) 10 MG tablet Take 1 tablet (10 mg total) by mouth at bedtime. 90 tablet 3  . Blood Glucose Monitoring Suppl (ONE TOUCH ULTRA 2) w/Device KIT 1 each daily by Does not apply route. 1 each 0  . doxycycline (VIBRA-TABS) 100 MG tablet Take 1 tablet (100 mg total) by mouth 2 (two) times daily. 20 tablet 0  . gemfibrozil (LOPID) 600 MG tablet Take 1 tablet (600 mg total) by mouth 2 (two) times daily. 180 tablet 1  . glucose blood (ONE TOUCH ULTRA TEST) test strip Check blood sugar before and after each meal and as directed. Dx E11.9 200 each 5  . HYDROcodone-acetaminophen (NORCO/VICODIN) 5-325 MG tablet Take 1 tablet every 4 (four) hours as needed by mouth for moderate pain. 5 tablet 0  . latanoprost (XALATAN) 0.005 % ophthalmic solution Place 1 drop into both eyes at bedtime.    .Marland Kitchenlosartan (COZAAR) 100 MG tablet Take 1 tablet (100 mg total) by mouth daily. 90 tablet 3  . Multiple Vitamins-Minerals (VITRUM 50+ SENIOR MULTI PO) Take 1 tablet by mouth daily.     . tamsulosin (FLOMAX) 0.4 MG CAPS capsule Take 1 capsule (0.4 mg total) by mouth daily. 30 capsule 3   No current facility-administered medications on file prior to visit.     Allergies  Allergen Reactions  . Morphine And Related     Causes nausea & vomiting  . Codeine Sulfate Other (See Comments)    intolerant    Family History  Problem Relation Age of Onset  . Hypertension Mother   . Kidney disease Mother   . Hypertension Father   . Diabetes Mellitus II Father   . Colon polyps Father   . Diabetes Mellitus II Brother   . Diabetes Mellitus II Brother   . Cancer - Prostate Brother   . Colon cancer Neg Hx     BP (!) 152/98 (BP Location: Left Arm, Patient Position: Sitting, Cuff Size: Normal)   Pulse 84   Temp 98 F (36.7 C)   Wt 279 lb 9.6  oz (126.8 kg)   SpO2 96%   BMI 37.92 kg/m    Review of Systems He denies LOC.     Objective:   Physical Exam VITAL SIGNS:  See vs page GENERAL: no distress Pulses: foot pulses are intact bilaterally.   MSK:  no deformity of the feet or ankles.  CV: 1+ bilat edema of the legs Skin:  no ulcer on the feet or ankles.  normal color and temp on the feet and ankles Neuro: sensation is intact to touch on the feet and ankles.   Ext: There is bilateral onychomycosis of the toenails.    A1c=8.9%    Assessment & Plan:  Insulin-requiring type 2 DM, with renal insuff: ongoing poor glycemic control.  He needs a simpler regimen.    Patient Instructions  check your blood sugar twice a day.  vary the time of day when you check, between before the 3 meals, and at bedtime.  also check if you have symptoms of your blood sugar being too high or too low.  please keep a record of the readings and bring it to your next appointment here (or you can bring the meter itself).  You can write it on any piece of paper.  please call us sooner if your blood sugar goes below 70, or if you have a lot of readings over 200.  For now, please:  Stop taking the novolog, and: Increase the lantus to 250 units each morning Please come back for a follow-up appointment in 2 months.

## 2017-05-11 ENCOUNTER — Ambulatory Visit (INDEPENDENT_AMBULATORY_CARE_PROVIDER_SITE_OTHER): Payer: PPO | Admitting: Neurology

## 2017-05-11 ENCOUNTER — Encounter: Payer: Self-pay | Admitting: Neurology

## 2017-05-11 ENCOUNTER — Telehealth: Payer: Self-pay | Admitting: Neurology

## 2017-05-11 VITALS — BP 148/70 | HR 65 | Wt 278.2 lb

## 2017-05-11 DIAGNOSIS — G510 Bell's palsy: Secondary | ICD-10-CM

## 2017-05-11 MED ORDER — VALACYCLOVIR HCL 1 G PO TABS: 1000.0000 mg | ORAL_TABLET | Freq: Two times a day (BID) | ORAL | 0 refills | Status: DC

## 2017-05-11 MED ORDER — METHYLPREDNISOLONE 4 MG PO TBPK: ORAL_TABLET | ORAL | 0 refills | Status: DC

## 2017-05-11 NOTE — Telephone Encounter (Signed)
Patient is requesting to have his MRI with the open mri machine. I faxed clinical notes to [facility]

## 2017-05-11 NOTE — Progress Notes (Signed)
PATIENT: [name] DOB: 10-04-1940  REASON FOR VISIT: follow up HISTORY FROM: patient  HISTORY OF PRESENT ILLNESS: Today 05/11/17  [name] is a 77-year-old male with a history of Bell's palsy.  He returns today for follow-up. He does feel that his symptoms have improved.  he states that he continues to have trouble with closure of the right eye. He states the lower lid is drooping. he has seen his ophthalmologist Dr. Katy Fitch. He was referred to a cosmetic surgeon at [facility]. He is considering surgery to improve the lower lid. Right now he continues to use artificial tears as well as lubrication to protect the eye.  The patient continues to have a slight facial droop on the right side of the mouth. He states that he is able to  hold liquids on that side although on occasion liquids will leak out of the right corner of the mouth. He states that he is talking better. He reports his is been a slow recovery. He returns today for an evaluation.   HISTORY [name] is a 77 year old pleasant Caucasian male who seen for new office consultation today upon request from Dr. Damita Dunnings. His accompanied by his wife. I have reviewed referral notes from primary physician, imaging films and electronic medical records personally. The patient was visiting [place] to look after his son-in-law who was sick from cancer in May this year when he developed sudden onset of pain in his right temple as well as occipital regions and neck strain notice he was unable to close his right eye and had right facial weakness. He refused to seek medical help there and upon arrival in [place] was eventually seen in the emergency room on 08/18/16. He underwent MRI scan of the brain which I personally reviewed and showed no acute abnormality. The patient Was treated with a two-week course of Valtrex and steroids which he has completed. He still has significant right-sided facial weakness. He is unable to close his eyes. He does  use Systane eyedrops liberally during the day and uses Systane gel at night when sleeping. He denies any alteration of taste. He states that occasionally his bothered by loud sound. He denies any decreased sensation on the right side. He denies any hearing problems or ear pain. He has no prior history of Bell's palsy but does have a history of chickenpox as a child. He has no history of shingles. He denies any tick bite and does not have any pets who have tics. He has a remote history of runs and diplopia in 2016 for which he was seen in the hospital by Dr. 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 His diplopia cleared in 2-3 months. He denies significant headaches, vertigo, gait or balance problems. He does have diabetes and feels his sugars are difficult to control.He does have some redness in his right eye but denies significant pain or dryness of infection. He has not seen an eye doctor. Update 05/11/2017 : Patient is seen today as he called complaining of new complaints of left-sided facial weakness for the last 2 weeks. Patient states that this began insidiously. He complains of pain behind his left ear. He has inability to close his left eye which has become slightly red. He had right-sided Bell's palsy in May 2018 and made gradual improvement with that. He had seen an ophthalmologist and underwent surgery on the retraction of the right lower eyelid which went well. He has been using  artificial tears to keep his left eye moist but he has not seen his primary care physician or tried Valtrex of prednisone. He denies any tick bites and does not have any pets which have tics. He denies any herpes zoster flareup. He had an MRI scan the brain on 08/18/16 but it was without contrast for his Bell's palsy. Previously had a microvascular sixth nerve palsy causing diplopia in June 2017. He denies any significant headaches, blurred vision, diplopia, vertigo, gait or balance problems.   [name] REVIEW OF SYSTEMS: Out of a complete 14 system review of symptoms, the patient complains only of the following symptoms, and all other reviewed systems are negative.  Eye discharge, eye pain, left retro auricular pain  ALLERGIES: Allergies  Allergen Reactions  . Morphine And Related Nausea And Vomiting    Causes nausea & vomiting Causes nausea & vomiting  . Codeine Sulfate Other (See Comments)    intolerant    HOME MEDICATIONS: Outpatient Medications Prior to Visit  Medication Sig Dispense Refill  . allopurinol (ZYLOPRIM) 300 MG tablet Take 0.5 tablets (150 mg total) by mouth every morning. Take half a tablet (138m) by mouth daily 45 tablet 3  . allopurinol (ZYLOPRIM) 300 MG tablet Take by mouth.    .Marland KitchenamLODipine (NORVASC) 10 MG tablet Take 1 tablet (10 mg total) by mouth daily. 90 tablet 3  . aspirin 81 MG tablet Take 81 mg by mouth daily.    .Marland Kitchenatorvastatin (LIPITOR) 10 MG tablet Take 1 tablet (10 mg total) by mouth at bedtime. 90 tablet 3  . Blood Glucose Monitoring Suppl (ONE TOUCH ULTRA 2) w/Device KIT 1 each daily by Does not apply route. 1 each 0  . gemfibrozil (LOPID) 600 MG tablet Take 1 tablet (600 mg total) by mouth 2 (two) times daily. 180 tablet 1  . glucose blood (ONE TOUCH ULTRA TEST) test strip Check blood sugar before and after each meal and as directed. Dx E11.9 200 each 5  . LANTUS SOLOSTAR 100 UNIT/ML Solostar Pen Inject 250 Units into the skin every morning. 25 pen 11  . latanoprost (XALATAN) 0.005 % ophthalmic solution Place 1 drop into both eyes at bedtime.    .Marland Kitchenlosartan (COZAAR) 100 MG tablet Take 1 tablet (100 mg total) by mouth daily. 90 tablet 3  . metFORMIN (GLUCOPHAGE) 500 MG tablet Take by mouth.    . Multiple Vitamins-Minerals (VITRUM 50+ SENIOR MULTI PO) Take 1 tablet by mouth daily.     . Olmesartan-Amlodipine-HCTZ 40-10-25 MG TABS Take by mouth.    .Marland Kitchenaspirin 81 MG tablet Take by mouth.    . doxycycline (VIBRA-TABS) 100 MG tablet Take 1 tablet (100  mg total) by mouth 2 (two) times daily. (Patient not taking: Reported on 05/11/2017) 20 tablet 0  . HYDROcodone-acetaminophen (NORCO/VICODIN) 5-325 MG tablet Take 1 tablet every 4 (four) hours as needed by mouth for moderate pain. (Patient not taking: Reported on 05/11/2017) 5 tablet 0  . insulin regular (NOVOLIN R,HUMULIN R) 100 units/mL injection Inject into the skin.    . tamsulosin (FLOMAX) 0.4 MG CAPS capsule Take 1 capsule (0.4 mg total) by mouth daily. (Patient not taking: Reported on 05/11/2017) 30 capsule 3  . traMADol (ULTRAM) 50 MG tablet Take by mouth.    . traZODone (DESYREL) 50 MG tablet Take by mouth.     No facility-administered medications prior to visit.     PAST MEDICAL HISTORY: Past Medical History:  Diagnosis Date  . Arthritis    arthirtis  all over  . Bell's palsy   . Concussion    multiple- college football player  . Diabetes mellitus type 2 in obese ([place])   . Frequent urination at night   . GERD (gastroesophageal reflux disease)    occ indigestion  . Glaucoma    both eyes  . Hyperlipemia   . Hypertension   . Kidney stones   . PONV (postoperative nausea and vomiting)   . Primary localized osteoarthritis of right hip   . Primary osteoarthritis of right knee   . Restless leg syndrome   . Right knee DJD   . Spinal stenosis of lumbar region with radiculopathy    s/p L4/L5 transforaminal blocks    PAST SURGICAL HISTORY: Past Surgical History:  Procedure Laterality Date  . ABDOMINAL HERNIA REPAIR    . ANTERIOR HIP REVISION Right 01/08/2015   Procedure: RIGHT ANTERIOR HIP REVISION;  Surgeon: Renette Butters, MD;  Location: [place];  Service: Orthopedics;  Laterality: Right;  . BACK SURGERY    . CANTHOPLASTY Right 02/09/2017   Procedure: RIGHT LATERAL CANTHOPLASTY;  Surgeon: Irene Limbo, MD;  Location: [place];  Service: Plastics;  Laterality: Right;  . COLONOSCOPY    . ELBOW ARTHROSCOPY  1990   right  . ELBOW SURGERY     right  .  HERNIA REPAIR  1980   inguinal  . HIP CLOSED REDUCTION Right 11/09/2014   Procedure: CLOSED REDUCTION HIP, s/p total hip 8/9;  Surgeon: Ninetta Lights, MD;  Location: [place];  Service: Orthopedics;  Laterality: Right;  . JOINT REPLACEMENT  2008   knee  . LITHOTRIPSY    . [place] SURGERY     2015  . RECONSTRUCTION OF EYELID    . REVISION TOTAL HIP ARTHROPLASTY Right 01/08/2015  . SHOULDER ARTHROSCOPY  1980   right  . SHOULDER SURGERY     right  . STONE EXTRACTION WITH BASKET  2010  . TOTAL HIP ARTHROPLASTY Right 11/06/2014   Procedure: RIGHT TOTAL HIP ARTHROPLASTY ANTERIOR APPROACH;  Surgeon: Renette Butters, MD;  Location: [facility];  Service: Orthopedics;  Laterality: Right;  . TOTAL KNEE ARTHROPLASTY Left 07/2006   left knee  . TOTAL KNEE ARTHROPLASTY Right 01/08/2014   Procedure: RIGHT TOTAL KNEE ARTHROPLASTY;  Surgeon: Lorn Junes, MD;  Location: [place];  Service: Orthopedics;  Laterality: Right;    FAMILY HISTORY: Family History  Problem Relation Age of Onset  . Hypertension Mother   . Kidney disease Mother   . Hypertension Father   . Diabetes Mellitus II Father   . Colon polyps Father   . Diabetes Mellitus II Brother   . Diabetes Mellitus II Brother   . Cancer - Prostate Brother   . Colon cancer Neg Hx     SOCIAL HISTORY: Social History   Socioeconomic History  . Marital status: Married    Spouse name: Not on file  . Number of children: Not on file  . Years of education: Not on file  . Highest education level: Not on file  Social Needs  . Financial resource strain: Not on file  . Food insecurity - worry: Not on file  . Food insecurity - inability: Not on file  . Transportation needs - medical: Not on file  . Transportation needs - non-medical: Not on file  Occupational History  . Not on file  Tobacco Use  . Smoking status: Never Smoker  . Smokeless tobacco: Never Used  Substance and Sexual Activity  .  Alcohol use: Yes    Comment: glass of wine every  3-4 months  . Drug use: No  . Sexual activity: No  Other Topics Concern  . 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    1 son, 1 daughter, both out of state      PHYSICAL EXAM  Vitals:   05/11/17 1154  BP: (!) 148/70  Pulse: 65  Weight: 278 lb 3.2 oz (126.2 kg)   Body mass index is 37.73 kg/m.  Generalized: Obese elderly Caucasian male not in distress in no acute distress mild conjunctival injection of the left lower eyelid. Hearing intact bilaterally.  Neurological examination  Mentation: Alert oriented to time, place, history taking. Follows all commands speech and language fluent Cranial nerve II-XII: Pupils were equal round reactive to light. Extraocular movements were full, visual field were full on confrontational test.  Uvula tongue midline. Head turning and shoulder shrug  were normal and symmetric. drooping of the right lower lid. Puff cheeks minimalweakness on the right. Facial droop in the right corner of the mouth.significant weakness of left half of the face including forehead, inability to close left eyelid drooping of the left lower eyelid. Weakness of nasolabial fold and puffing cheeks. No hyperacusis or hearing loss. No alteration of taste.  Motor: The motor testing reveals 5 over 5 strength of all 4 extremities. Good symmetric motor tone is noted throughout.  Sensory: Sensory testing is intact to soft touch on all 4 extremities. No evidence of extinction is noted.  Coordination: Cerebellar testing reveals good finger-nose-finger and heel-to-shin bilaterally.  Gait and station:  Patient has a slight limp when he ambulates. Tandem gait not attempted. Reflexes: Deep tendon reflexes are symmetric and normal bilaterally.   DIAGNOSTIC DATA (LABS, IMAGING, TESTING) - I reviewed patient records, labs, notes, testing and imaging myself where available.  Lab Results  Component  Value Date   WBC 6.4 08/18/2016   HGB 15.0 08/18/2016   HCT 43.3 08/18/2016   MCV 89.6 08/18/2016   PLT 295 08/18/2016      Component Value Date/Time   NA 135 02/04/2017 1222   K 3.5 02/04/2017 1222   CL 102 02/04/2017 1222   CO2 24 02/04/2017 1222   GLUCOSE 238 (H) 02/04/2017 1222   BUN 16 02/04/2017 1222   CREATININE 1.17 02/04/2017 1222   CREATININE 1.44 12/21/2014   CALCIUM 9.2 02/04/2017 1222   PROT 7.7 08/18/2016 1522   ALBUMIN 3.9 08/18/2016 1522   AST 30 08/18/2016 1522   AST 16 12/21/2014   ALT 29 08/18/2016 1522   ALT 15 12/21/2014   ALKPHOS 36 (L) 08/18/2016 1522   BILITOT 0.7 08/18/2016 1522   GFRNONAA 59 (L) 02/04/2017 1222   GFRAA >60 02/04/2017 1222   Lab Results  Component Value Date   CHOL 181 02/08/2017   HDL 37.10 (L) 02/08/2017   LDLCALC 82 05/09/2014   LDLDIRECT 58.0 02/08/2017   TRIG (H) 02/08/2017    461.0 Triglyceride is over 400; calculations on Lipids are invalid.   CHOLHDL 5 02/08/2017   Lab Results  Component Value Date   HGBA1C 8.9 04/09/2017      ASSESSMENT AND PLAN 77 y.o. year old male  has a past medical history of Arthritis, Bell's palsy, Concussion, Diabetes mellitus type 2 in obese ([place]), Frequent urination at night, GERD (gastroesophageal reflux disease), Glaucoma, Hyperlipemia, Hypertension, Kidney stones, PONV (  postoperative nausea and vomiting), Primary localized osteoarthritis of right hip, Primary osteoarthritis of right knee, Restless leg syndrome, Right knee DJD, and Spinal stenosis of lumbar region with radiculopathy. here with:   1.h/o right sided  Bell's palsy in May 2018 with now left-sided new Bell's palsy for last 2 weeks I had a long discussion with the patient and his wife regarding his left-sided peripheral facial nerve VII palsy which is likely Bell's palsy. I recommend Valtrex 1 g twice daily for 2 weeks as well as Medrol dose tapering pack for 3 weeks. Check MRI scan of the brain to evaluate left facial nerve. I  have encouraged him to use artificial tears liberally during the day to keep his eyes moist and to use Lacri-Lube ointment at night. He was also encouraged to do facial muscle exercises. He'll return for follow-up in 2 months or call earlier if necessary      Antony Contras, MD 05/11/2017, 1:15 PM [facility] [address], [place], [place] [address] [phone]

## 2017-05-11 NOTE — Patient Instructions (Signed)
I had a long discussion with the patient and his wife regarding his left-sided peripheral facial nerve VII palsy which is likely Bell's palsy. I recommend Valtrex 1 g twice daily for 2 weeks as well as Medrol dose tapering pack for 3 weeks. Check MRI scan of the brain to evaluate left facial nerve. I have encouraged him to use artificial tears liberally during the day to keep his eyes moist and to use Lacri-Lube ointment at night. He was also encouraged to do facial muscle exercises. He'll return for follow-up in 2 months or call earlier if necessary   Bell Palsy, Adult Bell palsy is a short-term inability to move muscles in part of the face. The inability to move (paralysis) results from inflammation or compression of the facial nerve, which travels along the skull and under the ear to the side of the face (7th cranial nerve). This nerve is responsible for facial movements that include blinking, closing the eyes, smiling, and frowning. What are the causes? The exact cause of this condition is not known. It may be caused by an infection from a virus, such as the chickenpox (herpes zoster), Epstein-Barr, or mumps virus. What increases the risk? You are more likely to develop this condition if:  You are pregnant.  You have diabetes.  You have had a recent infection in your nose, throat, or airways (upper respiratory infection).  You have a weakened body defense system (immune system).  You have had a facial injury, such as a fracture.  You have a family history of Bell palsy.  What are the signs or symptoms? Symptoms of this condition include:  Weakness on one side of the face.  Drooping eyelid and corner of the mouth.  Excessive tearing in one eye.  Difficulty closing the eyelid.  Dry eye.  Drooling.  Dry mouth.  Changes in taste.  Change in facial appearance.  Pain behind one ear.  Ringing in one or both ears.  Sensitivity to sound in one ear.  Facial  twitching.  Headache.  Impaired speech.  Dizziness.  Difficulty eating or drinking.  Most of the time, only one side of the face is affected. Rarely, Bell palsy affects the whole face. How is this diagnosed? This condition is diagnosed based on:  Your symptoms.  Your medical history.  A physical exam.  You may also have to see health care providers who specialize in disorders of the nerves (neurologist) or diseases and conditions of the eye (ophthalmologist). You may have tests, such as:  A test to check for nerve damage (electromyogram).  Imaging studies, such as CT or MRI scans.  Blood tests.  How is this treated? This condition affects every person differently. Sometimes symptoms go away without treatment within a couple weeks. If treatment is needed, it varies from person to person. The goal of treatment is to reduce inflammation and protect the eye from damage. Treatment for Bell palsy may include:  Medicines, such as: ? Steroids to reduce swelling and inflammation. ? Antiviral drugs. ? Pain relievers, including aspirin, acetaminophen, or ibuprofen.  Eye drops or ointment to keep your eye moist.  Eye protection, if you cannot close your eye.  Exercises or massage to regain muscle strength and function (physical therapy).  Follow these instructions at home:  Take over-the-counter and prescription medicines only as told by your health care provider.  If your eye is affected: ? Keep your eye moist with eye drops or ointment as told by your health care provider. ?  Follow instructions for eye care and protection as told by your health care provider.  Do any physical therapy exercises as told by your health care provider.  Keep all follow-up visits as told by your health care provider. This is important. Contact a health care provider if:  You have a fever.  Your symptoms do not get better within 2-3 weeks, or your symptoms get worse.  Your eye is red,  irritated, or painful.  You have new symptoms. Get help right away if:  You have weakness or numbness in a part of your body other than your face.  You have trouble swallowing.  You develop neck pain or stiffness.  You develop dizziness or shortness of breath. Summary  Bell palsy is a short-term inability to move muscles in part of the face. The inability to move (paralysis) results from inflammation or compression of the facial nerve.  This condition affects every person differently. Sometimes symptoms go away without treatment within a couple weeks.  If treatment is needed, it varies from person to person. The goal of treatment is to reduce inflammation and protect the eye from damage.  Contact your health care provider if your symptoms do not get better within 2-3 weeks, or your symptoms get worse. This information is not intended to replace advice given to you by your health care provider. Make sure you discuss any questions you have with your health care provider. Document Released: 03/16/2005 Document Revised: 05/19/2016 Document Reviewed: 05/19/2016 Elsevier Interactive Patient Education  [employer].

## 2017-05-12 DIAGNOSIS — N401 Enlarged prostate with lower urinary tract symptoms: Secondary | ICD-10-CM | POA: Diagnosis not present

## 2017-05-12 DIAGNOSIS — R351 Nocturia: Secondary | ICD-10-CM | POA: Diagnosis not present

## 2017-05-12 DIAGNOSIS — R350 Frequency of micturition: Secondary | ICD-10-CM | POA: Diagnosis not present

## 2017-05-14 DIAGNOSIS — G510 Bell's palsy: Secondary | ICD-10-CM | POA: Diagnosis not present

## 2017-05-14 NOTE — Telephone Encounter (Signed)
Patient is scheduled at [facility] for Friday 04/13/17 at 1:30 pm.

## 2017-05-18 DIAGNOSIS — H16143 Punctate keratitis, bilateral: Secondary | ICD-10-CM | POA: Diagnosis not present

## 2017-05-18 DIAGNOSIS — H2513 Age-related nuclear cataract, bilateral: Secondary | ICD-10-CM | POA: Diagnosis not present

## 2017-05-18 DIAGNOSIS — H02135 Senile ectropion of left lower eyelid: Secondary | ICD-10-CM | POA: Diagnosis not present

## 2017-05-18 DIAGNOSIS — G510 Bell's palsy: Secondary | ICD-10-CM | POA: Diagnosis not present

## 2017-05-18 DIAGNOSIS — H04123 Dry eye syndrome of bilateral lacrimal glands: Secondary | ICD-10-CM | POA: Diagnosis not present

## 2017-05-18 DIAGNOSIS — H401111 Primary open-angle glaucoma, right eye, mild stage: Secondary | ICD-10-CM | POA: Diagnosis not present

## 2017-05-18 DIAGNOSIS — H401123 Primary open-angle glaucoma, left eye, severe stage: Secondary | ICD-10-CM | POA: Diagnosis not present

## 2017-05-26 ENCOUNTER — Other Ambulatory Visit: Payer: Self-pay | Admitting: Family Medicine

## 2017-05-26 DIAGNOSIS — E785 Hyperlipidemia, unspecified: Secondary | ICD-10-CM | POA: Insufficient documentation

## 2017-05-26 NOTE — Telephone Encounter (Signed)
Has tolerated, okay to continue.  Sent.  Thanks.

## 2017-05-26 NOTE — Telephone Encounter (Signed)
See drug  warning with Atorvastatin Last office 03/02/17  Last refill 12/04/16 #180/1

## 2017-05-28 ENCOUNTER — Telehealth: Payer: Self-pay

## 2017-05-28 NOTE — Telephone Encounter (Signed)
Dr. Leonie Man reviewed the report from [facility]. The report shows changes of age related shrinkage of brain and hardening of the arteries.

## 2017-05-28 NOTE — Telephone Encounter (Signed)
My charge message sent.

## 2017-05-31 ENCOUNTER — Ambulatory Visit: Payer: PPO | Admitting: Family Medicine

## 2017-05-31 NOTE — Telephone Encounter (Signed)
Left vm on patients vm to call back about results.

## 2017-06-03 NOTE — Telephone Encounter (Signed)
Left 2nd vm on pts home number to call back about MR brain results.

## 2017-06-07 ENCOUNTER — Ambulatory Visit: Payer: PPO | Admitting: Endocrinology

## 2017-06-09 ENCOUNTER — Ambulatory Visit: Payer: PPO | Admitting: Family Medicine

## 2017-06-10 ENCOUNTER — Ambulatory Visit (INDEPENDENT_AMBULATORY_CARE_PROVIDER_SITE_OTHER): Payer: PPO | Admitting: Family Medicine

## 2017-06-10 ENCOUNTER — Encounter: Payer: Self-pay | Admitting: Family Medicine

## 2017-06-10 DIAGNOSIS — I10 Essential (primary) hypertension: Secondary | ICD-10-CM | POA: Diagnosis not present

## 2017-06-10 DIAGNOSIS — B028 Zoster with other complications: Secondary | ICD-10-CM | POA: Diagnosis not present

## 2017-06-10 DIAGNOSIS — H02155 Paralytic ectropion of left lower eyelid: Secondary | ICD-10-CM | POA: Diagnosis not present

## 2017-06-10 DIAGNOSIS — J3489 Other specified disorders of nose and nasal sinuses: Secondary | ICD-10-CM

## 2017-06-10 DIAGNOSIS — G510 Bell's palsy: Secondary | ICD-10-CM

## 2017-06-10 DIAGNOSIS — E1169 Type 2 diabetes mellitus with other specified complication: Secondary | ICD-10-CM

## 2017-06-10 DIAGNOSIS — E669 Obesity, unspecified: Secondary | ICD-10-CM

## 2017-06-10 NOTE — Telephone Encounter (Signed)
Pt has called back for RN Katrina, he is asking for a call back please

## 2017-06-10 NOTE — Progress Notes (Signed)
Diabetes:  Using medications without difficulties: yes, 250 units of insulin a day.   Hypoglycemic episodes: no Hyperglycemic episodes: no Feet problems: some occ tingling in the feet, episodically Blood Sugars averaging: usually 110-150 eye exam within last year: yes He is just back from [place].  His sugar has been affected by diet changed recently with travel.    Hypertension:    Using medication without problems or lightheadedness: yes Chest pain with exertion:no Edema:no Short of breath:no Average home UOH:[id number] lower than today, d/w pt.  155M systolic or lower on home checks.    He isn't due for f/u CT chest, d/w pt.   R eye blink function is better now.  He is going to see the eye clinic today about L lower eyelid repair.  He has rhinorrhea that is likely at least related to the excessive tearing from the eyelid changes.  D/w pt.    Meds, vitals, and allergies reviewed.   ROS: Per HPI unless specifically indicated in ROS section   GEN: nad, alert and oriented HEENT: mucous membranes moist NECK: supple w/o LA CV: rrr. PULM: ctab, no inc wob ABD: soft, +bs EXT: no edema SKIN: no acute rash L lower eyelid laxity noted at baseline.  Chronic changes related to Bell's palsy noted.

## 2017-06-10 NOTE — Telephone Encounter (Signed)
LEft third vm returning patients vm about MR brain results.

## 2017-06-10 NOTE — Patient Instructions (Signed)
Get through the eye and diabetes follow up appointments.   Reasonable to recheck in about 4 months here.  We'll do labs at that point if needed.  We can set up the follow up CT for later this summer. Take care.  Glad to see you.

## 2017-06-10 NOTE — Telephone Encounter (Signed)
Pt returned the call to RN Katrina @ 9:04am while waiting pt stated that he will try her back later

## 2017-06-11 DIAGNOSIS — I10 Essential (primary) hypertension: Secondary | ICD-10-CM | POA: Insufficient documentation

## 2017-06-11 NOTE — Assessment & Plan Note (Signed)
I think he likely has some of this from the excessive tearing related to the eyelid changes.  He will follow-up with the ophthalmology clinic.

## 2017-06-11 NOTE — Assessment & Plan Note (Signed)
Continue work on diet and exercise.  Discussed with patient.  His blood pressure has been lower on home checks.  No change in meds today.  He agrees.  See after visit summary.

## 2017-06-11 NOTE — Assessment & Plan Note (Signed)
Right eye blink function is improved.  He is on follow-up with ophthalmology clinic about his left eye.  I will defer.

## 2017-06-11 NOTE — Assessment & Plan Note (Signed)
He is up to 250 units of insulin a day.  His sugars generally been controlled when he has been adhering to his diet.  He previously got off of his diet when he was frustrated with all of the trouble he had with Bell's palsy but is back on his diet now.  I encouraged him to stick with a routine diabetic diet.  He understood.  He will follow-up with endocrinology.

## 2017-06-14 ENCOUNTER — Ambulatory Visit: Payer: PPO | Admitting: Endocrinology

## 2017-06-14 ENCOUNTER — Encounter: Payer: Self-pay | Admitting: Endocrinology

## 2017-06-14 VITALS — BP 132/86 | HR 93 | Ht 72.0 in | Wt 280.0 lb

## 2017-06-14 DIAGNOSIS — E669 Obesity, unspecified: Secondary | ICD-10-CM

## 2017-06-14 DIAGNOSIS — E1169 Type 2 diabetes mellitus with other specified complication: Secondary | ICD-10-CM

## 2017-06-14 LAB — HEMOGLOBIN A1C: Hgb A1c MFr Bld: 8.3 % — ABNORMAL HIGH (ref 4.6–6.5)

## 2017-06-14 NOTE — Patient Instructions (Addendum)
check your blood sugar twice a day.  vary the time of day when you check, between before the 3 meals, and at bedtime.  also check if you have symptoms of your blood sugar being too high or too low.  please keep a record of the readings and bring it to your next appointment here (or you can bring the meter itself).  You can write it on any piece of paper.  please call us sooner if your blood sugar goes below 70, or if you have a lot of readings over 200. blood tests are requested for you today.  We'll let you know about the results.   Please come back for a follow-up appointment in 2 months.   

## 2017-06-14 NOTE — Telephone Encounter (Signed)
Pt returned RN's call. Please call him on cell at [phone]

## 2017-06-14 NOTE — Progress Notes (Signed)
Subjective:    Patient ID: [name], male    DOB: 1940/07/08, 77 y.o.   MRN: [id number]  HPI Pt returns for f/u of diabetes mellitus: DM type: Insulin-requiring type 2.   Dx'ed: 8088 Complications: renal insuff Therapy: insulin since 2017, and 2 oral meds.  DKA: never Severe hypoglycemia: never.  Pancreatitis: never Pancreatic imaging: normal on 2014 [place].  Other: he takes QD insulin, after poor results with multiple daily injections.   Interval history: no cbg record, but states cbg's vary from 120-160.   pt states he feels well in general.  Pt says he never misses the insulin.  He was rx'ed steroids for Bell's palsy x 1 week, approx 1 month ago.  He says this slightly and temporarily increased cbg's.   Past Medical History:  Diagnosis Date  . Arthritis    arthirtis all over  . Bell's palsy   . Concussion    multiple- college football player  . Diabetes mellitus type 2 in obese ([place])   . Frequent urination at night   . GERD (gastroesophageal reflux disease)    occ indigestion  . Glaucoma    both eyes  . Hyperlipemia   . Hypertension   . Kidney stones   . PONV (postoperative nausea and vomiting)   . Primary localized osteoarthritis of right hip   . Primary osteoarthritis of right knee   . Restless leg syndrome   . Right knee DJD   . Spinal stenosis of lumbar region with radiculopathy    s/p L4/L5 transforaminal blocks    Past Surgical History:  Procedure Laterality Date  . ABDOMINAL HERNIA REPAIR    . ANTERIOR HIP REVISION Right 01/08/2015   Procedure: RIGHT ANTERIOR HIP REVISION;  Surgeon: Renette Butters, MD;  Location: [facility];  Service: Orthopedics;  Laterality: Right;  . BACK SURGERY    . CANTHOPLASTY Right 02/09/2017   Procedure: RIGHT LATERAL CANTHOPLASTY;  Surgeon: Irene Limbo, MD;  Location: [facility];  Service: Plastics;  Laterality: Right;  . COLONOSCOPY    . ELBOW ARTHROSCOPY  1990   right  . ELBOW SURGERY     right  .  HERNIA REPAIR  1980   inguinal  . HIP CLOSED REDUCTION Right 11/09/2014   Procedure: CLOSED REDUCTION HIP, s/p total hip 8/9;  Surgeon: Ninetta Lights, MD;  Location: [place] [facility];  Service: Orthopedics;  Laterality: Right;  . JOINT REPLACEMENT  2008   knee  . LITHOTRIPSY    . [place] SURGERY     2015  . RECONSTRUCTION OF EYELID    . REVISION TOTAL HIP ARTHROPLASTY Right 01/08/2015  . SHOULDER ARTHROSCOPY  1980   right  . SHOULDER SURGERY     right  . STONE EXTRACTION WITH BASKET  2010  . TOTAL HIP ARTHROPLASTY Right 11/06/2014   Procedure: RIGHT TOTAL HIP ARTHROPLASTY ANTERIOR APPROACH;  Surgeon: Renette Butters, MD;  Location: [facility];  Service: Orthopedics;  Laterality: Right;  . TOTAL KNEE ARTHROPLASTY Left 07/2006   left knee  . TOTAL KNEE ARTHROPLASTY Right 01/08/2014   Procedure: RIGHT TOTAL KNEE ARTHROPLASTY;  Surgeon: Lorn Junes, MD;  Location: [facility];  Service: Orthopedics;  Laterality: Right;    Social History   Socioeconomic History  . Marital status: Married    Spouse name: Not on file  . Number of children: Not on file  . Years of education: Not on file  . Highest education level: Not on file  Social  Needs  . Financial resource strain: Not on file  . Food insecurity - worry: Not on file  . Food insecurity - inability: Not on file  . Transportation needs - medical: Not on file  . Transportation needs - non-medical: Not on file  Occupational History  . Not on file  Tobacco Use  . Smoking status: Never Smoker  . Smokeless tobacco: Never Used  Substance and Sexual Activity  . Alcohol use: Yes    Comment: glass of wine every 3-4 months  . Drug use: No  . Sexual activity: No  Other Topics Concern  . 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    1 son, 1 daughter, both out of state    Current Outpatient Medications on File Prior to Visit  Medication Sig Dispense Refill   . allopurinol (ZYLOPRIM) 300 MG tablet Take 0.5 tablets (150 mg total) by mouth every morning. Take half a tablet (152m) by mouth daily 45 tablet 3  . amLODipine (NORVASC) 10 MG tablet Take 1 tablet (10 mg total) by mouth daily. 90 tablet 3  . aspirin 81 MG tablet Take 81 mg by mouth daily.    .Marland Kitchenatorvastatin (LIPITOR) 10 MG tablet Take 1 tablet (10 mg total) by mouth at bedtime. 90 tablet 3  . Blood Glucose Monitoring Suppl (ONE TOUCH ULTRA 2) w/Device KIT 1 each daily by Does not apply route. 1 each 0  . gemfibrozil (LOPID) 600 MG tablet TAKE 1 TABLET BY MOUTH TWICE DAILY 180 tablet 0  . glucose blood (ONE TOUCH ULTRA TEST) test strip Check blood sugar before and after each meal and as directed. Dx E11.9 200 each 5  . LANTUS SOLOSTAR 100 UNIT/ML Solostar Pen Inject 250 Units into the skin every morning. 25 pen 11  . latanoprost (XALATAN) 0.005 % ophthalmic solution Place 1 drop into both eyes at bedtime.    .Marland Kitchenlosartan (COZAAR) 100 MG tablet Take 1 tablet (100 mg total) by mouth daily. 90 tablet 3  . Multiple Vitamins-Minerals (VITRUM 50+ SENIOR MULTI PO) Take 1 tablet by mouth daily.      No current facility-administered medications on file prior to visit.     Allergies  Allergen Reactions  . Morphine And Related Nausea And Vomiting    Causes nausea & vomiting Causes nausea & vomiting  . Codeine Sulfate Other (See Comments)    intolerant    Family History  Problem Relation Age of Onset  . Hypertension Mother   . Kidney disease Mother   . Hypertension Father   . Diabetes Mellitus II Father   . Colon polyps Father   . Diabetes Mellitus II Brother   . Diabetes Mellitus II Brother   . Cancer - Prostate Brother   . Colon cancer Neg Hx     BP 132/86   Pulse 93   Ht 6' (1.829 m)   Wt 280 lb (127 kg)   SpO2 94%   BMI 37.97 kg/m    Review of Systems He denies hypoglycemia    Objective:   Physical Exam VITAL SIGNS:  See vs page GENERAL: no distress Pulses: foot pulses  are intact bilaterally.   MSK: no deformity of the feet or ankles.  CV: 2+ bilat edema of the legs.   Skin:  no ulcer on the feet or ankles.  normal color and temp on the feet and ankles  Neuro: sensation is intact to touch on the feet and ankles.   Ext: There is bilateral onychomycosis of the toenails.    Lab Results  Component Value Date   HGBA1C 8.3 (H) 06/14/2017      Assessment & Plan:  Insulin-requiring type 2 DM, with renal insuff: improved glycemic control Bell's palsy: new to me.  This is increasing a1c. Please continue the same insulin for now

## 2017-06-14 NOTE — Telephone Encounter (Signed)
Rn call patient that his MR brain shows age related shrinkage of brain, and hardening of arteries. NO new changes or concerning issues with scan.Pt verbalized understanding.

## 2017-06-15 NOTE — H&P (Signed)
Subjective:     Patient ID: [name] is a 77 y.o. male.  HPI  4 months post op right lateral canthoplasty secondary to Bells palsy. Returns as has developed left sided palsy. States this is related to zoster and like his Bells expects lengthy time to recovery. Has developed significant ectropion and exposure on left as result.   Wears corrective lenses, has history glaucoma.  PMH includes DM, last HbA1c 8.8 (09/2016)     Objective:   Physical Exam  Cardiovascular: Normal rate, regular rhythm and normal heart sounds.   Pulmonary/Chest: Effort normal and breath sounds normal.    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 Plan lateral canthoplasty left. Reviewed incisions, sutures, bruising, OP surgery. Patient desires to proceed ASAP. Recommend hold ASA for week prior to surgery.    Irene Limbo, MD [facility] Plastic & Reconstructive Surgery [phone], pin [phone]

## 2017-06-23 ENCOUNTER — Other Ambulatory Visit: Payer: Self-pay

## 2017-06-23 ENCOUNTER — Encounter (HOSPITAL_BASED_OUTPATIENT_CLINIC_OR_DEPARTMENT_OTHER): Payer: Self-pay | Admitting: *Deleted

## 2017-06-24 ENCOUNTER — Encounter (HOSPITAL_BASED_OUTPATIENT_CLINIC_OR_DEPARTMENT_OTHER)
Admission: RE | Admit: 2017-06-24 | Discharge: 2017-06-24 | Disposition: A | Discharge status: Home / Self Care | Payer: PPO | Source: Ambulatory Visit | Attending: Plastic Surgery | Admitting: Plastic Surgery

## 2017-06-24 DIAGNOSIS — R9431 Abnormal electrocardiogram [ECG] [EKG]: Secondary | ICD-10-CM | POA: Diagnosis not present

## 2017-06-24 DIAGNOSIS — I10 Essential (primary) hypertension: Secondary | ICD-10-CM | POA: Diagnosis not present

## 2017-06-24 DIAGNOSIS — E119 Type 2 diabetes mellitus without complications: Secondary | ICD-10-CM | POA: Diagnosis not present

## 2017-06-24 LAB — BASIC METABOLIC PANEL
Anion gap: 10 (ref 5–15)
BUN: 27 mg/dL — ABNORMAL HIGH (ref 6–20)
CO2: 26 mmol/L (ref 22–32)
Calcium: 10.2 mg/dL (ref 8.9–10.3)
Chloride: 102 mmol/L (ref 101–111)
Creatinine, Ser: 1.34 mg/dL — ABNORMAL HIGH (ref 0.61–1.24)
GFR calc Af Amer: 58 mL/min — ABNORMAL LOW (ref >60)
GFR calc non Af Amer: 50 mL/min — ABNORMAL LOW (ref >60)
Glucose, Bld: 233 mg/dL — ABNORMAL HIGH (ref 65–99)
Potassium: 4.6 mmol/L (ref 3.5–5.1)
Sodium: 138 mmol/L (ref 135–145)

## 2017-06-24 NOTE — Pre-Procedure Instructions (Signed)
Lab results of today reviewed by Dr. Collins [place]; pt. OK to come for surgery; will recheck blood sugar DOS.

## 2017-06-24 NOTE — Progress Notes (Signed)
EKG reviewed by Dr. Turk, will proceed with surgery as scheduled. 

## 2017-06-25 DIAGNOSIS — R351 Nocturia: Secondary | ICD-10-CM | POA: Diagnosis not present

## 2017-06-25 DIAGNOSIS — N401 Enlarged prostate with lower urinary tract symptoms: Secondary | ICD-10-CM | POA: Diagnosis not present

## 2017-06-29 ENCOUNTER — Encounter (HOSPITAL_BASED_OUTPATIENT_CLINIC_OR_DEPARTMENT_OTHER)
Admission: RE | Disposition: A | Discharge status: Home / Self Care | Payer: Self-pay | Source: Ambulatory Visit | Attending: Plastic Surgery

## 2017-06-29 ENCOUNTER — Other Ambulatory Visit: Payer: Self-pay

## 2017-06-29 ENCOUNTER — Ambulatory Visit (HOSPITAL_BASED_OUTPATIENT_CLINIC_OR_DEPARTMENT_OTHER): Payer: PPO | Admitting: Anesthesiology

## 2017-06-29 ENCOUNTER — Ambulatory Visit (HOSPITAL_BASED_OUTPATIENT_CLINIC_OR_DEPARTMENT_OTHER)
Admission: RE | Admit: 2017-06-29 | Discharge: 2017-06-29 | Disposition: A | Discharge status: Home / Self Care | Payer: PPO | Source: Ambulatory Visit | Attending: Plastic Surgery | Admitting: Plastic Surgery

## 2017-06-29 ENCOUNTER — Encounter (HOSPITAL_BASED_OUTPATIENT_CLINIC_OR_DEPARTMENT_OTHER): Payer: Self-pay

## 2017-06-29 DIAGNOSIS — B0230 Zoster ocular disease, unspecified: Secondary | ICD-10-CM | POA: Insufficient documentation

## 2017-06-29 DIAGNOSIS — Z79899 Other long term (current) drug therapy: Secondary | ICD-10-CM | POA: Diagnosis not present

## 2017-06-29 DIAGNOSIS — H02155 Paralytic ectropion of left lower eyelid: Secondary | ICD-10-CM | POA: Diagnosis not present

## 2017-06-29 DIAGNOSIS — M1990 Unspecified osteoarthritis, unspecified site: Secondary | ICD-10-CM | POA: Insufficient documentation

## 2017-06-29 DIAGNOSIS — Z794 Long term (current) use of insulin: Secondary | ICD-10-CM | POA: Diagnosis not present

## 2017-06-29 DIAGNOSIS — Z7982 Long term (current) use of aspirin: Secondary | ICD-10-CM | POA: Insufficient documentation

## 2017-06-29 DIAGNOSIS — Z885 Allergy status to narcotic agent status: Secondary | ICD-10-CM | POA: Insufficient documentation

## 2017-06-29 DIAGNOSIS — I10 Essential (primary) hypertension: Secondary | ICD-10-CM | POA: Diagnosis not present

## 2017-06-29 DIAGNOSIS — Z6837 Body mass index (BMI) 37.0-37.9, adult: Secondary | ICD-10-CM | POA: Diagnosis not present

## 2017-06-29 DIAGNOSIS — E119 Type 2 diabetes mellitus without complications: Secondary | ICD-10-CM | POA: Insufficient documentation

## 2017-06-29 DIAGNOSIS — H189 Unspecified disorder of cornea: Secondary | ICD-10-CM | POA: Insufficient documentation

## 2017-06-29 DIAGNOSIS — G510 Bell's palsy: Secondary | ICD-10-CM | POA: Diagnosis not present

## 2017-06-29 DIAGNOSIS — B029 Zoster without complications: Secondary | ICD-10-CM | POA: Diagnosis not present

## 2017-06-29 DIAGNOSIS — K219 Gastro-esophageal reflux disease without esophagitis: Secondary | ICD-10-CM | POA: Insufficient documentation

## 2017-06-29 DIAGNOSIS — H02105 Unspecified ectropion of left lower eyelid: Secondary | ICD-10-CM | POA: Diagnosis not present

## 2017-06-29 DIAGNOSIS — E669 Obesity, unspecified: Secondary | ICD-10-CM | POA: Diagnosis not present

## 2017-06-29 HISTORY — PX: CANTHOPLASTY: SHX1286

## 2017-06-29 LAB — GLUCOSE, CAPILLARY
Glucose-Capillary: 230 mg/dL — ABNORMAL HIGH (ref 65–99)
Glucose-Capillary: 231 mg/dL — ABNORMAL HIGH (ref 65–99)

## 2017-06-29 SURGERY — CANTHOPLASTY
Anesthesia: General | Site: Eye | Laterality: Left

## 2017-06-29 MED ORDER — LIDOCAINE HCL (CARDIAC) 20 MG/ML IV SOLN
INTRAVENOUS | Status: AC
Start: 2017-06-29 — End: ?
  Filled 2017-06-29: qty 5

## 2017-06-29 MED ORDER — CEFAZOLIN SODIUM-DEXTROSE 2-4 GM/100ML-% IV SOLN: 2.0000 g | INTRAVENOUS | Status: DC

## 2017-06-29 MED ORDER — LIDOCAINE-EPINEPHRINE 1 %-1:100000 IJ SOLN
INTRAMUSCULAR | Status: AC
  Filled 2017-06-29: qty 1

## 2017-06-29 MED ORDER — LACTATED RINGERS IV SOLN
INTRAVENOUS | Status: DC
  Administered 2017-06-29 (×2): via INTRAVENOUS

## 2017-06-29 MED ORDER — SCOPOLAMINE 1 MG/3DAYS TD PT72: 1.0000 | MEDICATED_PATCH | Freq: Once | TRANSDERMAL | Status: DC | PRN

## 2017-06-29 MED ORDER — CEFAZOLIN SODIUM-DEXTROSE 1-4 GM/50ML-% IV SOLN
INTRAVENOUS | Status: AC
  Filled 2017-06-29: qty 50

## 2017-06-29 MED ORDER — DEXAMETHASONE SODIUM PHOSPHATE 10 MG/ML IJ SOLN
INTRAMUSCULAR | Status: AC
Start: 2017-06-29 — End: ?
  Filled 2017-06-29: qty 1

## 2017-06-29 MED ORDER — ROCURONIUM BROMIDE 100 MG/10ML IV SOLN: INTRAVENOUS | Status: DC | PRN

## 2017-06-29 MED ORDER — TOBRAMYCIN-DEXAMETHASONE 0.3-0.1 % OP SUSP
OPHTHALMIC | Status: AC
  Filled 2017-06-29: qty 2.5

## 2017-06-29 MED ORDER — DEXTROSE 5 % IV SOLN
INTRAVENOUS | Status: DC | PRN
  Administered 2017-06-29: 3 g via INTRAVENOUS

## 2017-06-29 MED ORDER — CEFAZOLIN SODIUM-DEXTROSE 2-4 GM/100ML-% IV SOLN
INTRAVENOUS | Status: AC
  Filled 2017-06-29: qty 100

## 2017-06-29 MED ORDER — ARTIFICIAL TEARS OPHTHALMIC OINT
TOPICAL_OINTMENT | OPHTHALMIC | Status: AC
  Filled 2017-06-29: qty 3.5

## 2017-06-29 MED ORDER — PROPOFOL 10 MG/ML IV BOLUS
INTRAVENOUS | Status: DC | PRN
  Administered 2017-06-29: 180 mg via INTRAVENOUS

## 2017-06-29 MED ORDER — FENTANYL CITRATE (PF) 100 MCG/2ML IJ SOLN
50.0000 ug | INTRAMUSCULAR | Status: DC | PRN
  Administered 2017-06-29 (×2): 50 ug via INTRAVENOUS

## 2017-06-29 MED ORDER — FENTANYL CITRATE (PF) 100 MCG/2ML IJ SOLN
INTRAMUSCULAR | Status: AC
  Filled 2017-06-29: qty 2

## 2017-06-29 MED ORDER — DEXAMETHASONE SODIUM PHOSPHATE 10 MG/ML IJ SOLN
INTRAMUSCULAR | Status: AC
  Filled 2017-06-29: qty 1

## 2017-06-29 MED ORDER — METOCLOPRAMIDE HCL 5 MG/ML IJ SOLN: 10.0000 mg | Freq: Once | INTRAMUSCULAR | Status: DC | PRN

## 2017-06-29 MED ORDER — TOBRAMYCIN 0.3 % OP OINT
TOPICAL_OINTMENT | OPHTHALMIC | Status: DC | PRN
  Administered 2017-06-29: 1 via OPHTHALMIC

## 2017-06-29 MED ORDER — PROPOFOL 500 MG/50ML IV EMUL
INTRAVENOUS | Status: AC
  Filled 2017-06-29: qty 50

## 2017-06-29 MED ORDER — LIDOCAINE-EPINEPHRINE 1 %-1:100000 IJ SOLN
INTRAMUSCULAR | Status: DC | PRN
  Administered 2017-06-29: 2 mL

## 2017-06-29 MED ORDER — ONDANSETRON HCL 4 MG/2ML IJ SOLN
INTRAMUSCULAR | Status: DC | PRN
  Administered 2017-06-29: 4 mg via INTRAVENOUS

## 2017-06-29 MED ORDER — DEXAMETHASONE SODIUM PHOSPHATE 4 MG/ML IJ SOLN
INTRAMUSCULAR | Status: DC | PRN
  Administered 2017-06-29: 5 mg via INTRAVENOUS

## 2017-06-29 MED ORDER — MEPERIDINE HCL 25 MG/ML IJ SOLN: 6.2500 mg | INTRAMUSCULAR | Status: DC | PRN

## 2017-06-29 MED ORDER — TOBRAMYCIN-DEXAMETHASONE 0.3-0.1 % OP OINT
TOPICAL_OINTMENT | OPHTHALMIC | Status: AC
  Filled 2017-06-29: qty 3.5

## 2017-06-29 MED ORDER — FENTANYL CITRATE (PF) 100 MCG/2ML IJ SOLN: 25.0000 ug | INTRAMUSCULAR | Status: DC | PRN

## 2017-06-29 MED ORDER — BSS IO SOLN
INTRAOCULAR | Status: DC | PRN
  Administered 2017-06-29: 5 mL via INTRAOCULAR

## 2017-06-29 MED ORDER — MIDAZOLAM HCL 2 MG/2ML IJ SOLN: 1.0000 mg | INTRAMUSCULAR | Status: DC | PRN

## 2017-06-29 MED ORDER — ONDANSETRON HCL 4 MG/2ML IJ SOLN
INTRAMUSCULAR | Status: AC
  Filled 2017-06-29: qty 2

## 2017-06-29 MED ORDER — LIDOCAINE HCL (CARDIAC) 10 MG/ML IV SOLN
INTRAVENOUS | Status: DC | PRN
  Administered 2017-06-29: 100 mg via INTRAVENOUS

## 2017-06-29 MED ORDER — BSS IO SOLN
INTRAOCULAR | Status: AC
  Filled 2017-06-29: qty 15

## 2017-06-29 SURGICAL SUPPLY — 42 items
APPLICATOR COTTON TIP 6 STRL (MISCELLANEOUS) IMPLANT
APPLICATOR COTTON TIP 6IN STRL (MISCELLANEOUS)
APPLICATOR DR MATTHEWS STRL (MISCELLANEOUS) IMPLANT
BANDAGE EYE OVAL (MISCELLANEOUS) ×2 IMPLANT
BLADE SURG 15 STRL LF DISP TIS (BLADE) ×1 IMPLANT
BLADE SURG 15 STRL SS (BLADE) ×1
CORD BIPOLAR FORCEPS 12FT (ELECTRODE) IMPLANT
COVER BACK TABLE 60X90IN (DRAPES) ×2 IMPLANT
COVER MAYO STAND STRL (DRAPES) ×2 IMPLANT
DECANTER SPIKE VIAL GLASS SM (MISCELLANEOUS) ×2 IMPLANT
DERMABOND ADVANCED (GAUZE/BANDAGES/DRESSINGS)
DERMABOND ADVANCED .7 DNX12 (GAUZE/BANDAGES/DRESSINGS) IMPLANT
DRAPE U-SHAPE 76X120 STRL (DRAPES) ×2 IMPLANT
ELECT NEEDLE BLADE 2-5/6 (NEEDLE) ×2 IMPLANT
ELECT REM PT RETURN 9FT ADLT (ELECTROSURGICAL) ×2
ELECTRODE REM PT RTRN 9FT ADLT (ELECTROSURGICAL) ×1 IMPLANT
GLOVE BIO SURGEON STRL SZ 6 (GLOVE) ×2 IMPLANT
GLOVE BIOGEL PI IND STRL 7.0 (GLOVE) ×2 IMPLANT
GLOVE BIOGEL PI INDICATOR 7.0 (GLOVE) ×2
GLOVE ECLIPSE 6.5 STRL STRAW (GLOVE) ×2 IMPLANT
GOWN STRL REUS W/ TWL LRG LVL3 (GOWN DISPOSABLE) ×2 IMPLANT
GOWN STRL REUS W/TWL LRG LVL3 (GOWN DISPOSABLE) ×2
NEEDLE HYPO 30GX1 BEV (NEEDLE) ×2 IMPLANT
NEEDLE PRECISIONGLIDE 27X1.5 (NEEDLE) IMPLANT
PACK BASIN DAY SURGERY FS (CUSTOM PROCEDURE TRAY) ×2 IMPLANT
PENCIL BUTTON HOLSTER BLD 10FT (ELECTRODE) ×2 IMPLANT
SHEILD EYE MED CORNL SHD 22X21 (OPHTHALMIC RELATED) ×4
SHIELD EYE MED CORNL SHD 22X21 (OPHTHALMIC RELATED) ×2 IMPLANT
SLEEVE SCD COMPRESS KNEE MED (MISCELLANEOUS) ×2 IMPLANT
STAPLER VISISTAT 35W (STAPLE) ×2 IMPLANT
STRIP CLOSURE SKIN 1/2X4 (GAUZE/BANDAGES/DRESSINGS) IMPLANT
SUT MERSILENE 4 0 P 3 (SUTURE) ×2 IMPLANT
SUT MNCRL 6-0 UNDY P1 1X18 (SUTURE) IMPLANT
SUT MONOCRYL 6-0 P1 1X18 (SUTURE)
SUT PLAIN 5 0 P 3 18 (SUTURE) ×2 IMPLANT
SUT PROLENE 6 0 P 1 18 (SUTURE) IMPLANT
SUT SILK 4 0 P 3 (SUTURE) ×2 IMPLANT
SUT VIC AB 5-0 P-3 18X BRD (SUTURE) IMPLANT
SUT VIC AB 5-0 P3 18 (SUTURE)
SYR CONTROL 10ML LL (SYRINGE) ×2 IMPLANT
TOWEL OR 17X24 6PK STRL BLUE (TOWEL DISPOSABLE) ×4 IMPLANT
TRAY DSU PREP LF (CUSTOM PROCEDURE TRAY) ×2 IMPLANT

## 2017-06-29 NOTE — Anesthesia Procedure Notes (Signed)
Procedure Name: LMA Insertion Date/Time: 06/29/2017 7:35 AM Performed by: Lyndee Leo, CRNA Pre-anesthesia Checklist: Patient identified, Emergency Drugs available, Suction available and Patient being monitored Patient Re-evaluated:Patient Re-evaluated prior to induction Oxygen Delivery Method: Circle system utilized Preoxygenation: Pre-oxygenation with 100% oxygen Induction Type: IV induction Ventilation: Mask ventilation without difficulty LMA: LMA flexible inserted LMA Size: 5.0 Number of attempts: 1 Airway Equipment and Method: Bite block Placement Confirmation: positive ETCO2 Tube secured with: Tape Dental Injury: Teeth and Oropharynx as per pre-operative assessment

## 2017-06-29 NOTE — Op Note (Signed)
Operative Note   DATE OF OPERATION: 4.2.2019  LOCATION: [facility]-outpatient  SURGICAL DIVISION: Plastic Surgery  PREOPERATIVE DIAGNOSES:  1. Left lower lid paralytic ectropion 2. Herpes zoster 3. Exposure keratopathy  POSTOPERATIVE DIAGNOSES:  same  PROCEDURE:  Left lateral canthoplasty  SURGEON: Irene Limbo MD MBA  ASSISTANT: none  ANESTHESIA:  General.   EBL: minimal  COMPLICATIONS: None immediate.   INDICATIONS FOR PROCEDURE:  The patient, [name], is a 77 y.o. male born on [birth year], is here for treatment of left lower lid ectropion that has developed following development shingles. He has history Bells palsy with right lower lid ectropion that has been previously addressed.   Following canthoplasty, able to distract lower eyelid 2 mm from globe.  DESCRIPTION OF PROCEDURE:  The patient's operative site was marked with the patient in the preoperative area. The patientwas taken to the operating room. SCDs were placed and IV antibiotics were given. Ophthalmic lubricant placed in bilateral eyes followed by scleral shields.The patient's operative site was prepped and draped in a sterile fashion. A time out was performed and all information was confirmed to be correct.Local anesthetic infiltrated to perform left infraorbital nerve block. Additional local anesthetic infiltrated over left lateral canthus. Incision made over left lateral orbital wall in natural skin tension line. Orbicularis oculi muscle divided in direction of fibers and dissection carried to periosteum of lateral orbital rim. The inferior limb of lateral canthal tendon was identified and divided. 4-0 mersilene suture placed though canthal tendon and this was secured to periosteum of lateral orbital rim superior to level of pupil. Incision closed with 5-0 plain gut suture. 4-0 silk used to place suture of skin muscle flap of lower lid and secured to periosteum bone. Scleral shields removed and eyes  irrigated with basic salt solution. Tobradex ophthalmic ointment placed in left eye.  The patient was allowed to wake from anesthesia, extubated and taken to the recovery room in satisfactory condition.   SPECIMENS: none  DRAINS: none  Irene Limbo, MD [facility] Plastic & Reconstructive Surgery [phone], pin [phone]

## 2017-06-29 NOTE — Discharge Instructions (Signed)

## 2017-06-29 NOTE — Interval H&P Note (Signed)
History and Physical Interval Note:  06/29/2017 7:07 AM  [name]  has presented today for surgery, with the diagnosis of LEFT LOWER LID ECTROPIA  The various methods of treatment have been discussed with the patient and family. After consideration of risks, benefits and other options for treatment, the patient has consented to  Left lateral canthoplasty as a surgical intervention .  The patient's history has been reviewed, patient examined, no change in status, stable for surgery.  I have reviewed the patient's chart and labs.  Questions were answered to the patient's satisfaction.     Rider Ermis

## 2017-06-29 NOTE — Transfer of Care (Signed)
Immediate Anesthesia Transfer of Care Note  Patient: [name]  Procedure(s) Performed: LEFT LOWER CANTHOPLASTY (Left Eye)  Patient Location: PACU  Anesthesia Type:General  Level of Consciousness: awake, sedated and patient cooperative  Airway & Oxygen Therapy: Patient Spontanous Breathing and Patient connected to face mask oxygen  Post-op Assessment: Report given to RN and Post -op Vital signs reviewed and stable  Post vital signs: Reviewed and stable  Last Vitals:  Vitals Value Taken Time  BP    Temp    Pulse 95 06/29/2017  8:18 AM  Resp 15 06/29/2017  8:18 AM  SpO2 98 % 06/29/2017  8:18 AM  Vitals shown include unvalidated device data.  Last Pain:  Vitals:   06/29/17 0649  TempSrc: Oral  PainSc: 1       Patients Stated Pain Goal: 0 (03/22/48 7530)  Complications: No apparent anesthesia complications

## 2017-06-29 NOTE — Anesthesia Postprocedure Evaluation (Signed)
Anesthesia Post Note  Patient: [name]  Procedure(s) Performed: LEFT LOWER CANTHOPLASTY (Left Eye)     Patient location during evaluation: PACU Anesthesia Type: General Level of consciousness: awake and alert Pain management: pain level controlled Vital Signs Assessment: post-procedure vital signs reviewed and stable Respiratory status: spontaneous breathing, nonlabored ventilation, respiratory function stable and patient connected to nasal cannula oxygen Cardiovascular status: blood pressure returned to baseline and stable Postop Assessment: no apparent nausea or vomiting Anesthetic complications: no    Last Vitals:  Vitals:   06/29/17 0900 06/29/17 0917  BP: 122/69 (!) 158/77  Pulse: 84 86  Resp: 13 14  Temp:  37 C  SpO2: 93% 96%    Last Pain:  Vitals:   06/29/17 0917  TempSrc: Oral  PainSc: 1                  Montez Hageman

## 2017-06-29 NOTE — Anesthesia Preprocedure Evaluation (Signed)
Anesthesia Evaluation  Patient identified by MRN, date of birth, ID band Patient awake    Reviewed: Allergy & Precautions, H&P , NPO status , Patient's Chart, lab work & pertinent test results  History of Anesthesia Complications (+) PONV  Airway Mallampati: II  TM Distance: >3 FB Neck ROM: Full    Dental no notable dental hx. (+) Teeth Intact, Dental Advisory Given   Pulmonary neg pulmonary ROS,    Pulmonary exam normal breath sounds clear to auscultation       Cardiovascular hypertension, Pt. on medications  Rhythm:Regular Rate:Normal     Neuro/Psych negative neurological ROS  negative psych ROS   GI/Hepatic Neg liver ROS, GERD  Controlled,  Endo/Other  diabetes, Type 2, Insulin Dependent, Oral Hypoglycemic Agents  Renal/GU Renal InsufficiencyRenal disease  negative genitourinary   Musculoskeletal  (+) Arthritis , Osteoarthritis,    Abdominal (+) + obese,   Peds  Hematology negative hematology ROS (+)   Anesthesia Other Findings   Reproductive/Obstetrics negative OB ROS                             Anesthesia Physical  Anesthesia Plan  ASA: III  Anesthesia Plan: General   Post-op Pain Management:    Induction: Intravenous  PONV Risk Score and Plan: 3 and Treatment may vary due to age or medical condition and Ondansetron  Airway Management Planned: LMA  Additional Equipment:   Intra-op Plan:   Post-operative Plan:   Informed Consent: I have reviewed the patients History and Physical, chart, labs and discussed the procedure including the risks, benefits and alternatives for the proposed anesthesia with the patient or authorized representative who has indicated his/her understanding and acceptance.   Dental advisory given  Plan Discussed with: CRNA  Anesthesia Plan Comments:         Anesthesia Quick Evaluation

## 2017-06-30 ENCOUNTER — Encounter (HOSPITAL_BASED_OUTPATIENT_CLINIC_OR_DEPARTMENT_OTHER): Payer: Self-pay | Admitting: Plastic Surgery

## 2017-07-07 DIAGNOSIS — H02105 Unspecified ectropion of left lower eyelid: Secondary | ICD-10-CM | POA: Diagnosis not present

## 2017-07-07 DIAGNOSIS — H401111 Primary open-angle glaucoma, right eye, mild stage: Secondary | ICD-10-CM | POA: Diagnosis not present

## 2017-07-07 DIAGNOSIS — H2513 Age-related nuclear cataract, bilateral: Secondary | ICD-10-CM | POA: Diagnosis not present

## 2017-07-07 DIAGNOSIS — D3131 Benign neoplasm of right choroid: Secondary | ICD-10-CM | POA: Diagnosis not present

## 2017-07-07 DIAGNOSIS — H04123 Dry eye syndrome of bilateral lacrimal glands: Secondary | ICD-10-CM | POA: Diagnosis not present

## 2017-07-07 DIAGNOSIS — G510 Bell's palsy: Secondary | ICD-10-CM | POA: Diagnosis not present

## 2017-07-07 DIAGNOSIS — E119 Type 2 diabetes mellitus without complications: Secondary | ICD-10-CM | POA: Diagnosis not present

## 2017-07-07 DIAGNOSIS — H401123 Primary open-angle glaucoma, left eye, severe stage: Secondary | ICD-10-CM | POA: Diagnosis not present

## 2017-07-07 LAB — HM DIABETES EYE EXAM

## 2017-07-13 ENCOUNTER — Other Ambulatory Visit: Payer: Self-pay | Admitting: Family Medicine

## 2017-07-19 ENCOUNTER — Encounter: Payer: Self-pay | Admitting: Neurology

## 2017-07-19 ENCOUNTER — Ambulatory Visit (INDEPENDENT_AMBULATORY_CARE_PROVIDER_SITE_OTHER): Payer: PPO | Admitting: Neurology

## 2017-07-19 ENCOUNTER — Ambulatory Visit: Payer: PPO | Admitting: Neurology

## 2017-07-19 VITALS — BP 130/78 | HR 90 | Ht 72.0 in | Wt 272.8 lb

## 2017-07-19 DIAGNOSIS — G510 Bell's palsy: Secondary | ICD-10-CM | POA: Diagnosis not present

## 2017-07-19 NOTE — Patient Instructions (Addendum)
I had a long discussion the patient and his wife regarding his recurrent facial nerve palsy. He is not shown significant improvement yet. Recommend continue to use artificial tears liberally during the day and Lacri-Lube ointment at night. And do  facial muscle exercises several times a day. Check MRI scan of the brain with and without contrast with thin sections through facial nerve protocol as it has not yet been done.also check Lyme titer and angiotensin-converting enzyme levels. Continue follow-up with ophthalmologist for eyelid surgery as needed. He will return for follow-up in 6 months or call earlier if necessary.

## 2017-07-19 NOTE — Progress Notes (Signed)
PATIENT: [name] DOB: 08-05-1940  REASON FOR VISIT: follow up HISTORY FROM: patient  HISTORY OF PRESENT ILLNESS: Today 07/19/17  [name] is a 77-ear-old male with a history of Bell's palsy.  He returns today for follow-up. He does feel that his symptoms have improved.  he states that he continues to have trouble with closure of the right eye. He states the lower lid is drooping. he has seen his ophthalmologist Dr. Katy Fitch. He was referred to a cosmetic surgeon at [facility]. He is considering surgery to improve the lower lid. Right now he continues to use artificial tears as well as lubrication to protect the eye.  The patient continues to have a slight facial droop on the right side of the mouth. He states that he is able to  hold liquids on that side although on occasion liquids will leak out of the right corner of the mouth. He states that he is talking better. He reports his is been a slow recovery. He returns today for an evaluation.   HISTORY Mr Parcel is a 77 year old pleasant Caucasian male who seen for new office consultation today upon request from Dr. Damita Dunnings. His accompanied by his wife. I have reviewed referral notes from primary physician, imaging films and electronic medical records personally. The patient was visiting [place] to look after his son-in-law who was sick from cancer in May this year when he developed sudden onset of pain in his right temple as well as occipital regions and neck strain notice he was unable to close his right eye and had right facial weakness. He refused to seek medical help there and upon arrival in [place] was eventually seen in the emergency room on 08/18/16. He underwent MRI scan of the brain which I personally reviewed and showed no acute abnormality. The patient Was treated with a two-week course of Valtrex and steroids which he has completed. He still has significant right-sided facial weakness. He is unable to close his eyes. He does  use Systane eyedrops liberally during the day and uses Systane gel at night when sleeping. He denies any alteration of taste. He states that occasionally his bothered by loud sound. He denies any decreased sensation on the right side. He denies any hearing problems or ear pain. He has no prior history of Bell's palsy but does have a history of chickenpox as a child. He has no history of shingles. He denies any tick bite and does not have any pets who have tics. He has a remote history of runs and diplopia in 2016 for which he was seen in the hospital by Dr. 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 His diplopia cleared in 2-3 months. He denies significant headaches, vertigo, gait or balance problems. He does have diabetes and feels his sugars are difficult to control.He does have some redness in his right eye but denies significant pain or dryness of infection. He has not seen an eye doctor. Update 05/11/2017 : Patient is seen today as he called complaining of new complaints of left-sided facial weakness for the last 2 weeks. Patient states that this began insidiously. He complains of pain behind his left ear. He has inability to close his left eye which has become slightly red. He had right-sided Bell's palsy in May 2018 and made gradual improvement with that. He had seen an ophthalmologist and underwent surgery on the retraction of the right lower eyelid which went well. He has been using  artificial tears to keep his left eye moist but he has not seen his primary care physician or tried Valtrex of prednisone. He denies any tick bites and does not have any pets which have tics. He denies any herpes zoster flareup. He had an MRI scan the brain on 08/18/16 but it was without contrast for his Bell's palsy. Previously had a microvascular sixth nerve palsy causing diplopia in June 2017. He denies any significant headaches, blurred vision, diplopia, vertigo, gait or balance problems.   [name] Update 07/19/2017 : returns for follow-up after last visit 2 months ago. He is accompanied by his wife. He states he did not notice much improvement in his left facial weakness. He continues to use artificial tears during the day and Lacri-Lube at night. He saw ophthalmologist who did lower lid elevation surgery which is only partially helped.He finished a course of Valtrex as well as prednisone and noted improvement in his ear pain which went away fairly quickly.the patient was supposed to get an MRI scan of the brain done which had ordered but for unclear reason that has not yet been done. He denies any alteration of taste or hearing loss or hyperacusis.He denies any headaches or any new symptoms. He has no new complaints. REVIEW OF SYSTEMS: Out of a complete 14 system review of symptoms, the patient complains only of the following symptoms, and all other reviewed systems are negative. Activity change, eye discharge, runny nose, cough, leg swelling, frequent waking, snoring, speech difficulty and all other systems negative  ALLERGIES: Allergies  Allergen Reactions  . Morphine And Related Nausea And Vomiting    Causes nausea & vomiting Causes nausea & vomiting  . Codeine Sulfate Other (See Comments)    intolerant    HOME MEDICATIONS: Outpatient Medications Prior to Visit  Medication Sig Dispense Refill  . allopurinol (ZYLOPRIM) 300 MG tablet Take 0.5 tablets (150 mg total) by mouth every morning. Take half a tablet (158m) by mouth daily 45 tablet 3  . amLODipine (NORVASC) 10 MG tablet TAKE 1 TABLET BY MOUTH DAILY 90 tablet 1  . aspirin 81 MG tablet Take 81 mg by mouth daily.    .Marland Kitchenatorvastatin (LIPITOR) 10 MG tablet Take 1 tablet (10 mg total) by mouth at bedtime. 90 tablet 3  . Blood Glucose Monitoring Suppl (ONE TOUCH ULTRA 2) w/Device KIT 1 each daily by Does not apply route. 1 each 0  . gemfibrozil (LOPID) 600 MG tablet TAKE 1 TABLET BY MOUTH TWICE DAILY 180 tablet 0  . glucose blood  (ONE TOUCH ULTRA TEST) test strip Check blood sugar before and after each meal and as directed. Dx E11.9 200 each 5  . LANTUS SOLOSTAR 100 UNIT/ML Solostar Pen Inject 250 Units into the skin every morning. 25 pen 11  . latanoprost (XALATAN) 0.005 % ophthalmic solution Place 1 drop into both eyes at bedtime.    .Marland Kitchenlosartan (COZAAR) 100 MG tablet Take 1 tablet (100 mg total) by mouth daily. 90 tablet 3  . Multiple Vitamins-Minerals (VITRUM 50+ SENIOR MULTI PO) Take 1 tablet by mouth daily.     . meloxicam (MOBIC) 15 MG tablet     . tolterodine (DETROL LA) 4 MG 24 hr capsule      No facility-administered medications prior to visit.     PAST MEDICAL HISTORY: Past Medical History:  Diagnosis Date  . Arthritis    arthirtis all over  . Bell's palsy   . Concussion    multiple- college football player  .  Diabetes mellitus type 2 in obese ([place])   . Frequent urination at night   . GERD (gastroesophageal reflux disease)    occ indigestion  . Glaucoma    both eyes  . Hyperlipemia   . Hypertension   . Kidney stones   . PONV (postoperative nausea and vomiting)   . Primary localized osteoarthritis of right hip   . Primary osteoarthritis of right knee   . Restless leg syndrome   . Right knee DJD   . Spinal stenosis of lumbar region with radiculopathy    s/p L4/L5 transforaminal blocks    PAST SURGICAL HISTORY: Past Surgical History:  Procedure Laterality Date  . ABDOMINAL HERNIA REPAIR    . ANTERIOR HIP REVISION Right 01/08/2015   Procedure: RIGHT ANTERIOR HIP REVISION;  Surgeon: Renette Butters, MD;  Location: [facility];  Service: Orthopedics;  Laterality: Right;  . BACK SURGERY    . CANTHOPLASTY Right 02/09/2017   Procedure: RIGHT LATERAL CANTHOPLASTY;  Surgeon: Irene Limbo, MD;  Location: [facility];  Service: Plastics;  Laterality: Right;  . CANTHOPLASTY Left 06/29/2017   Procedure: LEFT LOWER CANTHOPLASTY;  Surgeon: Irene Limbo, MD;  Location: [facility];  Service: Plastics;  Laterality: Left;  . COLONOSCOPY    . ELBOW ARTHROSCOPY  1990   right  . ELBOW SURGERY     right  . HERNIA REPAIR  1980   inguinal  . HIP CLOSED REDUCTION Right 11/09/2014   Procedure: CLOSED REDUCTION HIP, s/p total hip 8/9;  Surgeon: Ninetta Lights, MD;  Location: [facility];  Service: Orthopedics;  Laterality: Right;  . JOINT REPLACEMENT  2008   knee  . LITHOTRIPSY    . [place] SURGERY     2015  . RECONSTRUCTION OF EYELID    . REVISION TOTAL HIP ARTHROPLASTY Right 01/08/2015  . SHOULDER ARTHROSCOPY  1980   right  . SHOULDER SURGERY     right  . STONE EXTRACTION WITH BASKET  2010  . TOTAL HIP ARTHROPLASTY Right 11/06/2014   Procedure: RIGHT TOTAL HIP ARTHROPLASTY ANTERIOR APPROACH;  Surgeon: Renette Butters, MD;  Location: [facility];  Service: Orthopedics;  Laterality: Right;  . TOTAL KNEE ARTHROPLASTY Left 07/2006   left knee  . TOTAL KNEE ARTHROPLASTY Right 01/08/2014   Procedure: RIGHT TOTAL KNEE ARTHROPLASTY;  Surgeon: Lorn Junes, MD;  Location: [facility];  Service: Orthopedics;  Laterality: Right;    FAMILY HISTORY: Family History  Problem Relation Age of Onset  . Hypertension Mother   . Kidney disease Mother   . Hypertension Father   . Diabetes Mellitus II Father   . Colon polyps Father   . Diabetes Mellitus II Brother   . Diabetes Mellitus II Brother   . Cancer - Prostate Brother   . Colon cancer Neg Hx     SOCIAL HISTORY: Social History   Socioeconomic History  . Marital status: Married    Spouse name: Not on file  . Number of children: Not on file  . Years of education: Not on file  . Highest education level: Not on file  Occupational History  . Not on file  Social Needs  . Financial resource strain: Not on file  . Food insecurity:    Worry: Not on file    Inability: Not on file  . Transportation needs:    Medical: Not on file    Non-medical: Not on file  Tobacco Use  . Smoking status: Never Smoker  .  Smokeless  tobacco: Never Used  Substance and Sexual Activity  . Alcohol use: Yes    Comment: glass of wine every 3-4 months  . Drug use: No  . Sexual activity: Never  Lifestyle  . Physical activity:    Days per week: Not on file    Minutes per session: Not on file  . Stress: Not on file  Relationships  . Social connections:    Talks on phone: Not on file    Gets together: Not on file    Attends religious service: Not on file    Active member of club or organization: Not on file    Attends meetings of clubs or organizations: Not on file    Relationship status: Not on file  . Intimate partner violence:    Fear of current or ex partner: Not on file    Emotionally abused: Not on file    Physically abused: Not on file    Forced sexual activity: Not on file  Other Topics Concern  . 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    1 son, 1 daughter, both out of state      PHYSICAL EXAM  Vitals:   07/19/17 1445  BP: 130/78  Pulse: 90  Weight: 272 lb 12.8 oz (123.7 kg)  Height: 6' (1.829 m)   Body mass index is 37 kg/m.  Generalized: Obese elderly Caucasian male not in distress in no acute distress mild conjunctival injection of the left lower eyelid. Hearing intact bilaterally.  Neurological examination  Mentation: Alert oriented to time, place, history taking. Follows all commands speech and language fluent Cranial nerve II-XII: Pupils were equal round reactive to light. Extraocular movements were full, visual field were full on confrontational test.  Uvula tongue midline. Head turning and shoulder shrug  were normal and symmetric. drooping of the right lower lid. Puff cheeks minimalvweakness on the right. Facial droop in the right corner of the mouth.significant weakness of left half of the face including forehead, inability to close left eyelid drooping of the left lower eyelid. Weakness of  nasolabial fold and puffing cheeks. No hyperacusis or hearing loss. No alteration of taste.  Motor: The motor testing reveals 5 over 5 strength of all 4 extremities. Good symmetric motor tone is noted throughout.  Sensory: Sensory testing is intact to soft touch on all 4 extremities. No evidence of extinction is noted.  Coordination: Cerebellar testing reveals good finger-nose-finger and heel-to-shin bilaterally.  Gait and station:  Patient has a slight limp when he ambulates. Tandem gait not attempted. Reflexes: Deep tendon reflexes are symmetric and normal bilaterally.   DIAGNOSTIC DATA (LABS, IMAGING, TESTING) - I reviewed patient records, labs, notes, testing and imaging myself where available.  Lab Results  Component Value Date   WBC 6.4 08/18/2016   HGB 15.0 08/18/2016   HCT 43.3 08/18/2016   MCV 89.6 08/18/2016   PLT 295 08/18/2016      Component Value Date/Time   NA 138 06/24/2017 1200   K 4.6 06/24/2017 1200   CL 102 06/24/2017 1200   CO2 26 06/24/2017 1200   GLUCOSE 233 (H) 06/24/2017 1200   BUN 27 (H) 06/24/2017 1200   CREATININE 1.34 (H) 06/24/2017 1200   CREATININE 1.44 12/21/2014   CALCIUM 10.2 06/24/2017 1200   PROT 7.7 08/18/2016 1522   ALBUMIN 3.9 08/18/2016 1522   AST 30 08/18/2016  1522   AST 16 12/21/2014   ALT 29 08/18/2016 1522   ALT 15 12/21/2014   ALKPHOS 36 (L) 08/18/2016 1522   BILITOT 0.7 08/18/2016 1522   GFRNONAA 50 (L) 06/24/2017 1200   GFRAA 58 (L) 06/24/2017 1200   Lab Results  Component Value Date   CHOL 181 02/08/2017   HDL 37.10 (L) 02/08/2017   LDLCALC 82 05/09/2014   LDLDIRECT 58.0 02/08/2017   TRIG (H) 02/08/2017    461.0 Triglyceride is over 400; calculations on Lipids are invalid.   CHOLHDL 5 02/08/2017   Lab Results  Component Value Date   HGBA1C 8.3 (H) 06/14/2017      ASSESSMENT AND PLAN 77 y.o. year old male  has a past medical history of Arthritis, Bell's palsy, Concussion, Diabetes mellitus type 2 in obese ([place]),  Frequent urination at night, GERD (gastroesophageal reflux disease), Glaucoma, Hyperlipemia, Hypertension, Kidney stones, PONV (postoperative nausea and vomiting), Primary localized osteoarthritis of right hip, Primary osteoarthritis of right knee, Restless leg syndrome, Right knee DJD, and Spinal stenosis of lumbar region with radiculopathy. here with:   1.h/o right sided  Bell's palsy in May 2018 with now left-sided new Bell's palsy in February 2019 I had a long discussion the patient and his wife regarding his recurrent facial nerve palsy. He is not shown significant improvement yet. Recommend continue to use artificial tears liberally during the day and Lacri-Lube ointment at night. And do  facial muscle exercises several times a day. Check MRI scan of the brain with and without contrast with thin sections through facial nerve protocol as it has not yet been done.also check Lyme titer and angiotensin-converting enzyme levels. Continue follow-up with ophthalmologist for eyelid surgery as needed. Greater than 50% time during this 25 minute visit was spent on counseling and coordination of care about his recurrent Bell's palsy and plan treatment and answered questions.He will return for follow-up in 6 months or call earlier if necessary.    Antony Contras, MD 07/19/2017, 3:36 PM [facility] [address], [place], [place] [address] [phone]

## 2017-07-20 ENCOUNTER — Telehealth: Payer: Self-pay | Admitting: Neurology

## 2017-07-20 LAB — ANGIOTENSIN CONVERTING ENZYME: Angio Convert Enzyme: 37 U/L (ref 14–82)

## 2017-07-20 LAB — B. BURGDORFI ANTIBODIES: Lyme IgG/IgM Ab: <0.91 {ISR} (ref 0.00–0.90)

## 2017-07-20 NOTE — Telephone Encounter (Signed)
Health team auth: NPR via their website order faxed to [facility]. They will contact the patient to schedule for open MRI.

## 2017-07-21 DIAGNOSIS — G510 Bell's palsy: Secondary | ICD-10-CM | POA: Diagnosis not present

## 2017-07-21 DIAGNOSIS — L989 Disorder of the skin and subcutaneous tissue, unspecified: Secondary | ICD-10-CM | POA: Diagnosis not present

## 2017-07-21 DIAGNOSIS — D098 Carcinoma in situ of other specified sites: Secondary | ICD-10-CM | POA: Diagnosis not present

## 2017-07-21 DIAGNOSIS — Z9889 Other specified postprocedural states: Secondary | ICD-10-CM | POA: Diagnosis not present

## 2017-07-21 DIAGNOSIS — H02105 Unspecified ectropion of left lower eyelid: Secondary | ICD-10-CM | POA: Diagnosis not present

## 2017-07-21 DIAGNOSIS — D0439 Carcinoma in situ of skin of other parts of face: Secondary | ICD-10-CM | POA: Diagnosis not present

## 2017-07-21 DIAGNOSIS — H189 Unspecified disorder of cornea: Secondary | ICD-10-CM | POA: Diagnosis not present

## 2017-07-23 ENCOUNTER — Telehealth: Payer: Self-pay | Admitting: Neurology

## 2017-07-23 NOTE — Telephone Encounter (Signed)
I gave the CD and the report to Dr. Leonie Man.

## 2017-07-23 NOTE — Telephone Encounter (Signed)
I was under the impression that I ordered MRI scan in February but it never got done. If indeed it was done I would like to look at the images and the report prior to making a decision about whether a new MRI is needed or not.

## 2017-07-23 NOTE — Telephone Encounter (Signed)
Larene Beach with [facility] emailed me and informed me " This is what patient told scheduling and did not want to have scan:  SEE BELOW   "Spoke with patient who was unsure why scan was ordered. Per patient, Dr. Leonie Man was unable to receive images from last MRI, that was completed in feb. 2019. If those images are still needed please reach out to [facility] [phone] to request those images. If patient is in need of another scan please reach out to the patient, so that we may continue to schedule. Thank you!"  The patient did have his MRI at [facility] on 05/14/17 and on 05/28/17 Katrina reached out to the patient to give him his results.Larene Beach also faxed me another copy of the imaging results from that day.

## 2017-07-23 NOTE — Telephone Encounter (Signed)
DR.Sethi reviewed the disc from 04/2017 scan.

## 2017-07-23 NOTE — Telephone Encounter (Signed)
error 

## 2017-07-23 NOTE — Telephone Encounter (Signed)
I call patient 05/28/2017 to give results. Its documented in epic.  07/19/2017 Pt was seen by DR. Sethi for another follow up appt in the office on bells palsy, and follow up on previous scans. Dr. Leonie Man order another MRI brain, and its documented in the note he spoke with pt about ordering another MRI.

## 2017-07-27 NOTE — Telephone Encounter (Signed)
Noted  

## 2017-07-27 NOTE — Telephone Encounter (Signed)
I have reviewed the disc and images. No need to repeat another MRI at the present time

## 2017-07-28 ENCOUNTER — Telehealth: Payer: Self-pay

## 2017-07-28 NOTE — Telephone Encounter (Signed)
-----   Message from Garvin Fila, MD sent at 07/23/2017 11:17 AM EDT ----- [name] inform the patient that blood test for Lyme's disease and sarcoidosis were both negative

## 2017-07-28 NOTE — Telephone Encounter (Signed)
Notes recorded by Marval Regal, RN on 07/28/2017 at 1:45 PM EDT Rn call patients wife because pt was not at home. The wife is on dpr form. Rn stated the lyme disease,and sarcoidosis were both negative. Pts wife verbalized understanding. ------

## 2017-08-04 ENCOUNTER — Encounter: Payer: Self-pay | Admitting: Family Medicine

## 2017-08-10 ENCOUNTER — Other Ambulatory Visit: Payer: Self-pay | Admitting: Family Medicine

## 2017-08-10 NOTE — Telephone Encounter (Signed)
Electronic refill request Last office visit 06/10/17 See medication change Confirm dose and directions

## 2017-08-11 NOTE — Telephone Encounter (Signed)
Sent. Thanks.   

## 2017-08-16 ENCOUNTER — Ambulatory Visit: Payer: PPO | Admitting: Endocrinology

## 2017-08-16 DIAGNOSIS — Z0289 Encounter for other administrative examinations: Secondary | ICD-10-CM

## 2017-09-03 ENCOUNTER — Other Ambulatory Visit: Payer: Self-pay | Admitting: Family Medicine

## 2017-09-13 ENCOUNTER — Telehealth: Payer: Self-pay | Admitting: Endocrinology

## 2017-09-13 ENCOUNTER — Other Ambulatory Visit: Payer: Self-pay

## 2017-09-13 MED ORDER — LANTUS SOLOSTAR 100 UNIT/ML ~~LOC~~ SOPN: 250.0000 [IU] | PEN_INJECTOR | SUBCUTANEOUS | 11 refills | Status: DC

## 2017-09-13 NOTE — Telephone Encounter (Signed)
LANTUS SOLOSTAR 100 UNIT/ML Solostar Pen  Patient needs insulin sent into the pharmacy      [place], [address]

## 2017-09-13 NOTE — Telephone Encounter (Signed)
I have sent to patient's requested pharmacy.  

## 2017-09-17 DIAGNOSIS — L570 Actinic keratosis: Secondary | ICD-10-CM | POA: Diagnosis not present

## 2017-09-17 DIAGNOSIS — C44319 Basal cell carcinoma of skin of other parts of face: Secondary | ICD-10-CM | POA: Diagnosis not present

## 2017-09-17 DIAGNOSIS — D1801 Hemangioma of skin and subcutaneous tissue: Secondary | ICD-10-CM | POA: Diagnosis not present

## 2017-09-17 DIAGNOSIS — L821 Other seborrheic keratosis: Secondary | ICD-10-CM | POA: Diagnosis not present

## 2017-09-27 ENCOUNTER — Other Ambulatory Visit: Payer: Self-pay | Admitting: Family Medicine

## 2017-09-27 DIAGNOSIS — I779 Disorder of arteries and arterioles, unspecified: Secondary | ICD-10-CM

## 2017-09-27 NOTE — Progress Notes (Signed)
Call pt.  Due for f/u CT chest regarding pulmonary nodules and aortic dilation.  He is going to need labs done ahead of time so we can see if he can get contrast with the CT.  He has an appointment scheduled on July 16 here.  We can do labs at the visit.  He will just need the CT to be scheduled at some point after that. Thanks.

## 2017-09-29 DIAGNOSIS — C44319 Basal cell carcinoma of skin of other parts of face: Secondary | ICD-10-CM | POA: Diagnosis not present

## 2017-09-29 DIAGNOSIS — Z85828 Personal history of other malignant neoplasm of skin: Secondary | ICD-10-CM | POA: Diagnosis not present

## 2017-10-04 IMAGING — CR DG CHEST 2V
2 series · 2 of 2 positions shown · non-contrast
Comparison: 11/28/2014

CLINICAL DATA: Upper respiratory infection, possible pneumonia

EXAM:
CHEST  2 VIEW

[w chest lat]
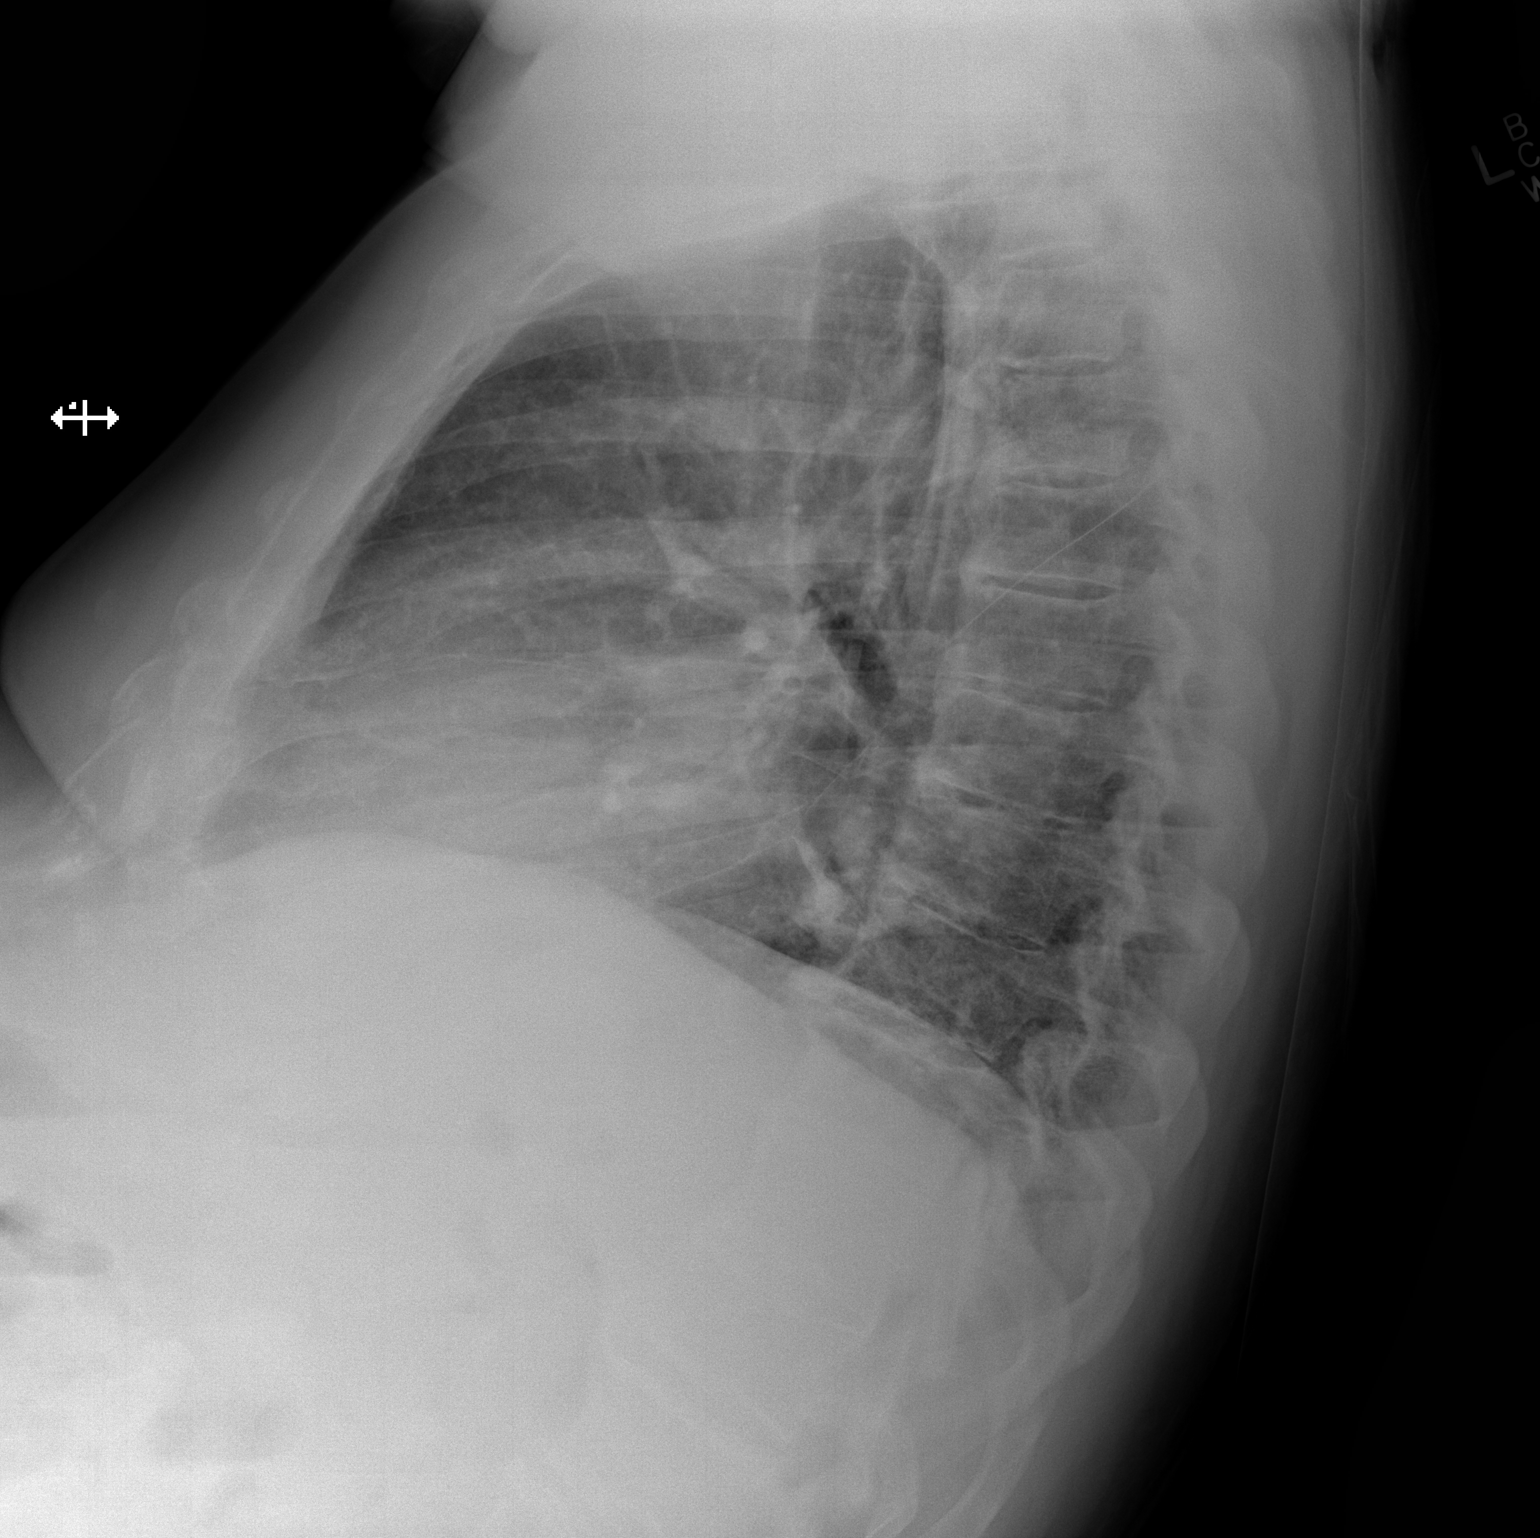

[x chest ap]
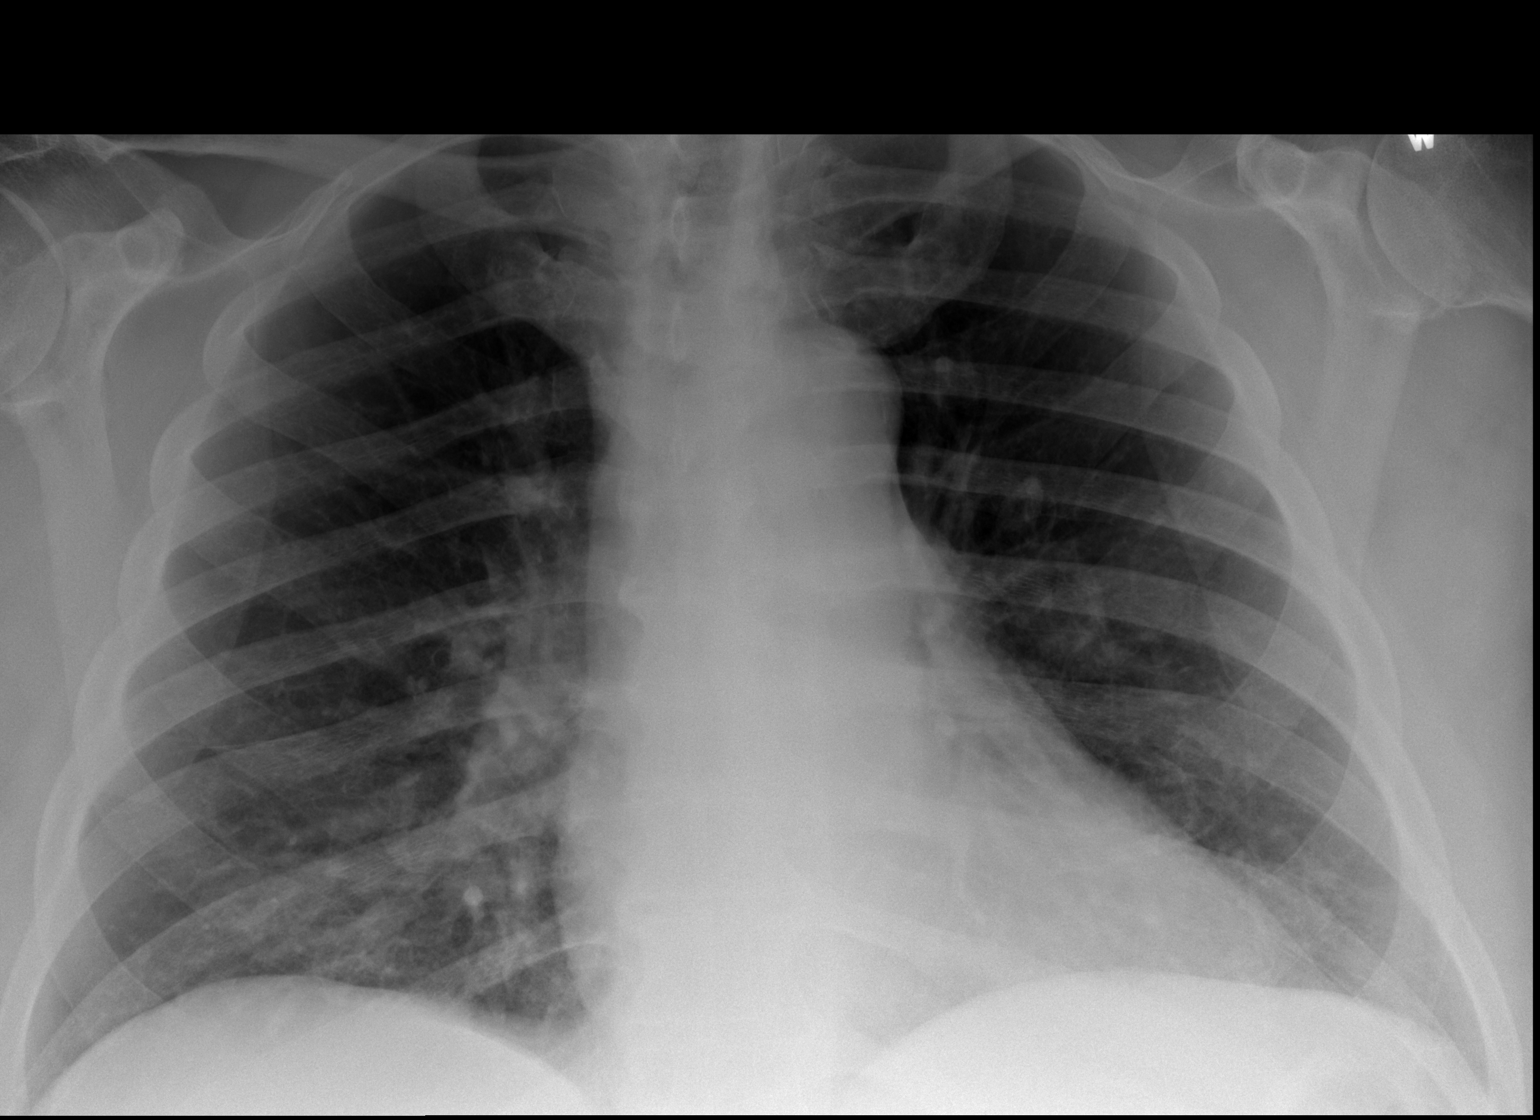

[2 of 2 positions shown; findings below may reference images not displayed]

FINDINGS: Cardiomediastinal silhouette is stable. No infiltrate or pulmonary
edema. Mild perihilar increased bronchial markings without focal
consolidation. Degenerative changes thoracic spine.
IMPRESSION: No infiltrate or pulmonary edema. Mild perihilar increased bronchial
markings without focal consolidation.

## 2017-10-04 IMAGING — CT CT ANGIO CHEST
3 of 6 series · 14 of 36 positions shown · IV contrast (ISOVUE 370)
Comparison: Chest radiographs obtained earlier today.

CLINICAL DATA: Weakness. Frequent falling last night. Productive
cough. Clinical concern for pulmonary embolism.

EXAM:
CT ANGIOGRAPHY CHEST WITH CONTRAST
TECHNIQUE: Multidetector CT imaging of the chest was performed using the
standard protocol during bolus administration of intravenous
contrast. Multiplanar CT image reconstructions and MIPs were
obtained to evaluate the vascular anatomy.
CONTRAST:  80 cc Isovue 370

[Series 4: pe st · axial · 0.95mm/px · z∈[-134,+70]mm · 5 of 104 slices shown]
[im 18/104  lung]
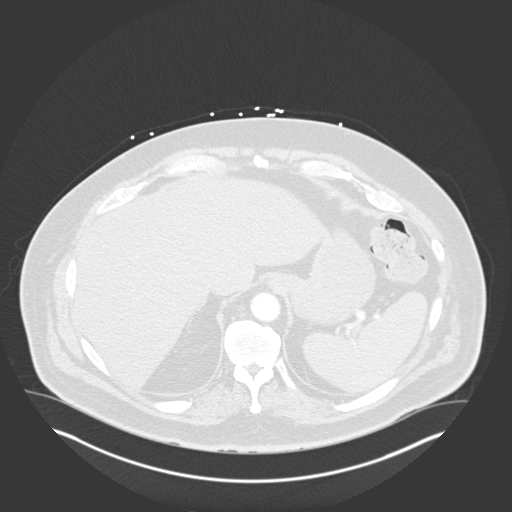
[im 35/104  mediastinal]
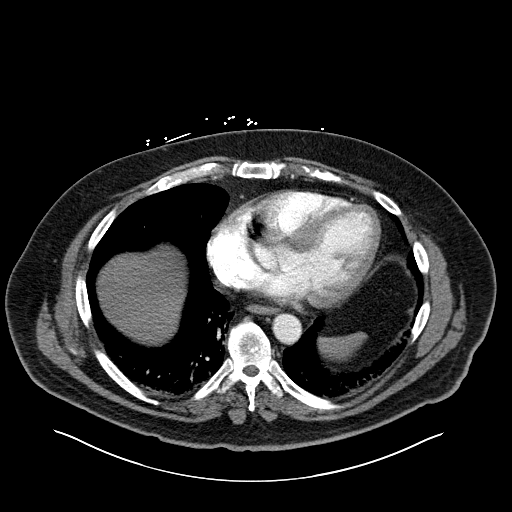
[im 52/104  lung]
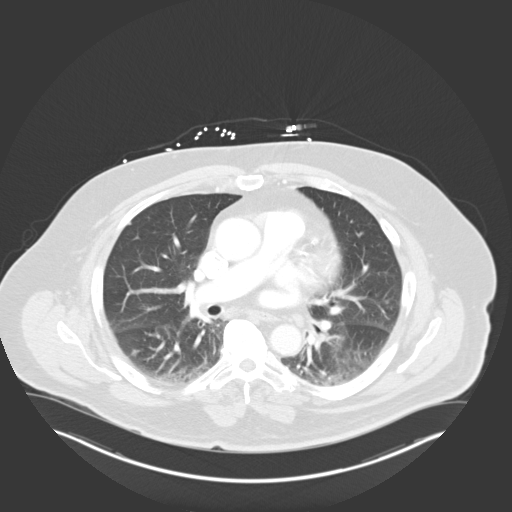
[im 69/104  mediastinal]
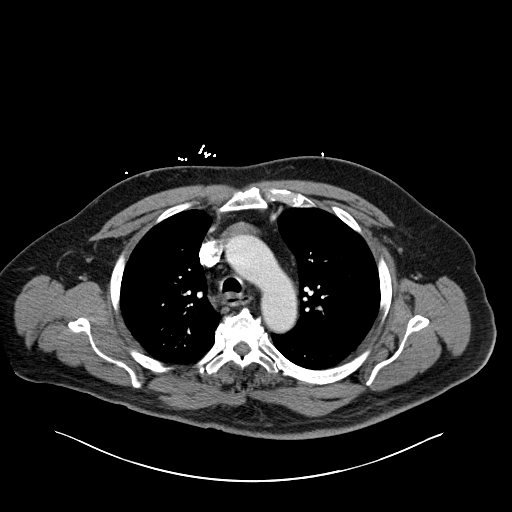
[im 86/104  lung]
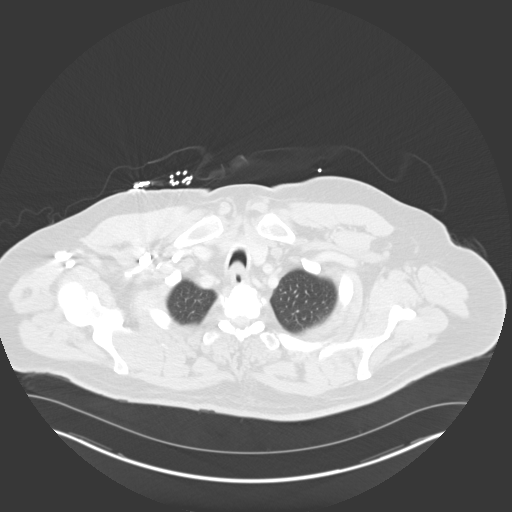

[Series 5: coronal mpr · coronal · 0.61mm/px · 1 of 165 slices shown]
[im 83/165  mediastinal]
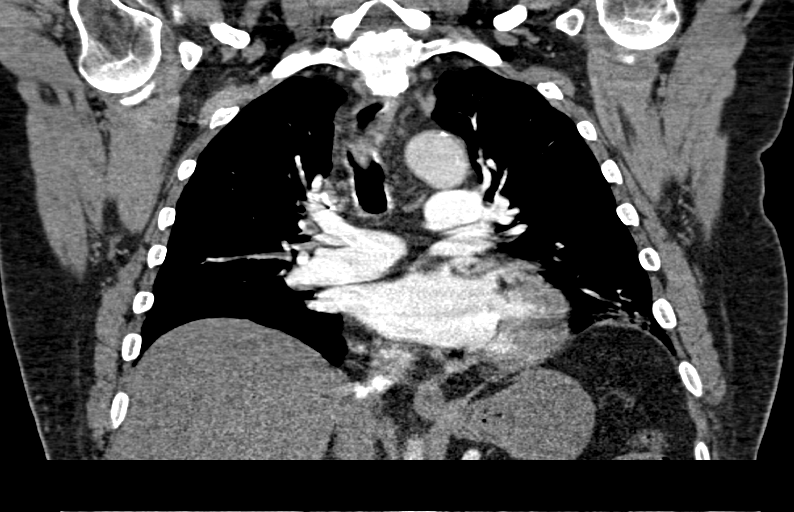

[Series 10: thins for pacs · axial · 0.95mm/px · z∈[-158,+70]mm · 8 of 286 slices shown]
[im 29/286  lung]
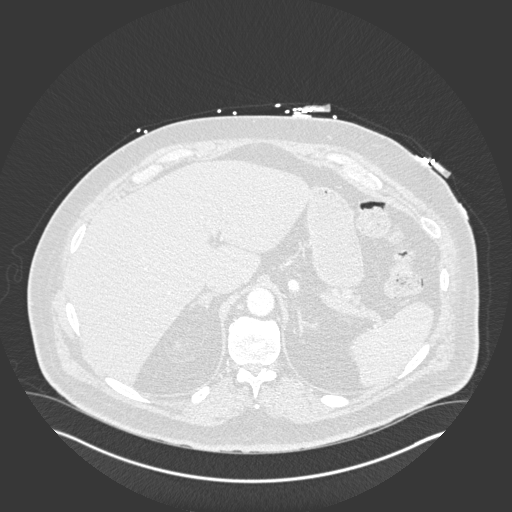
[im 58/286  lung]
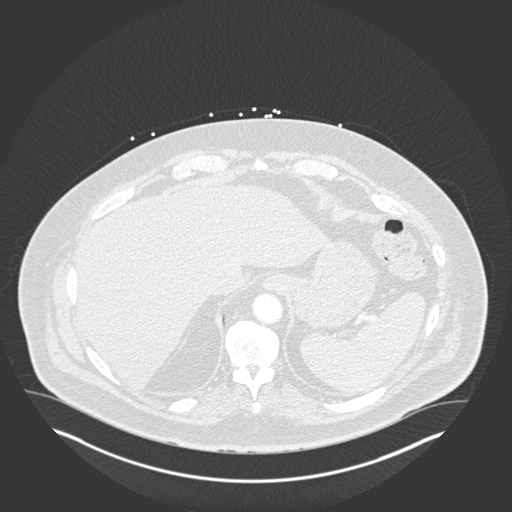
[im 86/286  lung]
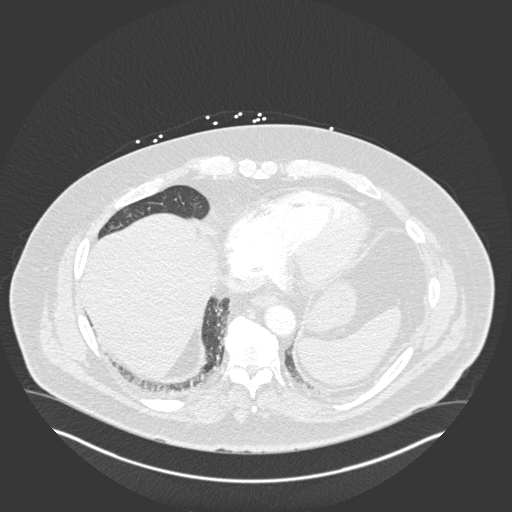
[im 129/286  lung]
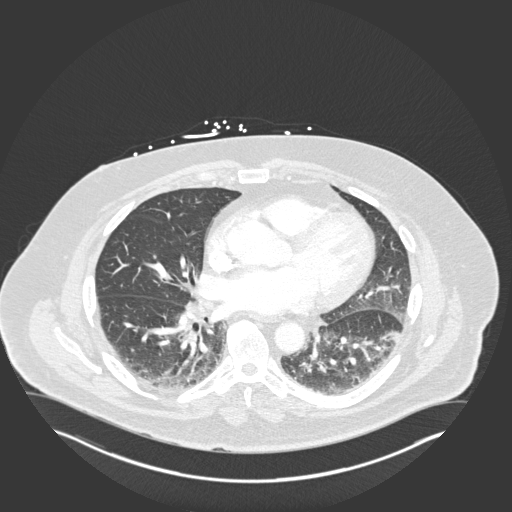
[im 157/286  lung]
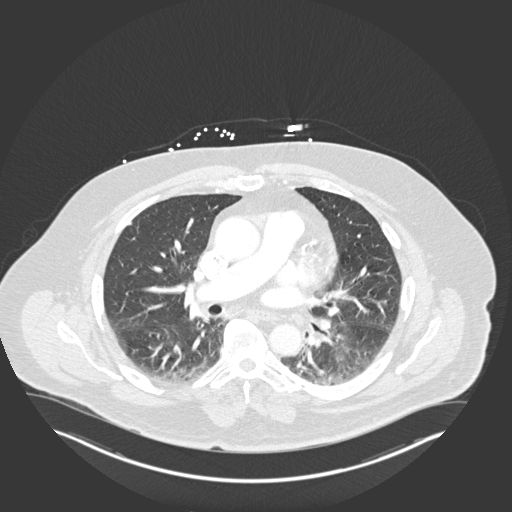
[im 200/286  lung]
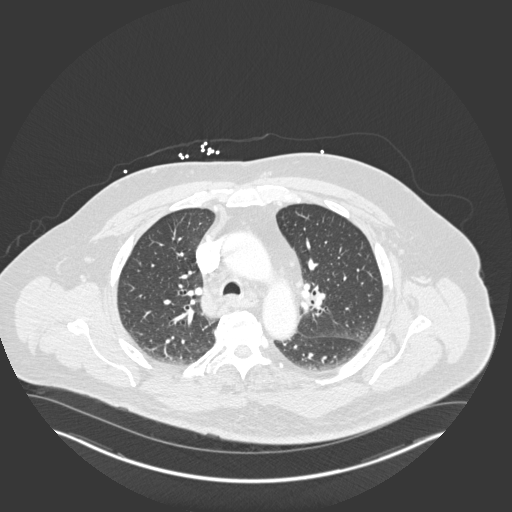
[im 229/286  lung]
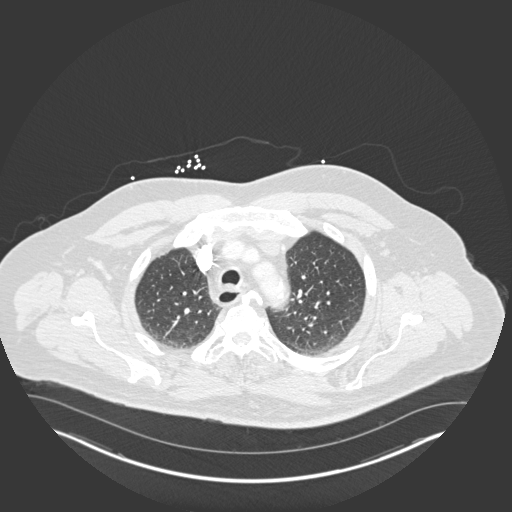
[im 257/286  lung]
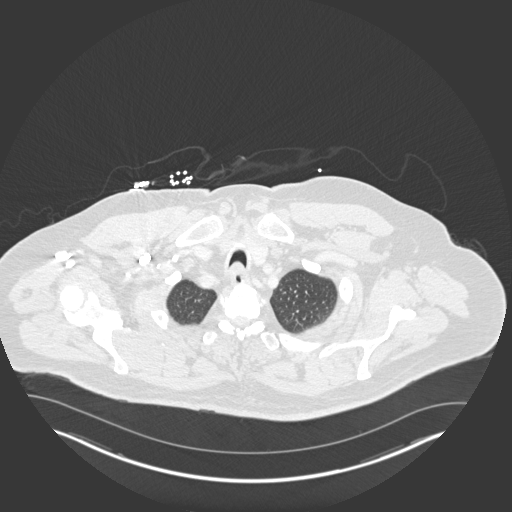

[14 of 36 positions shown; findings below may reference images not displayed]

FINDINGS: Cardiovascular: Satisfactory opacification of the pulmonary arteries
to the segmental level. No evidence of pulmonary embolism. Normal
heart size. No pericardial effusion.

Mediastinum/Nodes: No enlarged mediastinal, hilar, or axillary lymph
nodes. Thyroid gland, trachea, and esophagus demonstrate no
significant findings.

Lungs/Pleura: 4 mm nodule in the lingula on image number 52 of
series 11. 7 mm nodule in the right lower lobe on image number 46 of
series 11, adjacent to accessory fissure. Small nodule adjacent to
the minor fissure on coronal image number 42. Two additional small
subpleural nodules on the right. Minimal bilateral dependent
atelectasis. No pleural fluid.

Upper Abdomen: Unremarkable.

Musculoskeletal: Thoracic spine degenerative changes.

Review of the MIP images confirms the above findings.
IMPRESSION: 1. No pulmonary emboli.
2. Small subcentimeter nodules in both lungs, as described above.
Non-contrast chest CT at 3-6 months is recommended. If the nodules
are stable at time of repeat CT, then future CT at 18-24 months
(from today's scan) is considered optional for low-risk patients,
but is recommended for high-risk patients. This recommendation
follows the consensus statement: Guidelines for Management of
Incidental Pulmonary Nodules Detected on CT Images: From the

## 2017-10-12 ENCOUNTER — Ambulatory Visit (INDEPENDENT_AMBULATORY_CARE_PROVIDER_SITE_OTHER): Payer: PPO | Admitting: Family Medicine

## 2017-10-12 ENCOUNTER — Encounter: Payer: Self-pay | Admitting: Family Medicine

## 2017-10-12 VITALS — BP 154/90 | HR 91 | Temp 98.5°F | Ht 72.0 in | Wt 274.5 lb

## 2017-10-12 DIAGNOSIS — E669 Obesity, unspecified: Secondary | ICD-10-CM | POA: Diagnosis not present

## 2017-10-12 DIAGNOSIS — E1169 Type 2 diabetes mellitus with other specified complication: Secondary | ICD-10-CM | POA: Diagnosis not present

## 2017-10-12 LAB — BASIC METABOLIC PANEL
BUN: 30 mg/dL — ABNORMAL HIGH (ref 6–23)
CO2: 29 mEq/L (ref 19–32)
Calcium: 10.3 mg/dL (ref 8.4–10.5)
Chloride: 101 mEq/L (ref 96–112)
Creatinine, Ser: 1.48 mg/dL (ref 0.40–1.50)
GFR: 49.04 mL/min — ABNORMAL LOW (ref >60.00)
Glucose, Bld: 181 mg/dL — ABNORMAL HIGH (ref 70–99)
Potassium: 3.7 mEq/L (ref 3.5–5.1)
Sodium: 140 mEq/L (ref 135–145)

## 2017-10-12 LAB — HEMOGLOBIN A1C: Hgb A1c MFr Bld: 9.6 % — ABNORMAL HIGH (ref 4.6–6.5)

## 2017-10-12 NOTE — Progress Notes (Signed)
He is putting up with residual Bell's palsy.  He got frustrated and "ate everything in sight" and his weight went up since last OV.  D/w pt.  He wanted to transfer DM2 care back here.  He is getting back to low carb diet.   He is taking 250 units lantus once a day.   We talked about future second opinion re: DM2 if needed.  He agreed with that, if needed.    Diabetes:  Using medications without difficulties: he noted oily skin on the higher dose of insulin Hypoglycemic episodes:no Hyperglycemic episodes: up to ~350, max Feet problems: no Blood Sugars averaging: ~200 unusually, prior to starting diet.  eye exam within last year: yes  PMH and SH reviewed Meds, vitals, and allergies reviewed.   ROS: Per HPI unless specifically indicated in ROS section   GEN: nad, alert and oriented HEENT: mucous membranes moist, R>L eye blink function, meaning more weakness on L side but this improving per patient report.  He has noted L lip droop but some better recently per patient report.  NECK: supple w/o LA CV: rrr. PULM: ctab, no inc wob ABD: soft, +bs EXT: no edema SKIN: no acute rash

## 2017-10-12 NOTE — Patient Instructions (Addendum)
We'll contact you with your lab report. Let me get your labs and see about DM2 options.  We'll go from there.   Take care.  Glad to see you.  Don't change your meds for now.

## 2017-10-13 NOTE — Assessment & Plan Note (Signed)
We talked about options.  He got really frustrated with everything that was going on and that affected his diet and exercise.  This is understandable.  I talked to him about this and I tried to be supportive and reasonable with the conversation.  I am not as worried about what has gone on previously, I am more concerned about him being able to stick with diet and exercise from this point forward since those are the things that he can control.  He agreed.  He felt better about his situation after we talked.  I gave him a copy of the eat right/low carbohydrate diet.  I explained that.  He will use in the meantime to try to make some substitutions.  I will recheck his labs and at that point we can see about medication options and also when to follow-up here regarding diabetes.  I appreciate his effort and he will update me as needed.

## 2017-10-18 ENCOUNTER — Ambulatory Visit (INDEPENDENT_AMBULATORY_CARE_PROVIDER_SITE_OTHER)
Admission: RE | Admit: 2017-10-18 | Discharge: 2017-10-18 | Disposition: A | Discharge status: Home / Self Care | Payer: PPO | Source: Ambulatory Visit | Attending: Family Medicine | Admitting: Family Medicine

## 2017-10-18 DIAGNOSIS — I779 Disorder of arteries and arterioles, unspecified: Secondary | ICD-10-CM | POA: Diagnosis not present

## 2017-10-18 DIAGNOSIS — I712 Thoracic aortic aneurysm, without rupture: Secondary | ICD-10-CM | POA: Diagnosis not present

## 2017-10-18 MED ORDER — IOPAMIDOL (ISOVUE-370) INJECTION 76%
80.0000 mL | Freq: Once | INTRAVENOUS | Status: AC | PRN
  Administered 2017-10-18: 80 mL via INTRAVENOUS

## 2017-10-19 ENCOUNTER — Other Ambulatory Visit: Payer: Self-pay | Admitting: Family Medicine

## 2017-10-19 MED ORDER — SITAGLIPTIN PHOSPHATE 50 MG PO TABS: 25.0000 mg | ORAL_TABLET | Freq: Every day | ORAL | 3 refills | Status: DC

## 2017-10-20 ENCOUNTER — Encounter: Payer: Self-pay | Admitting: Endocrinology

## 2017-10-20 ENCOUNTER — Ambulatory Visit (INDEPENDENT_AMBULATORY_CARE_PROVIDER_SITE_OTHER): Payer: PPO | Admitting: Endocrinology

## 2017-10-20 VITALS — BP 144/92 | HR 100 | Wt 277.2 lb

## 2017-10-20 DIAGNOSIS — E1169 Type 2 diabetes mellitus with other specified complication: Secondary | ICD-10-CM

## 2017-10-20 DIAGNOSIS — E669 Obesity, unspecified: Secondary | ICD-10-CM

## 2017-10-20 MED ORDER — LANTUS SOLOSTAR 100 UNIT/ML ~~LOC~~ SOPN: 300.0000 [IU] | PEN_INJECTOR | SUBCUTANEOUS | 11 refills | Status: DC

## 2017-10-20 MED ORDER — SEMAGLUTIDE(0.25 OR 0.5MG/DOS) 2 MG/1.5ML ~~LOC~~ SOPN: 0.5000 mg | PEN_INJECTOR | SUBCUTANEOUS | 11 refills | Status: DC

## 2017-10-20 NOTE — Patient Instructions (Addendum)
check your blood sugar twice a day.  vary the time of day when you check, between before the 3 meals, and at bedtime.  also check if you have symptoms of your blood sugar being too high or too low.  please keep a record of the readings and bring it to your next appointment here (or you can bring the meter itself).  You can write it on any piece of paper.  please call us sooner if your blood sugar goes below 70, or if you have a lot of readings over 200.  I have sent a prescription to your pharmacy, to increase the insulin.  Please continue the same [place].   Please call or message [place] next week, to tell us how the blood sugar is doing.   Please come back for a follow-up appointment in 2 months.           Diabetes Mellitus and Nutrition When you have diabetes (diabetes mellitus), it is very important to have healthy eating habits because your blood sugar (glucose) levels are greatly affected by what you eat and drink. Eating healthy foods in the appropriate amounts, at about the same times every day, can help you:  Control your blood glucose.  Lower your risk of heart disease.  Improve your blood pressure.  Reach or maintain a healthy weight.  Every person with diabetes is different, and each person has different needs for a meal plan. Your health care provider may recommend that you work with a diet and nutrition specialist (dietitian) to make a meal plan that is best for you. Your meal plan may vary depending on factors such as:  The calories you need.  The medicines you take.  Your weight.  Your blood glucose, blood pressure, and cholesterol levels.  Your activity level.  Other health conditions you have, such as heart or kidney disease.  How do carbohydrates affect me? Carbohydrates affect your blood glucose level more than any other type of food. Eating carbohydrates naturally increases the amount of glucose in your blood. Carbohydrate counting is a method for keeping track  of how many carbohydrates you eat. Counting carbohydrates is important to keep your blood glucose at a healthy level, especially if you use insulin or take certain oral diabetes medicines. It is important to know how many carbohydrates you can safely have in each meal. This is different for every person. Your dietitian can help you calculate how many carbohydrates you should have at each meal and for snack. Foods that contain carbohydrates include:  Bread, cereal, rice, pasta, and crackers.  Potatoes and corn.  Peas, beans, and lentils.  Milk and yogurt.  Fruit and juice.  Desserts, such as cakes, cookies, ice cream, and candy.  How does alcohol affect me? Alcohol can cause a sudden decrease in blood glucose (hypoglycemia), especially if you use insulin or take certain oral diabetes medicines. Hypoglycemia can be a life-threatening condition. Symptoms of hypoglycemia (sleepiness, dizziness, and confusion) are similar to symptoms of having too much alcohol. If your health care provider says that alcohol is safe for you, follow these guidelines:  Limit alcohol intake to no more than 1 drink per day for nonpregnant women and 2 drinks per day for men. One drink equals 12 oz of beer, 5 oz of wine, or 1 oz of hard liquor.  Do not drink on an empty stomach.  Keep yourself hydrated with water, diet soda, or unsweetened iced tea.  Keep in mind that regular soda, juice, and  other mixers may contain a lot of sugar and must be counted as carbohydrates.  What are tips for following this plan? Reading food labels  Start by checking the serving size on the label. The amount of calories, carbohydrates, fats, and other nutrients listed on the label are based on one serving of the food. Many foods contain more than one serving per package.  Check the total grams (g) of carbohydrates in one serving. You can calculate the number of servings of carbohydrates in one serving by dividing the total  carbohydrates by 15. For example, if a food has 30 g of total carbohydrates, it would be equal to 2 servings of carbohydrates.  Check the number of grams (g) of saturated and trans fats in one serving. Choose foods that have low or no amount of these fats.  Check the number of milligrams (mg) of sodium in one serving. Most people should limit total sodium intake to less than 2,300 mg per day.  Always check the nutrition information of foods labeled as "low-fat" or "nonfat". These foods may be higher in added sugar or refined carbohydrates and should be avoided.  Talk to your dietitian to identify your daily goals for nutrients listed on the label. Shopping  Avoid buying canned, premade, or processed foods. These foods tend to be high in fat, sodium, and added sugar.  Shop around the outside edge of the grocery store. This includes fresh fruits and vegetables, bulk grains, fresh meats, and fresh dairy. Cooking  Use low-heat cooking methods, such as baking, instead of high-heat cooking methods like deep frying.  Cook using healthy oils, such as olive, canola, or sunflower oil.  Avoid cooking with butter, cream, or high-fat meats. Meal planning  Eat meals and snacks regularly, preferably at the same times every day. Avoid going long periods of time without eating.  Eat foods high in fiber, such as fresh fruits, vegetables, beans, and whole grains. Talk to your dietitian about how many servings of carbohydrates you can eat at each meal.  Eat 4-6 ounces of lean protein each day, such as lean meat, chicken, fish, eggs, or tofu. 1 ounce is equal to 1 ounce of meat, chicken, or fish, 1 egg, or 1/4 cup of tofu.  Eat some foods each day that contain healthy fats, such as avocado, nuts, seeds, and fish. Lifestyle   Check your blood glucose regularly.  Exercise at least 30 minutes 5 or more days each week, or as told by your health care provider.  Take medicines as told by your health care  provider.  Do not use any products that contain nicotine or tobacco, such as cigarettes and e-cigarettes. If you need help quitting, ask your health care provider.  Work with a Social worker or diabetes educator to identify strategies to manage stress and any emotional and social challenges. What are some questions to ask my health care provider?  Do I need to meet with a diabetes educator?  Do I need to meet with a dietitian?  What number can I call if I have questions?  When are the best times to check my blood glucose? Where to find more information:  American Diabetes Association: diabetes.org/food-and-fitness/food  Academy of Nutrition and Dietetics: PokerClues.dk  [employer] of Diabetes and Digestive and Kidney Diseases (NIH): ContactWire.be Summary  A healthy meal plan will help you control your blood glucose and maintain a healthy lifestyle.  Working with a diet and nutrition specialist (dietitian) can help you make a meal plan  that is best for you.  Keep in mind that carbohydrates and alcohol have immediate effects on your blood glucose levels. It is important to count carbohydrates and to use alcohol carefully. This information is not intended to replace advice given to you by your health care provider. Make sure you discuss any questions you have with your health care provider. Document Released: 12/11/2004 Document Revised: 04/20/2016 Document Reviewed: 04/20/2016 Elsevier Interactive Patient Education  [employer].

## 2017-10-20 NOTE — Progress Notes (Signed)
 Subjective:    Patient ID: [name], male    DOB: 12/29/1940, 77 y.o.   MRN: [id number]  HPI Pt returns for f/u of diabetes mellitus: DM type: Insulin-requiring type 2.   Dx'ed: 2011 Complications: renal insuff Therapy: insulin since 2017, and januvia.    DKA: never Severe hypoglycemia: never.  Pancreatitis: never Pancreatic imaging: normal on 2014 US.  Other: he takes QD insulin, after poor results with multiple daily injections.   Interval history: no cbg record, but states cbg's are in the 200's.   pt states he feels well in general.  Pt says he never misses the insulin.  No recent steroids.   Past Medical History:  Diagnosis Date  . Arthritis    arthirtis all over  . Bell's palsy   . Concussion    multiple- college football player  . Diabetes mellitus type 2 in obese (HCC)   . Frequent urination at night   . GERD (gastroesophageal reflux disease)    occ indigestion  . Glaucoma    both eyes  . Hyperlipemia   . Hypertension   . Kidney stones   . PONV (postoperative nausea and vomiting)   . Primary localized osteoarthritis of right hip   . Primary osteoarthritis of right knee   . Restless leg syndrome   . Right knee DJD   . Spinal stenosis of lumbar region with radiculopathy    s/p L4/L5 transforaminal blocks    Past Surgical History:  Procedure Laterality Date  . ABDOMINAL HERNIA REPAIR    . ANTERIOR HIP REVISION Right 01/08/2015   Procedure: RIGHT ANTERIOR HIP REVISION;  Surgeon: Timothy D Murphy, MD;  Location: [facility];  Service: Orthopedics;  Laterality: Right;  . BACK SURGERY    . CANTHOPLASTY Right 02/09/2017   Procedure: RIGHT LATERAL CANTHOPLASTY;  Surgeon: Thimmappa, Brinda, MD;  Location: [facility];  Service: Plastics;  Laterality: Right;  . CANTHOPLASTY Left 06/29/2017   Procedure: LEFT LOWER CANTHOPLASTY;  Surgeon: Thimmappa, Brinda, MD;  Location: [facility];  Service: Plastics;  Laterality: Left;  . COLONOSCOPY      . ELBOW ARTHROSCOPY  1990   right  . ELBOW SURGERY     right  . HERNIA REPAIR  1980   inguinal  . HIP CLOSED REDUCTION Right 11/09/2014   Procedure: CLOSED REDUCTION HIP, s/p total hip 8/9;  Surgeon: Daniel F Murphy, MD;  Location: [facility] OR;  Service: Orthopedics;  Laterality: Right;  . JOINT REPLACEMENT  2008   knee  . LITHOTRIPSY    . LUMBAR DISC SURGERY     2015  . RECONSTRUCTION OF EYELID    . REVISION TOTAL HIP ARTHROPLASTY Right 01/08/2015  . SHOULDER ARTHROSCOPY  1980   right  . SHOULDER SURGERY     right  . STONE EXTRACTION WITH BASKET  2010  . TOTAL HIP ARTHROPLASTY Right 11/06/2014   Procedure: RIGHT TOTAL HIP ARTHROPLASTY ANTERIOR APPROACH;  Surgeon: Timothy D Murphy, MD;  Location: [facility];  Service: Orthopedics;  Laterality: Right;  . TOTAL KNEE ARTHROPLASTY Left 07/2006   left knee  . TOTAL KNEE ARTHROPLASTY Right 01/08/2014   Procedure: RIGHT TOTAL KNEE ARTHROPLASTY;  Surgeon: Robert A Wainer, MD;  Location: [facility];  Service: Orthopedics;  Laterality: Right;    Social History   Socioeconomic History  . Marital status: Married    Spouse name: Not on file  . Number of children: Not on file  . Years of education: Not on file  .   Highest education level: Not on file  Occupational History  . Not on file  Social Needs  . Financial resource strain: Not on file  . Food insecurity:    Worry: Not on file    Inability: Not on file  . Transportation needs:    Medical: Not on file    Non-medical: Not on file  Tobacco Use  . Smoking status: Never Smoker  . Smokeless tobacco: Never Used  Substance and Sexual Activity  . Alcohol use: Yes    Comment: glass of wine every 3-4 months  . Drug use: No  . Sexual activity: Never  Lifestyle  . Physical activity:    Days per week: Not on file    Minutes per session: Not on file  . Stress: Not on file  Relationships  . Social connections:    Talks on phone: Not on file    Gets together: Not on file    Attends religious  service: Not on file    Active member of club or organization: Not on file    Attends meetings of clubs or organizations: Not on file    Relationship status: Not on file  . Intimate partner violence:    Fear of current or ex partner: Not on file    Emotionally abused: Not on file    Physically abused: Not on file    Forced sexual activity: Not on file  Other Topics Concern  . 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    1 son, 1 daughter, both out of state    Current Outpatient Medications on File Prior to Visit  Medication Sig Dispense Refill  . allopurinol (ZYLOPRIM) 300 MG tablet TAKE 1/2 TABLET BY MOUTH EVERY MORNING DAILY 45 tablet 3  . amLODipine (NORVASC) 10 MG tablet TAKE 1 TABLET BY MOUTH DAILY 90 tablet 1  . aspirin 81 MG tablet Take 81 mg by mouth daily.    . atorvastatin (LIPITOR) 10 MG tablet Take 1 tablet (10 mg total) by mouth at bedtime. 90 tablet 3  . Blood Glucose Monitoring Suppl (ONE TOUCH ULTRA 2) w/Device KIT 1 each daily by Does not apply route. 1 each 0  . CINNAMON PO Take by mouth daily.    . gemfibrozil (LOPID) 600 MG tablet TAKE 1 TABLET BY MOUTH TWICE DAILY 180 tablet 0  . glucose blood (ONE TOUCH ULTRA TEST) test strip Check blood sugar before and after each meal and as directed. Dx E11.9 200 each 5  . latanoprost (XALATAN) 0.005 % ophthalmic solution Place 1 drop into both eyes at bedtime.    . losartan (COZAAR) 100 MG tablet Take 1 tablet (100 mg total) by mouth daily. 90 tablet 3  . meloxicam (MOBIC) 15 MG tablet     . Multiple Vitamins-Minerals (VITRUM 50+ SENIOR MULTI PO) Take 1 tablet by mouth daily.     . tolterodine (DETROL LA) 4 MG 24 hr capsule      No current facility-administered medications on file prior to visit.     Allergies  Allergen Reactions  . Morphine And Related Nausea And Vomiting    Causes nausea & vomiting Causes nausea & vomiting  .  Codeine Sulfate Other (See Comments)    intolerant  . Metformin And Related Other (See Comments)    Gi intolerance, not an allergy    Family History  Problem Relation Age   of Onset  . Hypertension Mother   . Kidney disease Mother   . Hypertension Father   . Diabetes Mellitus II Father   . Colon polyps Father   . Diabetes Mellitus II Brother   . Diabetes Mellitus II Brother   . Cancer - Prostate Brother   . Colon cancer Neg Hx     BP (!) 144/92 (BP Location: Right Arm, Patient Position: Sitting, Cuff Size: Normal)   Pulse 100   Wt 277 lb 3.2 oz (125.7 kg)   PF 93 L/min   BMI 37.60 kg/m    Review of Systems He denies hypoglycemia    Objective:   Physical Exam VITAL SIGNS:  See vs page GENERAL: no distress Pulses: foot pulses are intact bilaterally.   MSK: no deformity of the feet or ankles.  CV: 2+ bilat edema of the legs.   Skin:  no ulcer on the feet or ankles.  normal color and temp on the feet and ankles Neuro: sensation is intact to touch on the feet and ankles.   Ext: There is bilateral onychomycosis of the toenails.   Lab Results  Component Value Date   HGBA1C 9.6 (H) 10/12/2017   Lab Results  Component Value Date   CREATININE 1.48 10/12/2017   BUN 30 (H) 10/12/2017   NA 140 10/12/2017   K 3.7 10/12/2017   CL 101 10/12/2017   CO2 29 10/12/2017       Assessment & Plan:  Insulin-requiring type 2 DM: worse.  Severe insulin resistance: we discussed.  He declines to add Ozempic.  He wants to increase insulin instead Renal insuff: when glycemic control improves, we may learm he needs a faster-acting qd insulin, but we'll continue the same for now.    Patient Instructions  check your blood sugar twice a day.  vary the time of day when you check, between before the 3 meals, and at bedtime.  also check if you have symptoms of your blood sugar being too high or too low.  please keep a record of the readings and bring it to your next appointment here (or you  can bring the meter itself).  You can write it on any piece of paper.  please call us sooner if your blood sugar goes below 70, or if you have a lot of readings over 200.  I have sent a prescription to your pharmacy, to increase the insulin.  Please continue the same [place].   Please call or message [place] next week, to tell us how the blood sugar is doing.   Please come back for a follow-up appointment in 2 months.           Diabetes Mellitus and Nutrition When you have diabetes (diabetes mellitus), it is very important to have healthy eating habits because your blood sugar (glucose) levels are greatly affected by what you eat and drink. Eating healthy foods in the appropriate amounts, at about the same times every day, can help you:  Control your blood glucose.  Lower your risk of heart disease.  Improve your blood pressure.  Reach or maintain a healthy weight.  Every person with diabetes is different, and each person has different needs for a meal plan. Your health care provider may recommend that you work with a diet and nutrition specialist (dietitian) to make a meal plan that is best for you. Your meal plan may vary depending on factors such as:  The calories you need.  The medicines you take.  Your weight.  Your blood glucose, blood pressure, and cholesterol levels.  Your activity level.  Other health conditions you have, such as heart or kidney disease.  How do carbohydrates affect me? Carbohydrates affect your blood glucose level more than any other type of food. Eating carbohydrates naturally increases the amount of glucose in your blood. Carbohydrate counting is a method for keeping track of how many carbohydrates you eat. Counting carbohydrates is important to keep your blood glucose at a healthy level, especially if you use insulin or take certain oral diabetes medicines. It is important to know how many carbohydrates you can safely have in each meal. This is  different for every person. Your dietitian can help you calculate how many carbohydrates you should have at each meal and for snack. Foods that contain carbohydrates include:  Bread, cereal, rice, pasta, and crackers.  Potatoes and corn.  Peas, beans, and lentils.  Milk and yogurt.  Fruit and juice.  Desserts, such as cakes, cookies, ice cream, and candy.  How does alcohol affect me? Alcohol can cause a sudden decrease in blood glucose (hypoglycemia), especially if you use insulin or take certain oral diabetes medicines. Hypoglycemia can be a life-threatening condition. Symptoms of hypoglycemia (sleepiness, dizziness, and confusion) are similar to symptoms of having too much alcohol. If your health care provider says that alcohol is safe for you, follow these guidelines:  Limit alcohol intake to no more than 1 drink per day for nonpregnant women and 2 drinks per day for men. One drink equals 12 oz of beer, 5 oz of wine, or 1 oz of hard liquor.  Do not drink on an empty stomach.  Keep yourself hydrated with water, diet soda, or unsweetened iced tea.  Keep in mind that regular soda, juice, and other mixers may contain a lot of sugar and must be counted as carbohydrates.  What are tips for following this plan? Reading food labels  Start by checking the serving size on the label. The amount of calories, carbohydrates, fats, and other nutrients listed on the label are based on one serving of the food. Many foods contain more than one serving per package.  Check the total grams (g) of carbohydrates in one serving. You can calculate the number of servings of carbohydrates in one serving by dividing the total carbohydrates by 15. For example, if a food has 30 g of total carbohydrates, it would be equal to 2 servings of carbohydrates.  Check the number of grams (g) of saturated and trans fats in one serving. Choose foods that have low or no amount of these fats.  Check the number of  milligrams (mg) of sodium in one serving. Most people should limit total sodium intake to less than 2,300 mg per day.  Always check the nutrition information of foods labeled as "low-fat" or "nonfat". These foods may be higher in added sugar or refined carbohydrates and should be avoided.  Talk to your dietitian to identify your daily goals for nutrients listed on the label. Shopping  Avoid buying canned, premade, or processed foods. These foods tend to be high in fat, sodium, and added sugar.  Shop around the outside edge of the grocery store. This includes fresh fruits and vegetables, bulk grains, fresh meats, and fresh dairy. Cooking  Use low-heat cooking methods, such as baking, instead of high-heat cooking methods like deep frying.  Cook using healthy oils, such as olive, canola, or sunflower oil.  Avoid cooking with butter, cream, or high-fat meats.   Meal planning  Eat meals and snacks regularly, preferably at the same times every day. Avoid going long periods of time without eating.  Eat foods high in fiber, such as fresh fruits, vegetables, beans, and whole grains. Talk to your dietitian about how many servings of carbohydrates you can eat at each meal.  Eat 4-6 ounces of lean protein each day, such as lean meat, chicken, fish, eggs, or tofu. 1 ounce is equal to 1 ounce of meat, chicken, or fish, 1 egg, or 1/4 cup of tofu.  Eat some foods each day that contain healthy fats, such as avocado, nuts, seeds, and fish. Lifestyle   Check your blood glucose regularly.  Exercise at least 30 minutes 5 or more days each week, or as told by your health care provider.  Take medicines as told by your health care provider.  Do not use any products that contain nicotine or tobacco, such as cigarettes and e-cigarettes. If you need help quitting, ask your health care provider.  Work with a counselor or diabetes educator to identify strategies to manage stress and any emotional and social  challenges. What are some questions to ask my health care provider?  Do I need to meet with a diabetes educator?  Do I need to meet with a dietitian?  What number can I call if I have questions?  When are the best times to check my blood glucose? Where to find more information:  American Diabetes Association: diabetes.org/food-and-fitness/food  Academy of Nutrition and Dietetics: www.eatright.org/resources/health/diseases-and-conditions/diabetes  National Institute of Diabetes and Digestive and Kidney Diseases (NIH): www.niddk.nih.gov/health-information/diabetes/overview/diet-eating-physical-activity Summary  A healthy meal plan will help you control your blood glucose and maintain a healthy lifestyle.  Working with a diet and nutrition specialist (dietitian) can help you make a meal plan that is best for you.  Keep in mind that carbohydrates and alcohol have immediate effects on your blood glucose levels. It is important to count carbohydrates and to use alcohol carefully. This information is not intended to replace advice given to you by your health care provider. Make sure you discuss any questions you have with your health care provider. Document Released: 12/11/2004 Document Revised: 04/20/2016 Document Reviewed: 04/20/2016 Elsevier Interactive Patient Education  2018 Elsevier Inc.    

## 2017-12-01 ENCOUNTER — Ambulatory Visit: Payer: PPO | Admitting: Podiatry

## 2017-12-01 ENCOUNTER — Encounter: Payer: Self-pay | Admitting: Podiatry

## 2017-12-01 VITALS — BP 158/80 | HR 87

## 2017-12-01 DIAGNOSIS — M7752 Other enthesopathy of left foot: Secondary | ICD-10-CM | POA: Diagnosis not present

## 2017-12-01 DIAGNOSIS — E1140 Type 2 diabetes mellitus with diabetic neuropathy, unspecified: Secondary | ICD-10-CM

## 2017-12-01 DIAGNOSIS — E1149 Type 2 diabetes mellitus with other diabetic neurological complication: Secondary | ICD-10-CM | POA: Diagnosis not present

## 2017-12-01 DIAGNOSIS — L84 Corns and callosities: Secondary | ICD-10-CM

## 2017-12-01 DIAGNOSIS — M779 Enthesopathy, unspecified: Secondary | ICD-10-CM

## 2017-12-01 MED ORDER — TRIAMCINOLONE ACETONIDE 10 MG/ML IJ SUSP
10.0000 mg | Freq: Once | INTRAMUSCULAR | Status: AC
  Administered 2017-12-01: 10 mg

## 2017-12-01 NOTE — Progress Notes (Signed)
Subjective:   Patient ID: [name], male   DOB: 77 y.o.   MRN: [id number]   HPI Patient presents stating his had a lot of pain on the bottom of his left foot and there is been a callus and it feels like fluid and he has diabetes is not in good control and does not have good feeling.  Patient does not smoke and would like to be more active   Review of Systems  All other systems reviewed and are negative.       Objective:  Physical Exam  Constitutional: He appears well-developed and well-nourished.  Cardiovascular: Intact distal pulses.  Pulmonary/Chest: Effort normal.  Musculoskeletal: Normal range of motion.  Neurological: He is alert.  Skin: Skin is warm.  Nursing note and vitals reviewed.   Vascular status mildly diminished but intact with patient neurologically showing diminishment of sharp dull and vibratory.  Patient has keratotic lesion sub-first metatarsal left that is painful when pressed with fluid buildup around the area that is painful when palpated.  Patient was noted to have good digital perfusion and is well oriented with keratotic tissue formation     Assessment:  At risk diabetic with inflammatory capsulitis first MPJ left plantar with keratotic lesion formation is painful     Plan:  H&P diabetic education rendered to patient.  Today I did careful plantar capsular injection 3 mg Dexasone Kenalog 5 Milgram Xylocaine digits sharp sterile debridement of lesion and discussed long-term diabetic shoes to offload weight off this with recommendation for a deep pocket around the first MPJ of the left foot within his insoles.  Patient will be seen back to recheck again and will have approval for diabetic shoes

## 2017-12-13 ENCOUNTER — Other Ambulatory Visit: Payer: Self-pay | Admitting: Family Medicine

## 2017-12-13 ENCOUNTER — Other Ambulatory Visit: Payer: PPO | Admitting: Orthotics

## 2017-12-14 ENCOUNTER — Other Ambulatory Visit: Payer: Self-pay | Admitting: Family Medicine

## 2018-01-06 DIAGNOSIS — G510 Bell's palsy: Secondary | ICD-10-CM | POA: Diagnosis not present

## 2018-01-06 DIAGNOSIS — H02105 Unspecified ectropion of left lower eyelid: Secondary | ICD-10-CM | POA: Diagnosis not present

## 2018-01-06 DIAGNOSIS — H401123 Primary open-angle glaucoma, left eye, severe stage: Secondary | ICD-10-CM | POA: Diagnosis not present

## 2018-01-06 DIAGNOSIS — H401111 Primary open-angle glaucoma, right eye, mild stage: Secondary | ICD-10-CM | POA: Diagnosis not present

## 2018-01-06 DIAGNOSIS — H04123 Dry eye syndrome of bilateral lacrimal glands: Secondary | ICD-10-CM | POA: Diagnosis not present

## 2018-01-06 DIAGNOSIS — H2513 Age-related nuclear cataract, bilateral: Secondary | ICD-10-CM | POA: Diagnosis not present

## 2018-01-18 ENCOUNTER — Ambulatory Visit (INDEPENDENT_AMBULATORY_CARE_PROVIDER_SITE_OTHER): Payer: PPO | Admitting: Adult Health

## 2018-01-18 ENCOUNTER — Ambulatory Visit: Payer: PPO | Admitting: Adult Health

## 2018-01-18 ENCOUNTER — Encounter: Payer: Self-pay | Admitting: Adult Health

## 2018-01-18 VITALS — BP 153/88 | HR 72 | Ht 72.0 in | Wt 271.5 lb

## 2018-01-18 DIAGNOSIS — E785 Hyperlipidemia, unspecified: Secondary | ICD-10-CM

## 2018-01-18 DIAGNOSIS — I10 Essential (primary) hypertension: Secondary | ICD-10-CM

## 2018-01-18 DIAGNOSIS — E669 Obesity, unspecified: Secondary | ICD-10-CM

## 2018-01-18 DIAGNOSIS — E1169 Type 2 diabetes mellitus with other specified complication: Secondary | ICD-10-CM

## 2018-01-18 DIAGNOSIS — G510 Bell's palsy: Secondary | ICD-10-CM

## 2018-01-18 NOTE — Progress Notes (Signed)
PATIENT: [name] DOB: [birth year]  REASON FOR VISIT: follow up HISTORY FROM: patient  HISTORY OF PRESENT ILLNESS: Today 01/18/18 .  HPI:  Initial visit 10/12/2016  PS: the patient was visiting [place] to look after his son-in-law who was sick from cancer in May this year when he developed sudden onset of pain in his right temple as well as occipital regions and neck strain notice he was unable to close his right eye and had right facial weakness. He refused to seek medical help there and upon arrival in [place] was eventually seen in the emergency room on 08/18/16. He underwent MRI scan of the brain which I personally reviewed and showed no acute abnormality. The patient Was treated with a two-week course of Valtrex and steroids which he has completed. He still has significant right-sided facial weakness. He is unable to close his eyes. He does use Systane eyedrops liberally during the day and uses Systane gel at night when sleeping. He denies any alteration of taste. He states that occasionally his bothered by loud sound. He denies any decreased sensation on the right side. He denies any hearing problems or ear pain. He has no prior history of Bell's palsy but does have a history of chickenpox as a child. He has no history of shingles. He denies any tick bite and does not have any pets who have tics. He has a remote history of runs and diplopia in 2016 for which he was seen in the hospital by Dr. 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 His diplopia cleared in 2-3 months. He denies significant headaches, vertigo, gait or balance problems. He does have diabetes and feels his sugars are difficult to control.He does have some redness in his right eye but denies significant pain or dryness of infection. He has not seen an eye doctor. Update 01/12/2017 MM: [name] is a 77-ear-old male with a history of Bell's palsy.  He returns today for follow-up. He  does feel that his symptoms have improved.  he states that he continues to have trouble with closure of the right eye. He states the lower lid is drooping. he has seen his ophthalmologist Dr. Katy Fitch. He was referred to a cosmetic surgeon at [facility]. He is considering surgery to improve the lower lid. Right now he continues to use artificial tears as well as lubrication to protect the eye.  The patient continues to have a slight facial droop on the right side of the mouth. He states that he is able to  hold liquids on that side although on occasion liquids will leak out of the right corner of the mouth. He states that he is talking better. He reports his is been a slow recovery. He returns today for an evaluation Update 05/11/2017 PS: Patient is seen today as he called complaining of new complaints of left-sided facial weakness for the last 2 weeks. Patient states that this began insidiously. He complains of pain behind his left ear. He has inability to close his left eye which has become slightly red. He had right-sided Bell's palsy in May 2018 and made gradual improvement with that. He had seen an ophthalmologist and underwent surgery on the retraction of the right lower eyelid which went well. He has been using artificial tears to keep his left eye moist but he has not seen his primary care physician or tried Valtrex of prednisone. He denies any tick bites and does not have any  pets which have tics. He denies any herpes zoster flareup. He had an MRI scan the brain on 08/18/16 but it was without contrast for his Bell's palsy. Previously had a microvascular sixth nerve palsy causing diplopia in June 2017. He denies any significant headaches, blurred vision, diplopia, vertigo, gait or balance problems.  [name] Update 07/19/2017 PS: returns for follow-up after last visit 2 months ago. He is accompanied by his wife. He states he did not notice much improvement in his left facial weakness. He continues to use artificial  tears during the day and Lacri-Lube at night. He saw ophthalmologist who did lower lid elevation surgery which is only partially helped.He finished a course of Valtrex as well as prednisone and noted improvement in his ear pain which went away fairly quickly.the patient was supposed to get an MRI scan of the brain done which had ordered but for unclear reason that has not yet been done. He denies any alteration of taste or hearing loss or hyperacusis.He denies any headaches or any new symptoms. He has no new complaints.  Interval history 01/18/2018: Patient returns today for a six-month follow-up visit.  After prior appointment, he did have blood work to rule out Lyme disease and sarcoidosis which were both negative.  On 09/08/2017, patient was seen by Dr. Iran Planas with plastic and reconstructive surgery for follow-up on prior eye surgery.  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  Patient states he does continue to have left facial droop, eyelid droop and "helicopter noise" in his left ear but all have been improving and he does state some days he has no symptoms and other days the symptoms will be present.  He does continue to follow with Dr. Katy Fitch for eye management and routine monitoring.  He does have intermittent headaches that are located behind both of his ears but will take OTC pain reliever and it subsides.  He denies any other worsening neurological symptoms.  No concerns today.    REVIEW OF SYSTEMS: Out of a complete 14 system review of symptoms, the patient complains only of the following symptoms, and all other reviewed systems are negative. Snoring, environmental allergies and itching  ALLERGIES: Allergies  Allergen Reactions  . Morphine And Related Nausea And Vomiting    Causes nausea & vomiting Causes nausea & vomiting  . Codeine  Sulfate Other (See Comments)    intolerant  . Metformin And Related Other (See Comments)    Gi intolerance, not an allergy    HOME MEDICATIONS: Outpatient Medications Prior to Visit  Medication Sig Dispense Refill  . allopurinol (ZYLOPRIM) 300 MG tablet TAKE 1/2 TABLET BY MOUTH EVERY MORNING DAILY 45 tablet 3  . amLODipine (NORVASC) 10 MG tablet TAKE 1 TABLET BY MOUTH DAILY 90 tablet 1  . aspirin 81 MG tablet Take 81 mg by mouth daily.    [name] atorvastatin (LIPITOR) 10 MG tablet TAKE 1 TABLET BY MOUTH AT BEDTIME 90 tablet 3  . Blood Glucose Monitoring Suppl (ONE TOUCH ULTRA 2) w/Device KIT 1 each daily by Does not apply route. 1 each 0  . CINNAMON PO Take by mouth daily.    [name] gemfibrozil (LOPID) 600 MG tablet TAKE 1 TABLET BY MOUTH TWICE DAILY 180 tablet 0  . glucose blood (ONE TOUCH ULTRA TEST) test strip Check blood sugar before and after each meal and as directed. Dx E11.9 200  each 5  . LANTUS SOLOSTAR 100 UNIT/ML Solostar Pen Inject 300 Units into the skin every morning. And pen needles 2/day 35 pen 11  . latanoprost (XALATAN) 0.005 % ophthalmic solution Place 1 drop into both eyes at bedtime.    [name] losartan (COZAAR) 100 MG tablet TAKE 1 TABLET BY MOUTH DAILY 90 tablet 0  . meloxicam (MOBIC) 15 MG tablet     . Multiple Vitamins-Minerals (VITRUM 50+ SENIOR MULTI PO) Take 1 tablet by mouth daily.     . sitaGLIPtin (JANUVIA) 50 MG tablet Take 50 mg by mouth daily.    . dorzolamide-timolol (COSOPT) 22.3-6.8 MG/ML ophthalmic solution     . tolterodine (DETROL LA) 4 MG 24 hr capsule      No facility-administered medications prior to visit.     PAST MEDICAL HISTORY: Past Medical History:  Diagnosis Date  . Arthritis    arthirtis all over  . Bell's palsy   . Concussion    multiple- college football player  . Diabetes mellitus type 2 in obese ([place])   . Frequent urination at night   . GERD (gastroesophageal reflux disease)    occ indigestion  . Glaucoma    both eyes  . Hyperlipemia     . Hypertension   . Kidney stones   . PONV (postoperative nausea and vomiting)   . Primary localized osteoarthritis of right hip   . Primary osteoarthritis of right knee   . Restless leg syndrome   . Right knee DJD   . Spinal stenosis of lumbar region with radiculopathy    s/p L4/L5 transforaminal blocks    PAST SURGICAL HISTORY: Past Surgical History:  Procedure Laterality Date  . ABDOMINAL HERNIA REPAIR    . ANTERIOR HIP REVISION Right 01/08/2015   Procedure: RIGHT ANTERIOR HIP REVISION;  Surgeon: Renette Butters, MD;  Location: [place];  Service: Orthopedics;  Laterality: Right;  . BACK SURGERY    . CANTHOPLASTY Right 02/09/2017   Procedure: RIGHT LATERAL CANTHOPLASTY;  Surgeon: Irene Limbo, MD;  Location: [place];  Service: Plastics;  Laterality: Right;  . CANTHOPLASTY Left 06/29/2017   Procedure: LEFT LOWER CANTHOPLASTY;  Surgeon: Irene Limbo, MD;  Location: [place];  Service: Plastics;  Laterality: Left;  . COLONOSCOPY    . ELBOW ARTHROSCOPY  1990   right  . ELBOW SURGERY     right  . HERNIA REPAIR  1980   inguinal  . HIP CLOSED REDUCTION Right 11/09/2014   Procedure: CLOSED REDUCTION HIP, s/p total hip 8/9;  Surgeon: Ninetta Lights, MD;  Location: [place];  Service: Orthopedics;  Laterality: Right;  . JOINT REPLACEMENT  2008   knee  . LITHOTRIPSY    . [place] SURGERY     2015  . RECONSTRUCTION OF EYELID    . REVISION TOTAL HIP ARTHROPLASTY Right 01/08/2015  . SHOULDER ARTHROSCOPY  1980   right  . SHOULDER SURGERY     right  . STONE EXTRACTION WITH BASKET  2010  . TOTAL HIP ARTHROPLASTY Right 11/06/2014   Procedure: RIGHT TOTAL HIP ARTHROPLASTY ANTERIOR APPROACH;  Surgeon: Renette Butters, MD;  Location: [place];  Service: Orthopedics;  Laterality: Right;  . TOTAL KNEE ARTHROPLASTY Left 07/2006   left knee  . TOTAL KNEE ARTHROPLASTY Right 01/08/2014   Procedure: RIGHT TOTAL KNEE ARTHROPLASTY;  Surgeon: Lorn Junes,  MD;  Location: [place];  Service: Orthopedics;  Laterality: Right;    FAMILY HISTORY: Family History  Problem  Relation Age of Onset  . Hypertension Mother   . Kidney disease Mother   . Hypertension Father   . Diabetes Mellitus II Father   . Colon polyps Father   . Diabetes Mellitus II Brother   . Diabetes Mellitus II Brother   . Cancer - Prostate Brother   . Colon cancer Neg Hx     SOCIAL HISTORY: Social History   Socioeconomic History  . Marital status: Married    Spouse name: Not on file  . Number of children: Not on file  . Years of education: Not on file  . Highest education level: Not on file  Occupational History  . Not on file  Social Needs  . Financial resource strain: Not on file  . Food insecurity:    Worry: Not on file    Inability: Not on file  . Transportation needs:    Medical: Not on file    Non-medical: Not on file  Tobacco Use  . Smoking status: Never Smoker  . Smokeless tobacco: Never Used  Substance and Sexual Activity  . Alcohol use: Yes    Comment: glass of wine every 3-4 months  . Drug use: No  . Sexual activity: Never  Lifestyle  . Physical activity:    Days per week: Not on file    Minutes per session: Not on file  . Stress: Not on file  Relationships  . Social connections:    Talks on phone: Not on file    Gets together: Not on file    Attends religious service: Not on file    Active member of club or organization: Not on file    Attends meetings of clubs or organizations: Not on file    Relationship status: Not on file  . Intimate partner violence:    Fear of current or ex partner: Not on file    Emotionally abused: Not on file    Physically abused: Not on file    Forced sexual activity: Not on file  Other Topics Concern  . 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    1 son, 1 daughter, both out of state      PHYSICAL EXAM  Vitals:    01/18/18 1405  BP: (!) 153/88  Pulse: 72  Weight: 271 lb 8 oz (123.2 kg)  Height: 6' (1.829 m)   Body mass index is 36.82 kg/m.  Generalized: Obese elderly Caucasian male not in distress in no acute distress mild conjunctival injection of the left lower eyelid. Hearing intact bilaterally.  Neurological examination  Mentation: Alert oriented to time, place, history taking. Follows all commands speech and language fluent Cranial nerve II-XII: Pupils were equal round reactive to light. Extraocular movements were full, visual field were full on confrontational test.  Uvula tongue midline. Head turning and shoulder shrug  were normal and symmetric. 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 Mild left facial weakness including forehead, drooping of the left lower eyelid. Weakness of nasolabial fold and puffing cheeks. No hyperacusis or hearing loss. No alteration of taste.  Motor: The motor testing reveals 5 over 5 strength of all 4 extremities. Good symmetric motor tone is noted throughout.  Sensory: Sensory testing is intact to soft touch on all 4 extremities. No evidence of extinction is noted.  Coordination: Cerebellar testing reveals good finger-nose-finger and heel-to-shin bilaterally.  Gait and station:  Patient has a slight limp when  he ambulates. Tandem gait not attempted. Reflexes: Deep tendon reflexes are symmetric and normal bilaterally.   DIAGNOSTIC DATA (LABS, IMAGING, TESTING) - I reviewed patient records, labs, notes, testing and imaging myself where available.  Lab Results  Component Value Date   WBC 6.4 08/18/2016   HGB 15.0 08/18/2016   HCT 43.3 08/18/2016   MCV 89.6 08/18/2016   PLT 295 08/18/2016      Component Value Date/Time   NA 140 10/12/2017 0842   K 3.7 10/12/2017 0842   CL 101 10/12/2017 0842   CO2 29 10/12/2017 0842   GLUCOSE 181 (H) 10/12/2017 0842   BUN 30 (H) 10/12/2017 0842   CREATININE 1.48 10/12/2017 0842   CREATININE 1.44 12/21/2014   CALCIUM 10.3 10/12/2017 0842    PROT 7.7 08/18/2016 1522   ALBUMIN 3.9 08/18/2016 1522   AST 30 08/18/2016 1522   AST 16 12/21/2014   ALT 29 08/18/2016 1522   ALT 15 12/21/2014   ALKPHOS 36 (L) 08/18/2016 1522   BILITOT 0.7 08/18/2016 1522   GFRNONAA 50 (L) 06/24/2017 1200   GFRAA 58 (L) 06/24/2017 1200   Lab Results  Component Value Date   CHOL 181 02/08/2017   HDL 37.10 (L) 02/08/2017   LDLCALC 82 05/09/2014   LDLDIRECT 58.0 02/08/2017   TRIG (H) 02/08/2017    461.0 Triglyceride is over 400; calculations on Lipids are invalid.   CHOLHDL 5 02/08/2017   Lab Results  Component Value Date   HGBA1C 9.6 (H) 10/12/2017    MRI HEAD WO CONTRAST 05/14/17 ([facility]) IMPRESSION: Moderate cerebral atrophy. Chronic microvascular ischemic changes in the white matter. No acute abnormality.    ASSESSMENT AND PLAN 77 y.o. year old male  has a past medical history of Arthritis, Bell's palsy, Concussion, Diabetes mellitus type 2 in obese ([facility]), Frequent urination at night, GERD (gastroesophageal reflux disease), Glaucoma, Hyperlipemia, Hypertension, Kidney stones, PONV (postoperative nausea and vomiting), Primary localized osteoarthritis of right hip, Primary osteoarthritis of right knee, Restless leg syndrome, Right knee DJD, and Spinal stenosis of lumbar region with radiculopathy. 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  He does state that there has been some improvement from prior appointment.  It is recommended to continue to do facial muscle exercises several times a day.  Also recommended to continue to follow with his ophthalmologist Dr. Katy Fitch for continued monitoring along with the use of eyedrops.  No further treatment is indicated at this time.  Recommended to follow-up in 6 months or call earlier if needed with questions, concerns or need of sooner follow-up appointment.   Venancio Poisson, AGNP-BC  [facility] [address] [place], [place] [address]  Phone [phone] Fax [phone] Note: This document was prepared with digital dictation and possible smart phrase technology. Any transcriptional errors that result from this process are unintentional.

## 2018-01-18 NOTE — Patient Instructions (Signed)
Continue aspirin 81 mg daily  and lipitor  for secondary stroke prevention  Continue to follow up with PCP regarding cholesterol, blood pressure and diabetes management   Continue to follow with Dr. Katy Fitch for eye care and continue use of eye drops  Monitor for worsening symptoms with facial droop or any other neurologic symptoms  Continue to monitor blood pressure at home  Maintain strict control of hypertension with blood pressure goal below 130/90, diabetes with hemoglobin A1c goal below 6.5% and cholesterol with LDL cholesterol (bad cholesterol) goal below 70 mg/dL. I also advised the patient to eat a healthy diet with plenty of whole grains, cereals, fruits and vegetables, exercise regularly and maintain ideal body weight.  Followup in the future with me in 6 months or call earlier if needed       Thank you for coming to see [place] at [facility]. I hope we have been able to provide you high quality care today.  You may receive a patient satisfaction survey over the next few weeks. We would appreciate your feedback and comments so that we may continue to improve ourselves and the health of our patients.

## 2018-01-19 ENCOUNTER — Ambulatory Visit (INDEPENDENT_AMBULATORY_CARE_PROVIDER_SITE_OTHER): Payer: PPO | Admitting: Family Medicine

## 2018-01-19 ENCOUNTER — Encounter: Payer: Self-pay | Admitting: Family Medicine

## 2018-01-19 VITALS — BP 150/80 | HR 72 | Temp 99.0°F | Ht 72.0 in | Wt 262.2 lb

## 2018-01-19 DIAGNOSIS — Z23 Encounter for immunization: Secondary | ICD-10-CM | POA: Diagnosis not present

## 2018-01-19 DIAGNOSIS — E669 Obesity, unspecified: Secondary | ICD-10-CM

## 2018-01-19 DIAGNOSIS — E1169 Type 2 diabetes mellitus with other specified complication: Secondary | ICD-10-CM

## 2018-01-19 LAB — POCT GLYCOSYLATED HEMOGLOBIN (HGB A1C): Hemoglobin A1C: 7.7 % — AB (ref 4.0–5.6)

## 2018-01-19 NOTE — Progress Notes (Signed)
I agree with the above plan 

## 2018-01-19 NOTE — Patient Instructions (Addendum)
If your sugar is below 100, then cut back 5 units.   If 100-150, then continue as is.   If it is going up a lot then let me know.   Recheck labs in about 3 months, A1c at the visit.  You don't have to fast for the visit.  Take care.  Glad to see you.  Thank you for your effort.

## 2018-01-19 NOTE — Progress Notes (Signed)
Diabetes:  Using medications without difficulties:yes Hypoglycemic episodes:no Hyperglycemic episodes:no Feet problems: occ tingling but less foot swelling in the meantime.   Blood Sugars averaging: 110-150 eye exam within last year:yes A1c clearly better in the meantime.  He cut back on insulin with [place] addition.   He is down to 220 units of insulin once daily.  He has been on that dose for about 4 weeks.    Flu shot done today.   Meds, vitals, and allergies reviewed.   ROS: Per HPI unless specifically indicated in ROS section   GEN: nad, alert and oriented HEENT: mucous membranes moist NECK: supple w/o LA CV: rrr. PULM: ctab, no inc wob ABD: soft, +bs EXT: no edema SKIN: no acute rash His blink function is at new baseline with some post op changes.

## 2018-01-20 ENCOUNTER — Other Ambulatory Visit: Payer: Self-pay | Admitting: Family Medicine

## 2018-01-20 NOTE — Assessment & Plan Note (Addendum)
He still has a high insulin requirement but it is improved from previous.  He is tolerating Januvia.  Discussed with him about routine cautions for hypoglycemia.  Reasonable to try to taper his insulin in the meantime.  Continue work on diet and exercise.  Recheck in a few months but update me as needed in the meantime.  A1c clearly improved.  All discussed with patient.  He agrees.  See after visit summary.

## 2018-01-25 ENCOUNTER — Ambulatory Visit (INDEPENDENT_AMBULATORY_CARE_PROVIDER_SITE_OTHER): Payer: PPO | Admitting: Endocrinology

## 2018-01-25 ENCOUNTER — Encounter: Payer: Self-pay | Admitting: Endocrinology

## 2018-01-25 VITALS — BP 128/70 | HR 88 | Ht 72.0 in | Wt 272.8 lb

## 2018-01-25 DIAGNOSIS — E669 Obesity, unspecified: Secondary | ICD-10-CM

## 2018-01-25 DIAGNOSIS — E1169 Type 2 diabetes mellitus with other specified complication: Secondary | ICD-10-CM

## 2018-01-25 MED ORDER — INSULIN GLARGINE 100 UNIT/ML SOLOSTAR PEN: 230.0000 [IU] | PEN_INJECTOR | SUBCUTANEOUS | 11 refills | Status: DC

## 2018-01-25 MED ORDER — SEMAGLUTIDE(0.25 OR 0.5MG/DOS) 2 MG/1.5ML ~~LOC~~ SOPN: 0.2500 mg | PEN_INJECTOR | SUBCUTANEOUS | 11 refills | Status: DC

## 2018-01-25 NOTE — Patient Instructions (Addendum)
I have sent a prescription to your pharmacy: to start "Ozempic," and to reduce the insulin to 230 units qam. The Ozempic reduce the Januvia.   Please call or message [place] in 2 weeks, to tell us how the blood sugar is doing.   Our goals are to increase the Ozempic and reduce the insulin, to help you lost weight.   check your blood sugar twice a day.  vary the time of day when you check, between before the 3 meals, and at bedtime.  also check if you have symptoms of your blood sugar being too high or too low.  please keep a record of the readings and bring it to your next appointment here (or you can bring the meter itself).  You can write it on any piece of paper.  please call us sooner if your blood sugar goes below 70, or if you have a lot of readings over 200. Please come back for a follow-up appointment in 3 months.

## 2018-01-25 NOTE — Progress Notes (Signed)
Subjective:    Patient ID: [name], male    DOB: 13-Mar-1941, 77 y.o.   MRN: [id number]  HPI Pt returns for f/u of diabetes mellitus: DM type: Insulin-requiring type 2.   Dx'ed: 3748 Complications: renal insuff.   Therapy: insulin since 2017, and januvia.  DKA: never.   Severe hypoglycemia: never.  Pancreatitis: never.   Pancreatic imaging: normal on 2014 [place].  Other: he takes QD insulin, after poor results with multiple daily injections.    Interval history: no cbg record, but states cbg's are in the 100's.   pt states he feels well in general.  Pt says he never misses the insulin.  He takes 300 units qam No recent steroids.  Pt requests to reduce insulin, to allow further weight loss.   Past Medical History:  Diagnosis Date  . Arthritis    arthirtis all over  . Bell's palsy   . Concussion    multiple- college football player  . Diabetes mellitus type 2 in obese ([place])   . Frequent urination at night   . GERD (gastroesophageal reflux disease)    occ indigestion  . Glaucoma    both eyes  . Hyperlipemia   . Hypertension   . Kidney stones   . PONV (postoperative nausea and vomiting)   . Primary localized osteoarthritis of right hip   . Primary osteoarthritis of right knee   . Restless leg syndrome   . Right knee DJD   . Spinal stenosis of lumbar region with radiculopathy    s/p L4/L5 transforaminal blocks    Past Surgical History:  Procedure Laterality Date  . ABDOMINAL HERNIA REPAIR    . ANTERIOR HIP REVISION Right 01/08/2015   Procedure: RIGHT ANTERIOR HIP REVISION;  Surgeon: Renette Butters, MD;  Location: [facility];  Service: Orthopedics;  Laterality: Right;  . BACK SURGERY    . CANTHOPLASTY Right 02/09/2017   Procedure: RIGHT LATERAL CANTHOPLASTY;  Surgeon: Irene Limbo, MD;  Location: [facility];  Service: Plastics;  Laterality: Right;  . CANTHOPLASTY Left 06/29/2017   Procedure: LEFT LOWER CANTHOPLASTY;  Surgeon: Irene Limbo, MD;   Location: [facility];  Service: Plastics;  Laterality: Left;  . COLONOSCOPY    . ELBOW ARTHROSCOPY  1990   right  . ELBOW SURGERY     right  . HERNIA REPAIR  1980   inguinal  . HIP CLOSED REDUCTION Right 11/09/2014   Procedure: CLOSED REDUCTION HIP, s/p total hip 8/9;  Surgeon: Ninetta Lights, MD;  Location: [facility];  Service: Orthopedics;  Laterality: Right;  . JOINT REPLACEMENT  2008   knee  . LITHOTRIPSY    . [place] SURGERY     2015  . RECONSTRUCTION OF EYELID    . REVISION TOTAL HIP ARTHROPLASTY Right 01/08/2015  . SHOULDER ARTHROSCOPY  1980   right  . SHOULDER SURGERY     right  . STONE EXTRACTION WITH BASKET  2010  . TOTAL HIP ARTHROPLASTY Right 11/06/2014   Procedure: RIGHT TOTAL HIP ARTHROPLASTY ANTERIOR APPROACH;  Surgeon: Renette Butters, MD;  Location: [facility];  Service: Orthopedics;  Laterality: Right;  . TOTAL KNEE ARTHROPLASTY Left 07/2006   left knee  . TOTAL KNEE ARTHROPLASTY Right 01/08/2014   Procedure: RIGHT TOTAL KNEE ARTHROPLASTY;  Surgeon: Lorn Junes, MD;  Location: [facility];  Service: Orthopedics;  Laterality: Right;    Social History   Socioeconomic History  . Marital status: Married    Spouse  name: Not on file  . Number of children: Not on file  . Years of education: Not on file  . Highest education level: Not on file  Occupational History  . Not on file  Social Needs  . Financial resource strain: Not on file  . Food insecurity:    Worry: Not on file    Inability: Not on file  . Transportation needs:    Medical: Not on file    Non-medical: Not on file  Tobacco Use  . Smoking status: Never Smoker  . Smokeless tobacco: Never Used  Substance and Sexual Activity  . Alcohol use: Yes    Comment: glass of wine every 3-4 months  . Drug use: No  . Sexual activity: Never  Lifestyle  . Physical activity:    Days per week: Not on file    Minutes per session: Not on file  . Stress: Not on file  Relationships  . Social  connections:    Talks on phone: Not on file    Gets together: Not on file    Attends religious service: Not on file    Active member of club or organization: Not on file    Attends meetings of clubs or organizations: Not on file    Relationship status: Not on file  . Intimate partner violence:    Fear of current or ex partner: Not on file    Emotionally abused: Not on file    Physically abused: Not on file    Forced sexual activity: Not on file  Other Topics Concern  . 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    1 son, 1 daughter, both out of state    Current Outpatient Medications on File Prior to Visit  Medication Sig Dispense Refill  . allopurinol (ZYLOPRIM) 300 MG tablet TAKE 1/2 TABLET BY MOUTH EVERY MORNING DAILY 45 tablet 3  . amLODipine (NORVASC) 10 MG tablet TAKE 1 TABLET BY MOUTH DAILY 90 tablet 1  . aspirin 81 MG tablet Take 81 mg by mouth daily.    [name] atorvastatin (LIPITOR) 10 MG tablet TAKE 1 TABLET BY MOUTH AT BEDTIME 90 tablet 3  . Blood Glucose Monitoring Suppl (ONE TOUCH ULTRA 2) w/Device KIT 1 each daily by Does not apply route. 1 each 0  . CINNAMON PO Take 200 mg by mouth daily.     . dorzolamide-timolol (COSOPT) 22.3-6.8 MG/ML ophthalmic solution Place 1 drop into the left eye 2 (two) times daily.     [name] gemfibrozil (LOPID) 600 MG tablet TAKE 1 TABLET BY MOUTH TWICE DAILY 180 tablet 0  . glucose blood (ONE TOUCH ULTRA TEST) test strip Check blood sugar before and after each meal and as directed. Dx E11.9 200 each 5  . latanoprost (XALATAN) 0.005 % ophthalmic solution Place 1 drop into both eyes at bedtime.    [name] losartan (COZAAR) 100 MG tablet TAKE 1 TABLET BY MOUTH DAILY 90 tablet 0  . meloxicam (MOBIC) 15 MG tablet     . Multiple Vitamins-Minerals (VITRUM 50+ SENIOR MULTI PO) Take 1 tablet by mouth daily.      No current facility-administered medications on file prior to  visit.     Allergies  Allergen Reactions  . Morphine And Related Nausea And Vomiting    Causes nausea & vomiting Causes nausea & vomiting  . Codeine Sulfate Other (See Comments)  intolerant  . Metformin And Related Other (See Comments)    Gi intolerance, not an allergy    Family History  Problem Relation Age of Onset  . Hypertension Mother   . Kidney disease Mother   . Hypertension Father   . Diabetes Mellitus II Father   . Colon polyps Father   . Diabetes Mellitus II Brother   . Diabetes Mellitus II Brother   . Cancer - Prostate Brother   . Colon cancer Neg Hx     BP 128/70 (BP Location: Left Arm, Patient Position: Sitting, Cuff Size: Large)   Pulse 88   Ht 6' (1.829 m)   Wt 272 lb 12.8 oz (123.7 kg)   SpO2 94%   BMI 37.00 kg/m    Review of Systems He denies hypoglycemia    Objective:   Physical Exam VITAL SIGNS:  See vs page GENERAL: no distress Pulses: dorsalis pedis intact bilat.   MSK: no deformity of the feet CV: 2+ bilat leg edema Skin:  no ulcer on the feet.  normal color and temp on the feet. Neuro: sensation is intact to touch on the feet.    Lab Results  Component Value Date   CREATININE 1.48 10/12/2017   BUN 30 (H) 10/12/2017   NA 140 10/12/2017   K 3.7 10/12/2017   CL 101 10/12/2017   CO2 29 10/12/2017    Lab Results  Component Value Date   HGBA1C 7.7 (A) 01/19/2018       Assessment & Plan:  Insulin-requiring type 2 DM: this is the best control this pt should aim for, given this regimen, which does match insulin to her changing needs throughout the day Renal insuff: this limits rx options Obesity: pt requests this be considered in rx decisions.     Patient Instructions  I have sent a prescription to your pharmacy: to start "Ozempic," and to reduce the insulin to 230 units qam. The Ozempic reduce the Januvia.   Please call or message [place] in 2 weeks, to tell us how the blood sugar is doing.   Our goals are to increase the Ozempic  and reduce the insulin, to help you lost weight.   check your blood sugar twice a day.  vary the time of day when you check, between before the 3 meals, and at bedtime.  also check if you have symptoms of your blood sugar being too high or too low.  please keep a record of the readings and bring it to your next appointment here (or you can bring the meter itself).  You can write it on any piece of paper.  please call us sooner if your blood sugar goes below 70, or if you have a lot of readings over 200. Please come back for a follow-up appointment in 3 months.

## 2018-02-02 ENCOUNTER — Ambulatory Visit: Payer: PPO | Admitting: Orthotics

## 2018-02-02 DIAGNOSIS — E1149 Type 2 diabetes mellitus with other diabetic neurological complication: Secondary | ICD-10-CM

## 2018-02-02 DIAGNOSIS — M7752 Other enthesopathy of left foot: Secondary | ICD-10-CM

## 2018-02-02 DIAGNOSIS — L84 Corns and callosities: Secondary | ICD-10-CM

## 2018-02-02 DIAGNOSIS — M7751 Other enthesopathy of right foot: Secondary | ICD-10-CM

## 2018-02-02 DIAGNOSIS — M779 Enthesopathy, unspecified: Secondary | ICD-10-CM

## 2018-02-02 DIAGNOSIS — E1140 Type 2 diabetes mellitus with diabetic neuropathy, unspecified: Secondary | ICD-10-CM

## 2018-02-02 NOTE — Progress Notes (Signed)
Patient had appointment today for definitive and final diabetic shoe fitting and delivery.  Patient was seen by Betha, C.Ped, [facility].   The inserts fit well and accomplished full contact with the plantar surface of the foot bilateral; the shoes fit well and offered forefoot freedom, no noticible heel slippage, and good toe clearance w/ the insert in place.  Patient was advised to monitor of any skin irritation, breakdown.  Patient was satisfied with fit and function. 

## 2018-03-07 ENCOUNTER — Other Ambulatory Visit: Payer: Self-pay

## 2018-03-07 DIAGNOSIS — E669 Obesity, unspecified: Principal | ICD-10-CM

## 2018-03-07 DIAGNOSIS — E1169 Type 2 diabetes mellitus with other specified complication: Secondary | ICD-10-CM

## 2018-03-07 MED ORDER — INSULIN PEN NEEDLE 31G X 5 MM MISC: 1.0000 | Freq: Every day | 3 refills | Status: DC

## 2018-03-07 NOTE — Telephone Encounter (Signed)
Received notification from [place] re: pt request to refill pen needles. Request authorized and refill sent as requested.

## 2018-03-10 ENCOUNTER — Telehealth: Payer: Self-pay | Admitting: *Deleted

## 2018-03-10 NOTE — Telephone Encounter (Signed)
Form done. Thanks. 

## 2018-03-10 NOTE — Telephone Encounter (Signed)
Form from [facility] verifying chronic condition placed in Dr. Josefine Class In Box.

## 2018-03-11 DIAGNOSIS — Z85828 Personal history of other malignant neoplasm of skin: Secondary | ICD-10-CM | POA: Diagnosis not present

## 2018-03-11 DIAGNOSIS — L821 Other seborrheic keratosis: Secondary | ICD-10-CM | POA: Diagnosis not present

## 2018-03-11 DIAGNOSIS — D0462 Carcinoma in situ of skin of left upper limb, including shoulder: Secondary | ICD-10-CM | POA: Diagnosis not present

## 2018-03-11 DIAGNOSIS — L918 Other hypertrophic disorders of the skin: Secondary | ICD-10-CM | POA: Diagnosis not present

## 2018-03-11 DIAGNOSIS — D224 Melanocytic nevi of scalp and neck: Secondary | ICD-10-CM | POA: Diagnosis not present

## 2018-03-11 DIAGNOSIS — L570 Actinic keratosis: Secondary | ICD-10-CM | POA: Diagnosis not present

## 2018-03-14 ENCOUNTER — Other Ambulatory Visit: Payer: Self-pay | Admitting: Family Medicine

## 2018-03-14 ENCOUNTER — Telehealth: Payer: Self-pay | Admitting: Endocrinology

## 2018-03-14 ENCOUNTER — Telehealth: Payer: Self-pay

## 2018-03-14 NOTE — Telephone Encounter (Signed)
Patient states the Ozempic is not working. Please Advise with patient, thanks Ph # [phone]

## 2018-03-14 NOTE — Telephone Encounter (Signed)
Patient is returning call.  °

## 2018-03-14 NOTE — Telephone Encounter (Signed)
Pt stated that his blood sugars have been in the 200's daily and that he checks 3-5 times a day since starting the ozempic.

## 2018-03-14 NOTE — Telephone Encounter (Signed)
lft pt vm for clarification on what his blood sugar readings have been and exactly what he means by not working.

## 2018-03-14 NOTE — Telephone Encounter (Signed)
Called pt back and lft vm to return my call,2nd call

## 2018-03-14 NOTE — Telephone Encounter (Signed)
Please continue the same insulin, and increase Ozempic to 0.5 mg daily.  Please call or message [place] next week, to tell us how the blood sugar is doing.

## 2018-03-15 NOTE — Telephone Encounter (Signed)
Pt notified of Dr. Cordelia Pen orders. Verbalized acceptance and understanding.

## 2018-03-16 ENCOUNTER — Telehealth: Payer: Self-pay | Admitting: Family Medicine

## 2018-03-16 NOTE — Telephone Encounter (Signed)
Need to confirm diagnosis of diabetes or CHF. Please advise

## 2018-03-17 NOTE — Telephone Encounter (Signed)
Message left with receptionist.

## 2018-03-17 NOTE — Telephone Encounter (Signed)
Has diabetes.  I've filled out forms about this question prev.  I don't why they keep asking the same question.  Please have them stop calling about this.

## 2018-03-29 DIAGNOSIS — H401111 Primary open-angle glaucoma, right eye, mild stage: Secondary | ICD-10-CM | POA: Diagnosis not present

## 2018-03-29 DIAGNOSIS — H401123 Primary open-angle glaucoma, left eye, severe stage: Secondary | ICD-10-CM | POA: Diagnosis not present

## 2018-03-30 DIAGNOSIS — H269 Unspecified cataract: Secondary | ICD-10-CM

## 2018-03-30 HISTORY — DX: Unspecified cataract: H26.9

## 2018-04-11 ENCOUNTER — Other Ambulatory Visit: Payer: Self-pay | Admitting: Family Medicine

## 2018-04-11 DIAGNOSIS — E1169 Type 2 diabetes mellitus with other specified complication: Secondary | ICD-10-CM

## 2018-04-15 ENCOUNTER — Other Ambulatory Visit: Payer: PPO

## 2018-04-15 ENCOUNTER — Telehealth: Payer: Self-pay | Admitting: *Deleted

## 2018-04-15 NOTE — Telephone Encounter (Signed)
Fax received from [facility] indicating pt is returning call from office. No indication of reason for call

## 2018-04-19 ENCOUNTER — Ambulatory Visit (INDEPENDENT_AMBULATORY_CARE_PROVIDER_SITE_OTHER): Payer: HMO | Admitting: Family Medicine

## 2018-04-19 ENCOUNTER — Encounter: Payer: Self-pay | Admitting: Family Medicine

## 2018-04-19 DIAGNOSIS — E669 Obesity, unspecified: Secondary | ICD-10-CM | POA: Diagnosis not present

## 2018-04-19 DIAGNOSIS — E1169 Type 2 diabetes mellitus with other specified complication: Secondary | ICD-10-CM

## 2018-04-19 LAB — POCT GLYCOSYLATED HEMOGLOBIN (HGB A1C): Hemoglobin A1C: 8.8 % — AB (ref 4.0–5.6)

## 2018-04-19 MED ORDER — AMLODIPINE BESYLATE 10 MG PO TABS: 10.0000 mg | ORAL_TABLET | Freq: Every day | ORAL | 3 refills | Status: DC

## 2018-04-19 MED ORDER — INSULIN GLARGINE 100 UNIT/ML SOLOSTAR PEN: 220.0000 [IU] | PEN_INJECTOR | SUBCUTANEOUS | Status: DC

## 2018-04-19 MED ORDER — SITAGLIPTIN PHOSPHATE 100 MG PO TABS: 50.0000 mg | ORAL_TABLET | Freq: Every day | ORAL | 3 refills | Status: DC

## 2018-04-19 NOTE — Progress Notes (Signed)
He is going to have cataract surgery in the near future.  He has eye clinic f/u pending.    Diabetes:  Using medications without difficulties:see below Hypoglycemic episodes:no Hyperglycemic episodes:no Feet problems:no Blood Sugars averaging: 200-220, rarely below 200.   eye exam within last year: yes A1c 8.8.  Up from prev with diet changes over the holidays.   He wanted to get off ozempic and consider restart of januvia. He thought ozempic wasn't helping.  He didn't have troubles with more fluid retention on januvia.    Higher salt intake recently with higher BP noted.  D/w pt.  See avs.    He admits his diet had been off, d/w pt.    Slowly resolving URI with nasal congestion.  Using nasal saline.  Clear rhinorrhea.  No fevers.  D/w pt about claritin trial.    Meds, vitals, and allergies reviewed.  ROS: Per HPI unless specifically indicated in ROS section   GEN: nad, alert and oriented HEENT: mucous membranes moist, oropharynx within normal limits.  Clear rhinorrhea noted. NECK: supple w/o LA CV: rrr. PULM: ctab, no inc wob ABD: soft, +bs EXT: trace BLE edema SKIN: no acute rash

## 2018-04-19 NOTE — Patient Instructions (Addendum)
Cut back on salt and let me know if your BP doesn't normalize.   Start januvia, stop ozempic.  Start 1/2 tab of januvia for about 2 weeks, then 1 tab a day if tolerated.  You'll likely need to cut back on insulin with [place] addition.   Recheck in about 3 months.  The only lab you need to have done for your next diabetic visit is an A1c.  We can do this with a fingerstick test at the office visit.  You do not need a lab visit ahead of time for this.  It does not matter if you are fasting when the lab is done.    Let me know how you are doing in about 1 month, sooner if needed.  Take care.  Glad to see you.

## 2018-04-21 NOTE — Assessment & Plan Note (Signed)
Discussed options.  He wanted to follow-up ear  Cut back on salt and he will let me know if BP doesn't normalize.   Start januvia, stop ozempic.  Start 1/2 tab of januvia for about 2 weeks, then 1 tab a day if tolerated.  He will likely need to cut back on insulin with [place] addition.  Discussed cutting back by 1 unit/day.  Recheck in about 3 months.  Labs at visit.  He agrees.  Update me as needed in the meantime.  He can use plain Claritin as needed for rhinorrhea.  In no apparent distress and his lungs are clear.

## 2018-04-28 ENCOUNTER — Ambulatory Visit: Payer: PPO | Admitting: Endocrinology

## 2018-05-03 ENCOUNTER — Telehealth: Payer: Self-pay | Admitting: *Deleted

## 2018-05-03 NOTE — Telephone Encounter (Signed)
Spoke to pt who states he was to "keep in touch" regarding his BP readings. He states that he went to give blood this am and his BP was 159/94. Pt denies HA, dizziness or any other s/s. pls advise

## 2018-05-05 MED ORDER — METOPROLOL SUCCINATE ER 25 MG PO TB24: 25.0000 mg | ORAL_TABLET | Freq: Every day | ORAL | 3 refills | Status: DC

## 2018-05-05 NOTE — Telephone Encounter (Signed)
Left detailed message on voicemail.  

## 2018-05-05 NOTE — Telephone Encounter (Signed)
I thank him for the update.  That was similar to prev.  I would try adding on metoprolol 25mg  a day and see if that will get his BP down to ~140/90.  rx sent.  Update me as needed next week.  Thanks.

## 2018-05-06 NOTE — Telephone Encounter (Signed)
Patient advised and has already picked up the medication.

## 2018-05-10 DIAGNOSIS — H401123 Primary open-angle glaucoma, left eye, severe stage: Secondary | ICD-10-CM | POA: Diagnosis not present

## 2018-05-10 DIAGNOSIS — H2513 Age-related nuclear cataract, bilateral: Secondary | ICD-10-CM | POA: Diagnosis not present

## 2018-05-10 DIAGNOSIS — H401111 Primary open-angle glaucoma, right eye, mild stage: Secondary | ICD-10-CM | POA: Diagnosis not present

## 2018-05-10 DIAGNOSIS — H04123 Dry eye syndrome of bilateral lacrimal glands: Secondary | ICD-10-CM | POA: Diagnosis not present

## 2018-05-11 ENCOUNTER — Other Ambulatory Visit: Payer: Self-pay

## 2018-05-11 NOTE — Patient Outreach (Signed)
  [facility]) Care Management Chronic Special Needs Program  05/11/2018  Name: [name] DOB: [birth year]  MRN: [id number]  [name] is enrolled in a chronic special needs plan for Diabetes.   The client's individualized care plan was developed based upon the completed health risk assessment.  Plan:   . Send  letter with a copy of individualized care plan to client . Send individualized care plan to provider  Chronic care management coordinator will attempt outreach in 2-4 months.   Thea Silversmith, RN, MSN, [place] [phone]

## 2018-05-17 DIAGNOSIS — M7581 Other shoulder lesions, right shoulder: Secondary | ICD-10-CM | POA: Diagnosis not present

## 2018-05-17 DIAGNOSIS — M542 Cervicalgia: Secondary | ICD-10-CM | POA: Diagnosis not present

## 2018-05-26 ENCOUNTER — Other Ambulatory Visit: Payer: Self-pay | Admitting: Endocrinology

## 2018-05-26 ENCOUNTER — Other Ambulatory Visit: Payer: Self-pay | Admitting: General Practice

## 2018-05-26 MED ORDER — GLUCOSE BLOOD VI STRP: ORAL_STRIP | 5 refills | Status: DC

## 2018-06-27 ENCOUNTER — Telehealth: Payer: Self-pay | Admitting: Family Medicine

## 2018-06-27 NOTE — Telephone Encounter (Addendum)
Have him price check basaglar and levemir as once daily shots.   Also have him price check NPH insulin.  He may be able to get that straight from the local pharmacy but he would have to use a regular syringe, not a pen, and given himself a shot twice a day.    Also see if he can price check trulicity as a substitute for [place].    Let me know which way he wants to go/what he finds out and I'll work on it.  Thanks.

## 2018-06-27 NOTE — Telephone Encounter (Signed)
Pt stated he can't afford the insulin with the cost of $700 and want to know if there is something else he could use. Please advise pt

## 2018-06-28 NOTE — Telephone Encounter (Signed)
Received inbound call from patient. Provided instructions regarding alternative insulins to patient. Patient stated he was in donut hole. Patient stated he would only buy a couple of vials of Lantus at a time.

## 2018-06-29 ENCOUNTER — Other Ambulatory Visit: Payer: Self-pay | Admitting: Pharmacist

## 2018-06-29 ENCOUNTER — Other Ambulatory Visit: Payer: Self-pay

## 2018-06-29 NOTE — Patient Outreach (Signed)
  [facility]) Care Management Chronic Special Needs Program    06/29/2018  Name: [name], DOB: 04/14/1940  MRN: [id number]   [name] is enrolled in a chronic special needs plan for Diabetes.  Referral received from primary care provider. Client is requesting assistance while in coverage gap; client has reached $2457.70 while in the coverage gap.   Per referral the contact number to reach client is [phone].   RNCM called [phone] to follow up. No answer. HIPPA compliant message RNCM called contact number in chart and received message that voicemail box has not been set up yet.  Plan: send referral to [place] for assistance. RNCM will follow up within 5-7 business days if no return call.   Thea Silversmith, RN, MSN, [place] [phone]

## 2018-06-29 NOTE — Telephone Encounter (Signed)
Noted.  I will await update from patient after he price checks these medicines.

## 2018-06-29 NOTE — Patient Outreach (Signed)
[facility]) Care Management  [place]  06/29/2018  [name] 1941-01-30 [id number]   Reason for call: medication assistance  Outreach:  Unsuccessful telephone call attempt #1 to patient.   HIPAA compliant voicemail left requesting a return call  Plan:  I will mail patient an unsuccessful outreach letter, I will make another outreach attempt to patient within 3-4 business days   Ralene Bathe, PharmD, [place] [phone]

## 2018-07-05 DIAGNOSIS — R351 Nocturia: Secondary | ICD-10-CM | POA: Diagnosis not present

## 2018-07-05 DIAGNOSIS — R350 Frequency of micturition: Secondary | ICD-10-CM | POA: Diagnosis not present

## 2018-07-07 ENCOUNTER — Ambulatory Visit: Payer: Self-pay

## 2018-07-11 ENCOUNTER — Other Ambulatory Visit: Payer: Self-pay | Admitting: Pharmacist

## 2018-07-11 NOTE — Patient Outreach (Signed)
[facility]) Care Management  [place]  07/11/2018  [name] 1940-05-09 [id number]   Reason for referral: Medication Assistance  Referral source: [employer] Current insurance: [employer] C-SNP  Outreach:  Unsuccessful telephone call attempt #2 to patient.   HIPAA compliant voicemail left requesting a return call  Plan:  -I will make another outreach attempt to patient within 3-4 business days.    Ralene Bathe, PharmD, [place] [phone]

## 2018-07-12 DIAGNOSIS — Z794 Long term (current) use of insulin: Secondary | ICD-10-CM | POA: Diagnosis not present

## 2018-07-12 DIAGNOSIS — E119 Type 2 diabetes mellitus without complications: Secondary | ICD-10-CM | POA: Diagnosis not present

## 2018-07-12 DIAGNOSIS — H2513 Age-related nuclear cataract, bilateral: Secondary | ICD-10-CM | POA: Diagnosis not present

## 2018-07-12 DIAGNOSIS — H04123 Dry eye syndrome of bilateral lacrimal glands: Secondary | ICD-10-CM | POA: Diagnosis not present

## 2018-07-12 DIAGNOSIS — H43813 Vitreous degeneration, bilateral: Secondary | ICD-10-CM | POA: Diagnosis not present

## 2018-07-12 DIAGNOSIS — H401111 Primary open-angle glaucoma, right eye, mild stage: Secondary | ICD-10-CM | POA: Diagnosis not present

## 2018-07-12 DIAGNOSIS — H401123 Primary open-angle glaucoma, left eye, severe stage: Secondary | ICD-10-CM | POA: Diagnosis not present

## 2018-07-13 ENCOUNTER — Other Ambulatory Visit: Payer: Self-pay

## 2018-07-13 NOTE — Patient Outreach (Signed)
  [place] [facility]) Care Management Chronic Special Needs Program  07/13/2018  Name: [name] DOB: Jun 19, 1940  MRN: [id number]  [name] is enrolled in a chronic special needs plan for Diabetes. Reviewed and updated care plan. COVID-19 precautions discussed.  Subjective: client reports he is in the "donut hole". He states cost of his insulin is about $700/month. But states he was able to get a two week supply from his pharmacy. Client denies any questions about COVID 19. Client reports he is not on track for weight loss because of the COVID-19 precaution. But states he was able to mow his lawn this week.  Goals Addressed            This Visit's Progress   . Client understands the importance of follow-up with providers by attending scheduled visits   On track   . Client will report abillity to obtain Medications within the next 3 months.   On track   . Client will use Assistive Devices as needed and verbalize understanding of device use   On track   . Client will verbalize knowledge of self management of Hypertension as evidences by BP reading of 140/90 or less; or as defined by provider   On track   . Diabetes Patient stated goal: Weight Loss (pt-stated)   On track    Stay as active as possible. [employer]ne[employer] classes that let you work out without going to a gym or Industrial/product designer for more information [phone]. Review Handouts: Diabetes and diet; Diabetes Diet; Type 2 Diabetes: Food and Exercise.    . Increase physical activity   Not on track    Starting 10/14/2015, I will continue to exercise for at least 30 minutes 4-5 days per week.     . Increase physical activity   Not on track    Starting 02/08/2017, I will continue to exercise for 40-90 minutes 3-5 days per week.     . Maintain timely refills of diabetic medication as prescribed within the year .   On track   . Obtain annual  Lipid Profile, LDL-C   On track   . Obtain  Annual Eye (retinal)  Exam    On track   . Obtain Annual Foot Exam   On track   . Obtain annual screen for micro albuminuria (urine) , nephropathy (kidney problems)   On track   . Obtain Hemoglobin A1C at least 2 times per year   On track   . Visit Primary Care Provider or Endocrinologist at least 2 times per year    On track     RNCM provided [facility] pharmacist contact information and encouraged client to call. RNCM also notified Mrs. Summe of client's updated contact number.  Client encouraged to call RNCM as needed. Also reviewed 24 hour nurse advice contact number and encouraged client to call health care concierge for any benefit questions as needed.  Plan: continue to follow. RNCM will follow up with client in 7-8 months or sooner if needed.  Thea Silversmith, RN, MSN, [place] [phone]

## 2018-07-14 ENCOUNTER — Ambulatory Visit: Payer: Self-pay | Admitting: Pharmacist

## 2018-07-15 ENCOUNTER — Other Ambulatory Visit: Payer: Self-pay | Admitting: Pharmacist

## 2018-07-15 ENCOUNTER — Other Ambulatory Visit: Payer: HMO

## 2018-07-15 ENCOUNTER — Ambulatory Visit (INDEPENDENT_AMBULATORY_CARE_PROVIDER_SITE_OTHER): Payer: HMO

## 2018-07-15 ENCOUNTER — Other Ambulatory Visit: Payer: Self-pay

## 2018-07-15 ENCOUNTER — Telehealth (INDEPENDENT_AMBULATORY_CARE_PROVIDER_SITE_OTHER): Payer: HMO

## 2018-07-15 ENCOUNTER — Ambulatory Visit: Payer: Self-pay | Admitting: Pharmacist

## 2018-07-15 ENCOUNTER — Other Ambulatory Visit: Payer: Self-pay | Admitting: Pharmacy Technician

## 2018-07-15 DIAGNOSIS — E669 Obesity, unspecified: Secondary | ICD-10-CM | POA: Diagnosis not present

## 2018-07-15 DIAGNOSIS — E7849 Other hyperlipidemia: Secondary | ICD-10-CM | POA: Diagnosis not present

## 2018-07-15 DIAGNOSIS — I10 Essential (primary) hypertension: Secondary | ICD-10-CM

## 2018-07-15 DIAGNOSIS — E790 Hyperuricemia without signs of inflammatory arthritis and tophaceous disease: Secondary | ICD-10-CM | POA: Diagnosis not present

## 2018-07-15 DIAGNOSIS — E1169 Type 2 diabetes mellitus with other specified complication: Secondary | ICD-10-CM | POA: Diagnosis not present

## 2018-07-15 DIAGNOSIS — Z0000 Encounter for general adult medical examination without abnormal findings: Secondary | ICD-10-CM | POA: Diagnosis not present

## 2018-07-15 DIAGNOSIS — Z125 Encounter for screening for malignant neoplasm of prostate: Secondary | ICD-10-CM | POA: Diagnosis not present

## 2018-07-15 LAB — CBC WITH DIFFERENTIAL/PLATELET
Basophils Absolute: 0 10*3/uL (ref 0.0–0.1)
Basophils Relative: 0.6 % (ref 0.0–3.0)
Eosinophils Absolute: 0.3 10*3/uL (ref 0.0–0.7)
Eosinophils Relative: 4 % (ref 0.0–5.0)
HCT: 41.6 % (ref 39.0–52.0)
Hemoglobin: 14.4 g/dL (ref 13.0–17.0)
Lymphocytes Relative: 16 % (ref 12.0–46.0)
Lymphs Abs: 1.2 10*3/uL (ref 0.7–4.0)
MCHC: 34.6 g/dL (ref 30.0–36.0)
MCV: 89.8 fl (ref 78.0–100.0)
Monocytes Absolute: 0.8 10*3/uL (ref 0.1–1.0)
Monocytes Relative: 11 % (ref 3.0–12.0)
Neutro Abs: 5.1 10*3/uL (ref 1.4–7.7)
Neutrophils Relative %: 68.4 % (ref 43.0–77.0)
Platelets: 262 10*3/uL (ref 150.0–400.0)
RBC: 4.63 Mil/uL (ref 4.22–5.81)
RDW: 15.5 % (ref 11.5–15.5)
WBC: 7.4 10*3/uL (ref 4.0–10.5)

## 2018-07-15 LAB — COMPREHENSIVE METABOLIC PANEL
ALT: 27 U/L (ref 0–53)
AST: 19 U/L (ref 0–37)
Albumin: 3.8 g/dL (ref 3.5–5.2)
Alkaline Phosphatase: 36 U/L — ABNORMAL LOW (ref 39–117)
BUN: 28 mg/dL — ABNORMAL HIGH (ref 6–23)
CO2: 30 mEq/L (ref 19–32)
Calcium: 9.7 mg/dL (ref 8.4–10.5)
Chloride: 102 mEq/L (ref 96–112)
Creatinine, Ser: 1.33 mg/dL (ref 0.40–1.50)
GFR: 52.09 mL/min — ABNORMAL LOW (ref >60.00)
Glucose, Bld: 225 mg/dL — ABNORMAL HIGH (ref 70–99)
Potassium: 4.1 mEq/L (ref 3.5–5.1)
Sodium: 140 mEq/L (ref 135–145)
Total Bilirubin: 0.5 mg/dL (ref 0.2–1.2)
Total Protein: 6.9 g/dL (ref 6.0–8.3)

## 2018-07-15 LAB — LIPID PANEL
Cholesterol: 156 mg/dL (ref 0–200)
HDL: 33.1 mg/dL — ABNORMAL LOW (ref >39.00)
NonHDL: 122.55
Total CHOL/HDL Ratio: 5
Triglycerides: 391 mg/dL — ABNORMAL HIGH (ref 0.0–149.0)
VLDL: 78.2 mg/dL — ABNORMAL HIGH (ref 0.0–40.0)

## 2018-07-15 LAB — PSA, MEDICARE: PSA: 0.52 ng/ml (ref 0.10–4.00)

## 2018-07-15 LAB — LDL CHOLESTEROL, DIRECT: Direct LDL: 52 mg/dL

## 2018-07-15 LAB — HEMOGLOBIN A1C: Hgb A1c MFr Bld: 9.6 % — ABNORMAL HIGH (ref 4.6–6.5)

## 2018-07-15 LAB — URIC ACID: Uric Acid, Serum: 4.8 mg/dL (ref 4.0–7.8)

## 2018-07-15 NOTE — Progress Notes (Signed)
Subjective:   [name] is a 78 y.o. male who presents for Medicare Annual/Subsequent preventive examination.  Review of Systems:  N/A Cardiac Risk Factors include: advanced age (>38mn, >>15women);male gender;diabetes mellitus;hypertension;obesity (BMI >30kg/m2);dyslipidemia     Objective:    Vitals: There were no vitals taken for this visit.  There is no height or weight on file to calculate BMI.  Advanced Directives 07/15/2018 05/11/2018 06/29/2017 06/23/2017 02/09/2017 02/08/2017 02/04/2017  Does Patient Have a Medical Advance Directive? Yes Yes Yes Yes Yes Yes Yes  Type of AParamedicof AGansLiving will Healthcare Power of [place] will -  Does patient want to make changes to medical advance directive? - - - - - - -  Copy of [place] Chart? Yes - validated most recent copy scanned in chart (See row information) - Yes Yes Yes Yes No - copy requested  Would patient like information on creating a medical advance directive? - - - - - - -  Pre-existing out of facility DNR order (yellow form or pink MOST form) - - - - - - -    Tobacco Social History   Tobacco Use  Smoking Status Never Smoker  Smokeless Tobacco Never Used     Counseling given: No   Clinical Intake:  Pre-visit preparation completed: Yes  Pain : No/denies pain Pain Score: 0-No pain     Nutritional Status: BMI > 30  Obese Nutritional Risks: None Diabetes: Yes CBG done?: No Did pt. bring in CBG monitor from home?: No  How often do you need to have someone help you when you read instructions, pamphlets, or other written materials from your doctor or pharmacy?: 1 - Never What is the last grade level you completed in school?: Bachelor degree  Interpreter Needed?: No  Comments: pt lives with spouse Information  entered by :: LPinson, LPN  Past Medical History:  Diagnosis Date  . Arthritis    arthirtis all over  . Bell's palsy   . Cataract 2020   bilateral eyes -pending surgery June 2020  . Concussion    multiple- college football player  . Diabetes mellitus type 2 in obese ([place]   . Frequent urination at night   . GERD (gastroesophageal reflux disease)    occ indigestion  . Glaucoma    both eyes  . Hyperlipemia   . Hypertension   . Kidney stones   . PONV (postoperative nausea and vomiting)   . Primary localized osteoarthritis of right hip   . Primary osteoarthritis of right knee   . Restless leg syndrome   . Right knee DJD   . Spinal stenosis of lumbar region with radiculopathy    s/p L4/L5 transforaminal blocks   Past Surgical History:  Procedure Laterality Date  . ABDOMINAL HERNIA REPAIR    . ANTERIOR HIP REVISION Right 01/08/2015   Procedure: RIGHT ANTERIOR HIP REVISION;  Surgeon: TRenette Butters MD;  [place]  Service: Orthopedics;  Laterality: Right;  . BACK SURGERY    . CANTHOPLASTY Right 02/09/2017   Procedure: RIGHT LATERAL CANTHOPLASTY;  Surgeon: TIrene Limbo MD;  [place]  Service: Plastics;  Laterality: Right;  . CANTHOPLASTY Left 06/29/2017   Procedure: LEFT LOWER CANTHOPLASTY;  Surgeon: TIrene Limbo MD;  [place]  Service: Plastics;  Laterality: Left;  . COLONOSCOPY    .  ELBOW ARTHROSCOPY  1990   right  . ELBOW SURGERY     right  . HERNIA REPAIR  1980   inguinal  . HIP CLOSED REDUCTION Right 11/09/2014   Procedure: CLOSED REDUCTION HIP, s/p total hip 8/9;  Surgeon: Ninetta Lights, MD;  Location: [place];  Service: Orthopedics;  Laterality: Right;  . JOINT REPLACEMENT  2008   knee  . LITHOTRIPSY    . [place] SURGERY     2015  . RECONSTRUCTION OF EYELID    . REVISION TOTAL HIP ARTHROPLASTY Right 01/08/2015  . SHOULDER ARTHROSCOPY  1980   right  . SHOULDER SURGERY     right  .  STONE EXTRACTION WITH BASKET  2010  . TOTAL HIP ARTHROPLASTY Right 11/06/2014   Procedure: RIGHT TOTAL HIP ARTHROPLASTY ANTERIOR APPROACH;  Surgeon: Renette Butters, MD;  Location: [place];  Service: Orthopedics;  Laterality: Right;  . TOTAL KNEE ARTHROPLASTY Left 07/2006   left knee  . TOTAL KNEE ARTHROPLASTY Right 01/08/2014   Procedure: RIGHT TOTAL KNEE ARTHROPLASTY;  Surgeon: Lorn Junes, MD;  Location: [place];  Service: Orthopedics;  Laterality: Right;   Family History  Problem Relation Age of Onset  . Hypertension Mother   . Kidney disease Mother   . Hypertension Father   . Diabetes Mellitus II Father   . Colon polyps Father   . Diabetes Mellitus II Brother   . Diabetes Mellitus II Brother   . Cancer - Prostate Brother   . Colon cancer Neg Hx    Social History   Socioeconomic History  . Marital status: Married    Spouse name: Not on file  . Number of children: Not on file  . Years of education: Not on file  . Highest education level: Not on file  Occupational History  . Not on file  Social Needs  . Financial resource strain: Not on file  . Food insecurity:    Worry: Not on file    Inability: Not on file  . Transportation needs:    Medical: Not on file    Non-medical: Not on file  Tobacco Use  . Smoking status: Never Smoker  . Smokeless tobacco: Never Used  Substance and Sexual Activity  . Alcohol use: Yes    Comment: glass of wine every 3-4 months  . Drug use: No  . Sexual activity: Never  Lifestyle  . Physical activity:    Days per week: Not on file    Minutes per session: Not on file  . Stress: Not on file  Relationships  . Social connections:    Talks on phone: Not on file    Gets together: Not on file    Attends religious service: Not on file    Active member of club or organization: Not on file    Attends meetings of clubs or organizations: Not on file    Relationship status: Not on file  Other Topics Concern  . 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    1 son, 1 daughter, both out of state    Outpatient Encounter Medications as of 07/15/2018  Medication Sig  . allopurinol (ZYLOPRIM) 300 MG tablet TAKE 1/2 TABLET BY MOUTH EVERY MORNING DAILY  . amLODipine (NORVASC) 10 MG tablet Take 1 tablet (10 mg total) by mouth daily.  [name] aspirin 81 MG tablet Take 81 mg  by mouth daily.  [name] atorvastatin (LIPITOR) 10 MG tablet TAKE 1 TABLET BY MOUTH AT BEDTIME  . Blood Glucose Monitoring Suppl (ONE TOUCH ULTRA 2) w/Device KIT 1 each daily by Does not apply route.  . dorzolamide-timolol (COSOPT) 22.3-6.8 MG/ML ophthalmic solution Place 1 drop into the left eye 2 (two) times daily.   [name] gemfibrozil (LOPID) 600 MG tablet TAKE 1 TABLET BY MOUTH TWICE DAILY  . glucose blood (ONE TOUCH ULTRA TEST) test strip Check blood sugar before and after each meal and as directed. Dx E11.9  . Insulin Glargine (LANTUS SOLOSTAR) 100 UNIT/ML Solostar Pen Inject 220 Units into the skin every morning. And pen needles 2/day  . Insulin Pen Needle (B-D UF III MINI PEN NEEDLES) 31G X 5 MM MISC 1 each by Does not apply route daily. Use to administer insulin daily  . latanoprost (XALATAN) 0.005 % ophthalmic solution Place 1 drop into both eyes at bedtime.  [name] losartan (COZAAR) 100 MG tablet TAKE 1 TABLET BY MOUTH DAILY  . meloxicam (MOBIC) 15 MG tablet as needed.   . metoprolol succinate (TOPROL-XL) 25 MG 24 hr tablet Take 1 tablet (25 mg total) by mouth daily.  . Multiple Vitamins-Minerals (VITRUM 50+ SENIOR MULTI PO) Take 1 tablet by mouth daily.   [name] oxybutynin (DITROPAN-XL) 10 MG 24 hr tablet Take 10 mg by mouth daily.  . sitaGLIPtin (JANUVIA) 100 MG tablet Take 0.5-1 tablets (50-100 mg total) by mouth daily. (Patient taking differently: Take 100 mg by mouth daily. )  . CINNAMON PO Take 200 mg by mouth daily.    No facility-administered encounter medications on file as of  07/15/2018.     Activities of Daily Living In your present state of health, do you have any difficulty performing the following activities: 07/15/2018 07/13/2018  Hearing? N N  Vision? Y N  Difficulty concentrating or making decisions? Y Y  Comment - sometimes short term memory, like recall names  Walking or climbing stairs? N N  Dressing or bathing? N N  Doing errands, shopping? N N  Preparing Food and eating ? N N  Using the Toilet? N N  In the past six months, have you accidently leaked urine? Y -  Do you have problems with loss of bowel control? N -  Managing your Medications? N N  Managing your Finances? N N  Housekeeping or managing your Housekeeping? N N  Some recent data might be hidden    Patient Care Team: Tonia Ghent, MD as PCP - General (Family Medicine) Clent Jacks, MD as Consulting Physician (Ophthalmology) Renato Shin, MD as Consulting Physician (Endocrinology) Armbruster, Carlota Raspberry, MD as Consulting Physician (Gastroenterology) Irene Limbo, MD as Consulting Physician (Plastic Surgery) Garvin Fila, MD as Consulting Physician (Neurology) Luretha Rued, RN as [place] [facility] Summe, Octavio Graves, [facility] as [place] Management (Pharmacist) Adaline Sill, CPhT as [place] Management (Pharmacy Technician)   Assessment:   This is a routine wellness examination for Yarnell.  Vision Screening Comments: Vision exam in April 2020 at [place] and Dietary recommendations Current Exercise Habits: Home exercise routine, Type of exercise: walking, Time (Minutes): 40, Frequency (Times/Week): 7, Weekly Exercise (Minutes/Week): 280, Intensity: Mild, Exercise limited by: orthopedic condition(s)  Goals      Patient Stated   . Diabetes Patient stated goal: Weight Loss (pt-stated)     Stay as active as possible. SilverSneakers classes that let you work out  without  going to a gym or facility-contact your [place] for more information [phone]. Review Handouts: Diabetes and diet; Diabetes Diet; Type 2 Diabetes: Food and Exercise.      Other   . Client understands the importance of follow-up with providers by attending scheduled visits    . Client will report abillity to obtain Medications within the next 3 months.    . Client will use Assistive Devices as needed and verbalize understanding of device use    . Client will verbalize knowledge of self management of Hypertension as evidences by BP reading of 140/90 or less; or as defined by provider    . HEMOGLOBIN A1C < 7.0    . Increase physical activity     Starting 10/14/2015, I will continue to exercise for at least 30 minutes 4-5 days per week.     . Increase physical activity     Starting 02/08/2017, I will continue to exercise for 40-90 minutes 3-5 days per week.     . Maintain timely refills of diabetic medication as prescribed within the year .    [name] Obtain annual  Lipid Profile, LDL-C    . Obtain Annual Eye (retinal)  Exam     . Obtain Annual Foot Exam    . Obtain annual screen for micro albuminuria (urine) , nephropathy (kidney problems)    . Obtain Hemoglobin A1C at least 2 times per year    . Visit Primary Care Provider or Endocrinologist at least 2 times per year        Fall Risk Fall Risk  07/15/2018 01/18/2018 07/19/2017 02/08/2017 01/14/2017  Falls in the past year? 1 Yes No Yes No  Comment tripped and fell; denies injury - - patient fell after taking Codeine cough syrup -  Number falls in past yr: 0 1 - 1 -  Injury with Fall? 1 No - No -  Follow up - - - - -    Depression Screen PHQ 2/9 Scores 07/15/2018 07/13/2018 02/08/2017 01/14/2017  PHQ - 2 Score 0 0 1 0  PHQ- 9 Score 0 - 2 -    Cognitive Function MMSE - Mini Mental State Exam 07/15/2018 02/08/2017 10/14/2015  Not completed: Unable to complete - -  Orientation to time - 5 5  Orientation to Place - 5 5   Registration - 3 3  Attention/ Calculation - 0 0  Recall - 3 3  Language- name 2 objects - 0 0  Language- repeat - 1 1  Language- follow 3 step command - 3 3  Language- read & follow direction - 0 0  Write a sentence - 0 0  Copy design - 0 0  Total score - 20 20        Immunization History  Administered Date(s) Administered  . Influenza Split 01/30/2009, 02/07/2010  . Influenza, High Dose Seasonal PF 12/17/2011, 01/14/2017  . Influenza,inj,Quad PF,6+ Mos 02/17/2016, 01/19/2018  . Influenza,trivalent, recombinat, inj, PF 01/02/2011  . Influenza-Unspecified 03/11/2015  . PPD Test 08/04/2008  . Pneumococcal Conjugate-13 02/07/2010, 03/26/2014  . Pneumococcal Polysaccharide-23 02/07/2010  . Td 04/04/2012  . Tdap 03/26/2006  . Zoster 03/30/2009    Screening Tests Health Maintenance  Topic Date Due  . INFLUENZA VACCINE  10/29/2018  . COLONOSCOPY  12/30/2018  . HEMOGLOBIN A1C  01/14/2019  . FOOT EXAM  01/26/2019  . OPHTHALMOLOGY EXAM  07/05/2019  . TETANUS/TDAP  04/04/2022  . PNA vac Low Risk Adult  Completed  Plan:     I have personally reviewed, addressed, and noted the following in the patient's chart:  A. Medical and social history B. Use of alcohol, tobacco or illicit drugs  C. Current medications and supplements D. Functional ability and status E.  Nutritional status F.  Physical activity G. Advance directives H. List of other physicians I.  Hospitalizations, surgeries, and ER visits in previous 12 months J.  Vitals (unless it is a telemedicine encounter) K. Screenings to include hearing, vision, cognitive, depression L. Referrals and appointments   In addition, I have reviewed and discussed with patient certain preventive protocols, quality metrics, and best practice recommendations. A written personalized care plan for preventive services and recommendations were provided to patient.  With patient's permission, we connected on 07/15/18 at 10:00 AM EDT  by a video enabled telemedicine application. Two patient identifiers were used to ensure the encounter occurred with the correct person. .   Patient was in home and writer was in office.   Signed,   Lindell Noe, MHA, BS, LPN Health Coach

## 2018-07-15 NOTE — Telephone Encounter (Signed)
Lab orders placed. Sent to PCP for review.

## 2018-07-15 NOTE — Patient Outreach (Signed)
[facility]) Care Management  [place]   07/15/2018  [name] 1941/03/26 [id number]  Reason for referral: Medication Assistance  Referral source: [place] Management RN with [employer] C-[employer] Current insurance: [employer] C-[employer]  PMHx includes but not limited to:  T2DM, GERD, DJD, OA, Bell's palsy, HTN, HLD, allergic rhinitis  Outreach:  Successful telephone call with [name].  HIPAA identifiers verified.   Subjective:  Patient reports he is now in the coverage gap and medication prices have significantly increased.  He reports he uses a box of lantus pens (#5) ~ 6 days.  He reports he has never been on Toujeo (concentrated 300 units/mL) insulin.  He reports he sees Dr. Loanne Drilling but last office visit was Oct 2019 and he has not been in touch since.  He tried Ozempic x 2 months at increased to 0.56m dose but stated he did not see any changes in his CBGs.  He plans on going back to Dr. DDamita Dunningsfor further diabetes management and next appointment is next week.  Patient reports copay for sitagliptin has not been high and he can manage this without patient assistance.   Objective: Lab Results  Component Value Date   CREATININE 1.33 07/15/2018   CREATININE 1.48 10/12/2017   CREATININE 1.34 (H) 06/24/2017    Lab Results  Component Value Date   HGBA1C 9.6 (H) 07/15/2018    Lipid Panel     Component Value Date/Time   CHOL 156 07/15/2018 0935   CHOL 211 12/21/2014   TRIG 391.0 (H) 07/15/2018 0935   TRIG 413 12/21/2014   HDL 33.10 (L) 07/15/2018 0935   CHOLHDL 5 07/15/2018 0935   VLDL 78.2 (H) 07/15/2018 0935   LDLCALC 82 05/09/2014   LDLDIRECT 52.0 07/15/2018 0935    BP Readings from Last 3 Encounters:  04/19/18 (!) 156/92  01/25/18 128/70  01/19/18 (!) 150/80    Allergies  Allergen Reactions  . Morphine And Related Nausea And Vomiting    Causes nausea & vomiting Causes nausea & vomiting  . Codeine Sulfate Other  (See Comments)    intolerant  . Metformin And Related Other (See Comments)    Gi intolerance, not an allergy    Medications Reviewed Today    Reviewed by SRudean Haskell RPH (Pharmacist) on 07/15/18 at 1228  Med List Status: <None>  Medication Order Taking? Sig Documenting Provider Last Dose Status Informant  allopurinol (ZYLOPRIM) 300 MG tablet [id number] TAKE 1/2 TABLET BY MOUTH EVERY MORNING DAILY DTonia Ghent MD Taking Active   amLODipine (NORVASC) 10 MG tablet [id number] Take 1 tablet (10 mg total) by mouth daily. DTonia Ghent MD Taking Active   aspirin 81 MG tablet [id number] Take 81 mg by mouth daily. [provider] Taking Active Self  atorvastatin (LIPITOR) 10 MG tablet [id number] TAKE 1 TABLET BY MOUTH AT BEDTIME DTonia Ghent MD Taking Active   Blood Glucose Monitoring Suppl (ONE TOUCH ULTRA 2) w/Device KIT [id number] 1 each daily by Does not apply route. ERenato Shin MD Taking Active   CINNAMON PO [id number] Take 200 mg by mouth daily.  [provider] Not Taking Active   dorzolamide-timolol (COSOPT) 22.3-6.8 MG/ML ophthalmic solution [id number] Place 1 drop into the left eye 2 (two) times daily.  [provider] Taking Active   gemfibrozil (LOPID) 600 MG tablet [id number] TAKE 1 TABLET BY MOUTH TWICE DAILY DTonia Ghent MD Taking Active  glucose blood (ONE TOUCH ULTRA TEST) test strip [id number] Yes Check blood sugar before and after each meal and as directed. Dx E11.9 Tonia Ghent, MD Taking Active   Insulin Glargine (LANTUS SOLOSTAR) 100 UNIT/ML Solostar Pen [id number] Yes Inject 220 Units into the skin every morning. And pen needles 2/day Tonia Ghent, MD Taking Active   Insulin Pen Needle (B-D UF III MINI PEN NEEDLES) 31G X 5 MM MISC [id number] Yes 1 each by Does not apply route daily. Use to administer insulin daily Renato Shin, MD Taking Active   latanoprost (XALATAN) 0.005 % ophthalmic solution  [id number] Yes Place 1 drop into both eyes at bedtime. [provider] Taking Active Self  losartan (COZAAR) 100 MG tablet [id number] Yes TAKE 1 TABLET BY MOUTH DAILY Tonia Ghent, MD Taking Active   meloxicam [facility]) 15 MG tablet [id number] Yes as needed.  [provider] Taking Active   metoprolol succinate (TOPROL-XL) 25 MG 24 hr tablet [id number] Yes Take 1 tablet (25 mg total) by mouth daily. Tonia Ghent, MD Taking Active   Multiple Vitamins-Minerals (VITRUM 50+ SENIOR MULTI PO) [id number] Yes Take 1 tablet by mouth daily.  [provider] Taking Active Self  oxybutynin (DITROPAN-XL) 10 MG 24 hr tablet [id number] Yes Take 10 mg by mouth daily. [provider] Taking Active   sitaGLIPtin (JANUVIA) 100 MG tablet [id number] Yes Take 0.5-1 tablets (50-100 mg total) by mouth daily.  Patient taking differently:  Take 100 mg by mouth daily.    Tonia Ghent, MD Taking Active           Assessment:  Drugs sorted by system:  Cardiovascular: amlodipine, aspirin 58m, atorvastatin, gemfibrozil, losartan, metoprolol succinate  Endocrine: insulin glargine, sitagliptin  Topical: dorzolamide-timolol eye drops, latanoprost  Pain: meloxicam  Genitourinary: oxybutynin  Vitamins/Minerals/Supplements: MVI, cinnamon  Miscellaneous: allopurinol  Medication Assistance Findings:  Medication assistance needs identified.   Extra Help:  Not eligible for Extra Help Low Income Subsidy based on reported income and assets  Patient Assistance Programs: Lantus made by SAlbertson'so Income requirement met: Yes o Out-of-pocket prescription expenditure met:   No (2% household income) - Patient has not met application requirements to apply for this patient assistance program at this time. - Reviewed program requirements with patients.   - Patient thinks he is close to meeting 2% TROOP.  Would like for uKoreato start application process as he will need to pay for more  insulin soon.  Does not wish to see Dr. ELoanne Drillingany longer and states that provider portion should go to Dr. DDamita Dunnings Also discussed possibility of Toujeo if insulin requirement remains high.   Plan: . I will route patient assistance letter to TTeatechnician who will coordinate patient assistance program application process for medications listed above.  [facility] LLCpharmacy technician will assist with obtaining all required documents from both patient and provider(s) and submit application(s) once completed.   . Will route note to Dr. DDorthula Rue PharmD, [phone]

## 2018-07-15 NOTE — Patient Instructions (Signed)
[name] , Thank you for taking time to come for your Medicare Wellness Visit. I appreciate your ongoing commitment to your health goals. Please review the following plan we discussed and let me know if I can assist you in the future.   These are the goals we discussed: Goals      Patient Stated   . Diabetes Patient stated goal: Weight Loss (pt-stated)     Stay as active as possible. SilverSneakers classes that let you work out without going to a gym or Industrial/product designer for more information [phone]. Review Handouts: Diabetes and diet; Diabetes Diet; Type 2 Diabetes: Food and Exercise.      Other   . Client understands the importance of follow-up with providers by attending scheduled visits    . Client will report abillity to obtain Medications within the next 3 months.    . Client will use Assistive Devices as needed and verbalize understanding of device use    . Client will verbalize knowledge of self management of Hypertension as evidences by BP reading of 140/90 or less; or as defined by provider    . HEMOGLOBIN A1C < 7.0    . Increase physical activity     Starting 10/14/2015, I will continue to exercise for at least 30 minutes 4-5 days per week.     . Increase physical activity     Starting 02/08/2017, I will continue to exercise for 40-90 minutes 3-5 days per week.     . Maintain timely refills of diabetic medication as prescribed within the year .    [name] Obtain annual  Lipid Profile, LDL-C    . Obtain Annual Eye (retinal)  Exam     . Obtain Annual Foot Exam    . Obtain annual screen for micro albuminuria (urine) , nephropathy (kidney problems)    . Obtain Hemoglobin A1C at least 2 times per year    . Visit Primary Care Provider or Endocrinologist at least 2 times per year        This is a list of the screening recommended for you and due dates:  Health Maintenance  Topic Date Due  . Flu Shot  10/29/2018  . Colon Cancer Screening   12/30/2018  . Hemoglobin A1C  01/14/2019  . Complete foot exam   01/26/2019  . Eye exam for diabetics  07/05/2019  . Tetanus Vaccine  04/04/2022  . Pneumonia vaccines  Completed   Preventive Care for Adults  A healthy lifestyle and preventive care can promote health and wellness. Preventive health guidelines for adults include the following key practices.  . A routine yearly physical is a good way to check with your health care provider about your health and preventive screening. It is a chance to share any concerns and updates on your health and to receive a thorough exam.  . Visit your dentist for a routine exam and preventive care every 6 months. Brush your teeth twice a day and floss once a day. Good oral hygiene prevents tooth decay and gum disease.  . The frequency of eye exams is based on your age, health, family medical history, use  of contact lenses, and other factors. Follow your health care provider's recommendations for frequency of eye exams.  . Eat a healthy diet. Foods like vegetables, fruits, whole grains, low-fat dairy products, and lean protein foods contain the nutrients you need without too many calories. Decrease your intake of foods high in solid fats, added sugars, and salt.  Eat the right amount of calories for you. Get information about a proper diet from your health care provider, if necessary.  . Regular physical exercise is one of the most important things you can do for your health. Most adults should get at least 150 minutes of moderate-intensity exercise (any activity that increases your heart rate and causes you to sweat) each week. In addition, most adults need muscle-strengthening exercises on 2 or more days a week.  Silver Sneakers may be a benefit available to you. To determine eligibility, you may visit the website: www.silversneakers.com or contact program at 224 557 8735 Mon-Fri between 8AM-8PM.   . Maintain a healthy weight. The body mass index (BMI)  is a screening tool to identify possible weight problems. It provides an estimate of body fat based on height and weight. Your health care provider can find your BMI and can help you achieve or maintain a healthy weight.   For adults 20 years and older: ? A BMI below 18.5 is considered underweight. ? A BMI of 18.5 to 24.9 is normal. ? A BMI of 25 to 29.9 is considered overweight. ? A BMI of 30 and above is considered obese.   . Maintain normal blood lipids and cholesterol levels by exercising and minimizing your intake of saturated fat. Eat a balanced diet with plenty of fruit and vegetables. Blood tests for lipids and cholesterol should begin at age 78 and be repeated every 5 years. If your lipid or cholesterol levels are high, you are over 50, or you are at high risk for heart disease, you may need your cholesterol levels checked more frequently. Ongoing high lipid and cholesterol levels should be treated with medicines if diet and exercise are not working.  . If you smoke, find out from your health care provider how to quit. If you do not use tobacco, please do not start.  . If you choose to drink alcohol, please do not consume more than 2 drinks per day. One drink is considered to be 12 ounces (355 mL) of beer, 5 ounces (148 mL) of wine, or 1.5 ounces (44 mL) of liquor.  . If you are 76-54 years old, ask your health care provider if you should take aspirin to prevent strokes.  . Use sunscreen. Apply sunscreen liberally and repeatedly throughout the day. You should seek shade when your shadow is shorter than you. Protect yourself by wearing long sleeves, pants, a wide-brimmed hat, and sunglasses year round, whenever you are outdoors.  . Once a month, do a whole body skin exam, using a mirror to look at the skin on your back. Tell your health care provider of new moles, moles that have irregular borders, moles that are larger than a pencil eraser, or moles that have changed in shape or  color.

## 2018-07-15 NOTE — Patient Outreach (Signed)
[place] [facility]) Care Management  07/15/2018  SERJIO DEUPREE Apr 05, 1940 [id number]                          Medication Assistance Referral  Referral From: [facility]  Medication/Company: Lantus / Sanofi Patient application portion:  Education officer, museum portion:will fax provider portion once provider has been confirmed.   Follow up:  Will follow up with patient in 7-10 business days to confirm application(s) have been received.  [name] [place] Certified Pharmacy Technician [place] Management Direct Dial:[phone]

## 2018-07-15 NOTE — Telephone Encounter (Signed)
Agreed.  Thanks.  

## 2018-07-15 NOTE — Progress Notes (Signed)
PCP notes:   Health maintenance:  A1C - completed  Abnormal screenings:   Fall risk - hx of single fall Fall Risk  07/15/2018 01/18/2018 07/19/2017 02/08/2017 01/14/2017  Falls in the past year? 1 Yes No Yes No  Comment tripped and fell; denies injury - - patient fell after taking Codeine cough syrup -  Number falls in past yr: 0 1 - 1 -  Injury with Fall? 1 No - No -  Follow up - - - - -    Patient concerns:   None  Nurse concerns:  None  Next PCP appt:   07/21/18 @ 0815  I reviewed health advisor's note, was available for consultation on the day of service listed in this note, and agree with documentation and plan. Elsie Stain, MD.

## 2018-07-18 ENCOUNTER — Other Ambulatory Visit: Payer: HMO

## 2018-07-20 ENCOUNTER — Ambulatory Visit: Payer: PPO | Admitting: Adult Health

## 2018-07-21 ENCOUNTER — Ambulatory Visit (INDEPENDENT_AMBULATORY_CARE_PROVIDER_SITE_OTHER): Payer: HMO | Admitting: Family Medicine

## 2018-07-21 DIAGNOSIS — E1169 Type 2 diabetes mellitus with other specified complication: Secondary | ICD-10-CM

## 2018-07-21 DIAGNOSIS — E785 Hyperlipidemia, unspecified: Secondary | ICD-10-CM | POA: Diagnosis not present

## 2018-07-21 DIAGNOSIS — E669 Obesity, unspecified: Secondary | ICD-10-CM

## 2018-07-21 DIAGNOSIS — M1611 Unilateral primary osteoarthritis, right hip: Secondary | ICD-10-CM | POA: Diagnosis not present

## 2018-07-21 DIAGNOSIS — M109 Gout, unspecified: Secondary | ICD-10-CM

## 2018-07-21 MED ORDER — SITAGLIPTIN PHOSPHATE 100 MG PO TABS: 100.0000 mg | ORAL_TABLET | Freq: Every day | ORAL | Status: DC

## 2018-07-21 NOTE — Progress Notes (Signed)
Interactive audio and video telecommunications were attempted between this provider and patient, however failed, due to patient having technical difficulties OR patient did not have access to video capability.  We continued and completed visit with audio only.   Virtual Visit via Telephone Note  I connected with patient on 07/21/18 at 820 by telephone and verified that I am speaking with the correct person using two identifiers.  Location of patient: home  Location of MD: [place] Name of referring provider (if blank then none associated): Names per persons and role in encounter:  MD: Earlyne Iba, Patient: name listed above.    I discussed the limitations, risks, security and privacy concerns of performing an evaluation and management service by telephone and the availability of in person appointments. I also discussed with the patient that there may be a patient responsible charge related to this service. The patient expressed understanding and agreed to proceed.  History of Present Illness:   Pandemic considerations discussed with patient.  No symptoms of COVID.  Routine cautions discussed.  Gout. Compliant with allopurinol, no recent flares.  Uric acid at goal.  Elevated Cholesterol: Using medications without problems: yes Muscle aches:  no Diet compliance: encouraged.  Exercise: limited by hip pain chronically.   Labs d/w pt.   Diabetes:  Using medications without difficulties: yes Hypoglycemic episodes: no Hyperglycemic episodes: see below.  Feet problems: no Blood Sugars averaging: usually ~190 in the AM, occ higher.   Labs d/w pt.   PSA wnl.  D/w pt.    Hip pain d/w pt.  He had prev been doing [facility] but [facility].  He is trying to be as active as possible  PMH and SH reviewed.   Meds and allergies reviewed.   ROS: Per HPI unless specifically indicated in ROS section    Observations/Objective:nad Speaking in complete  sentences.    Assessment and Plan:  HLD.  Would continue as is and work on diet/DM2.    DM2.  I will check with pharmacy about trulicity and stopping Tonga.  Would still continue Tonga for now.  D/w pt about diet and portions.  Will need recheck in about 3 months.  He is working on exercise as best he can.  Gout. Would continue as is.  He agrees.  Hip pain d/w pt.  He had prev been doing better [place] [facility], but [facility] is shut down.  He is trying to be as active as possible  Pandemic considerations discussed with patient.  No symptoms of COVID.  Routine cautions discussed.  Follow Up Instructions:see above.    I discussed the assessment and treatment plan with the patient. The patient was provided an opportunity to ask questions and all were answered. The patient agreed with the plan and demonstrated an understanding of the instructions.   The patient was advised to call back or seek an in-person evaluation if the symptoms worsen or if the condition fails to improve as anticipated.  I provided 22 minutes of non-face-to-face time during this encounter.  Elsie Stain, MD

## 2018-07-22 ENCOUNTER — Other Ambulatory Visit: Payer: Self-pay | Admitting: Pharmacist

## 2018-07-22 ENCOUNTER — Ambulatory Visit: Payer: Self-pay | Admitting: Pharmacist

## 2018-07-22 ENCOUNTER — Encounter: Payer: Self-pay | Admitting: Family Medicine

## 2018-07-22 ENCOUNTER — Other Ambulatory Visit: Payer: Self-pay | Admitting: Pharmacy Technician

## 2018-07-22 DIAGNOSIS — M109 Gout, unspecified: Secondary | ICD-10-CM | POA: Insufficient documentation

## 2018-07-22 NOTE — Assessment & Plan Note (Signed)
HLD.  Would continue as is and work on diet/DM2.

## 2018-07-22 NOTE — Patient Outreach (Signed)
[place] [facility]) Care Management  07/22/2018  LYALL FACIANE 16-Jan-1941 [id number]                          Medication Assistance Referral  Referral From: [place]: Trulicity / Ralph [facility]yden Cares Patient application portion:  Mailed Provider application portion: Faxed  to Dr. Damita Dunnings   Follow up:  Will follow up with patient in 7-10 business days to confirm application(s) have been received.  [name] [place] Certified Pharmacy Technician [place] Management Direct Dial:[phone]

## 2018-07-22 NOTE — Assessment & Plan Note (Signed)
Hip pain d/w pt.  He had prev been doing better [facility], but [facility] is shut [facility].  He is trying to be as active as possible

## 2018-07-22 NOTE — Assessment & Plan Note (Signed)
Gout. Would continue as is.  He agrees.

## 2018-07-22 NOTE — Assessment & Plan Note (Signed)
DM2.  I will check with pharmacy about trulicity and stopping Tonga.  Would still continue Tonga for now.  D/w pt about diet and portions.  Will need recheck in about 3 months.  He is working on exercise as best he can.

## 2018-07-22 NOTE — Patient Outreach (Addendum)
[place] [facility]) Care Management  [place] 07/22/2018  MORTON SIMSON 11/29/1940 [id number]  Communication received from Dr. Damita Dunnings that he will manage diabetes regimen for patient.  Dr. Damita Dunnings and Mr. Sawyer had o/v yesterday to discuss therapy.  Patient will be started on Trulicity and Januvia will be stopped.  Patient will continue on Lantus for now and will be assessed in 3-6 months regarding possible change to more concentrated insulin.  [facility] will assist patient with applying for both Lantus and Trulicity patient assistance programs.  Lantus application already mailed to patient.   Call placed to Mr. Gikas to update him that another patient assistance application for Trulicity will be mailed to him.  Patient voiced understanding.  He stated he has not been told to stop Januvia yet.   Plan: I will route patient assistance letter to [place] technician who will coordinate patient assistance program application process for medications listed above.  [facility] pharmacy technician will assist with obtaining all required documents from both patient and provider(s) and submit application(s) once completed.    Will f/u with patient in 3-4 weeks to confirm Januvia will be discontinued once Trulicity approved  Ralene Bathe, PharmD, [place] [phone]

## 2018-07-27 ENCOUNTER — Other Ambulatory Visit: Payer: Self-pay | Admitting: Pharmacist

## 2018-07-27 NOTE — Patient Outreach (Signed)
[place] [facility]) Care Management  [place] 07/27/2018  [name] [birth year] [id number]  Incoming call and voicemail received from [name] with questions about therapy changes from Dr. Damita Dunnings.   Return call placed to [name] this morning.  [name] reports that he had o/v with Dr. Damita Dunnings last week but did not know if there were any changes to his medications yet.  He is planning on going to the pharmacy today.  He is in the coverage gap therefore does not want to pay for medications that he is no longer supposed to take.  I relayed that Dr. Damita Dunnings ok'd for patient to apply for Trulicity and continue on Lantus, stop Jardiance however I do not know if this is to start immediately or only have Trulicity approved via patient assistance program.  I advised him to contact Dr. Damita Dunnings if he wants to discuss this timing.  I do not see any new prescriptions called into his pharmacy per review of medications via [facility].  [name] voiced understanding.  He has not received applications in the mail yet from [facility] but is watching for them daily.  I advised him to contact me on Friday if they have not arrived yet.   Plan: Continue patient assistance program application process for Lantus + Trulicity.   Ralene Bathe, PharmD, [place] [phone]

## 2018-08-01 ENCOUNTER — Other Ambulatory Visit: Payer: Self-pay | Admitting: Pharmacy Technician

## 2018-08-01 NOTE — Patient Outreach (Signed)
[place] [facility]) Care Management  08/01/2018  MEKHI SONN 06/04/1940 [id number]   Incoming call from patient stating he received patient assistance applications. He states that once he gets a copy of his proof of income he will mail it back in.  Will follow up with patient in 14-21 business days if documents have not been received.  [name] [place] Certified Pharmacy Technician [place] Management Direct Dial:[phone]

## 2018-08-11 ENCOUNTER — Other Ambulatory Visit: Payer: Self-pay | Admitting: Family Medicine

## 2018-08-11 ENCOUNTER — Ambulatory Visit: Payer: Self-pay | Admitting: Pharmacist

## 2018-08-11 MED ORDER — DULAGLUTIDE 0.75 MG/0.5ML ~~LOC~~ SOAJ: 0.7500 mg | SUBCUTANEOUS | 3 refills | Status: DC

## 2018-08-12 ENCOUNTER — Other Ambulatory Visit: Payer: Self-pay | Admitting: Pharmacy Technician

## 2018-08-12 NOTE — Patient Outreach (Signed)
[facility] [facility]) Care Management  08/12/2018  [name] 1940-06-23 [id number]   Received patient portion(s) of patient assistance application for Lantus and Trulicity. Refaxed provider portions to Dr. Damita Dunnings for completion.  Will submit completed application to companies once all documents have been received.  [name] [place] Certified Pharmacy Technician [place] Management Direct Dial:[phone]

## 2018-08-18 ENCOUNTER — Telehealth: Payer: Self-pay | Admitting: Family Medicine

## 2018-08-18 ENCOUNTER — Other Ambulatory Visit: Payer: Self-pay | Admitting: Pharmacy Technician

## 2018-08-18 NOTE — Patient Outreach (Signed)
[place] [facility]) Care Management  08/18/2018  [name] [birth year] [id number]    Follow up call placed to providers office regarding patient assistance application(s) for Lantus and Trulicity , called to verify fax with provider portions of the apps have been received. Courtney to send message to nurse.  [name] [place] Certified Pharmacy Technician [place] Management Direct [phone]

## 2018-08-18 NOTE — Telephone Encounter (Signed)
[name] with [employer] called to verify that we received the fax from her for the patient's assistance applications.  She is trying to help the patient receive assistance for his Trulicity and Lantus.   She stated if there is any questions or if we have not received these to send her an in basket message or give her a call  [name] Phone- [phone]  She will be out of the office Friday but will return call/message on Monday.

## 2018-08-19 NOTE — Telephone Encounter (Signed)
I had filled this out prev for it to be processed.  What is the status currently?

## 2018-08-19 NOTE — Telephone Encounter (Signed)
Please review

## 2018-08-19 NOTE — Telephone Encounter (Signed)
Form is scanned under media section. Left Caryl Pina a message to let us know if we need to resend or if there is another form to be filled out.

## 2018-08-22 ENCOUNTER — Other Ambulatory Visit: Payer: Self-pay | Admitting: Family Medicine

## 2018-08-22 NOTE — Telephone Encounter (Signed)
Are we still waiting to hear?  What is the likely timeline on this?  Is there anything we are supposed to do in the meantime?  I thought we were just awaiting approval.  Let me know.  Thanks.

## 2018-08-23 ENCOUNTER — Other Ambulatory Visit: Payer: Self-pay | Admitting: Pharmacy Technician

## 2018-08-23 NOTE — Telephone Encounter (Signed)
[name] sent me a commute message and she said she did not receive form earlier this month due to issues with phone/fax. She is able to see scanned form in the chart and will print that. [name] will let us know if there is anything else she needs from [place]. FYI to Dr. Damita Dunnings

## 2018-08-23 NOTE — Telephone Encounter (Signed)
I called and left [name] a message again to call back today.  Per note below [name] was off on Friday last week ( I called Friday and left message). She was back to the office yesterday but we were closed. Should be calling us back today

## 2018-08-23 NOTE — Patient Outreach (Signed)
[place] [facility]) Care Management  08/23/2018  [name] 1940/05/16 [id number]   Printed provider portion of patient assistance applications off "Media" tab in Epic. Faxed completed application and required documents into [employer] Clinical biochemist) and [employer] Paramedic).  Will follow up with companies in 2-3 business days to check application statuses.  [name] [place] Certified Pharmacy Technician [place] Management Direct Dial:[phone]

## 2018-08-24 NOTE — Telephone Encounter (Signed)
Noted. Thanks. I will await update.

## 2018-08-29 ENCOUNTER — Other Ambulatory Visit: Payer: Self-pay | Admitting: Pharmacy Technician

## 2018-08-29 NOTE — Patient Outreach (Signed)
[place] [facility]) Care Management  08/29/2018  KILO ESHELMAN 18-Mar-1941 [id number]    Follow up call placed to [place] regarding patient assistance application(s) for Lantus Solostar , Blair Promise confirms patient has been approved as of 5/28 until 03/30/19. Medication to arrive at providers office in 3-5 business days.   Follow up call placed to [facility] regarding patient assistance application(s) for Trulicity , Lorriane Shire states that application is still being processed. Will follow up with [employer] in 2-3 business days.   Successful call placed to patient regarding patient assistance update for Lantus and Trulicity, HIPAA identifiers verified. Informed Mr. [name] of above details.  [name] [place] Certified Pharmacy Technician [place] Management Direct Dial:[phone]

## 2018-08-30 ENCOUNTER — Other Ambulatory Visit: Payer: Self-pay | Admitting: Pharmacy Technician

## 2018-08-30 NOTE — Patient Outreach (Signed)
[place] [facility]) Care Management  08/30/2018  [name] 1940-07-27 [id number]    Follow up call placed to [facility] regarding patient assistance application(s) for Trulicity , Caryl Pina confirms patient has been approved as of 6/2 until 03/30/19. Faxed script portion of application into [facility].  Follow up:  Will follow up with [facility] in 2-3 business days to check order status.  [name] [place] Certified Pharmacy Technician [place] Management Direct Dial:[phone]

## 2018-08-31 ENCOUNTER — Telehealth: Payer: Self-pay | Admitting: *Deleted

## 2018-08-31 NOTE — Telephone Encounter (Signed)
Received Lantus Insulin from [employer] x 11 boxes of 5 x 3 ml pens.  Lot [id number] Exp: 03/29/2021.  [name] notified medication is available for pick up at our office.

## 2018-09-01 DIAGNOSIS — H401123 Primary open-angle glaucoma, left eye, severe stage: Secondary | ICD-10-CM | POA: Diagnosis not present

## 2018-09-01 DIAGNOSIS — H2512 Age-related nuclear cataract, left eye: Secondary | ICD-10-CM | POA: Diagnosis not present

## 2018-09-02 ENCOUNTER — Other Ambulatory Visit: Payer: Self-pay | Admitting: Pharmacy Technician

## 2018-09-02 NOTE — Patient Outreach (Signed)
[place] [facility]) Care Management  09/02/2018  [name] 1940/06/01 [id number]    Follow up call placed to [facility] regarding patient assistance update for Trulicity, Collie Siad states that medication is ready to be set up for delivery. Requested that she contact patient to set up. She confirms that she would do so.  Follow up:  Will follow up with patient in 2-3 business days to confirm shipment has been set up.  [name] [place] Certified Pharmacy Technician [place] Management Direct [phone]

## 2018-09-07 ENCOUNTER — Other Ambulatory Visit: Payer: Self-pay | Admitting: Pharmacy Technician

## 2018-09-07 NOTE — Patient Outreach (Signed)
[place] [facility]) Care Management  09/07/2018  [name] 26-Apr-1940 [id number]    Incoming call from patient regarding patient assistance medication receipt from [facility], Mr. Sprung states that he picked up his Lantus Solostar from Dr. Carole Civil office.   He questioned the status of Trulicity, informed him that Friday I was told by Collie Siad at [employer] that she would outreach him to set up shipment if medication. Mr. Noon states that he has not received a call from [place] however he has been in and out of the house.  Provided him with [facility] phone number that he can use to set up shipment. Also informed him that I will attempt to outreach [facility] as well.  Follow up:  Will follow up with [facility] in 2-3 business days and patient.  [name] [place] Certified Pharmacy Technician [place] Management Direct Dial:[phone]

## 2018-09-09 DIAGNOSIS — D1801 Hemangioma of skin and subcutaneous tissue: Secondary | ICD-10-CM | POA: Diagnosis not present

## 2018-09-09 DIAGNOSIS — D225 Melanocytic nevi of trunk: Secondary | ICD-10-CM | POA: Diagnosis not present

## 2018-09-09 DIAGNOSIS — D485 Neoplasm of uncertain behavior of skin: Secondary | ICD-10-CM | POA: Diagnosis not present

## 2018-09-09 DIAGNOSIS — Z85828 Personal history of other malignant neoplasm of skin: Secondary | ICD-10-CM | POA: Diagnosis not present

## 2018-09-09 DIAGNOSIS — D224 Melanocytic nevi of scalp and neck: Secondary | ICD-10-CM | POA: Diagnosis not present

## 2018-09-09 DIAGNOSIS — L821 Other seborrheic keratosis: Secondary | ICD-10-CM | POA: Diagnosis not present

## 2018-09-09 DIAGNOSIS — C4441 Basal cell carcinoma of skin of scalp and neck: Secondary | ICD-10-CM | POA: Diagnosis not present

## 2018-09-09 DIAGNOSIS — L570 Actinic keratosis: Secondary | ICD-10-CM | POA: Diagnosis not present

## 2018-09-27 ENCOUNTER — Other Ambulatory Visit: Payer: Self-pay | Admitting: Pharmacy Technician

## 2018-09-27 NOTE — Patient Outreach (Signed)
[place] [facility]) Care Management  09/27/2018  KORRY DALGLEISH 1940/10/26 [id number]    Unsuccessful call #1 placed to patient regarding patient assistance medication receipt from [facility], HIPAA compliant voicemail left.   Follow up:  Will make 2nd call attempt in 5-7 business days if call has not been returned.  [name] [place] Certified Pharmacy Technician [place] Management Direct Dial:[phone]

## 2018-10-02 ENCOUNTER — Telehealth: Payer: Self-pay | Admitting: Family Medicine

## 2018-10-02 DIAGNOSIS — I779 Disorder of arteries and arterioles, unspecified: Secondary | ICD-10-CM

## 2018-10-02 NOTE — Telephone Encounter (Signed)
With patient having started trulicity and stopped [place], how is he doing and how is his sugar?  Please let me know.  Thanks.

## 2018-10-04 NOTE — Telephone Encounter (Signed)
Spoke to patient by telephone and was advised that he is doing real good. Patient stated that his blood sugar has been running around 150 and last night it was 130. Advised patient if he has any problems to let Dr. Damita Dunnings know and he verbalized understanding.

## 2018-10-09 NOTE — Addendum Note (Signed)
Addended by: Tonia Ghent on: 10/09/2018 11:02 PM   Modules accepted: Orders

## 2018-10-09 NOTE — Telephone Encounter (Addendum)
Great.  Continue as is.  If he has any troubles let me know.  Please schedule diabetes follow-up in 3 months.    And he is due for f/u CTA re: aorta.  I put in the f/u order for that.  Thanks.

## 2018-10-10 ENCOUNTER — Other Ambulatory Visit: Payer: Self-pay

## 2018-10-10 ENCOUNTER — Other Ambulatory Visit: Payer: Self-pay | Admitting: Pharmacist

## 2018-10-10 ENCOUNTER — Ambulatory Visit: Payer: PPO | Admitting: Podiatry

## 2018-10-10 ENCOUNTER — Other Ambulatory Visit: Payer: Self-pay | Admitting: Pharmacy Technician

## 2018-10-10 ENCOUNTER — Encounter: Payer: Self-pay | Admitting: Podiatry

## 2018-10-10 VITALS — Temp 97.6°F

## 2018-10-10 DIAGNOSIS — E1140 Type 2 diabetes mellitus with diabetic neuropathy, unspecified: Secondary | ICD-10-CM

## 2018-10-10 DIAGNOSIS — E1149 Type 2 diabetes mellitus with other diabetic neurological complication: Secondary | ICD-10-CM

## 2018-10-10 DIAGNOSIS — L84 Corns and callosities: Secondary | ICD-10-CM | POA: Diagnosis not present

## 2018-10-10 DIAGNOSIS — M7752 Other enthesopathy of left foot: Secondary | ICD-10-CM

## 2018-10-10 DIAGNOSIS — M779 Enthesopathy, unspecified: Secondary | ICD-10-CM

## 2018-10-10 NOTE — Patient Outreach (Signed)
[place] [facility]) Care Management [place]  10/10/2018  DONTAYE HUR 04-18-1940 [id number]  Patient has been approved for Lantus and Trulicity patient assistance program(s).  Patient has been instructed on how to order refills and renewal process for 2020.    Plan: [place] case is being closed due to the following reasons: -Goals of care have been met.  -Patient has been provided with [facility] contact information   Ralene Bathe, PharmD, [place] [phone]

## 2018-10-10 NOTE — Telephone Encounter (Signed)
Patient stated he received another call from our office after speaking to the medical asst. But did not receive a message as to what it was in regards to .

## 2018-10-10 NOTE — Telephone Encounter (Signed)
Patient advised. Appointment scheduled.  

## 2018-10-10 NOTE — Patient Outreach (Signed)
[name]) Care Management  10/10/2018  [name] 22-Jul-1940 [id number]    Successful call placed to patient regarding patient assistance medication receipt from [facility], HIPAA identifiers verified. Mr. Palmateer confirms that he received his Trulicity from [employer].  Reviewed with patient how to obtain refills for Trulicity and for Lantus Solostar thru [facility].  Follow up:  Will route note to [place] for case closure.  [name] [place] Certified Pharmacy Technician [place] Management Direct Dial:[phone]

## 2018-10-13 ENCOUNTER — Other Ambulatory Visit: Payer: Self-pay | Admitting: Family Medicine

## 2018-10-13 ENCOUNTER — Other Ambulatory Visit (INDEPENDENT_AMBULATORY_CARE_PROVIDER_SITE_OTHER): Payer: HMO

## 2018-10-13 DIAGNOSIS — E1169 Type 2 diabetes mellitus with other specified complication: Secondary | ICD-10-CM

## 2018-10-13 DIAGNOSIS — I10 Essential (primary) hypertension: Secondary | ICD-10-CM

## 2018-10-13 LAB — CREATININE, SERUM: Creatinine, Ser: 1.25 mg/dL (ref 0.40–1.50)

## 2018-10-13 LAB — BUN: BUN: 26 mg/dL — ABNORMAL HIGH (ref 6–23)

## 2018-10-13 NOTE — Progress Notes (Signed)
Subjective:   Patient ID: [name], male   DOB: 78 y.o.   MRN: [id number]   HPI Patient presents with painful lesion sub-first metatarsal left that is been sore.  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  I injected the plantar capsule 3 mg dexamethasone 1 mg Kenalog 5 g Xylocaine applied sterile dressing and debrided a painful plantar lesion and patient will be seen back for routine care or earlier if any issues were to occur

## 2018-10-18 DIAGNOSIS — C4441 Basal cell carcinoma of skin of scalp and neck: Secondary | ICD-10-CM | POA: Diagnosis not present

## 2018-10-18 DIAGNOSIS — Z85828 Personal history of other malignant neoplasm of skin: Secondary | ICD-10-CM | POA: Diagnosis not present

## 2018-10-25 DIAGNOSIS — Z961 Presence of intraocular lens: Secondary | ICD-10-CM | POA: Diagnosis not present

## 2018-10-26 ENCOUNTER — Telehealth: Payer: Self-pay

## 2018-10-26 NOTE — Telephone Encounter (Signed)
With his sugars being that well controlled at his current dose and I would continue as is.  If I need to send a new order then please let me know.  If he is having any trouble with low sugars let me know so we can adjust his insulin downward.  I thank him for his effort.  He has follow-up scheduled according to the EMR.

## 2018-10-26 NOTE — Telephone Encounter (Signed)
Received a form from patient assistance program about refill on Lantus insulin to be filled out so they can ship supply for patient here. I spoke with patient to make sure he will need this and he does. (placed form in Dr. Josefine Class inbox) Patient did say that he wanted to ask Dr. Damita Dunnings if he needs to make any changes due to his sugar readings. The past 3 weeks fasting sugars has been 120 to 150. He does get a reading of 225 or 230 if he eats something he is not suppose to-like biscuits, etc. But if he behaves with food then the highest is 150 and lowest is 120 fasting. He is using Trulicity 1.61 mg once a week. Using Lantus 220 units once daily.

## 2018-10-27 NOTE — Telephone Encounter (Signed)
Patient advised and verbalized understanding. I placed the form for order on your desk yesterday. Were you able to fill it out?

## 2018-10-28 NOTE — Telephone Encounter (Signed)
Faxed form today

## 2018-10-28 NOTE — Telephone Encounter (Signed)
Form done. Thanks. 

## 2018-11-03 ENCOUNTER — Other Ambulatory Visit: Payer: Self-pay

## 2018-11-03 ENCOUNTER — Ambulatory Visit (INDEPENDENT_AMBULATORY_CARE_PROVIDER_SITE_OTHER): Payer: Self-pay | Admitting: Orthotics

## 2018-11-03 ENCOUNTER — Telehealth: Payer: Self-pay

## 2018-11-03 DIAGNOSIS — E1149 Type 2 diabetes mellitus with other diabetic neurological complication: Secondary | ICD-10-CM | POA: Diagnosis not present

## 2018-11-03 DIAGNOSIS — L84 Corns and callosities: Secondary | ICD-10-CM

## 2018-11-03 DIAGNOSIS — M779 Enthesopathy, unspecified: Secondary | ICD-10-CM

## 2018-11-03 DIAGNOSIS — E1140 Type 2 diabetes mellitus with diabetic neuropathy, unspecified: Secondary | ICD-10-CM | POA: Diagnosis not present

## 2018-11-03 NOTE — Telephone Encounter (Signed)
Left message for patient letting him know his insulin came in and is ready for pick up. Will hold on to this message to make sure he comes by

## 2018-11-03 NOTE — Progress Notes (Signed)

## 2018-11-03 NOTE — Telephone Encounter (Signed)
Noted. Thank you. It is in the refrigerator.

## 2018-11-03 NOTE — Telephone Encounter (Signed)
Patient returned Anastasiya's call.  Patient will come by tomorrow to pick up insulin.

## 2018-11-08 ENCOUNTER — Other Ambulatory Visit: Payer: Self-pay

## 2018-11-08 ENCOUNTER — Ambulatory Visit (INDEPENDENT_AMBULATORY_CARE_PROVIDER_SITE_OTHER)
Admission: RE | Admit: 2018-11-08 | Discharge: 2018-11-08 | Disposition: A | Discharge status: Home / Self Care | Payer: HMO | Source: Ambulatory Visit | Attending: Family Medicine | Admitting: Family Medicine

## 2018-11-08 DIAGNOSIS — I779 Disorder of arteries and arterioles, unspecified: Secondary | ICD-10-CM | POA: Diagnosis not present

## 2018-11-08 DIAGNOSIS — I712 Thoracic aortic aneurysm, without rupture: Secondary | ICD-10-CM | POA: Diagnosis not present

## 2018-11-08 MED ORDER — IOHEXOL 350 MG/ML SOLN
100.0000 mL | Freq: Once | INTRAVENOUS | Status: AC | PRN
  Administered 2018-11-08: 100 mL via INTRAVENOUS

## 2018-11-11 ENCOUNTER — Telehealth: Payer: Self-pay

## 2018-11-11 NOTE — Telephone Encounter (Signed)
Left message for patient to call back about results. 

## 2018-12-07 ENCOUNTER — Other Ambulatory Visit: Payer: Self-pay | Admitting: Family Medicine

## 2018-12-07 NOTE — Telephone Encounter (Signed)
Okay to continue.  rx sent.  Thanks.  

## 2018-12-07 NOTE — Telephone Encounter (Signed)
Electronic refill request Atorvastatin Last office visit 07/21/18 See drug warning with Gemfibrozil

## 2018-12-14 ENCOUNTER — Other Ambulatory Visit: Payer: Self-pay | Admitting: Family Medicine

## 2019-01-02 ENCOUNTER — Telehealth: Payer: Self-pay

## 2019-01-02 ENCOUNTER — Ambulatory Visit: Payer: HMO | Admitting: Adult Health

## 2019-01-02 ENCOUNTER — Encounter: Payer: Self-pay | Admitting: Adult Health

## 2019-01-02 ENCOUNTER — Other Ambulatory Visit: Payer: Self-pay

## 2019-01-02 VITALS — BP 166/94 | HR 88 | Temp 97.5°F | Ht 72.0 in | Wt 277.6 lb

## 2019-01-02 DIAGNOSIS — H401111 Primary open-angle glaucoma, right eye, mild stage: Secondary | ICD-10-CM | POA: Diagnosis not present

## 2019-01-02 DIAGNOSIS — E669 Obesity, unspecified: Secondary | ICD-10-CM

## 2019-01-02 DIAGNOSIS — H43813 Vitreous degeneration, bilateral: Secondary | ICD-10-CM | POA: Diagnosis not present

## 2019-01-02 DIAGNOSIS — I10 Essential (primary) hypertension: Secondary | ICD-10-CM | POA: Diagnosis not present

## 2019-01-02 DIAGNOSIS — E785 Hyperlipidemia, unspecified: Secondary | ICD-10-CM

## 2019-01-02 DIAGNOSIS — H401123 Primary open-angle glaucoma, left eye, severe stage: Secondary | ICD-10-CM | POA: Diagnosis not present

## 2019-01-02 DIAGNOSIS — E1169 Type 2 diabetes mellitus with other specified complication: Secondary | ICD-10-CM

## 2019-01-02 DIAGNOSIS — H9312 Tinnitus, left ear: Secondary | ICD-10-CM | POA: Diagnosis not present

## 2019-01-02 DIAGNOSIS — G510 Bell's palsy: Secondary | ICD-10-CM | POA: Diagnosis not present

## 2019-01-02 DIAGNOSIS — H2511 Age-related nuclear cataract, right eye: Secondary | ICD-10-CM | POA: Diagnosis not present

## 2019-01-02 DIAGNOSIS — E119 Type 2 diabetes mellitus without complications: Secondary | ICD-10-CM | POA: Diagnosis not present

## 2019-01-02 DIAGNOSIS — Z794 Long term (current) use of insulin: Secondary | ICD-10-CM | POA: Diagnosis not present

## 2019-01-02 DIAGNOSIS — Z961 Presence of intraocular lens: Secondary | ICD-10-CM | POA: Diagnosis not present

## 2019-01-02 DIAGNOSIS — H04123 Dry eye syndrome of bilateral lacrimal glands: Secondary | ICD-10-CM | POA: Diagnosis not present

## 2019-01-02 LAB — HM DIABETES EYE EXAM

## 2019-01-02 NOTE — Progress Notes (Signed)
I agree with the above plan 

## 2019-01-02 NOTE — Patient Instructions (Signed)
Possible referral to ENT for further evaluation and possible improvement of hearing concerns  Continue aspirin 81 mg daily  and lipitor  for secondary stroke prevention  Continue to follow up with PCP regarding cholesterol, blood pressure and diabetes management   Continue to monitor blood pressure at home  Maintain strict control of hypertension with blood pressure goal below 130/90, diabetes with hemoglobin A1c goal below 6.5% and cholesterol with LDL cholesterol (bad cholesterol) goal below 70 mg/dL. I also advised the patient to eat a healthy diet with plenty of whole grains, cereals, fruits and vegetables, exercise regularly and maintain ideal body weight.         Thank you for coming to see [place] at [facility]. I hope we have been able to provide you high quality care today.  You may receive a patient satisfaction survey over the next few weeks. We would appreciate your feedback and comments so that we may continue to improve ourselves and the health of our patients.

## 2019-01-02 NOTE — Telephone Encounter (Signed)
Patient was a no call/no show for their appointment today.   

## 2019-01-02 NOTE — Progress Notes (Signed)
PATIENT: [name] DOB: [birth year]  REASON FOR VISIT: follow up HISTORY FROM: patient  HISTORY OF PRESENT ILLNESS: .  HPI:  Initial visit 10/12/2016  PS: the patient was visiting [place] to look after his son-in-law who was sick from cancer in May this year when he developed sudden onset of pain in his right temple as well as occipital regions and neck strain notice he was unable to close his right eye and had right facial weakness. He refused to seek medical help there and upon arrival in [place] was eventually seen in the emergency room on 08/18/16. He underwent MRI scan of the brain which I personally reviewed and showed no acute abnormality. The patient Was treated with a two-week course of Valtrex and steroids which he has completed. He still has significant right-sided facial weakness. He is unable to close his eyes. He does use Systane eyedrops liberally during the day and uses Systane gel at night when sleeping. He denies any alteration of taste. He states that occasionally his bothered by loud sound. He denies any decreased sensation on the right side. He denies any hearing problems or ear pain. He has no prior history of Bell's palsy but does have a history of chickenpox as a child. He has no history of shingles. He denies any tick bite and does not have any pets who have tics. He has a remote history of runs and diplopia in 2016 for which he was seen in the hospital by Dr. 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 His diplopia cleared in 2-3 months. He denies significant headaches, vertigo, gait or balance problems. He does have diabetes and feels his sugars are difficult to control.He does have some redness in his right eye but denies significant pain or dryness of infection. He has not seen an eye doctor. Update 01/12/2017 MM: [name] is a 78-ear-old male with a history of Bell's palsy.  He returns today for follow-up. He does feel that  his symptoms have improved.  he states that he continues to have trouble with closure of the right eye. He states the lower lid is drooping. he has seen his ophthalmologist Dr. Katy Fitch. He was referred to a cosmetic surgeon at [facility]. He is considering surgery to improve the lower lid. Right now he continues to use artificial tears as well as lubrication to protect the eye.  The patient continues to have a slight facial droop on the right side of the mouth. He states that he is able to  hold liquids on that side although on occasion liquids will leak out of the right corner of the mouth. He states that he is talking better. He reports his is been a slow recovery. He returns today for an evaluation Update 05/11/2017 PS: Patient is seen today as he called complaining of new complaints of left-sided facial weakness for the last 2 weeks. Patient states that this began insidiously. He complains of pain behind his left ear. He has inability to close his left eye which has become slightly red. He had right-sided Bell's palsy in May 2018 and made gradual improvement with that. He had seen an ophthalmologist and underwent surgery on the retraction of the right lower eyelid which went well. He has been using artificial tears to keep his left eye moist but he has not seen his primary care physician or tried Valtrex of prednisone. He denies any tick bites and does not have any pets which  have tics. He denies any herpes zoster flareup. He had an MRI scan the brain on 08/18/16 but it was without contrast for his Bell's palsy. Previously had a microvascular sixth nerve palsy causing diplopia in June 2017. He denies any significant headaches, blurred vision, diplopia, vertigo, gait or balance problems.  [name] Update 07/19/2017 PS: returns for follow-up after last visit 2 months ago. He is accompanied by his wife. He states he did not notice much improvement in his left facial weakness. He continues to use artificial tears during the  day and Lacri-Lube at night. He saw ophthalmologist who did lower lid elevation surgery which is only partially helped.He finished a course of Valtrex as well as prednisone and noted improvement in his ear pain which went away fairly quickly.the patient was supposed to get an MRI scan of the brain done which had ordered but for unclear reason that has not yet been done. He denies any alteration of taste or hearing loss or hyperacusis.He denies any headaches or any new symptoms. He has no new complaints.  Interval history 01/18/2018: Patient returns today for a six-month follow-up visit.  After prior appointment, he did have blood work to rule out Lyme disease and sarcoidosis which were both negative.  On 09/08/2017, patient was seen by Dr. Iran Planas with plastic and reconstructive surgery for follow-up on prior eye surgery.  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  Patient states he does continue to have left facial droop, eyelid droop and "helicopter noise" in his left ear but all have been improving and he does state some days he has no symptoms and other days the symptoms will be present.  He does continue to follow with Dr. Katy Fitch for eye management and routine monitoring.  He does have intermittent headaches that are located behind both of his ears but will take OTC pain reliever and it subsides.  He denies any other worsening neurological symptoms.  No concerns today.   Update 01/02/2019: [name] is being seen today for Bell's palsy follow-up.  Residual mild left facial droop, left eyelid droop and "fan running" consistently in his left ear.  All symptoms have been stable without worsening.  Left ear concerns affect his hearing with greater difficulty hearing adequately and can interfere with sleep.  Blood pressure today 150/80s.  Continues on aspirin  34m and lipitor for secondary stroke prevention without side effects. Glucose levels have been stable. No concerns at this time.     REVIEW OF SYSTEMS: Out of a complete 14 system review of symptoms, the patient complains only of the following symptoms, and all other reviewed systems are negative. See HPI  ALLERGIES: Allergies  Allergen Reactions  . Morphine And Related Nausea And Vomiting    Causes nausea & vomiting Causes nausea & vomiting  . Codeine Sulfate Other (See Comments)    intolerant  . Metformin And Related Other (See Comments)    Gi intolerance, not an allergy    HOME MEDICATIONS: Outpatient Medications Prior to Visit  Medication Sig Dispense Refill  . allopurinol (ZYLOPRIM) 300 MG tablet TAKE 1/2 TABLET BY MOUTH EVERY MORNING 45 tablet 1  . amLODipine (NORVASC) 10 MG tablet Take 1 tablet (10 mg total) by mouth daily. 90 tablet 3  . aspirin 81 MG tablet Take 81 mg by mouth daily.    .Ronnie Kitchenatorvastatin (LIPITOR) 10 MG tablet TAKE  1 TABLET BY MOUTH AT BEDTIME 90 tablet 3  . Blood Glucose Monitoring Suppl (ONE TOUCH ULTRA 2) w/Device KIT 1 each daily by Does not apply route. 1 each 0  . CINNAMON PO Take 200 mg by mouth daily.     . diclofenac sodium (VOLTAREN) 1 % GEL     . dorzolamide-timolol (COSOPT) 22.3-6.8 MG/ML ophthalmic solution Place 1 drop into the left eye 2 (two) times daily.     . Dulaglutide (TRULICITY) 7.49 SW/9.6PR SOPN Inject 0.75 mg into the skin once a week. Stop [place] when starting trulicity 15 pen 3  . gemfibrozil (LOPID) 600 MG tablet TAKE 1 TABLET BY MOUTH TWICE DAILY 180 tablet 2  . glucose blood (ONE TOUCH ULTRA TEST) test strip Check blood sugar before and after each meal and as directed. Dx E11.9 200 each 5  . Insulin Glargine (LANTUS SOLOSTAR) 100 UNIT/ML Solostar Pen Inject 220 Units into the skin every morning. And pen needles 2/day    . Insulin Pen Needle (B-D UF III MINI PEN NEEDLES) 31G X 5 MM MISC 1 each by Does not apply route daily. Use  to administer insulin daily 100 each 3  . latanoprost (XALATAN) 0.005 % ophthalmic solution Place 1 drop into both eyes at bedtime.    [name] losartan (COZAAR) 100 MG tablet TAKE 1 TABLET BY MOUTH DAILY 90 tablet 2  . meloxicam (MOBIC) 15 MG tablet as needed.     . metoprolol succinate (TOPROL-XL) 25 MG 24 hr tablet Take 1 tablet (25 mg total) by mouth daily. 90 tablet 3  . Multiple Vitamins-Minerals (VITRUM 50+ SENIOR MULTI PO) Take 1 tablet by mouth daily.     [name] ofloxacin (OCUFLOX) 0.3 % ophthalmic solution     . oxybutynin (DITROPAN-XL) 10 MG 24 hr tablet Take 10 mg by mouth daily.     No facility-administered medications prior to visit.     PAST MEDICAL HISTORY: Past Medical History:  Diagnosis Date  . Arthritis    arthirtis all over  . Bell's palsy   . Cataract 2020   bilateral eyes -pending surgery June 2020  . Concussion    multiple- college football player  . Diabetes mellitus type 2 in obese ([place])   . Frequent urination at night   . GERD (gastroesophageal reflux disease)    occ indigestion  . Glaucoma    both eyes  . Hyperlipemia   . Hypertension   . Kidney stones   . PONV (postoperative nausea and vomiting)   . Primary localized osteoarthritis of right hip   . Primary osteoarthritis of right knee   . Restless leg syndrome   . Right knee DJD   . Spinal stenosis of lumbar region with radiculopathy    s/p L4/L5 transforaminal blocks    PAST SURGICAL HISTORY: Past Surgical History:  Procedure Laterality Date  . ABDOMINAL HERNIA REPAIR    . ANTERIOR HIP REVISION Right 01/08/2015   Procedure: RIGHT ANTERIOR HIP REVISION;  Surgeon: Renette Butters, MD;  Location: [place];  Service: Orthopedics;  Laterality: Right;  . BACK SURGERY    . CANTHOPLASTY Right 02/09/2017   Procedure: RIGHT LATERAL CANTHOPLASTY;  Surgeon: Irene Limbo, MD;  Location: [place];  Service: Plastics;  Laterality: Right;  . CANTHOPLASTY Left 06/29/2017   Procedure: LEFT LOWER  CANTHOPLASTY;  Surgeon: Irene Limbo, MD;  Location: [place];  Service: Plastics;  Laterality: Left;  . COLONOSCOPY    . ELBOW ARTHROSCOPY  1990  right  . ELBOW SURGERY     right  . HERNIA REPAIR  1980   inguinal  . HIP CLOSED REDUCTION Right 11/09/2014   Procedure: CLOSED REDUCTION HIP, s/p total hip 8/9;  Surgeon: Ninetta Lights, MD;  Location: [place];  Service: Orthopedics;  Laterality: Right;  . JOINT REPLACEMENT  2008   knee  . LITHOTRIPSY    . [place] SURGERY     2015  . RECONSTRUCTION OF EYELID    . REVISION TOTAL HIP ARTHROPLASTY Right 01/08/2015  . SHOULDER ARTHROSCOPY  1980   right  . SHOULDER SURGERY     right  . STONE EXTRACTION WITH BASKET  2010  . TOTAL HIP ARTHROPLASTY Right 11/06/2014   Procedure: RIGHT TOTAL HIP ARTHROPLASTY ANTERIOR APPROACH;  Surgeon: Renette Butters, MD;  Location: [place];  Service: Orthopedics;  Laterality: Right;  . TOTAL KNEE ARTHROPLASTY Left 07/2006   left knee  . TOTAL KNEE ARTHROPLASTY Right 01/08/2014   Procedure: RIGHT TOTAL KNEE ARTHROPLASTY;  Surgeon: Lorn Junes, MD;  Location: [place];  Service: Orthopedics;  Laterality: Right;    FAMILY HISTORY: Family History  Problem Relation Age of Onset  . Hypertension Mother   . Kidney disease Mother   . Hypertension Father   . Diabetes Mellitus II Father   . Colon polyps Father   . Diabetes Mellitus II Brother   . Diabetes Mellitus II Brother   . Cancer - Prostate Brother   . Colon cancer Neg Hx     SOCIAL HISTORY: Social History   Socioeconomic History  . Marital status: Married    Spouse name: Not on file  . Number of children: Not on file  . Years of education: Not on file  . Highest education level: Not on file  Occupational History  . Not on file  Social Needs  . Financial resource strain: Not on file  . Food insecurity    Worry: Not on file    Inability: Not on file  . Transportation needs    Medical: Not on file    Non-medical: Not  on file  Tobacco Use  . Smoking status: Never Smoker  . Smokeless tobacco: Never Used  Substance and Sexual Activity  . Alcohol use: Yes    Comment: glass of wine every 3-4 months  . Drug use: No  . Sexual activity: Never  Lifestyle  . Physical activity    Days per week: Not on file    Minutes per session: Not on file  . Stress: Not on file  Relationships  . Social Herbalist on phone: Not on file    Gets together: Not on file    Attends religious service: Not on file    Active member of club or organization: Not on file    Attends meetings of clubs or organizations: Not on file    Relationship status: Not on file  . Intimate partner violence    Fear of current or ex partner: Not on file    Emotionally abused: Not on file    Physically abused: Not on file    Forced sexual activity: Not on file  Other Topics Concern  . 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    1 son, 1 daughter, both out of state      PHYSICAL EXAM  Vitals:  01/02/19 1102  BP: (!) 166/94  Pulse: 88  Temp: (!) 97.5 F (36.4 C)  TempSrc: Oral  Weight: 277 lb 9.6 oz (125.9 kg)  Height: 6' (1.829 m)   Body mass index is 37.65 kg/m.  Generalized: Obese elderly Caucasian male not in distress in no acute distress mild conjunctival injection of the left lower eyelid. Hearing intact bilaterally.  Neurological examination  Mentation: Alert oriented to time, place, history taking. Follows all commands speech and language fluent Cranial nerve II-XII: Pupils were equal round reactive to light. Extraocular movements were full, visual field were full on confrontational test.  Uvula tongue midline. Head turning and shoulder shrug  were normal and symmetric. 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 Mild left facial weakness including forehead, drooping of the left lower eyelid. Weakness of nasolabial fold and puffing cheeks -all  stable without change.  Weber test tone louder and unaffected right ear.  Rinne test AC>BC bilaterally Motor: The motor testing reveals 5 over 5 strength of all 4 extremities. Good symmetric motor tone is noted throughout.  Sensory: Sensory testing is intact to soft touch on all 4 extremities. No evidence of extinction is noted.  Coordination: Cerebellar testing reveals good finger-nose-finger and heel-to-shin bilaterally.  Gait and station:  Patient has a slight limp when he ambulates. Tandem gait not attempted. Reflexes: Deep tendon reflexes are symmetric and normal bilaterally.   DIAGNOSTIC DATA (LABS, IMAGING, TESTING) - I reviewed patient records, labs, notes, testing and imaging myself where available.  Lab Results  Component Value Date   WBC 7.4 07/15/2018   HGB 14.4 07/15/2018   HCT 41.6 07/15/2018   MCV 89.8 07/15/2018   PLT 262.0 07/15/2018      Component Value Date/Time   NA 140 07/15/2018 0935   K 4.1 07/15/2018 0935   CL 102 07/15/2018 0935   CO2 30 07/15/2018 0935   GLUCOSE 225 (H) 07/15/2018 0935   BUN 26 (H) 10/13/2018 1101   CREATININE 1.25 10/13/2018 1101   CREATININE 1.44 12/21/2014   CALCIUM 9.7 07/15/2018 0935   PROT 6.9 07/15/2018 0935   ALBUMIN 3.8 07/15/2018 0935   AST 19 07/15/2018 0935   AST 16 12/21/2014   ALT 27 07/15/2018 0935   ALT 15 12/21/2014   ALKPHOS 36 (L) 07/15/2018 0935   BILITOT 0.5 07/15/2018 0935   GFRNONAA 50 (L) 06/24/2017 1200   GFRAA 58 (L) 06/24/2017 1200   Lab Results  Component Value Date   CHOL 156 07/15/2018   HDL 33.10 (L) 07/15/2018   LDLCALC 82 05/09/2014   LDLDIRECT 52.0 07/15/2018   TRIG 391.0 (H) 07/15/2018   CHOLHDL 5 07/15/2018   Lab Results  Component Value Date   HGBA1C 9.6 (H) 07/15/2018    MRI HEAD WO CONTRAST 05/14/17 ([facility]) IMPRESSION: Moderate cerebral atrophy. Chronic microvascular ischemic changes in the white matter. No acute abnormality.    ASSESSMENT AND PLAN 78 y.o. year old male  has  a past medical history of Arthritis, Bell's palsy, Cataract (2020), Concussion, Diabetes mellitus type 2 in obese (H[facility]), Frequent urination at night, GERD (gastroesophageal reflux disease), Glaucoma, Hyperlipemia, Hypertension, Kidney stones, PONV (postoperative nausea and vomiting), Primary localized osteoarthritis of right hip, Primary osteoarthritis of right knee, Restless leg syndrome, Right knee DJD, and Spinal stenosis of lumbar region with radiculopathy. here with:  Possible sensorineural hearing loss with tinnitus.  Referral placed to ENT for further evaluation and possible intervention.  Advised patient that possibility of no further treatment options and patient verbalized understanding but would like further  evaluation.  Continue to follow with ophthalmology as scheduled.  Continue aspirin and Lipitor for secondary stroke prevention and ongoing follow-up with PCP for HTN, HLD and DM management.    Recommend follow-up as needed as overall stable from a neurological standpoint    Frann Rider, [facility]  [facility] [address] [place], [place] [address]  Phone [phone] Fax [phone] Note: This document was prepared with digital dictation and possible smart phrase technology. Any transcriptional errors that result from this process are unintentional.

## 2019-01-09 ENCOUNTER — Other Ambulatory Visit: Payer: Self-pay

## 2019-01-09 NOTE — Patient Outreach (Signed)
  [facility]) Care Management Chronic Special Needs Program    01/09/2019  Name: [name], DOB: 30-Jul-1940  MRN: [id number]   [name] is enrolled in a chronic special needs plan. RNCM called to follow up. No answer. HIPAA compliant message left.  Plan: RNCM will follow up within 1-2 weeks if no return call.  Thea Silversmith, RN, MSN, [place] [phone]

## 2019-01-10 ENCOUNTER — Encounter: Payer: Self-pay | Admitting: Family Medicine

## 2019-01-10 ENCOUNTER — Ambulatory Visit (INDEPENDENT_AMBULATORY_CARE_PROVIDER_SITE_OTHER): Payer: HMO | Admitting: Family Medicine

## 2019-01-10 VITALS — BP 150/90 | HR 90 | Temp 97.9°F | Wt 274.0 lb

## 2019-01-10 DIAGNOSIS — E669 Obesity, unspecified: Secondary | ICD-10-CM

## 2019-01-10 DIAGNOSIS — E1169 Type 2 diabetes mellitus with other specified complication: Secondary | ICD-10-CM | POA: Diagnosis not present

## 2019-01-10 LAB — POCT GLYCOSYLATED HEMOGLOBIN (HGB A1C): Hemoglobin A1C: 8.4 % — AB (ref 4.0–5.6)

## 2019-01-10 MED ORDER — TRULICITY 0.75 MG/0.5ML ~~LOC~~ SOAJ: 0.7500 mg | SUBCUTANEOUS | 3 refills | Status: DC

## 2019-01-10 NOTE — Progress Notes (Signed)
Diabetes:  Using medications without difficulties: yes Hypoglycemic episodes:no Hyperglycemic episodes:no Feet problems: doing well with diabetic shoes. Some night pain is wearing flip flops.   Blood Sugars averaging: 120-300, usually ~200 in spite of diet and exercise.  eye exam within last year: yes A1c improved, d/w pt.   On trulicity weekly with [id number] units once a day.    He has ortho f/u pending re: R hip pain.  He missed a step at home and fell, cautions d/w pt.  Dull ache sitting down, variable pain walking.  He is trying to use an exercise bike.  The fall was 4 weeks ago.    He was asking about getting a freestyle libre 14 day meter.  Using [employer].  He is checking his sugar about 6-7 times a day.  He has fluctuating sugar.   He has some rhinorrhea, using OTC nasal spray but not yet back on saline solution.  Going on for about 2 months.  No fevers.  Runny and stuffy nose.  He has seen ENT previously and we are requesting information from that clinic.  PMH and SH reviewed  Meds, vitals, and allergies reviewed.   ROS: Per HPI unless specifically indicated in ROS section   GEN: nad, alert and oriented HEENT: mucous membranes moist, clearly rhinorrhea, R>L nostril, OP wnl, TM wnl B NECK: supple w/o LA CV: rrr. PULM: ctab, no inc wob ABD: soft, +bs EXT: no edema SKIN: no acute rash

## 2019-01-10 NOTE — Patient Instructions (Addendum)
You deserve a lot of credit.  Thanks for your effort.    Don't change your meds for now and let me see about getting you the other meter.  Try nasal saline and then restart plain claritin if needed.  Plan on recheck in about 3 months with A1c at the visit.  Take care.  Glad to see you.

## 2019-01-12 NOTE — Assessment & Plan Note (Signed)
At this point the plan would be for him to see orthopedics so he can hopefully get his hip pain improved so that he will be able to work more on exercise as that would help his sugar.  Plan on recheck A1c in a few months.  See after visit summary.  He is going to need refills on insulin and I will check on that here at the clinic.  He was asking about 14-day freestyle libre meter.  He is checking his sugar 6-7 times per day due to fluctuating sugar.  We will see about getting this set up.  As a separate issue we will check with ENT about the plan.  A1c improved.  Discussed with patient at office visit.  >25 minutes spent in face to face time with patient, >50% spent in counselling or coordination of care.

## 2019-01-13 ENCOUNTER — Telehealth: Payer: Self-pay

## 2019-01-13 MED ORDER — FREESTYLE LIBRE 14 DAY SENSOR MISC: 1.0000 | 3 refills | Status: DC

## 2019-01-13 MED ORDER — FREESTYLE LIBRE 14 DAY READER DEVI: 1.0000 | Freq: Every day | 1 refills | Status: DC

## 2019-01-13 NOTE — Telephone Encounter (Signed)
[facility] ENT to get notes but patient has not been seen yet, they have called patient but found out they had a wrong number for patient. Their office will be reaching out to the patient to get him scheduled. There are notes from [place] office in the system also about patient and his symptoms. Let me know if there was another office that I was suppose to call.  Also form for insulin to be filled is given to Dr Damita Dunnings to fill out

## 2019-01-13 NOTE — Telephone Encounter (Signed)
1) I am trying to reach [facility] ENT to request notes on patient but so far can not get through. Will continue to call. 2) Yes, just need to send RX for 14 day freestyle libre meter and then we would attempt PA if that is needed. Not sure if it would be approved. 3) I called [employer] assistance program at [phone] to request refill on patient's insulin. They are faxing a form to be filled out for the RX and then can work on sending medication.  I left a message for patient to call back to be advised of everything.

## 2019-01-13 NOTE — Telephone Encounter (Signed)
I sent the meter rx.  I will await response/other info.  Thanks.

## 2019-01-13 NOTE — Addendum Note (Signed)
Addended by: Tonia Ghent on: 01/13/2019 01:57 PM   Modules accepted: Orders

## 2019-01-13 NOTE — Telephone Encounter (Signed)
From Dr Damita Dunnings: 1) Please check with ENT, [name], and see what her plan is about his left ear.    2) He wanted to get a 14-day freestyle libre glucose meter. He has been checking his sugar 6-7 times per day due to varying blood sugars. What do I need to do to get that set up? Can I just send an order to the pharmacy or do we need to do something else? It may need a PA.   3) He normally gets his insulin filled through the clinic here and he is going to need a refill on that. What needs to happen for him to get his refill?   Thanks.

## 2019-01-15 NOTE — Telephone Encounter (Signed)
Thank you for checking on all of this.  I will await the ENT notes.  With the way it was documented in the EMR, I thought McCue was at the ENT clinic.  I filled out the form for the insulin.  Please scan and send and update patient.  Thanks.

## 2019-01-16 NOTE — Telephone Encounter (Signed)
Pt left v/m this morning requesting cb from missed call on 01/13/19.

## 2019-01-16 NOTE — Telephone Encounter (Signed)
Patient advised.   Patient says he will contact [facility] ENT.  Form for insulin faxed.

## 2019-01-19 ENCOUNTER — Telehealth: Payer: Self-pay | Admitting: *Deleted

## 2019-01-19 ENCOUNTER — Other Ambulatory Visit: Payer: Self-pay

## 2019-01-19 DIAGNOSIS — T8484XA Pain due to internal orthopedic prosthetic devices, implants and grafts, initial encounter: Secondary | ICD-10-CM | POA: Diagnosis not present

## 2019-01-19 NOTE — Telephone Encounter (Signed)
Patient returned your call and I don't see a reason for the call other than when I spoke with him on 01/13/2019.  If you still need to speak to him, please try again.

## 2019-01-19 NOTE — Patient Outreach (Signed)
  [facility]) Care Management Chronic Special Needs Program    01/19/2019  Name: GEORFFREY FOMBY, DOB: 08/27/1940  MRN: [id number]   Mr. Joncarlo Tucciarone is enrolled in a chronic special needs plan. RNCM called to follow up. No answer. HIPAA compliant message left. 2nd attempt.  Plan: RNCM will follow up within 2-3 weeks.  Thea Silversmith, RN, MSN, [place] [phone]

## 2019-01-19 NOTE — Patient Outreach (Addendum)
[facility]) Care Management Chronic Special Needs Program  01/19/2019  Name: [name] DOB: 27-Dec-1940  MRN: [id number]  [name] is enrolled in a chronic special needs plan for Diabetes. Reviewed and updated care plan.  Subjective: client reports going to orthopedic provider today regarding pain in right hip. Client reports it is the hip that he has gotten replaced in the past. He reports ongoing pain the past 6-8 months. . He reports he has to go back next week for additional evaluation. He reports pain is tolerable at this moment a "3" on the pain scale. Client states he will call RNCM back to update regarding orthopedic office visit.  Client states he is not able to exercise like he would like to due to pain in hip. Client reports blood sugars greater than 150. He states he could "cut down on the carbs" to help with blood sugar readings. Last primary care visit 01/10/2019. A1C 8.4 improved from 9.6 in April 2020.   Goals Addressed            This Visit's Progress   . COMPLETED: Client understands the importance of follow-up with providers by attending scheduled visits       Voiced understanding of importance of follow up.    . COMPLETED: Client will report abillity to obtain Medications within the next 3 months.       Denies any issues or problems with obtaining medications.    . Client will use Assistive Devices as needed and verbalize understanding of device use   On track    Obtain new Festus    . Client will verbalize knowledge of self management of Hypertension as evidences by BP reading of 140/90 or less; or as defined by provider   On track   . Diabetes Patient stated goal: Weight Loss (pt-stated)   Not on track    Stay as active as possible. Follow recommendations from orthopedic doctor for recommended activity.      . COMPLETED: Increase physical activity   Not on track    Starting 10/14/2015, I will continue to exercise for  at least 30 minutes 4-5 days per week.     . Increase physical activity   Not on track    01/19/2019 activity per provider recommendation.    . COMPLETED: Maintain timely refills of diabetic medication as prescribed within the year .       Denies any issues with obtaining medications.    . COMPLETED: Obtain annual  Lipid Profile, LDL-C       Done 07/15/2018    . COMPLETED: Obtain Annual Eye (retinal)  Exam        Done 07/05/2018    . COMPLETED: Obtain Annual Foot Exam       Done 01/10/2019    . Obtain annual screen for micro albuminuria (urine) , nephropathy (kidney problems)   On track   . COMPLETED: Obtain Hemoglobin A1C at least 2 times per year       Done 04/19/2018; 07/05/2018;01/10/2019    . COMPLETED: Visit Primary Care Provider or Endocrinologist at least 2 times per year        Has completed provider visit at least 2 times/year.      Covid 19 precautions reviewed. RNCM encouraged client to call 24 hour nurse advice line. Reiterated health care concierge available for benefits questions.   Plan: RNCM will send updated care plan to client, send updated care plan to provider. Chronic care management  coordinator will outreach within 6 months.  Thea Silversmith, RN, MSN, [place] [phone]    .

## 2019-01-20 NOTE — Telephone Encounter (Signed)
Noted. Yes the last time I called was 01/13/2019 to advise him of everything in that note that Lugene already did. No further follow up is needed at this time. Thank you

## 2019-01-23 ENCOUNTER — Ambulatory Visit: Payer: Self-pay

## 2019-01-24 DIAGNOSIS — M25551 Pain in right hip: Secondary | ICD-10-CM | POA: Diagnosis not present

## 2019-01-24 DIAGNOSIS — Z96641 Presence of right artificial hip joint: Secondary | ICD-10-CM | POA: Diagnosis not present

## 2019-01-27 DIAGNOSIS — H6122 Impacted cerumen, left ear: Secondary | ICD-10-CM | POA: Diagnosis not present

## 2019-01-27 DIAGNOSIS — H9192 Unspecified hearing loss, left ear: Secondary | ICD-10-CM | POA: Diagnosis not present

## 2019-01-27 DIAGNOSIS — H9312 Tinnitus, left ear: Secondary | ICD-10-CM | POA: Diagnosis not present

## 2019-01-27 DIAGNOSIS — R29810 Facial weakness: Secondary | ICD-10-CM | POA: Diagnosis not present

## 2019-01-31 ENCOUNTER — Telehealth: Payer: Self-pay

## 2019-01-31 ENCOUNTER — Other Ambulatory Visit: Payer: Self-pay | Admitting: *Deleted

## 2019-01-31 MED ORDER — LANTUS SOLOSTAR 100 UNIT/ML ~~LOC~~ SOPN: 220.0000 [IU] | PEN_INJECTOR | SUBCUTANEOUS | 0 refills | Status: DC

## 2019-01-31 NOTE — Addendum Note (Signed)
Addended by: Tonia Ghent on: 01/31/2019 11:57 AM   Modules accepted: Orders

## 2019-01-31 NOTE — Telephone Encounter (Signed)
Patient advised.  Rx left at front desk for pick up. 

## 2019-01-31 NOTE — Telephone Encounter (Signed)
All I know to do it is to print off his current rx and have him hold it for now.  He can fill it if needed.  I hope his rx will come in the meantime.  Thanks.

## 2019-01-31 NOTE — Telephone Encounter (Signed)
Patient called checking on his insulin shipment from [employer]. I called them and checked on this. This has not been processed and representative apologized several times for the delay. This request will be processed today and shipment should be here within 3 to 5 business days. Advised patient of everything. Patient states he has about 3 units of insulin left which usually lasts him for 3 days. If shipment does not get here before he runs out any recommendations for the patient while waiting and been out of insulin for 2 days or more?  Also advised patient per [employer] rep that patient can re apply for the program around February or March of 2021. At this time patient only needs to spend $500 out of his pocket before been approved for the program unless his earnings/income changes per household in 2021. So patient can be helped sooner than May (that is when he applied this year). I relayed all of the information to the patient and he verbalized understanding.

## 2019-02-01 DIAGNOSIS — H903 Sensorineural hearing loss, bilateral: Secondary | ICD-10-CM | POA: Diagnosis not present

## 2019-02-01 DIAGNOSIS — H9312 Tinnitus, left ear: Secondary | ICD-10-CM | POA: Diagnosis not present

## 2019-02-01 NOTE — Telephone Encounter (Signed)
Pt came to pick up prescription but I did not see the prescription in the folders (blue or yellow). I asked Vallarie Mare to see if she seen it in your office but we didn't see it there. Please advise. Pt said he will come back tomorrow 02/02/19.

## 2019-02-02 ENCOUNTER — Other Ambulatory Visit: Payer: Self-pay | Admitting: Orthopedic Surgery

## 2019-02-02 ENCOUNTER — Telehealth: Payer: Self-pay | Admitting: *Deleted

## 2019-02-02 ENCOUNTER — Other Ambulatory Visit (HOSPITAL_COMMUNITY): Payer: Self-pay | Admitting: Orthopedic Surgery

## 2019-02-02 DIAGNOSIS — M25551 Pain in right hip: Secondary | ICD-10-CM

## 2019-02-02 NOTE — Telephone Encounter (Signed)
Found Rx incorrectly filed.  Patient's insulin has arrived and patient advised.

## 2019-02-03 ENCOUNTER — Telehealth: Payer: Self-pay | Admitting: *Deleted

## 2019-02-07 ENCOUNTER — Encounter (HOSPITAL_COMMUNITY)
Admission: RE | Admit: 2019-02-07 | Discharge: 2019-02-07 | Disposition: A | Discharge status: Home / Self Care | Payer: HMO | Source: Ambulatory Visit | Attending: Orthopedic Surgery | Admitting: Orthopedic Surgery

## 2019-02-07 ENCOUNTER — Other Ambulatory Visit: Payer: Self-pay

## 2019-02-07 DIAGNOSIS — M25551 Pain in right hip: Secondary | ICD-10-CM | POA: Insufficient documentation

## 2019-02-07 DIAGNOSIS — Z96641 Presence of right artificial hip joint: Secondary | ICD-10-CM | POA: Diagnosis not present

## 2019-02-07 MED ORDER — TECHNETIUM TC 99M MEDRONATE IV KIT
20.0000 | PACK | Freq: Once | INTRAVENOUS | Status: AC | PRN
  Administered 2019-02-07: 20 via INTRAVENOUS

## 2019-02-10 ENCOUNTER — Telehealth: Payer: Self-pay

## 2019-02-10 MED ORDER — TRULICITY 0.75 MG/0.5ML ~~LOC~~ SOAJ: 0.7500 mg | SUBCUTANEOUS | 3 refills | Status: DC

## 2019-02-10 NOTE — Telephone Encounter (Signed)
I thought this had already been sent.  I redid the rx.  Please let me know if that doesn't take care of it.  I thank all involved.

## 2019-02-10 NOTE — Telephone Encounter (Signed)
Pt left v/m wanting to know status of trulicity; pt wants to know if Dr Damita Dunnings ordered the trulicity but pt has not gotten med yet. On pts med list [id number] Truclicity [id number] # 15 pens x 3 is on med list but was not sent to a pharmacy; the order class has no print.Please advise.

## 2019-02-10 NOTE — Telephone Encounter (Signed)
Left detailed message on voicemail. DPR 

## 2019-02-16 DIAGNOSIS — M25551 Pain in right hip: Secondary | ICD-10-CM | POA: Diagnosis not present

## 2019-02-21 ENCOUNTER — Telehealth: Payer: Self-pay | Admitting: Family Medicine

## 2019-02-21 ENCOUNTER — Other Ambulatory Visit: Payer: Self-pay | Admitting: Family Medicine

## 2019-02-21 MED ORDER — TRULICITY 0.75 MG/0.5ML ~~LOC~~ SOAJ: 0.7500 mg | SUBCUTANEOUS | 3 refills | Status: DC

## 2019-02-21 NOTE — Telephone Encounter (Signed)
Faxed

## 2019-02-21 NOTE — Telephone Encounter (Signed)
rx printed, please send.  Thanks.

## 2019-02-21 NOTE — Telephone Encounter (Signed)
Note from [employer].  Good Morning! Patient contacted me stating that his Trulicity script was sent in to his local pharmacy when it needs to be called or faxed into [employer] patient assistance @ Phone- [phone] or fax - [phone]---Thanks so much!  ===============

## 2019-02-22 ENCOUNTER — Ambulatory Visit (INDEPENDENT_AMBULATORY_CARE_PROVIDER_SITE_OTHER): Payer: HMO | Admitting: Family Medicine

## 2019-02-22 ENCOUNTER — Encounter: Payer: Self-pay | Admitting: Family Medicine

## 2019-02-22 ENCOUNTER — Other Ambulatory Visit: Payer: Self-pay

## 2019-02-22 VITALS — BP 158/84 | HR 85 | Temp 98.7°F | Ht 72.0 in | Wt 282.5 lb

## 2019-02-22 DIAGNOSIS — L0231 Cutaneous abscess of buttock: Secondary | ICD-10-CM | POA: Diagnosis not present

## 2019-02-22 DIAGNOSIS — L03317 Cellulitis of buttock: Secondary | ICD-10-CM | POA: Diagnosis not present

## 2019-02-22 MED ORDER — DOXYCYCLINE HYCLATE 100 MG PO TABS: 100.0000 mg | ORAL_TABLET | Freq: Two times a day (BID) | ORAL | 0 refills | Status: AC

## 2019-02-22 NOTE — Progress Notes (Signed)
Ronnie Garrod T. Rabab Currington, MD Primary Care and [place] at [facility] [place], [address] Phone: [phone]  FAX: [phone]  [name] - 78 y.o. male  MRN [id number]  Date of Birth: 05-29-1940  Visit Date: 02/22/2019  PCP: Tonia Ghent, MD  Referred by: Tonia Ghent, MD  Chief Complaint  Patient presents with  . Recurrent Skin Infections    on Buttocks   Subjective:   [name] is a 78 y.o. very pleasant male patient who presents with the following:  Recurrent boils: He is a nice gentleman who presents with some redness and pain on his gluteal cleft just lateral to this on the left.  This has self busted, but now it is red and more painful.  He did have some antibiotic cream/ointment that his wife gave him, but this is not really significantly improved it.  This is gotten worse over the last week.  Past Medical History, Surgical History, Social History, Family History, Problem List, Medications, and Allergies have been reviewed and updated if relevant.  Patient Active Problem List   Diagnosis Date Noted  . Gout 07/22/2018  . HTN (hypertension) 06/11/2017  . Hyperlipemia   . Lower urinary tract symptoms (LUTS) 03/04/2017  . Bell's palsy 10/11/2016  . Paresthesia 05/28/2016  . Administrative encounter 04/22/2016  . Cough 04/22/2016  . Allergic rhinitis 03/12/2016  . 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 Erectile dysfunction 06/11/2015  . Rhinorrhea 03/13/2015  . 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 Dislocation, hip ([place]) 11/09/2014  . DJD (degenerative joint disease) 11/06/2014  . Primary osteoarthritis of right knee   . Primary localized osteoarthritis of right hip   . Spinal stenosis of lumbar region with radiculopathy   . Diabetes mellitus type 2 in obese ([place])   . PONV (postoperative nausea and vomiting)   . GERD (gastroesophageal reflux disease)   . Frequent urination at night   . Kidney  stones   . Arthritis   . Neuromuscular disorder ([place])   . Chronic calculus cholecystitis 12/31/2011    Past Medical History:  Diagnosis Date  . Arthritis    arthirtis all over  . Bell's palsy   . Cataract 2020   bilateral eyes -pending surgery June 2020  . Concussion    multiple- college football player  . Diabetes mellitus type 2 in obese ([place])   . Frequent urination at night   . GERD (gastroesophageal reflux disease)    occ indigestion  . Glaucoma    both eyes  . Hyperlipemia   . Hypertension   . Kidney stones   . PONV (postoperative nausea and vomiting)   . Primary localized osteoarthritis of right hip   . Primary osteoarthritis of right knee   . Restless leg syndrome   . Right knee DJD   . Spinal stenosis of lumbar region with radiculopathy    s/p L4/L5 transforaminal blocks    Past Surgical History:  Procedure Laterality Date  . ABDOMINAL HERNIA REPAIR    . ANTERIOR HIP REVISION Right 01/08/2015   Procedure: RIGHT ANTERIOR HIP REVISION;  Surgeon: Renette Butters, MD;  Location: [place];  Service: Orthopedics;  Laterality: Right;  . BACK SURGERY    . CANTHOPLASTY Right 02/09/2017   Procedure: RIGHT LATERAL CANTHOPLASTY;  Surgeon: Irene Limbo, MD;  Location: [facility];  Service: Plastics;  Laterality: Right;  . CANTHOPLASTY Left 06/29/2017   Procedure: LEFT LOWER  CANTHOPLASTY;  Surgeon: Irene Limbo, MD;  Location: [place];  Service: Plastics;  Laterality: Left;  . CATARACT EXTRACTION Left   . COLONOSCOPY    . ELBOW ARTHROSCOPY  1990   right  . ELBOW SURGERY     right  . HERNIA REPAIR  1980   inguinal  . HIP CLOSED REDUCTION Right 11/09/2014   Procedure: CLOSED REDUCTION HIP, s/p total hip 8/9;  Surgeon: Ninetta Lights, MD;  Location: [place] [facility] [place];  Service: Orthopedics;  Laterality: Right;  . JOINT REPLACEMENT  2008   knee  . LITHOTRIPSY    . [employer] SURGERY     2015  . RECONSTRUCTION OF EYELID    . REVISION TOTAL  HIP ARTHROPLASTY Right 01/08/2015  . SHOULDER ARTHROSCOPY  1980   right  . SHOULDER SURGERY     right  . STONE EXTRACTION WITH BASKET  2010  . TOTAL HIP ARTHROPLASTY Right 11/06/2014   Procedure: RIGHT TOTAL HIP ARTHROPLASTY ANTERIOR APPROACH;  Surgeon: Renette Butters, MD;  Location: [facility];  Service: Orthopedics;  Laterality: Right;  . TOTAL KNEE ARTHROPLASTY Left 07/2006   left knee  . TOTAL KNEE ARTHROPLASTY Right 01/08/2014   Procedure: RIGHT TOTAL KNEE ARTHROPLASTY;  Surgeon: Lorn Junes, MD;  Location: [place];  Service: Orthopedics;  Laterality: Right;    Social History   Socioeconomic History  . Marital status: Married    Spouse name: Not on file  . Number of children: Not on file  . Years of education: Not on file  . Highest education level: Not on file  Occupational History  . Not on file  Social Needs  . Financial resource strain: Not on file  . Food insecurity    Worry: Not on file    Inability: Not on file  . Transportation needs    Medical: Not on file    Non-medical: Not on file  Tobacco Use  . Smoking status: Never Smoker  . Smokeless tobacco: Never Used  Substance and Sexual Activity  . Alcohol use: Yes    Comment: glass of wine every 3-4 months  . Drug use: No  . Sexual activity: Never  Lifestyle  . Physical activity    Days per week: Not on file    Minutes per session: Not on file  . Stress: Not on file  Relationships  . Social Herbalist on phone: Not on file    Gets together: Not on file    Attends religious service: Not on file    Active member of club or organization: Not on file    Attends meetings of clubs or organizations: Not on file    Relationship status: Not on file  . Intimate partner violence    Fear of current or ex partner: Not on file    Emotionally abused: Not on file    Physically abused: Not on file    Forced sexual activity: Not on file  Other Topics Concern  . 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    1 son, 1 daughter, both out of state    Family History  Problem Relation Age of Onset  . Hypertension Mother   . Kidney disease Mother   . Hypertension Father   . Diabetes Mellitus II Father   . Colon polyps Father   . Diabetes Mellitus II  Brother   . Diabetes Mellitus II Brother   . Cancer - Prostate Brother   . Colon cancer Neg Hx     Allergies  Allergen Reactions  . Morphine And Related Nausea And Vomiting    Causes nausea & vomiting  . Codeine Sulfate Other (See Comments)    intolerant  . Metformin And Related Other (See Comments)    Gi intolerance, not an allergy    Medication list reviewed and updated in full in [place].  ROS: GEN: Acute illness details above GI: Tolerating PO intake GU: maintaining adequate hydration and urination Pulm: No SOB Interactive and getting along well at home.  Otherwise, ROS is as per the HPI.  Objective:   BP (!) 158/84   Pulse 85   Temp 98.7 F (37.1 C) (Temporal)   Ht 6' (1.829 m)   Wt 282 lb 8 oz (128.1 kg)   SpO2 95%   BMI 38.31 kg/m    GEN: WDWN, NAD, Non-toxic, Alert & Oriented x 3 HEENT: Atraumatic, Normocephalic.  Ears and Nose: No external deformity. EXTR: No clubbing/cyanosis/edema NEURO: Normal gait.  PSYCH: Normally interactive. Conversant. Not depressed or anxious appearing.  Calm demeanor.   Skin: Externally just lateral to the gluteal cleft on the left there is an area of induration without fluctuance.  There does seem to be an area is centrally where there would have been some pus in a fluctuant area, but is not there now.  He does have significant surrounding redness and tenderness with palpation.   Laboratory and Imaging Data:  Assessment and Plan:     ICD-10-CM   1. Cellulitis and abscess of buttock  L02.31    L03.317    The abscess seems to have self evacuated, and I cannot appreciate any  fluctuance, but there is some surrounding worsening cellulitis.  I will place him on doxycycline to also cover MRSA.  If not improving or worsening, he would need to have follow-up.  Follow-up: No follow-ups on file.  Meds ordered this encounter  Medications  . doxycycline (VIBRA-TABS) 100 MG tablet    Sig: Take 1 tablet (100 mg total) by mouth 2 (two) times daily for 10 days.    Dispense:  20 tablet    Refill:  0   No orders of the defined types were placed in this encounter.   Signed,  [name]. Maricruz Lucero, MD   Outpatient Encounter Medications as of 02/22/2019  Medication Sig  . allopurinol (ZYLOPRIM) 300 MG tablet TAKE 1/2 TABLET BY MOUTH EVERY MORNING  . amLODipine (NORVASC) 10 MG tablet Take 1 tablet (10 mg total) by mouth daily.  [name] aspirin 81 MG tablet Take 81 mg by mouth daily.  [name] atorvastatin (LIPITOR) 10 MG tablet TAKE 1 TABLET BY MOUTH AT BEDTIME  . Blood Glucose Monitoring Suppl (ONE TOUCH ULTRA 2) w/Device KIT 1 each daily by Does not apply route.  [name] CINNAMON PO Take 200 mg by mouth daily.   . Continuous Blood Gluc Receiver (FREESTYLE LIBRE 14 DAY READER) DEVI 1 Device by Other route daily. Use daily as needed to check sugar.  Dx E11.9 with hyperglycemia requiring insulin/mult sugar checks  . Continuous Blood Gluc Sensor (FREESTYLE LIBRE 14 DAY SENSOR) MISC 1 Device by Does not apply route every 14 (fourteen) days. Use daily PRN to check sugar. Dx E11.9 w/hyperglycemia requiring insulin  . diclofenac sodium (VOLTAREN) 1 % GEL   . dorzolamide-timolol (COSOPT) 22.3-6.8 MG/ML ophthalmic solution Place 1 drop into the  left eye 2 (two) times daily.   . Dulaglutide (TRULICITY) 1.68 HF/2.9MS SOPN Inject 0.75 mg into the skin once a week.  [name] gemfibrozil (LOPID) 600 MG tablet TAKE 1 TABLET BY MOUTH TWICE DAILY  . glucose blood (ONE TOUCH ULTRA TEST) test strip Check blood sugar before and after each meal and as directed. Dx E11.9  . Insulin Glargine (LANTUS SOLOSTAR) 100 UNIT/ML  Solostar Pen Inject 220 Units into the skin every morning.  . Insulin Pen Needle (B-D UF III MINI PEN NEEDLES) 31G X 5 MM MISC 1 each by Does not apply route daily. Use to administer insulin daily  . latanoprost (XALATAN) 0.005 % ophthalmic solution Place 1 drop into both eyes at bedtime.  [name] losartan (COZAAR) 100 MG tablet TAKE 1 TABLET BY MOUTH DAILY  . meloxicam (MOBIC) 15 MG tablet as needed.   . metoprolol succinate (TOPROL-XL) 25 MG 24 hr tablet Take 1 tablet (25 mg total) by mouth daily.  . Multiple Vitamins-Minerals (VITRUM 50+ SENIOR MULTI PO) Take 1 tablet by mouth daily.   [name] ofloxacin (OCUFLOX) 0.3 % ophthalmic solution   . oxybutynin (DITROPAN-XL) 10 MG 24 hr tablet Take 10 mg by mouth daily.  [name] doxycycline (VIBRA-TABS) 100 MG tablet Take 1 tablet (100 mg total) by mouth 2 (two) times daily for 10 days.   No facility-administered encounter medications on file as of 02/22/2019.

## 2019-03-10 ENCOUNTER — Other Ambulatory Visit: Payer: Self-pay | Admitting: *Deleted

## 2019-03-13 DIAGNOSIS — L570 Actinic keratosis: Secondary | ICD-10-CM | POA: Diagnosis not present

## 2019-03-13 DIAGNOSIS — L814 Other melanin hyperpigmentation: Secondary | ICD-10-CM | POA: Diagnosis not present

## 2019-03-13 DIAGNOSIS — D485 Neoplasm of uncertain behavior of skin: Secondary | ICD-10-CM | POA: Diagnosis not present

## 2019-03-13 DIAGNOSIS — L821 Other seborrheic keratosis: Secondary | ICD-10-CM | POA: Diagnosis not present

## 2019-03-13 DIAGNOSIS — L430 Hypertrophic lichen planus: Secondary | ICD-10-CM | POA: Diagnosis not present

## 2019-03-13 DIAGNOSIS — Z85828 Personal history of other malignant neoplasm of skin: Secondary | ICD-10-CM | POA: Diagnosis not present

## 2019-03-27 ENCOUNTER — Other Ambulatory Visit: Payer: Self-pay

## 2019-03-27 ENCOUNTER — Encounter: Payer: Self-pay | Admitting: Family Medicine

## 2019-03-27 ENCOUNTER — Ambulatory Visit (INDEPENDENT_AMBULATORY_CARE_PROVIDER_SITE_OTHER): Payer: HMO | Admitting: Family Medicine

## 2019-03-27 DIAGNOSIS — R238 Other skin changes: Secondary | ICD-10-CM

## 2019-03-27 DIAGNOSIS — M1990 Unspecified osteoarthritis, unspecified site: Secondary | ICD-10-CM | POA: Diagnosis not present

## 2019-03-27 MED ORDER — TRIAMCINOLONE ACETONIDE 0.1 % EX CREA: 1.0000 "application " | TOPICAL_CREAM | Freq: Two times a day (BID) | CUTANEOUS | 0 refills | Status: DC | PRN

## 2019-03-27 MED ORDER — MELOXICAM 15 MG PO TABS: 7.5000 mg | ORAL_TABLET | Freq: Every day | ORAL | 2 refills | Status: DC | PRN

## 2019-03-27 NOTE — Patient Instructions (Signed)
Try using a pillow when in the recliner.  Use triamcinolone cream if needed.   Take meloxicam daily with food.  See if that helps.  Take care.  Glad to see you.

## 2019-03-27 NOTE — Progress Notes (Signed)
This visit occurred during the SARS-CoV-2 public health emergency.  Safety protocols were in place, including screening questions prior to the visit, additional usage of staff PPE, and extensive cleaning of exam room while observing appropriate contact time as indicated for disinfecting solutions.  He improved on prev abx but returned in the meantime. It may have drained some prev.  Locally sore.  No fevers.  No vomiting, no diarrhea.  He is not as bad as he was when he initially presented, but he wanted to get the area rechecked.  He is still having R hip pain.  He has seen Dr. Mayer Camel about it.  He is taking meloxicam.  He had trouble with opiates with NAV.  He can tolerate his situation as is, with pain on prolonged standing.  He is taking meloxicam as needed.  We talked about trying 7.5mg  daily for now, since he was not taking it daily.  Meds, vitals, and allergies reviewed.   ROS: Per HPI unless specifically indicated in ROS section   nad ncat Pain on standing with limp due to right hip pain. He has a 1 cm area that appears slightly irritated but not infected without any fluctuant mass or abscess located to the left of the gluteal crease.

## 2019-03-29 DIAGNOSIS — R238 Other skin changes: Secondary | ICD-10-CM | POA: Insufficient documentation

## 2019-03-29 NOTE — Assessment & Plan Note (Signed)
He can try meloxicam 7.5 mg daily for now and follow-up with orthopedics if needed.  He will update me as needed.

## 2019-03-29 NOTE — Assessment & Plan Note (Signed)
This looks like he has some irritation but not infection.  No need for incision and drainage.  Discussed offloading by using a pillow and rotating/relieving pressure when he is sitting.  He can try using triamcinolone cream on the area for the local irritation.  He will update me as needed.

## 2019-04-04 ENCOUNTER — Ambulatory Visit: Payer: Self-pay

## 2019-04-10 ENCOUNTER — Other Ambulatory Visit: Payer: Self-pay | Admitting: Family Medicine

## 2019-04-13 ENCOUNTER — Encounter: Payer: Self-pay | Admitting: Family Medicine

## 2019-04-13 ENCOUNTER — Ambulatory Visit (INDEPENDENT_AMBULATORY_CARE_PROVIDER_SITE_OTHER): Payer: HMO | Admitting: Family Medicine

## 2019-04-13 ENCOUNTER — Other Ambulatory Visit: Payer: Self-pay

## 2019-04-13 VITALS — BP 150/80 | HR 86 | Temp 96.7°F | Ht 72.0 in | Wt 273.0 lb

## 2019-04-13 DIAGNOSIS — E669 Obesity, unspecified: Secondary | ICD-10-CM

## 2019-04-13 DIAGNOSIS — L89309 Pressure ulcer of unspecified buttock, unspecified stage: Secondary | ICD-10-CM

## 2019-04-13 DIAGNOSIS — E1169 Type 2 diabetes mellitus with other specified complication: Secondary | ICD-10-CM | POA: Diagnosis not present

## 2019-04-13 LAB — POCT GLYCOSYLATED HEMOGLOBIN (HGB A1C): Hemoglobin A1C: 10.3 % — AB (ref 4.0–5.6)

## 2019-04-13 MED ORDER — LANTUS SOLOSTAR 100 UNIT/ML ~~LOC~~ SOPN: 200.0000 [IU] | PEN_INJECTOR | Freq: Every day | SUBCUTANEOUS | 12 refills | Status: DC

## 2019-04-13 NOTE — Patient Instructions (Addendum)
Inject room temperature insulin.  Keep other insulin in the fridge.  If needed, inject small amounts more often (50 units per injection) Use barrier cream/zine oxide cream on your bottom and avoid prolonged sitting.  See how that does and let me know about your sugars next week, since your A1c was up.   Take care.  Glad to see you.   Please schedule a yearly visit in about 3 months, labs ahead of time if possible.   Let me check on options to get your insulin refilled.

## 2019-04-13 NOTE — Progress Notes (Signed)
This visit occurred during the SARS-CoV-2 public health emergency.  Safety protocols were in place, including screening questions prior to the visit, additional usage of staff PPE, and extensive cleaning of exam room while observing appropriate contact time as indicated for disinfecting solutions.  Diabetes:  Using medications without difficulties: see below  Hypoglycemic episodes: no Hyperglycemic episodes: no Feet problems: he has a small callous with foot clinic f/u pending.   Blood Sugars averaging: ~200.   eye exam within last year:  yes He is having pain with insulin injection.  He was giving 200 units per day prev and he was not leaving it at room temperature.    He is scheduled to get his COVID vaccine.    Meds, vitals, and allergies reviewed.   ROS: Per HPI unless specifically indicated in ROS section   GEN: nad, alert and oriented HEENT: ncat NECK: supple w/o LA CV: rrr. PULM: ctab, no inc wob ABD: soft, +bs EXT: no edema SKIN: no acute rash but small ~1cm superficial pressure sore on the L buttock w/o sign of infection or fluctuance.  No spreading erythema.   Diabetic foot exam: Normal inspection No skin breakdown Ccalluses on the B feet, no ulcerated.   Normal DP pulses Normal sensation to light touch and monofilament Nails thickened, L 2nd nail absent.

## 2019-04-17 DIAGNOSIS — L89309 Pressure ulcer of unspecified buttock, unspecified stage: Secondary | ICD-10-CM | POA: Insufficient documentation

## 2019-04-17 NOTE — Assessment & Plan Note (Signed)
Superficial, should heal over.  Doesn't appear infected.  Can use barrier cream/zine oxide cream locally and avoid prolonged sitting.  D/w pt to rotate pressure/offload with a pillow.  He agrees.

## 2019-04-17 NOTE — Assessment & Plan Note (Signed)
Inject room temperature insulin.  Keep other insulin in the fridge.  If needed, inject small amounts more often (50 units per injection) He'll see how that does and he'll let me know about his sugars next week, since his A1c was up.    Yearly visit in about 3 months, labs ahead of time if possible.  I'm checking on options with med assistance here at the clinic.

## 2019-04-18 ENCOUNTER — Ambulatory Visit: Payer: Self-pay

## 2019-04-20 ENCOUNTER — Ambulatory Visit: Payer: Self-pay

## 2019-04-21 ENCOUNTER — Other Ambulatory Visit: Payer: Self-pay | Admitting: Pharmacist

## 2019-04-21 ENCOUNTER — Other Ambulatory Visit: Payer: Self-pay | Admitting: Pharmacy Technician

## 2019-04-21 NOTE — Patient Outreach (Signed)
[facility] [facility]) Care Management  04/21/2019  [name] May 27, 1940 [id number]   Return call placed to Mr. Mcculloh, HIPAA identifiers verified. Mr. Lowe is asking for assistance with re-enrolling for patient assistance. Informed Mr. Debevec that we can start the process for his Trulicity however, [employer] requires him to spend 2% Out of [place] for prescriptions before we can apply for his Lantus Solostar. He confirms that he would need Dr. Damita Dunnings to send a script for his Lantus Solostar to his local pharmacy.  Will route note to [place] for a medication review.  [name] [place] Certified Pharmacy Technician [place] Management Direct Dial:[phone]

## 2019-04-21 NOTE — Patient Outreach (Addendum)
[facility]) Care Management  [place]   04/21/2019  [name] [birth year] [id number]  Communication received from [place] technician that patient is requesting assistance with renewing patient assistance program applications for Trulicity and Lantus for 2021.    Outreach:  Successful telephone call with [name].  HIPAA identifiers verified.   Subjective:  Patient reports his A1c has gone up 8.4 --> 10.3 due to eating out more during the pandemic.  He reports he recently started using FreeStyle Libre CGM and is very happy with it.  His dose of Lantus is 200 units which has started hurting his stomach.  He reports PCP recommended doing 4 injections of 32m each to ensure adequate absorption and reduce pain.  Patient agreeable to medication review.   Objective: The 10-year ASCVD risk score (Mikey BussingDC Jr., et al., 2013) is: 67%   Values used to calculate the score:     Age: [age]years     Sex: Male     Is Non-Hispanic African American: No     Diabetic: Yes     Tobacco smoker: No     Systolic Blood Pressure: 1675mmHg     Is BP treated: Yes     HDL Cholesterol: 33.1 mg/dL     Total Cholesterol: 156 mg/dL  Lab Results  Component Value Date   CREATININE 1.25 10/13/2018   CREATININE 1.33 07/15/2018   CREATININE 1.48 10/12/2017    Lab Results  Component Value Date   HGBA1C 10.3 (A) 04/13/2019    Lipid Panel     Component Value Date/Time   CHOL 156 07/15/2018 0935   CHOL 211 12/21/2014 0000   TRIG 391.0 (H) 07/15/2018 0935   TRIG 413 12/21/2014 0000   HDL 33.10 (L) 07/15/2018 0935   CHOLHDL 5 07/15/2018 0935   VLDL 78.2 (H) 07/15/2018 0935   LDLCALC 82 05/09/2014 0000   LDLDIRECT 52.0 07/15/2018 0935    BP Readings from Last 3 Encounters:  04/13/19 (!) 150/80  03/27/19 (!) 164/94  02/22/19 (!) 158/84    Allergies  Allergen Reactions  . Morphine And Related Nausea And Vomiting    Causes nausea & vomiting  . Codeine Sulfate Other  (See Comments)    intolerant  . Metformin And Related Other (See Comments)    Gi intolerance, not an allergy    Medications Reviewed Today    Reviewed by DTonia Ghent MD (Physician) on 04/13/19 at [phone] Med List Status: <None>  Medication Order Taking? Sig Documenting Provider Last Dose Status Informant  AFLURIA QUADRIVALENT injection [id number]  [provider] Taking Active   allopurinol (ZYLOPRIM) 300 MG tablet [id number] TAKE 1/2 TABLET BY MOUTH EVERY MORNING DTonia Ghent MD Taking Active   amLODipine (NORVASC) 10 MG tablet [id number] TAKE 1 TABLET BY MOUTH DAILY DTonia Ghent MD Taking Active   aspirin 81 MG tablet [id number] Take 81 mg by mouth daily. [provider] Taking Active Self  atorvastatin (LIPITOR) 10 MG tablet [id number] TAKE 1 TABLET BY MOUTH AT BEDTIME DTonia Ghent MD Taking Active   Blood Glucose Monitoring Suppl (ONE TOUCH ULTRA 2) w/Device KIT [id number] 1 each daily by Does not apply route. ERenato Shin MD Taking Active   CINNAMON PO [id number] Take 200 mg by mouth daily.  [provider] Taking Active   Continuous Blood Gluc Receiver (FREESTYLE LIBRE 14 DAY READER) [place] [id number] 1 Device by Other route daily.  Use daily as needed to check sugar.  Dx E11.9 with hyperglycemia requiring insulin/mult sugar checks Tonia Ghent, MD Taking Active   Continuous Blood Gluc Sensor (FREESTYLE LIBRE 14 DAY SENSOR) [place] [id number] Yes 1 Device by Does not apply route every 14 (fourteen) days. Use daily PRN to check sugar. Dx E11.9 w/hyperglycemia requiring insulin Tonia Ghent, MD Taking Active   diclofenac sodium (VOLTAREN) 1 % GEL [id number] Yes  [provider] Taking Active   dorzolamide-timolol (COSOPT) 22.3-6.8 MG/ML ophthalmic solution [id number] Yes Place 1 drop into the left eye 2 (two) times daily.  [provider] Taking Active   Dulaglutide (TRULICITY) 6.96 VE/9.3YB SOPN  [id number] Yes Inject 0.75 mg into the skin once a week. Tonia Ghent, MD Taking Active   gemfibrozil (LOPID) 600 MG tablet [id number] Yes TAKE 1 TABLET BY MOUTH TWICE DAILY Tonia Ghent, MD Taking Active   glucose blood (ONE TOUCH ULTRA TEST) test strip [id number] Yes Check blood sugar before and after each meal and as directed. Dx E11.9 Tonia Ghent, MD Taking Active   Insulin Pen Needle (B-D UF III MINI PEN NEEDLES) 31G X 5 MM MISC [id number] Yes 1 each by Does not apply route daily. Use to administer insulin daily Renato Shin, MD Taking Active   latanoprost (XALATAN) 0.005 % ophthalmic solution [id number] Yes Place 1 drop into both eyes at bedtime. [provider] Taking Active Self  losartan (COZAAR) 100 MG tablet [id number] Yes TAKE 1 TABLET BY MOUTH DAILY Tonia Ghent, MD Taking Active   meloxicam [facility]) 15 MG tablet [id number] Yes Take 0.5-1 tablets (7.5-15 mg total) by mouth daily as needed for pain (with food). Tonia Ghent, MD Taking Active   metoprolol succinate (TOPROL-XL) 25 MG 24 hr tablet [id number] Yes Take 1 tablet (25 mg total) by mouth daily. Tonia Ghent, MD Taking Active   Multiple Vitamins-Minerals (VITRUM 50+ SENIOR MULTI PO) [id number] Yes Take 1 tablet by mouth daily.  [provider] Taking Active Self  ofloxacin (OCUFLOX) 0.3 % ophthalmic solution [id number] Yes  [provider] Taking Active   oxybutynin (DITROPAN-XL) 10 MG 24 hr tablet [id number] Yes Take 10 mg by mouth daily. [provider] Taking Active   triamcinolone cream (KENALOG) 0.1 % [id number] Yes Apply 1 application topically 2 (two) times daily as needed. Tonia Ghent, MD Taking Active           Assessment: Drugs sorted by system:  Hematologic: aspirin '81mg'$   Cardiovascular: amlodipine, atorvastatin, losartan, metoprolol succinate, gemfibrozil  Endocrine: dulaglutide, insulin glargine  Topical: diclofenac gel, triamcinolone cream, cosopt eye  drops, latanoprost eye drops  Pain:meloxicam  Genitourinary:oxybutynin  Vitamins/Minerals/Supplements: cinnamon, MVI  Miscellaneous: allopurinol  Medication Review Findings:  . Statin / Lopid - monitor closely for signs / symptoms of rhabdomyolysis with concomitant use, patient has been taking together chronically . Meloxicam - use caution in elderly due to possible GIB / renal affects.  Patient only taking PRN currently . Diabetes regimen:  o Patient with high insulin requirements, may do better with more concentrated insulin to lower volume of insulin injection.  Toujeo (300 units/mL) is on [place]-[employer] Tier 6 formulary with the senior savings model.  It will be $0 copay through entire year including if patient in coverage gap.  If patient does go into coverage gap due to high insurance cost of Toujeo, other medications are all Tier 1 and will not be expensive.  o Trulicity: Patient currently on lowest dose of  Trulicity 8.68YB / week.  May consider titrating up dose to increase effect on CBG if patient able to tolerate.      Medication Assistance Findings:  Medication assistance needs identified: Insulin and Trulicity  Extra Help:  Not eligible for Extra Help Low Income Subsidy based on reported income and assets  Patient Assistance Programs: Trulicity made by [place] requirement met: Yes o Out-of-pocket prescription expenditure met:   Not Applicable - Patient has met application requirements to apply for this program.  - Reviewed program requirements with patient.     Plan: . I will route patient assistance letter to [place] technician who will coordinate patient assistance program application process for medications listed above.  [facility] pharmacy technician will assist with obtaining all required documents from both patient and provider(s) and submit application(s) once completed.   . Will route not to PCP regarding diabetes medication considerations  Ralene Bathe,  PharmD, [place] [phone]

## 2019-04-22 ENCOUNTER — Ambulatory Visit: Payer: HMO | Attending: Internal Medicine

## 2019-04-22 DIAGNOSIS — Z23 Encounter for immunization: Secondary | ICD-10-CM

## 2019-04-22 NOTE — Progress Notes (Signed)
   Covid-19 Vaccination Clinic  Name:  [name]    MRN: [id number] DOB: 03/17/1941  04/22/2019  [name] was observed post Covid-19 immunization for 15 minutes without incidence. He was provided with Vaccine Information Sheet and instruction to access the V-Safe system.   [name] was instructed to call 911 with any severe reactions post vaccine: [name] Difficulty breathing  . Swelling of your face and throat  . A fast heartbeat  . A bad rash all over your body  . Dizziness and weakness    Immunizations Administered    Name Date Dose VIS Date Route   Pfizer COVID-19 Vaccine 04/22/2019 12:02 PM 0.3 mL 03/10/2019 Intramuscular   Manufacturer: [place]   Lot: [id number]   [place]: [id number]

## 2019-04-24 ENCOUNTER — Other Ambulatory Visit: Payer: Self-pay | Admitting: Family Medicine

## 2019-04-25 ENCOUNTER — Ambulatory Visit: Payer: Self-pay | Admitting: Pharmacist

## 2019-04-25 ENCOUNTER — Other Ambulatory Visit: Payer: Self-pay | Admitting: Pharmacist

## 2019-04-25 NOTE — Patient Outreach (Signed)
[facility]) Care Management  [place] 04/25/2019  [name] [birth year] [id number]  Communication received from Dr. Damita Dunnings who is agreeable to transition patient from Lantus to [facility] due to high insulin requirements.  New RX will be sent to patient's pharmacy.  MD will also complete application for renewal for Trulicity through [employer] PAP and will titrate up dose.   Successful call to patient to provide update.  He reports he just refilled Lantus but once [employer] sent to pharmacy, he will switch over if ok to fill via insurance.  PAP application has not yet arrived.  Patient will complete and return it to [facility] once it arrives.   Ralene Bathe, PharmD, [place]  [phone]

## 2019-05-02 DIAGNOSIS — H04123 Dry eye syndrome of bilateral lacrimal glands: Secondary | ICD-10-CM | POA: Diagnosis not present

## 2019-05-02 DIAGNOSIS — E119 Type 2 diabetes mellitus without complications: Secondary | ICD-10-CM | POA: Diagnosis not present

## 2019-05-02 DIAGNOSIS — Z961 Presence of intraocular lens: Secondary | ICD-10-CM | POA: Diagnosis not present

## 2019-05-02 DIAGNOSIS — H401111 Primary open-angle glaucoma, right eye, mild stage: Secondary | ICD-10-CM | POA: Diagnosis not present

## 2019-05-02 DIAGNOSIS — H2511 Age-related nuclear cataract, right eye: Secondary | ICD-10-CM | POA: Diagnosis not present

## 2019-05-02 DIAGNOSIS — H401123 Primary open-angle glaucoma, left eye, severe stage: Secondary | ICD-10-CM | POA: Diagnosis not present

## 2019-05-02 DIAGNOSIS — H43813 Vitreous degeneration, bilateral: Secondary | ICD-10-CM | POA: Diagnosis not present

## 2019-05-03 ENCOUNTER — Other Ambulatory Visit: Payer: Self-pay | Admitting: Pharmacy Technician

## 2019-05-03 NOTE — Patient Outreach (Signed)
[place] [facility]) Care Management  05/03/2019  [name] 19-Jun-1940 [id number]   Received patient portion(s) of patient assistance application(s) for Trulicity.   Re-faxed provider portion to Dr. Damita Dunnings for completion  Will submit to [facility] once all documents have been obtained  [name]. [name] [place] Certified Pharmacy Technician [place] Management Direct [phone]

## 2019-05-04 ENCOUNTER — Other Ambulatory Visit: Payer: Self-pay | Admitting: Family Medicine

## 2019-05-04 MED ORDER — TRULICITY 1.5 MG/0.5ML ~~LOC~~ SOAJ: 1.5000 mg | SUBCUTANEOUS | 12 refills | Status: DC

## 2019-05-04 MED ORDER — TOUJEO MAX SOLOSTAR 300 UNIT/ML ~~LOC~~ SOPN: 200.0000 [IU] | PEN_INJECTOR | Freq: Every day | SUBCUTANEOUS | 12 refills | Status: DC

## 2019-05-04 NOTE — Progress Notes (Signed)
Please update patient.  I sent prescription for Toujeo insulin to his pharmacy.  It is concentrated so he can still give 200 units but it will be less volume total per day.  This should be easier for him to administer.  If he has trouble getting that filled then let me know.  He does not have to taper off 1 insulin to start the other.  It is a one-to-one substitution  I filled out his Trulicity application.  The plan is to increase that to 1.5 mg weekly.  I did my portion of the form and we will need him to fill out his portion.  When it is completed I need the entire file (along with a printed med list) to go back to [facility].    Thanks.

## 2019-05-04 NOTE — Progress Notes (Signed)
Left detailed message on voicemail with form at my desk for patient to complete his part and return to [place].  Mike Craze, CMA  05/04/2019

## 2019-05-08 ENCOUNTER — Other Ambulatory Visit: Payer: Self-pay | Admitting: Pharmacist

## 2019-05-08 NOTE — Patient Outreach (Signed)
[facility]) Care Management  [place] 05/08/2019  [name] Sep 10, 1940 [id number]  Incoming call from patient. He received new insulin, Toujeo Max Solostar, and has questions regarding use as compared to Lantus.  We reviewed dosage (200 units/day), lower volume requirements, and how often he will need to switch pens (every 4.5 days).  We also reviewed insulin injection technique.  Patient voiced understanding.    Plan: [place] will continue to follow up on Trulicity patient assistance program application.    Ralene Bathe, PharmD, [place] [phone]

## 2019-05-09 ENCOUNTER — Other Ambulatory Visit: Payer: Self-pay

## 2019-05-09 ENCOUNTER — Ambulatory Visit (INDEPENDENT_AMBULATORY_CARE_PROVIDER_SITE_OTHER): Payer: HMO | Admitting: Family Medicine

## 2019-05-09 ENCOUNTER — Encounter: Payer: Self-pay | Admitting: Family Medicine

## 2019-05-09 VITALS — BP 162/84 | HR 103 | Temp 97.3°F | Ht 72.0 in | Wt 274.2 lb

## 2019-05-09 DIAGNOSIS — I10 Essential (primary) hypertension: Secondary | ICD-10-CM

## 2019-05-09 DIAGNOSIS — L89309 Pressure ulcer of unspecified buttock, unspecified stage: Secondary | ICD-10-CM | POA: Diagnosis not present

## 2019-05-09 DIAGNOSIS — E1169 Type 2 diabetes mellitus with other specified complication: Secondary | ICD-10-CM | POA: Diagnosis not present

## 2019-05-09 DIAGNOSIS — E669 Obesity, unspecified: Secondary | ICD-10-CM | POA: Diagnosis not present

## 2019-05-09 MED ORDER — METOPROLOL SUCCINATE ER 25 MG PO TB24: 50.0000 mg | ORAL_TABLET | Freq: Every day | ORAL | Status: DC

## 2019-05-09 NOTE — Patient Instructions (Addendum)
Off load with a pillow and use the hydrocolloid dressing for now.  Then use barrier cream when better.   Increase metoprolol to 50mg  a day.  Update me as needed.   I'll work on the [employer].  Take care.  Glad to see you.

## 2019-05-09 NOTE — Progress Notes (Signed)
This visit occurred during the SARS-CoV-2 public health emergency.  Safety protocols were in place, including screening questions prior to the visit, additional usage of staff PPE, and extensive cleaning of exam room while observing appropriate contact time as indicated for disinfecting solutions.  BP elevation noted.  D/w pt about inc metoprolol to 50mg  and he'll update me as needed.    Skin lesion still painful.  Unclear if draining.  No fevers.  Waxing and waning pain.  Can have pain sitting (some days) but not walking.  Some days with more discomfort than others.  He sits a lot during the day.  D/w pt about getting his trulicity paperwork submitted.  He has changed to toujeo and his sugar has been ~200 recently.  Inc in trulicity should help with that, d/w pt.   Meds, vitals, and allergies reviewed.   ROS: Per HPI unless specifically indicated in ROS section   GEN: nad, alert and oriented HEENT: ncat NECK: supple w/o LA CV: rrr.  PULM: ctab, no inc wob ABD: soft, +bs EXT: no edema SKIN: no acute rash but ~1 shallow superficial ulcer on the L buttock w/o discharge or spreading erythema. No fluctuance.

## 2019-05-10 ENCOUNTER — Other Ambulatory Visit: Payer: Self-pay

## 2019-05-10 NOTE — Patient Outreach (Signed)
  [place] [facility]) Care Management Chronic Special Needs Program  05/10/2019  Name: DAVEN MICHALKO DOB: 1941-02-18  MRN: [id number]  Mr. Vicki Bessey is enrolled in a chronic special needs plan for Diabetes. Chronic Care Management Coordinator telephoned client to complete health risk assessment and to update individualized care plan. Client reports he just went over the questions recently with someone. However, RNCM does not have access to this yet.   Plan: RNCM will follow up within the month to give time for Health risk assessment to be processed and sent to [facility].    Thea Silversmith, RN, MSN, [place] [phone]

## 2019-05-11 ENCOUNTER — Other Ambulatory Visit: Payer: Self-pay | Admitting: Pharmacy Technician

## 2019-05-11 NOTE — Patient Outreach (Signed)
[facility]) Care Management  05/11/2019  JAXSEN NAFTZGER 12/03/1940 [id number]   Have not received provider portion of [employer] application for Trulicity.  Faxed patient portion of application and required documents into [employer] in case provider's office sent application directly to company.  [name] [place] Certified Pharmacy Technician [place] Management Direct Dial:[phone]

## 2019-05-11 NOTE — Assessment & Plan Note (Signed)
Discussed off loading with standing, rotating a pillow.  Covered with hydrocolloid dressing in the meantime.  Doesn't appear infected.  With improvement with offloading, then he can transition to barrier cream.  The main issue is to relieve pressure on the area, d/w pt.  He agrees.

## 2019-05-11 NOTE — Assessment & Plan Note (Signed)
D/w pt about getting his trulicity paperwork submitted.  He has changed to toujeo and his sugar has been ~200 recently.  Inc in trulicity should help with that, d/w pt.

## 2019-05-11 NOTE — Assessment & Plan Note (Signed)
D/w pt about inc metoprolol to 50mg  and he'll update me as needed.

## 2019-05-13 ENCOUNTER — Ambulatory Visit: Payer: HMO | Attending: Internal Medicine

## 2019-05-13 DIAGNOSIS — Z23 Encounter for immunization: Secondary | ICD-10-CM | POA: Insufficient documentation

## 2019-05-13 NOTE — Progress Notes (Signed)
   Covid-19 Vaccination Clinic  Name:  [name]    MRN: [id number] DOB: 05/10/1940  05/13/2019  [name] was observed post Covid-19 immunization for 15 minutes without incidence. He was provided with Vaccine Information Sheet and instruction to access the V-Safe system.   [name] was instructed to call 911 with any severe reactions post vaccine: [name] Difficulty breathing  . Swelling of your face and throat  . A fast heartbeat  . A bad rash all over your body  . Dizziness and weakness    Immunizations Administered    Name Date Dose VIS Date Route   Pfizer COVID-19 Vaccine 05/13/2019 10:42 AM 0.3 mL 03/10/2019 Intramuscular   Manufacturer: [place]   Lot: [id number]   [place]: [id number]

## 2019-05-17 ENCOUNTER — Other Ambulatory Visit: Payer: Self-pay | Admitting: Pharmacy Technician

## 2019-05-17 NOTE — Patient Outreach (Signed)
[facility]) Care Management  05/17/2019  [name] 02/25/1941 [id number]   Received provider portion(s) of patient assistance application(s) for Trulicity. Faxed provider portion of application to [employer], had previously submitted patient portion.  Will follow up with company(ies) in 5-7 business days to check status of application(s).  [name] [place] Certified Pharmacy Technician [place] Management Direct [phone]

## 2019-05-23 ENCOUNTER — Telehealth: Payer: Self-pay

## 2019-05-23 DIAGNOSIS — L89309 Pressure ulcer of unspecified buttock, unspecified stage: Secondary | ICD-10-CM

## 2019-05-23 NOTE — Telephone Encounter (Signed)
[name] left v/m [name] was seen on 05/09/19 for open wound between hips and pts wife said the wound is no better; also concerned about skin around the wound not any better either. [name] has previously been seen x 3 and taken abx,used cream and now patches; [name] wife is also applying triple abx ointment. [name] wants to know next step and request cb after Dr Josefine Class review.

## 2019-05-23 NOTE — Telephone Encounter (Signed)
Wife advised. 

## 2019-05-23 NOTE — Telephone Encounter (Signed)
I put in the referral to the wound clinic.  If he isn't getting the pressure off the area, then I don't think this will heal.  Please verify that he is getting off the area.  Thanks.

## 2019-05-24 ENCOUNTER — Ambulatory Visit: Payer: Self-pay

## 2019-05-30 ENCOUNTER — Encounter: Payer: HMO | Attending: Physician Assistant | Admitting: Physician Assistant

## 2019-05-30 ENCOUNTER — Other Ambulatory Visit: Payer: Self-pay

## 2019-05-30 DIAGNOSIS — E669 Obesity, unspecified: Secondary | ICD-10-CM | POA: Insufficient documentation

## 2019-05-30 DIAGNOSIS — Z8249 Family history of ischemic heart disease and other diseases of the circulatory system: Secondary | ICD-10-CM | POA: Insufficient documentation

## 2019-05-30 DIAGNOSIS — R531 Weakness: Secondary | ICD-10-CM | POA: Diagnosis not present

## 2019-05-30 DIAGNOSIS — L89323 Pressure ulcer of left buttock, stage 3: Secondary | ICD-10-CM | POA: Diagnosis not present

## 2019-05-30 DIAGNOSIS — E1151 Type 2 diabetes mellitus with diabetic peripheral angiopathy without gangrene: Secondary | ICD-10-CM | POA: Insufficient documentation

## 2019-05-30 DIAGNOSIS — Z6837 Body mass index (BMI) 37.0-37.9, adult: Secondary | ICD-10-CM | POA: Diagnosis not present

## 2019-05-30 DIAGNOSIS — M1611 Unilateral primary osteoarthritis, right hip: Secondary | ICD-10-CM | POA: Diagnosis not present

## 2019-05-30 DIAGNOSIS — Z809 Family history of malignant neoplasm, unspecified: Secondary | ICD-10-CM | POA: Insufficient documentation

## 2019-05-30 DIAGNOSIS — Z885 Allergy status to narcotic agent status: Secondary | ICD-10-CM | POA: Insufficient documentation

## 2019-05-30 DIAGNOSIS — E11622 Type 2 diabetes mellitus with other skin ulcer: Secondary | ICD-10-CM | POA: Insufficient documentation

## 2019-05-30 DIAGNOSIS — G8929 Other chronic pain: Secondary | ICD-10-CM | POA: Diagnosis not present

## 2019-05-30 DIAGNOSIS — I10 Essential (primary) hypertension: Secondary | ICD-10-CM | POA: Insufficient documentation

## 2019-05-30 DIAGNOSIS — E1139 Type 2 diabetes mellitus with other diabetic ophthalmic complication: Secondary | ICD-10-CM | POA: Insufficient documentation

## 2019-05-30 DIAGNOSIS — Z833 Family history of diabetes mellitus: Secondary | ICD-10-CM | POA: Insufficient documentation

## 2019-05-30 DIAGNOSIS — H42 Glaucoma in diseases classified elsewhere: Secondary | ICD-10-CM | POA: Diagnosis not present

## 2019-05-30 NOTE — Progress Notes (Addendum)
RITA, DEWINTER ([id number]) Visit Report for 05/30/2019 Allergy List Details Patient Name: [name], [name] Date of Service: 05/30/2019 2:15 PM Medical Record Number: [id number] Patient Account Number: [id number] Date of Birth/Sex: 1940-08-26 (79 y.o. M) Treating RN: Army Melia Primary Care Elka Satterfield: Elsie Stain Other Clinician: Referring Baptiste Littler: Elsie Stain Treating Lawernce Earll/Extender: STONE III, HOYT Weeks in Treatment: 0 Allergies Active Allergies morphine codeine Allergy Notes Electronic Signature(s) Signed: 05/30/2019 4:27:58 PM By: Army Melia Entered By: Army Melia on 05/30/2019 14:52:38 [name] ([id number]) -------------------------------------------------------------------------------- Arrival Information Details Patient Name: [name] Date of Service: 05/30/2019 2:15 PM Medical Record Number: [id number] Patient Account Number: [id number] Date of Birth/Sex: 01-02-1941 (79 y.o. M) Treating RN: Montey Hora Primary Care Claudean Leavelle: Elsie Stain Other Clinician: Referring Lorah Kalina: Elsie Stain Treating Phillp Dolores/Extender: Melburn Hake, HOYT Weeks in Treatment: 0 Visit Information Patient Arrived: Ambulatory Arrival Time: 14:46 Accompanied By: wife Transfer Assistance: None Patient Identification Verified: Yes Secondary Verification Process Completed: Yes Patient Has Alerts: Yes Patient Alerts: DMII Electronic Signature(s) Signed: 05/30/2019 4:45:34 PM By: Montey Hora Entered By: Montey Hora on 05/30/2019 15:17:43 [name] ([id number]) -------------------------------------------------------------------------------- Clinic Level of Care Assessment Details Patient Name: [name] Date of Service: 05/30/2019 2:15 PM Medical Record Number: [id number] Patient Account Number: [id number] Date of Birth/Sex: January 09, 1941 (79 y.o. M) Treating RN: Montey Hora Primary Care Caleb Prigmore: Elsie Stain Other Clinician: Referring  Chiara Coltrin: Elsie Stain Treating Micalah Cabezas/Extender: Melburn Hake, HOYT Weeks in Treatment: 0 Clinic Level of Care Assessment Items TOOL 1 Quantity Score []  - Use when EandM and Procedure is performed on INITIAL visit 0 ASSESSMENTS - Nursing Assessment / Reassessment X - General Physical Exam (combine w/ comprehensive assessment (listed just below) when performed on new pt. 1 20 evals) X- 1 25 Comprehensive Assessment (HX, ROS, Risk Assessments, Wounds Hx, etc.) ASSESSMENTS - Wound and Skin Assessment / Reassessment []  - Dermatologic / Skin Assessment (not related to wound area) 0 ASSESSMENTS - Ostomy and/or Continence Assessment and Care []  - Incontinence Assessment and Management 0 []  - 0 Ostomy Care Assessment and Management (repouching, etc.) PROCESS - Coordination of Care X - Simple Patient / Family Education for ongoing care 1 15 []  - 0 Complex (extensive) Patient / Family Education for ongoing care X- 1 10 Staff obtains Programmer, systems, Records, Test Results / Process Orders []  - 0 Staff telephones HHA, Nursing Homes / Clarify orders / etc []  - 0 Routine Transfer to another Facility (non-emergent condition) []  - 0 Routine Hospital Admission (non-emergent condition) X- 1 15 New Admissions / Biomedical engineer / Ordering NPWT, Apligraf, etc. []  - 0 Emergency Hospital Admission (emergent condition) PROCESS - Special Needs []  - Pediatric / Minor Patient Management 0 []  - 0 Isolation Patient Management []  - 0 Hearing / Language / Visual special needs []  - 0 Assessment of Community assistance (transportation, D/C planning, etc.) []  - 0 Additional assistance / Altered mentation X- 1 15 Support Surface(s) Assessment (bed, cushion, seat, etc.) INTERVENTIONS - Miscellaneous []  - External ear exam 0 []  - 0 Patient Transfer (multiple staff / Civil Service fast streamer / Similar devices) []  - 0 Simple Staple / Suture removal (25 or less) []  - 0 Complex Staple / Suture removal (26 or  more) []  - 0 Hypo/Hyperglycemic Management (do not check if billed separately) [name], [name] ([id number]) []  - 0 Ankle / Brachial Index (ABI) - do not check if billed separately Has the patient been seen at the hospital within the last three years: Yes Total Score: 100 Level Of  Care: New/Established - Level 3 Electronic Signature(s) Signed: 05/30/2019 4:45:34 PM By: Montey Hora Entered By: Montey Hora on 05/30/2019 15:17:18 [name] ([id number]) -------------------------------------------------------------------------------- Encounter Discharge Information Details Patient Name: [name] Date of Service: 05/30/2019 2:15 PM Medical Record Number: [id number] Patient Account Number: [id number] Date of Birth/Sex: 12/08/1940 (79 y.o. M) Treating RN: Montey Hora Primary Care Tung Pustejovsky: Elsie Stain Other Clinician: Referring Cydnie Deason: Elsie Stain Treating Ylonda Storr/Extender: Melburn Hake, HOYT Weeks in Treatment: 0 Encounter Discharge Information Items Post Procedure Vitals Discharge Condition: Stable Temperature (F): 98.5 Ambulatory Status: Ambulatory Pulse (bpm): 90 Discharge Destination: Home Respiratory Rate (breaths/min): 16 Transportation: Private Auto Blood Pressure (mmHg): 172/91 Accompanied By: wife Schedule Follow-up Appointment: Yes Clinical Summary of Care: Electronic Signature(s) Signed: 05/30/2019 4:45:34 PM By: Montey Hora Entered By: Montey Hora on 05/30/2019 15:18:45 [name] ([id number]) -------------------------------------------------------------------------------- Lower Extremity Assessment Details Patient Name: [name] Date of Service: 05/30/2019 2:15 PM Medical Record Number: [id number] Patient Account Number: [id number] Date of Birth/Sex: [birth year] (79 y.o. M) Treating RN: Army Melia Primary Care Kasey Hansell: Elsie Stain Other Clinician: Referring Stiven Kaspar: Elsie Stain Treating Cheridan Kibler/Extender: Melburn Hake, HOYT Weeks in Treatment: 0 Electronic Signature(s) Signed: 05/30/2019 4:27:58 PM By: Army Melia Entered By: Army Melia on 05/30/2019 15:01:07 [name] ([id number]) -------------------------------------------------------------------------------- Multi Wound Chart Details Patient Name: [name] Date of Service: 05/30/2019 2:15 PM Medical Record Number: [id number] Patient Account Number: [id number] Date of Birth/Sex: July 20, 1940 (79 y.o. M) Treating RN: Montey Hora Primary Care Jaykub Mackins: Elsie Stain Other Clinician: Referring Jermon Chalfant: Elsie Stain Treating Lamiah Marmol/Extender: Melburn Hake, HOYT Weeks in Treatment: 0 Vital Signs Height(in): 72 Pulse(bpm): 90 Weight(lbs): 275 Blood Pressure(mmHg): 172/91 Body Mass Index(BMI): 37 Temperature(F): 98.5 Respiratory Rate(breaths/min): 18 Photos: [N/A:N/A] Wound Location: Left Gluteus N/A N/A Wounding Event: Pressure Injury N/A N/A Primary Etiology: Pressure Ulcer N/A N/A Comorbid History: Glaucoma, Hypertension, Type II N/A N/A Diabetes, Gout Date Acquired: 01/27/2019 N/A N/A Weeks of Treatment: 0 N/A N/A Wound Status: Open N/A N/A Measurements L x W x D (cm) 1x1.1x0.1 N/A N/A Area (cm) : 0.864 N/A N/A Volume (cm) : 0.086 N/A N/A % Reduction in Area: 0.00% N/A N/A % Reduction in Volume: 0.00% N/A N/A Classification: Category/Stage III N/A N/A Exudate Amount: Medium N/A N/A Exudate Type: Serosanguineous N/A N/A Exudate Color: red, brown N/A N/A Wound Margin: Flat and Intact N/A N/A Granulation Amount: Small (1-33%) N/A N/A Granulation Quality: Pink N/A N/A Necrotic Amount: Large (67-100%) N/A N/A Exposed Structures: Fat Layer (Subcutaneous Tissue) N/A N/A Exposed: Yes Fascia: No Tendon: No Muscle: No Joint: No Bone: No Epithelialization: None N/A N/A Treatment Notes Electronic Signature(s) Signed: 05/30/2019 4:45:34 PM By: Montey Hora Entered By: Montey Hora on 05/30/2019  15:13:46 [name] ([id number] Beck, Ok Edwards ([id number]) -------------------------------------------------------------------------------- [place] Details Patient Name: [name] Date of Service: 05/30/2019 2:15 PM Medical Record Number: [id number] Patient Account Number: [id number] Date of Birth/Sex: 1940-12-27 (79 y.o. M) Treating RN: Montey Hora Primary Care Braiden Rodman: Elsie Stain Other Clinician: Referring Neala Miggins: Elsie Stain Treating Lorenso Quirino/Extender: Melburn Hake, HOYT Weeks in Treatment: 0 Active Inactive Abuse / Safety / Falls / Self Care Management Nursing Diagnoses: Potential for falls Goals: Patient will not experience any injury related to falls Date Initiated: 05/30/2019 Target Resolution Date: 09/02/2019 Goal Status: Active Interventions: Assess fall risk on admission and as needed Notes: Orientation to the Wound Care Program Nursing Diagnoses: Knowledge deficit related to the wound healing center program Goals: Patient/caregiver will verbalize understanding of the Wound Healing  Center Program Date Initiated: 05/30/2019 Target Resolution Date: 09/02/2019 Goal Status: Active Interventions: Provide education on orientation to the wound center Notes: Pressure Nursing Diagnoses: Knowledge deficit related to causes and risk factors for pressure ulcer development Goals: Patient will remain free from development of additional pressure ulcers Date Initiated: 05/30/2019 Target Resolution Date: 09/02/2019 Goal Status: Active Interventions: Assess potential for pressure ulcer upon admission and as needed Notes: Wound/Skin Impairment Nursing Diagnoses: Impaired tissue integrity [name], [name] ([id number]) Goals: Ulcer/skin breakdown will heal within 14 weeks Date Initiated: 05/30/2019 Target Resolution Date: 09/02/2019 Goal Status: Active Interventions: Assess patient/caregiver ability to obtain necessary supplies Assess  patient/caregiver ability to perform ulcer/skin care regimen upon admission and as needed Assess ulceration(s) every visit Notes: Electronic Signature(s) Signed: 05/30/2019 4:45:34 PM By: Montey Hora Entered By: Montey Hora on 05/30/2019 15:12:36 [name] ([id number]) -------------------------------------------------------------------------------- Pain Assessment Details Patient Name: [name] Date of Service: 05/30/2019 2:15 PM Medical Record Number: [id number] Patient Account Number: [id number] Date of Birth/Sex: 1940-04-01 (79 y.o. M) Treating RN: Montey Hora Primary Care Evania Lyne: Elsie Stain Other Clinician: Referring Manvir Prabhu: Elsie Stain Treating Jackquline Branca/Extender: Melburn Hake, HOYT Weeks in Treatment: 0 Active Problems Location of Pain Severity and Description of Pain Patient Has Paino No Site Locations Pain Management and Medication Current Pain Management: Electronic Signature(s) Signed: 05/30/2019 4:01:06 PM By: Paulla Fore, RRT, CHT Signed: 05/30/2019 4:45:34 PM By: Montey Hora Entered By: Lorine Bears on 05/30/2019 14:47:28 [name] ([id number]) -------------------------------------------------------------------------------- Patient/Caregiver Education Details Patient Name: [name] Date of Service: 05/30/2019 2:15 PM Medical Record Number: [id number] Patient Account Number: [id number] Date of Birth/Gender: [birth year] (79 y.o. M) Treating RN: Montey Hora Primary Care Physician: Elsie Stain Other Clinician: Referring Physician: Elsie Stain Treating Physician/Extender: Sharalyn Ink in Treatment: 0 Education Assessment Education Provided To: Patient and Caregiver Education Topics Provided Pressure: Handouts: Other: pressure relief Methods: Explain/Verbal Responses: State content correctly Wound/Skin Impairment: Handouts: Other: wound care as ordered Methods:  Demonstration, Explain/Verbal Responses: State content correctly Electronic Signature(s) Signed: 05/30/2019 4:45:34 PM By: Montey Hora Entered By: Montey Hora on 05/30/2019 15:16:49 [name] ([id number]) -------------------------------------------------------------------------------- Wound Assessment Details Patient Name: [name] Date of Service: 05/30/2019 2:15 PM Medical Record Number: [id number] Patient Account Number: [id number] Date of Birth/Sex: 04-30-1940 (79 y.o. M) Treating RN: Montey Hora Primary Care Christan Ciccarelli: Elsie Stain Other Clinician: Referring Aaleeyah Bias: Elsie Stain Treating Mohamed Portlock/Extender: Melburn Hake, HOYT Weeks in Treatment: 0 Wound Status Wound Number: 1 Primary Etiology: Pressure Ulcer Wound Location: Left Gluteus Wound Status: Open Wounding Event: Pressure Injury Comorbid History: Glaucoma, Hypertension, Type II Diabetes, Gout Date Acquired: 01/27/2019 Weeks Of Treatment: 0 Clustered Wound: No Photos Wound Measurements Length: (cm) 1 % Red Width: (cm) 1.1 % Red Depth: (cm) 0.1 Epith Area: (cm) 0.864 Tunn Volume: (cm) 0.086 Unde uction in Area: 0% uction in Volume: 0% elialization: None eling: No rmining: No Wound Description Classification: Category/Stage III Foul Wound Margin: Flat and Intact Sloug Exudate Amount: Medium Exudate Type: Serosanguineous Exudate Color: red, brown Odor After Cleansing: No h/Fibrino Yes Wound Bed Granulation Amount: Small (1-33%) Exposed Structure Granulation Quality: Pink Fascia Exposed: No Necrotic Amount: Large (67-100%) Fat Layer (Subcutaneous Tissue) Exposed: Yes Necrotic Quality: Adherent Slough Tendon Exposed: No Muscle Exposed: No Joint Exposed: No Bone Exposed: No Treatment Notes Wound #1 (Left Gluteus) Notes prisma, BFD Electronic Signature(s) Signed: 05/30/2019 4:45:34 PM By: Kathlene Cote, Ok Edwards ([id number]) Entered By: Montey Hora on 05/30/2019  15:22:23 [name] ([id number]) --------------------------------------------------------------------------------  Vitals Details Patient Name: [name], [name] Date of Service: 05/30/2019 2:15 PM Medical Record Number: [id number] Patient Account Number: [id number] Date of Birth/Sex: 1941/03/30 (79 y.o. M) Treating RN: Montey Hora Primary Care Alizabeth Antonio: Elsie Stain Other Clinician: Referring Terrace Chiem: Elsie Stain Treating Esti Demello/Extender: Melburn Hake, HOYT Weeks in Treatment: 0 Vital Signs Time Taken: 14:45 Temperature (F): 98.5 Height (in): 72 Pulse (bpm): 90 Source: Stated Respiratory Rate (breaths/min): 18 Weight (lbs): 275 Blood Pressure (mmHg): 172/91 Source: Measured Reference Range: 80 - 120 mg / dl Body Mass Index (BMI): 37.3 Electronic Signature(s) Signed: 05/30/2019 4:01:06 PM By: Lorine Bears RCP, RRT, CHT Entered By: Lorine Bears on 05/30/2019 14:49:08

## 2019-05-30 NOTE — Progress Notes (Signed)
[name], [name] ([id number]) Visit Report for 05/30/2019 Abuse/Suicide Risk Screen Details Patient Name: [name], [name] Date of Service: 05/30/2019 2:15 PM Medical Record Number: [id number] Patient Account Number: [id number] Date of Birth/Sex: 08-06-1940 (79 y.o. M) Treating RN: Army Melia Primary Care Cayman Kielbasa: Elsie Stain Other Clinician: Referring Jaritza Duignan: Elsie Stain Treating Lavontay Kirk/Extender: Melburn Hake, HOYT Weeks in Treatment: 0 Abuse/Suicide Risk Screen Items Answer ABUSE RISK SCREEN: Has anyone close to you tried to hurt or harm you recentlyo No Do you feel uncomfortable with anyone in your familyo No Has anyone forced you do things that you didnot want to doo No Electronic Signature(s) Signed: 05/30/2019 4:27:58 PM By: Army Melia Entered By: Army Melia on 05/30/2019 14:55:54 [name] ([id number]) -------------------------------------------------------------------------------- Activities of Daily Living Details Patient Name: [name] Date of Service: 05/30/2019 2:15 PM Medical Record Number: [id number] Patient Account Number: [id number] Date of Birth/Sex: 1941-01-11 (79 y.o. M) Treating RN: Army Melia Primary Care Lanorris Kalisz: Elsie Stain Other Clinician: Referring Raymone Pembroke: Elsie Stain Treating Coleen Cardiff/Extender: Melburn Hake, HOYT Weeks in Treatment: 0 Activities of Daily Living Items Answer Activities of Daily Living (Please select one for each item) Drive Automobile Not Able Take Medications Completely Able Use Telephone Completely Able Care for Appearance Completely Able Use Toilet Completely Able Bath / Shower Completely Able Dress Self Completely Able Feed Self Completely Able Walk Completely Able Get In / Out Bed Completely Able Housework Completely Able Prepare Meals Completely Melvin for Self Completely Able Electronic Signature(s) Signed: 05/30/2019 4:27:58 PM By: Army Melia Entered By:  Army Melia on 05/30/2019 14:56:05 [name] ([id number]) -------------------------------------------------------------------------------- Education Screening Details Patient Name: [name] Date of Service: 05/30/2019 2:15 PM Medical Record Number: [id number] Patient Account Number: [id number] Date of Birth/Sex: 09/13/1940 (79 y.o. M) Treating RN: Army Melia Primary Care Glenisha Gundry: Elsie Stain Other Clinician: Referring Colby Catanese: Elsie Stain Treating Colter Magowan/Extender: Melburn Hake, HOYT Weeks in Treatment: 0 Primary Learner Assessed: Patient Learning Preferences/Education Level/Primary Language Learning Preference: Explanation, Demonstration Highest Education Level: College or Above Preferred Language: English Cognitive Barrier Language Barrier: No Translator Needed: No Memory Deficit: No Emotional Barrier: No Cultural/Religious Beliefs Affecting Medical Care: No Physical Barrier Impaired Vision: No Impaired Hearing: No Decreased Hand dexterity: No Knowledge/Comprehension Knowledge Level: High Comprehension Level: High Ability to understand written instructions: High Ability to understand verbal instructions: High Motivation Anxiety Level: Calm Cooperation: Cooperative Education Importance: Acknowledges Need Interest in Health Problems: Asks Questions Perception: Coherent Willingness to Engage in Self-Management High Activities: Readiness to Engage in Self-Management High Activities: Electronic Signature(s) Signed: 05/30/2019 4:27:58 PM By: Army Melia Entered By: Army Melia on 05/30/2019 14:56:22 [name] ([id number]) -------------------------------------------------------------------------------- Fall Risk Assessment Details Patient Name: [name] Date of Service: 05/30/2019 2:15 PM Medical Record Number: [id number] Patient Account Number: [id number] Date of Birth/Sex: 02-27-1941 (79 y.o. M) Treating RN: Army Melia Primary  Care Mercia Dowe: Elsie Stain Other Clinician: Referring Avilyn Virtue: Elsie Stain Treating Venicia Vandall/Extender: Melburn Hake, HOYT Weeks in Treatment: 0 Fall Risk Assessment Items Have you had 2 or more falls in the last 12 monthso 0 No Have you had any fall that resulted in injury in the last 12 monthso 0 Yes FALLS RISK SCREEN History of falling - immediate or within 3 months 0 No Secondary diagnosis (Do you have 2 or more medical diagnoseso) 0 No Ambulatory aid None/bed rest/wheelchair/nurse 0 No Crutches/cane/walker 15 Yes Furniture 0 No Intravenous therapy Access/Saline/Heparin Lock 0 No Gait/Transferring Normal/ bed rest/ wheelchair 0 No Weak (short  steps with or without shuffle, stooped but able to lift head while walking, may seek 10 Yes support from furniture) Impaired (short steps with shuffle, may have difficulty arising from chair, head down, impaired 0 No balance) Mental Status Oriented to own ability 0 No Electronic Signature(s) Signed: 05/30/2019 4:27:58 PM By: Army Melia Entered By: Army Melia on 05/30/2019 14:56:32 [name] ([id number]) -------------------------------------------------------------------------------- Nutrition Risk Screening Details Patient Name: [name] Date of Service: 05/30/2019 2:15 PM Medical Record Number: [id number] Patient Account Number: [id number] Date of Birth/Sex: 02/20/1941 (79 y.o. M) Treating RN: Army Melia Primary Care Fordyce Lepak: Elsie Stain Other Clinician: Referring Fiana Gladu: Elsie Stain Treating Dua Mehler/Extender: Melburn Hake, HOYT Weeks in Treatment: 0 Height (in): 72 Weight (lbs): 275 Body Mass Index (BMI): 37.3 Nutrition Risk Screening Items Score Screening NUTRITION RISK SCREEN: I have an illness or condition that made me change the kind and/or amount of food I eat 0 No I eat fewer than two meals per day 0 No I eat few fruits and vegetables, or milk products 0 No I have three or more drinks of  beer, liquor or wine almost every day 0 No I have tooth or mouth problems that make it hard for me to eat 0 No I don't always have enough money to buy the food I need 0 No I eat alone most of the time 0 No I take three or more different prescribed or over-the-counter drugs a day 0 No Without wanting to, I have lost or gained 10 pounds in the last six months 0 No I am not always physically able to shop, cook and/or feed myself 0 No Nutrition Protocols Good Risk Protocol 0 No interventions needed Moderate Risk Protocol High Risk Proctocol Risk Level: Good Risk Score: 0 Electronic Signature(s) Signed: 05/30/2019 4:27:58 PM By: Army Melia Entered By: Army Melia on 05/30/2019 14:56:37

## 2019-05-30 NOTE — Progress Notes (Addendum)
KAIVEN, NEASON ([id number]) Visit Report for 05/30/2019 Chief Complaint Document Details Patient Name: [name], [name] Date of Service: 05/30/2019 2:15 PM Medical Record Number: [id number] Patient Account Number: [id number] Date of Birth/Sex: February 23, 1941 (78 y.o. M) Treating RN: Montey Hora Primary Care Provider: Elsie Stain Other Clinician: Referring Provider: Elsie Stain Treating Provider/Extender: Melburn Hake, Cagney Steenson Weeks in Treatment: 0 Information Obtained from: Patient Chief Complaint Left gluteal pressure ulcer Electronic Signature(s) Signed: 05/30/2019 5:24:26 PM By: Worthy Keeler PA-C Previous Signature: 05/30/2019 2:45:39 PM Version By: Worthy Keeler PA-C Entered By: Worthy Keeler on 05/30/2019 17:24:26 [name] ([id number]) -------------------------------------------------------------------------------- Debridement Details Patient Name: [name] Date of Service: 05/30/2019 2:15 PM Medical Record Number: [id number] Patient Account Number: [id number] Date of Birth/Sex: 1940-11-17 (78 y.o. M) Treating RN: Montey Hora Primary Care Provider: Elsie Stain Other Clinician: Referring Provider: Elsie Stain Treating Provider/Extender: Melburn Hake, Evian Derringer Weeks in Treatment: 0 Debridement Performed for Wound #1 Left Gluteus Assessment: Performed By: Physician STONE III, Paisley Grajeda E., PA-C Debridement Type: Debridement Level of Consciousness (Pre- Awake and Alert procedure): Pre-procedure Verification/Time Out Yes - 15:14 Taken: Start Time: 15:14 Pain Control: Lidocaine 4% Topical Solution Total Area Debrided (L x W): 1 (cm) x 1.1 (cm) = 1.1 (cm) Tissue and other material debrided: Viable, Non-Viable, Slough, Subcutaneous, Slough Level: Skin/Subcutaneous Tissue Debridement Description: Excisional Instrument: Curette Bleeding: Minimum Hemostasis Achieved: Pressure End Time: 15:16 Procedural Pain: 0 Post Procedural Pain: 0 Response to Treatment:  Procedure was tolerated well Level of Consciousness (Post- Awake and Alert procedure): Post Debridement Measurements of Total Wound Length: (cm) 1 Stage: Category/Stage III Width: (cm) 1.1 Depth: (cm) 0.2 Volume: (cm) 0.173 Character of Wound/Ulcer Post Debridement: Improved Post Procedure Diagnosis Same as Pre-procedure Electronic Signature(s) Signed: 05/30/2019 4:45:34 PM By: Montey Hora Signed: 06/01/2019 5:37:52 PM By: Worthy Keeler PA-C Entered By: Montey Hora on 05/30/2019 15:15:27 [name] ([id number]) -------------------------------------------------------------------------------- HPI Details Patient Name: [name] Date of Service: 05/30/2019 2:15 PM Medical Record Number: [id number] Patient Account Number: [id number] Date of Birth/Sex: [birth year] (78 y.o. M) Treating RN: Montey Hora Primary Care Provider: Elsie Stain Other Clinician: Referring Provider: Elsie Stain Treating Provider/Extender: Melburn Hake, Arun Herrod Weeks in Treatment: 0 History of Present Illness HPI Description: 05/30/2019 upon evaluation today patient appears for initial evaluation in our clinic concerning issues that he has been having on the left gluteal region due to a pressure ulcer unfortunately. He does have a lot of weakness as well as issues with hip pain that causes him to not be able to sleep in a bed that has been quite a problem for him unfortunately. 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 Nonetheless this has led to chronic pain in the right hip. The patient is slow-moving but is able to ambulate today. He does typically sleep in his lift chair. He does not really move around as much as he probably should although he can and I think this is one area where I am going to suggest that we go ahead and have him get up and  move around more frequently to try to help with offloading I discussed how this can be beneficial. The good news is his wounds do not seem to be too deep. No fever chills noted Electronic Signature(s) Signed: 05/30/2019 5:24:31 PM By: Worthy Keeler PA-C Entered By: Melburn Hake,  Sui Kasparek on 05/30/2019 17:24:30 [name], [name] ([id number]) -------------------------------------------------------------------------------- Physical Exam Details Patient Name: [name], [name] Date of Service: 05/30/2019 2:15 PM Medical Record Number: [id number] Patient Account Number: [id number] Date of Birth/Sex: 1940/07/26 (78 y.o. M) Treating RN: Montey Hora Primary Care Provider: Elsie Stain Other Clinician: Referring Provider: Elsie Stain Treating Provider/Extender: Melburn Hake, Illiana Losurdo Weeks in Treatment: 0 Constitutional patient is hypertensive.. pulse regular and within target range for patient.[name] respirations regular, non-labored and within target range for patient.[name] temperature within target range for patient.. Well-nourished and well-hydrated in no acute distress. Eyes conjunctiva clear no eyelid edema noted. pupils equal round and reactive to light and accommodation. Ears, Nose, Mouth, and Throat no gross abnormality of ear auricles or external auditory canals. normal hearing noted during conversation. mucus membranes moist. Respiratory normal breathing without difficulty. Cardiovascular no clubbing, cyanosis, significant edema, <3 sec cap refill. Musculoskeletal stooped posture. Patient does have right hip arthritis with the replacement that unfortunately did not go well he does have a lot of pain and limitation as far as range of motion in the right hip.[name] Psychiatric this patient is able to make decisions and demonstrates good insight into disease process. Alert and Oriented x 3. pleasant and cooperative. Notes Upon inspection today patient's wound bed actually showed signs of some slough noted  on the surface of the wound unfortunately that is going require sharp debridement today. I discussed with the patient this may cause some discomfort but I am to try to do this as carefully as possible. He was in agreement with allowing me to try this. I was able to sharply debride away the necrotic tissue without severe pain though he did have discomfort this was not tremendous for him which is good news. Post debridement wound bed appears to be doing much better. Electronic Signature(s) Signed: 05/30/2019 5:25:34 PM By: Worthy Keeler PA-C Entered By: Worthy Keeler on 05/30/2019 17:25:34 [name] ([id number]) -------------------------------------------------------------------------------- Physician Orders Details Patient Name: [name] Date of Service: 05/30/2019 2:15 PM Medical Record Number: [id number] Patient Account Number: [id number] Date of Birth/Sex: April 13, 1940 (78 y.o. M) Treating RN: Montey Hora Primary Care Provider: Elsie Stain Other Clinician: Referring Provider: Elsie Stain Treating Provider/Extender: Melburn Hake, Charna Neeb Weeks in Treatment: 0 Verbal / Phone Orders: No Diagnosis Coding Wound Cleansing Wound #1 Left Gluteus o Clean wound with Normal Saline. o Dial antibacterial soap, wash wounds, rinse and pat dry prior to dressing wounds o May Shower, gently pat wound dry prior to applying new dressing. Skin Barriers/Peri-Wound Care Wound #1 Left Gluteus o Skin Prep Primary Wound Dressing Wound #1 Left Gluteus o Silver Collagen Secondary Dressing Wound #1 Left Gluteus o Boardered Foam Dressing Dressing Change Frequency Wound #1 Left Gluteus o Change dressing every other day. - and as needed Follow-up Appointments o Return Appointment in 1 week. Off-Loading Wound #1 Left Gluteus o Turn and reposition every 2 hours Electronic Signature(s) Signed: 05/30/2019 4:45:34 PM By: Montey Hora Signed: 06/01/2019 5:37:52 PM By: Worthy Keeler PA-C Entered By: Montey Hora on 05/30/2019 15:21:43 [name] ([id number]) -------------------------------------------------------------------------------- Problem List Details Patient Name: [name] Date of Service: 05/30/2019 2:15 PM Medical Record Number: [id number] Patient Account Number: [id number] Date of Birth/Sex: [birth year] (78 y.o. M) Treating RN: Montey Hora Primary Care Provider: Elsie Stain Other Clinician: Referring Provider: Elsie Stain Treating Provider/Extender: Melburn Hake, Goodwin Kamphaus Weeks in Treatment: 0 Active Problems ICD-10 Evaluated Encounter Code Description Active Date Today Diagnosis [phone] Pressure ulcer of left buttock, stage  3 05/30/2019 No Yes I10 Essential (primary) hypertension 05/30/2019 No Yes E11.622 Type 2 diabetes mellitus with other skin ulcer 05/30/2019 No Yes M62.81 Muscle weakness (generalized) 05/30/2019 No Yes Inactive Problems Resolved Problems Electronic Signature(s) Signed: 05/30/2019 5:24:11 PM By: Worthy Keeler PA-C Previous Signature: 05/30/2019 2:45:32 PM Version By: Worthy Keeler PA-C Entered By: Worthy Keeler on 05/30/2019 17:24:11 [name] ([id number]) -------------------------------------------------------------------------------- Progress Note Details Patient Name: [name] Date of Service: 05/30/2019 2:15 PM Medical Record Number: [id number] Patient Account Number: [id number] Date of Birth/Sex: [birth year] (78 y.o. M) Treating RN: Montey Hora Primary Care Provider: Elsie Stain Other Clinician: Referring Provider: Elsie Stain Treating Provider/Extender: Melburn Hake, Maicy Filip Weeks in Treatment: 0 Subjective Chief Complaint Information obtained from Patient Left gluteal pressure ulcer History of Present Illness (HPI) 05/30/2019 upon evaluation today patient appears for initial evaluation in our clinic concerning issues that he has been having on the left gluteal region due to a  pressure ulcer unfortunately. He does have a lot of weakness as well as issues with hip pain that causes him to not be able to sleep in a bed that has been quite a problem for him unfortunately. 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 Nonetheless this has led to chronic pain in the right hip. The patient is slow-moving but is able to ambulate today. He does typically sleep in his lift chair. He does not really move around as much as he probably should although he can and I think this is one area where I am going to suggest that we go ahead and have him get up and move around more frequently to try to help with offloading I discussed how this can be beneficial. The good news is his wounds do not seem to be too deep. No fever chills noted Patient History Information obtained from Patient. Allergies morphine, codeine Family History Cancer - Father, Diabetes - Siblings, Hypertension - Mother,Father,Siblings, No family history of Heart Disease, Hereditary Spherocytosis, Kidney Disease, Lung Disease, Seizures, Stroke, Thyroid Problems, Tuberculosis. Social History Never smoker, Marital Status - Married, Alcohol Use - Rarely, Drug Use - No History, Caffeine Use - Daily. Medical History Eyes Patient has history of Glaucoma Denies history of Cataracts, Optic Neuritis Hematologic/Lymphatic Denies history of Anemia, Hemophilia, Human Immunodeficiency Virus, Lymphedema, Sickle Cell Disease Respiratory Denies history of Aspiration, Asthma, Chronic Obstructive Pulmonary Disease (COPD), Pneumothorax, Sleep Apnea, Tuberculosis Cardiovascular Patient has history of Hypertension Denies history of Angina, Arrhythmia, Congestive Heart Failure, Coronary Artery Disease, Deep Vein Thrombosis, Hypotension, Myocardial Infarction, Peripheral Arterial Disease,  Peripheral Venous Disease, Phlebitis, Vasculitis Gastrointestinal Denies history of Cirrhosis , Colitis, Crohn s, Hepatitis A, Hepatitis B, Hepatitis C Endocrine Patient has history of Type II Diabetes Genitourinary Denies history of End Stage Renal Disease Immunological Denies history of Lupus Erythematosus, Raynaud s, Scleroderma Integumentary (Skin) Denies history of History of Burn, History of pressure wounds Musculoskeletal Patient has history of Gout Denies history of Rheumatoid Arthritis, Osteoarthritis, Osteomyelitis Neurologic Denies history of Dementia, Neuropathy, Quadriplegia, Paraplegia, Seizure Disorder Oncologic Denies history of Received Chemotherapy, Received Radiation [name], [name] ([id number]) Psychiatric Denies history of Anorexia/bulimia, Confinement Anxiety Patient is treated with Insulin. Review of Systems (ROS) Constitutional Symptoms (General Health) Denies complaints or symptoms of Fatigue, Fever, Chills, Marked Weight Change. Eyes Complains or has symptoms of Glasses / Contacts - glasses. Denies complaints  or symptoms of Dry Eyes, Vision Changes. Ear/Nose/Mouth/Throat Denies complaints or symptoms of Difficult clearing ears, Sinusitis. Hematologic/Lymphatic Denies complaints or symptoms of Bleeding / Clotting Disorders, Human Immunodeficiency Virus. Respiratory Denies complaints or symptoms of Chronic or frequent coughs, Shortness of Breath. Cardiovascular Denies complaints or symptoms of Chest pain, LE edema. Gastrointestinal Denies complaints or symptoms of Frequent diarrhea, Nausea, Vomiting. Endocrine Denies complaints or symptoms of Hepatitis, Thyroid disease, Polydypsia (Excessive Thirst). Genitourinary Denies complaints or symptoms of Kidney failure/ Dialysis, Incontinence/dribbling. Immunological Denies complaints or symptoms of Hives, Itching. Integumentary (Skin) Complains or has symptoms of Wounds. Denies complaints or symptoms of  Bleeding or bruising tendency, Breakdown, Swelling. Musculoskeletal Denies complaints or symptoms of Muscle Pain, Muscle Weakness. Neurologic Denies complaints or symptoms of Numbness/parasthesias, Focal/Weakness. Psychiatric Denies complaints or symptoms of Anxiety, Claustrophobia. Objective Constitutional patient is hypertensive.. pulse regular and within target range for patient.[name] respirations regular, non-labored and within target range for patient.[name] temperature within target range for patient.. Well-nourished and well-hydrated in no acute distress. Vitals Time Taken: 2:45 PM, Height: 72 in, Source: Stated, Weight: 275 lbs, Source: Measured, BMI: 37.3, Temperature: 98.5 F, Pulse: 90 bpm, Respiratory Rate: 18 breaths/min, Blood Pressure: 172/91 mmHg. Eyes conjunctiva clear no eyelid edema noted. pupils equal round and reactive to light and accommodation. Ears, Nose, Mouth, and Throat no gross abnormality of ear auricles or external auditory canals. normal hearing noted during conversation. mucus membranes moist. Respiratory normal breathing without difficulty. Cardiovascular no clubbing, cyanosis, significant edema, Musculoskeletal stooped posture. Patient does have right hip arthritis with the replacement that unfortunately did not go well he does have a lot of pain and limitation as far as range of motion in the right hip.[name] Psychiatric [name], [name] ([id number]) this patient is able to make decisions and demonstrates good insight into disease process. Alert and Oriented x 3. pleasant and cooperative. General Notes: Upon inspection today patient's wound bed actually showed signs of some slough noted on the surface of the wound unfortunately that is going require sharp debridement today. I discussed with the patient this may cause some discomfort but I am to try to do this as carefully as possible. He was in agreement with allowing me to try this. I was able to sharply debride  away the necrotic tissue without severe pain though he did have discomfort this was not tremendous for him which is good news. Post debridement wound bed appears to be doing much better. Integumentary (Hair, Skin) Wound #1 status is Open. Original cause of wound was Pressure Injury. The wound is located on the Left Gluteus. The wound measures 1cm length x 1.1cm width x 0.1cm depth; 0.864cm^2 area and 0.086cm^3 volume. There is Fat Layer (Subcutaneous Tissue) Exposed exposed. There is no tunneling or undermining noted. There is a medium amount of serosanguineous drainage noted. The wound margin is flat and intact. There is small (1-33%) pink granulation within the wound bed. There is a large (67-100%) amount of necrotic tissue within the wound bed including Adherent Slough. Assessment Active Problems ICD-10 Pressure ulcer of left buttock, stage 3 Essential (primary) hypertension Type 2 diabetes mellitus with other skin ulcer Muscle weakness (generalized) Procedures Wound #1 Pre-procedure diagnosis of Wound #1 is a Pressure Ulcer located on the Left Gluteus . There was a Excisional Skin/Subcutaneous Tissue Debridement with a total area of 1.1 sq cm performed by STONE III, Athen Riel E., PA-C. With the following instrument(s): Curette to remove Viable and Non-Viable tissue/material. Material removed includes Subcutaneous Tissue and Slough and after  achieving pain control using Lidocaine 4% Topical Solution. No specimens were taken. A time out was conducted at 15:14, prior to the start of the procedure. A Minimum amount of bleeding was controlled with Pressure. The procedure was tolerated well with a pain level of 0 throughout and a pain level of 0 following the procedure. Post Debridement Measurements: 1cm length x 1.1cm width x 0.2cm depth; 0.173cm^3 volume. Post debridement Stage noted as Category/Stage III. Character of Wound/Ulcer Post Debridement is improved. Post procedure Diagnosis Wound #1:  Same as Pre-Procedure Plan Wound Cleansing: Wound #1 Left Gluteus: Clean wound with Normal Saline. Dial antibacterial soap, wash wounds, rinse and pat dry prior to dressing wounds May Shower, gently pat wound dry prior to applying new dressing. Skin Barriers/Peri-Wound Care: Wound #1 Left Gluteus: Skin Prep Primary Wound Dressing: Wound #1 Left Gluteus: Silver Collagen Secondary Dressing: Wound #1 Left Gluteus: Boardered Foam Dressing Dressing Change Frequency: Wound #1 Left Gluteus: Change dressing every other day. - and as needed Follow-up Appointments: [name], [name] ([id number]) Return Appointment in 1 week. Off-Loading: Wound #1 Left Gluteus: Turn and reposition every 2 hours 1. My suggestion at this time is good to be that we go ahead and initiate treatment with a silver collagen dressing. I think this is good to be a good option for him. I do believe that he can shower using Dial antibacterial soap to clean the area but I would keep this covered with a dressing at all times. 2. And get a set just a border foam dressing over top to help pad and cushion the area. 3. I did suggest repositioning at least every 2 hours although to be honest actually suggested that he get up and walk every hour for around 5 to 10 minutes which will actually get the blood flowing and should help prevent anything from worsening and breaking down. I understand this is somewhat of a chore but nonetheless I think it may go a very long way to help with this to heal effectively and quickly. The patient's wife and the patient were present during this talk. We will see patient back for reevaluation in 1 week here in the clinic. If anything worsens or changes patient will contact our office for additional recommendations. Electronic Signature(s) Signed: 05/30/2019 5:26:38 PM By: Worthy Keeler PA-C Entered By: Worthy Keeler on 05/30/2019 17:26:38 [name]  ([id number]) -------------------------------------------------------------------------------- ROS/PFSH Details Patient Name: [name] Date of Service: 05/30/2019 2:15 PM Medical Record Number: [id number] Patient Account Number: [id number] Date of Birth/Sex: Aug 09, 1940 (78 y.o. M) Treating RN: Army Melia Primary Care Provider: Elsie Stain Other Clinician: Referring Provider: Elsie Stain Treating Provider/Extender: Melburn Hake, Ulysees Robarts Weeks in Treatment: 0 Information Obtained From Patient Constitutional Symptoms (General Health) Complaints and Symptoms: Negative for: Fatigue; Fever; Chills; Marked Weight Change Eyes Complaints and Symptoms: Positive for: Glasses / Contacts - glasses Negative for: Dry Eyes; Vision Changes Medical History: Positive for: Glaucoma Negative for: Cataracts; Optic Neuritis Ear/Nose/Mouth/Throat Complaints and Symptoms: Negative for: Difficult clearing ears; Sinusitis Hematologic/Lymphatic Complaints and Symptoms: Negative for: Bleeding / Clotting Disorders; Human Immunodeficiency Virus Medical History: Negative for: Anemia; Hemophilia; Human Immunodeficiency Virus; Lymphedema; Sickle Cell Disease Respiratory Complaints and Symptoms: Negative for: Chronic or frequent coughs; Shortness of Breath Medical History: Negative for: Aspiration; Asthma; Chronic Obstructive Pulmonary Disease (COPD); Pneumothorax; Sleep Apnea; Tuberculosis Cardiovascular Complaints and Symptoms: Negative for: Chest pain; LE edema Medical History: Positive for: Hypertension Negative for: Angina; Arrhythmia; Congestive Heart Failure; Coronary Artery Disease; Deep Vein  Thrombosis; Hypotension; Myocardial Infarction; Peripheral Arterial Disease; Peripheral Venous Disease; Phlebitis; Vasculitis Gastrointestinal Complaints and Symptoms: Negative for: Frequent diarrhea; Nausea; Vomiting Medical History: Negative for: Cirrhosis ; Colitis; Crohnos; Hepatitis A; Hepatitis  B; Hepatitis C Endocrine Complaints and Symptoms: Negative for: Hepatitis; Thyroid disease; Polydypsia (Excessive Thirst) [name], Ronnie N. ([id number]) Medical History: Positive for: Type II Diabetes Time with diabetes: 15 years Treated with: Insulin Genitourinary Complaints and Symptoms: Negative for: Kidney failure/ Dialysis; Incontinence/dribbling Medical History: Negative for: End Stage Renal Disease Immunological Complaints and Symptoms: Negative for: Hives; Itching Medical History: Negative for: Lupus Erythematosus; Raynaudos; Scleroderma Integumentary (Skin) Complaints and Symptoms: Positive for: Wounds Negative for: Bleeding or bruising tendency; Breakdown; Swelling Medical History: Negative for: History of Burn; History of pressure wounds Musculoskeletal Complaints and Symptoms: Negative for: Muscle Pain; Muscle Weakness Medical History: Positive for: Gout Negative for: Rheumatoid Arthritis; Osteoarthritis; Osteomyelitis Neurologic Complaints and Symptoms: Negative for: Numbness/parasthesias; Focal/Weakness Medical History: Negative for: Dementia; Neuropathy; Quadriplegia; Paraplegia; Seizure Disorder Psychiatric Complaints and Symptoms: Negative for: Anxiety; Claustrophobia Medical History: Negative for: Anorexia/bulimia; Confinement Anxiety Oncologic Medical History: Negative for: Received Chemotherapy; Received Radiation HBO Extended History Items Eyes: Glaucoma Immunizations Pneumococcal Vaccine: Received Pneumococcal Vaccination: [name], [name] ([id number]) Implantable Devices None Family and Social History Cancer: Yes - Father; Diabetes: Yes - Siblings; Heart Disease: No; Hereditary Spherocytosis: No; Hypertension: Yes - Mother,Father,Siblings; Kidney Disease: No; Lung Disease: No; Seizures: No; Stroke: No; Thyroid Problems: No; Tuberculosis: No; Never smoker; Marital Status - Married; Alcohol Use: Rarely; Drug Use: No History; Caffeine  Use: Daily; Financial Concerns: No; Food, Clothing or Shelter Needs: No; Support System Lacking: No; Transportation Concerns: No Electronic Signature(s) Signed: 05/30/2019 4:27:58 PM By: Army Melia Signed: 06/01/2019 5:37:52 PM By: Worthy Keeler PA-C Entered By: Army Melia on 05/30/2019 14:55:47 [name] ([id number]) -------------------------------------------------------------------------------- SuperBill Details Patient Name: [name] Date of Service: 05/30/2019 Medical Record Number: [id number] Patient Account Number: [id number] Date of Birth/Sex: Jan 07, 1941 (78 y.o. M) Treating RN: Montey Hora Primary Care Provider: Elsie Stain Other Clinician: Referring Provider: Elsie Stain Treating Provider/Extender: Melburn Hake, Idrissa Beville Weeks in Treatment: 0 Diagnosis Coding ICD-10 Codes Code Description [phone] Pressure ulcer of left buttock, stage 3 I10 Essential (primary) hypertension E11.622 Type 2 diabetes mellitus with other skin ulcer M62.81 Muscle weakness (generalized) Facility Procedures CPT4 Code: [id number] Description: [id number] - WOUND CARE VISIT-LEV 3 EST PT Modifier: Quantity: 1 CPT4 Code: [id number] Description: [id number] - DEB SUBQ TISSUE 20 SQ CM/< Modifier: Quantity: 1 CPT4 Code: Description: ICD-10 Diagnosis Description 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, stage 3 Modifier: Quantity: Physician Procedures CPT4 [id number] Description: WC PHYS LEVEL 3 o NEW PT Modifier: 25 Quantity: 1 CPT4 Code: Description: ICD-10 Diagnosis Description 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

## 2019-05-31 DIAGNOSIS — L89320 Pressure ulcer of left buttock, unstageable: Secondary | ICD-10-CM | POA: Diagnosis not present

## 2019-06-01 ENCOUNTER — Other Ambulatory Visit: Payer: Self-pay | Admitting: Pharmacy Technician

## 2019-06-01 ENCOUNTER — Ambulatory Visit: Payer: Self-pay

## 2019-06-01 NOTE — Patient Outreach (Signed)
[place] [facility]) Care Management  06/01/2019  JAYLEEN JOAS [birth year] [id number]   Follow up call placed to [facility] regarding patient assistance application(s) for Trul[employer] , [place] confirms patient has been approved as of 3/1 until 03/29/2020. [facility] to reach out to patient to set up delivery.  Follow up:  Will follow up with patient in 3-5 business days to confirms shipment has been scheduled.  [name] [place] Certified Pharmacy Technician [place] Management Direct Dial:[phone]

## 2019-06-05 ENCOUNTER — Encounter: Payer: HMO | Admitting: Physician Assistant

## 2019-06-05 ENCOUNTER — Other Ambulatory Visit: Payer: Self-pay

## 2019-06-05 DIAGNOSIS — L89329 Pressure ulcer of left buttock, unspecified stage: Secondary | ICD-10-CM | POA: Diagnosis not present

## 2019-06-05 DIAGNOSIS — E11622 Type 2 diabetes mellitus with other skin ulcer: Secondary | ICD-10-CM | POA: Diagnosis not present

## 2019-06-05 NOTE — Progress Notes (Addendum)
[name], [name] ([id number]) Visit Report for 06/05/2019 Chief Complaint Document Details Patient Name: [name] Date of Service: 06/05/2019 9:15 AM Medical Record Number: [id number] Patient Account Number: [id number] Date of Birth/Sex: 1940-05-05 (78 y.o. M) Treating RN: Primary Care Provider: Elsie Stain Other Clinician: Referring Provider: Elsie Stain Treating Provider/Extender: Melburn Hake, Talajah Slimp Weeks in Treatment: 0 Information Obtained from: Patient Chief Complaint Left gluteal pressure ulcer Electronic Signature(s) Signed: 06/05/2019 9:18:28 AM By: Worthy Keeler PA-C Entered By: Worthy Keeler on 06/05/2019 09:18:28 [name] ([id number]) -------------------------------------------------------------------------------- HPI Details Patient Name: [name] Date of Service: 06/05/2019 9:15 AM Medical Record Number: [id number] Patient Account Number: [id number] Date of Birth/Sex: 11/19/1940 (78 y.o. M) Treating RN: Primary Care Provider: Elsie Stain Other Clinician: Referring Provider: Elsie Stain Treating Provider/Extender: Melburn Hake, Harryette Shuart Weeks in Treatment: 0 History of Present Illness HPI Description: 05/30/2019 upon evaluation today patient appears for initial evaluation in our clinic concerning issues that he has been having on the left gluteal region due to a pressure ulcer unfortunately. He does have a lot of weakness as well as issues with hip pain that causes him to not be able to sleep in a bed that has been quite a problem for him unfortunately. 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 Nonetheless this has led to chronic pain in the right hip. The patient is slow-moving but is able to ambulate today. He does typically sleep in his lift chair. He does not really move around as much as he  probably should although he can and I think this is one area where I am going to suggest that we go ahead and have him get up and move around more frequently to try to help with offloading I discussed how this can be beneficial. The good news is his wounds do not seem to be too deep. No fever chills noted 06/05/2019 upon evaluation today patient's wound is showing some signs of slight improvement which is good news. With that being said there is unfortunately no evidence of significant overall improvement which is the one issue that we are seeing at this time with regard to how much she has been sitting. His family member tells me that he sat pretty much all day yesterday the patient states he got up once to go outside but again for the most part he does not seem to be moving around much at all. Electronic Signature(s) Signed: 06/05/2019 10:14:05 AM By: Worthy Keeler PA-C Entered By: Worthy Keeler on 06/05/2019 10:14:05 [name] ([id number]) -------------------------------------------------------------------------------- Physical Exam Details Patient Name: [name] Date of Service: 06/05/2019 9:15 AM Medical Record Number: [id number] Patient Account Number: [id number] Date of Birth/Sex: September 19, 1940 (78 y.o. M) Treating RN: Primary Care Provider: Elsie Stain Other Clinician: Referring Provider: Elsie Stain Treating Provider/Extender: STONE III, Tanya Crothers Weeks in Treatment: 0 Constitutional Well-nourished and well-hydrated in no acute distress. Respiratory normal breathing without difficulty. Psychiatric this patient is able to make decisions and demonstrates good insight into disease process. Alert and Oriented x 3. pleasant and cooperative. Notes Patient's wound bed currently showed signs of some slough on the surface of the wound which did require mechanical debridement which I performed that today without complication post debridement wound bed appears to be doing much  better. Electronic Signature(s) Signed: 06/05/2019 10:15:11 AM By: Worthy Keeler  PA-C Previous Signature: 06/05/2019 10:14:19 AM Version By: Worthy Keeler PA-C Entered By: Worthy Keeler on 06/05/2019 10:15:11 [name] ([id number]) -------------------------------------------------------------------------------- Physician Orders Details Patient Name: [name] Date of Service: 06/05/2019 9:15 AM Medical Record Number: [id number] Patient Account Number: [id number] Date of Birth/Sex: 1941-03-05 (78 y.o. M) Treating RN: Army Melia Primary Care Provider: Elsie Stain Other Clinician: Referring Provider: Elsie Stain Treating Provider/Extender: Melburn Hake, Josey Forcier Weeks in Treatment: 0 Verbal / Phone Orders: No Diagnosis Coding ICD-10 Coding Code Description [phone] Pressure ulcer of left buttock, stage 3 I10 Essential (primary) hypertension E11.622 Type 2 diabetes mellitus with other skin ulcer M62.81 Muscle weakness (generalized) Wound Cleansing Wound #1 Left Gluteus o Clean wound with Normal Saline. o Dial antibacterial soap, wash wounds, rinse and pat dry prior to dressing wounds o May Shower, gently pat wound dry prior to applying new dressing. Skin Barriers/Peri-Wound Care Wound #1 Left Gluteus o Skin Prep Primary Wound Dressing Wound #1 Left Gluteus o Silver Collagen Secondary Dressing Wound #1 Left Gluteus o Boardered Foam Dressing Dressing Change Frequency Wound #1 Left Gluteus o Change dressing every other day. - and as needed Follow-up Appointments o Return Appointment in 1 week. Off-Loading Wound #1 Left Gluteus o Turn and reposition every 2 hours Electronic Signature(s) Signed: 06/05/2019 4:58:25 PM By: Army Melia Signed: 06/06/2019 12:55:39 AM By: Worthy Keeler PA-C Entered By: Army Melia on 06/05/2019 09:55:26 [name]  ([id number]) -------------------------------------------------------------------------------- Problem List Details Patient Name: [name] Date of Service: 06/05/2019 9:15 AM Medical Record Number: [id number] Patient Account Number: [id number] Date of Birth/Sex: 01/23/1941 (78 y.o. 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, stage 3 05/30/2019 No Yes I10 Essential (primary) hypertension 05/30/2019 No Yes E11.622 Type 2 diabetes mellitus with other skin ulcer 05/30/2019 No Yes M62.81 Muscle weakness (generalized) 05/30/2019 No Yes Inactive Problems Resolved Problems Electronic Signature(s) Signed: 06/05/2019 9:18:23 AM By: Worthy Keeler PA-C Entered By: Worthy Keeler on 06/05/2019 09:18:22 [name] ([id number]) -------------------------------------------------------------------------------- Progress Note Details Patient Name: [name] Date of Service: 06/05/2019 9:15 AM Medical Record Number: [id number] Patient Account Number: [id number] Date of Birth/Sex: [birth year] (78 y.o. M) Treating RN: Primary Care Provider: Elsie Stain Other Clinician: Referring Provider: Elsie Stain Treating Provider/Extender: Melburn Hake, Vivia Rosenburg Weeks in Treatment: 0 Subjective Chief Complaint Information obtained from Patient Left gluteal pressure ulcer History of Present Illness (HPI) 05/30/2019 upon evaluation today patient appears for initial evaluation in our clinic concerning issues that he has been having on the left gluteal region due to a pressure ulcer unfortunately. He does have a lot of weakness as well as issues with hip pain that causes him to not be able to sleep in a bed that has been quite a problem for him unfortunately.  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 Nonetheless this has led to chronic pain in the right hip. The patient is slow-moving but is able to ambulate today. He does typically sleep in his lift chair. He does not really move around as much as he probably should although he can and I think this is one area where I am going to suggest that  we go ahead and have him get up and move around more frequently to try to help with offloading I discussed how this can be beneficial. The good news is his wounds do not seem to be too deep. No fever chills noted 06/05/2019 upon evaluation today patient's wound is showing some signs of slight improvement which is good news. With that being said there is unfortunately no evidence of significant overall improvement which is the one issue that we are seeing at this time with regard to how much she has been sitting. His family member tells me that he sat pretty much all day yesterday the patient states he got up once to go outside but again for the most part he does not seem to be moving around much at all. Objective Constitutional Well-nourished and well-hydrated in no acute distress. Vitals Time Taken: 9:26 AM, Height: 72 in, Weight: 275 lbs, BMI: 37.3, Temperature: 97.8 F, Pulse: 79 bpm, Respiratory Rate: 16 breaths/min, Blood Pressure: 165/83 mmHg. Respiratory normal breathing without difficulty. Psychiatric this patient is able to make decisions and demonstrates good insight into disease process. Alert and Oriented x 3. pleasant and cooperative. General Notes: Patient's wound bed currently showed signs of some slough on the surface of the wound which did require mechanical debridement which I performed that today without complication post debridement wound bed appears to be doing much better. Integumentary  (Hair, Skin) Wound #1 status is Open. Original cause of wound was Pressure Injury. The wound is located on the Left Gluteus. The wound measures 0.9cm length x 0.8cm width x 0.1cm depth; 0.565cm^2 area and 0.057cm^3 volume. There is Fat Layer (Subcutaneous Tissue) Exposed exposed. There is no tunneling or undermining noted. There is a medium amount of serosanguineous drainage noted. The wound margin is flat and intact. There is medium (34-66%) pink granulation within the wound bed. There is a medium (34-66%) amount of necrotic tissue within the wound bed including Adherent Slough. [name], [name] ([id number]) Assessment Active Problems ICD-10 Pressure ulcer of left buttock, stage 3 Essential (primary) hypertension Type 2 diabetes mellitus with other skin ulcer Muscle weakness (generalized) Plan Wound Cleansing: Wound #1 Left Gluteus: Clean wound with Normal Saline. Dial antibacterial soap, wash wounds, rinse and pat dry prior to dressing wounds May Shower, gently pat wound dry prior to applying new dressing. Skin Barriers/Peri-Wound Care: Wound #1 Left Gluteus: Skin Prep Primary Wound Dressing: Wound #1 Left Gluteus: Silver Collagen Secondary Dressing: Wound #1 Left Gluteus: Boardered Foam Dressing Dressing Change Frequency: Wound #1 Left Gluteus: Change dressing every other day. - and as needed Follow-up Appointments: Return Appointment in 1 week. Off-Loading: Wound #1 Left Gluteus: Turn and reposition every 2 hours 1. My suggestion currently is can be that we continue with the current wound care measures with regard to the silver collagen I think that still the best thing to do. 2. We will also continue with a border foam dressing. 3. I am also going to recommend that the patient needs to get up at least for 5 to 10 minutes every single hour in order to move around to prevent continued pressure to the gluteal region. I think this will make a big improvement in his overall  symptoms. We will see patient back for reevaluation in 1 week here in the clinic. If anything worsens or changes patient will contact our office for additional recommendations. Electronic Signature(s) Signed: 06/05/2019 10:15:29 AM By: Worthy Keeler PA-C Entered By: Worthy Keeler on 06/05/2019 10:15:29 [name], Ronnie N. (  [id number]) -------------------------------------------------------------------------------- SuperBill Details Patient Name: [name] Date of Service: 06/05/2019 Medical Record Number: [id number] Patient Account Number: [id number] Date of Birth/Sex: [birth year] (78 y.o. M) Treating RN: Primary Care Provider: Elsie Stain Other Clinician: Referring Provider: Elsie Stain Treating Provider/Extender: Melburn Hake, Rondell Pardon Weeks in Treatment: 0 Diagnosis Coding ICD-10 Codes Code Description [phone] Pressure ulcer of left buttock, stage 3 I10 Essential (primary) hypertension E11.622 Type 2 diabetes mellitus with other skin ulcer M62.81 Muscle weakness (generalized) Facility Procedures CPT4 Code: [id number] Description: 99213 - WOUND CARE VISIT-LEV 3 EST PT Modifier: Quantity: 1 Physician Procedures CPT4 Code: [id number] Description: [id number] - WC PHYS LEVEL 3 - EST PT Modifier: Quantity: 1 CPT4 Code: Description: ICD-10 Diagnosis Description 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, stage 3 I10 Essential (primary) hypertension E11.622 Type 2 diabetes mellitus with other skin ulcer M62.81 Muscle weakness (generalized) Modifier: Quantity: Electronic Signature(s) Signed: 06/05/2019 10:15:42 AM By: Worthy Keeler PA-C Entered By: Worthy Keeler on 06/05/2019 10:15:42

## 2019-06-08 ENCOUNTER — Other Ambulatory Visit: Payer: Self-pay

## 2019-06-08 NOTE — Patient Outreach (Signed)
  [place] [facility]) Care Management Chronic Special Needs Program    06/08/2019  Name: [name], DOB: [birth year]  MRN: [id number]   [name] is enrolled in a chronic special needs plan for Diabetes. No HRA assessment received. RNCM called to assist client with completing health risk assessment and update care plan. No answer. HIPAA compliant message left.   Plan: Chronic care management coordinator will attempt outreach within 2-3 weeks.  Thea Silversmith, RN, MSN, [place] [phone]

## 2019-06-19 ENCOUNTER — Other Ambulatory Visit: Payer: Self-pay

## 2019-06-19 ENCOUNTER — Encounter: Payer: HMO | Admitting: Physician Assistant

## 2019-06-19 DIAGNOSIS — E1169 Type 2 diabetes mellitus with other specified complication: Secondary | ICD-10-CM | POA: Diagnosis not present

## 2019-06-19 DIAGNOSIS — M6281 Muscle weakness (generalized): Secondary | ICD-10-CM | POA: Diagnosis not present

## 2019-06-19 DIAGNOSIS — I10 Essential (primary) hypertension: Secondary | ICD-10-CM | POA: Diagnosis not present

## 2019-06-19 DIAGNOSIS — L89323 Pressure ulcer of left buttock, stage 3: Secondary | ICD-10-CM | POA: Diagnosis not present

## 2019-06-19 DIAGNOSIS — E11622 Type 2 diabetes mellitus with other skin ulcer: Secondary | ICD-10-CM | POA: Diagnosis not present

## 2019-06-19 NOTE — Progress Notes (Signed)
Ronnie, Beck ([id number]) Visit Report for 06/19/2019 Arrival Information Details Patient Name: [name], [name] Date of Service: 06/19/2019 10:30 AM Medical Record Number: [id number] Patient Account Number: [id number] Date of Birth/Sex: 04/01/1940 (78 y.o. M) Treating RN: Montey Hora Primary Care Kamil Hanigan: Elsie Stain Other Clinician: Referring Arliss Hepburn: Elsie Stain Treating Trey Gulbranson/Extender: Melburn Hake, HOYT Weeks in Treatment: 2 Visit Information History Since Last Visit Added or deleted any medications: No Patient Arrived: Ambulatory Any new allergies or adverse reactions: No Arrival Time: 10:05 Had a fall or experienced change in No Accompanied By: daughter activities of daily living that may affect Transfer Assistance: None risk of falls: Patient Identification Verified: Yes Signs or symptoms of abuse/neglect since last visito No Secondary Verification Process Completed: Yes Hospitalized since last visit: No Patient Has Alerts: Yes Implantable device outside of the clinic excluding No Patient Alerts: DMII cellular tissue based products placed in the center since last visit: Has Dressing in Place as Prescribed: Yes Pain Present Now: No Electronic Signature(s) Signed: 06/19/2019 4:17:09 PM By: Montey Hora Entered By: Montey Hora on 06/19/2019 10:06:20 [name] ([id number]) -------------------------------------------------------------------------------- Clinic Level of Care Assessment Details Patient Name: [name] Date of Service: 06/19/2019 10:30 AM Medical Record Number: [id number] Patient Account Number: [id number] Date of Birth/Sex: 28-Oct-1940 (78 y.o. M) Treating RN: Army Melia Primary Care Maurisha Mongeau: Elsie Stain Other Clinician: Referring Eldora Napp: Elsie Stain Treating Brieann Osinski/Extender: Melburn Hake, HOYT Weeks in Treatment: 2 Clinic Level of Care Assessment Items TOOL 4 Quantity Score []  - Use when only an EandM is  performed on FOLLOW-UP visit 0 ASSESSMENTS - Nursing Assessment / Reassessment X - Reassessment of Co-morbidities (includes updates in patient status) 1 10 X- 1 5 Reassessment of Adherence to Treatment Plan ASSESSMENTS - Wound and Skin Assessment / Reassessment X - Simple Wound Assessment / Reassessment - one wound 1 5 []  - 0 Complex Wound Assessment / Reassessment - multiple wounds []  - 0 Dermatologic / Skin Assessment (not related to wound area) ASSESSMENTS - Focused Assessment []  - Circumferential Edema Measurements - multi extremities 0 []  - 0 Nutritional Assessment / Counseling / Intervention []  - 0 Lower Extremity Assessment (monofilament, tuning fork, pulses) []  - 0 Peripheral Arterial Disease Assessment (using hand held doppler) ASSESSMENTS - Ostomy and/or Continence Assessment and Care []  - Incontinence Assessment and Management 0 []  - 0 Ostomy Care Assessment and Management (repouching, etc.) PROCESS - Coordination of Care X - Simple Patient / Family Education for ongoing care 1 15 []  - 0 Complex (extensive) Patient / Family Education for ongoing care X- 1 10 Staff obtains Programmer, systems, Records, Test Results / Process Orders []  - 0 Staff telephones HHA, Nursing Homes / Clarify orders / etc []  - 0 Routine Transfer to another Facility (non-emergent condition) []  - 0 Routine Hospital Admission (non-emergent condition) []  - 0 New Admissions / Biomedical engineer / Ordering NPWT, Apligraf, etc. []  - 0 Emergency Hospital Admission (emergent condition) X- 1 10 Simple Discharge Coordination []  - 0 Complex (extensive) Discharge Coordination PROCESS - Special Needs []  - Pediatric / Minor Patient Management 0 []  - 0 Isolation Patient Management []  - 0 Hearing / Language / Visual special needs []  - 0 Assessment of Community assistance (transportation, D/C planning, etc.) [place], Cora N. ([id number]) []  - 0 Additional assistance / Altered mentation X- 1  15 Support Surface(s) Assessment (bed, cushion, seat, etc.) INTERVENTIONS - Wound Cleansing / Measurement X - Simple Wound Cleansing - one wound 1 5 []  - 0 Complex Wound Cleansing -  multiple wounds X- 1 5 Wound Imaging (photographs - any number of wounds) []  - 0 Wound Tracing (instead of photographs) X- 1 5 Simple Wound Measurement - one wound []  - 0 Complex Wound Measurement - multiple wounds INTERVENTIONS - Wound Dressings []  - Small Wound Dressing one or multiple wounds 0 []  - 0 Medium Wound Dressing one or multiple wounds []  - 0 Large Wound Dressing one or multiple wounds []  - 0 Application of Medications - topical []  - 0 Application of Medications - injection INTERVENTIONS - Miscellaneous []  - External ear exam 0 []  - 0 Specimen Collection (cultures, biopsies, blood, body fluids, etc.) []  - 0 Specimen(s) / Culture(s) sent or taken to Lab for analysis []  - 0 Patient Transfer (multiple staff / Civil Service fast streamer / Similar devices) []  - 0 Simple Staple / Suture removal (25 or less) []  - 0 Complex Staple / Suture removal (26 or more) []  - 0 Hypo / Hyperglycemic Management (close monitor of Blood Glucose) []  - 0 Ankle / Brachial Index (ABI) - do not check if billed separately X- 1 5 Vital Signs Has the patient been seen at the hospital within the last three years: Yes Total Score: 90 Level Of Care: New/Established - Level 3 Electronic Signature(s) Signed: 06/19/2019 4:14:01 PM By: Army Melia Entered By: Army Melia on 06/19/2019 10:25:24 [name] ([id number]) -------------------------------------------------------------------------------- Encounter Discharge Information Details Patient Name: [name] Date of Service: 06/19/2019 10:30 AM Medical Record Number: [id number] Patient Account Number: [id number] Date of Birth/Sex: [birth year] (78 y.o. M) Treating RN: Army Melia Primary Care [name]: Elsie Stain Other Clinician: Referring Ariadne Rissmiller: Elsie Stain Treating [name]/Extender: Melburn Hake, HOYT Weeks in Treatment: 2 Encounter Discharge Information Items Discharge Condition: Stable Ambulatory Status: Ambulatory Discharge Destination: Home Transportation: Private Auto Accompanied By: daughter Schedule Follow-up Appointment: Yes Clinical Summary of Care: Electronic Signature(s) Signed: 06/19/2019 4:14:01 PM By: Army Melia Entered By: Army Melia on 06/19/2019 10:25:55 [name] ([id number]) -------------------------------------------------------------------------------- Lower Extremity Assessment Details Patient Name: [name] Date of Service: 06/19/2019 10:30 AM Medical Record Number: [id number] Patient Account Number: [id number] Date of Birth/Sex: 1940-08-11 (78 y.o. M) Treating RN: Montey Hora Primary Care Kino Dunsworth: Elsie Stain Other Clinician: Referring Pablo Mathurin: Elsie Stain Treating Shalev Helminiak/Extender: Melburn Hake, HOYT Weeks in Treatment: 2 Electronic Signature(s) Signed: 06/19/2019 4:17:09 PM By: Montey Hora Entered By: Montey Hora on 06/19/2019 10:06:41 [name] ([id number]) -------------------------------------------------------------------------------- Multi Wound Chart Details Patient Name: [name] Date of Service: 06/19/2019 10:30 AM Medical Record Number: [id number] Patient Account Number: [id number] Date of Birth/Sex: [birth year] (78 y.o. M) Treating RN: Army Melia Primary Care Ahmaya Ostermiller: Elsie Stain Other Clinician: Referring Orlandus Borowski: Elsie Stain Treating Evaleigh Mccamy/Extender: Melburn Hake, HOYT Weeks in Treatment: 2 Vital Signs Height(in): 72 Pulse(bpm): 87 Weight(lbs): 275 Blood Pressure(mmHg): 164/84 Body Mass Index(BMI): 37 Temperature(F): 98.9 Respiratory Rate(breaths/min): 18 Photos: [N/A:N/A] Wound Location: Left Gluteus N/A N/A Wounding Event: Pressure Injury N/A N/A Primary Etiology: Pressure Ulcer N/A N/A Comorbid History: Glaucoma,  Hypertension, Type II N/A N/A Diabetes, Gout Date Acquired: 01/27/2019 N/A N/A Weeks of Treatment: 2 N/A N/A Wound Status: Open N/A N/A Measurements L x W x D (cm) 0.1x0.1x0.1 N/A N/A Area (cm) : 0.008 N/A N/A Volume (cm) : 0.001 N/A N/A % Reduction in Area: 99.10% N/A N/A % Reduction in Volume: 98.80% N/A N/A Classification: Category/Stage III N/A N/A Exudate Amount: None Present N/A N/A Wound Margin: Flat and Intact N/A N/A Granulation Amount: None Present (0%) N/A N/A Necrotic Amount: None Present (0%) N/A  N/A Exposed Structures: Fascia: No N/A N/A Fat Layer (Subcutaneous Tissue) Exposed: No Tendon: No Muscle: No Joint: No Bone: No Limited to Skin Breakdown Epithelialization: Large (67-100%) N/A N/A Treatment Notes Electronic Signature(s) Signed: 06/19/2019 4:14:01 PM By: Army Melia Entered By: Army Melia on 06/19/2019 10:19:56 [name] ([id number]) -------------------------------------------------------------------------------- Multi-Disciplinary Care Plan Details Patient Name: [name] Date of Service: 06/19/2019 10:30 AM Medical Record Number: [id number] Patient Account Number: [id number] Date of Birth/Sex: Jul 29, 1940 (78 y.o. M) Treating RN: Army Melia Primary Care Jamecia Lerman: Elsie Stain Other Clinician: Referring Kaiden Pech: Elsie Stain Treating Alejah Aristizabal/Extender: Melburn Hake, HOYT Weeks in Treatment: 2 Active Inactive Electronic Signature(s) Signed: 06/19/2019 4:14:01 PM By: Army Melia Entered By: Army Melia on 06/19/2019 10:24:14 [name] ([id number]) -------------------------------------------------------------------------------- Pain Assessment Details Patient Name: [name] Date of Service: 06/19/2019 10:30 AM Medical Record Number: [id number] Patient Account Number: [id number] Date of Birth/Sex: December 16, 1940 (78 y.o. M) Treating RN: Montey Hora Primary Care Heriberto Stmartin: Elsie Stain Other Clinician: Referring  Ladana Chavero: Elsie Stain Treating Arlin Savona/Extender: Melburn Hake, HOYT Weeks in Treatment: 2 Active Problems Location of Pain Severity and Description of Pain Patient Has Paino Yes Site Locations Pain Location: Pain in Ulcers Pain Management and Medication Current Pain Management: Electronic Signature(s) Signed: 06/19/2019 4:17:09 PM By: Montey Hora Entered By: Montey Hora on 06/19/2019 10:06:33 [name] ([id number]) -------------------------------------------------------------------------------- Patient/Caregiver Education Details Patient Name: [name] Date of Service: 06/19/2019 10:30 AM Medical Record Number: [id number] Patient Account Number: [id number] Date of Birth/Gender: [birth year] (78 y.o. M) Treating RN: Army Melia Primary Care Physician: Elsie Stain Other Clinician: Referring Physician: Elsie Stain Treating Physician/Extender: Sharalyn Ink in Treatment: 2 Education Assessment Education Provided To: Patient Education Topics Provided Wound/Skin Impairment: Handouts: Caring for Your Ulcer Methods: Demonstration, Explain/Verbal Responses: State content correctly Electronic Signature(s) Signed: 06/19/2019 4:14:01 PM By: Army Melia Entered By: Army Melia on 06/19/2019 10:25:36 [name] ([id number]) -------------------------------------------------------------------------------- Wound Assessment Details Patient Name: [name] Date of Service: 06/19/2019 10:30 AM Medical Record Number: [id number] Patient Account Number: [id number] Date of Birth/Sex: January 29, 1941 (78 y.o. M) Treating RN: Army Melia Primary Care Fatih Stalvey: Elsie Stain Other Clinician: Referring Lawarence Meek: Elsie Stain Treating Timica Marcom/Extender: Melburn Hake, HOYT Weeks in Treatment: 2 Wound Status Wound Number: 1 Primary Etiology: Pressure Ulcer Wound Location: Left Gluteus Wound Status: Healed - Epithelialized Wounding Event: Pressure  Injury Comorbid History: Glaucoma, Hypertension, Type II Diabetes, Gout Date Acquired: 01/27/2019 Weeks Of Treatment: 2 Clustered Wound: No Photos Wound Measurements Length: (cm) 0 Width: (cm) 0 Depth: (cm) 0 Area: (cm) Volume: (cm) % Reduction in Area: 100% % Reduction in Volume: 100% Epithelialization: Large (67-100%) 0 Tunneling: No 0 Undermining: No Wound Description Classification: Category/Stage III Wound Margin: Flat and Intact Exudate Amount: None Present Foul Odor After Cleansing: No Slough/Fibrino No Wound Bed Granulation Amount: None Present (0%) Exposed Structure Necrotic Amount: None Present (0%) Fascia Exposed: No Fat Layer (Subcutaneous Tissue) Exposed: No Tendon Exposed: No Muscle Exposed: No Joint Exposed: No Bone Exposed: No Limited to Skin Breakdown Electronic Signature(s) Signed: 06/19/2019 4:14:01 PM By: Army Melia Entered By: Army Melia on 06/19/2019 10:22:33 [name] ([id number]) -------------------------------------------------------------------------------- [place] Details Patient Name: [name] Date of Service: 06/19/2019 10:30 AM Medical Record Number: [id number] Patient Account Number: [id number] Date of Birth/Sex: 07/24/1940 (78 y.o. M) Treating RN: Montey Hora Primary Care Reinhold Rickey: Elsie Stain Other Clinician: Referring Rasheena Talmadge: Elsie Stain Treating Ryland Tungate/Extender: STONE III, HOYT Weeks in Treatment: 2 Vital Signs Time Taken: 10:06 Temperature (F): 98.9  Height (in): 72 Pulse (bpm): 87 Weight (lbs): 275 Respiratory Rate (breaths/min): 18 Body Mass Index (BMI): 37.3 Blood Pressure (mmHg): 164/84 Reference Range: 80 - 120 mg / dl Electronic Signature(s) Signed: 06/19/2019 4:17:09 PM By: Montey Hora Entered By: Montey Hora on 06/19/2019 10:07:52

## 2019-06-19 NOTE — Progress Notes (Addendum)
Ronnie, Beck ([id number]) Visit Report for 06/19/2019 Chief Complaint Document Details Patient Name: [name], [name] Date of Service: 06/19/2019 10:30 AM Medical Record Number: [id number] Patient Account Number: [id number] Date of Birth/Sex: 11-16-1940 (78 y.o. M) Treating RN: Army Melia Primary Care Provider: Elsie Stain Other Clinician: Referring Provider: Elsie Stain Treating Provider/Extender: Melburn Hake, Irl Bodie Weeks in Treatment: 2 Information Obtained from: Patient Chief Complaint Left gluteal pressure ulcer Electronic Signature(s) Signed: 06/19/2019 10:17:22 AM By: Worthy Keeler PA-C Entered By: Worthy Keeler on 06/19/2019 10:17:21 [name] ([id number]) -------------------------------------------------------------------------------- HPI Details Patient Name: [name] Date of Service: 06/19/2019 10:30 AM Medical Record Number: [id number] Patient Account Number: [id number] Date of Birth/Sex: March 09, 1941 (78 y.o. M) Treating RN: Army Melia Primary Care Provider: Elsie Stain Other Clinician: Referring Provider: Elsie Stain Treating Provider/Extender: Melburn Hake, Braelynn Lupton Weeks in Treatment: 2 History of Present Illness HPI Description: 05/30/2019 upon evaluation today patient appears for initial evaluation in our clinic concerning issues that he has been having on the left gluteal region due to a pressure ulcer unfortunately. He does have a lot of weakness as well as issues with hip pain that causes him to not be able to sleep in a bed that has been quite a problem for him unfortunately. 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 Nonetheless this has led to chronic pain in the right hip. The patient is slow-moving but is able to ambulate today. He does typically sleep in his lift chair. He does not  really move around as much as he probably should although he can and I think this is one area where I am going to suggest that we go ahead and have him get up and move around more frequently to try to help with offloading I discussed how this can be beneficial. The good news is his wounds do not seem to be too deep. No fever chills noted 06/05/2019 upon evaluation today patient's wound is showing some signs of slight improvement which is good news. With that being said there is unfortunately no evidence of significant overall improvement which is the one issue that we are seeing at this time with regard to how much she has been sitting. His family member tells me that he sat pretty much all day yesterday the patient states he got up once to go outside but again for the most part he does not seem to be moving around much at all. 06/19/2019 upon evaluation today patient appears to be doing excellent in regard to his wound. In fact this has made great progress despite the fact that he apparently had food poisoning with diarrhea over the weekend when he was at the beach. This is indeed unfortunate and to be honest the only good thing about this is that I did discontinue doing the dressing and actually switched over to just using gentamicin cream applied to the wound bed followed by a good coating of Desitin which seems to have done excellent for him. There is no signs of active infection at this time. No fevers, chills, nausea, vomiting, or diarrhea. Electronic Signature(s) Signed: 06/19/2019 10:28:02 AM By: Worthy Keeler PA-C Entered By: Worthy Keeler on 06/19/2019 10:28:01 [name] ([id number]) -------------------------------------------------------------------------------- Physical Exam Details Patient Name: [name] Date of Service: 06/19/2019 10:30 AM Medical Record Number: [id number] Patient Account Number: [id number]  Date of Birth/Sex: 1940/11/19 (79 y.o. M) Treating RN:  Army Melia Primary Care Provider: Elsie Stain Other Clinician: Referring Provider: Elsie Stain Treating Provider/Extender: Melburn Hake, Damaris Abeln Weeks in Treatment: 2 Constitutional Obese and well-hydrated in no acute distress. Respiratory normal breathing without difficulty. Psychiatric this patient is able to make decisions and demonstrates good insight into disease process. Alert and Oriented x 3. pleasant and cooperative. Notes Patient's wound bed currently showed signs again of pretty much complete epithelization in fact there is only a small area that seems to be new skin but very fragile that still is remaining. With that being said it essentially is healed and we will just charge the patient from wound care services today. With that being said I am going to send in a refill for the gentamicin cream just to have on hand as they are still using that currently and about to run out of what they currently have. Electronic Signature(s) Signed: 06/19/2019 10:28:30 AM By: Worthy Keeler PA-C Entered By: Worthy Keeler on 06/19/2019 10:28:29 [name] ([id number]) -------------------------------------------------------------------------------- Physician Orders Details Patient Name: [name] Date of Service: 06/19/2019 10:30 AM Medical Record Number: [id number] Patient Account Number: [id number] Date of Birth/Sex: May 15, 1940 (78 y.o. M) Treating RN: Army Melia Primary Care Provider: Elsie Stain Other Clinician: Referring Provider: Elsie Stain Treating Provider/Extender: Melburn Hake, Daniya Aramburo Weeks in Treatment: 2 Verbal / Phone Orders: No Diagnosis Coding ICD-10 Coding Code Description [phone] Pressure ulcer of left buttock, stage 3 I10 Essential (primary) hypertension E11.622 Type 2 diabetes mellitus with other skin ulcer M62.81 Muscle weakness (generalized) Discharge From [facility] Services o Discharge from [place] complete Patient  Medications Allergies: morphine, codeine Notifications Medication Indication Start End gentamicin 06/19/2019 DOSE topical 0.1 % cream - cream topical applied to the wound bed daily as directed in the clinic Electronic Signature(s) Signed: 06/19/2019 10:29:30 AM By: Worthy Keeler PA-C Entered By: Worthy Keeler on 06/19/2019 10:29:29 [name] ([id number]) -------------------------------------------------------------------------------- Problem List Details Patient Name: [name] Date of Service: 06/19/2019 10:30 AM Medical Record Number: [id number] Patient Account Number: [id number] Date of Birth/Sex: 12/27/1940 (78 y.o. M) Treating RN: Army Melia Primary Care Provider: Elsie Stain Other Clinician: Referring Provider: Elsie Stain Treating Provider/Extender: Melburn Hake, Morghan Kester Weeks in Treatment: 2 Active Problems ICD-10 Evaluated Encounter Code Description Active Date Today Diagnosis 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, stage 3 05/30/2019 No Yes I10 Essential (primary) hypertension 05/30/2019 No Yes E11.622 Type 2 diabetes mellitus with other skin ulcer 05/30/2019 No Yes M62.81 Muscle weakness (generalized) 05/30/2019 No Yes Inactive Problems Resolved Problems Electronic Signature(s) Signed: 06/19/2019 10:17:16 AM By: Worthy Keeler PA-C Entered By: Worthy Keeler on 06/19/2019 10:17:16 [name] ([id number]) -------------------------------------------------------------------------------- Progress Note Details Patient Name: [name] Date of Service: 06/19/2019 10:30 AM Medical Record Number: [id number] Patient Account Number: [id number] Date of Birth/Sex: 09-May-1940 (78 y.o. M) Treating RN: Army Melia Primary Care Provider: Elsie Stain Other Clinician: Referring Provider: Elsie Stain Treating Provider/Extender: Melburn Hake, Chanda Laperle Weeks in Treatment: 2 Subjective Chief Complaint Information obtained from Patient Left gluteal pressure  ulcer History of Present Illness (HPI) 05/30/2019 upon evaluation today patient appears for initial evaluation in our clinic concerning issues that he has been having on the left gluteal region due to a pressure ulcer unfortunately. He does have a lot of weakness as well as issues with hip pain that causes him to not be able to sleep in a bed that has been  quite a problem for him unfortunately. 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 Nonetheless this has led to chronic pain in the right hip. The patient is slow-moving but is able to ambulate today. He does typically sleep in his lift chair. He does not really move around as much as he probably should although he can and I think this is one area where I am going to suggest that we go ahead and have him get up and move around more frequently to try to help with offloading I discussed how this can be beneficial. The good news is his wounds do not seem to be too deep. No fever chills noted 06/05/2019 upon evaluation today patient's wound is showing some signs of slight improvement which is good news. With that being said there is unfortunately no evidence of significant overall improvement which is the one issue that we are seeing at this time with regard to how much she has been sitting. His family member tells me that he sat pretty much all day yesterday the patient states he got up once to go outside but again for the most part he does not seem to be moving around much at all. 06/19/2019 upon evaluation today patient appears to be doing excellent in regard to his wound. In fact this has made great progress despite the fact that he apparently had food poisoning with diarrhea over the weekend when he was at the beach. This is indeed unfortunate and to be honest the only good thing about this is that I did  discontinue doing the dressing and actually switched over to just using gentamicin cream applied to the wound bed followed by a good coating of Desitin which seems to have done excellent for him. There is no signs of active infection at this time. No fevers, chills, nausea, vomiting, or diarrhea. Objective Constitutional Obese and well-hydrated in no acute distress. Vitals Time Taken: 10:06 AM, Height: 72 in, Weight: 275 lbs, BMI: 37.3, Temperature: 98.9 F, Pulse: 87 bpm, Respiratory Rate: 18 breaths/min, Blood Pressure: 164/84 mmHg. Respiratory normal breathing without difficulty. Psychiatric this patient is able to make decisions and demonstrates good insight into disease process. Alert and Oriented x 3. pleasant and cooperative. General Notes: Patient's wound bed currently showed signs again of pretty much complete epithelization in fact there is only a small area that seems to be new skin but very fragile that still is remaining. With that being said it essentially is healed and we will just charge the patient from wound care services today. With that being said I am going to send in a refill for the gentamicin cream just to have on hand as they are still using that currently and about to run out of what they currently have. Integumentary (Hair, Skin) Wound #1 status is Healed - Epithelialized. Original cause of wound was Pressure Injury. The wound is located on the Left Gluteus. The wound measures 0cm length x 0cm width x 0cm depth; 0cm^2 area and 0cm^3 volume. The wound is limited to skin breakdown. There is no tunneling or Rayson, Jaxxon N. ([id number]) undermining noted. There is a none present amount of drainage noted. The wound margin is flat and intact. There is no granulation within the wound bed. There is no necrotic tissue within the wound bed. Assessment Active Problems ICD-10 Pressure ulcer of left buttock, stage  3 Essential (primary) hypertension Type 2 diabetes mellitus  with other skin ulcer Muscle weakness (generalized) Plan Discharge From [facility] Services: Discharge from [place] complete The following medication(s) was prescribed: gentamicin topical 0.1 % cream cream topical applied to the wound bed daily as directed in the clinic starting 06/19/2019 1. I would recommend currently that the patient continue with the Desitin along with the gentamicin cream. I did send a refill for the gentamicin cream today. 2. They will continue to coat over top of this with the Desitin which seems to have done extremely well for the patient today as well. 3. I also encouraged him to continue to appropriately offload including not sitting for long periods of time which will hopefully help the area to continue to stay healed without causing any problems or complications. We will see the patient back for follow-up visit as needed. Electronic Signature(s) Signed: 06/19/2019 10:30:09 AM By: Worthy Keeler PA-C Entered By: Worthy Keeler on 06/19/2019 10:30:08 [name] ([id number]) -------------------------------------------------------------------------------- SuperBill Details Patient Name: [name] Date of Service: 06/19/2019 Medical Record Number: [id number] Patient Account Number: [id number] Date of Birth/Sex: 02/24/1941 (78 y.o. M) Treating RN: Army Melia Primary Care Provider: Elsie Stain Other Clinician: Referring Provider: Elsie Stain Treating Provider/Extender: Melburn Hake, Mahika Vanvoorhis Weeks in Treatment: 2 Diagnosis Coding ICD-10 Codes Code Description [phone] Pressure ulcer of left buttock, stage 3 I10 Essential (primary) hypertension E11.622 Type 2 diabetes mellitus with other skin ulcer M62.81 Muscle weakness (generalized) Facility Procedures CPT4 Code: [id number] Description: 99213 - WOUND CARE VISIT-LEV 3 EST PT Modifier: Quantity: 1 Physician Procedures CPT4 Code: [id number] Description: [id number] - WC PHYS LEVEL 3 - EST  PT Modifier: Quantity: 1 CPT4 Code: Description: ICD-10 Diagnosis Description 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, stage 3 I10 Essential (primary) hypertension E11.622 Type 2 diabetes mellitus with other skin ulcer M62.81 Muscle weakness (generalized) Modifier: Quantity: Electronic Signature(s) Signed: 06/19/2019 10:30:25 AM By: Worthy Keeler PA-C Entered By: Worthy Keeler on 06/19/2019 10:30:24

## 2019-06-21 ENCOUNTER — Encounter: Payer: Self-pay | Admitting: Podiatry

## 2019-06-21 ENCOUNTER — Ambulatory Visit (INDEPENDENT_AMBULATORY_CARE_PROVIDER_SITE_OTHER): Payer: HMO | Admitting: Podiatry

## 2019-06-21 ENCOUNTER — Other Ambulatory Visit: Payer: Self-pay

## 2019-06-21 DIAGNOSIS — E1149 Type 2 diabetes mellitus with other diabetic neurological complication: Secondary | ICD-10-CM | POA: Diagnosis not present

## 2019-06-21 DIAGNOSIS — E1140 Type 2 diabetes mellitus with diabetic neuropathy, unspecified: Secondary | ICD-10-CM

## 2019-06-21 DIAGNOSIS — L84 Corns and callosities: Secondary | ICD-10-CM | POA: Diagnosis not present

## 2019-06-21 DIAGNOSIS — M779 Enthesopathy, unspecified: Secondary | ICD-10-CM | POA: Diagnosis not present

## 2019-06-21 NOTE — Progress Notes (Signed)
Subjective:   Patient ID: [name], male   DOB: 79 y.o.   MRN: [id number]   HPI Patient presents with pain in the inside the left foot around the first MPJ stating its been sore with fluid buildup and keratotic lesion formation   ROS      Objective:  Physical Exam  Inflammatory capsulitis first MPJ left with pain around the joint surface and long-term history of diabetes with insulin with diminished sharp dull vibratory bilateral     Assessment:  Inflammatory capsulitis first MPJ left keratotic lesion formation with history of long-term diabetes and has done well with diabetic shoes     Plan:  H&P reviewed all conditions recommended new diabetic shoes at this time and patient will be casted for these as he has done well in the past and is at risk with chronic lesion formation.  Sterile prep injected around the first MPJ 3 mg Dexasone Kenalog debrided the lesion and applied padding around the area along with sterile dressing

## 2019-06-22 ENCOUNTER — Other Ambulatory Visit: Payer: Self-pay

## 2019-06-22 NOTE — Patient Outreach (Signed)
[facility] [facility]) Care Management Chronic Special Needs Program  06/22/2019  Name: [name] DOB: 02/05/1941  MRN: [id number]  [name] is enrolled in a chronic special needs plan for  Diabetes. A completed health risk assessment has not been received from the client and client has not responded to outreach attempts.  RNCM called to assist client with completing health risk assessment and updating care plan. No answer. This is the 3rd outreach attempt.  The client's individualized care plan was developed based on available data.   Goals Addressed            This Visit's Progress   . COMPLETED: Client will use Assistive Devices as needed and verbalize understanding of device use       Obtain new Freestyle libre-   Did not speak with client: per chart Freestyle libre obtained 12/2018. Client is following up scheduled visits and in communication with provider. No notation of client with difficulty of use of meter.    . Client will verbalize knowledge of self management of Hypertension as evidences by BP reading of 140/90 or less; or as defined by provider   No change    Blood pressures per chart  162/84 and 150/84.  Goal renewed 2021 Follow up with your doctor as scheduled. Take your medications as prescribed by your doctor. Ask your doctor "what is my target blood pressure range". Monitor your blood pressure and take results to your doctor's appointment.  Monitor the amount of salt you are eating. Continue to exercise as tolerated and remain active. Eat heart healthy diet (full of fruits, vegetables, whole grains, lean protein, water--limit salt, fat, and sugar/simple carb intake) and increase physical activity as tolerated. Mailed education: "High blood pressure in adults". Please review and call if you have any questions.     . Client will verbalize understanding of treatment plan for impaired skin integrity and follow up with provider as scheduled.    On track    Please attend provider visits and wound care clinic visits as scheduled and follow provider recommendations. Mailed education: "Pressure sores". Please review and call if you have any questions.    [name] HEMOGLOBIN A1C < 7.0       A1C 04/13/2019 10.3 A1C 01/10/2019 8.4 A1C 07/15/2018  9.6  Diabetes self management actions:  Glucose monitoring per provider recommendations  Perform Quality checks on blood meter  Eat Healthy  Visit provider every 3-6 months as directed  Hbg A1C level every 3-6 months. Ask your doctor, "what is my Target A1C goal?" Ask your doctor, "what is my Target blood sugar range?"    . Increase physical activity   Not on track    Renew goal 2021 Mailed education: "exercise and aging". Please review and call if you have any questions.     . Maintain timely refills of diabetic medication as prescribed within the year .   On track    It is important to follow up with your provider as scheduled for recommended yearly labs, procedures and medication refills. Please call your doctor and/or your CSNP care coordinator [phone]) if you are having difficulty obtaining medications.    . Obtain annual  Lipid Profile, LDL-C   On track    It is important to follow up with your provider as scheduled for recommended labs.    . Obtain Annual Eye (retinal)  Exam    On track    It is important to follow up with your provider as  scheduled for recommended yearly exam.    . Obtain Annual Foot Exam   On track    It is important to follow up with your provider as scheduled for recommended yearly exam.    . Obtain annual screen for micro albuminuria (urine) , nephropathy (kidney problems)   On track    Unable to determine:  Goal renewed 2021 It is important to follow up with your provider as scheduled for recommended labs.  This lab looks at how your kidneys are working.    . Obtain Hemoglobin A1C at least 2 times per year   On track    It is important for you to  follow up with your provider for recommended labs.    . Patient stated goal: Weight Loss (pt-stated)   On track    Unable to reach client Continue goal 2021 Stay as active as possible. Follow provider recommendations. Mailed education: "diabetic meal planning". Please review and call if you have any questions.       . Visit Primary Care Provider or Endocrinologist at least 2 times per year    On track    It is important to follow up with your provider as scheduled for recommended labs, procedures, medication refills. If you do not have an annual wellness exam or have one scheduled, Please call your primary care provider to schedule.      Plan: . Send unsuccessful outreach letter with a copy of individualized care plan to client . Send individualized care plan to provider . Send education . RNCM will increase to tier 2. Per chart pressure ulcer Left gluteal-managed by wound clinic.  Chronic care management coordinator will attempt outreach per tier level within 6 months.    Thea Silversmith, RN, MSN, [place] [phone]

## 2019-07-04 ENCOUNTER — Ambulatory Visit: Payer: Self-pay

## 2019-07-05 ENCOUNTER — Other Ambulatory Visit: Payer: Self-pay | Admitting: Family Medicine

## 2019-07-05 DIAGNOSIS — E1169 Type 2 diabetes mellitus with other specified complication: Secondary | ICD-10-CM

## 2019-07-05 DIAGNOSIS — E790 Hyperuricemia without signs of inflammatory arthritis and tophaceous disease: Secondary | ICD-10-CM

## 2019-07-05 DIAGNOSIS — Z125 Encounter for screening for malignant neoplasm of prostate: Secondary | ICD-10-CM

## 2019-07-05 DIAGNOSIS — E669 Obesity, unspecified: Secondary | ICD-10-CM

## 2019-07-17 ENCOUNTER — Other Ambulatory Visit: Payer: Self-pay

## 2019-07-17 ENCOUNTER — Other Ambulatory Visit (INDEPENDENT_AMBULATORY_CARE_PROVIDER_SITE_OTHER): Payer: HMO

## 2019-07-17 ENCOUNTER — Ambulatory Visit: Payer: HMO

## 2019-07-17 ENCOUNTER — Telehealth: Payer: Self-pay

## 2019-07-17 DIAGNOSIS — E669 Obesity, unspecified: Secondary | ICD-10-CM | POA: Diagnosis not present

## 2019-07-17 DIAGNOSIS — E790 Hyperuricemia without signs of inflammatory arthritis and tophaceous disease: Secondary | ICD-10-CM

## 2019-07-17 DIAGNOSIS — Z125 Encounter for screening for malignant neoplasm of prostate: Secondary | ICD-10-CM

## 2019-07-17 DIAGNOSIS — E1169 Type 2 diabetes mellitus with other specified complication: Secondary | ICD-10-CM

## 2019-07-17 LAB — COMPREHENSIVE METABOLIC PANEL
ALT: 33 U/L (ref 0–53)
AST: 38 U/L — ABNORMAL HIGH (ref 0–37)
Albumin: 3.9 g/dL (ref 3.5–5.2)
Alkaline Phosphatase: 37 U/L — ABNORMAL LOW (ref 39–117)
BUN: 22 mg/dL (ref 6–23)
CO2: 27 mEq/L (ref 19–32)
Calcium: 9.2 mg/dL (ref 8.4–10.5)
Chloride: 102 mEq/L (ref 96–112)
Creatinine, Ser: 1.1 mg/dL (ref 0.40–1.50)
GFR: 64.68 mL/min (ref >60.00)
Glucose, Bld: 159 mg/dL — ABNORMAL HIGH (ref 70–99)
Potassium: 3.8 mEq/L (ref 3.5–5.1)
Sodium: 138 mEq/L (ref 135–145)
Total Bilirubin: 0.6 mg/dL (ref 0.2–1.2)
Total Protein: 7 g/dL (ref 6.0–8.3)

## 2019-07-17 LAB — HEMOGLOBIN A1C: Hgb A1c MFr Bld: 9.9 % — ABNORMAL HIGH (ref 4.6–6.5)

## 2019-07-17 LAB — LIPID PANEL
Cholesterol: 179 mg/dL (ref 0–200)
HDL: 37.7 mg/dL — ABNORMAL LOW (ref >39.00)
Total CHOL/HDL Ratio: 5
Triglycerides: 484 mg/dL — ABNORMAL HIGH (ref 0.0–149.0)

## 2019-07-17 LAB — LDL CHOLESTEROL, DIRECT: Direct LDL: 57 mg/dL

## 2019-07-17 LAB — PSA, MEDICARE: PSA: 0.37 ng/ml (ref 0.10–4.00)

## 2019-07-17 LAB — URIC ACID: Uric Acid, Serum: 5.2 mg/dL (ref 4.0–7.8)

## 2019-07-17 NOTE — Telephone Encounter (Signed)
Noted. Thanks.

## 2019-07-17 NOTE — Telephone Encounter (Signed)
FYI- Called patient 3 times trying to complete his Medicare Wellness visit. Patient never answered. Left message on last call notifying patient appointment will be cancelled and he can call back to reschedule if he would like.

## 2019-07-20 ENCOUNTER — Encounter: Payer: Self-pay | Admitting: Family Medicine

## 2019-07-20 ENCOUNTER — Ambulatory Visit (INDEPENDENT_AMBULATORY_CARE_PROVIDER_SITE_OTHER): Payer: HMO | Admitting: Family Medicine

## 2019-07-20 ENCOUNTER — Other Ambulatory Visit: Payer: Self-pay

## 2019-07-20 DIAGNOSIS — Z0000 Encounter for general adult medical examination without abnormal findings: Secondary | ICD-10-CM | POA: Diagnosis not present

## 2019-07-20 DIAGNOSIS — E1169 Type 2 diabetes mellitus with other specified complication: Secondary | ICD-10-CM | POA: Diagnosis not present

## 2019-07-20 DIAGNOSIS — E785 Hyperlipidemia, unspecified: Secondary | ICD-10-CM | POA: Diagnosis not present

## 2019-07-20 DIAGNOSIS — I10 Essential (primary) hypertension: Secondary | ICD-10-CM

## 2019-07-20 DIAGNOSIS — Z7189 Other specified counseling: Secondary | ICD-10-CM

## 2019-07-20 DIAGNOSIS — R399 Unspecified symptoms and signs involving the genitourinary system: Secondary | ICD-10-CM

## 2019-07-20 DIAGNOSIS — M1990 Unspecified osteoarthritis, unspecified site: Secondary | ICD-10-CM

## 2019-07-20 DIAGNOSIS — M109 Gout, unspecified: Secondary | ICD-10-CM

## 2019-07-20 DIAGNOSIS — E669 Obesity, unspecified: Secondary | ICD-10-CM | POA: Diagnosis not present

## 2019-07-20 MED ORDER — TRULICITY 1.5 MG/0.5ML ~~LOC~~ SOAJ: 1.5000 mg | SUBCUTANEOUS | 12 refills | Status: DC

## 2019-07-20 NOTE — Patient Instructions (Addendum)
Okay to stop the oxybutynin and see how you do.  Take care.  Glad to see you. Recheck A1c at a visit in about 3 months.  Increase trulicity to 1.5mg  a week.  You may need to taper your insulin if needed.  Update me as needed.  Take care.  Glad to see you.

## 2019-07-20 NOTE — Progress Notes (Signed)
This visit occurred during the SARS-CoV-2 public health emergency.  Safety protocols were in place, including screening questions prior to the visit, additional usage of staff PPE, and extensive cleaning of exam room while observing appropriate contact time as indicated for disinfecting solutions.  I have personally reviewed the Medicare Annual Wellness questionnaire and have noted 1. The patient's medical and social history 2. Their use of alcohol, tobacco or illicit drugs 3. Their current medications and supplements 4. The patient's functional ability including ADL's, fall risks, home safety risks and hearing or visual             impairment. 5. Diet and physical activities 6. Evidence for depression or mood disorders  The patients weight, height, BMI have been recorded in the chart and visual acuity is per eye clinic.  I have made referrals, counseling and provided education to the patient based review of the above and I have provided the pt with a written personalized care plan for preventive services.  Provider list updated- see scanned forms.  Routine anticipatory guidance given to patient.  See health maintenance. The possibility exists that previously documented standard health maintenance information may have been brought forward from a previous encounter into this note.  If needed, that same information has been updated to reflect the current situation based on today's encounter.    Flu up-to-date Shingles discussed with patient, Zostavax 2011. PNA up-to-date Tetanus 2014 Covid vaccine 2021 Colonoscopy 2017 Prostate cancer screening wnl 2021 Advance directive-wife designated patient were incapacitated Cognitive function addressed- see scanned forms- and if abnormal then additional documentation follows.   He had presumed food poisoning about 1 month ago after eating some seafood with GI upset and he is gradually getting back to baseline.    Diabetes:  Using medications without  difficulties: yes Hypoglycemic episodes: no Hyperglycemic episodes: no Feet problems: no Blood Sugars averaging: 150-200s in the AMs eye exam within last year: yes Still on insulin ([id number] units) and trulicity (0.75mg  weekly)  Gout.  Still on 1/2 tab of allopurinol. No gout flares.  Compliant.  Labs d/w pt.    Hypertension:    Using medication without problems or lightheadedness: yes Chest pain with exertion:no Edema: some occ BLE edema at the ankles, at baseline, not worse.   Short of breath: at baseline, not worse, only with sig exertion.   Still on CCB and ARB and metoprolol.    Elevated Cholesterol: Using medications without problems: yes Muscle aches:  Likely not from statin.  Diet compliance: yes Exercise: yes Still on statin and gemfibrozil.   He has had ortho f/u re: leg and hip pain.  Some days he has more pain than others.    Oxybutynin didn't help with urinary sx and he wanted to stop and see how he did.  D/w pt.    PMH and SH reviewed  Meds, vitals, and allergies reviewed.   ROS: Per HPI.  Unless specifically indicated otherwise in HPI, the patient denies:  General: fever. Eyes: acute vision changes ENT: sore throat Cardiovascular: chest pain Respiratory: SOB GI: vomiting GU: dysuria Musculoskeletal: acute back pain Derm: acute rash Neuro: acute motor dysfunction Psych: worsening mood Endocrine: polydipsia Heme: bleeding Allergy: hayfever  GEN: nad, alert and oriented HEENT: ncat NECK: supple w/o LA CV: rrr. PULM: ctab, no inc wob ABD: soft, +bs EXT: no edema SKIN: no acute rash  Health Maintenance  Topic Date Due  . COLONOSCOPY  12/30/2018  . OPHTHALMOLOGY EXAM  07/05/2019  . PNA vac Low  Risk Adult (2 of 2 - PPSV23) 03/30/2048 (Originally 03/27/2015)  . INFLUENZA VACCINE  10/29/2019  . HEMOGLOBIN A1C  01/16/2020  . FOOT EXAM  04/12/2020  . TETANUS/TDAP  04/04/2022  . COVID-19 Vaccine  Completed

## 2019-07-25 ENCOUNTER — Telehealth: Payer: Self-pay | Admitting: Family Medicine

## 2019-07-25 ENCOUNTER — Ambulatory Visit: Payer: Self-pay

## 2019-07-25 DIAGNOSIS — Z7189 Other specified counseling: Secondary | ICD-10-CM | POA: Insufficient documentation

## 2019-07-25 NOTE — Assessment & Plan Note (Signed)
Continue work on diet and exercise.  No change in statin and gemfibrozil.  He agrees.  We both doubt that his current aches are statin related.

## 2019-07-25 NOTE — Assessment & Plan Note (Signed)
Flu up-to-date Shingles discussed with patient, Zostavax 2011. PNA up-to-date Tetanus 2014 Covid vaccine 2021 Colonoscopy 2017 Prostate cancer screening wnl 2021 Advance directive-wife designated patient were incapacitated Cognitive function addressed- see scanned forms- and if abnormal then additional documentation follows.

## 2019-07-25 NOTE — Assessment & Plan Note (Signed)
He did not see much effect from oxybutynin and he wanted to stop in the meantime and see how he did.  I will update me as needed.

## 2019-07-25 NOTE — Assessment & Plan Note (Signed)
Discussed diet and exercise. Recheck A1c at a visit in about 3 months.  Increase trulicity to 1.5mg  a week.  He may need to taper your insulin if he is having lower sugars in the meantime.  He will update me as needed.  See after visit summary.

## 2019-07-25 NOTE — Telephone Encounter (Signed)
Left detailed message on voicemail. DPR 

## 2019-07-25 NOTE — Assessment & Plan Note (Signed)
Continue calcium channel blocker and ARB and metoprolol and update me as needed.  He agrees.

## 2019-07-25 NOTE — Assessment & Plan Note (Signed)
He has seen Ortho previously and he is putting up with his current level of joint discomfort.  He is taking meloxicam as needed.

## 2019-07-25 NOTE — Assessment & Plan Note (Signed)
Continue half tab of allopurinol.  No flares.  Compliant.  He agrees.

## 2019-07-25 NOTE — Assessment & Plan Note (Signed)
Advance directive-wife designated patient were incapacitated. 

## 2019-07-25 NOTE — Telephone Encounter (Signed)
Please call patient.  It does look like he is due for follow-up with GI for colonoscopy.  Please have him call the GI clinic.  Thanks.

## 2019-08-01 ENCOUNTER — Other Ambulatory Visit: Payer: Self-pay | Admitting: Pharmacy Technician

## 2019-08-01 NOTE — Patient Outreach (Signed)
[facility] [facility]) 08/01/2019  [name] Dec 21, 1940 [id number]    Follow up call placed to [facility] regarding patient assistance application(s) for Trulicity , Maudie Mercury confirms patient has been approved as of 3/1 until 03/29/2020. 4 month supply of medication delivered to patients home on 3/12 via [employer].  Follow up:  Left patient a HIPAA compliant voicemail to inform, will remove mysel from care team.  [name]. [name], [place] Certified Pharmacy Technician [employer]

## 2019-08-15 ENCOUNTER — Other Ambulatory Visit: Payer: Self-pay | Admitting: *Deleted

## 2019-08-15 MED ORDER — FREESTYLE LIBRE 14 DAY SENSOR MISC: 1.0000 | 3 refills | Status: DC

## 2019-08-15 MED ORDER — FREESTYLE LIBRE 14 DAY READER DEVI: 1.0000 | Freq: Every day | 0 refills | Status: DC

## 2019-08-28 ENCOUNTER — Other Ambulatory Visit: Payer: Self-pay | Admitting: Family Medicine

## 2019-08-29 ENCOUNTER — Encounter: Payer: Self-pay | Admitting: Family Medicine

## 2019-08-30 DIAGNOSIS — Z794 Long term (current) use of insulin: Secondary | ICD-10-CM | POA: Diagnosis not present

## 2019-08-30 DIAGNOSIS — E119 Type 2 diabetes mellitus without complications: Secondary | ICD-10-CM | POA: Diagnosis not present

## 2019-08-30 DIAGNOSIS — H04123 Dry eye syndrome of bilateral lacrimal glands: Secondary | ICD-10-CM | POA: Diagnosis not present

## 2019-08-30 DIAGNOSIS — Z961 Presence of intraocular lens: Secondary | ICD-10-CM | POA: Diagnosis not present

## 2019-08-30 DIAGNOSIS — H2511 Age-related nuclear cataract, right eye: Secondary | ICD-10-CM | POA: Diagnosis not present

## 2019-08-30 DIAGNOSIS — H401111 Primary open-angle glaucoma, right eye, mild stage: Secondary | ICD-10-CM | POA: Diagnosis not present

## 2019-08-30 DIAGNOSIS — H43813 Vitreous degeneration, bilateral: Secondary | ICD-10-CM | POA: Diagnosis not present

## 2019-08-30 DIAGNOSIS — H401123 Primary open-angle glaucoma, left eye, severe stage: Secondary | ICD-10-CM | POA: Diagnosis not present

## 2019-09-04 ENCOUNTER — Other Ambulatory Visit: Payer: Self-pay | Admitting: Family Medicine

## 2019-09-20 ENCOUNTER — Telehealth: Payer: Self-pay

## 2019-09-20 NOTE — Telephone Encounter (Signed)
Glad to see him back in clinic or with a virtual visit.  If we do either one of those it will count as a face-to-face and we can get him set up for home health PT.  If his sugar is not improving on the higher dose of Trulicity then I think it would make sense to get him in for an office visit here at [facility] if at all possible so we can do an A1c at the visit.  He would not need to come in fasting.  Thanks.

## 2019-09-20 NOTE — Telephone Encounter (Signed)
Mrs Stephanie Coup pts daughter left v/m that she is visiting pt from [place] and is a pharmacist and would like to come in with pt to discuss pts DM mgt and to optimize pts DM care and try to get the diabetes under control. Last A1c on 07/17/19 was 9.9. Last annual exam was 07/20/19. I spoke with pt who gave me permission to speak with his daughter Mrs Stephanie Coup from [place]. I spoke with Mrs Stephanie Coup and she is only here until end of Friday,09/22/19. Mrs Stephanie Coup said today pts BS was 248 but that was after eating some carbs. Mrs Stephanie Coup would appreciate a cb to see if Dr Damita Dunnings could schedule and in office or virtual appt to take with Mrs Stephanie Coup and her dad about his DM mgt. Mrs Stephanie Coup is wondering if could add on a short acting insulin to see if that would help get control of DM. Pt is also having incontinence issues and pt has appt on 06/25/21with urologist about that. Pt has fallen x 2 in last month and pt is not sure why; wonders if hip or knee could be bothering pt. Mrs Stephanie Coup is presently paying out of pocket once a wk for PT to come to pts home. Mrs Stephanie Coup wonders if Dr Damita Dunnings could order [facility] PT so medicare would help pay for PT. Mrs Stephanie Coup request cb on 09/21/19 after Dr Damita Dunnings has reviewed this note and pts chart.

## 2019-09-21 NOTE — Telephone Encounter (Signed)
Left detailed message on voicemail of patient and daughter.

## 2019-09-22 NOTE — Telephone Encounter (Signed)
Patient's daughter returned call.  Advised of Dr Josefine Class message She voiced understanding and scheduled an appointment to come in so his a1c could be checked

## 2019-09-24 NOTE — Telephone Encounter (Signed)
Thanks

## 2019-09-25 ENCOUNTER — Other Ambulatory Visit: Payer: Self-pay | Admitting: Family Medicine

## 2019-09-26 ENCOUNTER — Other Ambulatory Visit: Payer: Self-pay

## 2019-09-26 ENCOUNTER — Encounter: Payer: Self-pay | Admitting: Family Medicine

## 2019-09-26 ENCOUNTER — Ambulatory Visit (INDEPENDENT_AMBULATORY_CARE_PROVIDER_SITE_OTHER): Payer: HMO | Admitting: Family Medicine

## 2019-09-26 VITALS — BP 150/100 | HR 84 | Temp 97.7°F | Ht 72.0 in | Wt 266.1 lb

## 2019-09-26 DIAGNOSIS — E669 Obesity, unspecified: Secondary | ICD-10-CM

## 2019-09-26 DIAGNOSIS — Y92009 Unspecified place in unspecified non-institutional (private) residence as the place of occurrence of the external cause: Secondary | ICD-10-CM | POA: Diagnosis not present

## 2019-09-26 DIAGNOSIS — E119 Type 2 diabetes mellitus without complications: Secondary | ICD-10-CM

## 2019-09-26 DIAGNOSIS — E1169 Type 2 diabetes mellitus with other specified complication: Secondary | ICD-10-CM | POA: Diagnosis not present

## 2019-09-26 DIAGNOSIS — W19XXXA Unspecified fall, initial encounter: Secondary | ICD-10-CM | POA: Diagnosis not present

## 2019-09-26 DIAGNOSIS — R350 Frequency of micturition: Secondary | ICD-10-CM

## 2019-09-26 LAB — POC URINALSYSI DIPSTICK (AUTOMATED)
Bilirubin, UA: NEGATIVE
Blood, UA: NEGATIVE
Glucose, UA: NEGATIVE
Ketones, UA: NEGATIVE
Leukocytes, UA: NEGATIVE
Nitrite, UA: NEGATIVE
Protein, UA: POSITIVE — AB
Spec Grav, UA: >=1.03 — AB (ref 1.010–1.025)
Urobilinogen, UA: 0.2 E.U./dL
pH, UA: 6 (ref 5.0–8.0)

## 2019-09-26 LAB — POCT GLYCOSYLATED HEMOGLOBIN (HGB A1C): Hemoglobin A1C: 9.8 % — AB (ref 4.0–5.6)

## 2019-09-26 MED ORDER — TOUJEO MAX SOLOSTAR 300 UNIT/ML ~~LOC~~ SOPN: PEN_INJECTOR | SUBCUTANEOUS | Status: DC

## 2019-09-26 MED ORDER — TADALAFIL 2.5 MG PO TABS: 2.5000 mg | ORAL_TABLET | Freq: Every day | ORAL | 5 refills | Status: DC

## 2019-09-26 MED ORDER — DAPAGLIFLOZIN PROPANEDIOL 5 MG PO TABS: 5.0000 mg | ORAL_TABLET | Freq: Every day | ORAL | 5 refills | Status: DC

## 2019-09-26 NOTE — Progress Notes (Signed)
This visit occurred during the SARS-CoV-2 public health emergency.  Safety protocols were in place, including screening questions prior to the visit, additional usage of staff PPE, and extensive cleaning of exam room while observing appropriate contact time as indicated for disinfecting solutions.  Diabetes:  Using medications without difficulties: yes, split insulin dosing to 120 units in the morning and 80 in the evening. Hypoglycemic episodes: no Hyperglycemic episodes: Previously up to three hundreds, now down to around 200. Blood Sugars averaging: See above. eye exam within last year: yes Still on trulicity 1.5mg , 200mg  toujeo total per day. Previous increase in Trulicity did not make a significant difference in his sugars A1c 9.8, d/w pt at OV.  He is off oxybutynin. He has urinary urgency without burning with urination. We talked about options. See plan.  He fell about 1.5 weeks ago.  He missed the bed.  No syncope, no LOC.  3 falls in total.  He needed help to get up.    Her today with son in law, who is supportive and helpful. He is an attending physician and we talked about options for the patient's care, with permission of the patient. I appreciate the help of all involved.  Meds, vitals, and allergies reviewed.   ROS: Per HPI unless specifically indicated in ROS section   GEN: nad, alert and oriented HEENT: ncat NECK: supple w/o LA CV: rrr. PULM: ctab, no inc wob ABD: soft, +bs EXT: trace edema SKIN: no acute rash Back nontender to palpation in midline. He does not have CVA pain on palpation  See after visit summary.  At least 30 minutes were devoted to patient care in this encounter (this can potentially include time spent reviewing the patient's file/history, interviewing and examining the patient, counseling/reviewing plan with patient, ordering referrals, ordering tests, reviewing relevant laboratory or x-ray data, and documenting the encounter).

## 2019-09-26 NOTE — Patient Instructions (Addendum)
We'll call about setting up PT at home.  It may make sense to see about getting tread placed in the tub.  See about getting a fall button.    Add on cialis 2.5mg  a day.  See if you urinate better.  Let me know if you get lightheaded.  If no help, then let me know.    Assuming you are tolerating cialis, then add on farxiga daily.    Let me know about your sugar about 1 week after that.   Plan on recheck in about 3 months but update me as needed.    we'll see about potentially changing your lipid medicines later on.   Take care.  Glad to see you.

## 2019-09-27 DIAGNOSIS — W19XXXA Unspecified fall, initial encounter: Secondary | ICD-10-CM | POA: Insufficient documentation

## 2019-09-27 NOTE — Assessment & Plan Note (Signed)
He was not hypotensive or hypoglycemic and he did not have vertigo. I don't want to induce hypotension. We will try to get him set up for home health PT. I think that is likely a reasonable option at this point. Patient agrees. I gave him information about a fall button and he can call about that.

## 2019-09-27 NOTE — Assessment & Plan Note (Addendum)
We talked about options. I don't want to induce hypoglycemia or relative hypoglycemia. It likely makes sense to make gradual changes as we go along. We deferred mealtime insulin in favor of other changes, partly to limit his number of injections per day. He is already doing 2 injections of insulin a day. No change in insulin at this point.  Reasonable to add on Farxiga but it makes sense to treat his urinary symptoms first. We can add on Cialis to improve his urinary symptoms and then if he is tolerating that without hypotension he can add on Farxiga and then update me as needed. Routine cautions discussed with patient. He agrees to plan. See notes on urinalysis.  We also talked about his lipids. His triglycerides are elevated but his previous measured LDL was at goal. It likely makes sense to work on his diabetes control prior to trying to alter his lipid/triglyceride regimen (i.e. before potentially changing to fenofibrate from gemfibrozil). I would also like to limit the number of medication changes we made at one point. Patient agrees.

## 2019-10-01 DIAGNOSIS — I10 Essential (primary) hypertension: Secondary | ICD-10-CM | POA: Diagnosis not present

## 2019-10-01 DIAGNOSIS — Z794 Long term (current) use of insulin: Secondary | ICD-10-CM | POA: Diagnosis not present

## 2019-10-01 DIAGNOSIS — Z7982 Long term (current) use of aspirin: Secondary | ICD-10-CM | POA: Diagnosis not present

## 2019-10-01 DIAGNOSIS — G510 Bell's palsy: Secondary | ICD-10-CM | POA: Diagnosis not present

## 2019-10-01 DIAGNOSIS — E119 Type 2 diabetes mellitus without complications: Secondary | ICD-10-CM | POA: Diagnosis not present

## 2019-10-01 DIAGNOSIS — M48061 Spinal stenosis, lumbar region without neurogenic claudication: Secondary | ICD-10-CM | POA: Diagnosis not present

## 2019-10-01 DIAGNOSIS — M1711 Unilateral primary osteoarthritis, right knee: Secondary | ICD-10-CM | POA: Diagnosis not present

## 2019-10-01 DIAGNOSIS — E785 Hyperlipidemia, unspecified: Secondary | ICD-10-CM | POA: Diagnosis not present

## 2019-10-01 DIAGNOSIS — J309 Allergic rhinitis, unspecified: Secondary | ICD-10-CM | POA: Diagnosis not present

## 2019-10-01 DIAGNOSIS — M5416 Radiculopathy, lumbar region: Secondary | ICD-10-CM | POA: Diagnosis not present

## 2019-10-01 DIAGNOSIS — M109 Gout, unspecified: Secondary | ICD-10-CM | POA: Diagnosis not present

## 2019-10-01 DIAGNOSIS — R350 Frequency of micturition: Secondary | ICD-10-CM | POA: Diagnosis not present

## 2019-10-01 DIAGNOSIS — E669 Obesity, unspecified: Secondary | ICD-10-CM | POA: Diagnosis not present

## 2019-10-01 DIAGNOSIS — R32 Unspecified urinary incontinence: Secondary | ICD-10-CM | POA: Diagnosis not present

## 2019-10-01 DIAGNOSIS — K219 Gastro-esophageal reflux disease without esophagitis: Secondary | ICD-10-CM | POA: Diagnosis not present

## 2019-10-01 DIAGNOSIS — Z9181 History of falling: Secondary | ICD-10-CM | POA: Diagnosis not present

## 2019-10-06 ENCOUNTER — Telehealth: Payer: Self-pay

## 2019-10-06 NOTE — Telephone Encounter (Signed)
Prior auth started via cover my meds.   Key: [id number]

## 2019-10-09 ENCOUNTER — Other Ambulatory Visit: Payer: Self-pay | Admitting: Family Medicine

## 2019-10-09 DIAGNOSIS — J309 Allergic rhinitis, unspecified: Secondary | ICD-10-CM | POA: Diagnosis not present

## 2019-10-09 DIAGNOSIS — R350 Frequency of micturition: Secondary | ICD-10-CM | POA: Diagnosis not present

## 2019-10-09 DIAGNOSIS — M5416 Radiculopathy, lumbar region: Secondary | ICD-10-CM | POA: Diagnosis not present

## 2019-10-09 DIAGNOSIS — Z9181 History of falling: Secondary | ICD-10-CM | POA: Diagnosis not present

## 2019-10-09 DIAGNOSIS — R32 Unspecified urinary incontinence: Secondary | ICD-10-CM | POA: Diagnosis not present

## 2019-10-09 DIAGNOSIS — M48061 Spinal stenosis, lumbar region without neurogenic claudication: Secondary | ICD-10-CM | POA: Diagnosis not present

## 2019-10-09 DIAGNOSIS — Z7982 Long term (current) use of aspirin: Secondary | ICD-10-CM | POA: Diagnosis not present

## 2019-10-09 DIAGNOSIS — Z794 Long term (current) use of insulin: Secondary | ICD-10-CM | POA: Diagnosis not present

## 2019-10-09 DIAGNOSIS — E119 Type 2 diabetes mellitus without complications: Secondary | ICD-10-CM | POA: Diagnosis not present

## 2019-10-09 DIAGNOSIS — M109 Gout, unspecified: Secondary | ICD-10-CM | POA: Diagnosis not present

## 2019-10-09 DIAGNOSIS — I10 Essential (primary) hypertension: Secondary | ICD-10-CM | POA: Diagnosis not present

## 2019-10-09 DIAGNOSIS — E785 Hyperlipidemia, unspecified: Secondary | ICD-10-CM | POA: Diagnosis not present

## 2019-10-09 DIAGNOSIS — E669 Obesity, unspecified: Secondary | ICD-10-CM | POA: Diagnosis not present

## 2019-10-09 DIAGNOSIS — M1711 Unilateral primary osteoarthritis, right knee: Secondary | ICD-10-CM | POA: Diagnosis not present

## 2019-10-09 DIAGNOSIS — K219 Gastro-esophageal reflux disease without esophagitis: Secondary | ICD-10-CM | POA: Diagnosis not present

## 2019-10-09 DIAGNOSIS — G510 Bell's palsy: Secondary | ICD-10-CM | POA: Diagnosis not present

## 2019-10-10 NOTE — Telephone Encounter (Signed)
Reviewed chart and the last my chart message that I sent (10/06/2019) has not been read.   Left a detailed message for pt informing of same.

## 2019-10-10 NOTE — Telephone Encounter (Addendum)
Appeal for Tadalafil 2.5 mg tabs has been approved through 03-29-2020. Case [id number]

## 2019-10-11 NOTE — Telephone Encounter (Signed)
Noted. Thanks.

## 2019-10-12 ENCOUNTER — Telehealth: Payer: Self-pay | Admitting: *Deleted

## 2019-10-12 DIAGNOSIS — M1711 Unilateral primary osteoarthritis, right knee: Secondary | ICD-10-CM | POA: Diagnosis not present

## 2019-10-12 DIAGNOSIS — M48061 Spinal stenosis, lumbar region without neurogenic claudication: Secondary | ICD-10-CM | POA: Diagnosis not present

## 2019-10-12 DIAGNOSIS — E785 Hyperlipidemia, unspecified: Secondary | ICD-10-CM | POA: Diagnosis not present

## 2019-10-12 DIAGNOSIS — J309 Allergic rhinitis, unspecified: Secondary | ICD-10-CM | POA: Diagnosis not present

## 2019-10-12 DIAGNOSIS — G510 Bell's palsy: Secondary | ICD-10-CM | POA: Diagnosis not present

## 2019-10-12 DIAGNOSIS — Z9181 History of falling: Secondary | ICD-10-CM | POA: Diagnosis not present

## 2019-10-12 DIAGNOSIS — R350 Frequency of micturition: Secondary | ICD-10-CM | POA: Diagnosis not present

## 2019-10-12 DIAGNOSIS — R32 Unspecified urinary incontinence: Secondary | ICD-10-CM | POA: Diagnosis not present

## 2019-10-12 DIAGNOSIS — Z794 Long term (current) use of insulin: Secondary | ICD-10-CM | POA: Diagnosis not present

## 2019-10-12 DIAGNOSIS — M5416 Radiculopathy, lumbar region: Secondary | ICD-10-CM | POA: Diagnosis not present

## 2019-10-12 DIAGNOSIS — Z7982 Long term (current) use of aspirin: Secondary | ICD-10-CM | POA: Diagnosis not present

## 2019-10-12 DIAGNOSIS — E669 Obesity, unspecified: Secondary | ICD-10-CM | POA: Diagnosis not present

## 2019-10-12 DIAGNOSIS — I10 Essential (primary) hypertension: Secondary | ICD-10-CM | POA: Diagnosis not present

## 2019-10-12 DIAGNOSIS — K219 Gastro-esophageal reflux disease without esophagitis: Secondary | ICD-10-CM | POA: Diagnosis not present

## 2019-10-12 DIAGNOSIS — E119 Type 2 diabetes mellitus without complications: Secondary | ICD-10-CM | POA: Diagnosis not present

## 2019-10-12 DIAGNOSIS — M109 Gout, unspecified: Secondary | ICD-10-CM | POA: Diagnosis not present

## 2019-10-12 NOTE — Telephone Encounter (Signed)
PA for Tadalafil submitted thru CMM, response says available without authorization.

## 2019-10-16 DIAGNOSIS — Z7982 Long term (current) use of aspirin: Secondary | ICD-10-CM | POA: Diagnosis not present

## 2019-10-16 DIAGNOSIS — K219 Gastro-esophageal reflux disease without esophagitis: Secondary | ICD-10-CM | POA: Diagnosis not present

## 2019-10-16 DIAGNOSIS — E119 Type 2 diabetes mellitus without complications: Secondary | ICD-10-CM | POA: Diagnosis not present

## 2019-10-16 DIAGNOSIS — R350 Frequency of micturition: Secondary | ICD-10-CM | POA: Diagnosis not present

## 2019-10-16 DIAGNOSIS — M48061 Spinal stenosis, lumbar region without neurogenic claudication: Secondary | ICD-10-CM | POA: Diagnosis not present

## 2019-10-16 DIAGNOSIS — M5416 Radiculopathy, lumbar region: Secondary | ICD-10-CM | POA: Diagnosis not present

## 2019-10-16 DIAGNOSIS — R32 Unspecified urinary incontinence: Secondary | ICD-10-CM | POA: Diagnosis not present

## 2019-10-16 DIAGNOSIS — I10 Essential (primary) hypertension: Secondary | ICD-10-CM | POA: Diagnosis not present

## 2019-10-16 DIAGNOSIS — J309 Allergic rhinitis, unspecified: Secondary | ICD-10-CM | POA: Diagnosis not present

## 2019-10-16 DIAGNOSIS — Z9181 History of falling: Secondary | ICD-10-CM | POA: Diagnosis not present

## 2019-10-16 DIAGNOSIS — G510 Bell's palsy: Secondary | ICD-10-CM | POA: Diagnosis not present

## 2019-10-16 DIAGNOSIS — M109 Gout, unspecified: Secondary | ICD-10-CM | POA: Diagnosis not present

## 2019-10-16 DIAGNOSIS — E785 Hyperlipidemia, unspecified: Secondary | ICD-10-CM | POA: Diagnosis not present

## 2019-10-16 DIAGNOSIS — E669 Obesity, unspecified: Secondary | ICD-10-CM | POA: Diagnosis not present

## 2019-10-16 DIAGNOSIS — M1711 Unilateral primary osteoarthritis, right knee: Secondary | ICD-10-CM | POA: Diagnosis not present

## 2019-10-16 DIAGNOSIS — Z794 Long term (current) use of insulin: Secondary | ICD-10-CM | POA: Diagnosis not present

## 2019-10-18 DIAGNOSIS — I10 Essential (primary) hypertension: Secondary | ICD-10-CM | POA: Diagnosis not present

## 2019-10-18 DIAGNOSIS — Z794 Long term (current) use of insulin: Secondary | ICD-10-CM | POA: Diagnosis not present

## 2019-10-18 DIAGNOSIS — R350 Frequency of micturition: Secondary | ICD-10-CM | POA: Diagnosis not present

## 2019-10-18 DIAGNOSIS — Z7982 Long term (current) use of aspirin: Secondary | ICD-10-CM | POA: Diagnosis not present

## 2019-10-18 DIAGNOSIS — J309 Allergic rhinitis, unspecified: Secondary | ICD-10-CM | POA: Diagnosis not present

## 2019-10-18 DIAGNOSIS — M48061 Spinal stenosis, lumbar region without neurogenic claudication: Secondary | ICD-10-CM | POA: Diagnosis not present

## 2019-10-18 DIAGNOSIS — E785 Hyperlipidemia, unspecified: Secondary | ICD-10-CM | POA: Diagnosis not present

## 2019-10-18 DIAGNOSIS — M5416 Radiculopathy, lumbar region: Secondary | ICD-10-CM | POA: Diagnosis not present

## 2019-10-18 DIAGNOSIS — M109 Gout, unspecified: Secondary | ICD-10-CM | POA: Diagnosis not present

## 2019-10-18 DIAGNOSIS — E119 Type 2 diabetes mellitus without complications: Secondary | ICD-10-CM | POA: Diagnosis not present

## 2019-10-18 DIAGNOSIS — E669 Obesity, unspecified: Secondary | ICD-10-CM | POA: Diagnosis not present

## 2019-10-18 DIAGNOSIS — G510 Bell's palsy: Secondary | ICD-10-CM | POA: Diagnosis not present

## 2019-10-18 DIAGNOSIS — M1711 Unilateral primary osteoarthritis, right knee: Secondary | ICD-10-CM | POA: Diagnosis not present

## 2019-10-18 DIAGNOSIS — K219 Gastro-esophageal reflux disease without esophagitis: Secondary | ICD-10-CM | POA: Diagnosis not present

## 2019-10-18 DIAGNOSIS — R32 Unspecified urinary incontinence: Secondary | ICD-10-CM | POA: Diagnosis not present

## 2019-10-18 DIAGNOSIS — Z9181 History of falling: Secondary | ICD-10-CM | POA: Diagnosis not present

## 2019-10-19 ENCOUNTER — Ambulatory Visit (INDEPENDENT_AMBULATORY_CARE_PROVIDER_SITE_OTHER): Payer: HMO | Admitting: Family Medicine

## 2019-10-19 ENCOUNTER — Ambulatory Visit (INDEPENDENT_AMBULATORY_CARE_PROVIDER_SITE_OTHER)
Admission: RE | Admit: 2019-10-19 | Discharge: 2019-10-19 | Disposition: A | Discharge status: Home / Self Care | Payer: HMO | Source: Ambulatory Visit | Attending: Family Medicine | Admitting: Family Medicine

## 2019-10-19 ENCOUNTER — Encounter: Payer: Self-pay | Admitting: Family Medicine

## 2019-10-19 ENCOUNTER — Ambulatory Visit: Payer: HMO | Admitting: Family Medicine

## 2019-10-19 ENCOUNTER — Other Ambulatory Visit: Payer: Self-pay

## 2019-10-19 VITALS — BP 144/90 | HR 108 | Temp 97.5°F | Ht 72.0 in | Wt 262.0 lb

## 2019-10-19 DIAGNOSIS — E1169 Type 2 diabetes mellitus with other specified complication: Secondary | ICD-10-CM | POA: Diagnosis not present

## 2019-10-19 DIAGNOSIS — E669 Obesity, unspecified: Secondary | ICD-10-CM | POA: Diagnosis not present

## 2019-10-19 DIAGNOSIS — N138 Other obstructive and reflux uropathy: Secondary | ICD-10-CM | POA: Diagnosis not present

## 2019-10-19 DIAGNOSIS — N401 Enlarged prostate with lower urinary tract symptoms: Secondary | ICD-10-CM | POA: Diagnosis not present

## 2019-10-19 DIAGNOSIS — M549 Dorsalgia, unspecified: Secondary | ICD-10-CM

## 2019-10-19 DIAGNOSIS — R238 Other skin changes: Secondary | ICD-10-CM

## 2019-10-19 DIAGNOSIS — E119 Type 2 diabetes mellitus without complications: Secondary | ICD-10-CM

## 2019-10-19 DIAGNOSIS — M545 Low back pain: Secondary | ICD-10-CM | POA: Diagnosis not present

## 2019-10-19 LAB — POCT GLYCOSYLATED HEMOGLOBIN (HGB A1C): Hemoglobin A1C: 9.7 % — AB (ref 4.0–5.6)

## 2019-10-19 MED ORDER — MELOXICAM 15 MG PO TABS: 7.5000 mg | ORAL_TABLET | Freq: Every day | ORAL | 5 refills | Status: DC | PRN

## 2019-10-19 NOTE — Progress Notes (Signed)
This visit occurred during the SARS-CoV-2 public health emergency.  Safety protocols were in place, including screening questions prior to the visit, additional usage of staff PPE, and extensive cleaning of exam room while observing appropriate contact time as indicated for disinfecting solutions.  Pain on R side of the back.  He is out of meloxicam.  He is taking tylenol and ibuprofen in the meantime.  He fell at home.  "I was going to set down on the bed and missed."  [place] on his back.  This was 09/19/19. No falls in the meantime.  He has been sleeping in his lift chair.  He has pain laying in the bed.  PT has been coming to the house.  He needs encouragement to do his home exercises.  Wife is supportive.    meloxicam prev helped.  Prev Cr is reasonable.  Cautions d/w pt.    We talked about his mood and seeing short term goals.    He is on farxiga now.  His sugar is lower with addition of med.  119 this AM.  Still on trulicity 1.5mg  per week.  Still on insulin taking 120 AM and 80 PM.  His A1c may lag his progress.  Discussed.  He was finally able to get cialis and start that, about 1 week ago.  He can't tell a change in urination yet.  We talked about not increasing cialis at this point to avoid lower BP.    Meds, vitals, and allergies reviewed.   ROS: Per HPI unless specifically indicated in ROS section   nad ncat Neck supple, no LA rrr ctab Abd soft, not ttp, normal bowel sounds Skin irritation on the gluteal crease with chapped area on the L buttock.   Trace BLE edema.   At least 40 minutes were devoted to patient care in this encounter (this can potentially include time spent reviewing the patient's file/history, interviewing and examining the patient, counseling/reviewing plan with patient, ordering referrals, ordering tests, reviewing relevant laboratory or x-ray data, and documenting the encounter).

## 2019-10-19 NOTE — Patient Instructions (Addendum)
Go to the lab on the way out.   If you have mychart we'll likely use that to update you.    Limit prolonged sitting and use a cushion when sitting.  Keep shifting your weight when sitting.  Keep working on your exercises.  Restart meloxicam and stop ibuprofen.  Take with food.  Update me as needed.   If your urination isn't better in about 1 week with 2.5mg  of cialis a day, then increase to 5mg  a day and let me know how that goes.     Take care.  Glad to see you. Plan on recheck in 3 months but update me in the meantime.

## 2019-10-22 DIAGNOSIS — M549 Dorsalgia, unspecified: Secondary | ICD-10-CM | POA: Insufficient documentation

## 2019-10-22 NOTE — Assessment & Plan Note (Signed)
A1c likely lags his progress.  Continue current medications for now.  Recheck periodically.  He agrees.

## 2019-10-22 NOTE — Assessment & Plan Note (Signed)
Continue Cialis 2.5 mg a day for now but if urination isn't better in about 1 week with 2.5mg  of cialis a day, then increase to 5mg  a day and let me know how that goes.   He agrees.

## 2019-10-22 NOTE — Assessment & Plan Note (Signed)
Discussed options Limit prolonged sitting and use a cushion when sitting.  Keep shifting your weight when sitting.  Keep working on your exercises so that he will be able to get up and move more and not have episodes of prolonged sitting.

## 2019-10-22 NOTE — Assessment & Plan Note (Addendum)
Discussed options. Restart meloxicam and stop ibuprofen.  Take with food.  Update me as needed.  He agrees with plan. See notes on imaging.

## 2019-10-23 DIAGNOSIS — E669 Obesity, unspecified: Secondary | ICD-10-CM | POA: Diagnosis not present

## 2019-10-23 DIAGNOSIS — K219 Gastro-esophageal reflux disease without esophagitis: Secondary | ICD-10-CM | POA: Diagnosis not present

## 2019-10-23 DIAGNOSIS — R32 Unspecified urinary incontinence: Secondary | ICD-10-CM | POA: Diagnosis not present

## 2019-10-23 DIAGNOSIS — M48061 Spinal stenosis, lumbar region without neurogenic claudication: Secondary | ICD-10-CM | POA: Diagnosis not present

## 2019-10-23 DIAGNOSIS — M109 Gout, unspecified: Secondary | ICD-10-CM | POA: Diagnosis not present

## 2019-10-23 DIAGNOSIS — R350 Frequency of micturition: Secondary | ICD-10-CM | POA: Diagnosis not present

## 2019-10-23 DIAGNOSIS — M1711 Unilateral primary osteoarthritis, right knee: Secondary | ICD-10-CM | POA: Diagnosis not present

## 2019-10-23 DIAGNOSIS — G510 Bell's palsy: Secondary | ICD-10-CM | POA: Diagnosis not present

## 2019-10-23 DIAGNOSIS — E785 Hyperlipidemia, unspecified: Secondary | ICD-10-CM | POA: Diagnosis not present

## 2019-10-23 DIAGNOSIS — Z9181 History of falling: Secondary | ICD-10-CM | POA: Diagnosis not present

## 2019-10-23 DIAGNOSIS — J309 Allergic rhinitis, unspecified: Secondary | ICD-10-CM | POA: Diagnosis not present

## 2019-10-23 DIAGNOSIS — I10 Essential (primary) hypertension: Secondary | ICD-10-CM | POA: Diagnosis not present

## 2019-10-23 DIAGNOSIS — M5416 Radiculopathy, lumbar region: Secondary | ICD-10-CM | POA: Diagnosis not present

## 2019-10-23 DIAGNOSIS — E119 Type 2 diabetes mellitus without complications: Secondary | ICD-10-CM | POA: Diagnosis not present

## 2019-10-23 DIAGNOSIS — Z7982 Long term (current) use of aspirin: Secondary | ICD-10-CM | POA: Diagnosis not present

## 2019-10-23 DIAGNOSIS — Z794 Long term (current) use of insulin: Secondary | ICD-10-CM | POA: Diagnosis not present

## 2019-10-23 LAB — POC URINALSYSI DIPSTICK (AUTOMATED)
Bilirubin, UA: NEGATIVE
Blood, UA: NEGATIVE
Glucose, UA: POSITIVE — AB
Ketones, UA: NEGATIVE
Leukocytes, UA: NEGATIVE
Nitrite, UA: NEGATIVE
Protein, UA: NEGATIVE
Spec Grav, UA: 1.02 (ref 1.010–1.025)
Urobilinogen, UA: 0.2 E.U./dL
pH, UA: 6 (ref 5.0–8.0)

## 2019-10-23 NOTE — Addendum Note (Signed)
Addended by: Josetta Huddle on: 10/23/2019 03:27 PM   Modules accepted: Orders

## 2019-10-25 DIAGNOSIS — Z794 Long term (current) use of insulin: Secondary | ICD-10-CM | POA: Diagnosis not present

## 2019-10-25 DIAGNOSIS — Z7982 Long term (current) use of aspirin: Secondary | ICD-10-CM | POA: Diagnosis not present

## 2019-10-25 DIAGNOSIS — M1711 Unilateral primary osteoarthritis, right knee: Secondary | ICD-10-CM | POA: Diagnosis not present

## 2019-10-25 DIAGNOSIS — K219 Gastro-esophageal reflux disease without esophagitis: Secondary | ICD-10-CM | POA: Diagnosis not present

## 2019-10-25 DIAGNOSIS — R350 Frequency of micturition: Secondary | ICD-10-CM | POA: Diagnosis not present

## 2019-10-25 DIAGNOSIS — J309 Allergic rhinitis, unspecified: Secondary | ICD-10-CM | POA: Diagnosis not present

## 2019-10-25 DIAGNOSIS — Z9181 History of falling: Secondary | ICD-10-CM | POA: Diagnosis not present

## 2019-10-25 DIAGNOSIS — E119 Type 2 diabetes mellitus without complications: Secondary | ICD-10-CM | POA: Diagnosis not present

## 2019-10-25 DIAGNOSIS — M109 Gout, unspecified: Secondary | ICD-10-CM | POA: Diagnosis not present

## 2019-10-25 DIAGNOSIS — M5416 Radiculopathy, lumbar region: Secondary | ICD-10-CM | POA: Diagnosis not present

## 2019-10-25 DIAGNOSIS — E669 Obesity, unspecified: Secondary | ICD-10-CM | POA: Diagnosis not present

## 2019-10-25 DIAGNOSIS — R32 Unspecified urinary incontinence: Secondary | ICD-10-CM | POA: Diagnosis not present

## 2019-10-25 DIAGNOSIS — I10 Essential (primary) hypertension: Secondary | ICD-10-CM | POA: Diagnosis not present

## 2019-10-25 DIAGNOSIS — E785 Hyperlipidemia, unspecified: Secondary | ICD-10-CM | POA: Diagnosis not present

## 2019-10-25 DIAGNOSIS — M48061 Spinal stenosis, lumbar region without neurogenic claudication: Secondary | ICD-10-CM | POA: Diagnosis not present

## 2019-10-25 DIAGNOSIS — G510 Bell's palsy: Secondary | ICD-10-CM | POA: Diagnosis not present

## 2019-10-30 DIAGNOSIS — M1711 Unilateral primary osteoarthritis, right knee: Secondary | ICD-10-CM | POA: Diagnosis not present

## 2019-10-30 DIAGNOSIS — K219 Gastro-esophageal reflux disease without esophagitis: Secondary | ICD-10-CM | POA: Diagnosis not present

## 2019-10-30 DIAGNOSIS — E669 Obesity, unspecified: Secondary | ICD-10-CM | POA: Diagnosis not present

## 2019-10-30 DIAGNOSIS — J309 Allergic rhinitis, unspecified: Secondary | ICD-10-CM | POA: Diagnosis not present

## 2019-10-30 DIAGNOSIS — I10 Essential (primary) hypertension: Secondary | ICD-10-CM | POA: Diagnosis not present

## 2019-10-30 DIAGNOSIS — M5416 Radiculopathy, lumbar region: Secondary | ICD-10-CM | POA: Diagnosis not present

## 2019-10-30 DIAGNOSIS — R32 Unspecified urinary incontinence: Secondary | ICD-10-CM | POA: Diagnosis not present

## 2019-10-30 DIAGNOSIS — E119 Type 2 diabetes mellitus without complications: Secondary | ICD-10-CM | POA: Diagnosis not present

## 2019-10-30 DIAGNOSIS — M109 Gout, unspecified: Secondary | ICD-10-CM | POA: Diagnosis not present

## 2019-10-30 DIAGNOSIS — M48061 Spinal stenosis, lumbar region without neurogenic claudication: Secondary | ICD-10-CM | POA: Diagnosis not present

## 2019-10-30 DIAGNOSIS — G510 Bell's palsy: Secondary | ICD-10-CM | POA: Diagnosis not present

## 2019-10-30 DIAGNOSIS — Z7982 Long term (current) use of aspirin: Secondary | ICD-10-CM | POA: Diagnosis not present

## 2019-10-30 DIAGNOSIS — Z794 Long term (current) use of insulin: Secondary | ICD-10-CM | POA: Diagnosis not present

## 2019-10-30 DIAGNOSIS — R350 Frequency of micturition: Secondary | ICD-10-CM | POA: Diagnosis not present

## 2019-10-30 DIAGNOSIS — E785 Hyperlipidemia, unspecified: Secondary | ICD-10-CM | POA: Diagnosis not present

## 2019-10-30 DIAGNOSIS — Z9181 History of falling: Secondary | ICD-10-CM | POA: Diagnosis not present

## 2019-11-07 ENCOUNTER — Telehealth: Payer: Self-pay

## 2019-11-07 NOTE — Telephone Encounter (Signed)
pts wife left v/m that pt was referred to wound care March 2021; pt is having more problems with pressure point where pt is sitting and wants to know if pt needs referral to see wound care again. I spoke with Freda Munro at [facility] and she said pt does not need a referral and pts wife can call for appt and then Freda Munro said she would call pt to set up appt. Per DPR left v/m for pts wife with above info. FYI to Dr Damita Dunnings.

## 2019-11-08 NOTE — Telephone Encounter (Signed)
Noted. Thanks.

## 2019-11-11 DIAGNOSIS — Z7982 Long term (current) use of aspirin: Secondary | ICD-10-CM | POA: Diagnosis not present

## 2019-11-11 DIAGNOSIS — K219 Gastro-esophageal reflux disease without esophagitis: Secondary | ICD-10-CM | POA: Diagnosis not present

## 2019-11-11 DIAGNOSIS — M1711 Unilateral primary osteoarthritis, right knee: Secondary | ICD-10-CM | POA: Diagnosis not present

## 2019-11-11 DIAGNOSIS — E669 Obesity, unspecified: Secondary | ICD-10-CM | POA: Diagnosis not present

## 2019-11-11 DIAGNOSIS — M5416 Radiculopathy, lumbar region: Secondary | ICD-10-CM | POA: Diagnosis not present

## 2019-11-11 DIAGNOSIS — M109 Gout, unspecified: Secondary | ICD-10-CM | POA: Diagnosis not present

## 2019-11-11 DIAGNOSIS — R350 Frequency of micturition: Secondary | ICD-10-CM | POA: Diagnosis not present

## 2019-11-11 DIAGNOSIS — Z794 Long term (current) use of insulin: Secondary | ICD-10-CM | POA: Diagnosis not present

## 2019-11-11 DIAGNOSIS — G510 Bell's palsy: Secondary | ICD-10-CM | POA: Diagnosis not present

## 2019-11-11 DIAGNOSIS — I10 Essential (primary) hypertension: Secondary | ICD-10-CM | POA: Diagnosis not present

## 2019-11-11 DIAGNOSIS — E119 Type 2 diabetes mellitus without complications: Secondary | ICD-10-CM | POA: Diagnosis not present

## 2019-11-11 DIAGNOSIS — J309 Allergic rhinitis, unspecified: Secondary | ICD-10-CM | POA: Diagnosis not present

## 2019-11-11 DIAGNOSIS — Z9181 History of falling: Secondary | ICD-10-CM | POA: Diagnosis not present

## 2019-11-11 DIAGNOSIS — M48061 Spinal stenosis, lumbar region without neurogenic claudication: Secondary | ICD-10-CM | POA: Diagnosis not present

## 2019-11-11 DIAGNOSIS — R32 Unspecified urinary incontinence: Secondary | ICD-10-CM | POA: Diagnosis not present

## 2019-11-11 DIAGNOSIS — E785 Hyperlipidemia, unspecified: Secondary | ICD-10-CM | POA: Diagnosis not present

## 2019-11-15 ENCOUNTER — Other Ambulatory Visit: Payer: Self-pay | Admitting: Family Medicine

## 2019-11-16 ENCOUNTER — Other Ambulatory Visit: Payer: Self-pay | Admitting: *Deleted

## 2019-11-16 MED ORDER — FREESTYLE LIBRE 14 DAY SENSOR MISC: 1.0000 | 3 refills | Status: DC

## 2019-11-16 NOTE — Telephone Encounter (Signed)
Left detailed message on voicemail for patient to return call.

## 2019-11-19 ENCOUNTER — Telehealth: Payer: Self-pay | Admitting: Family Medicine

## 2019-11-19 DIAGNOSIS — I779 Disorder of arteries and arterioles, unspecified: Secondary | ICD-10-CM

## 2019-11-19 NOTE — Telephone Encounter (Signed)
Call patient.  He is due for follow-up CTA to check his thoracic aorta.  It had been checked last year and he is due for follow-up.  He is going to need a bmet to check his kidney function prior to the CT because of contrast used in the scan.  Does he want to come in and get the labs drawn now and go ahead with the scan or does he want to wait and get his labs done at the next office visit at the end of September and then set up the scan?  Please let me know so I can make some plans.  How is his sugar running?  How is he doing overall?  Please let me know.

## 2019-11-20 NOTE — Telephone Encounter (Addendum)
Labs and CT ordered.  Please schedule the labs.  Thanks.

## 2019-11-20 NOTE — Addendum Note (Signed)
Addended by: Tonia Ghent on: 11/20/2019 01:27 PM   Modules accepted: Orders

## 2019-11-20 NOTE — Telephone Encounter (Signed)
Left detailed message on voicemail to call in to schedule lab work.

## 2019-11-20 NOTE — Telephone Encounter (Addendum)
Patient advised and would like to go ahead and get the CT done prior to the next OV.  Please order CT with labs prior.  Patient says his BS have been running up and down 90 to 200.  Overall he says he is feeling pretty good outside of falling.

## 2019-11-21 ENCOUNTER — Telehealth: Payer: Self-pay

## 2019-11-21 NOTE — Telephone Encounter (Signed)
I left VM for patient to return phone call in regards to scheduling CT scan.

## 2019-11-22 ENCOUNTER — Ambulatory Visit: Payer: HMO | Admitting: Podiatry

## 2019-11-22 ENCOUNTER — Encounter: Payer: Self-pay | Admitting: Podiatry

## 2019-11-22 ENCOUNTER — Other Ambulatory Visit: Payer: Self-pay

## 2019-11-22 ENCOUNTER — Ambulatory Visit: Payer: Self-pay | Admitting: Podiatry

## 2019-11-22 ENCOUNTER — Other Ambulatory Visit (INDEPENDENT_AMBULATORY_CARE_PROVIDER_SITE_OTHER): Payer: HMO

## 2019-11-22 DIAGNOSIS — E1149 Type 2 diabetes mellitus with other diabetic neurological complication: Secondary | ICD-10-CM | POA: Diagnosis not present

## 2019-11-22 DIAGNOSIS — L84 Corns and callosities: Secondary | ICD-10-CM | POA: Diagnosis not present

## 2019-11-22 DIAGNOSIS — E1140 Type 2 diabetes mellitus with diabetic neuropathy, unspecified: Secondary | ICD-10-CM | POA: Diagnosis not present

## 2019-11-22 DIAGNOSIS — I779 Disorder of arteries and arterioles, unspecified: Secondary | ICD-10-CM

## 2019-11-22 NOTE — Progress Notes (Signed)
Subjective:   Patient ID: [name], male   DOB: 79 y.o.   MRN: [id number]   HPI Long-term diabetic with chronic lesion subfifth metatarsal left foot that becomes painful and makes it hard for him to walk while he is still waiting for his diabetic shoes with the preulcerative callus   ROS      Objective:  Physical Exam  Diminished neurological status both sharp dull vibratory with diminished circulatory status and lesion formation subfifth metatarsal left keratotic with a preulcerative appearance but no breakdown of tissue     Assessment:  At risk diabetic with lesion formation that is painful     Plan:  Reviewed condition and discussed diabetic shoes we will get him into diabetic shoes and today sterile debridement of lesions accomplished no iatrogenic bleeding reappoint routine care

## 2019-11-23 LAB — BASIC METABOLIC PANEL
BUN: 29 mg/dL — ABNORMAL HIGH (ref 6–23)
CO2: 26 mEq/L (ref 19–32)
Calcium: 10 mg/dL (ref 8.4–10.5)
Chloride: 102 mEq/L (ref 96–112)
Creatinine, Ser: 1.34 mg/dL (ref 0.40–1.50)
GFR: 51.46 mL/min — ABNORMAL LOW (ref >60.00)
Glucose, Bld: 157 mg/dL — ABNORMAL HIGH (ref 70–99)
Potassium: 3.9 mEq/L (ref 3.5–5.1)
Sodium: 139 mEq/L (ref 135–145)

## 2019-11-24 DIAGNOSIS — M1711 Unilateral primary osteoarthritis, right knee: Secondary | ICD-10-CM | POA: Diagnosis not present

## 2019-11-24 DIAGNOSIS — Z7982 Long term (current) use of aspirin: Secondary | ICD-10-CM | POA: Diagnosis not present

## 2019-11-24 DIAGNOSIS — M5416 Radiculopathy, lumbar region: Secondary | ICD-10-CM | POA: Diagnosis not present

## 2019-11-24 DIAGNOSIS — J309 Allergic rhinitis, unspecified: Secondary | ICD-10-CM | POA: Diagnosis not present

## 2019-11-24 DIAGNOSIS — E119 Type 2 diabetes mellitus without complications: Secondary | ICD-10-CM | POA: Diagnosis not present

## 2019-11-24 DIAGNOSIS — Z9181 History of falling: Secondary | ICD-10-CM | POA: Diagnosis not present

## 2019-11-24 DIAGNOSIS — G510 Bell's palsy: Secondary | ICD-10-CM | POA: Diagnosis not present

## 2019-11-24 DIAGNOSIS — R350 Frequency of micturition: Secondary | ICD-10-CM | POA: Diagnosis not present

## 2019-11-24 DIAGNOSIS — R32 Unspecified urinary incontinence: Secondary | ICD-10-CM | POA: Diagnosis not present

## 2019-11-24 DIAGNOSIS — M48061 Spinal stenosis, lumbar region without neurogenic claudication: Secondary | ICD-10-CM | POA: Diagnosis not present

## 2019-11-24 DIAGNOSIS — K219 Gastro-esophageal reflux disease without esophagitis: Secondary | ICD-10-CM | POA: Diagnosis not present

## 2019-11-24 DIAGNOSIS — E785 Hyperlipidemia, unspecified: Secondary | ICD-10-CM | POA: Diagnosis not present

## 2019-11-24 DIAGNOSIS — M109 Gout, unspecified: Secondary | ICD-10-CM | POA: Diagnosis not present

## 2019-11-24 DIAGNOSIS — E669 Obesity, unspecified: Secondary | ICD-10-CM | POA: Diagnosis not present

## 2019-11-24 DIAGNOSIS — Z794 Long term (current) use of insulin: Secondary | ICD-10-CM | POA: Diagnosis not present

## 2019-11-24 DIAGNOSIS — I10 Essential (primary) hypertension: Secondary | ICD-10-CM | POA: Diagnosis not present

## 2019-11-25 ENCOUNTER — Other Ambulatory Visit: Payer: Self-pay | Admitting: Family Medicine

## 2019-11-27 ENCOUNTER — Other Ambulatory Visit: Payer: Self-pay

## 2019-11-27 ENCOUNTER — Ambulatory Visit (INDEPENDENT_AMBULATORY_CARE_PROVIDER_SITE_OTHER)
Admission: RE | Admit: 2019-11-27 | Discharge: 2019-11-27 | Disposition: A | Discharge status: Home / Self Care | Payer: HMO | Source: Ambulatory Visit | Attending: Family Medicine | Admitting: Family Medicine

## 2019-11-27 DIAGNOSIS — I779 Disorder of arteries and arterioles, unspecified: Secondary | ICD-10-CM

## 2019-11-27 DIAGNOSIS — I712 Thoracic aortic aneurysm, without rupture: Secondary | ICD-10-CM | POA: Diagnosis not present

## 2019-11-27 DIAGNOSIS — J929 Pleural plaque without asbestos: Secondary | ICD-10-CM | POA: Diagnosis not present

## 2019-11-27 DIAGNOSIS — I2510 Atherosclerotic heart disease of native coronary artery without angina pectoris: Secondary | ICD-10-CM | POA: Diagnosis not present

## 2019-11-27 DIAGNOSIS — I700 Atherosclerosis of aorta: Secondary | ICD-10-CM | POA: Diagnosis not present

## 2019-11-27 MED ORDER — IOHEXOL 350 MG/ML SOLN
100.0000 mL | Freq: Once | INTRAVENOUS | Status: AC | PRN
  Administered 2019-11-27: 100 mL via INTRAVENOUS

## 2019-11-28 DIAGNOSIS — N401 Enlarged prostate with lower urinary tract symptoms: Secondary | ICD-10-CM | POA: Diagnosis not present

## 2019-11-28 DIAGNOSIS — R351 Nocturia: Secondary | ICD-10-CM | POA: Diagnosis not present

## 2019-11-29 ENCOUNTER — Other Ambulatory Visit: Payer: Self-pay

## 2019-11-29 ENCOUNTER — Encounter (HOSPITAL_COMMUNITY): Payer: Self-pay | Admitting: Emergency Medicine

## 2019-11-29 ENCOUNTER — Telehealth: Payer: Self-pay

## 2019-11-29 ENCOUNTER — Emergency Department (HOSPITAL_COMMUNITY)
Admission: EM | Admit: 2019-11-29 | Discharge: 2019-11-29 | Disposition: A | Discharge status: Home / Self Care | Payer: HMO | Attending: Emergency Medicine | Admitting: Emergency Medicine

## 2019-11-29 DIAGNOSIS — I10 Essential (primary) hypertension: Secondary | ICD-10-CM | POA: Diagnosis not present

## 2019-11-29 DIAGNOSIS — R350 Frequency of micturition: Secondary | ICD-10-CM | POA: Diagnosis not present

## 2019-11-29 DIAGNOSIS — Z7982 Long term (current) use of aspirin: Secondary | ICD-10-CM | POA: Diagnosis not present

## 2019-11-29 DIAGNOSIS — M1711 Unilateral primary osteoarthritis, right knee: Secondary | ICD-10-CM | POA: Diagnosis not present

## 2019-11-29 DIAGNOSIS — M109 Gout, unspecified: Secondary | ICD-10-CM | POA: Diagnosis not present

## 2019-11-29 DIAGNOSIS — E669 Obesity, unspecified: Secondary | ICD-10-CM | POA: Diagnosis not present

## 2019-11-29 DIAGNOSIS — G510 Bell's palsy: Secondary | ICD-10-CM | POA: Diagnosis not present

## 2019-11-29 DIAGNOSIS — Z96653 Presence of artificial knee joint, bilateral: Secondary | ICD-10-CM | POA: Insufficient documentation

## 2019-11-29 DIAGNOSIS — E785 Hyperlipidemia, unspecified: Secondary | ICD-10-CM | POA: Diagnosis not present

## 2019-11-29 DIAGNOSIS — J309 Allergic rhinitis, unspecified: Secondary | ICD-10-CM | POA: Diagnosis not present

## 2019-11-29 DIAGNOSIS — Z9181 History of falling: Secondary | ICD-10-CM | POA: Diagnosis not present

## 2019-11-29 DIAGNOSIS — Z794 Long term (current) use of insulin: Secondary | ICD-10-CM | POA: Insufficient documentation

## 2019-11-29 DIAGNOSIS — E119 Type 2 diabetes mellitus without complications: Secondary | ICD-10-CM | POA: Diagnosis not present

## 2019-11-29 DIAGNOSIS — M5416 Radiculopathy, lumbar region: Secondary | ICD-10-CM | POA: Diagnosis not present

## 2019-11-29 DIAGNOSIS — Z79899 Other long term (current) drug therapy: Secondary | ICD-10-CM | POA: Diagnosis not present

## 2019-11-29 DIAGNOSIS — R32 Unspecified urinary incontinence: Secondary | ICD-10-CM | POA: Diagnosis not present

## 2019-11-29 DIAGNOSIS — K219 Gastro-esophageal reflux disease without esophagitis: Secondary | ICD-10-CM | POA: Diagnosis not present

## 2019-11-29 DIAGNOSIS — M48061 Spinal stenosis, lumbar region without neurogenic claudication: Secondary | ICD-10-CM | POA: Diagnosis not present

## 2019-11-29 NOTE — ED Provider Notes (Signed)
[facility] EMERGENCY DEPARTMENT Provider Note   CSN: [id number] Arrival date & time: 11/29/19  1320     History Chief Complaint  Patient presents with  . Hypertension    [name] is a 79 y.o. male with PMHx HTN, HLD, GERD, glaucoma who presents to the ED today for high blood pressure. Pt reports that his PCP wanted patient to have PT at home; a physical therapist came to his home today and checked his blood pressure with a very small cuff and got a reading of 443 systolic. He reports his wife was concerned about the elevated reading and told him to come to the ED for further evaluation. Pt takes losartan, metoprolol, and amlodipine for his blood pressure; has not missed any doses. States he checks his blood pressure at home and it is typically in the 154-008 range systolic. Pt has no complaints currently. His BP in the ED is 144/83 initially and then 140/70. He denies headache, vision changes, chest pain, shortness of breath, confusion, unilateral weakness or numbness, or any other associated symptoms.   The history is provided by the patient and medical records.       Past Medical History:  Diagnosis Date  . Arthritis    arthirtis all over  . Bell's palsy   . Cataract 2020   bilateral eyes -pending surgery June 2020  . Concussion    multiple- college football player  . Diabetes mellitus type 2 in obese ([place])   . Frequent urination at night   . GERD (gastroesophageal reflux disease)    occ indigestion  . Glaucoma    both eyes  . Hyperlipemia   . Hypertension   . Kidney stones   . PONV (postoperative nausea and vomiting)   . Primary localized osteoarthritis of right hip   . Primary osteoarthritis of right knee   . Restless leg syndrome   . Right knee DJD   . Spinal stenosis of lumbar region with radiculopathy    s/p L4/L5 transforaminal blocks    Patient Active Problem List   Diagnosis Date Noted  . Back pain 10/22/2019  . Fall at home  09/27/2019  . Advance care planning 07/25/2019  . Pressure ulcer, buttock 04/17/2019  . Skin irritation 03/29/2019  . Gout 07/22/2018  . HTN (hypertension) 06/11/2017  . Hyperlipemia   . Lower urinary tract symptoms (LUTS) 03/04/2017  . Bell's palsy 10/11/2016  . Paresthesia 05/28/2016  . Medicare annual wellness visit, subsequent 04/22/2016  . Cough 04/22/2016  . Allergic rhinitis 03/12/2016  . 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 Erectile dysfunction 06/11/2015  . Rhinorrhea 03/13/2015  . 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 Dislocation, hip ([place]) 11/09/2014  . DJD (degenerative joint disease) 11/06/2014  . Primary osteoarthritis of right knee   . Primary localized osteoarthritis of right hip   . Spinal stenosis of lumbar region with radiculopathy   . Diabetes mellitus type 2 in obese ([place])   . PONV (postoperative nausea and vomiting)   . GERD (gastroesophageal reflux disease)   . Frequent urination at night   . Kidney stones   . Arthritis   . Neuromuscular disorder ([place])   . Benign prostatic hyperplasia with urinary obstruction 07/14/2012  . Chronic calculus cholecystitis 12/31/2011  . Gouty arthropathy 03/01/2009    Past Surgical History:  Procedure Laterality Date  . ABDOMINAL HERNIA REPAIR    . ANTERIOR HIP REVISION Right 01/08/2015   Procedure: RIGHT ANTERIOR HIP REVISION;  Surgeon: Christia Reading  Maryla Morrow, MD;  Location: [facility];  Service: Orthopedics;  Laterality: Right;  . BACK SURGERY    . CANTHOPLASTY Right 02/09/2017   Procedure: RIGHT LATERAL CANTHOPLASTY;  Surgeon: Irene Limbo, MD;  Location: [facility];  Service: Plastics;  Laterality: Right;  . CANTHOPLASTY Left 06/29/2017   Procedure: LEFT LOWER CANTHOPLASTY;  Surgeon: Irene Limbo, MD;  Location: [facility];  Service: Plastics;  Laterality: Left;  . CATARACT EXTRACTION Left   . COLONOSCOPY    . ELBOW ARTHROSCOPY  1990   right  . ELBOW SURGERY     right  . HERNIA REPAIR   1980   inguinal  . HIP CLOSED REDUCTION Right 11/09/2014   Procedure: CLOSED REDUCTION HIP, s/p total hip 8/9;  Surgeon: Ninetta Lights, MD;  Location: [facility];  Service: Orthopedics;  Laterality: Right;  . JOINT REPLACEMENT  2008   knee  . LITHOTRIPSY    . [place] SURGERY     2015  . RECONSTRUCTION OF EYELID    . REVISION TOTAL HIP ARTHROPLASTY Right 01/08/2015  . SHOULDER ARTHROSCOPY  1980   right  . SHOULDER SURGERY     right  . STONE EXTRACTION WITH BASKET  2010  . TOTAL HIP ARTHROPLASTY Right 11/06/2014   Procedure: RIGHT TOTAL HIP ARTHROPLASTY ANTERIOR APPROACH;  Surgeon: Renette Butters, MD;  Location: [facility];  Service: Orthopedics;  Laterality: Right;  . TOTAL KNEE ARTHROPLASTY Left 07/2006   left knee  . TOTAL KNEE ARTHROPLASTY Right 01/08/2014   Procedure: RIGHT TOTAL KNEE ARTHROPLASTY;  Surgeon: Lorn Junes, MD;  Location: [facility];  Service: Orthopedics;  Laterality: Right;       Family History  Problem Relation Age of Onset  . Hypertension Mother   . Kidney disease Mother   . Hypertension Father   . Diabetes Mellitus II Father   . Colon polyps Father   . Diabetes Mellitus II Brother   . Diabetes Mellitus II Brother   . Prostate cancer Brother   . Colon cancer Neg Hx     Social History   Tobacco Use  . Smoking status: Never Smoker  . Smokeless tobacco: Never Used  Substance Use Topics  . Alcohol use: Yes    Comment: glass of wine every 3-4 months  . Drug use: No    Home Medications Prior to Admission medications   Medication Sig Start Date End Date Taking? Authorizing Provider  allopurinol (ZYLOPRIM) 300 MG tablet TAKE 1/2 TABLET BY MOUTH EVERY MORNING 09/04/19   Tonia Ghent, MD  amLODipine (NORVASC) 10 MG tablet TAKE 1 TABLET BY MOUTH DAILY 10/09/19   Tonia Ghent, MD  aspirin 81 MG tablet Take 81 mg by mouth daily.    [provider]  atorvastatin (LIPITOR) 10 MG tablet TAKE 1 TABLET BY MOUTH EACH NIGHT AT BEDTIME 11/27/19   Tonia Ghent, MD  Blood Glucose Monitoring Suppl (ONE TOUCH ULTRA 2) w/Device KIT 1 each daily by Does not apply route. 02/12/17   Renato Shin, MD  CINNAMON PO Take 200 mg by mouth daily.     [provider]  Continuous Blood Gluc Receiver (FREESTYLE LIBRE 14 DAY READER) DEVI 1 Device by Other route daily. Use daily as needed to check sugar.  Dx E11.9 with hyperglycemia requiring insulin/mult sugar checks 08/15/19   Tonia Ghent, MD  Continuous Blood Gluc Sensor (FREESTYLE LIBRE 14 DAY SENSOR) MISC 1 Device by Does not apply route every 14 (  fourteen) days. Use daily PRN to check sugar. Dx E11.9 w/hyperglycemia requiring insulin 11/16/19   Tonia Ghent, MD  dapagliflozin propanediol (FARXIGA) 5 MG TABS tablet Take 1 tablet (5 mg total) by mouth daily before breakfast. 09/26/19   Tonia Ghent, MD  diclofenac sodium (VOLTAREN) 1 % GEL  09/20/18   [provider]  dorzolamide-timolol (COSOPT) 22.3-6.8 MG/ML ophthalmic solution Place 1 drop into the left eye 2 (two) times daily.  01/06/18   [provider]  Dulaglutide (TRULICITY) 1.5 FM/3.8GY SOPN Inject 1.5 mg into the skin once a week. 07/20/19   Tonia Ghent, MD  gemfibrozil (LOPID) 600 MG tablet TAKE 1 TABLET BY MOUTH TWICE A DAY 09/25/19   Tonia Ghent, MD  glucose blood (ONE TOUCH ULTRA TEST) test strip Check blood sugar before and after each meal and as directed. Dx E11.9 05/26/18   Tonia Ghent, MD  insulin glargine, 2 Unit Dial, (TOUJEO MAX SOLOSTAR) 300 UNIT/ML Solostar Pen 120 units in the AM and 80 in the PM. 09/26/19   Tonia Ghent, MD  Insulin Pen Needle (B-D UF III MINI PEN NEEDLES) 31G X 5 MM MISC 1 each by Does not apply route daily. Use to administer insulin daily 03/07/18   Renato Shin, MD  latanoprost (XALATAN) 0.005 % ophthalmic solution Place 1 drop into both eyes at bedtime.    [provider]  losartan (COZAAR) 100 MG tablet TAKE 1 TABLET BY MOUTH DAILY 09/04/19   Tonia Ghent, MD  meloxicam (MOBIC) 15 MG tablet Take 0.5-1 tablets (7.5-15 mg total) by mouth daily as needed for pain (with food). 10/19/19   Tonia Ghent, MD  metoprolol succinate (TOPROL-XL) 25 MG 24 hr tablet Take 2 tablets (50 mg total) by mouth daily. 05/09/19   Tonia Ghent, MD  Multiple Vitamins-Minerals (VITRUM 50+ SENIOR MULTI PO) Take 1 tablet by mouth daily.     [provider]  Tadalafil (CIALIS) 2.5 MG TABS Take 1 tablet (2.5 mg total) by mouth daily. 09/26/19   Tonia Ghent, MD  triamcinolone cream (KENALOG) 0.1 % Apply 1 application topically 2 (two) times daily as needed. 03/27/19   Tonia Ghent, MD    Allergies    Morphine and related, Codeine sulfate, and Metformin and related  Review of Systems   Review of Systems  Constitutional: Negative for chills and fever.  Eyes: Negative for visual disturbance.  Respiratory: Negative for shortness of breath.   Cardiovascular: Negative for chest pain.  Neurological: Negative for dizziness, syncope, weakness, light-headedness, numbness and headaches.  All other systems reviewed and are negative.   Physical Exam Updated Vital Signs BP 140/70 (BP Location: Right Arm)   Pulse 81   Temp 98.5 F (36.9 C) (Oral)   Resp 14   SpO2 98%   Physical Exam Vitals and nursing note reviewed.  Constitutional:      Appearance: He is not ill-appearing.  HENT:     Head: Normocephalic and atraumatic.  Eyes:     Conjunctiva/sclera: Conjunctivae normal.  Cardiovascular:     Rate and Rhythm: Normal rate and regular rhythm.     Pulses: Normal pulses.  Pulmonary:     Effort: Pulmonary effort is normal.     Breath sounds: Normal breath sounds. No wheezing, rhonchi or rales.  Skin:    General: Skin is warm and dry.     Coloration: Skin is not jaundiced.  Neurological:     General: No focal  deficit present.     Mental Status: He is alert and oriented to person, place, and time.     ED Results / Procedures / Treatments     Labs (all labs ordered are listed, but only abnormal results are displayed) Labs Reviewed - No data to display  EKG None  Radiology No results found.  Procedures Procedures (including critical care time)  Medications Ordered in ED Medications - No data to display  ED Course  I have reviewed the triage vital signs and the nursing notes.  Pertinent labs & imaging results that were available during my care of the patient were reviewed by me and considered in my medical decision making (see chart for details).    MDM Rules/Calculators/A&P                          79 year old male who presents to the ED today for high blood pressure.  Received a reading at home that was 213 systolic when his physical therapist came to the home today, patient states she used a very small cough that she normally does not use.  He has been compliant with his hypertensive medications and has not missed any doses.  Patient without any complaints currently.  On arrival to the ED patient's blood pressure 144/83 and repeat 140/70.  He is well-appearing, alert and oriented x4.  Moving all extremities without difficulty no focal neuro deficits on exam today.  Will discharge patient home.  Instructed to continue taking his medications as prescribed and to check his blood pressure with his cuff.  He is advised to follow-up with his PCP.  Strict return precautions have been discussed with patient.  He is in agreement with plan is stable for discharge home.  Attending physician Dr. Regenia Skeeter has evaluated patient as well and agrees with.   This note was prepared using Dragon voice recognition software and may include unintentional dictation errors due to the inherent limitations of voice recognition software.  Final Clinical Impression(s) / ED Diagnoses Final diagnoses:  Essential hypertension    Rx / DC Orders ED Discharge Orders    None       Discharge Instructions     Your blood pressure in the ED is  within normal limits. It is likely that you got an elevated reading at home due to the blood pressure cuff not fitting you appropriately.   Continue taking your blood pressure medication at home. Check your  blood pressure with your own cuff sporadically. Follow up with your PCP regarding your ED visit.   Return to the ED for any worsening symptoms including blood pressure > 086 VHQIONGE/952 diastolic, severe headache, vision changes, chest pain, shortness of breath, abdominal pain, nausea, vomiting, or any other new/concerning symptoms.        Eustaquio Maize, PA-C 11/29/19 [place], MD 11/30/19 [phone]

## 2019-11-29 NOTE — Telephone Encounter (Signed)
Per chart review tab pt is at [facility] ED now.

## 2019-11-29 NOTE — Discharge Instructions (Signed)
Your blood pressure in the ED is within normal limits. It is likely that you got an elevated reading at home due to the blood pressure cuff not fitting you appropriately.   Continue taking your blood pressure medication at home. Check your  blood pressure with your own cuff sporadically. Follow up with your PCP regarding your ED visit.   Return to the ED for any worsening symptoms including blood pressure > 354 TGYBWLSL/373 diastolic, severe headache, vision changes, chest pain, shortness of breath, abdominal pain, nausea, vomiting, or any other new/concerning symptoms.

## 2019-11-29 NOTE — ED Notes (Signed)
Patient verbalizes understanding of discharge instructions. Opportunity for questioning and answers were provided. Armband removed by staff, pt discharged from ED and ambulated to lobby to return home with significant other.   

## 2019-11-29 NOTE — ED Triage Notes (Signed)
Pt states his home health PT came out today and was unable to get his BP to read, was concerned it was elevated so advised him to come to the ED. Hx of htn that pt takes meds for, denies any missed doses. A/ox4, denies any pain or feeling unwell.

## 2019-11-29 NOTE — Telephone Encounter (Signed)
Will await ER notes.  Thanks.  

## 2019-11-29 NOTE — Telephone Encounter (Signed)
[facility] Day - Client TELEPHONE ADVICE RECORD [facility] Patient Name: [name] Gender: Male DOB: [birth year] Age: 79 Y 5 M 29 D Return Phone Number: [phone] (Secondary), [phone] (Alternate) Address: City/State/Zip: [place] [address] Client [facility] - Client Client Site [place] Physician Renford Dills - MD Contact Type Call Who Is Calling Patient / Member / Family / Caregiver Call Type Triage / Clinical Caller Name Lejon Afzal Relationship To Patient Spouse Return Phone Number [phone] (Secondary) Chief Complaint Blood Pressure High Reason for Call Symptomatic / Request for [place] states husband has blood pressure running high, now 180/100, and wondering what to do. Getting worse everyday. [place]. Translation No Nurse Assessment Nurse: [name], RN, April Date/Time Eilene Ghazi Time): 11/29/2019 12:32:23 PM Confirm and document reason for call. If symptomatic, describe symptoms. ---Caller states her husband denies dizziness, lightheadedness or headaches. His blood pressure is running 186/110. He has had falls within the past month that required EMS to help get him up. His ankles are swollen. Has the patient had close contact with a person known or suspected to have the novel coronavirus illness OR traveled / lives in area with major community spread (including international travel) in the last 14 days from the onset of symptoms? * If Asymptomatic, screen for exposure and travel within the last 14 days. ---No Does the patient have any new or worsening symptoms? ---Yes Will a triage be completed? ---Yes Related visit to physician within the last 2 weeks? ---Yes Does the PT have any chronic conditions? (i.e. diabetes, asthma, this includes High risk factors for pregnancy,  etc.) ---Yes List chronic conditions. ---HTN, DM, high cholesterol, incontinence Is this a behavioral health or substance abuse call? ---No Guidelines Guideline Title Affirmed Question Affirmed Notes Nurse Date/Time (Eastern Time) Blood Pressure - High [9] Systolic BP >= 191 OR Diastolic >= 660 AND [6] cardiac or Hubbard, RN, April 11/29/2019 12:36:24 PM PLEASE NOTE: All timestamps contained within this report are represented as [place] Standard Time. CONFIDENTIALTY NOTICE: This fax transmission is intended only for the addressee. It contains information that is legally privileged, confidential or otherwise protected from use or disclosure. If you are not the intended recipient, you are strictly prohibited from reviewing, disclosing, copying using or disseminating any of this information or taking any action in reliance on or regarding this information. If you have received this fax in error, please notify us immediately by telephone so that we can arrange for its return to [place]. Phone: [phone], Toll-Free: [phone], Fax: [phone] Page: 2 of 2 Call Id: [id number] Guidelines Guideline Title Affirmed Question Affirmed Notes Nurse Date/Time Eilene Ghazi Time) neurologic symptoms (e.g., chest pain, difficulty breathing, unsteady gait, blurred vision) Disp. Time Eilene Ghazi Time) Disposition Final User 11/29/2019 12:38:30 PM Go to ED Now Yes [name], RN, April Caller Disagree/Comply Comply Caller Understands Yes PreDisposition Go to ED Care Advice Given Per Guideline GO TO ED NOW: * You need to be seen in the Emergency Department. CALL EMS 911 IF: * Patient passes out, starts acting confused or becomes too weak to stand. * You become worse. Referrals GO TO FACILITY OTHER - SPECIFY

## 2019-12-05 ENCOUNTER — Telehealth: Payer: Self-pay

## 2019-12-05 ENCOUNTER — Other Ambulatory Visit: Payer: Self-pay | Admitting: Family Medicine

## 2019-12-05 NOTE — Telephone Encounter (Signed)
One of ladies at front desk said pt had brought by the AVS from the ED visit; pt already scheduled for 3 mth FU on 12/28/19. I advised the lady up front that pt could keep the AVS and Dr Damita Dunnings would see the ED record on 11/29/19.

## 2019-12-06 NOTE — Telephone Encounter (Signed)
Agree. Thanks

## 2019-12-08 ENCOUNTER — Ambulatory Visit: Payer: Self-pay

## 2019-12-12 ENCOUNTER — Emergency Department (HOSPITAL_COMMUNITY): Payer: HMO

## 2019-12-12 ENCOUNTER — Emergency Department (HOSPITAL_COMMUNITY)
Admission: EM | Admit: 2019-12-12 | Discharge: 2019-12-12 | Disposition: A | Discharge status: Home / Self Care | Payer: HMO | Attending: Emergency Medicine | Admitting: Emergency Medicine

## 2019-12-12 ENCOUNTER — Encounter (HOSPITAL_COMMUNITY): Payer: Self-pay

## 2019-12-12 ENCOUNTER — Other Ambulatory Visit: Payer: Self-pay

## 2019-12-12 DIAGNOSIS — S80211A Abrasion, right knee, initial encounter: Secondary | ICD-10-CM | POA: Insufficient documentation

## 2019-12-12 DIAGNOSIS — W19XXXA Unspecified fall, initial encounter: Secondary | ICD-10-CM | POA: Diagnosis not present

## 2019-12-12 DIAGNOSIS — Y9301 Activity, walking, marching and hiking: Secondary | ICD-10-CM | POA: Insufficient documentation

## 2019-12-12 DIAGNOSIS — M25561 Pain in right knee: Secondary | ICD-10-CM | POA: Diagnosis not present

## 2019-12-12 DIAGNOSIS — R531 Weakness: Secondary | ICD-10-CM | POA: Diagnosis not present

## 2019-12-12 DIAGNOSIS — Z5321 Procedure and treatment not carried out due to patient leaving prior to being seen by health care provider: Secondary | ICD-10-CM | POA: Insufficient documentation

## 2019-12-12 DIAGNOSIS — R0689 Other abnormalities of breathing: Secondary | ICD-10-CM | POA: Diagnosis not present

## 2019-12-12 DIAGNOSIS — S80212A Abrasion, left knee, initial encounter: Secondary | ICD-10-CM | POA: Diagnosis not present

## 2019-12-12 DIAGNOSIS — M25562 Pain in left knee: Secondary | ICD-10-CM | POA: Diagnosis not present

## 2019-12-12 DIAGNOSIS — R0902 Hypoxemia: Secondary | ICD-10-CM | POA: Diagnosis not present

## 2019-12-12 DIAGNOSIS — R000 Tachycardia, unspecified: Secondary | ICD-10-CM | POA: Diagnosis not present

## 2019-12-12 LAB — BASIC METABOLIC PANEL
Anion gap: 14 (ref 5–15)
BUN: 28 mg/dL — ABNORMAL HIGH (ref 8–23)
CO2: 21 mmol/L — ABNORMAL LOW (ref 22–32)
Calcium: 9.3 mg/dL (ref 8.9–10.3)
Chloride: 104 mmol/L (ref 98–111)
Creatinine, Ser: 1.61 mg/dL — ABNORMAL HIGH (ref 0.61–1.24)
GFR calc Af Amer: 47 mL/min — ABNORMAL LOW (ref >60)
GFR calc non Af Amer: 40 mL/min — ABNORMAL LOW (ref >60)
Glucose, Bld: 255 mg/dL — ABNORMAL HIGH (ref 70–99)
Potassium: 3.7 mmol/L (ref 3.5–5.1)
Sodium: 139 mmol/L (ref 135–145)

## 2019-12-12 LAB — CBC
HCT: 43.9 % (ref 39.0–52.0)
Hemoglobin: 14.6 g/dL (ref 13.0–17.0)
MCH: 30.5 pg (ref 26.0–34.0)
MCHC: 33.3 g/dL (ref 30.0–36.0)
MCV: 91.8 fL (ref 80.0–100.0)
Platelets: 348 10*3/uL (ref 150–400)
RBC: 4.78 MIL/uL (ref 4.22–5.81)
RDW: 14 % (ref 11.5–15.5)
WBC: 8.7 10*3/uL (ref 4.0–10.5)
nRBC: 0 % (ref 0.0–0.2)

## 2019-12-12 LAB — TROPONIN I (HIGH SENSITIVITY): Troponin I (High Sensitivity): 32 ng/L — ABNORMAL HIGH (ref <18)

## 2019-12-12 NOTE — ED Triage Notes (Signed)
Pt bib ems from grocery store, states he was walking out to his car when his legs just gave out, denies dizziness or feeling lightheaded prior to fall. Pt fell to knees, abrasions noted to both knees. Pt alert and oriented, VSS

## 2019-12-12 NOTE — Patient Outreach (Signed)
  [facility]) Care Management Chronic Special Needs Program    12/12/2019  Name: JETT FUKUDA, DOB: May 05, 1940  MRN: [id number]   Mr. Mackinley Kiehn is enrolled in a chronic special needs plan for Diabetes. RNCM called to follow up and update care plan. No answer. HIPAA compliant message left.  Plan: RNCM will outreach within the next 1-2 weeks.  Thea Silversmith, RN, MSN, [place] [phone]

## 2019-12-13 ENCOUNTER — Other Ambulatory Visit: Payer: Self-pay

## 2019-12-13 ENCOUNTER — Telehealth: Payer: Self-pay | Admitting: Family Medicine

## 2019-12-13 NOTE — Telephone Encounter (Signed)
Please get update on patient after ER visit.  Thanks.

## 2019-12-13 NOTE — Patient Outreach (Signed)
  [facility]) Care Management Chronic Special Needs Program    12/13/2019  Name: [name], DOB: Feb 12, 1941  MRN: [id number]   [name] is enrolled in a chronic special needs plan for Diabetes. RNCM called to follow up and review individualized care plan. No answer. HIPAA compliant message left. 2nd outreach attempt.  Plan: Chronic care management coordinator will attempt outreach within 1-2 weeks.  Thea Silversmith, RN, MSN, [place] [phone]

## 2019-12-14 DIAGNOSIS — S8001XA Contusion of right knee, initial encounter: Secondary | ICD-10-CM | POA: Diagnosis not present

## 2019-12-14 DIAGNOSIS — S8002XA Contusion of left knee, initial encounter: Secondary | ICD-10-CM | POA: Diagnosis not present

## 2019-12-14 NOTE — Telephone Encounter (Signed)
Patient says he thinks everything is ok.  He saw Dr. Noemi Chapel this morning and they think that he is fainting and causing him to fall.  He did not break any bones, only skinned up his knee.

## 2019-12-15 ENCOUNTER — Other Ambulatory Visit: Payer: Self-pay

## 2019-12-15 NOTE — Telephone Encounter (Signed)
If he is fainting then we need to get him in for evaluation at that.  Please advise him not to drive in the meantime.  Please schedule him when possible.  Thanks.

## 2019-12-15 NOTE — Patient Outreach (Addendum)
[facility]) Care Management Chronic Special Needs Program  12/15/2019  Name: [name] DOB: 15-Mar-1941  MRN: [id number]  Mr. Kristi Hyer is enrolled in a chronic special needs plan for Diabetes. RNCM called to follow up and update care plan.  Subjective: client reports recent issues with falls and that primary care is aware. He states that he saw orthopedic provider on yesterday and that he is being worked up to determine cause. Client reports he has a neurologist visit coming up and then will be followed by primary care provider visit (scheduled for 12/28/2019. Client reports last A1C "9 something" about 2 months ago. Per records A1C 10.3 on 04/13/19. Client reports blood sugars have been ranging between 90 and 225. He states blood sugar levels never drop below 70. Client reports he has all his medications and denies any difficulty obtaining medications. History of pressure sore-per client 'Hip". Client reports the area is not completely healed, but states it healing and continues to follow recommendations per provider. Client is without questions at this time.  Goals Addressed              This Visit's Progress   .  Client will verbalize knowledge of self management of Hypertension as evidences by BP reading of 140/90 or less; or as defined by provider   No change     Blood pressures per chart  162/84 on 05/09/19 and 150/84 on 04/13/19.  Continue self management strategies: Follow up with your doctor as scheduled. Take your medications as prescribed by your doctor. Ask your doctor "what is my target blood pressure range". Monitor your blood pressure and take results to your doctor's appointment.  Monitor the amount of salt you are eating. Continue to exercise as tolerated and remain active. Eat heart healthy diet (full of fruits, vegetables, whole grains, lean protein, water--limit salt, fat, and sugar/simple carb intake) and increase physical activity as  tolerated. Mailed education: "Dash diet". Please review and call if you have any questions.     .  Client will verbalize understanding of treatment plan for impaired skin integrity and follow up with provider as scheduled.   On track     Discussed client wound status and wound management instructions. Continue to complete wound care per provider receommendation. Notify provider for worsening signs/symptoms.     [name]  HEMOGLOBIN A1C < 7        A1C 10/19/19 9.7 A1C 04/13/2019 10.3 A1C 01/10/2019 8.4 A1C 07/15/2018  9.6  Diabetes self management actions:  Glucose monitoring per provider recommendations  Eat Healthy: plan to eat low carbohydrate, low salt and heart healthy meals with fruits, vegetables, whole grains, lean protein and limit fat, and sugars.  Visit provider every 3-6 months as directed  Hbg A1C level every 3-6 months. Ask your doctor, "what is my Target A1C goal?" Ask your doctor, "what is my Target blood sugar range?" Continue to attend provider visits Continue to take medications as prescribed. Referral to [facility] to assist you with achieving your goals.    .  Increase physical activity        Follow recommendations per doctor. See exercise booklet provided by [employer] for sit down exercises until further direction from providers.     [name]  LIFESTYLE - DECREASE FALLS RISK   On track     Discussed recent falls. Continue fall prevention strategies Use walker or cane whenever walking , inside and outside the home. Inside home: don't use throw  rugs, use plenty of lighting, keep walkways clear of clutter and remove tripping hazards, such as cords. Consider installing bar in the hallway and grab bars in the shower or tub. [employer] [facility] can provide more information at [phone].  It is important to maintain good muscle strength and tone. Please see exercise booklet provided by [employer]. Attend provider visits  as scheduled     .  COMPLETED: Maintain timely refills of diabetic medication as prescribed within the year .        Client denies difficulty obtaining medications.    .  COMPLETED: Obtain annual  Lipid Profile, LDL-C        Done 07/17/19    .  Obtain Annual Eye (retinal)  Exam    On track     Last eye exam noted 01/02/2019.    [name]  COMPLETED: Obtain Annual Foot Exam        Done 04/13/19    .  Obtain annual screen for micro albuminuria (urine) , nephropathy (kidney problems)   On track     Not noted per review of record. It is important to follow up with your provider as scheduled for recommended labs.  This lab looks at how your kidneys are working.    .  COMPLETED: Obtain Hemoglobin A1C at least 2 times per year        A1C 10/19/19 9.7; A1C 04/13/19 10.3    .  Patient stated goal: Weight Loss (pt-stated)   On track     Discussed client's weight loss goal. Discussed availability of health coach. Referral made to [facility]. Stay as active as possible. Follow provider recommendations.        .  COMPLETED: Visit Primary Care Provider or Endocrinologist at least 2 times per year         Primary care provider 09/26/19 and 10/19/19, Annual wellness exam 07/20/19.       Plan: Referral to [employer] [facility] coach to assist with weight loss and physical activity goal, as well as, to assist with improving A1C goal. Send updated care plan to client; send updated care plan to primary care provider; next outreach within the next 6 months.   Thea Silversmith, RN, MSN, [place] [phone]

## 2019-12-15 NOTE — Telephone Encounter (Signed)
Patient stated that he is on a call right now to evaluate him for this and he will call if he needs an appointment here.

## 2019-12-17 NOTE — Telephone Encounter (Signed)
Noted. Thanks.

## 2019-12-19 DIAGNOSIS — M6281 Muscle weakness (generalized): Secondary | ICD-10-CM | POA: Diagnosis not present

## 2019-12-22 ENCOUNTER — Encounter: Payer: Self-pay | Admitting: Gastroenterology

## 2019-12-28 ENCOUNTER — Encounter: Payer: Self-pay | Admitting: Family Medicine

## 2019-12-28 ENCOUNTER — Ambulatory Visit (INDEPENDENT_AMBULATORY_CARE_PROVIDER_SITE_OTHER): Payer: HMO | Admitting: Family Medicine

## 2019-12-28 ENCOUNTER — Other Ambulatory Visit: Payer: Self-pay

## 2019-12-28 VITALS — BP 140/72 | HR 76 | Temp 97.2°F | Ht 72.0 in | Wt 260.3 lb

## 2019-12-28 DIAGNOSIS — E669 Obesity, unspecified: Secondary | ICD-10-CM

## 2019-12-28 DIAGNOSIS — E1169 Type 2 diabetes mellitus with other specified complication: Secondary | ICD-10-CM

## 2019-12-28 DIAGNOSIS — E119 Type 2 diabetes mellitus without complications: Secondary | ICD-10-CM

## 2019-12-28 DIAGNOSIS — Z23 Encounter for immunization: Secondary | ICD-10-CM

## 2019-12-28 LAB — POCT GLYCOSYLATED HEMOGLOBIN (HGB A1C): Hemoglobin A1C: 9.3 % — AB (ref 4.0–5.6)

## 2019-12-28 MED ORDER — METOPROLOL SUCCINATE ER 25 MG PO TB24
25.0000 mg | ORAL_TABLET | Freq: Every day | ORAL | Status: DC
Start: 2019-12-28 — End: 2020-03-28

## 2019-12-28 MED ORDER — TOUJEO MAX SOLOSTAR 300 UNIT/ML ~~LOC~~ SOPN
PEN_INJECTOR | SUBCUTANEOUS | Status: DC
Start: 2019-12-28 — End: 2020-02-19

## 2019-12-28 NOTE — Patient Instructions (Addendum)
Let me check on options with getting prisma.   Don't change your meds for now.  I'll await the notes from neurology and urology.  Plan on recheck in about 3 months.  Take care.  Glad to see you. A1c at the visit.

## 2019-12-28 NOTE — Progress Notes (Signed)
This visit occurred during the SARS-CoV-2 public health emergency.  Safety protocols were in place, including screening questions prior to the visit, additional usage of staff PPE, and extensive cleaning of exam room while observing appropriate contact time as indicated for disinfecting solutions.  He may need a refill on prisma wound dressing and I will check on options.  Sacral wound has healed.  He does not need the medication right now.  Diabetes:  Using medications without difficulties: yes Hypoglycemic episodes:no Hyperglycemic episodes:no Feet problems: he has callous on L foot.  Cautions d/w pt.   Blood Sugars averaging: 80-180. eye exam within last year: has f/u pending for Monday.   A1c not at goal but on dramatically less insulin compared to prev.   His A1c may lag his progress.    covid booster d/w pt.   Flu shot today.    He has urology f/u pending.    Fall cautions d/w pt.  He has neuro f/u pending.    Meds, vitals, and allergies reviewed.   ROS: Per HPI unless specifically indicated in ROS section   GEN: nad, alert and oriented HEENT: ncat NECK: supple w/o LA CV: rrr. PULM: ctab, no inc wob ABD: soft, +bs EXT: no edema SKIN: no acute rash but B knees scraped from prev fall, healing.  Dressed with nonstick bandages at office visit today.  No complication.  No local spreading erythema or fluctuant mass.  Diabetic foot exam: Normal inspection No skin breakdown Callus noted on L distal 1st MT area.   Normal DP pulses Normal sensation to light touch and monofilament on R foot, dec monofilament sensation on the L foot.   Nails mildly thickened.

## 2020-01-01 ENCOUNTER — Other Ambulatory Visit: Payer: Self-pay | Admitting: Family Medicine

## 2020-01-01 DIAGNOSIS — Z961 Presence of intraocular lens: Secondary | ICD-10-CM | POA: Diagnosis not present

## 2020-01-01 DIAGNOSIS — H401123 Primary open-angle glaucoma, left eye, severe stage: Secondary | ICD-10-CM | POA: Diagnosis not present

## 2020-01-01 DIAGNOSIS — H43813 Vitreous degeneration, bilateral: Secondary | ICD-10-CM | POA: Diagnosis not present

## 2020-01-01 DIAGNOSIS — Z794 Long term (current) use of insulin: Secondary | ICD-10-CM | POA: Diagnosis not present

## 2020-01-01 DIAGNOSIS — E119 Type 2 diabetes mellitus without complications: Secondary | ICD-10-CM | POA: Diagnosis not present

## 2020-01-01 DIAGNOSIS — H04123 Dry eye syndrome of bilateral lacrimal glands: Secondary | ICD-10-CM | POA: Diagnosis not present

## 2020-01-01 DIAGNOSIS — H401111 Primary open-angle glaucoma, right eye, mild stage: Secondary | ICD-10-CM | POA: Diagnosis not present

## 2020-01-01 DIAGNOSIS — H2511 Age-related nuclear cataract, right eye: Secondary | ICD-10-CM | POA: Diagnosis not present

## 2020-01-01 LAB — HM DIABETES EYE EXAM

## 2020-01-01 NOTE — Assessment & Plan Note (Signed)
Discussed options. A1c not at goal but on dramatically less insulin compared to prev.   His A1c may lag his progress.   Would continue Farxiga, Trulicity, insulin as is for now. We can recheck his A1c in about 3 months.  See after visit summary.  He can update me as needed in the meantime.  He has follow-up pending with urology and neurology and I will await those notes.  He agrees with plan.

## 2020-01-02 DIAGNOSIS — R350 Frequency of micturition: Secondary | ICD-10-CM | POA: Diagnosis not present

## 2020-01-02 DIAGNOSIS — R351 Nocturia: Secondary | ICD-10-CM | POA: Diagnosis not present

## 2020-01-02 DIAGNOSIS — N401 Enlarged prostate with lower urinary tract symptoms: Secondary | ICD-10-CM | POA: Diagnosis not present

## 2020-01-02 DIAGNOSIS — R3915 Urgency of urination: Secondary | ICD-10-CM | POA: Diagnosis not present

## 2020-01-03 ENCOUNTER — Encounter: Payer: Self-pay | Admitting: Neurology

## 2020-01-03 ENCOUNTER — Telehealth: Payer: Self-pay | Admitting: Neurology

## 2020-01-03 ENCOUNTER — Other Ambulatory Visit: Payer: Self-pay

## 2020-01-03 ENCOUNTER — Ambulatory Visit: Payer: HMO | Admitting: Neurology

## 2020-01-03 VITALS — BP 150/89 | HR 93 | Ht 72.0 in | Wt 262.8 lb

## 2020-01-03 DIAGNOSIS — Z8669 Personal history of other diseases of the nervous system and sense organs: Secondary | ICD-10-CM | POA: Diagnosis not present

## 2020-01-03 DIAGNOSIS — M4807 Spinal stenosis, lumbosacral region: Secondary | ICD-10-CM | POA: Diagnosis not present

## 2020-01-03 DIAGNOSIS — R29898 Other symptoms and signs involving the musculoskeletal system: Secondary | ICD-10-CM | POA: Diagnosis not present

## 2020-01-03 DIAGNOSIS — W19XXXA Unspecified fall, initial encounter: Secondary | ICD-10-CM

## 2020-01-03 NOTE — Telephone Encounter (Signed)
health team order sent to GI. No auth they will reach out to the patient to schedule.  

## 2020-01-03 NOTE — Progress Notes (Signed)
[facility] [address] [place] [address] [phone]       OFFICE CONSULT NOTE  [name] Date of Birth:  03/08/1941 Medical Record Number:  [id number]   Referring MD:  Dr Noemi Chapel Reason for Referral:  Leg weakness and fall HPI: [name] is a 79 year old Caucasian male seen today for initial office consultation visit for recurrent falls and leg weakness.  History is obtained from the patient, review of referral notes and electronic medical records and available pertinent imaging films in PACS.  He has past medical history of arthritis, bilateral knee replacement, obesity, hypertension, hyperlipidemia, degenerative lumbar spine disease s/p surgery.  Remote history of bilateral Bell's palsy in 2018.  Patient states that he has had a couple of falls in the last 5 weeks.  Initially had an episode where he felt weak and had to sit down and had to hold on but did not actually lose consciousness or fall down.  He denied any chest pain, discomfort, palpitations shortness of breath or sweating at that time.  Of 2 weeks later when he was returning from the grocery store and about to get into his car he states that both his legs gave out suddenly without warning and became weak and he fell face forward and hit his head on the car.  He did not lose consciousness.  He was unable to get up and EMS were called and he was taken to [facility] emergency room on 12/12/2019.[name]  He states he stayed there for 3 to 4 hours and did not get evaluation by the physician in felt better and was tired of waiting and left.  He was seen by Dr. Para March his orthopedic surgeon who thought he was fainting and referred him to me for further evaluation.  Patient actually denies any presyncopal symptoms of palpitation chest pain discomfort.  He does have history of degenerative lumbar spine disease and has had previous surgery done but denies any current radicular pain neck pain or pain shooting down  his spine and legs.  He does have mild numbness in his feet and distal weakness likely from underlying chronic diabetic neuropathy.  He has remote history of Bell's palsy initially on the left and a few months later on the right side in 2018 for which is made good recovery.  ROS:   14 system review of systems is positive for falls, knee pain, knee abrasion, leg weakness, imbalance all other systems negative  PMH:  Past Medical History:  Diagnosis Date  . Arthritis    arthirtis all over  . Bell's palsy   . Cataract 2020   bilateral eyes -pending surgery June 2020  . Concussion    multiple- college football player  . Diabetes mellitus type 2 in obese ([place])   . Frequent urination at night   . GERD (gastroesophageal reflux disease)    occ indigestion  . Glaucoma    both eyes  . Hyperlipemia   . Hypertension   . Kidney stones   . PONV (postoperative nausea and vomiting)   . Primary localized osteoarthritis of right hip   . Primary osteoarthritis of right knee   . Restless leg syndrome   . Right knee DJD   . Spinal stenosis of lumbar region with radiculopathy    s/p L4/L5 transforaminal blocks    Social History:  Social History   Socioeconomic History  . Marital status: Married    Spouse name: [name]  . Number of children: 2  .  Years of education: Not on file  . Highest education level: Not on file  Occupational History  . Occupation: retired  Tobacco Use  . Smoking status: Never Smoker  . Smokeless tobacco: Never Used  Substance and Sexual Activity  . Alcohol use: Yes    Comment: glass of wine every 3-4 months  . Drug use: No  . Sexual activity: Never  Other Topics Concern  . 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    1 son, 1 daughter, both out of state   Lives with wife   Right handed   Drinks no caffeine   Social Determinants of Health   Financial Resource Strain:    . Difficulty of Paying Living Expenses: Not on file  Food Insecurity:   . Worried About Charity fundraiser in the Last Year: Not on file  . Ran Out of Food in the Last Year: Not on file  Transportation Needs:   . Lack of Transportation (Medical): Not on file  . Lack of Transportation (Non-Medical): Not on file  Physical Activity:   . Days of Exercise per Week: Not on file  . Minutes of Exercise per Session: Not on file  Stress:   . Feeling of Stress : Not on file  Social Connections:   . Frequency of Communication with Friends and Family: Not on file  . Frequency of Social Gatherings with Friends and Family: Not on file  . Attends Religious Services: Not on file  . Active Member of Clubs or Organizations: Not on file  . Attends Archivist Meetings: Not on file  . Marital Status: Not on file  Intimate Partner Violence:   . Fear of Current or Ex-Partner: Not on file  . Emotionally Abused: Not on file  . Physically Abused: Not on file  . Sexually Abused: Not on file    Medications:   Current Outpatient Medications on File Prior to Visit  Medication Sig Dispense Refill  . allopurinol (ZYLOPRIM) 300 MG tablet TAKE 1/2 TABLET BY MOUTH EVERY MORNING 45 tablet 1  . amLODipine (NORVASC) 10 MG tablet TAKE 1 TABLET BY MOUTH DAILY 90 tablet 1  . aspirin 81 MG tablet Take 81 mg by mouth daily.    [name] atorvastatin (LIPITOR) 10 MG tablet TAKE 1 TABLET BY MOUTH EACH NIGHT AT BEDTIME 90 tablet 3  . Blood Glucose Monitoring Suppl (ONE TOUCH ULTRA 2) w/Device KIT 1 each daily by Does not apply route. 1 each 0  . CINNAMON PO Take 200 mg by mouth daily.     . Continuous Blood Gluc Receiver (FREESTYLE LIBRE 14 DAY READER) DEVI USE DAILY AS NEEDED TO CHECK SUGAR 1 each 0  . Continuous Blood Gluc Sensor (FREESTYLE LIBRE 14 DAY SENSOR) MISC 1 Device by Does not apply route every 14 (fourteen) days. Use daily PRN to check sugar. Dx E11.9 w/hyperglycemia requiring insulin 6 each 3  .  dapagliflozin propanediol (FARXIGA) 5 MG TABS tablet Take 1 tablet (5 mg total) by mouth daily before breakfast. 30 tablet 5  . diclofenac sodium (VOLTAREN) 1 % GEL     . dorzolamide-timolol (COSOPT) 22.3-6.8 MG/ML ophthalmic solution Place 1 drop into the left eye 2 (two) times daily.     . Dulaglutide (TRULICITY) 1.5 UQ/3.3HL SOPN Inject 1.5 mg into the skin once a week. 4 pen 12  . gemfibrozil (LOPID) 600 MG  tablet TAKE 1 TABLET BY MOUTH TWICE A DAY 180 tablet 2  . glucose blood (ONE TOUCH ULTRA TEST) test strip Check blood sugar before and after each meal and as directed. Dx E11.9 200 each 5  . insulin glargine, 2 Unit Dial, (TOUJEO MAX SOLOSTAR) 300 UNIT/ML Solostar Pen 60 units injected in the AM    . Insulin Pen Needle (B-D UF III MINI PEN NEEDLES) 31G X 5 MM MISC 1 each by Does not apply route daily. Use to administer insulin daily 100 each 3  . latanoprost (XALATAN) 0.005 % ophthalmic solution Place 1 drop into both eyes at bedtime.    [name] losartan (COZAAR) 100 MG tablet TAKE 1 TABLET BY MOUTH DAILY 90 tablet 2  . meloxicam (MOBIC) 15 MG tablet Take 0.5-1 tablets (7.5-15 mg total) by mouth daily as needed for pain (with food). 30 tablet 5  . metoprolol succinate (TOPROL-XL) 25 MG 24 hr tablet Take 1 tablet (25 mg total) by mouth daily.    . Multiple Vitamins-Minerals (VITRUM 50+ SENIOR MULTI PO) Take 1 tablet by mouth daily.     . Tadalafil (CIALIS) 2.5 MG TABS Take 1 tablet (2.5 mg total) by mouth daily. 30 tablet 5  . triamcinolone cream (KENALOG) 0.1 % Apply 1 application topically 2 (two) times daily as needed. 30 g 0   No current facility-administered medications on file prior to visit.    Allergies:   Allergies  Allergen Reactions  . Morphine And Related Nausea And Vomiting    Causes nausea & vomiting  . Codeine Sulfate Other (See Comments)    intolerant  . Metformin And Related Other (See Comments)    Gi intolerance, not an allergy    Physical Exam General: Obese elderly  Caucasian male, seated, in no evident distress Head: head normocephalic and atraumatic.   Neck: supple with no carotid or supraclavicular bruits Cardiovascular: regular rate and rhythm, no murmurs Musculoskeletal: no deformity Skin:  no rash/petichiae Vascular:  Normal pulses all extremities  Neurologic Exam Mental Status: Awake and fully alert. Oriented to place and time. Recent and remote memory intact. Attention span, concentration and fund of knowledge appropriate. Mood and affect appropriate.  Cranial Nerves: Fundoscopic exam reveals sharp disc margins. Pupils equal, briskly reactive to light. Extraocular movements full without nystagmus. Visual fields full to confrontation. Hearing mildly diminished in the right ear.. Facial sensation intact.  Old left Bell's palsy with mild weakness of eye closure, lower facial muscles.  No synkinetic movements noted.  Tongue, palate moves normally and symmetrically.  Motor: Normal bulk and tone. Normal strength in all tested extremity muscles.  Except mild weakness of ankle dorsiflexors bilaterally left more than right. Sensory.: intact to touch , pinprick , position sense but diminished vibratory sensation over toes bilaterally..  Coordination: Rapid alternating movements normal in all extremities. Finger-to-nose and heel-to-shin performed accurately bilaterally. Gait and Station: Arises from chair without difficulty. Stance is normal. Gait is cautious but demonstrates normal stride length mild imbalance..  Not able to heel, toe and tandem walk..  Reflexes: 1+ and symmetric except right ankle jerk is depressed and left is absent.. Toes downgoing.       ASSESSMENT: 79 year old Caucasian male with episode of sudden onset of leg weakness and fall of unclear etiology. Given no loss of consciousness or any other associated symptoms doubt this represents TIA or stroke or presyncopal event and given prior history of degenerative spine disease and surgery  restenosis or neuropathy is a consideration.  PLAN: I had a long discussion with the patient regarding his fall and sudden leg weakness and answered questions.  Recommend checking MRI scan of the lumbar spine to rule out any spinal restenosis given prior history of back surgery.  Also check MRI scan cervical spine for his neck pain and EMG nerve conduction study to rule out any underlying neuropathy.  I encouraged him to use his walker or cane at all times and we discussed fall safety precautions. Greater than 50% time during this 45-minute consultation visit was spent on counseling and coordination of care about his sudden leg weakness and fall and answering questions he will return for follow-up in 2 months or call earlier if necessary. Antony Contras, MD Note: This document was prepared with digital dictation and possible smart phrase technology. Any transcriptional errors that result from this process are unintentional.

## 2020-01-03 NOTE — Patient Instructions (Signed)
I had a long discussion with the patient regarding his fall and sudden leg weakness and answered questions.  Recommend checking MRI scan of the lumbar spine to rule out any spinal restenosis given prior history of back surgery.  Also check MRI scan cervical spine for his neck pain and EMG nerve conduction study to rule out any underlying neuropathy.  I encouraged him to use his walker or cane at all times and we discussed fall safety precautions.  He will return for follow-up in 2 months or call earlier if necessary.  Fall Prevention in the Home, Adult Falls can cause injuries. They can happen to people of all ages. There are many things you can do to make your home safe and to help prevent falls. Ask for help when making these changes, if needed. What actions can I take to prevent falls? General Instructions  Use good lighting in all rooms. Replace any light bulbs that burn out.  Turn on the lights when you go into a dark area. Use night-lights.  Keep items that you use often in easy-to-reach places. Lower the shelves around your home if necessary.  Set up your furniture so you have a clear path. Avoid moving your furniture around.  Do not have throw rugs and other things on the floor that can make you trip.  Avoid walking on wet floors.  If any of your floors are uneven, fix them.  Add color or contrast paint or tape to clearly mark and help you see: ? Any grab bars or handrails. ? First and last steps of stairways. ? Where the edge of each step is.  If you use a stepladder: ? Make sure that it is fully opened. Do not climb a closed stepladder. ? Make sure that both sides of the stepladder are locked into place. ? Ask someone to hold the stepladder for you while you use it.  If there are any pets around you, be aware of where they are. What can I do in the bathroom?      Keep the floor dry. Clean up any water that spills onto the floor as soon as it happens.  Remove soap  buildup in the tub or shower regularly.  Use non-skid mats or decals on the floor of the tub or shower.  Attach bath mats securely with double-sided, non-slip rug tape.  If you need to sit down in the shower, use a plastic, non-slip stool.  Install grab bars by the toilet and in the tub and shower. Do not use towel bars as grab bars. What can I do in the bedroom?  Make sure that you have a light by your bed that is easy to reach.  Do not use any sheets or blankets that are too big for your bed. They should not hang down onto the floor.  Have a firm chair that has side arms. You can use this for support while you get dressed. What can I do in the kitchen?  Clean up any spills right away.  If you need to reach something above you, use a strong step stool that has a grab bar.  Keep electrical cords out of the way.  Do not use floor polish or wax that makes floors slippery. If you must use wax, use non-skid floor wax. What can I do with my stairs?  Do not leave any items on the stairs.  Make sure that you have a light switch at the top of the stairs and  the bottom of the stairs. If you do not have them, ask someone to add them for you.  Make sure that there are handrails on both sides of the stairs, and use them. Fix handrails that are broken or loose. Make sure that handrails are as long as the stairways.  Install non-slip stair treads on all stairs in your home.  Avoid having throw rugs at the top or bottom of the stairs. If you do have throw rugs, attach them to the floor with carpet tape.  Choose a carpet that does not hide the edge of the steps on the stairway.  Check any carpeting to make sure that it is firmly attached to the stairs. Fix any carpet that is loose or worn. What can I do on the outside of my home?  Use bright outdoor lighting.  Regularly fix the edges of walkways and driveways and fix any cracks.  Remove anything that might make you trip as you walk  through a door, such as a raised step or threshold.  Trim any bushes or trees on the path to your home.  Regularly check to see if handrails are loose or broken. Make sure that both sides of any steps have handrails.  Install guardrails along the edges of any raised decks and porches.  Clear walking paths of anything that might make someone trip, such as tools or rocks.  Have any leaves, snow, or ice cleared regularly.  Use sand or salt on walking paths during winter.  Clean up any spills in your garage right away. This includes grease or oil spills. What other actions can I take?  Wear shoes that: ? Have a low heel. Do not wear high heels. ? Have rubber bottoms. ? Are comfortable and fit you well. ? Are closed at the toe. Do not wear open-toe sandals.  Use tools that help you move around (mobility aids) if they are needed. These include: ? Canes. ? Walkers. ? Scooters. ? Crutches.  Review your medicines with your doctor. Some medicines can make you feel dizzy. This can increase your chance of falling. Ask your doctor what other things you can do to help prevent falls. Where to find more information  Centers for Disease Control and Prevention, STEADI: https://garcia.biz/  [employer] on Aging: BrainJudge.co.uk Contact a doctor if:  You are afraid of falling at home.  You feel weak, drowsy, or dizzy at home.  You fall at home. Summary  There are many simple things that you can do to make your home safe and to help prevent falls.  Ways to make your home safe include removing tripping hazards and installing grab bars in the bathroom.  Ask for help when making these changes in your home. This information is not intended to replace advice given to you by your health care provider. Make sure you discuss any questions you have with your health care provider. Document Revised: 07/07/2018 Document Reviewed: 10/29/2016 Elsevier Patient Education  2020 [employer].

## 2020-01-12 ENCOUNTER — Encounter: Payer: Self-pay | Admitting: Family Medicine

## 2020-01-18 DIAGNOSIS — N401 Enlarged prostate with lower urinary tract symptoms: Secondary | ICD-10-CM | POA: Diagnosis not present

## 2020-01-18 DIAGNOSIS — R351 Nocturia: Secondary | ICD-10-CM | POA: Diagnosis not present

## 2020-01-18 DIAGNOSIS — N3941 Urge incontinence: Secondary | ICD-10-CM | POA: Diagnosis not present

## 2020-01-19 ENCOUNTER — Telehealth: Payer: Self-pay

## 2020-01-19 NOTE — Telephone Encounter (Signed)
Noted. Thanks.

## 2020-01-19 NOTE — Telephone Encounter (Signed)
Received request from PCP to inquire if imbedded pharmacist would help pt reapply for financial assistance with [facility]. Pt has received assistance in the past but has to reapply every year. Email was sent to Sherre Poot requesting assistance.

## 2020-01-21 ENCOUNTER — Telehealth: Payer: Self-pay

## 2020-01-21 NOTE — Chronic Care Management (AMB) (Signed)
Chronic Care Management Pharmacy Assistant   Name: [name]  MRN: [id number] DOB: 08-08-1940  Reason for Encounter: Patient Assistance Coordination   PCP : Tonia Ghent, MD  Allergies:   Allergies  Allergen Reactions  . Morphine And Related Nausea And Vomiting    Causes nausea & vomiting  . Codeine Sulfate Other (See Comments)    intolerant  . Metformin And Related Other (See Comments)    Gi intolerance, not an allergy    Medications: Outpatient Encounter Medications as of 01/21/2020  Medication Sig  . allopurinol (ZYLOPRIM) 300 MG tablet TAKE 1/2 TABLET BY MOUTH EVERY MORNING  . amLODipine (NORVASC) 10 MG tablet TAKE 1 TABLET BY MOUTH DAILY  . aspirin 81 MG tablet Take 81 mg by mouth daily.  [name] atorvastatin (LIPITOR) 10 MG tablet TAKE 1 TABLET BY MOUTH EACH NIGHT AT BEDTIME  . Blood Glucose Monitoring Suppl (ONE TOUCH ULTRA 2) w/Device KIT 1 each daily by Does not apply route.  [name] CINNAMON PO Take 200 mg by mouth daily.   . Continuous Blood Gluc Receiver (FREESTYLE LIBRE 14 DAY READER) DEVI USE DAILY AS NEEDED TO CHECK SUGAR  . Continuous Blood Gluc Sensor (FREESTYLE LIBRE 14 DAY SENSOR) MISC 1 Device by Does not apply route every 14 (fourteen) days. Use daily PRN to check sugar. Dx E11.9 w/hyperglycemia requiring insulin  . dapagliflozin propanediol (FARXIGA) 5 MG TABS tablet Take 1 tablet (5 mg total) by mouth daily before breakfast.  . diclofenac sodium (VOLTAREN) 1 % GEL   . dorzolamide-timolol (COSOPT) 22.3-6.8 MG/ML ophthalmic solution Place 1 drop into the left eye 2 (two) times daily.   . Dulaglutide (TRULICITY) 1.5 NB/5.6PO SOPN Inject 1.5 mg into the skin once a week.  [name] gemfibrozil (LOPID) 600 MG tablet TAKE 1 TABLET BY MOUTH TWICE A DAY  . glucose blood (ONE TOUCH ULTRA TEST) test strip Check blood sugar before and after each meal and as directed. Dx E11.9  . insulin glargine, 2 Unit Dial, (TOUJEO MAX SOLOSTAR) 300 UNIT/ML Solostar Pen 60 units injected  in the AM  . Insulin Pen Needle (B-D UF III MINI PEN NEEDLES) 31G X 5 MM MISC 1 each by Does not apply route daily. Use to administer insulin daily  . latanoprost (XALATAN) 0.005 % ophthalmic solution Place 1 drop into both eyes at bedtime.  [name] losartan (COZAAR) 100 MG tablet TAKE 1 TABLET BY MOUTH DAILY  . meloxicam (MOBIC) 15 MG tablet Take 0.5-1 tablets (7.5-15 mg total) by mouth daily as needed for pain (with food).  . metoprolol succinate (TOPROL-XL) 25 MG 24 hr tablet Take 1 tablet (25 mg total) by mouth daily.  . Multiple Vitamins-Minerals (VITRUM 50+ SENIOR MULTI PO) Take 1 tablet by mouth daily.   . Tadalafil (CIALIS) 2.5 MG TABS Take 1 tablet (2.5 mg total) by mouth daily.  [name] triamcinolone cream (KENALOG) 0.1 % Apply 1 application topically 2 (two) times daily as needed.   No facility-administered encounter medications on file as of 01/21/2020.    Current Diagnosis: Patient Active Problem List   Diagnosis Date Noted  . Back pain 10/22/2019  . Fall at home 09/27/2019  . Advance care planning 07/25/2019  . Pressure ulcer, buttock 04/17/2019  . Skin irritation 03/29/2019  . Gout 07/22/2018  . HTN (hypertension) 06/11/2017  . Hyperlipemia   . Lower urinary tract symptoms (LUTS) 03/04/2017  . Bell's palsy 10/11/2016  . Paresthesia 05/28/2016  . Medicare annual wellness visit, subsequent 04/22/2016  .  Cough 04/22/2016  . Allergic rhinitis 03/12/2016  . 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 Erectile dysfunction 06/11/2015  . Rhinorrhea 03/13/2015  . 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 Dislocation, hip ([place]) 11/09/2014  . DJD (degenerative joint disease) 11/06/2014  . Primary osteoarthritis of right knee   . Primary localized osteoarthritis of right hip   . Spinal stenosis of lumbar region with radiculopathy   . Diabetes mellitus type 2 in obese ([place])   . PONV (postoperative nausea and vomiting)   . GERD (gastroesophageal reflux disease)   . Frequent urination at night   .  Kidney stones   . Arthritis   . Neuromuscular disorder ([place])   . Benign prostatic hyperplasia with urinary obstruction 07/14/2012  . Chronic calculus cholecystitis 12/31/2011  . Gouty arthropathy 03/01/2009   Adherence Medication Review: Reviewed chart and adherence measure per insurance data, Patient is 98% adherent to [place] for Diabetes, 95% adherent to Losartan for Hypertension and 99% adherent to Atorvastatin for Cholesterol.   Follow-Up:  Patient Assistance Coordination- Patient assistance form filled out for [employer] with [employer], awaiting patient signature and provider signature to fax.  Donette Larry, CPP aware.  Pattricia Boss, [place] Pharmacist Assistant [phone]

## 2020-01-23 ENCOUNTER — Ambulatory Visit
Admission: RE | Admit: 2020-01-23 | Discharge: 2020-01-23 | Disposition: A | Discharge status: Home / Self Care | Payer: HMO | Source: Ambulatory Visit | Attending: Neurology | Admitting: Neurology

## 2020-01-23 ENCOUNTER — Other Ambulatory Visit: Payer: Self-pay

## 2020-01-23 DIAGNOSIS — M4807 Spinal stenosis, lumbosacral region: Secondary | ICD-10-CM | POA: Diagnosis not present

## 2020-01-23 DIAGNOSIS — W19XXXA Unspecified fall, initial encounter: Secondary | ICD-10-CM | POA: Diagnosis not present

## 2020-01-23 DIAGNOSIS — R29898 Other symptoms and signs involving the musculoskeletal system: Secondary | ICD-10-CM

## 2020-01-23 DIAGNOSIS — S199XXA Unspecified injury of neck, initial encounter: Secondary | ICD-10-CM | POA: Diagnosis not present

## 2020-01-23 MED ORDER — GADOBENATE DIMEGLUMINE 529 MG/ML IV SOLN
20.0000 mL | Freq: Once | INTRAVENOUS | Status: AC | PRN
  Administered 2020-01-23: 20 mL via INTRAVENOUS

## 2020-01-25 ENCOUNTER — Ambulatory Visit: Payer: HMO | Admitting: Neurology

## 2020-01-25 ENCOUNTER — Ambulatory Visit (INDEPENDENT_AMBULATORY_CARE_PROVIDER_SITE_OTHER): Payer: HMO | Admitting: Neurology

## 2020-01-25 ENCOUNTER — Other Ambulatory Visit: Payer: Self-pay

## 2020-01-25 DIAGNOSIS — R29898 Other symptoms and signs involving the musculoskeletal system: Secondary | ICD-10-CM

## 2020-01-25 DIAGNOSIS — M4807 Spinal stenosis, lumbosacral region: Secondary | ICD-10-CM

## 2020-01-25 DIAGNOSIS — Z0289 Encounter for other administrative examinations: Secondary | ICD-10-CM

## 2020-01-25 DIAGNOSIS — R200 Anesthesia of skin: Secondary | ICD-10-CM

## 2020-01-25 DIAGNOSIS — R202 Paresthesia of skin: Secondary | ICD-10-CM

## 2020-01-25 NOTE — Progress Notes (Signed)
See procedure note.

## 2020-01-25 NOTE — Progress Notes (Signed)
Full Name: [name] Gender: Male MRN #: [id number] Date of Birth: 04-02-1940    Visit Date: 01/25/2020 07:43 Age: 79 Years Examining Physician: Sarina Ill, MD  Referring Physician: Antony Contras, MD Height: 6 feet 0 inch 262lbs  History: Patient with falls, numbness in the feet. However he denies low back pain or radiculopathy.   Summary: The left Peroneal motor nerve showed no response.  The left tibial motor nerve showed no response.  The left sural sensory nerve showed no response.  The left superficial peroneal sensory nerve showed no response.  The right ulnar sensory nerve showed no response.  The right radial sensory nerve showed delayed distal peak latency (3 ms, normal less than 2.9) and reduced amplitude (9 V, normal greater than 15). All remaining nerves (as indicated in the following tables) were within normal limits.  The right ulnar F wave showed delayed latency (32.4 ms, normal less than 32).All remaining nerves (as indicated in the following tables) were within normal limits.  The left tibialis anterior showed increased spontaneous activity, increased motor unit amplitude, and reduced motor unit recruitment.  The left gastrocnemius showed increased spontaneous activity, prolonged duration and diminished motor unit recruitment.  The left extensor hallucis longus showed increased spontaneous activity and diminished motor unit recruitment.  Could not perform EMG needle study on the lumbar paraspinals as these are unreliable after surgery.  All remaining muscles (as indicated in the following tables) were within normal limits.       Conclusion: 1.  There is electrophysiologic evidence for acute on chronic L5-S1 radiculopathy. 2.  There is concomitant length-dependent axonal sensorimotor polyneuropathy.    Magdalene Molly.D.  [facility] [place], [place] [address] Tel: [phone] Fax: [phone]  Verbal informed consent was obtained  from the patient, patient was informed of potential risk of procedure, including bruising, bleeding, hematoma formation, infection, muscle weakness, muscle pain, numbness, among others.         [place]    Nerve / Sites Muscle Latency Ref. Amplitude Ref. Rel Amp Segments Distance Velocity Ref. Area    ms ms mV mV %  cm m/s m/s mVms  R Ulnar - ADM     Wrist ADM 3.3 ?3.3 6.2 ?6.0 100 Wrist - ADM 7   22.5     B.Elbow ADM 8.0  5.6  91.1 B.Elbow - Wrist 23 49 ?49 21.2     A.Elbow ADM 10.1  5.8  103 A.Elbow - B.Elbow 10 49 ?49 21.4         A.Elbow - Wrist      L Peroneal - EDB     Ankle EDB NR ?6.5 NR ?2.0 NR Ankle - EDB 9   NR     Fib head EDB NR  NR  NR Fib head - Ankle 30 NR ?44 NR  L Tibial - AH     Ankle AH NR ?5.8 NR ?4.0 NR Ankle - AH 9   NR           SNC    Nerve / Sites Rec. Site Peak Lat Ref.  Amp Ref. Segments Distance    ms ms V V  cm  R Radial - Anatomical snuff box (Forearm)     Forearm Wrist 3.0 ?2.9 9 ?15 Forearm - Wrist 10  L Sural - Ankle (Calf)     Calf Ankle NR ?4.4 NR ?6 Calf - Ankle 14  L Superficial peroneal - Ankle  Lat leg Ankle NR ?4.4 NR ?6 Lat leg - Ankle 14  R Ulnar - Orthodromic, (Dig V, Mid palm)     Dig V Wrist NR ?3.1 NR ?5 Dig V - Wrist 37               F  Wave    Nerve F Lat Ref.   ms ms  R Ulnar - ADM 32.4 ?32.0       EMG Summary Table    Spontaneous MUAP Recruitment  Muscle IA Fib PSW Fasc Other Amp Dur. Poly Pattern  L. Vastus medialis Normal None None None _______ Normal Normal Normal Normal  L. Tibialis anterior Normal None 2+ None _______ Increased Normal Normal Reduced  L. Gastrocnemius (Medial head) Normal None 2+ None _______ Normal Increased Normal Reduced  L. Extensor hallucis longus Normal 1+ 1+ None _______ Normal Normal Normal Reduced  L. Biceps femoris (long head) Normal None None None _______ Normal Normal Normal Normal  L. Gluteus medius Normal None None None _______ Normal Normal Normal Normal  L. Gluteus maximus Normal  None None None _______ Normal Normal Normal Normal  R. Iliopsoas Normal None None None _______ Normal Normal Normal Normal

## 2020-01-29 NOTE — Procedures (Signed)
Full Name: [name] Gender: Male MRN #: [id number] Date of Birth: 1941-01-30    Visit Date: 01/25/2020 07:43 Age: 79 Years Examining Physician: Sarina Ill, MD  Referring Physician: Antony Contras, MD Height: 6 feet 0 inch 262lbs  History: Patient with falls, numbness in the feet. However he denies low back pain or radiculopathy.   Summary: The left Peroneal motor nerve showed no response.  The left tibial motor nerve showed no response.  The left sural sensory nerve showed no response.  The left superficial peroneal sensory nerve showed no response.  The right ulnar sensory nerve showed no response.  The right radial sensory nerve showed delayed distal peak latency (3 ms, normal less than 2.9) and reduced amplitude (9 V, normal greater than 15) The right ulnar F wave showed delayed latency (32.4 ms, normal less than 32).All remaining nerves (as indicated in the following tables) were within normal limits.  The left tibialis anterior showed increased spontaneous activity, increased motor unit amplitude, and reduced motor unit recruitment.  The left gastrocnemius showed increased spontaneous activity, prolonged duration and diminished motor unit recruitment.  The left extensor hallucis longus showed increased spontaneous activity and diminished motor unit recruitment.  Could not perform EMG needle study on the lumbar paraspinals as these are unreliable after surgery.  All remaining muscles (as indicated in the following tables) were within normal limits.       Conclusion: 1.  There is electrophysiologic evidence for acute on chronic L5-S1 radiculopathy. 2.  There is concomitant length-dependent axonal sensorimotor polyneuropathy.    Magdalene Molly.D.  [facility] [place], [place] [address] Tel: [phone] Fax: [phone]  Verbal informed consent was obtained from the patient, patient was informed of potential risk of procedure, including  bruising, bleeding, hematoma formation, infection, muscle weakness, muscle pain, numbness, among others.         [place]    Nerve / Sites Muscle Latency Ref. Amplitude Ref. Rel Amp Segments Distance Velocity Ref. Area    ms ms mV mV %  cm m/s m/s mVms  R Ulnar - ADM     Wrist ADM 3.3 ?3.3 6.2 ?6.0 100 Wrist - ADM 7   22.5     B.Elbow ADM 8.0  5.6  91.1 B.Elbow - Wrist 23 49 ?49 21.2     A.Elbow ADM 10.1  5.8  103 A.Elbow - B.Elbow 10 49 ?49 21.4         A.Elbow - Wrist      L Peroneal - EDB     Ankle EDB NR ?6.5 NR ?2.0 NR Ankle - EDB 9   NR     Fib head EDB NR  NR  NR Fib head - Ankle 30 NR ?44 NR  L Tibial - AH     Ankle AH NR ?5.8 NR ?4.0 NR Ankle - AH 9   NR           SNC    Nerve / Sites Rec. Site Peak Lat Ref.  Amp Ref. Segments Distance    ms ms V V  cm  R Radial - Anatomical snuff box (Forearm)     Forearm Wrist 3.0 ?2.9 9 ?15 Forearm - Wrist 10  L Sural - Ankle (Calf)     Calf Ankle NR ?4.4 NR ?6 Calf - Ankle 14  L Superficial peroneal - Ankle     Lat leg Ankle NR ?4.4 NR ?6 Lat leg - Ankle 14  R Ulnar - Orthodromic, (Dig V, Mid palm)     Dig V Wrist NR ?3.1 NR ?5 Dig V - Wrist 21               F  Wave    Nerve F Lat Ref.   ms ms  R Ulnar - ADM 32.4 ?32.0       EMG Summary Table    Spontaneous MUAP Recruitment  Muscle IA Fib PSW Fasc Other Amp Dur. Poly Pattern  L. Vastus medialis Normal None None None _______ Normal Normal Normal Normal  L. Tibialis anterior Normal None 2+ None _______ Increased Normal Normal Reduced  L. Gastrocnemius (Medial head) Normal None 2+ None _______ Normal Increased Normal Reduced  L. Extensor hallucis longus Normal 1+ 1+ None _______ Normal Normal Normal Reduced  L. Biceps femoris (long head) Normal None None None _______ Normal Normal Normal Normal  L. Gluteus medius Normal None None None _______ Normal Normal Normal Normal  L. Gluteus maximus Normal None None None _______ Normal Normal Normal Normal  R. Iliopsoas Normal None None  None _______ Normal Normal Normal Normal

## 2020-01-30 NOTE — Progress Notes (Signed)
Kindly inform the patient that EMG nerve conduction study confirms mild neuropathy in the legs likely related to diabetes as well as evidence of old pinched nerves in the back as a result of his previous back surgery.  No big surprises.  Will discuss further upon follow-up visit.

## 2020-01-31 ENCOUNTER — Telehealth: Payer: Self-pay | Admitting: Emergency Medicine

## 2020-01-31 NOTE — Telephone Encounter (Signed)
Pt's wife returned call. She would like to be called back on [phone]

## 2020-01-31 NOTE — Telephone Encounter (Signed)
Left message on VM to return call 

## 2020-01-31 NOTE — Telephone Encounter (Signed)
-----   Message from Garvin Fila, MD sent at 01/30/2020  8:43 AM EDT ----- [name] inform the patient that EMG nerve conduction study confirms mild neuropathy in the legs likely related to diabetes as well as evidence of old pinched nerves in the back as a result of his previous back surgery.  No big surprises.  Will discuss further upon follow-up visit.

## 2020-01-31 NOTE — Telephone Encounter (Signed)
Returned call and left message on VM to return call.

## 2020-02-01 NOTE — Telephone Encounter (Signed)
Dr. Leonie Man, Patient is requesting a referral to the neuro rehab center?  Reached wife by phone (on DPR),  Went over EMG study findings from Dr. Leonie Man.  Wife verbalized understanding.  Is asking for referral to the neuro rehab center.

## 2020-02-02 NOTE — Telephone Encounter (Signed)
Pt returned call. He will pick up paperwork on Monday.  Paperwork placed at front desk for pickup.

## 2020-02-02 NOTE — Telephone Encounter (Signed)
Paperwork has been completed by imbedded pharmacist and signed by PCP. Paperwork will need to be signed by pt and he needs to add financial info to it and then send into [employer] pt assistance program.   LVM

## 2020-02-06 ENCOUNTER — Encounter: Payer: Self-pay | Admitting: Emergency Medicine

## 2020-02-06 NOTE — Telephone Encounter (Signed)
-----   Message from Garvin Fila, MD sent at 01/30/2020  8:43 AM EDT ----- Mitchell Heir inform the patient that EMG nerve conduction study confirms mild neuropathy in the legs likely related to diabetes as well as evidence of old pinched nerves in the back as a result of his previous back surgery.  No big surprises.  Will discuss further upon follow-up visit.

## 2020-02-07 DIAGNOSIS — M48061 Spinal stenosis, lumbar region without neurogenic claudication: Secondary | ICD-10-CM | POA: Diagnosis not present

## 2020-02-08 ENCOUNTER — Telehealth: Payer: Self-pay | Admitting: Family Medicine

## 2020-02-08 ENCOUNTER — Telehealth: Payer: Self-pay

## 2020-02-08 NOTE — Chronic Care Management (AMB) (Signed)
  Chronic Care Management   Note  02/08/2020 Name: [name] MRN: [id number] DOB: 1940-09-11  DIDIER BRANDENBURG is a 79 y.o. year old male who is a primary care patient of Tonia Ghent, MD. I reached out to [name] by phone today in response to a referral sent by Mr. Ok Edwards Crance's PCP, Tonia Ghent, MD.   Mr. Feenstra was given information about Chronic Care Management services today including:  1. CCM service includes personalized support from designated clinical staff supervised by his physician, including individualized plan of care and coordination with other care providers 2. 24/7 contact phone numbers for assistance for urgent and routine care needs. 3. Service will only be billed when office clinical staff spend 20 minutes or more in a month to coordinate care. 4. Only one practitioner may furnish and bill the service in a calendar month. 5. The patient may stop CCM services at any time (effective at the end of the month) by phone call to the office staff.   [name] verbally agreed to assistance and services provided by embedded care coordination/care management team today.  Follow up plan:   [place]

## 2020-02-08 NOTE — Chronic Care Management (AMB) (Signed)
Patient's wife spoke with [employer] scheduler and would like to relay message to Dr. Damita Dunnings:  Reports she was told by her husband's neurologist, Dr. Ellene Route, that her husband may need back surgery to replace the bars in his back as they are deteriorating. She would like to know if her husband is healthy enough to withstand another surgery. She's concerned the outcome of surgery may not be worth the risk.   Thanks,  Sharyn Lull

## 2020-02-08 NOTE — Progress Notes (Signed)
Kindly inform the patient that MRI scan cervical spine shows age-related changes of wear and tear at multiple levels with bilateral narrowing of the nerve foramina at multiple levels with only mild narrowing of the spinal canal at C4-5.  No need for neck surgery at this time

## 2020-02-09 NOTE — Progress Notes (Signed)
Kindly inform the patient that MRI scan lumbar spine shows severe spinal stenosis between the third and fourth as well as to lesser extent between the second and third vertebrae.  This may be the reason why he fell.  Kindly advise the patient to contact his spine surgeon to evaluate to see if he is a candidate for any further surgery.

## 2020-02-11 ENCOUNTER — Telehealth: Payer: Self-pay | Admitting: Family Medicine

## 2020-02-11 NOTE — Telephone Encounter (Signed)
If patient is considering surgery, then he needs a preop OV to talk about his situation.  24min OV when possible.  Thanks.   See below.  ======================== Patient's wife spoke with [employer] scheduler and would like to relay message to Dr. Damita Dunnings:  Reports she was told by her husband's neurologist, Dr. Ellene Route, that her husband may need back surgery to replace the bars in his back as they are deteriorating. She would like to know if her husband is healthy enough to withstand another surgery. She's concerned the outcome of surgery may not be worth the risk.   Thanks,  Sharyn Lull

## 2020-02-12 ENCOUNTER — Institutional Professional Consult (permissible substitution): Payer: HMO | Admitting: Neurology

## 2020-02-12 ENCOUNTER — Other Ambulatory Visit: Payer: Self-pay

## 2020-02-12 NOTE — Patient Outreach (Signed)
  [facility]) Care Management Chronic Special Needs Program    02/12/2020  Name: [name], DOB: 1940/07/10  MRN: [id number]   [name] is enrolled in a chronic special needs plan for Diabetes. [facility] Management will continue to provide services for this member through 03/29/2020. The [employer] Care Management Team will assume care 03/30/2020.  Thea Silversmith, RN, MSN, [place] [phone]

## 2020-02-13 ENCOUNTER — Telehealth: Payer: Self-pay | Admitting: *Deleted

## 2020-02-13 NOTE — Telephone Encounter (Signed)
LVM requesting call back for MRI results. 

## 2020-02-13 NOTE — Telephone Encounter (Signed)
Called and left brief message to return call to the office.

## 2020-02-14 NOTE — Telephone Encounter (Signed)
LVM #2 requesting call back for MRI results.

## 2020-02-14 NOTE — Telephone Encounter (Signed)
Thanks

## 2020-02-14 NOTE — Telephone Encounter (Signed)
Called and spoke with patient to schedule an office visit with Dr. Damita Dunnings prior to his surgery. Patient has an appointment scheduled on Monday, November 22nd at 1000.

## 2020-02-15 ENCOUNTER — Telehealth: Payer: Self-pay | Admitting: Emergency Medicine

## 2020-02-15 NOTE — Telephone Encounter (Signed)
-----   Message from Garvin Fila, MD sent at 02/09/2020  9:02 AM EST ----- Kindly inform the patient that MRI scan lumbar spine shows severe spinal stenosis between the third and fourth as well as to lesser extent between the second and third vertebrae.  This may be the reason why he fell.  Kindly advise the patient to contact his spine surgeon to evaluate to see if he is a candidate for any further surgery.

## 2020-02-15 NOTE — Telephone Encounter (Signed)
Left message (per DPR) for patient going over Dr. Clydene Fake recommendations and left number for office to call if he had any questions.

## 2020-02-15 NOTE — Telephone Encounter (Signed)
-----   Message from Garvin Fila, MD sent at 02/08/2020  5:51 PM EST ----- Kindly inform the patient that MRI scan cervical spine shows age-related changes of wear and tear at multiple levels with bilateral narrowing of the nerve foramina at multiple levels with only mild narrowing of the spinal canal at C4-5.  No need for neck surgery at this time

## 2020-02-16 ENCOUNTER — Ambulatory Visit: Payer: HMO | Admitting: Gastroenterology

## 2020-02-16 VITALS — BP 130/70 | HR 88 | Ht 72.0 in | Wt 260.8 lb

## 2020-02-16 DIAGNOSIS — R194 Change in bowel habit: Secondary | ICD-10-CM

## 2020-02-16 DIAGNOSIS — Z86010 Personal history of colonic polyps: Secondary | ICD-10-CM

## 2020-02-16 MED ORDER — CITRUCEL PO POWD: 1.0000 | Freq: Every day | ORAL | Status: DC

## 2020-02-16 MED ORDER — SUTAB 1479-225-188 MG PO TABS: 1.0000 | ORAL_TABLET | Freq: Once | ORAL | 0 refills | Status: AC

## 2020-02-16 NOTE — Patient Instructions (Addendum)
If you are age 79 or older, your body mass index should be between 23-30. Your Body mass index is 35.37 kg/m. If this is out of the aforementioned range listed, please consider follow up with your Primary Care Provider.  If you are age 26 or younger, your body mass index should be between 19-25. Your Body mass index is 35.37 kg/m. If this is out of the aformentioned range listed, please consider follow up with your Primary Care Provider.   You have been scheduled for a colonoscopy. Please follow written instructions given to you at your visit today.  Please pick up your prep supplies at the pharmacy within the next 1-3 days. If you use inhalers (even only as needed), please bring them with you on the day of your procedure.  Please take a daily fiber supplement such as Citrucel.   Thank you for entrusting me with your care and for choosing [facility], Dr. [place] Mel Cellar

## 2020-02-16 NOTE — Progress Notes (Signed)
HPI :  79 year old male with a history of diabetes, GERD, colon polyps, here to reestablish care for history of colon polyps and altered bowel habits.  I last saw him in October 2017.  At that point time he had a colonoscopy, details as below.  In short he had 3 small adenomas removed, diverticulosis and hemorrhoids.  He states since he has had a cholecystectomy, he has had bowels that have been altered from constipated, hard stools to looser stools.  Hard stools tend to predominate.  He is not really taking much of anything for that.  Denies any blood in his stools but does have occasional symptoms of hemorrhoids that bother him.  He denies taking any blood thinners at this time, only 81 mg of aspirin a day.  No abdominal pains.  He has rare reflux symptoms, but nothing routinely that bothers him.  He does not need to take anything for his reflux.  Most recent medical problems bothering him has been leg weakness and falls, and back pain.  He has had an MRI of his back and has seen the neurosurgeon, has a pending back surgery sometime after the new year, he is not sure exactly when yet.  He denies any cardiopulmonary symptoms of bother him.  He denies any problems with anesthesia in the past.  We discussed his medical problems and his course over time and if you wish to have any further colonoscopy exams as outlined below.  Colonoscopy 12/30/2015 - The perianal and digital rectal examinations were normal. - A 3 mm polyp was found in the ascending colon. The polyp was sessile. The polyp was removed with a cold biopsy forceps. Resection and retrieval were complete. - A 3 mm polyp was found in the transverse colon. The polyp was sessile. The polyp was removed with a cold biopsy forceps. Resection and retrieval were complete. - A 4 mm polyp was found in the transverse colon. The polyp was sessile. The polyp was removed with a cold snare. Resection and retrieval were complete. - Scattered  medium-mouthed diverticula were found in the left colon. - Non-bleeding internal hemorrhoids were found during retroflexion. - The exam was otherwise without abnormality.  Polyps c/w adenomas    Past Medical History:  Diagnosis Date  . Arthritis    arthirtis all over  . Bell's palsy   . Cataract 2020   bilateral eyes -pending surgery June 2020  . Concussion    multiple- college football player  . Diabetes mellitus type 2 in obese ([place])   . Frequent urination at night   . GERD (gastroesophageal reflux disease)    occ indigestion  . Glaucoma    both eyes  . Hyperlipemia   . Hypertension   . Kidney stones   . PONV (postoperative nausea and vomiting)   . Primary localized osteoarthritis of right hip   . Primary osteoarthritis of right knee   . Restless leg syndrome   . Right knee DJD   . Spinal stenosis of lumbar region with radiculopathy    s/p L4/L5 transforaminal blocks     Past Surgical History:  Procedure Laterality Date  . ABDOMINAL HERNIA REPAIR    . ANTERIOR HIP REVISION Right 01/08/2015   Procedure: RIGHT ANTERIOR HIP REVISION;  Surgeon: Renette Butters, MD;  Location: [place];  Service: Orthopedics;  Laterality: Right;  . BACK SURGERY    . CANTHOPLASTY Right 02/09/2017   Procedure: RIGHT LATERAL CANTHOPLASTY;  Surgeon: Irene Limbo, MD;  Location: [facility];  Service: Plastics;  Laterality: Right;  . CANTHOPLASTY Left 06/29/2017   Procedure: LEFT LOWER CANTHOPLASTY;  Surgeon: Irene Limbo, MD;  Location: [place];  Service: Plastics;  Laterality: Left;  . CATARACT EXTRACTION Left   . COLONOSCOPY    . ELBOW ARTHROSCOPY  1990   right  . ELBOW SURGERY     right  . HERNIA REPAIR  1980   inguinal  . HIP CLOSED REDUCTION Right 11/09/2014   Procedure: CLOSED REDUCTION HIP, s/p total hip 8/9;  Surgeon: Ninetta Lights, MD;  Location: [facility];  Service: Orthopedics;  Laterality: Right;  . JOINT REPLACEMENT  2008   knee  .  LITHOTRIPSY    . [place] SURGERY     2015  . RECONSTRUCTION OF EYELID    . REVISION TOTAL HIP ARTHROPLASTY Right 01/08/2015  . SHOULDER ARTHROSCOPY  1980   right  . SHOULDER SURGERY     right  . STONE EXTRACTION WITH BASKET  2010  . TOTAL HIP ARTHROPLASTY Right 11/06/2014   Procedure: RIGHT TOTAL HIP ARTHROPLASTY ANTERIOR APPROACH;  Surgeon: Renette Butters, MD;  Location: [facility];  Service: Orthopedics;  Laterality: Right;  . TOTAL KNEE ARTHROPLASTY Left 07/2006   left knee  . TOTAL KNEE ARTHROPLASTY Right 01/08/2014   Procedure: RIGHT TOTAL KNEE ARTHROPLASTY;  Surgeon: Lorn Junes, MD;  Location: [place];  Service: Orthopedics;  Laterality: Right;   Family History  Problem Relation Age of Onset  . Hypertension Mother   . Kidney disease Mother   . Hypertension Father   . Diabetes Mellitus II Father   . Colon polyps Father   . Diabetes Mellitus II Brother   . Diabetes Mellitus II Brother   . Prostate cancer Brother   . Colon cancer Neg Hx    Social History   Tobacco Use  . Smoking status: Never Smoker  . Smokeless tobacco: Never Used  Substance Use Topics  . Alcohol use: Yes    Comment: glass of wine every 3-4 months  . Drug use: No   Current Outpatient Medications  Medication Sig Dispense Refill  . allopurinol (ZYLOPRIM) 300 MG tablet TAKE 1/2 TABLET BY MOUTH EVERY MORNING 45 tablet 1  . amLODipine (NORVASC) 10 MG tablet TAKE 1 TABLET BY MOUTH DAILY 90 tablet 1  . aspirin 81 MG tablet Take 81 mg by mouth daily.    [name] atorvastatin (LIPITOR) 10 MG tablet TAKE 1 TABLET BY MOUTH EACH NIGHT AT BEDTIME 90 tablet 3  . Blood Glucose Monitoring Suppl (ONE TOUCH ULTRA 2) w/Device KIT 1 each daily by Does not apply route. 1 each 0  . CINNAMON PO Take 200 mg by mouth daily.     . Continuous Blood Gluc Receiver (FREESTYLE LIBRE 14 DAY READER) DEVI USE DAILY AS NEEDED TO CHECK SUGAR 1 each 0  . Continuous Blood Gluc Sensor (FREESTYLE LIBRE 14 DAY SENSOR) MISC 1 Device by Does not  apply route every 14 (fourteen) days. Use daily PRN to check sugar. Dx E11.9 w/hyperglycemia requiring insulin 6 each 3  . dapagliflozin propanediol (FARXIGA) 5 MG TABS tablet Take 1 tablet (5 mg total) by mouth daily before breakfast. 30 tablet 5  . diclofenac sodium (VOLTAREN) 1 % GEL     . dorzolamide-timolol (COSOPT) 22.3-6.8 MG/ML ophthalmic solution Place 1 drop into the left eye 2 (two) times daily.     . Dulaglutide (TRULICITY) 1.5 IZ/1.2WP SOPN Inject 1.5 mg into the skin once a week.  4 pen 12  . gemfibrozil (LOPID) 600 MG tablet TAKE 1 TABLET BY MOUTH TWICE A DAY 180 tablet 2  . glucose blood (ONE TOUCH ULTRA TEST) test strip Check blood sugar before and after each meal and as directed. Dx E11.9 200 each 5  . insulin glargine, 2 Unit Dial, (TOUJEO MAX SOLOSTAR) 300 UNIT/ML Solostar Pen 60 units injected in the AM    . Insulin Pen Needle (B-D UF III MINI PEN NEEDLES) 31G X 5 MM MISC 1 each by Does not apply route daily. Use to administer insulin daily 100 each 3  . latanoprost (XALATAN) 0.005 % ophthalmic solution Place 1 drop into both eyes at bedtime.    [name] losartan (COZAAR) 100 MG tablet TAKE 1 TABLET BY MOUTH DAILY 90 tablet 2  . meloxicam (MOBIC) 15 MG tablet Take 0.5-1 tablets (7.5-15 mg total) by mouth daily as needed for pain (with food). 30 tablet 5  . metoprolol succinate (TOPROL-XL) 25 MG 24 hr tablet Take 1 tablet (25 mg total) by mouth daily.    . Multiple Vitamins-Minerals (VITRUM 50+ SENIOR MULTI PO) Take 1 tablet by mouth daily.     [name] OZEMPIC, 0.25 OR 0.5 MG/DOSE, 2 MG/1.5ML SOPN Inject into the skin.    . Tadalafil (CIALIS) 2.5 MG TABS Take 1 tablet (2.5 mg total) by mouth daily. 30 tablet 5  . triamcinolone cream (KENALOG) 0.1 % Apply 1 application topically 2 (two) times daily as needed. 30 g 0   No current facility-administered medications for this visit.   Allergies  Allergen Reactions  . Morphine And Related Nausea And Vomiting    Causes nausea & vomiting  .  Codeine Sulfate Other (See Comments)    intolerant  . Metformin And Related Other (See Comments)    Gi intolerance, not an allergy     Review of Systems: All systems reviewed and negative except where noted in HPI.   Lab Results  Component Value Date   WBC 8.7 12/12/2019   HGB 14.6 12/12/2019   HCT 43.9 12/12/2019   MCV 91.8 12/12/2019   PLT 348 12/12/2019    Lab Results  Component Value Date   CREATININE 1.61 (H) 12/12/2019   BUN 28 (H) 12/12/2019   NA 139 12/12/2019   K 3.7 12/12/2019   CL 104 12/12/2019   CO2 21 (L) 12/12/2019    Lab Results  Component Value Date   ALT 33 07/17/2019   AST 38 (H) 07/17/2019   ALKPHOS 37 (L) 07/17/2019   BILITOT 0.6 07/17/2019      Physical Exam: BP 130/70   Pulse 88   Ht 6' (1.829 m)   Wt 260 lb 12.8 oz (118.3 kg)   BMI 35.37 kg/m  Constitutional: Pleasant,well-developed, male in no acute distress. HEENT: Normocephalic and atraumatic. Conjunctivae are normal. No scleral icterus. Neck supple.  Cardiovascular: Normal rate, regular rhythm.  Pulmonary/chest: Effort normal and breath sounds normal. No wheezing, rales or rhonchi. Abdominal: Soft, nondistended but protuberant, nontender. . There are no masses palpable. Extremities: no edema Lymphadenopathy: No cervical adenopathy noted. Neurological: Alert and oriented to person place and time. Skin: Skin is warm and dry. No rashes noted. Psychiatric: Normal mood and affect. Behavior is normal.   ASSESSMENT AND PLAN: 79 year old man here reestablish care for the following:  Altered bowel habits / history of colon polyps - patient with some alternating bowels as outlined above.  Not really taking much of anything for this at present time.  Recommend trial of  fiber supplement every day to help regulate his bowels.  He will try Citrucel once daily and see if that helps.  If this does not work he can contact me, if constipation persist may switch him to [place].  Otherwise we  discussed his history of colon polyps and if he wished to have any further surveillance exams moving forward or if he wished to stop having colonoscopy exams.  He did not have any high risk polyps on his last exam, but had 3 small adenomas.  I discussed colonoscopy with him as well as anesthesia, risks and benefits of these.  One option is to stop all further surveillance exams although he could be at risk for getting colon cancer in his 45s if we stop now, although I think overall low risk given no significant polyp burden on his last exam.  If he wanted to do one more colonoscopy that is also an option if he is up to it and otherwise feeling well and life expectancy could reasonably be another 10 years, he has no high risk cardiopulmonary comorbidities.  If he has another colonoscopy, assuming no high risk lesions, would not recommend any further surveillance exams.  We discussed this for a bit and he wishes to proceed with 1 more colonoscopy before we stop doing surveillance exams.  We will tentatively plan on doing this in January per his request, will try to do this before his back surgery as we do not know how long that will take for him to recover from.  He agreed with the plan, all questions answered.  [place] Cellar, MD [facility]

## 2020-02-19 ENCOUNTER — Encounter: Payer: Self-pay | Admitting: Family Medicine

## 2020-02-19 ENCOUNTER — Ambulatory Visit (INDEPENDENT_AMBULATORY_CARE_PROVIDER_SITE_OTHER): Payer: HMO | Admitting: Family Medicine

## 2020-02-19 ENCOUNTER — Other Ambulatory Visit: Payer: Self-pay

## 2020-02-19 VITALS — BP 160/70 | HR 86 | Temp 98.1°F | Ht 72.0 in | Wt 261.2 lb

## 2020-02-19 DIAGNOSIS — R609 Edema, unspecified: Secondary | ICD-10-CM

## 2020-02-19 DIAGNOSIS — E119 Type 2 diabetes mellitus without complications: Secondary | ICD-10-CM | POA: Diagnosis not present

## 2020-02-19 DIAGNOSIS — M549 Dorsalgia, unspecified: Secondary | ICD-10-CM

## 2020-02-19 DIAGNOSIS — Z01810 Encounter for preprocedural cardiovascular examination: Secondary | ICD-10-CM | POA: Diagnosis not present

## 2020-02-19 DIAGNOSIS — E1169 Type 2 diabetes mellitus with other specified complication: Secondary | ICD-10-CM

## 2020-02-19 DIAGNOSIS — E669 Obesity, unspecified: Secondary | ICD-10-CM

## 2020-02-19 LAB — BASIC METABOLIC PANEL
BUN: 32 mg/dL — ABNORMAL HIGH (ref 6–23)
CO2: 25 mEq/L (ref 19–32)
Calcium: 10.1 mg/dL (ref 8.4–10.5)
Chloride: 102 mEq/L (ref 96–112)
Creatinine, Ser: 1.36 mg/dL (ref 0.40–1.50)
GFR: 49.68 mL/min — ABNORMAL LOW (ref >60.00)
Glucose, Bld: 190 mg/dL — ABNORMAL HIGH (ref 70–99)
Potassium: 4.1 mEq/L (ref 3.5–5.1)
Sodium: 136 mEq/L (ref 135–145)

## 2020-02-19 MED ORDER — TOUJEO MAX SOLOSTAR 300 UNIT/ML ~~LOC~~ SOPN
PEN_INJECTOR | SUBCUTANEOUS | Status: DC
Start: 2020-02-19 — End: 2020-04-10

## 2020-02-19 NOTE — Progress Notes (Signed)
This visit occurred during the SARS-CoV-2 public health emergency.  Safety protocols were in place, including screening questions prior to the visit, additional usage of staff PPE, and extensive cleaning of exam room while observing appropriate contact time as indicated for disinfecting solutions.  He needs an order for prisma sent to [facility].  I told him I would work on that.  We talked about operative considerations.  We talked about post op considerations and home care vs rehab.   Back considerations d/w pt.  Minimal ability to walk now and surgery may improve that.  We do not have a guarantee that he would improve with surgery but he is not likely to improve his back situation by delaying surgery.  No CP.  No SOB.  No BLE edema on some days, some days with some.  No h/o MI or CVA.  No h/o CHF.  Sugar has been 150 to ~200 recently.  None below 110.    He had seen [facility].   See avs.   He is going to [place] on 12/20 for about 1 week.    Meds, vitals, and allergies reviewed.   ROS: Per HPI unless specifically indicated in ROS section   GEN: nad, alert and oriented HEENT:ncat NECK: supple w/o LA CV: rrr. PULM: ctab, no inc wob ABD: soft, +bs FXJ:OITG BLE edema.    SKIN: no acute rash  Chapped gluteal crease  Recent EKG done, reviewed.

## 2020-02-19 NOTE — Patient Instructions (Addendum)
Please sign a release for [facility] records.    Ask Dr. Ellene Route about options for post op care.    Go to the lab on the way out.   If you have mychart we'll likely use that to update you.     Ask the pharmacy to update me if you need a refill on the topical gentamicin.   Take care.  Glad to see you.

## 2020-02-19 NOTE — Telephone Encounter (Signed)
Erika RN LVM with results, recommendations on 02/15/20.

## 2020-02-20 NOTE — Telephone Encounter (Signed)
Paperwork has been returned by pt.  Paperwork faxed to number provided by [employer].

## 2020-02-25 NOTE — Assessment & Plan Note (Signed)
We talked about his situation.  We cannot "clear" a patient for surgery but we can consider his relative risk.  It is likely that his quality of life is going to be worse if he does not have back surgery.  It likely makes sense to get preop echo done.  I put in the order.  Depending on that, we can further discuss his perioperative risk.  At this point it does not make sense to change his medications.  Still okay for outpatient follow-up.  See discussion above.

## 2020-02-25 NOTE — Assessment & Plan Note (Signed)
Needs recheck A1c at end of 02/2020. Discussed with patient.

## 2020-02-27 ENCOUNTER — Telehealth: Payer: Self-pay | Admitting: Family Medicine

## 2020-02-27 NOTE — Telephone Encounter (Signed)
See my chart message

## 2020-02-27 NOTE — Telephone Encounter (Signed)
Enrollment notification received and pt is enrolled for 12 months.

## 2020-03-04 ENCOUNTER — Other Ambulatory Visit: Payer: Self-pay | Admitting: Family Medicine

## 2020-03-06 ENCOUNTER — Other Ambulatory Visit: Payer: Self-pay

## 2020-03-06 ENCOUNTER — Ambulatory Visit (INDEPENDENT_AMBULATORY_CARE_PROVIDER_SITE_OTHER): Payer: HMO

## 2020-03-06 ENCOUNTER — Ambulatory Visit: Payer: HMO

## 2020-03-06 DIAGNOSIS — Z01810 Encounter for preprocedural cardiovascular examination: Secondary | ICD-10-CM | POA: Diagnosis not present

## 2020-03-06 DIAGNOSIS — R609 Edema, unspecified: Secondary | ICD-10-CM | POA: Diagnosis not present

## 2020-03-06 LAB — ECHOCARDIOGRAM COMPLETE
AR max vel: 4.42 cm2
AV Area VTI: 6.24 cm2
AV Area mean vel: 4.45 cm2
AV Mean grad: 4 mmHg
AV Peak grad: 8.1 mmHg
Ao pk vel: 1.42 m/s
Area-P 1/2: 2 cm2
S' Lateral: 3.2 cm

## 2020-03-06 MED ORDER — PERFLUTREN LIPID MICROSPHERE
1.0000 mL | INTRAVENOUS | Status: AC | PRN
  Administered 2020-03-06: 2 mL via INTRAVENOUS

## 2020-03-13 ENCOUNTER — Ambulatory Visit: Payer: HMO | Admitting: Podiatry

## 2020-03-13 ENCOUNTER — Other Ambulatory Visit: Payer: Self-pay

## 2020-03-13 ENCOUNTER — Encounter: Payer: Self-pay | Admitting: Podiatry

## 2020-03-13 DIAGNOSIS — L84 Corns and callosities: Secondary | ICD-10-CM | POA: Diagnosis not present

## 2020-03-13 DIAGNOSIS — E1140 Type 2 diabetes mellitus with diabetic neuropathy, unspecified: Secondary | ICD-10-CM | POA: Diagnosis not present

## 2020-03-13 DIAGNOSIS — E1149 Type 2 diabetes mellitus with other diabetic neurological complication: Secondary | ICD-10-CM

## 2020-03-14 NOTE — Progress Notes (Signed)
Subjective:   Patient ID: [name], male   DOB: 79 y.o.   MRN: [id number]   HPI Patient presents that his wife was concerned about some tissue that formed around his first metatarsal head and he is due to receive his diabetic shoes soon   ROS      Objective:  Physical Exam  Neurovascular status intact with keratotic lesion subfirst metatarsal head left localized with no current erythema edema or drainage associated with it     Assessment:  Probability for keratotic lesion associated with diabetic neuropathy plantar left     Plan:  H&P reviewed condition sterile sharp debridement accomplished and flushed the area and did not note any subcutaneous exposure or indications of breakthrough tissue.  I did explain this will need to be done periodically and is strictly due to friction and nothing to be worried about currently but it does need daily daily inspections.  I then went ahead and discussed his diabetic shoes

## 2020-03-15 ENCOUNTER — Other Ambulatory Visit: Payer: Self-pay

## 2020-03-15 ENCOUNTER — Ambulatory Visit: Payer: HMO | Admitting: Orthotics

## 2020-03-15 DIAGNOSIS — E1149 Type 2 diabetes mellitus with other diabetic neurological complication: Secondary | ICD-10-CM | POA: Diagnosis not present

## 2020-03-15 DIAGNOSIS — M7752 Other enthesopathy of left foot: Secondary | ICD-10-CM

## 2020-03-15 DIAGNOSIS — L84 Corns and callosities: Secondary | ICD-10-CM | POA: Diagnosis not present

## 2020-03-15 DIAGNOSIS — M7751 Other enthesopathy of right foot: Secondary | ICD-10-CM

## 2020-03-15 DIAGNOSIS — E1140 Type 2 diabetes mellitus with diabetic neuropathy, unspecified: Secondary | ICD-10-CM | POA: Diagnosis not present

## 2020-03-20 ENCOUNTER — Other Ambulatory Visit: Payer: Self-pay | Admitting: Family Medicine

## 2020-03-20 NOTE — Telephone Encounter (Signed)
Sent. Thanks.   

## 2020-03-20 NOTE — Telephone Encounter (Signed)
Refill request [name] Last refill 09/26/19 #30/5 Last office visit 02/19/20

## 2020-03-25 ENCOUNTER — Telehealth: Payer: Self-pay

## 2020-03-25 NOTE — Chronic Care Management (AMB) (Addendum)
Chronic Care Management Pharmacy Assistant   Name: [name]  MRN: [id number] DOB: 02-Dec-1940  Reason for Encounter: Initial Questions for CCM visit 04/02/2020  PCP : Tonia Ghent, MD   03/25/20 left message to return call 03/27/20 left message to return call 03/28/20 left message to return call  Allergies:   Allergies  Allergen Reactions   Morphine And Related Nausea And Vomiting    Causes nausea & vomiting   Codeine Sulfate Other (See Comments)    intolerant   Metformin And Related Other (See Comments)    Gi intolerance, not an allergy    Medications: Outpatient Encounter Medications as of 03/25/2020  Medication Sig   allopurinol (ZYLOPRIM) 300 MG tablet TAKE 1/2 TABLET BY MOUTH EVERY MORNING   amLODipine (NORVASC) 10 MG tablet TAKE 1 TABLET BY MOUTH DAILY   aspirin 81 MG tablet Take 81 mg by mouth daily.   atorvastatin (LIPITOR) 10 MG tablet TAKE 1 TABLET BY MOUTH EACH NIGHT AT BEDTIME   Blood Glucose Monitoring Suppl (ONE TOUCH ULTRA 2) w/Device KIT 1 each daily by Does not apply route.   CINNAMON PO Take 200 mg by mouth daily.    Continuous Blood Gluc Receiver (FREESTYLE LIBRE 14 DAY READER) DEVI USE DAILY AS NEEDED TO CHECK SUGAR   Continuous Blood Gluc Sensor (FREESTYLE LIBRE 14 DAY SENSOR) MISC 1 Device by Does not apply route every 14 (fourteen) days. Use daily PRN to check sugar. Dx E11.9 w/hyperglycemia requiring insulin   diclofenac sodium (VOLTAREN) 1 % GEL    dorzolamide-timolol (COSOPT) 22.3-6.8 MG/ML ophthalmic solution Place 1 drop into the left eye 2 (two) times daily.    Dulaglutide (TRULICITY) 1.5 YM/4.1RA SOPN Inject 1.5 mg into the skin once a week.   FARXIGA 5 MG TABS tablet TAKE 1 TABLET BY MOUTH DAILY BEFORE BREAKFAST   gemfibrozil (LOPID) 600 MG tablet TAKE 1 TABLET BY MOUTH TWICE A DAY   glucose blood (ONE TOUCH ULTRA TEST) test strip Check blood sugar before and after each meal and as directed. Dx E11.9   insulin glargine, 2 Unit Dial,  (TOUJEO MAX SOLOSTAR) 300 UNIT/ML Solostar Pen 170 units injected in the AM   Insulin Pen Needle (B-D UF III MINI PEN NEEDLES) 31G X 5 MM MISC 1 each by Does not apply route daily. Use to administer insulin daily   latanoprost (XALATAN) 0.005 % ophthalmic solution Place 1 drop into both eyes at bedtime.   losartan (COZAAR) 100 MG tablet TAKE 1 TABLET BY MOUTH DAILY   meloxicam (MOBIC) 15 MG tablet Take 0.5-1 tablets (7.5-15 mg total) by mouth daily as needed for pain (with food).   methylcellulose (CITRUCEL) oral powder Take 1 packet by mouth daily.   metoprolol succinate (TOPROL-XL) 25 MG 24 hr tablet Take 1 tablet (25 mg total) by mouth daily.   Multiple Vitamins-Minerals (VITRUM 50+ SENIOR MULTI PO) Take 1 tablet by mouth daily.    Tadalafil (CIALIS) 2.5 MG TABS Take 1 tablet (2.5 mg total) by mouth daily.   triamcinolone cream (KENALOG) 0.1 % Apply 1 application topically 2 (two) times daily as needed.   No facility-administered encounter medications on file as of 03/25/2020.    Current Diagnosis: Patient Active Problem List   Diagnosis Date Noted   Back pain 10/22/2019   Fall at home 09/27/2019   Advance care planning 07/25/2019   Pressure ulcer, buttock 04/17/2019   Skin irritation 03/29/2019   Gout 07/22/2018   HTN (hypertension) 06/11/2017  Hyperlipemia    Lower urinary tract symptoms (LUTS) 03/04/2017   Bell's palsy 10/11/2016   Paresthesia 05/28/2016   Medicare annual wellness visit, subsequent 04/22/2016   Cough 04/22/2016   Allergic rhinitis 03/12/2016   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   Erectile dysfunction 06/11/2015   Rhinorrhea 03/13/2015   S/P revision of total hip 01/10/2015   Dislocation, hip ([place]) 11/09/2014   DJD (degenerative joint disease) 11/06/2014   Primary osteoarthritis of right knee    Primary localized osteoarthritis of right hip    Spinal stenosis of lumbar region with radiculopathy    Diabetes mellitus type 2 in obese ([facility])    PONV (postoperative  nausea and vomiting)    GERD (gastroesophageal reflux disease)    Frequent urination at night    Kidney stones    Arthritis    Neuromuscular disorder ([place])    Benign prostatic hyperplasia with urinary obstruction 07/14/2012   Chronic calculus cholecystitis 12/31/2011   Gouty arthropathy 03/01/2009   Multiple attempts made to contact patient for upcoming appointment 04/02/2020. Unable to reach patient to review initial questions for appointment.   Follow-Up:  Pharmacist Review   Debbora Dus, CPP notified  Margaretmary Dys, [place] Pharmacy Assistant [phone]

## 2020-03-27 ENCOUNTER — Ambulatory Visit: Payer: HMO | Admitting: Neurology

## 2020-03-27 ENCOUNTER — Encounter: Payer: Self-pay | Admitting: Neurology

## 2020-03-27 VITALS — BP 166/85 | HR 90 | Ht 72.0 in | Wt 265.0 lb

## 2020-03-27 DIAGNOSIS — M48062 Spinal stenosis, lumbar region with neurogenic claudication: Secondary | ICD-10-CM | POA: Diagnosis not present

## 2020-03-27 NOTE — Progress Notes (Signed)
[facility] [address] [place]. [place] [address] [phone]       OFFICE CONSULT NOTE  [name] Date of Birth:  15-May-1940 Medical Record Number:  [id number]   Referring MD:  Dr Noemi Chapel Reason for Referral:  Leg weakness and fall HPI: [name] is a 79 year old Caucasian male seen today for initial office consultation visit for recurrent falls and leg weakness.  History is obtained from the patient, review of referral notes and electronic medical records and available pertinent imaging films in PACS.  He has past medical history of arthritis, bilateral knee replacement, obesity, hypertension, hyperlipidemia, degenerative lumbar spine disease s/p surgery.  Remote history of bilateral Bell's palsy in 2018.  Patient states that he has had a couple of falls in the last 5 weeks.  Initially had an episode where he felt weak and had to sit down and had to hold on but did not actually lose consciousness or fall down.  He denied any chest pain, discomfort, palpitations shortness of breath or sweating at that time.  Of 2 weeks later when he was returning from the grocery store and about to get into his car he states that both his legs gave out suddenly without warning and became weak and he fell face forward and hit his head on the car.  He did not lose consciousness.  He was unable to get up and EMS were called and he was taken to [facility] emergency room on 12/12/2019.[name]  He states he stayed there for 3 to 4 hours and did not get evaluation by the physician in felt better and was tired of waiting and left.  He was seen by Dr. Para March his orthopedic surgeon who thought he was fainting and referred him to me for further evaluation.  Patient actually denies any presyncopal symptoms of palpitation chest pain discomfort.  He does have history of degenerative lumbar spine disease and has had previous surgery done but denies any current radicular pain neck pain or pain shooting down  his spine and legs.  He does have mild numbness in his feet and distal weakness likely from underlying chronic diabetic neuropathy.  He has remote history of Bell's palsy initially on the left and a few months later on the right side in 2018 for which is made good recovery. Update 03/27/2020: He returns for follow-up after last visit 2 and half months ago.  Is accompanied by his wife.  I also spoke over the phone with his daughter and her physician friend in [place] upon their request.  Patient states he has had no further falls.  He is careful with walking.  He did undergo EMG nerve conduction study on 01/25/2020 by Dr. Jaynee Eagles which confirmed mild peripheral neuropathy likely from diabetes as well as chronic left L5-S1 radiculopathy.  MRI scan of the cervical spine on 01/25/2020 showed mild disc degenerative changes without significant stenosis or compression.  MRI scan of the lumbar spine however shows severe disc bulging facet hypertrophy, ligamentum flavum hypertrophy resulting in severe spinal stenosis at L3-4 with severe bilateral foraminal narrowing.  There is L4-5 posterior interbody fusion with moderate left-sided foraminal narrowing and L5-S1 also there is moderate to severe bilateral foraminal narrowing.  Patient has already spoken to Dr. Ellene Route who have feels he will need more surgery.  Patient is in the process of seeing his primary physician Dr. Damita Dunnings and getting medical clearance for the surgery.  He has had an echocardiogram ordered and will get lab  work soon.  He has no new complaints. ROS:   14 system review of systems is positive for falls, knee pain, knee abrasion, leg weakness, imbalance all other systems negative  PMH:  Past Medical History:  Diagnosis Date  . Arthritis    arthirtis all over  . Bell's palsy   . Cataract 2020   bilateral eyes -pending surgery June 2020  . Concussion    multiple- college football player  . Diabetes mellitus type 2 in obese ([place])   . Frequent  urination at night   . GERD (gastroesophageal reflux disease)    occ indigestion  . Glaucoma    both eyes  . Hyperlipemia   . Hypertension   . Kidney stones   . PONV (postoperative nausea and vomiting)   . Primary localized osteoarthritis of right hip   . Primary osteoarthritis of right knee   . Restless leg syndrome   . Right knee DJD   . Spinal stenosis of lumbar region with radiculopathy    s/p L4/L5 transforaminal blocks    Social History:  Social History   Socioeconomic History  . Marital status: Married    Spouse name: [name]  . Number of children: 2  . Years of education: Not on file  . Highest education level: Not on file  Occupational History  . Occupation: retired  Tobacco Use  . Smoking status: Never Smoker  . Smokeless tobacco: Never Used  Substance and Sexual Activity  . Alcohol use: Yes    Comment: glass of wine every 3-4 months  . Drug use: No  . Sexual activity: Never  Other Topics Concern  . Not on file  Social History Narrative   Lives with wife   Right handed   Drinks no caffeine   Social Determinants of Health   Financial Resource Strain: Not on file  Food Insecurity: Not on file  Transportation Needs: Not on file  Physical Activity: Not on file  Stress: Not on file  Social Connections: Not on file  Intimate Partner Violence: Not on file    Medications:   Current Outpatient Medications on File Prior to Visit  Medication Sig Dispense Refill  . allopurinol (ZYLOPRIM) 300 MG tablet TAKE 1/2 TABLET BY MOUTH EVERY MORNING 45 tablet 1  . amLODipine (NORVASC) 10 MG tablet TAKE 1 TABLET BY MOUTH DAILY 90 tablet 1  . aspirin 81 MG tablet Take 81 mg by mouth daily.    [name] atorvastatin (LIPITOR) 10 MG tablet TAKE 1 TABLET BY MOUTH EACH NIGHT AT BEDTIME 90 tablet 3  . Blood Glucose Monitoring Suppl (ONE TOUCH ULTRA 2) w/Device KIT 1 each daily by Does not apply route. 1 each 0  . CINNAMON PO Take 200 mg by mouth daily.     . Continuous Blood Gluc  Receiver (FREESTYLE LIBRE 14 DAY READER) DEVI USE DAILY AS NEEDED TO CHECK SUGAR 1 each 0  . Continuous Blood Gluc Sensor (FREESTYLE LIBRE 14 DAY SENSOR) MISC 1 Device by Does not apply route every 14 (fourteen) days. Use daily PRN to check sugar. Dx E11.9 w/hyperglycemia requiring insulin 6 each 3  . diclofenac sodium (VOLTAREN) 1 % GEL     . dorzolamide-timolol (COSOPT) 22.3-6.8 MG/ML ophthalmic solution Place 1 drop into the left eye 2 (two) times daily.     . Dulaglutide (TRULICITY) 1.5 WU/9.8JX SOPN Inject 1.5 mg into the skin once a week. 4 pen 12  . FARXIGA 5 MG TABS tablet TAKE 1 TABLET BY MOUTH DAILY BEFORE  BREAKFAST 30 tablet 5  . gemfibrozil (LOPID) 600 MG tablet TAKE 1 TABLET BY MOUTH TWICE A DAY 180 tablet 2  . glucose blood (ONE TOUCH ULTRA TEST) test strip Check blood sugar before and after each meal and as directed. Dx E11.9 200 each 5  . insulin glargine, 2 Unit Dial, (TOUJEO MAX SOLOSTAR) 300 UNIT/ML Solostar Pen 170 units injected in the AM    . Insulin Pen Needle (B-D UF III MINI PEN NEEDLES) 31G X 5 MM MISC 1 each by Does not apply route daily. Use to administer insulin daily 100 each 3  . latanoprost (XALATAN) 0.005 % ophthalmic solution Place 1 drop into both eyes at bedtime.    [name] losartan (COZAAR) 100 MG tablet TAKE 1 TABLET BY MOUTH DAILY 90 tablet 2  . meloxicam (MOBIC) 15 MG tablet Take 0.5-1 tablets (7.5-15 mg total) by mouth daily as needed for pain (with food). 30 tablet 5  . methylcellulose (CITRUCEL) oral powder Take 1 packet by mouth daily.    . metoprolol succinate (TOPROL-XL) 25 MG 24 hr tablet Take 1 tablet (25 mg total) by mouth daily.    . Multiple Vitamins-Minerals (VITRUM 50+ SENIOR MULTI PO) Take 1 tablet by mouth daily.     . Tadalafil (CIALIS) 2.5 MG TABS Take 1 tablet (2.5 mg total) by mouth daily. 30 tablet 5  . triamcinolone cream (KENALOG) 0.1 % Apply 1 application topically 2 (two) times daily as needed. 30 g 0   No current facility-administered  medications on file prior to visit.    Allergies:   Allergies  Allergen Reactions  . Morphine And Related Nausea And Vomiting    Causes nausea & vomiting  . Codeine Sulfate Other (See Comments)    intolerant  . Metformin And Related Other (See Comments)    Gi intolerance, not an allergy    Physical Exam General: Obese elderly Caucasian male, seated, in no evident distress Head: head normocephalic and atraumatic.   Neck: supple with no carotid or supraclavicular bruits Cardiovascular: regular rate and rhythm, no murmurs Musculoskeletal: no deformity Skin:  no rash/petichiae Vascular:  Normal pulses all extremities  Neurologic Exam Mental Status: Awake and fully alert. Oriented to place and time. Recent and remote memory intact. Attention span, concentration and fund of knowledge appropriate. Mood and affect appropriate.  Cranial Nerves: Fundoscopic exam not done.. Pupils equal, briskly reactive to light. Extraocular movements full without nystagmus. Visual fields full to confrontation. Hearing mildly diminished in the right ear.. Facial sensation intact.  Old left Bell's palsy with mild weakness of eye closure, lower facial muscles.  No synkinetic movements noted.  Tongue, palate moves normally and symmetrically.  Motor: Normal bulk and tone. Normal strength in all tested extremity muscles.  Except mild weakness of ankle dorsiflexors bilaterally left more than right. Sensory.: intact to touch , pinprick , position sense but diminished vibratory sensation over toes bilaterally..  Coordination: Rapid alternating movements normal in all extremities. Finger-to-nose and heel-to-shin performed accurately bilaterally. Gait and Station: Arises from chair without difficulty. Stance is normal. Gait is cautious but demonstrates normal stride length mild imbalance..  Not able to heel, toe and tandem walk..  Reflexes: 1+ and symmetric except right ankle jerk is depressed and left is absent.. Toes  downgoing.       ASSESSMENT: 79 year old Caucasian male with episode of sudden onset of leg weakness and fall likely due to severe spinal stenosis at L3-4 with severe bilateral foraminal stenosis.  Given  prior history of  degenerative spine disease and surgery he likely will need additional surgery    PLAN: I had a long discussion with the patient and his wife as well as I spoke to over the phone with his daughter and her friend about the results of EMG nerve conduction study as well as MRI scan of the lumbar spine which suggests severe spinal stenosis above his previously operated site in the lumbar spine.  He is already spoken with Dr. Ellene Route who has recommended surgery and I am in agreement since patient has significant functional disability from from it.  He will see his primary physician Dr. Damita Dunnings to get medical clearance and see Dr. Ellene Route for surgery soon.  He will return for follow-up to see me in the future only as needed and no schedule appointment was made. Greater than 50% time during this 25-minute  visit was spent on counseling and coordination of care about his sudden leg weakness and fall and answering questions  Antony Contras, MD Note: This document was prepared with digital dictation and possible smart phrase technology. Any transcriptional errors that result from this process are unintentional.

## 2020-03-27 NOTE — Patient Instructions (Signed)
I had a long discussion with the patient and his wife as well as I spoke to over the phone with his daughter and her friend about the results of EMG nerve conduction study as well as MRI scan of the lumbar spine which suggests severe spinal stenosis above his previously operated site in the lumbar spine.  He is already spoken with Dr. Ellene Route who has recommended surgery and I am in agreement since patient has significant functional disability from from it.  He will see his primary physician Dr. Damita Dunnings to get medical clearance and see Dr. Ellene Route for surgery soon.  He will return for follow-up to see me in the future only as needed and no schedule appointment was made.  Spinal Stenosis  Spinal stenosis occurs when the open space (spinal canal) between the bones of your spine (vertebrae) narrows, putting pressure on the spinal cord or nerves. What are the causes? This condition is caused by areas of bone pushing into the central canals of your vertebrae. This condition may be present at birth (congenital), or it may be caused by:  Arthritic deterioration of your vertebrae (spinal degeneration). This usually starts around age 64.  Injury or trauma to the spine.  Tumors in the spine.  Calcium deposits in the spine. What are the signs or symptoms? Symptoms of this condition include:  Pain in the neck or back that is generally worse with activities, particularly when standing and walking.  Numbness, tingling, hot or cold sensations, weakness, or weariness in your legs.  Pain going up and down the leg (sciatica).  Frequent episodes of falling.  A foot-slapping gait that leads to muscle weakness. In more serious cases, you may develop:  Problemspassing stool or passing urine.  Difficulty having sex.  Loss of feeling in part or all of your leg. Symptoms may come on slowly and get worse over time. How is this diagnosed? This condition is diagnosed based on your medical history and a physical  exam. Tests will also be done, such as:  MRI.  CT scan.  X-ray. How is this treated? Treatment for this condition often focuses on managing your pain and any other symptoms. Treatment may include:  Practicing good posture to lessen pressure on your nerves.  Exercising to strengthen muscles, build endurance, improve balance, and maintain good joint movement (range of motion).  Losing weight, if needed.  Taking medicines to reduce swelling, inflammation, or pain.  Assistive devices, such as a corset or brace. In some cases, surgery may be needed. The most common procedure is decompression laminectomy. This is done to remove excess bone that puts pressure on your nerve roots. Follow these instructions at home: Managing pain, stiffness, and swelling  Do all exercises and stretches as told by your health care provider.  Practice good posture. If you were given a brace or a corset, wear it as told by your health care provider.  Do not do any activities that cause pain. Ask your health care provider what activities are safe for you.  Do not lift anything that is heavier than 10 lb (4.5 kg) or the limit that your health care provider tells you.  Maintain a healthy weight. Talk with your health care provider if you need help losing weight.  If directed, apply heat to the affected area as often as told by your health care provider. Use the heat source that your health care provider recommends, such as a moist heat pack or a heating pad. ? Place a towel  between your skin and the heat source. ? Leave the heat on for 20-30 minutes. ? Remove the heat if your skin turns bright red. This is especially important if you are not able to feel pain, heat, or cold. You may have a greater risk of getting burned. General instructions  Take over-the-counter and prescription medicines only as told by your health care provider.  Do not use any products that contain nicotine or tobacco, such as  cigarettes and e-cigarettes. If you need help quitting, ask your health care provider.  Eat a healthy diet. This includes plenty of fruits and vegetables, whole grains, and low-fat (lean) protein.  Keep all follow-up visits as told by your health care provider. This is important. Contact a health care provider if:  Your symptoms do not get better or they get worse.  You have a fever. Get help right away if:  You have new or worse pain in your neck or upper back.  You have severe pain that cannot be controlled with medicines.  You are dizzy.  You have vision problems, blurred vision, or double vision.  You have a severe headache that is worse when you stand.  You have nausea or you vomit.  You develop new or worse numbness or tingling in your back or legs.  You have pain, redness, swelling, or warmth in your arm or leg. Summary  Spinal stenosis occurs when the open space (spinal canal) between the bones of your spine (vertebrae) narrows. This narrowing puts pressure on the spinal cord or nerves.  Spinal stenosis can cause numbness, weakness, or pain in the neck, back, and legs.  This condition may be caused by a birth defect, arthritic deterioration of your vertebrae, injury, tumors, or calcium deposits.  This condition is usually diagnosed with MRIs, CT scans, and X-rays. This information is not intended to replace advice given to you by your health care provider. Make sure you discuss any questions you have with your health care provider. Document Revised: 02/26/2017 Document Reviewed: 02/19/2016 Elsevier Patient Education  2020 [employer].

## 2020-03-28 ENCOUNTER — Ambulatory Visit: Payer: HMO | Admitting: Family Medicine

## 2020-03-28 ENCOUNTER — Other Ambulatory Visit: Payer: Self-pay | Admitting: Family Medicine

## 2020-04-01 ENCOUNTER — Other Ambulatory Visit: Payer: Self-pay | Admitting: Family Medicine

## 2020-04-01 NOTE — Telephone Encounter (Signed)
Please Advise

## 2020-04-02 ENCOUNTER — Encounter: Payer: HMO | Admitting: Gastroenterology

## 2020-04-02 ENCOUNTER — Ambulatory Visit: Payer: HMO

## 2020-04-02 ENCOUNTER — Other Ambulatory Visit: Payer: Self-pay

## 2020-04-02 DIAGNOSIS — E119 Type 2 diabetes mellitus without complications: Secondary | ICD-10-CM

## 2020-04-02 DIAGNOSIS — I10 Essential (primary) hypertension: Secondary | ICD-10-CM

## 2020-04-02 NOTE — Chronic Care Management (AMB) (Signed)
Chronic Care Management Pharmacy  Name: [name]  MRN: [id number] DOB: Oct 05, 1940  Chief Complaint/ HPI  [name],  80 y.o. , male presents for their Initial CCM visit with the clinical pharmacist In office.  PCP : Tonia Ghent, MD  Their chronic conditions include: HTN, GERD, DM, RLS, OA/DJD, gout, BPH, HLD, ED, bilateral knee, neuropathy replacement   Office Visits: 02/19/20 - Damita Dunnings - Back pain, PT ordered. Considering surgery, discussed options/risk, ordered echo. Sugar has been 150 to ~200 recently.  None below 110.   12/28/19 - Duncan - Diabetes, Would continue Farxiga, Trulicity, insulin as is for now.v= We can recheck his A1c in about 3 months.  See after visit summary.  He can update me as needed in the meantime.  Consult Visit: 03/27/20 - Neurology - follow up visit for recurrent falls and weakness, surgery discussed for DJD 03/13/20 - Podiatry - follow up for diabetic neuropathy, replace diabetic shoes 02/16/20 - Gastroenterology - follow up for Altered bowel habits / history of colon polyps, patient with some alternating bowels as outlined above.  Not really taking much of anything for this at present time.  Recommend trial of fiber supplement every day to help regulate his bowels.  He will try Citrucel once daily and see if that helps.  If this does not work he can contact me, if constipation persist may switch him to [place]. 37/90/24 - CCM - Trulicity PAP completed and sent to patient and provider for signature. Approved 02/27/20 for 12 months.  Allergies  Allergen Reactions  . Morphine And Related Nausea And Vomiting    Causes nausea & vomiting  . Codeine Sulfate Other (See Comments)    intolerant  . Metformin And Related Other (See Comments)    Gi intolerance, not an allergy   Medications: Outpatient Encounter Medications as of 04/02/2020  Medication Sig  . allopurinol (ZYLOPRIM) 300 MG tablet TAKE 1/2 TABLET BY MOUTH EVERY MORNING  . amLODipine  (NORVASC) 10 MG tablet TAKE 1 TABLET BY MOUTH DAILY  . aspirin 81 MG tablet Take 81 mg by mouth daily.  [name] atorvastatin (LIPITOR) 10 MG tablet TAKE 1 TABLET BY MOUTH EACH NIGHT AT BEDTIME  . Blood Glucose Monitoring Suppl (ONE TOUCH ULTRA 2) w/Device KIT 1 each daily by Does not apply route.  [name] CINNAMON PO Take 200 mg by mouth daily.   . Continuous Blood Gluc Receiver (FREESTYLE LIBRE 14 DAY READER) DEVI USE DAILY AS NEEDED TO CHECK SUGAR  . Continuous Blood Gluc Sensor (FREESTYLE LIBRE 14 DAY SENSOR) MISC 1 Device by Does not apply route every 14 (fourteen) days. Use daily PRN to check sugar. Dx E11.9 w/hyperglycemia requiring insulin  . diclofenac sodium (VOLTAREN) 1 % GEL   . dorzolamide-timolol (COSOPT) 22.3-6.8 MG/ML ophthalmic solution Place 1 drop into the left eye 2 (two) times daily.   . Dulaglutide (TRULICITY) 1.5 OX/7.3ZH SOPN Inject 1.5 mg into the skin once a week.  [name] FARXIGA 5 MG TABS tablet TAKE 1 TABLET BY MOUTH DAILY BEFORE BREAKFAST  . gemfibrozil (LOPID) 600 MG tablet TAKE 1 TABLET BY MOUTH TWICE A DAY  . glucose blood (ONE TOUCH ULTRA TEST) test strip Check blood sugar before and after each meal and as directed. Dx E11.9  . insulin glargine, 2 Unit Dial, (TOUJEO MAX SOLOSTAR) 300 UNIT/ML Solostar Pen 170 units injected in the AM  . Insulin Pen Needle (B-D UF III MINI PEN NEEDLES) 31G X 5 MM MISC 1 each by Does  not apply route daily. Use to administer insulin daily  . latanoprost (XALATAN) 0.005 % ophthalmic solution Place 1 drop into both eyes at bedtime.  [name] losartan (COZAAR) 100 MG tablet TAKE 1 TABLET BY MOUTH DAILY  . meloxicam (MOBIC) 15 MG tablet Take 0.5-1 tablets (7.5-15 mg total) by mouth daily as needed for pain (with food).  . methylcellulose (CITRUCEL) oral powder Take 1 packet by mouth daily.  . metoprolol succinate (TOPROL-XL) 25 MG 24 hr tablet TAKE 1 TABLET BY MOUTH DAILY  . Multiple Vitamins-Minerals (VITRUM 50+ SENIOR MULTI PO) Take 1 tablet by mouth daily.    . Tadalafil 2.5 MG TABS TAKE 1 TABLET BY MOUTH DAILY  . triamcinolone cream (KENALOG) 0.1 % Apply 1 application topically 2 (two) times daily as needed.  . [DISCONTINUED] Tadalafil (CIALIS) 2.5 MG TABS Take 1 tablet (2.5 mg total) by mouth daily.   No facility-administered encounter medications on file as of 04/02/2020.    Current Diagnosis/Assessment:  SDOH Interventions   Flowsheet Row Most Recent Value  SDOH Interventions   Financial Strain Interventions Intervention Not Indicated  [Patient reports medication affordable]     Goals    . Increase physical activity     Follow recommendations per doctor. See exercise booklet provided by [employer] for sit down exercises until further direction from providers.     . Pharmacy Care Plan     CARE PLAN ENTRY (see longitudinal plan of care for additional care plan information)  Current Barriers:  . Chronic Disease Management support, education, and care coordination needs related to Hypertension and Diabetes   Hypertension BP Readings from Last 3 Encounters:  03/27/20 (!) 166/85  02/19/20 (!) 160/70  02/16/20 130/70   . Pharmacist Clinical Goal(s): o Over the next 3 months, patient will work with PharmD and providers to achieve BP goal <140/90 mmHg . Current regimen:   Losartan 100 mg - 1 tablet daily  Metoprolol Succinate ER - 1 tablet daily   Amlodipine 10 mg - 1 tablet daily . Interventions: o Check daily for 7 days after purchase of new Omron BP monitor (arm cuff). o CMA will call for review of home BP readings . Patient self care activities - Over the next 3 months, patient will: o Check BP 7 days, document, and provide at future appointments o Ensure daily salt intake < 2300 mg/day  Diabetes Lab Results  Component Value Date/Time   HGBA1C 9.3 (A) 12/28/2019 11:49 AM   HGBA1C 9.7 (A) 10/19/2019 09:34 AM   HGBA1C 9.9 (H) 07/17/2019 10:28 AM   HGBA1C 9.6 (H) 07/15/2018 09:35 AM   . Pharmacist Clinical  Goal(s): o Over the next 3 months, patient will work with PharmD and providers to achieve A1c goal <7% . Current regimen:   Farxiga 5 mg - 1 tablet daily   Toujeo Max Solostar 300 unit/mL - 170 units every morning   Trulicity 1.5 mg - Inject once weekly  Cinnamon 200 mg daily . Interventions: o Reviewed Libre sensor application - let arm dry prior to application o Recommend increasing Trulicity to 3 mg weekly . Patient self care activities - Over the next 3 months, patient will: o Check blood sugar 3-4 times daily, document, and provide at future appointments o Contact provider with any episodes of hypoglycemia  Medication management . Pharmacist Clinical Goal(s): o Over the next 3 months, patient will work with PharmD and providers to achieve optimal medication adherence . Current pharmacy: [facility] . Interventions o  Comprehensive medication review performed. o Utilize [facility] for medication synchronization, packaging and delivery . Patient self care activities - Over the next 3 months, patient will: o Coordinate with pharmacist to begin adherence packaging  o Take medications as prescribed o Report any questions or concerns to PharmD and/or provider(s)  Initial goal documentation       Hypertension   CMP Latest Ref Rng & Units 02/19/2020 12/12/2019 11/22/2019  Glucose 70 - 99 mg/dL 190(H) 255(H) 157(H)  BUN 6 - 23 mg/dL 32(H) 28(H) 29(H)  Creatinine 0.40 - 1.50 mg/dL 1.36 1.61(H) 1.34  Sodium 135 - 145 mEq/L 136 139 139  Potassium 3.5 - 5.1 mEq/L 4.1 3.7 3.9  Chloride 96 - 112 mEq/L 102 104 102  CO2 19 - 32 mEq/L 25 21(L) 26  Calcium 8.4 - 10.5 mg/dL 10.1 9.3 10.0  Total Protein 6.0 - 8.3 g/dL - - -  Total Bilirubin 0.2 - 1.2 mg/dL - - -  Alkaline Phos 39 - 117 U/L - - -  AST 0 - 37 U/L - - -  ALT 0 - 53 U/L - - -   Office blood pressures are: BP Readings from Last 3 Encounters:  03/27/20 (!) 166/85  02/19/20 (!) 160/70  02/16/20  130/70   Patient has failed these meds in the past: none Patient checks BP at home: home monitor (wrist cuff) is not working Patient home BP readings are ranging: none  Patient is currently uncontrolled on the following medications:   Losartan 100 mg - 1 tablet daily  Metoprolol Succinate ER - 1 tablet daily   Amlodipine 10 mg - 1 tablet daily  We discussed: check daily for 7 days, purchase new Omron BP monitor   Plan: Continue current medications; Check daily for 7 days, purchase new Omron BP monitor (arm cuff).  Hyperlipidemia   LDL goal < 70  Last lipids Lab Results  Component Value Date   CHOL 179 07/17/2019   HDL 37.70 (L) 07/17/2019   LDLCALC 82 05/09/2014   LDLDIRECT 57.0 07/17/2019   TRIG (H) 07/17/2019    484.0 Triglyceride is over 400; calculations on Lipids are invalid.   CHOLHDL 5 07/17/2019   Hepatic Function Latest Ref Rng & Units 07/17/2019 07/15/2018 08/18/2016  Total Protein 6.0 - 8.3 g/dL 7.0 6.9 7.7  Albumin 3.5 - 5.2 g/dL 3.9 3.8 3.9  AST 0 - 37 U/L 38(H) 19 30  ALT 0 - 53 U/L 33 27 29  Alk Phosphatase 39 - 117 U/L 37(L) 36(L) 36(L)  Total Bilirubin 0.2 - 1.2 mg/dL 0.6 0.5 0.7    The 10-year ASCVD risk score Mikey Bussing DC Jr., et al., 2013) is: 75.7%   Values used to calculate the score:     Age: 64 years     Sex: Male     Is Non-Hispanic African American: No     Diabetic: Yes     Tobacco smoker: No     Systolic Blood Pressure: 932 mmHg     Is BP treated: Yes     HDL Cholesterol: 37.7 mg/dL     Total Cholesterol: 179 mg/dL   Patient has failed these meds in past: none reported Patient is currently uncontrolled on the following medications: (per TG > 400, LDL inaccurate 06/2019) . Atorvastatin 10 mg - 1 tablet daily  . Gemfibrozil 600 mg - 1 tablet BID . Aspirin 81 mg - 1 tablet daily  We discussed: TG likely elevated due to uncontrolled diabetes. Work on diabetes control.  Continues current therapy otherwise. No history of statin intolerance so we  could increase statin and consider off gemfibrozil If TG come down.  Plan: Continue current medications  Diabetes   Recent Relevant Labs: Lab Results  Component Value Date/Time   HGBA1C 9.3 (A) 12/28/2019 11:49 AM   HGBA1C 9.7 (A) 10/19/2019 09:34 AM   HGBA1C 9.9 (H) 07/17/2019 10:28 AM   HGBA1C 9.6 (H) 07/15/2018 09:35 AM    Lab Results  Component Value Date   CREATININE 1.36 02/19/2020   BUN 32 (H) 02/19/2020   GFR 49.68 (L) 02/19/2020   GFRNONAA 40 (L) 12/12/2019   GFRAA 47 (L) 12/12/2019   NA 136 02/19/2020   K 4.1 02/19/2020   CALCIUM 10.1 02/19/2020   CO2 25 02/19/2020   Checking BG: Freestyle Libre  Patient hasnt been able to get Libre to stay on and hasnt checked BG recently with glucometer We discussed reasons Elenor Legato may fall off early. Wife reports she has not been allowing the arm to dry after applying alcohol to clean. This could be the reason its falling off early. Also provided materials from [employer] on recommended bandages to apply over the sensor to keep it in place.  Patient has failed these meds in past: Januvia (replaced with Trulicity), Metformin (GI intolerance) Patient is currently uncontrolled on the following medications:   Farxiga 5 mg - 1 tablet daily   Toujeo Max Solostar 300 unit/mL - 170 units every morning   Trulicity 1.5 mg - Inject once weekly  Cinnamon 200 mg daily  Last diabetic foot exam: up to date  Lab Results  Component Value Date/Time   HMDIABEYEEXA No Retinopathy 01/01/2020 12:00 AM    Diet: We discussed limiting carbs to 60 gm per meal and 15 gm with snacks  Discussed only using Boost as meal replacement and using Glucerna or diabetic friendly alternative Snacks - peanuts, cashews, chocolate covered almonds Drinks - diet dr. Malachi Bonds, water, 1-2 Boost/day  We discussed: Patient reports adherence to medications. He does not like [place] as he feels its worsening his incontinence. Discussed benefits of Farxiga on DM, CV risk, and  CKD. Patient to discuss concerns with PCP at visit this month. He tolerates Trulicity well without nausea and is open to a dose increase.   Plan: Consider Trulicity dose increase - Consult PCP.   Osteoarthritis    Patient has failed these meds in past: none reported Patient is currently controlled on the following medications:   Meloxicam 15 mg - 1/2 to 1 tablet daily   We discussed: Patient reports using maybe once per month for pain. Discussed using Tylenol instead if effective.   Plan: Continue current medications; Consider Tylenol prior to meloxicam due to CKD.   Gout   Uric acid (07/17/19) 5.2 Patient has failed these meds in past: none Patient is currently controlled on the following medications:   Allopurinol 300 mg - 1/2 tablet daily in the morning  We discussed: Denies recent gout flares  Plan: Continue current medications   BPH   PSA  Date Value Ref Range Status  07/17/2019 0.37 0.10 - 4.00 ng/ml Final    Comment:    Test performed using Access Hybritech PSA Assay, a parmagnetic partical, chemiluminecent immunoassay.  07/15/2018 0.52 0.10 - 4.00 ng/ml Final    Comment:    Test performed using Access Hybritech PSA Assay, a parmagnetic partical, chemiluminecent immunoassay.  02/08/2017 0.45 0.10 - 4.00 ng/ml Final    Comment:    Test performed using Access  Hybritech PSA Assay, a parmagnetic partical, chemiluminecent immunoassay.    Patient has failed these meds in past: Oxybutynin ER 10 mg - 1 tablet daily (stopped 07/20/19 due to lack of efficacy) Patient is currently uncontrolled on the following medications:   Tadalafil 2.5 mg - 1 tablet daily  We discussed:  Pt reports Tadalafil is for urinary incontinence. Denies ED. Him and wife report incontinence remains a significant problem. They are discussing surgical options with urology.   Plan: Continue current medications  Medication Management   Insurance for medications: HTA  Patient's preferred pharmacy  is:  [facility] [place] [address] Phone: [phone] Fax: [phone]  Uses pill box? yes Reports missing 1 dose about a month due to forgetting  We discussed: HTA diabetes and heart care plan, medications are free. Denies cost concerns.  Patient gets Trulicity through assistance program  Plan  Verbal consent obtained for [facility] enhanced pharmacy services (medication synchronization, adherence packaging, delivery coordination). A medication sync plan was created to allow patient to get all medications delivered once every 30 to 90 days per patient preference. Patient understands they have freedom to choose pharmacy and clinical pharmacist will coordinate care between all prescribers and [facility].   Follow up: 1 month phone visit; CMA follow up call 2 weeks for DM and BP review   Debbora Dus, PharmD Clinical Pharmacist [facility]

## 2020-04-02 NOTE — Telephone Encounter (Signed)
Sent. Thanks.   

## 2020-04-03 ENCOUNTER — Telehealth: Payer: Self-pay

## 2020-04-03 DIAGNOSIS — E1169 Type 2 diabetes mellitus with other specified complication: Secondary | ICD-10-CM

## 2020-04-03 NOTE — Telephone Encounter (Signed)
Patient has requested his preferred pharmacy be changed to [facility] for medication coordination, delivery, and adherence packaging. Preferred pharmacy updated in chart. New prescriptions are needed for all of his medications.  Thank you,  Phil Dopp, PharmD Clinical Pharmacist  Primary Care at [facility] [phone]

## 2020-04-04 ENCOUNTER — Encounter: Payer: Self-pay | Admitting: Gastroenterology

## 2020-04-04 ENCOUNTER — Ambulatory Visit: Payer: HMO | Admitting: Gastroenterology

## 2020-04-04 ENCOUNTER — Other Ambulatory Visit: Payer: Self-pay

## 2020-04-04 VITALS — BP 139/91 | HR 75 | Temp 97.3°F | Resp 10 | Ht 72.0 in | Wt 260.0 lb

## 2020-04-04 DIAGNOSIS — R194 Change in bowel habit: Secondary | ICD-10-CM | POA: Diagnosis not present

## 2020-04-04 DIAGNOSIS — D125 Benign neoplasm of sigmoid colon: Secondary | ICD-10-CM | POA: Diagnosis not present

## 2020-04-04 DIAGNOSIS — D122 Benign neoplasm of ascending colon: Secondary | ICD-10-CM | POA: Diagnosis not present

## 2020-04-04 DIAGNOSIS — E119 Type 2 diabetes mellitus without complications: Secondary | ICD-10-CM | POA: Diagnosis not present

## 2020-04-04 DIAGNOSIS — Z86010 Personal history of colonic polyps: Secondary | ICD-10-CM | POA: Diagnosis not present

## 2020-04-04 DIAGNOSIS — I10 Essential (primary) hypertension: Secondary | ICD-10-CM | POA: Diagnosis not present

## 2020-04-04 MED ORDER — SODIUM CHLORIDE 0.9 % IV SOLN: 500.0000 mL | Freq: Once | INTRAVENOUS | Status: DC

## 2020-04-04 NOTE — Op Note (Signed)
[facility] Patient Name: [name] Procedure Date: 04/04/2020 1:56 PM MRN: [id number] Endoscopist: Remo Lipps P. Havery Moros , MD Age: 80 Referring MD:  Date of Birth: 07-06-1940 Gender: Male Account #: [id number] Procedure:                Colonoscopy Indications:              Change in bowel habits - since improved with daily                            fiber supplementation, history of adenomas in 2017 Medicines:                Monitored Anesthesia Care Procedure:                Pre-Anesthesia Assessment:                           - Prior to the procedure, a History and Physical                            was performed, and patient medications and                            allergies were reviewed. The patient's tolerance of                            previous anesthesia was also reviewed. The risks                            and benefits of the procedure and the sedation                            options and risks were discussed with the patient.                            All questions were answered, and informed consent                            was obtained. Prior Anticoagulants: The patient has                            taken no previous anticoagulant or antiplatelet                            agents. ASA Grade Assessment: II - A patient with                            mild systemic disease. After reviewing the risks                            and benefits, the patient was deemed in                            satisfactory condition to undergo the procedure.  After obtaining informed consent, the colonoscope                            was passed under direct vision. Throughout the                            procedure, the patient's blood pressure, pulse, and                            oxygen saturations were monitored continuously. The                            Olympus [id number]-[id number] (#[id number]) Colonoscope was                             introduced through the anus and advanced to the the                            terminal ileum, with identification of the                            appendiceal orifice and IC valve. The colonoscopy                            was performed without difficulty. The patient                            tolerated the procedure well. The quality of the                            bowel preparation was adequate. The terminal ileum,                            ileocecal valve, appendiceal orifice, and rectum                            were photographed. Scope In: 2:10:58 PM Scope Out: 2:31:36 PM Scope Withdrawal Time: 0 hours 15 minutes 51 seconds  Total Procedure Duration: 0 hours 20 minutes 38 seconds  Findings:                 The perianal and digital rectal examinations were                            normal.                           The terminal ileum appeared normal.                           Two sessile polyps were found in the ascending                            colon. The polyps were 3 to 4 mm in size. These  polyps were removed with a cold snare. Resection                            and retrieval were complete.                           A 4 to 5 mm polyp was found in the sigmoid colon.                            The polyp was sessile. The polyp was removed with a                            cold snare. Resection and retrieval were complete.                           A few small-mouthed diverticula were found in the                            sigmoid colon.                           Internal hemorrhoids were found during retroflexion.                           The exam was otherwise without abnormality.                            Residual stool and vegetable matter was noted in                            right and left colon, lavage performed to clear                            these areas with adequate views. Complications:            No immediate complications.  Estimated blood loss:                            Minimal. Estimated Blood Loss:     Estimated blood loss was minimal. Impression:               - The examined portion of the ileum was normal.                           - Two 3 to 4 mm polyps in the ascending colon,                            removed with a cold snare. Resected and retrieved.                           - One 4 to 5 mm polyp in the sigmoid colon, removed                            with a cold  snare. Resected and retrieved.                           - Diverticulosis in the sigmoid colon.                           - Internal hemorrhoids.                           - The examination was otherwise normal. Recommendation:           - Patient has a contact number available for                            emergencies. The signs and symptoms of potential                            delayed complications were discussed with the                            patient. Return to normal activities tomorrow.                            Written discharge instructions were provided to the                            patient.                           - Resume previous diet.                           - Continue present medications.                           - Continue fiber supplementation daily                           - Await pathology results. Remo Lipps P. Ginnette Gates, MD 04/04/2020 2:39:48 PM This report has been signed electronically.

## 2020-04-04 NOTE — Progress Notes (Signed)
Called to room to assist during endoscopic procedure.  Patient ID and intended procedure confirmed with present staff. Received instructions for my participation in the procedure from the performing physician.  

## 2020-04-04 NOTE — Patient Instructions (Addendum)
Handouts were given to you on polyps, diverticulosis, and hemorrhoids. Your blood sugar was 90 in the recovery room. Per Dr. Adela Lank, continue FIBER SUPPLEMENT daily. You may resume your current medications today. Await biopsy results.  May take 2-3 weeks to receive pathology results. Please call if any questions or concerns.     YOU HAD AN ENDOSCOPIC PROCEDURE TODAY AT THE [facility]:   Refer to the procedure report that was given to you for any specific questions about what was found during the examination.  If the procedure report does not answer your questions, please call your gastroenterologist to clarify.  If you requested that your care partner not be given the details of your procedure findings, then the procedure report has been included in a sealed envelope for you to review at your convenience later.  YOU SHOULD EXPECT: Some feelings of bloating in the abdomen. Passage of more gas than usual.  Walking can help get rid of the air that was put into your GI tract during the procedure and reduce the bloating. If you had a lower endoscopy (such as a colonoscopy or flexible sigmoidoscopy) you may notice spotting of blood in your stool or on the toilet paper. If you underwent a bowel prep for your procedure, you may not have a normal bowel movement for a few days.  Please Note:  You might notice some irritation and congestion in your nose or some drainage.  This is from the oxygen used during your procedure.  There is no need for concern and it should clear up in a day or so.  SYMPTOMS TO REPORT IMMEDIATELY:   Following lower endoscopy (colonoscopy or flexible sigmoidoscopy):  Excessive amounts of blood in the stool  Significant tenderness or worsening of abdominal pains  Swelling of the abdomen that is new, acute  Fever of 100F or higher    For urgent or emergent issues, a gastroenterologist can be reached at any hour by calling [phone]. Do not use MyChart  messaging for urgent concerns.    DIET:  We do recommend a small meal at first, but then you may proceed to your regular diet.  Drink plenty of fluids but you should avoid alcoholic beverages for 24 hours.  ACTIVITY:  You should plan to take it easy for the rest of today and you should NOT DRIVE or use heavy machinery until tomorrow (because of the sedation medicines used during the test).    FOLLOW UP: Our staff will call the number listed on your records 48-72 hours following your procedure to check on you and address any questions or concerns that you may have regarding the information given to you following your procedure. If we do not reach you, we will leave a message.  We will attempt to reach you two times.  During this call, we will ask if you have developed any symptoms of COVID 19. If you develop any symptoms (ie: fever, flu-like symptoms, shortness of breath, cough etc.) before then, please call [phone].  If you test positive for Covid 19 in the 2 weeks post procedure, please call and report this information to [place].    If any biopsies were taken you will be contacted by phone or by letter within the next 1-3 weeks.  Please call us at [phone] if you have not heard about the biopsies in 3 weeks.    SIGNATURES/CONFIDENTIALITY: You and/or your care partner have signed paperwork which will be entered into your electronic medical record.  These signatures attest to the fact that that the information above on your After Visit Summary has been reviewed and is understood.  Full responsibility of the confidentiality of this discharge information lies with you and/or your care-partner.

## 2020-04-04 NOTE — Progress Notes (Signed)
No problems noted in the recovery room. Maw  I asked pt if he would like me to assist him dressing and he asked I bring his wife back to help.  maw

## 2020-04-04 NOTE — Progress Notes (Signed)
Pt's states no medical or surgical changes since previsit or office visit. ° ° °Vitals BC °

## 2020-04-05 ENCOUNTER — Ambulatory Visit: Payer: HMO | Admitting: Family Medicine

## 2020-04-08 ENCOUNTER — Telehealth: Payer: Self-pay | Admitting: *Deleted

## 2020-04-08 ENCOUNTER — Telehealth: Payer: Self-pay

## 2020-04-08 NOTE — Telephone Encounter (Signed)
Thank you.  Please try to verify this with the patient.  It is not just about his medications-I need to know if he needs refills on his pen needles, etc.

## 2020-04-08 NOTE — Telephone Encounter (Signed)
Attempted to contact pt to find out which medications it is that he needs refilled. I called several times but received a message that my call could not be completed.

## 2020-04-08 NOTE — Telephone Encounter (Signed)
Called [phone] and left a message we tried to reach pt for a follow up call. maw

## 2020-04-08 NOTE — Telephone Encounter (Signed)
No answer for post procedure call back. Left VM. 

## 2020-04-09 NOTE — Telephone Encounter (Signed)
Spoke with pt and he stated that he does not need any pen needles at this time and no meds refilled either.  [name]

## 2020-04-10 MED ORDER — FREESTYLE LIBRE 14 DAY SENSOR MISC: 1.0000 | 3 refills | Status: DC

## 2020-04-10 MED ORDER — BD PEN NEEDLE MINI U/F 31G X 5 MM MISC
1.0000 | Freq: Every day | 3 refills | Status: DC
Start: 2020-04-10 — End: 2021-04-11

## 2020-04-10 MED ORDER — GEMFIBROZIL 600 MG PO TABS: 600.0000 mg | ORAL_TABLET | Freq: Two times a day (BID) | ORAL | 3 refills | Status: DC

## 2020-04-10 MED ORDER — TOUJEO MAX SOLOSTAR 300 UNIT/ML ~~LOC~~ SOPN
PEN_INJECTOR | SUBCUTANEOUS | 3 refills | Status: DC
Start: 2020-04-10 — End: 2020-07-11

## 2020-04-10 MED ORDER — ATORVASTATIN CALCIUM 10 MG PO TABS: ORAL_TABLET | ORAL | 3 refills | Status: DC

## 2020-04-10 MED ORDER — ALLOPURINOL 300 MG PO TABS: 150.0000 mg | ORAL_TABLET | Freq: Every morning | ORAL | 3 refills | Status: DC

## 2020-04-10 NOTE — Addendum Note (Signed)
Addended by: Tonia Ghent on: 04/10/2020 01:25 PM   Modules accepted: Orders

## 2020-04-10 NOTE — Telephone Encounter (Signed)
Done. Thanks.

## 2020-04-10 NOTE — Telephone Encounter (Signed)
Patient is not currently out of medications. Refills needed for coordination of medication sync and adherence packaging. Please send the following to [facility]: Atorvastatin, Allopurinol, Libre sensors, Gemfibrozil, Toujeo, Insulin Pen Needles  Debbora Dus, PharmD Clinical Pharmacist [facility] at [facility] ([place]) [phone]

## 2020-04-12 ENCOUNTER — Encounter: Payer: Self-pay | Admitting: Family Medicine

## 2020-04-12 ENCOUNTER — Ambulatory Visit (INDEPENDENT_AMBULATORY_CARE_PROVIDER_SITE_OTHER): Payer: HMO | Admitting: Family Medicine

## 2020-04-12 ENCOUNTER — Other Ambulatory Visit: Payer: Self-pay

## 2020-04-12 VITALS — BP 132/80 | HR 83 | Temp 97.5°F | Ht 72.0 in | Wt 263.0 lb

## 2020-04-12 DIAGNOSIS — E669 Obesity, unspecified: Secondary | ICD-10-CM

## 2020-04-12 DIAGNOSIS — E1169 Type 2 diabetes mellitus with other specified complication: Secondary | ICD-10-CM

## 2020-04-12 LAB — POCT GLYCOSYLATED HEMOGLOBIN (HGB A1C): Hemoglobin A1C: 9.7 % — AB (ref 4.0–5.6)

## 2020-04-12 MED ORDER — GENTAMICIN SULFATE 0.1 % EX CREA: 1.0000 "application " | TOPICAL_CREAM | Freq: Three times a day (TID) | CUTANEOUS | 1 refills | Status: DC

## 2020-04-12 NOTE — Progress Notes (Signed)
This visit occurred during the SARS-CoV-2 public health emergency.  Safety protocols were in place, including screening questions prior to the visit, additional usage of staff PPE, and extensive cleaning of exam room while observing appropriate contact time as indicated for disinfecting solutions.  Diabetes:  Using medications without difficulties: yes Hypoglycemic episodes: no Hyperglycemic episodes:no Feet problems: no tingling.   Blood Sugars averaging: see below.   eye exam within last year:yes A1c 9.7 Sugar ~130s recently, 129 this AM.  He'll update me about his sugars later in the day.  I need to know if he is having significant sugar elevation later in the day that would drag his A1c up.  He had covid vaccine and booster.  covid considerations d/w pt.    I told the patient the need to talk to Dr. Ellene Route about his A1c.  He still is walking with a cane and still with sig pain.  Walking distance is still limited.  I do not know at what level his A1c elevation would preclude surgery.  I need neurosurgery input.  Chapped skin on the gluteal crease, better with gent cream, he needed a refill.  rx sent.    Recent echo d/w pt.  No CP.    1. Left ventricular ejection fraction, by estimation, is 55 to 60%. The  left ventricle has normal function. The left ventricle has no regional  wall motion abnormalities. Left ventricular diastolic parameters are  consistent with Grade I diastolic  dysfunction (impaired relaxation).  2. Right ventricular systolic function is normal. The right ventricular  size is normal.  3. The mitral valve is normal in structure. No evidence of mitral valve  regurgitation.  4. The aortic valve is tricuspid. Aortic valve regurgitation is not  visualized. Mild aortic valve sclerosis is present, with no evidence of  aortic valve stenosis.  5. Aortic dilatation noted. There is mild dilatation of the ascending  aorta, measuring 40 mm.  6. The inferior vena cava  is normal in size with greater than 50%  respiratory variability, suggesting right atrial pressure of 3 mmHg.   He has noted that his grip in his right hand has been weaker ever since he had his previous IV placed for his echo but this appears to be getting better slowly in the meantime.  He does not have similar right foot symptoms.  He does not have global right-sided weakness.  PMH and SH reviewed  Meds, vitals, and allergies reviewed.   ROS: Per HPI unless specifically indicated in ROS section   GEN: nad, alert and oriented HEENT: NCAT.  NECK: supple w/o LA CV: rrr. PULM: ctab, no inc wob ABD: soft, +bs EXT: no edema SKIN: no acute rash but he does have chronic changes in the gluteal crease bilaterally.  No fluctuant mass on exam.  No ulcer.   Right hand with intact flexion and extension but weaker compared to the left hand

## 2020-04-12 NOTE — Patient Instructions (Addendum)
Overall good news on the echo. Your hear muscle is slightly stiffer than normal but this is a mild change. There is mild dilatation of the ascending aorta. You have mild change in the aortic valve. Treatment for all three is blood pressure control. We can recheck this echo in about 1 year.   Please update me about your sugars later in the day.  I need to know about that.    Use the cream and try to limit sitting.     Plan on recheck in about 3 months but we will be in touch in the meantime.   Take care.  Glad to see you.

## 2020-04-16 ENCOUNTER — Telehealth: Payer: Self-pay | Admitting: Family Medicine

## 2020-04-16 NOTE — Assessment & Plan Note (Signed)
His diabetes complicates his back pain in terms of preoperative evaluation, with elevated A1c noted.  His back pain limits his ability to exercise which then influences his sugar.  Discussed.  His ability to get up and move around also affects skin healing on his gluteal crease.  He is going start checking his sugar in the afternoons and update me about that.  I will ask for input from Dr. Ellene Route.  See following phone note.  I talked with him about trying to limit weight/pressure on his gluteal crease and using gentamicin cream as needed topically, since that it helped previously.  We did not change his medications otherwise the visit today.  I will await update from patient and we will go from there.  He agrees with plan.  See after visit summary.  40 minutes were devoted to patient care in this encounter (this includes time spent reviewing the patient's file/history, interviewing and examining the patient, counseling/reviewing plan with patient).

## 2020-04-16 NOTE — Telephone Encounter (Signed)
Please call Dr. Clar[facility] clinic.  I need to know about operative planning for this patient.    His A1c is 9.7.  How low to we have to get his A1c for him to be able to go through with surgery?  How long is the patient expected to be inpatient after surgery?  Does Dr. Ellene Route have a suggestion regarding postop placement for rehab?   Please let me know about the 3 questions and we will go from there.  I greatly appreciate his help.

## 2020-04-17 ENCOUNTER — Telehealth: Payer: Self-pay

## 2020-04-17 NOTE — Telephone Encounter (Signed)
Agreed, thanks.  Please send me the paperwork and let me know if I need to send a new rx.

## 2020-04-17 NOTE — Telephone Encounter (Signed)
PCP consult CCM visit 04/02/20:  I recommend increasing Trulicity to 3 mg. We discussed this at [facility] visit and he was comfortable increasing the dose. Denies any nausea. Unlikely he will have hypoglycemia considering his A1c is 9.7% but we could lower his insulin dose to be cautious. My assistant will be following his home BG readings weekly until we can get better control. We will need to complete the prescription page of the patient assistance form and fax to [employer]. I will complete this for you and put in your office for signature if you would like to proceed.  Thanks,  Debbora Dus, PharmD Clinical Pharmacist [facility] at [facility] [phone]

## 2020-04-18 DIAGNOSIS — N3941 Urge incontinence: Secondary | ICD-10-CM | POA: Diagnosis not present

## 2020-04-18 DIAGNOSIS — R350 Frequency of micturition: Secondary | ICD-10-CM | POA: Diagnosis not present

## 2020-04-18 NOTE — Patient Instructions (Addendum)
Dear [name],  It was a pleasure meeting you during our initial appointment on April 02, 2020. Below is a summary of the goals we discussed and components of chronic care management. Please contact me anytime with questions or concerns.   Visit Information  Goals Addressed            This Visit's Progress   . Pharmacy Care Plan       CARE PLAN ENTRY (see longitudinal plan of care for additional care plan information)  Current Barriers:  . Chronic Disease Management support, education, and care coordination needs related to Hypertension and Diabetes   Hypertension BP Readings from Last 3 Encounters:  03/27/20 (!) 166/85  02/19/20 (!) 160/70  02/16/20 130/70   . Pharmacist Clinical Goal(s): o Over the next 3 months, patient will work with PharmD and providers to achieve BP goal <140/90 mmHg . Current regimen:   Losartan 100 mg - 1 tablet daily  Metoprolol Succinate ER - 1 tablet daily   Amlodipine 10 mg - 1 tablet daily . Interventions: o Check daily for 7 days after purchase of new Omron BP monitor (arm cuff). o CMA will call for review of home BP readings . Patient self care activities - Over the next 3 months, patient will: o Check BP 7 days, document, and provide at future appointments o Ensure daily salt intake < 2300 mg/day  Diabetes Lab Results  Component Value Date/Time   HGBA1C 9.3 (A) 12/28/2019 11:49 AM   HGBA1C 9.7 (A) 10/19/2019 09:34 AM   HGBA1C 9.9 (H) 07/17/2019 10:28 AM   HGBA1C 9.6 (H) 07/15/2018 09:35 AM   . Pharmacist Clinical Goal(s): o Over the next 3 months, patient will work with PharmD and providers to achieve A1c goal <7% . Current regimen:   Farxiga 5 mg - 1 tablet daily   Toujeo Max Solostar 300 unit/mL - 170 units every morning   Trulicity 1.5 mg - Inject once weekly  Cinnamon 200 mg daily . Interventions: o Reviewed Libre sensor application - let arm dry prior to application o Recommend increasing Trulicity to 3 mg  weekly . Patient self care activities - Over the next 3 months, patient will: o Check blood sugar 3-4 times daily, document, and provide at future appointments o Contact provider with any episodes of hypoglycemia  Medication management . Pharmacist Clinical Goal(s): o Over the next 3 months, patient will work with PharmD and providers to achieve optimal medication adherence . Current pharmacy: [facility] . Interventions o Comprehensive medication review performed. o Utilize [facility] pharmacy for medication synchronization, packaging and delivery . Patient self care activities - Over the next 3 months, patient will: o Coordinate with pharmacist to begin adherence packaging  o Take medications as prescribed o Report any questions or concerns to PharmD and/or provider(s)  Initial goal documentation       [name] was given information about Chronic Care Management services today including:  1. CCM service includes personalized support from designated clinical staff supervised by his physician, including individualized plan of care and coordination with other care providers 2. 24/7 contact phone numbers for assistance for urgent and routine care needs. 3. Standard insurance, coinsurance, copays and deductibles apply for chronic care management only during months in which we provide at least 20 minutes of these services. Most insurances cover these services at 100%, however patients may be responsible for any copay, coinsurance and/or deductible if applicable. This service may help you avoid the need  for more expensive face-to-face services. 4. Only one practitioner may furnish and bill the service in a calendar month. 5. The patient may stop CCM services at any time (effective at the end of the month) by phone call to the office staff.  Patient agreed to services and verbal consent obtained.   The patient verbalized understanding of instructions, educational materials,  and care plan provided today and agreed to receive a mailed copy of patient instructions, educational materials, and care plan.  Telephone follow up appointment with pharmacy team member scheduled for:  May 16, 2020 at 9 AM (PHONE VISIT)  Debbora Dus, PharmD Clinical Pharmacist [facility] at [facility] [phone]   Diabetes Mellitus and Nutrition, Adult When you have diabetes, or diabetes mellitus, it is very important to have healthy eating habits because your blood sugar (glucose) levels are greatly affected by what you eat and drink. Eating healthy foods in the right amounts, at about the same times every day, can help you:  Control your blood glucose.  Lower your risk of heart disease.  Improve your blood pressure.  Reach or maintain a healthy weight. What can affect my meal plan? Every person with diabetes is different, and each person has different needs for a meal plan. Your health care provider may recommend that you work with a dietitian to make a meal plan that is best for you. Your meal plan may vary depending on factors such as:  The calories you need.  The medicines you take.  Your weight.  Your blood glucose, blood pressure, and cholesterol levels.  Your activity level.  Other health conditions you have, such as heart or kidney disease. How do carbohydrates affect me? Carbohydrates, also called carbs, affect your blood glucose level more than any other type of food. Eating carbs naturally raises the amount of glucose in your blood. Carb counting is a method for keeping track of how many carbs you eat. Counting carbs is important to keep your blood glucose at a healthy level, especially if you use insulin or take certain oral diabetes medicines. It is important to know how many carbs you can safely have in each meal. This is different for every person. Your dietitian can help you calculate how many carbs you should have at each meal and for each  snack. How does alcohol affect me? Alcohol can cause a sudden decrease in blood glucose (hypoglycemia), especially if you use insulin or take certain oral diabetes medicines. Hypoglycemia can be a life-threatening condition. Symptoms of hypoglycemia, such as sleepiness, dizziness, and confusion, are similar to symptoms of having too much alcohol.  Do not drink alcohol if: ? Your health care provider tells you not to drink. ? You are pregnant, may be pregnant, or are planning to become pregnant.  If you drink alcohol: ? Do not drink on an empty stomach. ? Limit how much you use to:  0-1 drink a day for women.  0-2 drinks a day for men. ? Be aware of how much alcohol is in your drink. In the [place] one drink equals one 12 oz bottle of beer (355 mL), one 5 oz glass of wine (148 mL), or one 1 oz glass of hard liquor (44 mL). ? Keep yourself hydrated with water, diet soda, or unsweetened iced tea.  Keep in mind that regular soda, juice, and other mixers may contain a lot of sugar and must be counted as carbs. What are tips for following this plan? Reading food labels  Start by checking the serving size on the "Nutrition Facts" label of packaged foods and drinks. The amount of calories, carbs, fats, and other nutrients listed on the label is based on one serving of the item. Many items contain more than one serving per package.  Check the total grams (g) of carbs in one serving. You can calculate the number of servings of carbs in one serving by dividing the total carbs by 15. For example, if a food has 30 g of total carbs per serving, it would be equal to 2 servings of carbs.  Check the number of grams (g) of saturated fats and trans fats in one serving. Choose foods that have a low amount or none of these fats.  Check the number of milligrams (mg) of salt (sodium) in one serving. Most people should limit total sodium intake to less than 2,300 mg per day.  Always check the nutrition  information of foods labeled as "low-fat" or "nonfat." These foods may be higher in added sugar or refined carbs and should be avoided.  Talk to your dietitian to identify your daily goals for nutrients listed on the label. Shopping  Avoid buying canned, pre-made, or processed foods. These foods tend to be high in fat, sodium, and added sugar.  Shop around the outside edge of the grocery store. This is where you will most often find fresh fruits and vegetables, bulk grains, fresh meats, and fresh dairy. Cooking  Use low-heat cooking methods, such as baking, instead of high-heat cooking methods like deep frying.  Cook using healthy oils, such as olive, canola, or sunflower oil.  Avoid cooking with butter, cream, or high-fat meats. Meal planning  Eat meals and snacks regularly, preferably at the same times every day. Avoid going long periods of time without eating.  Eat foods that are high in fiber, such as fresh fruits, vegetables, beans, and whole grains. Talk with your dietitian about how many servings of carbs you can eat at each meal.  Eat 4-6 oz (112-168 g) of lean protein each day, such as lean meat, chicken, fish, eggs, or tofu. One ounce (oz) of lean protein is equal to: ? 1 oz (28 g) of meat, chicken, or fish. ? 1 egg. ?  cup (62 g) of tofu.  Eat some foods each day that contain healthy fats, such as avocado, nuts, seeds, and fish.   What foods should I eat? Fruits Berries. Apples. Oranges. Peaches. Apricots. Plums. Grapes. Mango. Papaya. Pomegranate. Kiwi. Cherries. Vegetables Lettuce. Spinach. Leafy greens, including kale, chard, collard greens, and mustard greens. Beets. Cauliflower. Cabbage. Broccoli. Carrots. Green beans. Tomatoes. Peppers. Onions. Cucumbers. Brussels sprouts. Grains Whole grains, such as whole-wheat or whole-grain bread, crackers, tortillas, cereal, and pasta. Unsweetened oatmeal. Quinoa. Brown or wild rice. Meats and other proteins Seafood. Poultry  without skin. Lean cuts of poultry and beef. Tofu. Nuts. Seeds. Dairy Low-fat or fat-free dairy products such as milk, yogurt, and cheese. The items listed above may not be a complete list of foods and beverages you can eat. Contact a dietitian for more information. What foods should I avoid? Fruits Fruits canned with syrup. Vegetables Canned vegetables. Frozen vegetables with butter or cream sauce. Grains Refined white flour and flour products such as bread, pasta, snack foods, and cereals. Avoid all processed foods. Meats and other proteins Fatty cuts of meat. Poultry with skin. Breaded or fried meats. Processed meat. Avoid saturated fats. Dairy Full-fat yogurt, cheese, or milk. Beverages Sweetened drinks, such as soda or  iced tea. The items listed above may not be a complete list of foods and beverages you should avoid. Contact a dietitian for more information. Questions to ask a health care provider  Do I need to meet with a diabetes educator?  Do I need to meet with a dietitian?  What number can I call if I have questions?  When are the best times to check my blood glucose? Where to find more information:  American Diabetes Association: diabetes.org  Academy of Nutrition and Dietetics: www.eatright.[employer] of Diabetes and Digestive and Kidney Diseases: [employer]  Association of Diabetes Care and Education Specialists: www.diabeteseducator.org Summary  It is important to have healthy eating habits because your blood sugar (glucose) levels are greatly affected by what you eat and drink.  A healthy meal plan will help you control your blood glucose and maintain a healthy lifestyle.  Your health care provider may recommend that you work with a dietitian to make a meal plan that is best for you.  Keep in mind that carbohydrates (carbs) and alcohol have immediate effects on your blood glucose levels. It is important to count carbs and to use alcohol  carefully. This information is not intended to replace advice given to you by your health care provider. Make sure you discuss any questions you have with your health care provider. Document Revised: 02/21/2019 Document Reviewed: 02/21/2019 Elsevier Patient Education  2021 [employer].

## 2020-04-19 NOTE — Telephone Encounter (Signed)
Contacted pharmacy to confirm. Please fax new prescription for Trulicity 3 mg to [facility] Fax# [phone]. No paperwork needed.  Also spoke to patient today 04/19/20. He confirms understanding of Trulicity dose increase to 3 mg weekly. Denies questions/concerns. Reports freestyle libre sensors are working well now. He is checking BG every 2 hours. Kept a log but accidentally got thrown away. Reports fasting BG ranging 129-170 depending on evening meal. Before lunch readings 225-240. He will continue to keep log and CMA will call to review. No further action needed.   Debbora Dus, PharmD Clinical Pharmacist [facility] at [facility] [phone]

## 2020-04-21 MED ORDER — TRULICITY 3 MG/0.5ML ~~LOC~~ SOAJ: 3.0000 mg | SUBCUTANEOUS | 5 refills | Status: DC

## 2020-04-21 NOTE — Telephone Encounter (Signed)
Sent. Thanks.   

## 2020-04-25 DIAGNOSIS — R350 Frequency of micturition: Secondary | ICD-10-CM | POA: Diagnosis not present

## 2020-04-25 DIAGNOSIS — N3941 Urge incontinence: Secondary | ICD-10-CM | POA: Diagnosis not present

## 2020-04-30 DIAGNOSIS — H401111 Primary open-angle glaucoma, right eye, mild stage: Secondary | ICD-10-CM | POA: Diagnosis not present

## 2020-04-30 DIAGNOSIS — Z961 Presence of intraocular lens: Secondary | ICD-10-CM | POA: Diagnosis not present

## 2020-04-30 DIAGNOSIS — E119 Type 2 diabetes mellitus without complications: Secondary | ICD-10-CM | POA: Diagnosis not present

## 2020-04-30 DIAGNOSIS — H43813 Vitreous degeneration, bilateral: Secondary | ICD-10-CM | POA: Diagnosis not present

## 2020-04-30 DIAGNOSIS — H2511 Age-related nuclear cataract, right eye: Secondary | ICD-10-CM | POA: Diagnosis not present

## 2020-04-30 DIAGNOSIS — H401123 Primary open-angle glaucoma, left eye, severe stage: Secondary | ICD-10-CM | POA: Diagnosis not present

## 2020-04-30 DIAGNOSIS — H04123 Dry eye syndrome of bilateral lacrimal glands: Secondary | ICD-10-CM | POA: Diagnosis not present

## 2020-05-01 NOTE — Telephone Encounter (Signed)
I left a message on Dr. Wallene Huh nurses VM to call back

## 2020-05-01 NOTE — Telephone Encounter (Signed)
Katilyn from Dr Wallene Huh office called you back please return her call at [phone]

## 2020-05-02 DIAGNOSIS — R3915 Urgency of urination: Secondary | ICD-10-CM | POA: Diagnosis not present

## 2020-05-02 DIAGNOSIS — R350 Frequency of micturition: Secondary | ICD-10-CM | POA: Diagnosis not present

## 2020-05-02 NOTE — Telephone Encounter (Signed)
Spoke with Lauren at Dr. Wallene Huh office and she is sending a note to him with the questions asked

## 2020-05-02 NOTE — Telephone Encounter (Signed)
Left message for Dr. Wallene Huh nurse to call back

## 2020-05-03 ENCOUNTER — Telehealth: Payer: Self-pay

## 2020-05-03 NOTE — Telephone Encounter (Signed)
Returning phone call °

## 2020-05-03 NOTE — Chronic Care Management (AMB) (Addendum)
Chronic Care Management Pharmacy Assistant   Name: [name]  MRN: [id number] DOB: 12-18-1940  Reason for Encounter: Disease State- Blood glucose log update  PCP : Tonia Ghent, MD  Allergies:   Allergies  Allergen Reactions   Morphine And Related Nausea And Vomiting    Causes nausea & vomiting   Codeine Sulfate Other (See Comments)    intolerant   Metformin And Related Other (See Comments)    Gi intolerance, not an allergy    Medications: Outpatient Encounter Medications as of 05/03/2020  Medication Sig   allopurinol (ZYLOPRIM) 300 MG tablet Take 0.5 tablets (150 mg total) by mouth every morning.   amLODipine (NORVASC) 10 MG tablet TAKE 1 TABLET BY MOUTH DAILY   Ascorbic Acid (VITAMIN C WITH ROSE HIPS) 500 MG tablet Take 500 mg by mouth daily.   aspirin 81 MG tablet Take 81 mg by mouth daily.   atorvastatin (LIPITOR) 10 MG tablet TAKE 1 TABLET BY MOUTH EACH NIGHT AT BEDTIME   Blood Glucose Monitoring Suppl (ONE TOUCH ULTRA 2) w/Device KIT 1 each daily by Does not apply route.   CINNAMON PO Take 200 mg by mouth daily.   Continuous Blood Gluc Sensor (FREESTYLE LIBRE 14 DAY SENSOR) MISC 1 Device by Does not apply route every 14 (fourteen) days. Use daily PRN to check sugar. Dx E11.9 w/hyperglycemia requiring insulin   diclofenac sodium (VOLTAREN) 1 % GEL Uses PRN   dorzolamide-timolol (COSOPT) 22.3-6.8 MG/ML ophthalmic solution Place 1 drop into the left eye 2 (two) times daily.    Dulaglutide (TRULICITY) 3 PO/2.4MP SOPN Inject 3 mg as directed once a week.   FARXIGA 5 MG TABS tablet TAKE 1 TABLET BY MOUTH DAILY BEFORE BREAKFAST   gemfibrozil (LOPID) 600 MG tablet Take 1 tablet (600 mg total) by mouth 2 (two) times daily.   gentamicin cream (GARAMYCIN) 0.1 % Apply 1 application topically 3 (three) times daily.   glucose blood (ONE TOUCH ULTRA TEST) test strip Check blood sugar before and after each meal and as directed. Dx E11.9   insulin glargine, 2 Unit Dial, (TOUJEO  MAX SOLOSTAR) 300 UNIT/ML Solostar Pen 170 units injected in the AM   Insulin Pen Needle (B-D UF III MINI PEN NEEDLES) 31G X 5 MM MISC 1 each by Does not apply route daily. Use to administer insulin daily   latanoprost (XALATAN) 0.005 % ophthalmic solution Place 1 drop into both eyes at bedtime.   losartan (COZAAR) 100 MG tablet TAKE 1 TABLET BY MOUTH DAILY   meloxicam (MOBIC) 15 MG tablet Take 0.5-1 tablets (7.5-15 mg total) by mouth daily as needed for pain (with food).   methylcellulose (CITRUCEL) oral powder Take 1 packet by mouth daily.   metoprolol succinate (TOPROL-XL) 25 MG 24 hr tablet TAKE 1 TABLET BY MOUTH DAILY   Multiple Vitamins-Minerals (VITRUM 50+ SENIOR MULTI PO) Take 1 tablet by mouth daily.    Tadalafil 2.5 MG TABS TAKE 1 TABLET BY MOUTH DAILY   triamcinolone cream (KENALOG) 0.1 % Apply 1 application topically 2 (two) times daily as needed.   No facility-administered encounter medications on file as of 05/03/2020.    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   Dislocation, hip ([place]) 11/09/2014   DJD (degenerative joint disease) 11/06/2014   Primary osteoarthritis of right knee    Primary localized osteoarthritis of right hip    Spinal stenosis of lumbar region with radiculopathy    Diabetes mellitus type 2 in obese (HCC)    PONV (postoperative nausea and vomiting)    GERD (gastroesophageal  reflux disease)    Frequent urination at night    Kidney stones    Arthritis    Neuromuscular disorder ([place])    Benign prostatic hyperplasia with urinary obstruction 07/14/2012   Chronic calculus cholecystitis 12/31/2011   Gouty arthropathy 03/01/2009    Current antihyperglycemic regimen:  Farxiga 5 mg - 1 tablet daily  Toujeo Max Solostar 300 unit/mL - 170 units every morning  Trulicity 3 mg - Inject once weekly Cinnamon 200 mg daily  How often are you checking your blood sugar? twice daily  What are your blood sugars ranging? States blood sugars have been running anywhere from 150-275  Fasting: 05/03/20 - 179  After meals: 05/03/20- 222   Insulin usage:  Toujeo Max Solostar 300 unit/mL - 170 units every morning   Trulicity 3 mg - Inject once weekly   Follow-Up:  Pharmacist Review  Debbora Dus, CPP notified  Margaretmary Dys, [place] Assistant [phone]  Recent dose increase of Trulicity from 1.5 mg to 3 mg faxed to manufacturer 04/21/20. He may have just received the new dose this week. Will expect more improvement in BG over the next couple of weeks. Schedule CMA call for BG log again in 2 weeks.  Debbora Dus, PharmD Clinical Pharmacist [facility] [phone]

## 2020-05-05 NOTE — Telephone Encounter (Signed)
Please update patient.  I talked with Dr. Ellene Route on 05/03/2020.  I greatly appreciate Dr. Clarice Pole help.  In terms of diabetic control, recent readings/control are likely more important to Dr. Ellene Route and the patient's surgical outcome that his A1c.  He may be able to improve his sugar readings in the meantime but not yet have the progress show up with his A1c.  Let me know how his sugar is in the meantime.  In terms of postop planning, Dr. Ellene Route was hoping the patient would have approximately 24 hours inpatient and potentially go home with home health instead of requiring short-term rehab placement.

## 2020-05-05 NOTE — Telephone Encounter (Signed)
Ria Comment - Please call for follow up BG log in 2 weeks, thanks!

## 2020-05-06 NOTE — Telephone Encounter (Signed)
LMTCB

## 2020-05-08 NOTE — Telephone Encounter (Signed)
Spoke with patient about below message. Patient only had BS readings from yesterday and this am written down. BS yesterday am was 130 and pm was 300. BS this am was 130. Patient is going to take readings again tomorrow and Friday and call back with them. Patient states that if he eats anything other then a salad his numbers will spike.

## 2020-05-08 NOTE — Telephone Encounter (Signed)
Noted.  Will await update from patient.  

## 2020-05-09 DIAGNOSIS — R350 Frequency of micturition: Secondary | ICD-10-CM | POA: Diagnosis not present

## 2020-05-09 DIAGNOSIS — R3915 Urgency of urination: Secondary | ICD-10-CM | POA: Diagnosis not present

## 2020-05-13 ENCOUNTER — Telehealth: Payer: Self-pay | Admitting: Family Medicine

## 2020-05-13 NOTE — Telephone Encounter (Signed)
Pt calling to return the call

## 2020-05-13 NOTE — Telephone Encounter (Signed)
Patient called in to give some BS readings.   Yesterday morning BS was 170 02/12 - 239 mid morning 02/11 - 269 am, 262 mid afternoon and 237 evening

## 2020-05-14 NOTE — Telephone Encounter (Signed)
Call pt.  Please verify his current total toujeo dose.  If taking 170 units once a day, I would try splitting the dose in half and take it twice a day.  That would be 85 units twice a day.   Either way, verify his total daily dose first.    See how that does over the next few days and then update me.  We'll go from there.  Thanks.

## 2020-05-14 NOTE — Telephone Encounter (Signed)
LMTCB

## 2020-05-16 ENCOUNTER — Telehealth: Payer: HMO

## 2020-05-16 DIAGNOSIS — R350 Frequency of micturition: Secondary | ICD-10-CM | POA: Diagnosis not present

## 2020-05-16 DIAGNOSIS — R3915 Urgency of urination: Secondary | ICD-10-CM | POA: Diagnosis not present

## 2020-05-16 NOTE — Telephone Encounter (Signed)
Patient is taking 170 units of toujeo daily; advised patient to cut dose in half to 85 units BID. Patient agrees to do so and will call back next week with BS readings.

## 2020-05-16 NOTE — Telephone Encounter (Signed)
LMTCB

## 2020-05-16 NOTE — Progress Notes (Deleted)
   Current antihyperglycemic regimen:   Farxiga 5 mg - 1 tablet daily   Toujeo Max Solostar 300 unit/mL - 170 units every morning   Trulicity 3 mg - Inject once weekly  Cinnamon 200 mg daily   How often are you checking your blood sugar? twice daily   What are your blood sugars ranging? States blood sugars have been running anywhere from 150-275  ? Fasting: 05/03/20 - 179  ? After meals: 05/03/20- 222   Insulin usage:  Toujeo Max Solostar 300 unit/mL - 170 units every morning        Trulicity 3 mg - Inject once weekly   Follow-Up:  Pharmacist Review  Debbora Dus, CPP notified  Margaretmary Dys, [place] Assistant [phone]  Recent dose increase of Trulicity from 1.5 mg to 3 mg faxed to manufacturer 04/21/20. He may have just received the new dose this week. Will expect more improvement in BG over the next couple of weeks. Schedule CMA call for BG log again in 2 weeks.

## 2020-05-17 NOTE — Telephone Encounter (Signed)
Noted. Thanks.

## 2020-05-17 NOTE — Chronic Care Management (AMB) (Addendum)
Patient returned called. States he is going to have to start monitoring his blood sugar with another meter. States his current meter will read 169 and he will check it again within a few minutes and it reads 399. Offered to have order for new meter sent in but patient denied. States he has another meter at home. Will follow up with patient 05/24/20.  Follow-Up:  Pharmacist Review  Debbora Dus, CPP notified  Margaretmary Dys, [facility] Assistant [phone]

## 2020-05-17 NOTE — Chronic Care Management (AMB) (Addendum)
Contacted patient for updated blood glucose log. Left message for patient to return call.    Follow-Up:  Pharmacist Review  Debbora Dus, CPP notified  Margaretmary Dys, [place] [facility] Assistant [phone]

## 2020-05-23 DIAGNOSIS — N3941 Urge incontinence: Secondary | ICD-10-CM | POA: Diagnosis not present

## 2020-05-23 DIAGNOSIS — R350 Frequency of micturition: Secondary | ICD-10-CM | POA: Diagnosis not present

## 2020-05-23 DIAGNOSIS — R3915 Urgency of urination: Secondary | ICD-10-CM | POA: Diagnosis not present

## 2020-05-24 NOTE — Chronic Care Management (AMB) (Addendum)
Attempted contact with patient to review BG readings. Call went to voicemail. Left message asking patient to return my call.   Follow-Up:  Pharmacist Review  Debbora Dus, CPP notified  Margaretmary Dys, [place] [facility] Assistant [phone]

## 2020-05-30 DIAGNOSIS — N3941 Urge incontinence: Secondary | ICD-10-CM | POA: Diagnosis not present

## 2020-05-30 DIAGNOSIS — R3915 Urgency of urination: Secondary | ICD-10-CM | POA: Diagnosis not present

## 2020-05-30 DIAGNOSIS — R350 Frequency of micturition: Secondary | ICD-10-CM | POA: Diagnosis not present

## 2020-06-04 ENCOUNTER — Ambulatory Visit (INDEPENDENT_AMBULATORY_CARE_PROVIDER_SITE_OTHER): Payer: HMO

## 2020-06-04 ENCOUNTER — Other Ambulatory Visit: Payer: Self-pay

## 2020-06-04 DIAGNOSIS — E1169 Type 2 diabetes mellitus with other specified complication: Secondary | ICD-10-CM

## 2020-06-04 DIAGNOSIS — E785 Hyperlipidemia, unspecified: Secondary | ICD-10-CM | POA: Diagnosis not present

## 2020-06-04 DIAGNOSIS — I10 Essential (primary) hypertension: Secondary | ICD-10-CM

## 2020-06-04 DIAGNOSIS — E669 Obesity, unspecified: Secondary | ICD-10-CM

## 2020-06-04 NOTE — Progress Notes (Addendum)
Chronic Care Management Pharmacy Note  06/06/2020 Name:  [name] MRN:  [id number] DOB:  12/02/1940  Subjective: [name] is an 80 y.o. year old male who is a primary patient of Damita Dunnings, Elveria Rising, MD.  The CCM team was consulted for assistance with disease management and care coordination needs.    Engaged with patient by telephone for follow up visit in response to provider referral for pharmacy case management and/or care coordination services.   Consent to Services:  The patient was given information about Chronic Care Management services, agreed to services, and gave verbal consent prior to initiation of services.  Please see initial visit note for detailed documentation.   Patient Care Team: Tonia Ghent, MD as PCP - General (Family Medicine) Clent Jacks, MD as Consulting Physician (Ophthalmology) Renato Shin, MD as Consulting Physician (Endocrinology) Armbruster, Carlota Raspberry, MD as Consulting Physician (Gastroenterology) Irene Limbo, MD as Consulting Physician (Plastic Surgery) Garvin Fila, MD as Consulting Physician (Neurology) Debbora Dus, [facility] as Pharmacist (Pharmacist)  Recent office visits: 05/13/20 - PCP recommended dividing insulin to 85 units BID due to high readings 04/17/20 - CCM note - Increase Trulicity to 3 mg weekly  04/12/20 - PCP - A1c 9.7%, Fasting 130s recently. Check post-prandial and update me. Echo reviewed. RTC 3 months.   Recent consult visits: 03/27/20 - Neurology - follow up visit for recurrent falls and weakness, surgery discussed for DJD 03/13/20 - Podiatry - follow up for diabetic neuropathy, replace diabetic shoes 02/16/20 - Gastroenterology - follow up for Altered bowel habits /history of colon polyps,patient with some alternating bowels as outlined above. Not really taking much of anything for this at present time. Recommend trial of fiber supplement every day to help regulate his bowels. He will try Citrucel once  daily and see if that helps. If this does not work he can contact me, if constipation persist may switch him to Soper. 45/80/99 - CCM - Trulicity PAP completed and sent to patient and provider for signature. Approved 02/27/20 for 12 months.  Hospital visits: None in previous 6 months  Objective:  Lab Results  Component Value Date   CREATININE 1.36 02/19/2020   BUN 32 (H) 02/19/2020   GFR 49.68 (L) 02/19/2020   GFRNONAA 40 (L) 12/12/2019   GFRAA 47 (L) 12/12/2019   NA 136 02/19/2020   K 4.1 02/19/2020   CALCIUM 10.1 02/19/2020   CO2 25 02/19/2020    Lab Results  Component Value Date/Time   HGBA1C 9.7 (A) 04/12/2020 10:11 AM   HGBA1C 9.3 (A) 12/28/2019 11:49 AM   HGBA1C 9.9 (H) 07/17/2019 10:28 AM   HGBA1C 9.6 (H) 07/15/2018 09:35 AM   GFR 49.68 (L) 02/19/2020 10:50 AM   GFR 51.46 (L) 11/22/2019 03:20 PM    Last diabetic Eye exam:  Lab Results  Component Value Date/Time   HMDIABEYEEXA No Retinopathy 01/01/2020 12:00 AM    Last diabetic Foot exam: 12/28/19 - PCP   Lab Results  Component Value Date   CHOL 179 07/17/2019   HDL 37.70 (L) 07/17/2019   LDLCALC 82 05/09/2014   LDLDIRECT 57.0 07/17/2019   TRIG (H) 07/17/2019    484.0 Triglyceride is over 400; calculations on Lipids are invalid.   CHOLHDL 5 07/17/2019    Hepatic Function Latest Ref Rng & Units 07/17/2019 07/15/2018 08/18/2016  Total Protein 6.0 - 8.3 g/dL 7.0 6.9 7.7  Albumin 3.5 - 5.2 g/dL 3.9 3.8 3.9  AST 0 - 37 U/L 38(H) 19 30  ALT 0 - 53 U/L 33 27 29  Alk Phosphatase 39 - 117 U/L 37(L) 36(L) 36(L)  Total Bilirubin 0.2 - 1.2 mg/dL 0.6 0.5 0.7    No results found for: TSH, FREET4  CBC Latest Ref Rng & Units 12/12/2019 07/15/2018 08/18/2016  WBC 4.0 - 10.5 K/uL 8.7 7.4 6.4  Hemoglobin 13.0 - 17.0 g/dL 14.6 14.4 15.0  Hematocrit 39.0 - 52.0 % 43.9 41.6 43.3  Platelets 150 - 400 K/uL 348 262.0 295    No results found for: VD25OH  Clinical ASCVD: No  The 10-year ASCVD risk score Mikey Bussing DC Jr., et  al., 2013) is: 60.8%   Values used to calculate the score:     Age: 52 years     Sex: Male     Is Non-Hispanic African American: No     Diabetic: Yes     Tobacco smoker: No     Systolic Blood Pressure: 539 mmHg     Is BP treated: Yes     HDL Cholesterol: 37.7 mg/dL     Total Cholesterol: 179 mg/dL    Depression screen [facility] 2/9 02/19/2020 12/15/2019 07/20/2019  Decreased Interest 0 0 0  Down, Depressed, Hopeless 0 0 0  PHQ - 2 Score 0 0 0  Altered sleeping - - -  Tired, decreased energy - - -  Change in appetite - - -  Feeling bad or failure about yourself  - - -  Trouble concentrating - - -  Moving slowly or fidgety/restless - - -  Suicidal thoughts - - -  PHQ-9 Score - - -  Difficult doing work/chores - - -  Some recent data might be hidden    Social History   Tobacco Use  Smoking Status Never Smoker  Smokeless Tobacco Never Used   BP Readings from Last 3 Encounters:  04/12/20 132/80  04/04/20 (!) 139/91  03/27/20 (!) 166/85   Pulse Readings from Last 3 Encounters:  04/12/20 83  04/04/20 75  03/27/20 90   Wt Readings from Last 3 Encounters:  04/12/20 263 lb (119.3 kg)  04/04/20 260 lb (117.9 kg)  03/27/20 265 lb (120.2 kg)    Assessment/Interventions: Review of patient past medical history, allergies, medications, health status, including review of consultants reports, laboratory and other test data, was performed as part of comprehensive evaluation and provision of chronic care management services.   SDOH:  (Social Determinants of Health) assessments and interventions performed: Yes SDOH Interventions   Flowsheet Row Most Recent Value  SDOH Interventions   Financial Strain Interventions Intervention Not Indicated  [Trulicity through patient assistance. Other medications affordable.]      CCM Care Plan  Allergies  Allergen Reactions  . Morphine And Related Nausea And Vomiting    Causes nausea & vomiting  . Codeine Sulfate Other (See Comments)     intolerant  . Metformin And Related Other (See Comments)    Gi intolerance, not an allergy    Medications Reviewed Today    Reviewed by Debbora Dus, [facility] (Pharmacist) on 06/06/20 at 1352  Med List Status: <None>  Medication Order Taking? Sig Documenting Provider Last Dose Status Informant  allopurinol (ZYLOPRIM) 300 MG tablet [id number] No Take 0.5 tablets (150 mg total) by mouth every morning. Tonia Ghent, MD Taking Active   amLODipine (NORVASC) 10 MG tablet [id number] No TAKE 1 TABLET BY MOUTH DAILY Tonia Ghent, MD Taking Active   Ascorbic Acid (VITAMIN C WITH ROSE HIPS) 500 MG tablet [id number] No Take 500  mg by mouth daily. [provider] Taking Active   aspirin 81 MG tablet [id number] No Take 81 mg by mouth daily. [provider] Taking Active Self  atorvastatin (LIPITOR) 10 MG tablet [id number] No TAKE 1 TABLET BY MOUTH EACH NIGHT AT BEDTIME Tonia Ghent, MD Taking Active   Blood Glucose Monitoring Suppl (ONE TOUCH ULTRA 2) w/Device KIT [id number] No 1 each daily by Does not apply route. Renato Shin, MD Taking Active   CINNAMON PO [id number] No Take 200 mg by mouth daily. [provider] Taking Active   Continuous Blood Gluc Sensor (FREESTYLE LIBRE 14 DAY SENSOR) MISC [id number] No 1 Device by Does not apply route every 14 (fourteen) days. Use daily PRN to check sugar. Dx E11.9 w/hyperglycemia requiring insulin Tonia Ghent, MD Taking Active   diclofenac sodium (VOLTAREN) 1 % GEL [id number] No Uses PRN [provider] Taking Active   dorzolamide-timolol (COSOPT) 22.3-6.8 MG/ML ophthalmic solution [id number] No Place 1 drop into the left eye 2 (two) times daily.  [provider] Taking Active   Dulaglutide (TRULICITY) 3 XA/1.2IN SOPN [id number]  Inject 3 mg as directed once a week. Tonia Ghent, MD  Active   FARXIGA 5 MG TABS tablet [id number] No TAKE 1 TABLET BY MOUTH DAILY BEFORE BREAKFAST Tonia Ghent, MD Taking Active    gemfibrozil (LOPID) 600 MG tablet [id number] No Take 1 tablet (600 mg total) by mouth 2 (two) times daily. Tonia Ghent, MD Taking Active   gentamicin cream (GARAMYCIN) 0.1 % [id number]  Apply 1 application topically 3 (three) times daily. Tonia Ghent, MD  Active   glucose blood (ONE TOUCH ULTRA TEST) test strip [id number] No Check blood sugar before and after each meal and as directed. Dx E11.9 Tonia Ghent, MD Taking Active   insulin glargine, 2 Unit Dial, (TOUJEO MAX SOLOSTAR) 300 UNIT/ML Solostar Pen [id number] No 170 units injected in the AM Tonia Ghent, MD Taking Active   Insulin Pen Needle (B-D UF III MINI PEN NEEDLES) 31G X 5 MM MISC [id number] No 1 each by Does not apply route daily. Use to administer insulin daily Tonia Ghent, MD Taking Active   latanoprost (XALATAN) 0.005 % ophthalmic solution [id number] No Place 1 drop into both eyes at bedtime. [provider] Taking Active Self  losartan (COZAAR) 100 MG tablet [id number] No TAKE 1 TABLET BY MOUTH DAILY Tonia Ghent, MD Taking Active   meloxicam (MOBIC) 15 MG tablet [id number] No Take 0.5-1 tablets (7.5-15 mg total) by mouth daily as needed for pain (with food). Tonia Ghent, MD Taking Active   methylcellulose (CITRUCEL) oral powder [id number] No Take 1 packet by mouth daily. Yetta Flock, MD Taking Active   metoprolol succinate (TOPROL-XL) 25 MG 24 hr tablet [id number] No TAKE 1 TABLET BY MOUTH DAILY Tonia Ghent, MD Taking Active   Multiple Vitamins-Minerals (VITRUM 50+ SENIOR MULTI PO) [id number] No Take 1 tablet by mouth daily.  [provider] Taking Active Self  Tadalafil 2.5 MG TABS [id number] No TAKE 1 TABLET BY MOUTH DAILY Tonia Ghent, MD Taking Active   triamcinolone cream (KENALOG) 0.1 % [id number] No Apply 1 application topically 2 (two) times daily as needed. Tonia Ghent, MD Taking Active           Patient Active Problem List   Diagnosis Date Noted  . Back pain  10/22/2019  . Fall at home 09/27/2019  . Advance care planning 07/25/2019  .  Pressure ulcer, buttock 04/17/2019  . Skin irritation 03/29/2019  . Gout 07/22/2018  . HTN (hypertension) 06/11/2017  . Hyperlipemia   . Lower urinary tract symptoms (LUTS) 03/04/2017  . Bell's palsy 10/11/2016  . Paresthesia 05/28/2016  . Medicare annual wellness visit, subsequent 04/22/2016  . Cough 04/22/2016  . Allergic rhinitis 03/12/2016  . 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 Erectile dysfunction 06/11/2015  . Rhinorrhea 03/13/2015  . 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 Dislocation, hip ([place]) 11/09/2014  . DJD (degenerative joint disease) 11/06/2014  . Primary osteoarthritis of right knee   . Primary localized osteoarthritis of right hip   . Spinal stenosis of lumbar region with radiculopathy   . Diabetes mellitus type 2 in obese ([place])   . PONV (postoperative nausea and vomiting)   . GERD (gastroesophageal reflux disease)   . Frequent urination at night   . Kidney stones   . Arthritis   . Neuromuscular disorder ([place])   . Benign prostatic hyperplasia with urinary obstruction 07/14/2012  . Chronic calculus cholecystitis 12/31/2011  . Gouty arthropathy 03/01/2009    Immunization History  Administered Date(s) Administered  . Fluad Quad(high Dose 65+) 12/28/2019  . Influenza Split 01/30/2009, 02/07/2010  . Influenza, High Dose Seasonal PF 12/17/2011, 01/14/2017  . Influenza,inj,Quad PF,6+ Mos 02/17/2016, 01/19/2018  . Influenza,inj,quad, With Preservative 12/01/2018  . Influenza,trivalent, recombinat, inj, PF 01/02/2011  . Influenza-Unspecified 03/11/2015, 12/01/2018  . PFIZER(Purple Top)SARS-COV-2 Vaccination 04/22/2019, 05/13/2019, 12/13/2019  . PPD Test 08/04/2008  . Pneumococcal Conjugate-13 02/07/2010, 03/26/2014  . Pneumococcal Polysaccharide-23 02/07/2010  . Td 04/04/2012  . Tdap 03/26/2006  . Zoster 03/30/2009    Conditions to be addressed/monitored:  Hypertension, Hyperlipidemia  and Diabetes  Care Plan : [place]  Updates made by Debbora Dus, [facility] since 06/06/2020 12:00 AM    Problem: CHL AMB "PATIENT-SPECIFIC PROBLEM"     Long-Range Goal: Disease Management   Start Date: 06/04/2020  Priority: High  Note:   Current Barriers:  . Unable to achieve control of diabetes   . Not keeping a log of BG readings  Pharmacist Clinical Goal(s):  [name] Over the next 30 days, patient will adhere to plan to optimize therapeutic regimen for diabetes as evidenced by report of adherence to recommended medication management changes through collaboration with PharmD and provider.  [name] Keep written log of BG readings twice daily   Interventions: . 1:1 collaboration with Tonia Ghent, MD regarding development and update of comprehensive plan of care as evidenced by provider attestation and co-signature . Inter-disciplinary care team collaboration (see longitudinal plan of care) . Comprehensive medication review performed; medication list updated in electronic medical record  Hypertension (BP goal <140/90) Query -Controlled -Current treatment:  Losartan 100 mg - 1 tablet daily  Metoprolol Succinate ER - 1 tablet daily   Amlodipine 10 mg - 1 tablet daily -Medications previously tried: none reported  -Current home readings: none reported -Denies hypotensive/hypertensive symptoms -Educated on Importance of home blood pressure monitoring; Proper BP monitoring technique; -Counseled to monitor BP at home weekly, document, and provide log at future appointments -Recommended to continue current medication  Hyperlipidemia: (LDL goal < 70) -Not ideally controlled - TG elevated -Current treatment:  Atorvastatin 10 mg - 1 tablet daily   Gemfibrozil 600 mg - 1 tablet BID  Aspirin 81 mg - 1 tablet daily -Medications previously tried: none - TG likely elevated due to uncontrolled diabetes. Work on diabetes control. Continues current therapy otherwise. No history of  statin intolerance  so we could increase statin and consider off gemfibrozil If TG come down. -Educated on Benefits of statin for ASCVD risk reduction; Weight loss -Recommended to continue current medication  Diabetes (A1c goal <8%) -Query uncontrolled - Goal is to bring sugar down for back surgery -Current medications:  Farxiga 5 mg - 1 tablet daily   Toujeo Max Solostar 300 unit/mL - 170 units every morning *Tried BID and forgot second dose, went back to QD*  Trulicity 3 mg - Inject once weekly on Sundays *Increased from 1.5 mg to 3 mg on 04/21/20*  Cinnamon 200 mg daily -Medications previously tried: Januvia (replaced with Trulicity), Metformin (GI intolerance) -Current home glucose readings - Freestyle Libre changed back to regular glucometer due to concern for inaccuracy He reports checking fasting BG daily. He will check again 2 hours after breakfast. Checking 5-6 times daily.  Fasting ranging 100-200 per memory  Post-prandial: 250-400  -Denies hypoglycemic/hyperglycemic symptoms -Current meal patterns:   Breakfast - varies, Glucerna or scrambled eggs (late morning)  Lunch - Glucerna or scrambled eggs, ham sandwich  Supper - pork, slaw, potatoes (watches portions)  Snacks - fruit (grapes)  Drinks - water, diet tea, diet pepsi, denies juice, milk (not daily) -Current exercise: walks some, limited by back pain -Reports trying to lose weight and watch portions. He does not have a scale. -Educated on Proper insulin injection technique; Injects in abdomen. Rotates site. Denies any hard spots.  -Counseled to check feet daily and get yearly eye exams -Recommended to continue current medication. Keep log of BG readings twice daily. CMA to review BG log monitoring.  UPON REVIEW - Pt did not increase Trulicity to 3 mg (dose was sent to wrong pharmacy by accident). Will have CMA send in updated dose of 3 mg weekly [facility].   Patient Goals/Self-Care  Activities . Over the next 30 days, patient will:  - check glucose before breakfast and 2 hours after meals, document, and provide at future appointments  Follow Up Plan: Face to Face appointment with care management team member scheduled for: July 02, 2020 at 2 PM [facility])  Medication Assistance: Trulicityobtained through [place] medication assistance program.  Enrollment ends end of 2022     Patient's preferred pharmacy is:  [facility] - [place], [place] - [address] [place] [address] Phone: [phone] Fax: [phone]  [place], [place] [address] Phone: [phone] Fax: [phone]  Care Plan and Follow Up Patient Decision:  Patient agrees to Care Plan and Follow-up.  Debbora Dus, PharmD Clinical Pharmacist [facility] at [facility] [phone]

## 2020-06-06 ENCOUNTER — Telehealth: Payer: Self-pay

## 2020-06-06 DIAGNOSIS — R3915 Urgency of urination: Secondary | ICD-10-CM | POA: Diagnosis not present

## 2020-06-06 DIAGNOSIS — R350 Frequency of micturition: Secondary | ICD-10-CM | POA: Diagnosis not present

## 2020-06-06 DIAGNOSIS — N3941 Urge incontinence: Secondary | ICD-10-CM | POA: Diagnosis not present

## 2020-06-06 NOTE — Chronic Care Management (AMB) (Signed)
Contact Mr. Ritchie to request he keep detailed log of his blood sugar readings. Asked him to check before eating or drinking anything first thing in the morning and 2 hours after evening meal. Patient agreed to this. Set up follow up for next Friday 06/14/20 to review updated readings.   Follow-Up:  Pharmacist Review  Debbora Dus, CPP notified  Margaretmary Dys, [place] [phone]  Total time spent for month: 24

## 2020-06-06 NOTE — Telephone Encounter (Signed)
Trulicity 3 mg was sent to incorrect pharmacy by accident last month so he has continued Trulicity 1.5 mg. Please send refill for Trulicity 3 mg to [facility]) by fax - [phone] or verbal [phone]. They do not accept e-scripts.  Debbora Dus, PharmD Clinical Pharmacist [facility] at [facility] [phone]

## 2020-06-06 NOTE — Patient Instructions (Signed)
Dear [name],  Below is a summary of the goals we discussed during our follow up appointment on June 04, 2020. Please contact me anytime with questions or concerns.   Visit Information   Patient Care Plan: CCM Pharmacy Care Plan    Problem Identified: CHL AMB "PATIENT-SPECIFIC PROBLEM"     Long-Range Goal: Disease Management   Start Date: 06/04/2020  Priority: High  Note:   Current Barriers:  . Unable to achieve control of diabetes   . Not keeping a log of BG readings  Pharmacist Clinical Goal(s):  [name] Over the next 30 days, patient will adhere to plan to optimize therapeutic regimen for diabetes as evidenced by report of adherence to recommended medication management changes through collaboration with PharmD and provider.  [name] Keep written log of BG readings twice daily   Interventions: . 1:1 collaboration with Tonia Ghent, MD regarding development and update of comprehensive plan of care as evidenced by provider attestation and co-signature . Inter-disciplinary care team collaboration (see longitudinal plan of care) . Comprehensive medication review performed; medication list updated in electronic medical record  Hypertension (BP goal <140/90) Query -Controlled -Current treatment:  Losartan 100 mg - 1 tablet daily  Metoprolol Succinate ER - 1 tablet daily   Amlodipine 10 mg - 1 tablet daily -Medications previously tried: none reported  -Current home readings: none reported -Denies hypotensive/hypertensive symptoms -Educated on Importance of home blood pressure monitoring; Proper BP monitoring technique; -Counseled to monitor BP at home weekly, document, and provide log at future appointments -Recommended to continue current medication  Hyperlipidemia: (LDL goal < 70) -Not ideally controlled - TG elevated -Current treatment:  Atorvastatin 10 mg - 1 tablet daily   Gemfibrozil 600 mg - 1 tablet BID  Aspirin 81 mg - 1 tablet daily -Medications previously  tried: none - TG likely elevated due to uncontrolled diabetes. Work on diabetes control. Continues current therapy otherwise. No history of statin intolerance so we could increase statin and consider off gemfibrozil If TG come down. -Educated on Benefits of statin for ASCVD risk reduction; Weight loss -Recommended to continue current medication  Diabetes (A1c goal <8%) -Query uncontrolled - Goal is to bring sugar down for back surgery -Current medications:  Farxiga 5 mg - 1 tablet daily   Toujeo Max Solostar 300 unit/mL - 170 units every morning *Tried BID and forgot second dose, went back to QD*  Trulicity 3 mg - Inject once weekly on Sundays *Increased to 3 mg on 04/21/20*  Cinnamon 200 mg daily -Medications previously tried: Januvia (replaced with Trulicity), Metformin (GI intolerance) -Current home glucose readings - Freestyle Libre changed back to regular glucometer due to concern for inaccuracy He reports checking fasting BG daily. He will check again 2 hours after breakfast. Checking 5-6 times daily.  Fasting ranging 100-200 per memory  Post-prandial: 250-400  -Denies hypoglycemic/hyperglycemic symptoms -Current meal patterns:   Breakfast - varies, Glucerna or scrambled eggs (late morning)  Lunch - Glucerna or scrambled eggs, ham sandwich  Supper - pork, slaw, potatoes (watches portions)  Snacks - fruit (grapes)  Drinks - water, diet tea, diet pepsi, denies juice, milk (not daily) -Current exercise: walks some, limited by back pain -Reports trying to lose weight and watch portions. He does not have a scale. -Educated on Proper insulin injection technique; Injects in abdomen. Rotates site. Denies any hard spots.  -Counseled to check feet daily and get yearly eye exams -Recommended to continue current medication Recommend trial Trulicity 4.5 mg weekly.  Keep log of BG readings twice daily. CMA to review BG log monitoring.  Patient Goals/Self-Care Activities . Over the  next 30 days, patient will:  - check glucose before breakfast and 2 hours after meals, document, and provide at future appointments  Follow Up Plan: Face to Face appointment with care management team member scheduled for: July 02, 2020 at 2 PM [facility])  Medication Assistance: Trulicityobtained through [employer] medication assistance program.  Enrollment ends end of 2022      The patient verbalized understanding of instructions, educational materials, and care plan provided today and declined offer to receive copy of patient instructions, educational materials, and care plan.   Debbora Dus, PharmD Clinical Pharmacist [facility] at [facility] [phone]

## 2020-06-06 NOTE — Telephone Encounter (Signed)
Please call patient and let him know I would him to keep log of BG readings on paper. I would like him to check his BG twice daily to simplify schedule. Check the same time each day -- before breakfast and 2 hours after first bite of evening meal (and always with any symptoms of hypoglycemia). Set follow up call for 1 week to review with you.   Thanks,  Debbora Dus, PharmD Clinical Pharmacist [place] [facility] at [facility] [phone]

## 2020-06-06 NOTE — Chronic Care Management (AMB) (Signed)
Contacted [facility] at [phone] to inquire about dose of Trulicity on file. They currently have Trulicity 1.5 mg on file for patient. If change in dose needed she advised to fax to [phone]   Follow-Up:  Patient Assistance Coordination and Pharmacist Review  Debbora Dus, CPP notified  Margaretmary Dys, [place] [facility] Assistant [phone]

## 2020-06-07 ENCOUNTER — Ambulatory Visit: Payer: HMO | Admitting: Podiatry

## 2020-06-07 ENCOUNTER — Encounter: Payer: Self-pay | Admitting: Podiatry

## 2020-06-07 ENCOUNTER — Other Ambulatory Visit: Payer: Self-pay

## 2020-06-07 DIAGNOSIS — E1149 Type 2 diabetes mellitus with other diabetic neurological complication: Secondary | ICD-10-CM | POA: Diagnosis not present

## 2020-06-07 DIAGNOSIS — E1140 Type 2 diabetes mellitus with diabetic neuropathy, unspecified: Secondary | ICD-10-CM

## 2020-06-07 DIAGNOSIS — L84 Corns and callosities: Secondary | ICD-10-CM

## 2020-06-07 MED ORDER — TRULICITY 3 MG/0.5ML ~~LOC~~ SOAJ: 3.0000 mg | SUBCUTANEOUS | 1 refills | Status: DC

## 2020-06-07 NOTE — Telephone Encounter (Signed)
Called in Trulicity 3 mg in to [facility] and updated chart.

## 2020-06-09 NOTE — Progress Notes (Signed)
Subjective:   Patient ID: [name], male   DOB: 80 y.o.   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

## 2020-06-11 ENCOUNTER — Telehealth: Payer: Self-pay

## 2020-06-11 NOTE — Chronic Care Management (AMB) (Signed)
° ° °  Chronic Care Management Pharmacy Assistant   Name: [name]  MRN: [id number] DOB: 1941/02/06   Reason for Encounter: Pharmacy Inquiry     Medications: Outpatient Encounter Medications as of 06/11/2020  Medication Sig   allopurinol (ZYLOPRIM) 300 MG tablet Take 0.5 tablets (150 mg total) by mouth every morning.   amLODipine (NORVASC) 10 MG tablet TAKE 1 TABLET BY MOUTH DAILY   Ascorbic Acid (VITAMIN C WITH ROSE HIPS) 500 MG tablet Take 500 mg by mouth daily.   aspirin 81 MG tablet Take 81 mg by mouth daily.   atorvastatin (LIPITOR) 10 MG tablet TAKE 1 TABLET BY MOUTH EACH NIGHT AT BEDTIME   Blood Glucose Monitoring Suppl (ONE TOUCH ULTRA 2) w/Device KIT 1 each daily by Does not apply route.   CINNAMON PO Take 200 mg by mouth daily.   Continuous Blood Gluc Sensor (FREESTYLE LIBRE 14 DAY SENSOR) MISC 1 Device by Does not apply route every 14 (fourteen) days. Use daily PRN to check sugar. Dx E11.9 w/hyperglycemia requiring insulin   diclofenac sodium (VOLTAREN) 1 % GEL Uses PRN   dorzolamide-timolol (COSOPT) 22.3-6.8 MG/ML ophthalmic solution Place 1 drop into the left eye 2 (two) times daily.    Dulaglutide (TRULICITY) 3 YY/4.8GN SOPN Inject 3 mg as directed once a week.   FARXIGA 5 MG TABS tablet TAKE 1 TABLET BY MOUTH DAILY BEFORE BREAKFAST   gemfibrozil (LOPID) 600 MG tablet Take 1 tablet (600 mg total) by mouth 2 (two) times daily.   gentamicin cream (GARAMYCIN) 0.1 % Apply 1 application topically 3 (three) times daily.   glucose blood (ONE TOUCH ULTRA TEST) test strip Check blood sugar before and after each meal and as directed. Dx E11.9   insulin glargine, 2 Unit Dial, (TOUJEO MAX SOLOSTAR) 300 UNIT/ML Solostar Pen 170 units injected in the AM   Insulin Pen Needle (B-D UF III MINI PEN NEEDLES) 31G X 5 MM MISC 1 each by Does not apply route daily. Use to administer insulin daily   latanoprost (XALATAN) 0.005 % ophthalmic solution Place 1 drop into both eyes  at bedtime.   losartan (COZAAR) 100 MG tablet TAKE 1 TABLET BY MOUTH DAILY   meloxicam (MOBIC) 15 MG tablet Take 0.5-1 tablets (7.5-15 mg total) by mouth daily as needed for pain (with food).   methylcellulose (CITRUCEL) oral powder Take 1 packet by mouth daily.   metoprolol succinate (TOPROL-XL) 25 MG 24 hr tablet TAKE 1 TABLET BY MOUTH DAILY   Multiple Vitamins-Minerals (VITRUM 50+ SENIOR MULTI PO) Take 1 tablet by mouth daily.    Tadalafil 2.5 MG TABS TAKE 1 TABLET BY MOUTH DAILY   triamcinolone cream (KENALOG) 0.1 % Apply 1 application topically 2 (two) times daily as needed.   No facility-administered encounter medications on file as of 06/11/2020.    Contacted Mr. Waybright on behalf of [facility]. Received a call that patient would like to transfer medications to [facility]. Attempted to reach Mr. Swicegood multiple times to confirm. Unable to reach patient.    Follow-Up:  Pharmacist Review  Debbora Dus, CPP notified  Margaretmary Dys, [place] [phone]  Total time spent for month: 40

## 2020-06-13 DIAGNOSIS — R350 Frequency of micturition: Secondary | ICD-10-CM | POA: Diagnosis not present

## 2020-06-13 DIAGNOSIS — N3941 Urge incontinence: Secondary | ICD-10-CM | POA: Diagnosis not present

## 2020-06-17 ENCOUNTER — Other Ambulatory Visit: Payer: Self-pay

## 2020-06-17 ENCOUNTER — Other Ambulatory Visit: Payer: Self-pay | Admitting: Family Medicine

## 2020-06-17 MED ORDER — DAPAGLIFLOZIN PROPANEDIOL 5 MG PO TABS: 5.0000 mg | ORAL_TABLET | Freq: Every day | ORAL | 5 refills | Status: DC

## 2020-06-20 DIAGNOSIS — R3915 Urgency of urination: Secondary | ICD-10-CM | POA: Diagnosis not present

## 2020-06-20 DIAGNOSIS — N3941 Urge incontinence: Secondary | ICD-10-CM | POA: Diagnosis not present

## 2020-06-20 DIAGNOSIS — R350 Frequency of micturition: Secondary | ICD-10-CM | POA: Diagnosis not present

## 2020-06-24 ENCOUNTER — Other Ambulatory Visit: Payer: Self-pay | Admitting: Family Medicine

## 2020-06-27 DIAGNOSIS — N3941 Urge incontinence: Secondary | ICD-10-CM | POA: Diagnosis not present

## 2020-07-01 NOTE — Progress Notes (Deleted)
Chronic Care Management Pharmacy Note  07/01/2020 Name:  [name] MRN:  [id number] DOB:  1940/10/30  Subjective: [name] is an 80 y.o. year old male who is a primary patient of Damita Dunnings, Elveria Rising, MD.  The CCM team was consulted for assistance with disease management and care coordination needs.    Engaged with patient by telephone for follow up visit in response to provider referral for pharmacy case management and/or care coordination services.   Consent to Services:  The patient was given information about Chronic Care Management services, agreed to services, and gave verbal consent prior to initiation of services.  Please see initial visit note for detailed documentation.   Patient Care Team: Tonia Ghent, MD as PCP - General (Family Medicine) Clent Jacks, MD as Consulting Physician (Ophthalmology) Renato Shin, MD as Consulting Physician (Endocrinology) Armbruster, Carlota Raspberry, MD as Consulting Physician (Gastroenterology) Irene Limbo, MD as Consulting Physician (Plastic Surgery) Garvin Fila, MD as Consulting Physician (Neurology) Debbora Dus, [facility] as Pharmacist (Pharmacist)  Recent office visits: 06/04/20 - CCM - Pt had not increased Trulicity, resent Rx for 3 mg to [facility]. Pt instructed to write down BG log for 2 weeks and bring to face to face appointment. 05/13/20 - PCP recommended dividing insulin to 85 units BID due to high readings 04/17/20 - CCM note - Increase Trulicity to 3 mg weekly  04/12/20 - PCP - A1c 9.7%, Fasting 130s recently. Check post-prandial and update me. Echo reviewed. RTC 3 months.   Recent consult visits: 03/27/20 - Neurology - follow up visit for recurrent falls and weakness, surgery discussed for DJD 03/13/20 - Podiatry - follow up for diabetic neuropathy, replace diabetic shoes 02/16/20 - Gastroenterology - follow up for Altered bowel habits /history of colon polyps,patient with some alternating bowels as outlined  above. Not really taking much of anything for this at present time. Recommend trial of fiber supplement every day to help regulate his bowels. He will try Citrucel once daily and see if that helps. If this does not work he can contact me, if constipation persist may switch him to [place]. 41/32/44 - CCM - Trulicity PAP completed and sent to patient and provider for signature. Approved 02/27/20 for 12 months.  Hospital visits: None in previous 6 months  Objective:  Lab Results  Component Value Date   CREATININE 1.36 02/19/2020   BUN 32 (H) 02/19/2020   GFR 49.68 (L) 02/19/2020   GFRNONAA 40 (L) 12/12/2019   GFRAA 47 (L) 12/12/2019   NA 136 02/19/2020   K 4.1 02/19/2020   CALCIUM 10.1 02/19/2020   CO2 25 02/19/2020    Lab Results  Component Value Date/Time   HGBA1C 9.7 (A) 04/12/2020 10:11 AM   HGBA1C 9.3 (A) 12/28/2019 11:49 AM   HGBA1C 9.9 (H) 07/17/2019 10:28 AM   HGBA1C 9.6 (H) 07/15/2018 09:35 AM   GFR 49.68 (L) 02/19/2020 10:50 AM   GFR 51.46 (L) 11/22/2019 03:20 PM    Last diabetic Eye exam:  Lab Results  Component Value Date/Time   HMDIABEYEEXA No Retinopathy 01/01/2020 12:00 AM    Last diabetic Foot exam: 12/28/19 - PCP   Lab Results  Component Value Date   CHOL 179 07/17/2019   HDL 37.70 (L) 07/17/2019   LDLCALC 82 05/09/2014   LDLDIRECT 57.0 07/17/2019   TRIG (H) 07/17/2019    484.0 Triglyceride is over 400; calculations on Lipids are invalid.   CHOLHDL 5 07/17/2019    Hepatic Function Latest Ref  Rng & Units 07/17/2019 07/15/2018 08/18/2016  Total Protein 6.0 - 8.3 g/dL 7.0 6.9 7.7  Albumin 3.5 - 5.2 g/dL 3.9 3.8 3.9  AST 0 - 37 U/L 38(H) 19 30  ALT 0 - 53 U/L 33 27 29  Alk Phosphatase 39 - 117 U/L 37(L) 36(L) 36(L)  Total Bilirubin 0.2 - 1.2 mg/dL 0.6 0.5 0.7    No results found for: TSH, FREET4  CBC Latest Ref Rng & Units 12/12/2019 07/15/2018 08/18/2016  WBC 4.0 - 10.5 K/uL 8.7 7.4 6.4  Hemoglobin 13.0 - 17.0 g/dL 14.6 14.4 15.0  Hematocrit 39.0  - 52.0 % 43.9 41.6 43.3  Platelets 150 - 400 K/uL 348 262.0 295    No results found for: VD25OH  Clinical ASCVD: No  The 10-year ASCVD risk score Mikey Bussing DC Jr., et al., 2013) is: 60.8%   Values used to calculate the score:     Age: 32 years     Sex: Male     Is Non-Hispanic African American: No     Diabetic: Yes     Tobacco smoker: No     Systolic Blood Pressure: 782 mmHg     Is BP treated: Yes     HDL Cholesterol: 37.7 mg/dL     Total Cholesterol: 179 mg/dL    Depression screen [facility] 2/9 02/19/2020 12/15/2019 07/20/2019  Decreased Interest 0 0 0  Down, Depressed, Hopeless 0 0 0  PHQ - 2 Score 0 0 0  Altered sleeping - - -  Tired, decreased energy - - -  Change in appetite - - -  Feeling bad or failure about yourself  - - -  Trouble concentrating - - -  Moving slowly or fidgety/restless - - -  Suicidal thoughts - - -  PHQ-9 Score - - -  Difficult doing work/chores - - -  Some recent data might be hidden    Social History   Tobacco Use  Smoking Status Never Smoker  Smokeless Tobacco Never Used   BP Readings from Last 3 Encounters:  04/12/20 132/80  04/04/20 (!) 139/91  03/27/20 (!) 166/85   Pulse Readings from Last 3 Encounters:  04/12/20 83  04/04/20 75  03/27/20 90   Wt Readings from Last 3 Encounters:  04/12/20 263 lb (119.3 kg)  04/04/20 260 lb (117.9 kg)  03/27/20 265 lb (120.2 kg)    Assessment/Interventions: Review of patient past medical history, allergies, medications, health status, including review of consultants reports, laboratory and other test data, was performed as part of comprehensive evaluation and provision of chronic care management services.   SDOH:  (Social Determinants of Health) assessments and interventions performed: Yes   CCM Care Plan  Allergies  Allergen Reactions  . Morphine And Related Nausea And Vomiting    Causes nausea & vomiting  . Codeine Sulfate Other (See Comments)    intolerant  . Metformin And Related Other (See  Comments)    Gi intolerance, not an allergy    Medications Reviewed Today    Reviewed by Wallene Huh, DPM (Physician) on 06/07/20 at [place] List Status: <None>  Medication Order Taking? Sig Documenting Provider Last Dose Status Informant  allopurinol (ZYLOPRIM) 300 MG tablet [id number] No Take 0.5 tablets (150 mg total) by mouth every morning. Tonia Ghent, MD Taking Active   amLODipine (NORVASC) 10 MG tablet [id number] No TAKE 1 TABLET BY MOUTH DAILY Tonia Ghent, MD Taking Active   Ascorbic Acid (VITAMIN C WITH ROSE HIPS) 500 MG  tablet [id number] No Take 500 mg by mouth daily. [provider] Taking Active   aspirin 81 MG tablet [id number] No Take 81 mg by mouth daily. [provider] Taking Active Self  atorvastatin (LIPITOR) 10 MG tablet [id number] No TAKE 1 TABLET BY MOUTH EACH NIGHT AT BEDTIME Tonia Ghent, MD Taking Active   Blood Glucose Monitoring Suppl (ONE TOUCH ULTRA 2) w/Device KIT [id number] No 1 each daily by Does not apply route. Renato Shin, MD Taking Active   CINNAMON PO [id number] No Take 200 mg by mouth daily. [provider] Taking Active   Continuous Blood Gluc Sensor (FREESTYLE LIBRE 14 DAY SENSOR) MISC [id number] No 1 Device by Does not apply route every 14 (fourteen) days. Use daily PRN to check sugar. Dx E11.9 w/hyperglycemia requiring insulin Tonia Ghent, MD Taking Active   diclofenac sodium (VOLTAREN) 1 % GEL [id number] No Uses PRN [provider] Taking Active   dorzolamide-timolol (COSOPT) 22.3-6.8 MG/ML ophthalmic solution [id number] No Place 1 drop into the left eye 2 (two) times daily.  [provider] Taking Active   Dulaglutide (TRULICITY) 3 VE/9.3YB SOPN [id number]  Inject 3 mg as directed once a week. Tonia Ghent, MD  Active   FARXIGA 5 MG TABS tablet [id number] No TAKE 1 TABLET BY MOUTH DAILY BEFORE BREAKFAST Tonia Ghent, MD Taking Active   gemfibrozil (LOPID) 600 MG tablet [id number] No Take  1 tablet (600 mg total) by mouth 2 (two) times daily. Tonia Ghent, MD Taking Active   gentamicin cream (GARAMYCIN) 0.1 % [id number]  Apply 1 application topically 3 (three) times daily. Tonia Ghent, MD  Active   glucose blood (ONE TOUCH ULTRA TEST) test strip [id number] No Check blood sugar before and after each meal and as directed. Dx E11.9 Tonia Ghent, MD Taking Active   insulin glargine, 2 Unit Dial, (TOUJEO MAX SOLOSTAR) 300 UNIT/ML Solostar Pen [id number] No 170 units injected in the AM Tonia Ghent, MD Taking Active   Insulin Pen Needle (B-D UF III MINI PEN NEEDLES) 31G X 5 MM MISC [id number] No 1 each by Does not apply route daily. Use to administer insulin daily Tonia Ghent, MD Taking Active   latanoprost (XALATAN) 0.005 % ophthalmic solution [id number] No Place 1 drop into both eyes at bedtime. [provider] Taking Active Self  losartan (COZAAR) 100 MG tablet [id number] No TAKE 1 TABLET BY MOUTH DAILY Tonia Ghent, MD Taking Active   meloxicam (MOBIC) 15 MG tablet [id number] No Take 0.5-1 tablets (7.5-15 mg total) by mouth daily as needed for pain (with food). Tonia Ghent, MD Taking Active   methylcellulose (CITRUCEL) oral powder [id number] No Take 1 packet by mouth daily. Yetta Flock, MD Taking Active   metoprolol succinate (TOPROL-XL) 25 MG 24 hr tablet [id number] No TAKE 1 TABLET BY MOUTH DAILY Tonia Ghent, MD Taking Active   Multiple Vitamins-Minerals (VITRUM 50+ SENIOR MULTI PO) [id number] No Take 1 tablet by mouth daily.  [provider] Taking Active Self  Tadalafil 2.5 MG TABS [id number] No TAKE 1 TABLET BY MOUTH DAILY Tonia Ghent, MD Taking Active   triamcinolone cream (KENALOG) 0.1 % [id number] No Apply 1 application topically 2 (two) times daily as needed. Tonia Ghent, MD Taking Active           Patient Active Problem List   Diagnosis Date Noted  . Back pain 10/22/2019  . Fall at home 09/27/2019  . Advance care  planning 07/25/2019  . Pressure ulcer, buttock 04/17/2019  . Skin irritation 03/29/2019  . Gout 07/22/2018  . HTN (hypertension) 06/11/2017  . Hyperlipemia   . Lower urinary tract symptoms (LUTS) 03/04/2017  . Bell's palsy 10/11/2016  . Paresthesia 05/28/2016  . Medicare annual wellness visit, subsequent 04/22/2016  . Cough 04/22/2016  . Allergic rhinitis 03/12/2016  . 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 Erectile dysfunction 06/11/2015  . Rhinorrhea 03/13/2015  . 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 Dislocation, hip ([place]) 11/09/2014  . DJD (degenerative joint disease) 11/06/2014  . Primary osteoarthritis of right knee   . Primary localized osteoarthritis of right hip   . Spinal stenosis of lumbar region with radiculopathy   . Diabetes mellitus type 2 in obese ([place])   . PONV (postoperative nausea and vomiting)   . GERD (gastroesophageal reflux disease)   . Frequent urination at night   . Kidney stones   . Arthritis   . Neuromuscular disorder ([place])   . Benign prostatic hyperplasia with urinary obstruction 07/14/2012  . Chronic calculus cholecystitis 12/31/2011  . Gouty arthropathy 03/01/2009    Immunization History  Administered Date(s) Administered  . Fluad Quad(high Dose 65+) 12/28/2019  . Influenza Split 01/30/2009, 02/07/2010  . Influenza, High Dose Seasonal PF 12/17/2011, 01/14/2017  . Influenza,inj,Quad PF,6+ Mos 02/17/2016, 01/19/2018  . Influenza,inj,quad, With Preservative 12/01/2018  . Influenza,trivalent, recombinat, inj, PF 01/02/2011  . Influenza-Unspecified 03/11/2015, 12/01/2018  . PFIZER(Purple Top)SARS-COV-2 Vaccination 04/22/2019, 05/13/2019, 12/13/2019  . PPD Test 08/04/2008  . Pneumococcal Conjugate-13 02/07/2010, 03/26/2014  . Pneumococcal Polysaccharide-23 02/07/2010  . Td 04/04/2012  . Tdap 03/26/2006  . Zoster 03/30/2009    Conditions to be addressed/monitored:  Hypertension, Hyperlipidemia and Diabetes  There are no care plans that you  recently modified to display for this patient.   Patient Care Plan: CCM Pharmacy Care Plan    Problem Identified: CHL AMB "PATIENT-SPECIFIC PROBLEM"     Long-Range Goal: Disease Management   Start Date: 06/04/2020  Priority: High  Note:   Current Barriers:  . Unable to achieve control of diabetes   . Not keeping a log of BG readings  Pharmacist Clinical Goal(s):  [name] Over the next 30 days, patient will adhere to plan to optimize therapeutic regimen for diabetes as evidenced by report of adherence to recommended medication management changes through collaboration with PharmD and provider.  [name] Keep written log of BG readings twice daily   Interventions: . 1:1 collaboration with Tonia Ghent, MD regarding development and update of comprehensive plan of care as evidenced by provider attestation and co-signature . Inter-disciplinary care team collaboration (see longitudinal plan of care) . Comprehensive medication review performed; medication list updated in electronic medical record  Hypertension (BP goal <140/90) Query -Controlled -Current treatment:  Losartan 100 mg - 1 tablet daily  Metoprolol Succinate ER - 1 tablet daily   Amlodipine 10 mg - 1 tablet daily -Medications previously tried: none reported  -Current home readings: none reported -Denies hypotensive/hypertensive symptoms -Educated on Importance of home blood pressure monitoring; Proper BP monitoring technique; -Counseled to monitor BP at home weekly, document, and provide log at future appointments -Recommended to continue current medication  Hyperlipidemia: (LDL goal < 70) -Not ideally controlled - TG elevated -Current treatment:  Atorvastatin 10 mg - 1 tablet daily   Gemfibrozil 600 mg - 1 tablet BID  Aspirin 81 mg - 1 tablet daily -Medications previously tried: none - TG likely elevated due to uncontrolled diabetes. Work on diabetes control.  Continues current therapy otherwise. No history of statin  intolerance so we could increase statin and consider off gemfibrozil If TG come down. -Educated on Benefits of statin for ASCVD risk reduction; Weight loss -Recommended to continue current medication  Diabetes (A1c goal <8%) -Query uncontrolled - Goal is to bring sugar down for back surgery -Current medications:  Farxiga 5 mg - 1 tablet daily   Toujeo Max Solostar 300 unit/mL - 170 units every morning *Tried BID and forgot second dose, went back to QD*  Trulicity 3 mg - Inject once weekly on Sundays *Increased from 1.5 mg to 3 mg on 04/21/20*  Cinnamon 200 mg daily -Medications previously tried: Januvia (replaced with Trulicity), Metformin (GI intolerance) -Current home glucose readings - Freestyle Libre changed back to regular glucometer due to concern for inaccuracy He reports checking fasting BG daily. He will check again 2 hours after breakfast. Checking 5-6 times daily.  Fasting ranging 100-200 per memory  Post-prandial: 250-400  -Denies hypoglycemic/hyperglycemic symptoms -Current meal patterns:   Breakfast - varies, Glucerna or scrambled eggs (late morning)  Lunch - Glucerna or scrambled eggs, ham sandwich  Supper - pork, slaw, potatoes (watches portions)  Snacks - fruit (grapes)  Drinks - water, diet tea, diet pepsi, denies juice, milk (not daily) -Current exercise: walks some, limited by back pain -Reports trying to lose weight and watch portions. He does not have a scale. -Educated on Proper insulin injection technique; Injects in abdomen. Rotates site. Denies any hard spots.  -Counseled to check feet daily and get yearly eye exams -Recommended to continue current medication. Keep log of BG readings twice daily. CMA to review BG log monitoring.  UPON REVIEW - Pt did not increase Trulicity to 3 mg (dose was sent to wrong pharmacy by accident). Will have CMA send in updated dose of 3 mg weekly to [facility].   Patient Goals/Self-Care Activities . Over  the next 30 days, patient will:  - check glucose before breakfast and 2 hours after meals, document, and provide at future appointments  Follow Up Plan: Face to Face appointment with care management team member scheduled for: July 02, 2020 at 2 PM [facility])  Medication Assistance: Trulicityobtained through [place] medication assistance program.  Enrollment ends end of 2022    Patient's preferred pharmacy is:  [facility] - [place], [place] - [address] [place] [address] Phone: [phone] Fax: [phone]  [place], [place] [address] Phone: [phone] Fax: [phone]  [facility] [place], [address] [place] [address] Phone: [phone] Fax: [phone]  Care Plan and Follow Up Patient Decision:  Patient agrees to Care Plan and Follow-up.  Debbora Dus, PharmD Clinical Pharmacist [facility] at [facility] [phone]

## 2020-07-01 NOTE — Progress Notes (Signed)
Attempted to contact Mr. Bouse 06/14/20 to review blood sugars. Unable to reach patient. Left message for patient to return call.    Follow-Up:  Pharmacist Review  Debbora Dus, CPP notified  Margaretmary Dys, [place] Pharmacy Assistant [phone]

## 2020-07-02 ENCOUNTER — Telehealth: Payer: Self-pay | Admitting: Family Medicine

## 2020-07-02 ENCOUNTER — Telehealth: Payer: HMO

## 2020-07-02 NOTE — Telephone Encounter (Signed)
[name] and he missed his face to face apt and wanted to know about what to do.

## 2020-07-03 ENCOUNTER — Telehealth: Payer: Self-pay

## 2020-07-03 NOTE — Chronic Care Management (AMB) (Addendum)
Chronic Care Management Pharmacy Assistant   Name: DANH BAYUS  MRN: [id number] DOB: Apr 19, 1940  Reason for Encounter: Disease State- Diabetes and Reschedule Missed CCM Appointment 07/02/20   Conditions to be addressed/monitored: DMII  Recent office visits:  None since last CCM appointment  Recent consult visits:  [facility]:  None in previous 6 months  Medications: Outpatient Encounter Medications as of 07/03/2020  Medication Sig   allopurinol (ZYLOPRIM) 300 MG tablet Take 0.5 tablets (150 mg total) by mouth every morning.   amLODipine (NORVASC) 10 MG tablet TAKE 1 TABLET BY MOUTH DAILY   Ascorbic Acid (VITAMIN C WITH ROSE HIPS) 500 MG tablet Take 500 mg by mouth daily.   aspirin 81 MG tablet Take 81 mg by mouth daily.   atorvastatin (LIPITOR) 10 MG tablet TAKE 1 TABLET BY MOUTH EACH NIGHT AT BEDTIME   Blood Glucose Monitoring Suppl (ONE TOUCH ULTRA 2) w/Device KIT 1 each daily by Does not apply route.   CINNAMON PO Take 200 mg by mouth daily.   Continuous Blood Gluc Sensor (FREESTYLE LIBRE 14 DAY SENSOR) MISC 1 Device by Does not apply route every 14 (fourteen) days. Use daily PRN to check sugar. Dx E11.9 w/hyperglycemia requiring insulin   dapagliflozin propanediol (FARXIGA) 5 MG TABS tablet Take 1 tablet (5 mg total) by mouth daily before breakfast.   diclofenac sodium (VOLTAREN) 1 % GEL Uses PRN   dorzolamide-timolol (COSOPT) 22.3-6.8 MG/ML ophthalmic solution Place 1 drop into the left eye 2 (two) times daily.    Dulaglutide (TRULICITY) 3 FI/4.3PI SOPN Inject 3 mg as directed once a week.   gemfibrozil (LOPID) 600 MG tablet Take 1 tablet (600 mg total) by mouth 2 (two) times daily.   gentamicin cream (GARAMYCIN) 0.1 % Apply 1 application topically 3 (three) times daily.   glucose blood (ONE TOUCH ULTRA TEST) test strip Check blood sugar before and after each meal and as directed. Dx E11.9   insulin glargine, 2 Unit Dial, (TOUJEO  MAX SOLOSTAR) 300 UNIT/ML Solostar Pen 170 units injected in the AM   Insulin Pen Needle (B-D UF III MINI PEN NEEDLES) 31G X 5 MM MISC 1 each by Does not apply route daily. Use to administer insulin daily   latanoprost (XALATAN) 0.005 % ophthalmic solution Place 1 drop into both eyes at bedtime.   losartan (COZAAR) 100 MG tablet TAKE 1 TABLET BY MOUTH DAILY   meloxicam (MOBIC) 15 MG tablet Take 0.5-1 tablets (7.5-15 mg total) by mouth daily as needed for pain (with food).   methylcellulose (CITRUCEL) oral powder Take 1 packet by mouth daily.   metoprolol succinate (TOPROL-XL) 25 MG 24 hr tablet TAKE 1 TABLET BY MOUTH DAILY   Multiple Vitamins-Minerals (VITRUM 50+ SENIOR MULTI PO) Take 1 tablet by mouth daily.    Tadalafil 2.5 MG TABS TAKE 1 TABLET BY MOUTH DAILY   triamcinolone cream (KENALOG) 0.1 % Apply 1 application topically 2 (two) times daily as needed.   No facility-administered encounter medications on file as of 07/03/2020.     Recent Relevant Labs: Lab Results  Component Value Date/Time   HGBA1C 9.7 (A) 04/12/2020 10:11 AM   HGBA1C 9.3 (A) 12/28/2019 11:49 AM   HGBA1C 9.9 (H) 07/17/2019 10:28 AM   HGBA1C 9.6 (H) 07/15/2018 09:35 AM    Kidney Function Lab Results  Component Value Date/Time   CREATININE 1.36 02/19/2020 10:50 AM   CREATININE 1.61 (H) 12/12/2019 03:55 PM   CREATININE 1.44  12/21/2014 12:00 AM   CREATININE 1.32 05/09/2014 12:00 AM   GFR 49.68 (L) 02/19/2020 10:50 AM   GFRNONAA 40 (L) 12/12/2019 03:55 PM   GFRAA 47 (L) 12/12/2019 03:55 PM   Diabetes  Current antihyperglycemic regimen:  Farxiga 5 mg - 1 tablet daily  Toujeo Max Solostar 300 unit/mL - 170 units every morning  Trulicity 3 mg - Inject once weekly on Sundays  Cinnamon 200 mg daily (no longer taking, states it was not helping)   Patient verbally confirms he is taking the above medications as directed. Yes  What recent interventions/DTPs have been made to improve glycemic control:  Toujeo Max  Solostar 300 unit/mL - 170 units every morning Tried BID and forgot second dose, went back to QD Trulicity 3 mg - Inject once weekly on Sundays Increased from 05/2020 (PAP)  Have there been any recent hospitalizations or ED visits since last visit with CPP? No  Patient denies hypoglycemic symptoms, including Pale, Sweaty, Shaky, Hungry, Nervous/irritable and Vision changes  Patient denies hyperglycemic symptoms, including blurry vision, excessive thirst, fatigue, polyuria and weakness  How often are you checking your blood sugar? twice daily  What are your blood sugars ranging?  Fasting: 101-145 Before meals: N/A After meals: 160-397 Bedtime: N/A  On insulin? Yes How many units:Toujeo Max Solostar 300 unit/mL - 170 units every morning  During the week, how often does your blood glucose drop below 70? Never  Are you checking your feet daily/regularly? Yes and sees podiatry regularly  Adherence Review: Is the patient currently on a STATIN medication? Yes Is the patient currently on ACE/ARB medication? Yes Does the patient have >5 day gap between last estimated fill dates? CPP to review  Star Rating Drugs:  Medication:  Last Fill: Day Supply Atorvastatin 10 mg 04/08/20 90 Farxiga 5 mg  09/30/38 30 Trulicity 3 mg-             gets from patient assistance Losartan 100 mg 06/18/20 90    He stated there was some confusion regarding his missed CCM appointment and he did not think he needed to come in the office. Appointment was rescheduled for 10/03/20 at 9:30 - face to face visit. Patient is aware to bring blood sugar readings and his medications and supplements. Will call to remind patient prior to next visit.   Follow-Up:  Pharmacist Review and Scheduled Follow-Up With Clinical Pharmacist  Debbora Dus, CPP notified  Margaretmary Dys, [place] [phone]  I have reviewed the care management and care coordination activities outlined in this encounter and  I am certifying that I agree with the content of this note. Patient missed last two in person CCM visits. He declines telephone visits. Scheduled CCM for July 2022.  Debbora Dus, PharmD Clinical Pharmacist [facility] at [facility] [phone]

## 2020-07-04 DIAGNOSIS — N3941 Urge incontinence: Secondary | ICD-10-CM | POA: Diagnosis not present

## 2020-07-04 DIAGNOSIS — R350 Frequency of micturition: Secondary | ICD-10-CM | POA: Diagnosis not present

## 2020-07-11 ENCOUNTER — Ambulatory Visit (INDEPENDENT_AMBULATORY_CARE_PROVIDER_SITE_OTHER): Payer: HMO | Admitting: Family Medicine

## 2020-07-11 ENCOUNTER — Other Ambulatory Visit: Payer: Self-pay

## 2020-07-11 ENCOUNTER — Encounter: Payer: Self-pay | Admitting: Family Medicine

## 2020-07-11 VITALS — BP 130/74 | HR 82 | Temp 97.6°F | Ht 72.0 in | Wt 256.0 lb

## 2020-07-11 DIAGNOSIS — E669 Obesity, unspecified: Secondary | ICD-10-CM | POA: Diagnosis not present

## 2020-07-11 DIAGNOSIS — E1169 Type 2 diabetes mellitus with other specified complication: Secondary | ICD-10-CM | POA: Diagnosis not present

## 2020-07-11 LAB — CBC WITH DIFFERENTIAL/PLATELET
Basophils Absolute: 0.1 10*3/uL (ref 0.0–0.1)
Basophils Relative: 0.7 % (ref 0.0–3.0)
Eosinophils Absolute: 0.3 10*3/uL (ref 0.0–0.7)
Eosinophils Relative: 4.8 % (ref 0.0–5.0)
HCT: 45 % (ref 39.0–52.0)
Hemoglobin: 15.4 g/dL (ref 13.0–17.0)
Lymphocytes Relative: 21.1 % (ref 12.0–46.0)
Lymphs Abs: 1.5 10*3/uL (ref 0.7–4.0)
MCHC: 34.2 g/dL (ref 30.0–36.0)
MCV: 91 fl (ref 78.0–100.0)
Monocytes Absolute: 0.8 10*3/uL (ref 0.1–1.0)
Monocytes Relative: 10.5 % (ref 3.0–12.0)
Neutro Abs: 4.6 10*3/uL (ref 1.4–7.7)
Neutrophils Relative %: 62.9 % (ref 43.0–77.0)
Platelets: 316 10*3/uL (ref 150.0–400.0)
RBC: 4.95 Mil/uL (ref 4.22–5.81)
RDW: 15.2 % (ref 11.5–15.5)
WBC: 7.3 10*3/uL (ref 4.0–10.5)

## 2020-07-11 LAB — POCT GLYCOSYLATED HEMOGLOBIN (HGB A1C): Hemoglobin A1C: 10.5 % — AB (ref 4.0–5.6)

## 2020-07-11 LAB — BASIC METABOLIC PANEL
BUN: 29 mg/dL — ABNORMAL HIGH (ref 6–23)
CO2: 25 mEq/L (ref 19–32)
Calcium: 10.7 mg/dL — ABNORMAL HIGH (ref 8.4–10.5)
Chloride: 98 mEq/L (ref 96–112)
Creatinine, Ser: 1.52 mg/dL — ABNORMAL HIGH (ref 0.40–1.50)
GFR: 43.36 mL/min — ABNORMAL LOW (ref >60.00)
Glucose, Bld: 226 mg/dL — ABNORMAL HIGH (ref 70–99)
Potassium: 4 mEq/L (ref 3.5–5.1)
Sodium: 136 mEq/L (ref 135–145)

## 2020-07-11 LAB — TSH: TSH: 4.48 u[IU]/mL (ref 0.35–4.50)

## 2020-07-11 MED ORDER — TOUJEO MAX SOLOSTAR 300 UNIT/ML ~~LOC~~ SOPN: PEN_INJECTOR | SUBCUTANEOUS | Status: DC

## 2020-07-11 NOTE — Patient Instructions (Signed)
Go to the lab on the way out.   If you have mychart we'll likely use that to update you.    Let me see about options when I get your labs back.  Take care.  Glad to see you. Plan on recheck in 3 months.

## 2020-07-11 NOTE — Progress Notes (Signed)
This visit occurred during the SARS-CoV-2 public health emergency.  Safety protocols were in place, including screening questions prior to the visit, additional usage of staff PPE, and extensive cleaning of exam room while observing appropriate contact time as indicated for disinfecting solutions.  Diabetes:  Using medications without difficulties: yes Hypoglycemic episodes: no Hyperglycemic episodes: see below.   Feet problems:  No tingling now, occ noted prev.   Blood Sugars averaging: ~200 in the AM, usually "pretty good" later in the day.   eye exam within last year: yes A1c up to 10.5.  Diet dw pt.  He wasn't expecting A1c to be up.   trulicity 3mg  weekly.  No ADE on med.   farxiga 5mg  a day.  Insulin 175 units daily.    Eat right diet (low carbohydrate) d/w pt.    He doesn't have surgery date scheduled yet.  D/w pt.  He is still having pain most days, the same pain as prior.    Meds, vitals, and allergies reviewed.  ROS: Per HPI unless specifically indicated in ROS section   GEN: nad, alert and oriented HEENT: NCAT NECK: supple w/o LA CV: rrr. PULM: ctab, no inc wob ABD: soft, +bs EXT: Trace BLE edema SKIN: Well-perfused Gait is slow but symmetric, due to back pain.

## 2020-07-14 NOTE — Assessment & Plan Note (Addendum)
Taking Trulicity 3 mg a week, Farxiga 5 mg a day and insulin 175 units a day.  I want to consider options in the meantime.  Recheck fructosamine CBC and be met today.  See notes on labs.  A1c was higher than I&D expected. 

## 2020-07-15 ENCOUNTER — Telehealth: Payer: Self-pay

## 2020-07-15 LAB — FRUCTOSAMINE: Fructosamine: 392 umol/L — ABNORMAL HIGH (ref 205–285)

## 2020-07-15 NOTE — Progress Notes (Signed)
Entered in error

## 2020-07-15 NOTE — Chronic Care Management (AMB) (Addendum)
Chronic Care Management Pharmacy Assistant   Name: [name]  MRN: [id number] DOB: 1940-07-13  Reason for Encounter: [facility] Follow Up  Medications: Outpatient Encounter Medications as of 07/15/2020  Medication Sig   allopurinol (ZYLOPRIM) 300 MG tablet Take 0.5 tablets (150 mg total) by mouth every morning.   amLODipine (NORVASC) 10 MG tablet TAKE 1 TABLET BY MOUTH DAILY   Ascorbic Acid (VITAMIN C WITH ROSE HIPS) 500 MG tablet Take 500 mg by mouth daily.   aspirin 81 MG tablet Take 81 mg by mouth daily.   atorvastatin (LIPITOR) 10 MG tablet TAKE 1 TABLET BY MOUTH EACH NIGHT AT BEDTIME   Blood Glucose Monitoring Suppl (ONE TOUCH ULTRA 2) w/Device KIT 1 each daily by Does not apply route.   CINNAMON PO Take 200 mg by mouth daily.   Continuous Blood Gluc Sensor (FREESTYLE LIBRE 14 DAY SENSOR) MISC 1 Device by Does not apply route every 14 (fourteen) days. Use daily PRN to check sugar. Dx E11.9 w/hyperglycemia requiring insulin   dapagliflozin propanediol (FARXIGA) 5 MG TABS tablet Take 1 tablet (5 mg total) by mouth daily before breakfast.   diclofenac sodium (VOLTAREN) 1 % GEL Uses PRN   dorzolamide-timolol (COSOPT) 22.3-6.8 MG/ML ophthalmic solution Place 1 drop into the left eye 2 (two) times daily.    Dulaglutide (TRULICITY) 3 MG/0.5ML SOPN Inject 3 mg as directed once a week.   gemfibrozil (LOPID) 600 MG tablet Take 1 tablet (600 mg total) by mouth 2 (two) times daily.   gentamicin cream (GARAMYCIN) 0.1 % Apply 1 application topically 3 (three) times daily.   glucose blood (ONE TOUCH ULTRA TEST) test strip Check blood sugar before and after each meal and as directed. Dx E11.9   insulin glargine, 2 Unit Dial, (TOUJEO MAX SOLOSTAR) 300 UNIT/ML Solostar Pen 175 units injected in the AM   Insulin Pen Needle (B-D UF III MINI PEN NEEDLES) 31G X 5 MM MISC 1 each by Does not apply route daily. Use to administer insulin daily   latanoprost (XALATAN) 0.005 % ophthalmic solution Place 1  drop into both eyes at bedtime.   losartan (COZAAR) 100 MG tablet TAKE 1 TABLET BY MOUTH DAILY   meloxicam (MOBIC) 15 MG tablet Take 0.5-1 tablets (7.5-15 mg total) by mouth daily as needed for pain (with food).   methylcellulose (CITRUCEL) oral powder Take 1 packet by mouth daily.   metoprolol succinate (TOPROL-XL) 25 MG 24 hr tablet TAKE 1 TABLET BY MOUTH DAILY   Multiple Vitamins-Minerals (VITRUM 50+ SENIOR MULTI PO) Take 1 tablet by mouth daily.    Tadalafil 2.5 MG TABS TAKE 1 TABLET BY MOUTH DAILY   triamcinolone cream (KENALOG) 0.1 % Apply 1 application topically 2 (two) times daily as needed.   Vibegron (GEMTESA) 75 MG TABS Take 75 mg by mouth at bedtime.   No facility-administered encounter medications on file as of 07/15/2020.    Contacted [name] to confirm his pharmacy transfer from [facility] to [facility] and to request any feedback he would like to provide. Patient stated that it was not a good change for him and preferred to get his medications from [facility] despite wanting to have medications in packaging.  Also verified with [name] that he was taking Trulicity 3 mg once weekly.   Follow-Up:  Pharmacist Review  [name], CPP notified  [name], [facility] Clinical Pharmacy Assistant [phone]  I have reviewed the care management and care coordination activities outlined in this encounter  and I am certifying that I agree with the content of this note. No further action required.  [name], PharmD Clinical Pharmacist [facility] at [facility] [phone]

## 2020-07-18 ENCOUNTER — Other Ambulatory Visit: Payer: Self-pay | Admitting: Family Medicine

## 2020-07-18 ENCOUNTER — Telehealth: Payer: Self-pay | Admitting: Family Medicine

## 2020-07-18 ENCOUNTER — Ambulatory Visit (INDEPENDENT_AMBULATORY_CARE_PROVIDER_SITE_OTHER): Payer: HMO

## 2020-07-18 DIAGNOSIS — E669 Obesity, unspecified: Secondary | ICD-10-CM

## 2020-07-18 DIAGNOSIS — E1169 Type 2 diabetes mellitus with other specified complication: Secondary | ICD-10-CM

## 2020-07-18 DIAGNOSIS — I10 Essential (primary) hypertension: Secondary | ICD-10-CM | POA: Diagnosis not present

## 2020-07-18 MED ORDER — DAPAGLIFLOZIN PROPANEDIOL 10 MG PO TABS: 10.0000 mg | ORAL_TABLET | Freq: Every day | ORAL | Status: DC

## 2020-07-18 NOTE — Telephone Encounter (Signed)
Please update patient.   I would suggest increasing his [name] to 10 mg.  Let me know if he needs a new rx. Then if he tolerates that and if needed, he may end up needing to increase Trulicity to 4.5 mg weekly.  I wouldn't change trulicity yet.  I would have him update me on the higher dose of trulicity in about 2 weeks.  Thanks.

## 2020-07-18 NOTE — Progress Notes (Signed)
I would suggest increasing his [name] to 10 mg as a starting place. Let me also contact the manufacturer to ensure he received the 3 mg Trulicity dose. At last visit he was still taking the 1.5 mg weekly.

## 2020-07-18 NOTE — Progress Notes (Signed)
Contacted [facility] to verify dosage they have on file for [employer]. Representative states they have Trulicity 3 mg/ 0.5 mg on file.  Debbora Dus, CPP notified  Margaretmary Dys, [place] Pharmacy Assistant [phone]

## 2020-07-18 NOTE — Progress Notes (Addendum)
Chronic Care Management Pharmacy Note  07/18/2020 Name:  [name] MRN:  [id number] DOB:  1940/11/17  Subjective: [name] is an 80 y.o. year old male who is a primary patient of Damita Dunnings, Elveria Rising, MD.  The CCM team was consulted for assistance with disease management and care coordination needs.    Collaboration with PCP for follow up visit in response to provider referral for pharmacy case management and/or care coordination services.  Multiple unsuccessful outreaches to patient to discuss elevated A1c and review BG log. Patient missed face 2 face appointment on 07/02/20. Chart reviewed.  Consent to Services:  The patient was given information about Chronic Care Management services, agreed to services, and gave verbal consent prior to initiation of services.  Please see initial visit note for detailed documentation.   Patient Care Team: Tonia Ghent, MD as PCP - General (Family Medicine) Clent Jacks, MD as Consulting Physician (Ophthalmology) Renato Shin, MD as Consulting Physician (Endocrinology) Armbruster, Carlota Raspberry, MD as Consulting Physician (Gastroenterology) Irene Limbo, MD as Consulting Physician (Plastic Surgery) Garvin Fila, MD as Consulting Physician (Neurology) Debbora Dus, [facility] as Pharmacist (Pharmacist)  Recent office visits: 07/11/20 - PCP - Taking Trulicity 3 mg a week x 1 month, Farxiga 5 mg a day and insulin 175 units a day in AM.  I want to consider options in the meantime.  Recheck fructosamine CBC and be met today.  See notes on labs.  A1c was higher than expected. 07/03/20 - CCM - BG log  Fasting: 101-145  Before meals: N/A  After meals: 160-397  Recent consult visits: 06/07/20 [facility] visits: None in previous 6 months  Objective:  Lab Results  Component Value Date   CREATININE 1.52 (H) 07/11/2020   BUN 29 (H) 07/11/2020   GFR 43.36 (L) 07/11/2020   GFRNONAA 40 (L) 12/12/2019   GFRAA 47 (L) 12/12/2019   NA  136 07/11/2020   K 4.0 07/11/2020   CALCIUM 10.7 (H) 07/11/2020   CO2 25 07/11/2020   GLUCOSE 226 (H) 07/11/2020    Lab Results  Component Value Date/Time   HGBA1C 10.5 (A) 07/11/2020 09:13 AM   HGBA1C 9.7 (A) 04/12/2020 10:11 AM   HGBA1C 9.9 (H) 07/17/2019 10:28 AM   HGBA1C 9.6 (H) 07/15/2018 09:35 AM   FRUCTOSAMINE 392 (H) 07/11/2020 09:41 AM   GFR 43.36 (L) 07/11/2020 09:41 AM   GFR 49.68 (L) 02/19/2020 10:50 AM    Last diabetic Eye exam:  Lab Results  Component Value Date/Time   HMDIABEYEEXA No Retinopathy 01/01/2020 12:00 AM    Last diabetic Foot exam: 12/28/19 - PCP   Lab Results  Component Value Date   CHOL 179 07/17/2019   HDL 37.70 (L) 07/17/2019   LDLCALC 82 05/09/2014   LDLDIRECT 57.0 07/17/2019   TRIG (H) 07/17/2019    484.0 Triglyceride is over 400; calculations on Lipids are invalid.   CHOLHDL 5 07/17/2019    Hepatic Function Latest Ref Rng & Units 07/17/2019 07/15/2018 08/18/2016  Total Protein 6.0 - 8.3 g/dL 7.0 6.9 7.7  Albumin 3.5 - 5.2 g/dL 3.9 3.8 3.9  AST 0 - 37 U/L 38(H) 19 30  ALT 0 - 53 U/L 33 27 29  Alk Phosphatase 39 - 117 U/L 37(L) 36(L) 36(L)  Total Bilirubin 0.2 - 1.2 mg/dL 0.6 0.5 0.7    Lab Results  Component Value Date/Time   TSH 4.48 07/11/2020 09:41 AM    CBC Latest Ref Rng & Units 07/11/2020  12/12/2019 07/15/2018  WBC 4.0 - 10.5 K/uL 7.3 8.7 7.4  Hemoglobin 13.0 - 17.0 g/dL 15.4 14.6 14.4  Hematocrit 39.0 - 52.0 % 45.0 43.9 41.6  Platelets 150.0 - 400.0 K/uL 316.0 348 262.0    No results found for: VD25OH  Clinical ASCVD: No  The 10-year ASCVD risk score Mikey Bussing DC Jr., et al., 2013) is: 59.8%   Values used to calculate the score:     Age: 6 years     Sex: Male     Is Non-Hispanic African American: No     Diabetic: Yes     Tobacco smoker: No     Systolic Blood Pressure: 160 mmHg     Is BP treated: Yes     HDL Cholesterol: 37.7 mg/dL     Total Cholesterol: 179 mg/dL    Depression screen [facility] 2/9 02/19/2020 12/15/2019  07/20/2019  Decreased Interest 0 0 0  Down, Depressed, Hopeless 0 0 0  PHQ - 2 Score 0 0 0  Altered sleeping - - -  Tired, decreased energy - - -  Change in appetite - - -  Feeling bad or failure about yourself  - - -  Trouble concentrating - - -  Moving slowly or fidgety/restless - - -  Suicidal thoughts - - -  PHQ-9 Score - - -  Difficult doing work/chores - - -  Some recent data might be hidden    Social History   Tobacco Use  Smoking Status Never Smoker  Smokeless Tobacco Never Used   BP Readings from Last 3 Encounters:  07/11/20 130/74  04/12/20 132/80  04/04/20 (!) 139/91   Pulse Readings from Last 3 Encounters:  07/11/20 82  04/12/20 83  04/04/20 75   Wt Readings from Last 3 Encounters:  07/11/20 256 lb (116.1 kg)  04/12/20 263 lb (119.3 kg)  04/04/20 260 lb (117.9 kg)   BMI Readings from Last 3 Encounters:  07/11/20 34.72 kg/m  04/12/20 35.67 kg/m  04/04/20 35.26 kg/m    Assessment/Interventions: Review of patient past medical history, allergies, medications, health status, including review of consultants reports, laboratory and other test data, was performed as part of comprehensive evaluation and provision of chronic care management services.   SDOH:  (Social Determinants of Health) assessments and interventions performed: March 2022    SDOH Interventions   Flowsheet Row Most Recent Value  SDOH Interventions   Financial Strain Interventions Intervention Not Indicated  [Trulicity through patient assistance. Other medications affordable.]      SDOH Screenings   Alcohol Screen: Not on file  Depression (PHQ2-9): Low Risk   . PHQ-2 Score: 0  Financial Resource Strain: Low Risk   . Difficulty of Paying Living Expenses: Not very hard  Food Insecurity: Not on file  Housing: Not on file  Physical Activity: Not on file  Social Connections: Not on file  Stress: Not on file  Tobacco Use: Low Risk   . Smoking Tobacco Use: Never Smoker  . Smokeless  Tobacco Use: Never Used  Transportation Needs: Not on file    CCM Care Plan Patient Care Plan: CCM Pharmacy Care Plan    Problem Identified: CHL AMB "PATIENT-SPECIFIC PROBLEM"     Long-Range Goal: Disease Management   Start Date: 06/04/2020  Priority: High  Note:   Current Barriers:  . Unable to achieve control of diabetes   . Not keeping a log of BG readings  Pharmacist Clinical Goal(s):  [name] Over the next 30 days, patient will adhere to  plan to optimize therapeutic regimen for diabetes as evidenced by report of adherence to recommended medication management changes through collaboration with PharmD and provider.  [name] Keep written log of BG readings twice daily   Interventions: . 1:1 collaboration with Tonia Ghent, MD regarding development and update of comprehensive plan of care as evidenced by provider attestation and co-signature . Inter-disciplinary care team collaboration (see longitudinal plan of care) . Comprehensive medication review performed; medication list updated in electronic medical record  Hypertension (BP goal <140/90) Query -Controlled -Current treatment:  Losartan 100 mg - 1 tablet daily  Metoprolol Succinate ER - 1 tablet daily   Amlodipine 10 mg - 1 tablet daily -Medications previously tried: none reported  -Current home readings: none reported -Denies hypotensive/hypertensive symptoms -Educated on Importance of home blood pressure monitoring; Proper BP monitoring technique; -Counseled to monitor BP at home weekly, document, and provide log at future appointments -Recommended to continue current medication  Hyperlipidemia: (LDL goal < 70) -Not ideally controlled - TG elevated -Current treatment:  Atorvastatin 10 mg - 1 tablet daily   Gemfibrozil 600 mg - 1 tablet BID  Aspirin 81 mg - 1 tablet daily -Medications previously tried: none - TG likely elevated due to uncontrolled diabetes. Work on diabetes control. Continues current therapy  otherwise. No history of statin intolerance so we could increase statin and consider off gemfibrozil If TG come down. -Educated on Benefits of statin for ASCVD risk reduction; Weight loss -Recommended to continue current medication  Diabetes (A1c goal <8%) -Query uncontrolled - A1c 10.5% -Current medications:  Farxiga 5 mg - 1 tablet daily   Toujeo Max Solostar 300 unit/mL - 170 units every morning   Trulicity 3 mg - Inject once weekly on Sundays *Increased from 1.5 mg to 3 mg 06/06/20*  Cinnamon 200 mg daily -Medications previously tried: Januvia (replaced with Trulicity), Metformin (GI intolerance) -Current home glucose readings - Freestyle Libre changed back to regular glucometer due to concern for inaccuracy He reports checking 5-6 times daily. Log per CCM call 07/03/20  Fasting ranging 100-145  Post-prandial: 160-397 -Denies hypoglycemic/hyperglycemic symptoms -Current meal patterns:   Breakfast - varies, Glucerna or scrambled eggs (late morning)  Lunch - Glucerna or scrambled eggs, ham sandwich  Supper - pork, slaw, potatoes (watches portions)  Snacks - fruit (grapes)  Drinks - water, diet tea, diet pepsi, denies juice, milk (not daily) -Current exercise: walks some, limited by back pain -Reports trying to lose weight and watch portions. He does not have a scale. -Educated on Proper insulin injection technique; Injects in abdomen. Rotates site. Denies any hard spots.  -Counseled to check feet daily and get yearly eye exams -Recommended to continue current medication. Recommend - Increase Farxiga to 10 mg daily. Keep written log of BG readings twice daily. CMA to review BG log monitoring monthly.  Patient Goals/Self-Care Activities . Over the next 30 days, patient will:  - check glucose before breakfast and 2 hours after meals, document, and provide at future appointments  Follow Up Plan: Face to Face appointment with care management team member scheduled for: July  2022  Medication Assistance: Trulicity obtained through [facility] medication assistance program.  Enrollment ends end of 2022     Debbora Dus, PharmD Clinical Pharmacist [facility] at [facility] [phone]  Encounter details: CCM Time Spent      Value Time User   Time spent with patient (minutes)  60 07/18/2020  4:11 PM Debbora Dus, [facility]   Time spent performing Chart  review  30 07/18/2020  3:51 PM Debbora Dus, [facility]   Total time (minutes)  90 07/18/2020  4:11 PM Debbora Dus, RPH     Moderate to High Complex Decision Making      Value Time User   Moderate to High complex decision making  Yes 07/18/2020  3:51 PM Debbora Dus, [facility]     CCM Services: This encounter meets complex CCM services and moderate to high decision making.  Prior to outreach and patient consent for Chronic Care Management, I referred this patient for services after reviewing the nominated patient list or from a personal encounter with the patient.  I have personally reviewed this encounter including the documentation in this note and have collaborated with the care management provider regarding care management and care coordination activities to include development and update of the comprehensive care plan. I am certifying that I agree with the content of this note and encounter as supervising physician.

## 2020-07-19 NOTE — Telephone Encounter (Signed)
LMTCB

## 2020-07-22 NOTE — Telephone Encounter (Signed)
Spoke with patient; advised patient to increase farxiga to 10 mg and he does need a new rx sent to pharmacy. Advised if he tolerates that and if still needed can increase trulicity to 4.5 mg weekly. Advised to update [place] in 2 weeks.

## 2020-08-01 DIAGNOSIS — R3915 Urgency of urination: Secondary | ICD-10-CM | POA: Diagnosis not present

## 2020-08-01 DIAGNOSIS — R350 Frequency of micturition: Secondary | ICD-10-CM | POA: Diagnosis not present

## 2020-08-02 DIAGNOSIS — M48061 Spinal stenosis, lumbar region without neurogenic claudication: Secondary | ICD-10-CM | POA: Diagnosis not present

## 2020-08-06 ENCOUNTER — Telehealth: Payer: Self-pay

## 2020-08-06 MED ORDER — TOUJEO MAX SOLOSTAR 300 UNIT/ML ~~LOC~~ SOPN: PEN_INJECTOR | SUBCUTANEOUS | 2 refills | Status: DC

## 2020-08-06 NOTE — Telephone Encounter (Signed)
Pt left v/m requesting insulin refill to [facility]. Pt last seen 07/11/20.

## 2020-08-06 NOTE — Telephone Encounter (Signed)
Rx refilled.

## 2020-08-07 ENCOUNTER — Other Ambulatory Visit: Payer: Self-pay | Admitting: Neurological Surgery

## 2020-08-09 ENCOUNTER — Telehealth: Payer: Self-pay

## 2020-08-09 NOTE — Chronic Care Management (AMB) (Addendum)
Chronic Care Management Pharmacy Assistant   Name: [name]  MRN: [id number] DOB: 03-Mar-1941   Reason for Encounter: Disease State    Conditions to be addressed/monitored: DMII  Recent office visits:  07/11/2020  Dr.Duncan, PCP - Labs (A1C,CMP,CBC) ordered. Increase Farxiga to 10 mg daily.   Recent consult visits:  08/02/2020  Dr.Henry Elsner, Neurosurgery   [facility] visits:  None in previous 6 months Medications: Outpatient Encounter Medications as of 08/09/2020  Medication Sig   allopurinol (ZYLOPRIM) 300 MG tablet Take 0.5 tablets (150 mg total) by mouth every morning.   amLODipine (NORVASC) 10 MG tablet TAKE 1 TABLET BY MOUTH DAILY   Ascorbic Acid (VITAMIN C WITH ROSE HIPS) 500 MG tablet Take 500 mg by mouth daily.   aspirin 81 MG tablet Take 81 mg by mouth daily.   atorvastatin (LIPITOR) 10 MG tablet TAKE 1 TABLET BY MOUTH EACH NIGHT AT BEDTIME   Blood Glucose Monitoring Suppl (ONE TOUCH ULTRA 2) w/Device KIT 1 each daily by Does not apply route.   CINNAMON PO Take 200 mg by mouth daily.   Continuous Blood Gluc Sensor (FREESTYLE LIBRE 14 DAY SENSOR) MISC 1 Device by Does not apply route every 14 (fourteen) days. Use daily PRN to check sugar. Dx E11.9 w/hyperglycemia requiring insulin   dapagliflozin propanediol (FARXIGA) 10 MG TABS tablet Take 1 tablet (10 mg total) by mouth daily before breakfast.   diclofenac sodium (VOLTAREN) 1 % GEL Uses PRN   dorzolamide-timolol (COSOPT) 22.3-6.8 MG/ML ophthalmic solution Place 1 drop into the left eye 2 (two) times daily.    Dulaglutide (TRULICITY) 3 XJ/8.8TG SOPN Inject 3 mg as directed once a week.   gemfibrozil (LOPID) 600 MG tablet Take 1 tablet (600 mg total) by mouth 2 (two) times daily.   gentamicin cream (GARAMYCIN) 0.1 % Apply 1 application topically 3 (three) times daily.   glucose blood (ONE TOUCH ULTRA TEST) test strip Check blood sugar before and after each meal and as directed. Dx E11.9   insulin glargine, 2 Unit  Dial, (TOUJEO MAX SOLOSTAR) 300 UNIT/ML Solostar Pen 175 units injected in the AM   Insulin Pen Needle (B-D UF III MINI PEN NEEDLES) 31G X 5 MM MISC 1 each by Does not apply route daily. Use to administer insulin daily   latanoprost (XALATAN) 0.005 % ophthalmic solution Place 1 drop into both eyes at bedtime.   losartan (COZAAR) 100 MG tablet TAKE 1 TABLET BY MOUTH DAILY   meloxicam (MOBIC) 15 MG tablet Take 0.5-1 tablets (7.5-15 mg total) by mouth daily as needed for pain (with food).   methylcellulose (CITRUCEL) oral powder Take 1 packet by mouth daily.   metoprolol succinate (TOPROL-XL) 25 MG 24 hr tablet TAKE 1 TABLET BY MOUTH DAILY   Multiple Vitamins-Minerals (VITRUM 50+ SENIOR MULTI PO) Take 1 tablet by mouth daily.    Tadalafil 2.5 MG TABS TAKE 1 TABLET BY MOUTH DAILY   triamcinolone cream (KENALOG) 0.1 % Apply 1 application topically 2 (two) times daily as needed.   Vibegron (GEMTESA) 75 MG TABS Take 75 mg by mouth at bedtime.   No facility-administered encounter medications on file as of 08/09/2020.   Recent Relevant Labs: Lab Results  Component Value Date/Time   HGBA1C 10.5 (A) 07/11/2020 09:13 AM   HGBA1C 9.7 (A) 04/12/2020 10:11 AM   HGBA1C 9.9 (H) 07/17/2019 10:28 AM   HGBA1C 9.6 (H) 07/15/2018 09:35 AM    Kidney Function Lab Results  Component Value Date/Time  CREATININE 1.52 (H) 07/11/2020 09:41 AM   CREATININE 1.36 02/19/2020 10:50 AM   CREATININE 1.44 12/21/2014 12:00 AM   CREATININE 1.32 05/09/2014 12:00 AM   GFR 43.36 (L) 07/11/2020 09:41 AM   GFRNONAA 40 (L) 12/12/2019 03:55 PM   GFRAA 47 (L) 12/12/2019 03:55 PM    Current antihyperglycemic regimen:  Farxiga 10 mg - 1 tablet daily  Toujeo 300 unit/ml Inject 175units every morning Trulicity 3 mg - Inject once weekly on Sundays    Patient verbally confirms he is taking the above medications as directed. Yes and Mr Dusza confirms the trulicity 19m injection once a week every Sunday    What recent  interventions/DTPs have been made to improve glycemic control: 07/11/2020 - Increased Farxiga to 10 mg daily   Have there been any recent hospitalizations or ED visits since last visit with CPP? No   Patient denies hypoglycemic symptoms, including Pale, Sweaty, Shaky, Hungry, Nervous/irritable and Vision changes   Patient denies hyperglycemic symptoms, including blurry vision, excessive thirst, fatigue, polyuria and weakness   Home BG monitoring: The patient is not checking on a daily basis due to the device is not working properly. I asked the patient if anything we could do to help and he denied any help stating he would get around to it.   On insulin? Yes     During the week, how often does your blood glucose drop below 70? Never    Are you checking your feet daily/regularly? Yes  Adherence Review: Is the patient currently on a STATIN medication? Yes Is the patient currently on ACE/ARB medication? Yes Does the patient have >5 day gap between last estimated fill dates? No  Star Rating Drugs:  Medication:  Last Fill: Day Supply Atorvastatin 143m4/20/2022 90 Farxiga 1017m4/27/2022 30 Toujeo 300unit/ml 08/06/2020 30 Losartan 100m25m22/2022 90  Follow-Up:  Pharmacist Review  MichDebbora DusP notified  Vel[facility]Avel SensorATidel[facility] Pharmacy Assistant [phone] reviewed the care management and care coordination activities outlined in this encounter and I am certifying that I agree with the content of this note. Will continue to engage patient and encourage daily BG monitoring. Patient missed last several F2F visits with me and unable to reach him by phone, thus care has been delayed. We have a face to face visit scheduled for July 2022.  MichDebbora DusarmD Clinical Pharmacist [facility]-[phone]

## 2020-08-16 ENCOUNTER — Telehealth: Payer: Self-pay | Admitting: Family Medicine

## 2020-08-16 ENCOUNTER — Other Ambulatory Visit: Payer: Self-pay | Admitting: Family Medicine

## 2020-08-16 NOTE — Telephone Encounter (Signed)
LVM for pt to rtn my call to schedule AWV with [facility].  

## 2020-08-19 ENCOUNTER — Telehealth: Payer: Self-pay | Admitting: Family Medicine

## 2020-08-19 NOTE — Telephone Encounter (Signed)
Dr. Amalia Hailey called in wanted to discuss a denial on a spinal surgery. And wanted to speak to Dr. Damita Dunnings

## 2020-08-19 NOTE — Telephone Encounter (Signed)
I think it makes sense to get input from Dr. Ellene Route in the near future then call patient about follow up here.  Thanks.

## 2020-08-19 NOTE — Telephone Encounter (Signed)
Called and left message with Dr. Wallene Huh office about patients insurance denying spinal surgery and also about his A1c being too high at this moment. I left office number for the nurse to call back and Dr. Carole Civil number for Dr. Ellene Route if needed to call him.   Do you want me to call the patient to schedule an OV here?

## 2020-08-19 NOTE — Telephone Encounter (Signed)
Called Dr. Amalia Hailey.  The issue his is A1c.  Please call Dr. Clarice Pole clinic about this so that he is aware.  Put me on the call if needed.  At that point, we'll need to contact the patient about his recent sugar readings, likely recheck A1c and go from there.  Thanks.

## 2020-08-21 ENCOUNTER — Telehealth: Payer: Self-pay | Admitting: Family Medicine

## 2020-08-21 NOTE — Telephone Encounter (Signed)
LVM for pt to rtn my call to schedule AWV with [facility].  

## 2020-08-22 ENCOUNTER — Other Ambulatory Visit: Payer: Self-pay

## 2020-08-22 ENCOUNTER — Telehealth: Payer: Self-pay | Admitting: Family Medicine

## 2020-08-22 MED ORDER — FREESTYLE LIBRE 14 DAY SENSOR MISC: 1.0000 | 3 refills | Status: DC

## 2020-08-22 NOTE — Telephone Encounter (Signed)
  LAST APPOINTMENT DATE: 08/21/2020   NEXT APPOINTMENT DATE:@7 /09/2020  MEDICATION: libre sensors #2  Due to they do not make the #1   PHARMACY: [facility]  Let patient know to contact pharmacy at the end of the day to make sure medication is ready.  Please notify patient to allow 48-72 hours to process  Encourage patient to contact the pharmacy for refills or they can request refills through [place]:   LAST REFILL:  QTY:  REFILL DATE:    OTHER COMMENTS:    Okay for refill?  Please advise

## 2020-08-22 NOTE — Telephone Encounter (Signed)
Erx sent

## 2020-08-27 ENCOUNTER — Other Ambulatory Visit: Payer: Self-pay

## 2020-08-27 ENCOUNTER — Telehealth: Payer: Self-pay | Admitting: *Deleted

## 2020-08-27 MED ORDER — ONETOUCH ULTRA 2 W/DEVICE KIT: 1.0000 | PACK | Freq: Every day | 0 refills | Status: DC

## 2020-08-27 MED ORDER — FREESTYLE LIBRE 2 READER DEVI: 0 refills | Status: DC

## 2020-08-27 MED ORDER — FREESTYLE LIBRE 2 SENSOR MISC: 6 refills | Status: DC

## 2020-08-27 NOTE — Telephone Encounter (Signed)
LMTCB to discuss what patient needs sent in; and would like to clarify medication list with patient.

## 2020-08-27 NOTE — Telephone Encounter (Signed)
Patient left a voicemail stating that he needs a freestyle  #2 sent in and not the #1. Patient stated that he needs all of the supplies to go with it also. Patient stated that he needs this done ASAP.

## 2020-08-27 NOTE — Telephone Encounter (Signed)
error 

## 2020-08-27 NOTE — Telephone Encounter (Signed)
LMTCB

## 2020-08-27 NOTE — Telephone Encounter (Signed)
Pt calling back to f/u on last message,

## 2020-08-27 NOTE — Telephone Encounter (Signed)
Patient called back and I verified with patient that he needs the freestyle libre #2 sent in. Patient was using the freestyle #1 but they no longer have this. Patient does not use onetouch device anymore and that was sent in by mistake. I have sent in the [facility] #2 reader and sensors to [place] per patient request.

## 2020-08-27 NOTE — Telephone Encounter (Signed)
Sent. Thanks.  Please check with patient and let me know if this was not correct.

## 2020-08-28 NOTE — Telephone Encounter (Signed)
Called Dr. Clarice Pole office back to check in on the message that was left. Dr. Ellene Route was out of the office last week and in the office today; was advised the message was sent and will hopefully get back to our office and/or Dr. Damita Dunnings today.

## 2020-08-29 DIAGNOSIS — R350 Frequency of micturition: Secondary | ICD-10-CM | POA: Diagnosis not present

## 2020-08-29 DIAGNOSIS — R3915 Urgency of urination: Secondary | ICD-10-CM | POA: Diagnosis not present

## 2020-08-29 DIAGNOSIS — N3941 Urge incontinence: Secondary | ICD-10-CM | POA: Diagnosis not present

## 2020-09-03 ENCOUNTER — Inpatient Hospital Stay: Admit: 2020-09-03 | Payer: HMO | Admitting: Neurological Surgery

## 2020-09-03 SURGERY — POSTERIOR LUMBAR FUSION 1 LEVEL
Anesthesia: General | Site: Back

## 2020-09-06 ENCOUNTER — Telehealth: Payer: Self-pay

## 2020-09-06 NOTE — Chronic Care Management (AMB) (Addendum)
Chronic Care Management Pharmacy Assistant   Name: [name]  MRN: [id number] DOB: 04-28-1940   Reason for Encounter: Disease State DM   Recent office visits: None since last CCM contact  Recent consult visits: None since last CCM contact  Hospital visits:  None in previous 6 months  Medications: Outpatient Encounter Medications as of 09/06/2020  Medication Sig   allopurinol (ZYLOPRIM) 300 MG tablet Take 0.5 tablets (150 mg total) by mouth every morning.   amLODipine (NORVASC) 10 MG tablet TAKE 1 TABLET BY MOUTH DAILY   Ascorbic Acid (VITAMIN C WITH ROSE HIPS) 500 MG tablet Take 500 mg by mouth daily.   aspirin 81 MG tablet Take 81 mg by mouth daily.   atorvastatin (LIPITOR) 10 MG tablet TAKE 1 TABLET BY MOUTH EACH NIGHT AT BEDTIME   CINNAMON PO Take 200 mg by mouth daily.   Continuous Blood Gluc Receiver (FREESTYLE LIBRE 2 READER) DEVI Use to check blook sugar daily. Dx E11.9   Continuous Blood Gluc Sensor (FREESTYLE LIBRE 2 SENSOR) MISC Use to check blood sugar daily. Dx E11.9   dapagliflozin propanediol (FARXIGA) 10 MG TABS tablet Take 1 tablet (10 mg total) by mouth daily before breakfast.   diclofenac sodium (VOLTAREN) 1 % GEL Uses PRN   dorzolamide-timolol (COSOPT) 22.3-6.8 MG/ML ophthalmic solution Place 1 drop into the left eye 2 (two) times daily.    Dulaglutide (TRULICITY) 3 EZ/6.6QH SOPN Inject 3 mg as directed once a week.   gemfibrozil (LOPID) 600 MG tablet Take 1 tablet (600 mg total) by mouth 2 (two) times daily.   gentamicin cream (GARAMYCIN) 0.1 % Apply 1 application topically 3 (three) times daily.   insulin glargine, 2 Unit Dial, (TOUJEO MAX SOLOSTAR) 300 UNIT/ML Solostar Pen 175 units injected in the AM   Insulin Pen Needle (B-D UF III MINI PEN NEEDLES) 31G X 5 MM MISC 1 each by Does not apply route daily. Use to administer insulin daily   latanoprost (XALATAN) 0.005 % ophthalmic solution Place 1 drop into both eyes at bedtime.   losartan (COZAAR) 100  MG tablet TAKE 1 TABLET BY MOUTH DAILY   meloxicam (MOBIC) 15 MG tablet Take 0.5-1 tablets (7.5-15 mg total) by mouth daily as needed for pain (with food).   methylcellulose (CITRUCEL) oral powder Take 1 packet by mouth daily.   metoprolol succinate (TOPROL-XL) 25 MG 24 hr tablet TAKE 1 TABLET BY MOUTH DAILY   Multiple Vitamins-Minerals (VITRUM 50+ SENIOR MULTI PO) Take 1 tablet by mouth daily.    Tadalafil 2.5 MG TABS TAKE 1 TABLET BY MOUTH DAILY   triamcinolone cream (KENALOG) 0.1 % Apply 1 application topically 2 (two) times daily as needed.   Vibegron (GEMTESA) 75 MG TABS Take 75 mg by mouth at bedtime.   No facility-administered encounter medications on file as of 09/06/2020.   Recent Relevant Labs: Lab Results  Component Value Date/Time   HGBA1C 10.5 (A) 07/11/2020 09:13 AM   HGBA1C 9.7 (A) 04/12/2020 10:11 AM   HGBA1C 9.9 (H) 07/17/2019 10:28 AM   HGBA1C 9.6 (H) 07/15/2018 09:35 AM    Kidney Function Lab Results  Component Value Date/Time   CREATININE 1.52 (H) 07/11/2020 09:41 AM   CREATININE 1.36 02/19/2020 10:50 AM   CREATININE 1.44 12/21/2014 12:00 AM   CREATININE 1.32 05/09/2014 12:00 AM   GFR 43.36 (L) 07/11/2020 09:41 AM   GFRNONAA 40 (L) 12/12/2019 03:55 PM   GFRAA 47 (L) 12/12/2019 03:55 PM    Current antihyperglycemic regimen:  Farxiga 10 mg - 1 tablet daily Toujeo 300 unit/ml Inject 175units every morning Trulicity 3 mg - Inject once weekly on Sundays      Patient verbally confirms he is taking the above medications as directed. Yes the patient ncreased  [name] to 10mg  1 tablet daily 07/18/20 per PCP  What recent interventions/DTPs have been made to improve glycemic control: 07/18/20-PCP doubled the dose of Farxiga to 10mg  1 tablet daily  The patient reports he works hard on keeping his BG's below 150.  Have there been any recent hospitalizations or ED visits since last visit with CPP? No  Patient denies hypoglycemic symptoms, including Pale, Sweaty, Shaky,  Hungry, Nervous/irritable, and Vision changes  Patient denies hyperglycemic symptoms, including blurry vision, excessive thirst, fatigue, polyuria, and weakness  How often are you checking your blood sugar? in the morning before eating or drinking  What are your blood sugars ranging?  Fasting:  09/06/20-140           09/05/20- 150           09/04/20- 158           09/03/20- 140  On insulin? YesToujeo 300 unit/ml Inject 175units every morning  During the week, how often does your blood glucose drop below 70? Never  Are you checking your feet daily/regularly? Yes  Adherence Review: Is the patient currently on a STATIN medication? Yes Is the patient currently on ACE/ARB medication? Yes Does the patient have >5 day gap between last estimated fill dates? No  Star Rating Drugs: Medication:  Last Fill: Day Supply Tougeo 300unit/ml 09/02/20  30 Farxiga 10mg   07/30/20  30 Losartan 100mg  7/34/28 90 Trulicity 3mg /0.77ml 06/07/20 PAP Atorvastatin10mg  07/17/20 90  The patient was encouraged to keep recording his BG's and take to the follow up appointment with Debbora Dus CPP  10/03/20 at 9:30am in the office.  The patient was reminded of his PCP follow up 10/10/20 at 9:00am with Dr.Duncan in the office.  Follow-Up:  Pharmacist Review  Debbora Dus, CPP notified  Avel Sensor [facility] Clinical Pharmacy Assistant [phone]  I have reviewed the care management and care coordination activities outlined in this encounter and I am certifying that I agree with the content of this note. No further action required.  Debbora Dus, PharmD Clinical Pharmacist [facility] at [facility] [phone]

## 2020-09-09 ENCOUNTER — Other Ambulatory Visit: Payer: Self-pay

## 2020-09-09 ENCOUNTER — Ambulatory Visit: Payer: HMO | Admitting: Podiatry

## 2020-09-09 ENCOUNTER — Encounter: Payer: Self-pay | Admitting: Podiatry

## 2020-09-09 DIAGNOSIS — E1149 Type 2 diabetes mellitus with other diabetic neurological complication: Secondary | ICD-10-CM

## 2020-09-09 DIAGNOSIS — L84 Corns and callosities: Secondary | ICD-10-CM | POA: Diagnosis not present

## 2020-09-09 DIAGNOSIS — E1140 Type 2 diabetes mellitus with diabetic neuropathy, unspecified: Secondary | ICD-10-CM

## 2020-09-10 NOTE — Progress Notes (Signed)
Subjective:   Patient ID: [name], male   DOB: 80 y.o.   MRN: [id number]   HPI Patient states that he does have pain with the lesion on the left and he does have neuropathy   ROS      Objective:  Physical Exam  Neurovascular status intact with keratotic lesions of the first metatarsal head left painful when pressed     Assessment:  Chronic lesion long-term diabetic neuropathy     Plan:  Sterile debridement of lesion no iatrogenic bleeding reappoint routine care continue shoe gear modification     Patient presents stating this lesion on my left is sore and I do have diabetes with neuropathy

## 2020-09-11 ENCOUNTER — Telehealth: Payer: Self-pay | Admitting: Family Medicine

## 2020-09-11 NOTE — Telephone Encounter (Signed)
Please check with patient about his recent glucose readings and let me know how he is doing.  Thanks.

## 2020-09-12 NOTE — Telephone Encounter (Signed)
LMTCB

## 2020-09-12 NOTE — Telephone Encounter (Signed)
Patient called back stating his sugar readings have been in 110-150 range, fasting and non fasting. Checks his sugars about every hour. No current symptoms of concern

## 2020-09-13 NOTE — Telephone Encounter (Signed)
Noted.  Glad to hear.  We should be able to check A1c at next OV here.  I thank him for his effort.

## 2020-09-13 NOTE — Telephone Encounter (Signed)
Patient aware that A1c will be checked at next OV

## 2020-09-16 DIAGNOSIS — H43813 Vitreous degeneration, bilateral: Secondary | ICD-10-CM | POA: Diagnosis not present

## 2020-09-16 DIAGNOSIS — H0102B Squamous blepharitis left eye, upper and lower eyelids: Secondary | ICD-10-CM | POA: Diagnosis not present

## 2020-09-16 DIAGNOSIS — H401123 Primary open-angle glaucoma, left eye, severe stage: Secondary | ICD-10-CM | POA: Diagnosis not present

## 2020-09-16 DIAGNOSIS — H401111 Primary open-angle glaucoma, right eye, mild stage: Secondary | ICD-10-CM | POA: Diagnosis not present

## 2020-09-16 DIAGNOSIS — Z794 Long term (current) use of insulin: Secondary | ICD-10-CM | POA: Diagnosis not present

## 2020-09-16 DIAGNOSIS — H0102A Squamous blepharitis right eye, upper and lower eyelids: Secondary | ICD-10-CM | POA: Diagnosis not present

## 2020-09-16 DIAGNOSIS — Z961 Presence of intraocular lens: Secondary | ICD-10-CM | POA: Diagnosis not present

## 2020-09-16 DIAGNOSIS — H2511 Age-related nuclear cataract, right eye: Secondary | ICD-10-CM | POA: Diagnosis not present

## 2020-09-16 DIAGNOSIS — H02135 Senile ectropion of left lower eyelid: Secondary | ICD-10-CM | POA: Diagnosis not present

## 2020-09-16 DIAGNOSIS — H02132 Senile ectropion of right lower eyelid: Secondary | ICD-10-CM | POA: Diagnosis not present

## 2020-09-16 DIAGNOSIS — E119 Type 2 diabetes mellitus without complications: Secondary | ICD-10-CM | POA: Diagnosis not present

## 2020-09-16 DIAGNOSIS — H04123 Dry eye syndrome of bilateral lacrimal glands: Secondary | ICD-10-CM | POA: Diagnosis not present

## 2020-09-20 NOTE — Progress Notes (Addendum)
Contacted patient again on 09/20/20 for updated blood glucose log.  BG log Fasting  09/17/20  300               09/20/20  250  The patient went out to eat lunch with daughter where he had shrimp tacos with a little rice and the BG's have been high since but are coming down, he has not made any changes with medications. He has not been feeling any hyperglycemic symptoms. He will continue to monitor. The patient is anticipating back surgery in the future.  Debbora Dus, CPP notified  Avel Sensor, [place] Assistant [phone]

## 2020-09-26 DIAGNOSIS — R3915 Urgency of urination: Secondary | ICD-10-CM | POA: Diagnosis not present

## 2020-09-26 DIAGNOSIS — N3941 Urge incontinence: Secondary | ICD-10-CM | POA: Diagnosis not present

## 2020-09-26 DIAGNOSIS — R350 Frequency of micturition: Secondary | ICD-10-CM | POA: Diagnosis not present

## 2020-09-27 ENCOUNTER — Telehealth: Payer: Self-pay

## 2020-09-27 ENCOUNTER — Other Ambulatory Visit: Payer: Self-pay | Admitting: Family Medicine

## 2020-09-27 NOTE — Chronic Care Management (AMB) (Addendum)
Chronic Care Management Pharmacy Assistant   Name: [name]  MRN: [id number] DOB: 12/26/1940   Reason for Encounter: Reminder Call  Recent office visits:  None since last CCM contact  Recent consult visits:  09/09/20 - Podiatry  no medication changes  Hospital visits:  None in previous 6 months  Medications: Outpatient Encounter Medications as of 09/27/2020  Medication Sig   allopurinol (ZYLOPRIM) 300 MG tablet Take 0.5 tablets (150 mg total) by mouth every morning.   amLODipine (NORVASC) 10 MG tablet TAKE 1 TABLET BY MOUTH DAILY   Ascorbic Acid (VITAMIN C WITH ROSE HIPS) 500 MG tablet Take 500 mg by mouth daily.   aspirin 81 MG tablet Take 81 mg by mouth daily.   atorvastatin (LIPITOR) 10 MG tablet TAKE 1 TABLET BY MOUTH EACH NIGHT AT BEDTIME   CINNAMON PO Take 200 mg by mouth daily.   Continuous Blood Gluc Receiver (FREESTYLE LIBRE 2 READER) DEVI Use to check blook sugar daily. Dx E11.9   Continuous Blood Gluc Sensor (FREESTYLE LIBRE 2 SENSOR) MISC Use to check blood sugar daily. Dx E11.9   dapagliflozin propanediol (FARXIGA) 10 MG TABS tablet Take 1 tablet (10 mg total) by mouth daily before breakfast.   diclofenac sodium (VOLTAREN) 1 % GEL Uses PRN   dorzolamide-timolol (COSOPT) 22.3-6.8 MG/ML ophthalmic solution Place 1 drop into the left eye 2 (two) times daily.    Dulaglutide (TRULICITY) 3 BP/1.0CH SOPN Inject 3 mg as directed once a week.   gemfibrozil (LOPID) 600 MG tablet Take 1 tablet (600 mg total) by mouth 2 (two) times daily.   gentamicin cream (GARAMYCIN) 0.1 % Apply 1 application topically 3 (three) times daily.   insulin glargine, 2 Unit Dial, (TOUJEO MAX SOLOSTAR) 300 UNIT/ML Solostar Pen 175 units injected in the AM   Insulin Pen Needle (B-D UF III MINI PEN NEEDLES) 31G X 5 MM MISC 1 each by Does not apply route daily. Use to administer insulin daily   latanoprost (XALATAN) 0.005 % ophthalmic solution Place 1 drop into both eyes at bedtime.   losartan  (COZAAR) 100 MG tablet TAKE 1 TABLET BY MOUTH DAILY   meloxicam (MOBIC) 15 MG tablet Take 0.5-1 tablets (7.5-15 mg total) by mouth daily as needed for pain (with food).   methylcellulose (CITRUCEL) oral powder Take 1 packet by mouth daily.   metoprolol succinate (TOPROL-XL) 25 MG 24 hr tablet TAKE 1 TABLET BY MOUTH DAILY   Multiple Vitamins-Minerals (VITRUM 50+ SENIOR MULTI PO) Take 1 tablet by mouth daily.    Tadalafil 2.5 MG TABS TAKE 1 TABLET BY MOUTH DAILY   triamcinolone cream (KENALOG) 0.1 % Apply 1 application topically 2 (two) times daily as needed.   Vibegron (GEMTESA) 75 MG TABS Take 75 mg by mouth at bedtime.   No facility-administered encounter medications on file as of 09/27/2020.   [name] was contacted to remind him of his upcoming office visit with Debbora Dus on 10/03/2020 at 9:30am. Patient was reminded to have all medications, supplements and any blood glucose and blood pressure readings available for review at appointment.   Are you having any problems with your medications? No  Do you have any concerns you like to discuss with the pharmacist? No    Star Rating Drugs: Medication:  Last Fill: Day Supply Atorvastatin 10 mg 07/17/20 90 Farxiga 5mg   09/12/20 30 Toujeo   09/02/20  30 Losartan 100mg  09/14/20 [place], CPP notified  Avel Sensor, [place]  Assistant [phone]  I have reviewed the care management and care coordination activities outlined in this encounter and I am certifying that I agree with the content of this note. No further action required.  Debbora Dus, PharmD Clinical Pharmacist [facility] at [facility] [phone]

## 2020-10-03 ENCOUNTER — Other Ambulatory Visit: Payer: Self-pay

## 2020-10-03 ENCOUNTER — Ambulatory Visit (INDEPENDENT_AMBULATORY_CARE_PROVIDER_SITE_OTHER): Payer: HMO

## 2020-10-03 ENCOUNTER — Telehealth: Payer: Self-pay

## 2020-10-03 DIAGNOSIS — E669 Obesity, unspecified: Secondary | ICD-10-CM

## 2020-10-03 DIAGNOSIS — E1169 Type 2 diabetes mellitus with other specified complication: Secondary | ICD-10-CM | POA: Diagnosis not present

## 2020-10-03 DIAGNOSIS — I10 Essential (primary) hypertension: Secondary | ICD-10-CM | POA: Diagnosis not present

## 2020-10-03 DIAGNOSIS — E785 Hyperlipidemia, unspecified: Secondary | ICD-10-CM | POA: Diagnosis not present

## 2020-10-03 MED ORDER — DAPAGLIFLOZIN PROPANEDIOL 10 MG PO TABS: 10.0000 mg | ORAL_TABLET | Freq: Every day | ORAL | 3 refills | Status: DC

## 2020-10-03 MED ORDER — FREESTYLE LIBRE 2 SENSOR MISC: 6 refills | Status: DC

## 2020-10-03 NOTE — Progress Notes (Signed)
Chronic Care Management Pharmacy Note  10/11/2020 Name:  [name] MRN:  [id number] DOB:  February 07, 1941  Subjective: [name] is an 80 y.o. year old male who is a primary patient of Damita Dunnings, Elveria Rising, MD.  The CCM team was consulted for assistance with disease management and care coordination needs.    Engaged with patient face to face for follow up visit in response to provider referral for pharmacy case management and/or care coordination services.  Patient did not bring medications or BG log to this appointment.   Consent to Services:  The patient was given information about Chronic Care Management services, agreed to services, and gave verbal consent prior to initiation of services.  Please see initial visit note for detailed documentation.   Patient Care Team: Tonia Ghent, MD as PCP - General (Family Medicine) Clent Jacks, MD as Consulting Physician (Ophthalmology) Renato Shin, MD as Consulting Physician (Endocrinology) Armbruster, Carlota Raspberry, MD as Consulting Physician (Gastroenterology) Irene Limbo, MD as Consulting Physician (Plastic Surgery) Garvin Fila, MD as Consulting Physician (Neurology) Debbora Dus, [facility] as Pharmacist (Pharmacist) Kristeen Miss, MD as Consulting Physician (Neurosurgery)  Recent office visits 09/20/20 - CCM BG Log - Fasting  09/17/20  300, 09/20/20  250 09/11/20 - PCP telephone call - Patient called back stating his sugar readings have been in 110-150 range, fasting and non fasting. Checks his sugars about every hour. No current symptoms of concern 07/18/20 - PCP - I would suggest increasing his [name] to 10 mg.  Let me know if he needs a new rx. Then if he tolerates that and if needed, he may end up needing to increase Trulicity to 4.5 mg weekly.  I wouldn't change trulicity yet.  I would have him update me on the higher dose of trulicity in about 2 weeks.   07/11/20 - PCP - Taking Trulicity 3 mg a week x 1 month, Farxiga 5 mg a day  and insulin 175 units a day in AM.  I want to consider options in the meantime.  Recheck fructosamine CBC and be met today.  See notes on labs.  A1c was higher than expected.   Recent consult visits: 09/09/20 [facility] visits: None in previous 6 months  Objective:  Lab Results  Component Value Date   CREATININE 1.40 10/10/2020   BUN 29 (H) 10/10/2020   GFR 47.77 (L) 10/10/2020   GFRNONAA 40 (L) 12/12/2019   GFRAA 47 (L) 12/12/2019   NA 129 (L) 10/10/2020   K 3.7 10/10/2020   CALCIUM 10.6 (H) 10/10/2020   CO2 21 10/10/2020   GLUCOSE 306 (H) 10/10/2020    Lab Results  Component Value Date/Time   HGBA1C 10.0 (A) 10/10/2020 09:24 AM   HGBA1C 10.5 (A) 07/11/2020 09:13 AM   HGBA1C 9.9 (H) 07/17/2019 10:28 AM   HGBA1C 9.6 (H) 07/15/2018 09:35 AM   FRUCTOSAMINE 392 (H) 07/11/2020 09:41 AM   GFR 47.77 (L) 10/10/2020 09:51 AM   GFR 43.36 (L) 07/11/2020 09:41 AM    Last diabetic Eye exam:  Lab Results  Component Value Date/Time   HMDIABEYEEXA No Retinopathy 01/01/2020 12:00 AM    Last diabetic Foot exam: 12/28/19 - PCP   Lab Results  Component Value Date   CHOL 179 07/17/2019   HDL 37.70 (L) 07/17/2019   LDLCALC 82 05/09/2014   LDLDIRECT 57.0 07/17/2019   TRIG (H) 07/17/2019    484.0 Triglyceride is over 400; calculations on Lipids are invalid.  CHOLHDL 5 07/17/2019    Hepatic Function Latest Ref Rng & Units 07/17/2019 07/15/2018 08/18/2016  Total Protein 6.0 - 8.3 g/dL 7.0 6.9 7.7  Albumin 3.5 - 5.2 g/dL 3.9 3.8 3.9  AST 0 - 37 U/L 38(H) 19 30  ALT 0 - 53 U/L 33 27 29  Alk Phosphatase 39 - 117 U/L 37(L) 36(L) 36(L)  Total Bilirubin 0.2 - 1.2 mg/dL 0.6 0.5 0.7    Lab Results  Component Value Date/Time   TSH 4.48 07/11/2020 09:41 AM    CBC Latest Ref Rng & Units 07/11/2020 12/12/2019 07/15/2018  WBC 4.0 - 10.5 K/uL 7.3 8.7 7.4  Hemoglobin 13.0 - 17.0 g/dL 15.4 14.6 14.4  Hematocrit 39.0 - 52.0 % 45.0 43.9 41.6  Platelets 150.0 - 400.0 K/uL 316.0 348  262.0    No results found for: VD25OH  Clinical ASCVD: No  The 10-year ASCVD risk score Mikey Bussing DC Jr., et al., 2013) is: 62.7%   Values used to calculate the score:     Age: 33 years     Sex: Male     Is Non-Hispanic African American: No     Diabetic: Yes     Tobacco smoker: No     Systolic Blood Pressure: 329 mmHg     Is BP treated: Yes     HDL Cholesterol: 37.7 mg/dL     Total Cholesterol: 179 mg/dL    Depression screen [facility] 2/9 02/19/2020 12/15/2019 07/20/2019  Decreased Interest 0 0 0  Down, Depressed, Hopeless 0 0 0  PHQ - 2 Score 0 0 0  Altered sleeping - - -  Tired, decreased energy - - -  Change in appetite - - -  Feeling bad or failure about yourself  - - -  Trouble concentrating - - -  Moving slowly or fidgety/restless - - -  Suicidal thoughts - - -  PHQ-9 Score - - -  Difficult doing work/chores - - -  Some recent data might be hidden    Social History   Tobacco Use  Smoking Status Never  Smokeless Tobacco Never   BP Readings from Last 3 Encounters:  10/10/20 136/72  07/11/20 130/74  04/12/20 132/80   Pulse Readings from Last 3 Encounters:  10/10/20 89  07/11/20 82  04/12/20 83   Wt Readings from Last 3 Encounters:  10/10/20 256 lb (116.1 kg)  07/11/20 256 lb (116.1 kg)  04/12/20 263 lb (119.3 kg)   BMI Readings from Last 3 Encounters:  10/10/20 34.72 kg/m  07/11/20 34.72 kg/m  04/12/20 35.67 kg/m    Assessment/Interventions: Review of patient past medical history, allergies, medications, health status, including review of consultants reports, laboratory and other test data, was performed as part of comprehensive evaluation and provision of chronic care management services.   SDOH:  (Social Determinants of Health) assessments and interventions performed: March 2022    SDOH Interventions   Flowsheet Row Most Recent Value  SDOH Interventions    Financial Strain Interventions Intervention Not Indicated  [Trulicity through patient assistance.  Other medications affordable.]       SDOH Screenings   Alcohol Screen: Not on file  Depression (PHQ2-9): Low Risk    PHQ-2 Score: 0  Financial Resource Strain: Low Risk    Difficulty of Paying Living Expenses: Not very hard  Food Insecurity: Not on file  Housing: Not on file  Physical Activity: Not on file  Social Connections: Not on file  Stress: Not on file  Tobacco Use: Low Risk  Smoking Tobacco Use: Never   Smokeless Tobacco Use: Never  Transportation Needs: Not on file    Patient Care Plan: CCM Pharmacy Care Plan     Problem Identified: CHL AMB "PATIENT-SPECIFIC PROBLEM"      Long-Range Goal: Disease Management   Start Date: 06/04/2020  Priority: High  Note:     CCM Care Plan Current Barriers:  Unable to achieve control of diabetes   Not keeping a log of BG readings  Pharmacist Clinical Goal(s):  Over the next 30 days, patient will adhere to plan to optimize therapeutic regimen for diabetes as evidenced by report of adherence to recommended medication management changes through collaboration with PharmD and provider.  Keep written log of BG readings twice daily   Interventions: 1:1 collaboration with Tonia Ghent, MD regarding development and update of comprehensive plan of care as evidenced by provider attestation and co-signature Inter-disciplinary care team collaboration (see longitudinal plan of care) Comprehensive medication review performed; medication list updated in electronic medical record  Hypertension (BP goal <140/90) -Controlled per clinic readings within goal -Current treatment: Losartan 100 mg - 1 tablet daily Metoprolol Succinate ER - 1 tablet daily  Amlodipine 10 mg - 1 tablet daily -Medications previously tried: none reported  -Current home readings: none reported -Denies hypotensive/hypertensive symptoms -Reviewed refill history, no gaps in adherence  -Recommended to continue current medication  Hyperlipidemia: (LDL goal <  70) -Not ideally controlled - TG elevated, due for a repeat lipid panel -Current treatment: Atorvastatin 10 mg - 1 tablet daily  Gemfibrozil 600 mg - 1 tablet BID Aspirin 81 mg - 1 tablet daily -Medications previously tried: none - TG likely elevated due to uncontrolled diabetes. Continue to work on diabetes control. No history of statin intolerance so we could increase statin. He is past due for gemfibrozil refill (filled 06/15/20 90 DS), statin refilled 07/17/20 90 DS. -Recommended to continue current medication  Diabetes (A1c goal <8%) -Query uncontrolled - A1c 10.5% -Pt seen in person but did not bring his BG log or Libre today. -Current medications: Farxiga 5 mg - 1 tablet daily (pt has not increased to 10 mg daily, he may have been taking 2 tablets of 5 mg but he was not able to confirm this today) Toujeo Max Solostar 300 unit/mL - 175 units every morning  Trulicity 3 mg - Inject once weekly on Sundays *Increased from 1.5 mg to 3 mg 06/06/20* Cinnamon 200 mg daily -Medications previously tried: Januvia (replaced with Trulicity), Metformin (GI intolerance) -Current home glucose readings - Freestyle Libre, checking 2-3 times daily but does not always wear the [place], He is only getting a 14 day supply every 30 days from his pharmacy. Fasting ranging 90-250 per patient report -Denies hypoglycemic/hyperglycemic symptoms -Current meal patterns:  Breakfast - eggs and meat Lunch - skips Supper - eats out often, pizza, sandwich or salad Drinks - water, diet drinks  -Current exercise: bicycles at the [facility] -We reviewed the diabetic plate method in detail. He is still motivated to lower BG in order to proceed with back surgery. -Will have PCP send in [place] 10 mg to [place] since patient never started this. Follow up with PCP in 1 week as scheduled.  Patient Goals/Self-Care Activities Over the next 30 days, patient will:  - check glucose before breakfast and 2 hours after meals,  document, and provide at future appointments  Follow Up Plan: PCP visit scheduled for October 10, 2020    CCM Follow Up: --DM BG log review  in 30 days   Medication Assistance: Trulicity obtained through [facility] medication assistance program.  Enrollment ends end of 2022  Star Rating Drugs: Medication:                Last Fill:         Day Supply Atorvastatin 10 mg      07/17/20            90 Farxiga 19m                09/12/20            30 Toujeo                         09/02/20              30 Losartan 1054m         09/14/20            90[place] Valley[facility]arm[place] Clinical Pharmacist [phone]

## 2020-10-03 NOTE — Addendum Note (Signed)
Addended by: Sherrilee Gilles B on: 10/03/2020 04:44 PM   Modules accepted: Orders

## 2020-10-03 NOTE — Telephone Encounter (Signed)
Both rxs sent to [facility].

## 2020-10-03 NOTE — Telephone Encounter (Signed)
Patient's would like 90 day supply of his [employer]. Can we send a new Rx to [facility] for [employer] quantity #6?  Can you also send Farxiga 10 mg to [facility]? They do not have this dose on file.  Debbora Dus, PharmD Clinical Pharmacist [facility] at [facility] [phone]

## 2020-10-08 ENCOUNTER — Telehealth: Payer: Self-pay

## 2020-10-08 NOTE — Telephone Encounter (Signed)
Patient brought in a paper from his wife's dermatologist stating mupirocin ointment 2% works better for him than gentamicin for infection. No further detail provided. He has a follow up visit with PCP 10/10/20. Assuming he will discuss further at this appt.  Debbora Dus, PharmD Clinical Pharmacist [facility] at [facility] [phone]

## 2020-10-09 NOTE — Telephone Encounter (Signed)
Noted. Thanks.

## 2020-10-10 ENCOUNTER — Encounter: Payer: Self-pay | Admitting: Family Medicine

## 2020-10-10 ENCOUNTER — Other Ambulatory Visit: Payer: Self-pay

## 2020-10-10 ENCOUNTER — Ambulatory Visit (INDEPENDENT_AMBULATORY_CARE_PROVIDER_SITE_OTHER): Payer: HMO | Admitting: Family Medicine

## 2020-10-10 VITALS — BP 136/72 | HR 89 | Temp 97.8°F | Ht 72.0 in | Wt 256.0 lb

## 2020-10-10 DIAGNOSIS — E669 Obesity, unspecified: Secondary | ICD-10-CM | POA: Diagnosis not present

## 2020-10-10 DIAGNOSIS — M549 Dorsalgia, unspecified: Secondary | ICD-10-CM

## 2020-10-10 DIAGNOSIS — R238 Other skin changes: Secondary | ICD-10-CM

## 2020-10-10 DIAGNOSIS — E1169 Type 2 diabetes mellitus with other specified complication: Secondary | ICD-10-CM

## 2020-10-10 LAB — BASIC METABOLIC PANEL
BUN: 29 mg/dL — ABNORMAL HIGH (ref 6–23)
CO2: 21 mEq/L (ref 19–32)
Calcium: 10.6 mg/dL — ABNORMAL HIGH (ref 8.4–10.5)
Chloride: 95 mEq/L — ABNORMAL LOW (ref 96–112)
Creatinine, Ser: 1.4 mg/dL (ref 0.40–1.50)
GFR: 47.77 mL/min — ABNORMAL LOW (ref >60.00)
Glucose, Bld: 306 mg/dL — ABNORMAL HIGH (ref 70–99)
Potassium: 3.7 mEq/L (ref 3.5–5.1)
Sodium: 129 mEq/L — ABNORMAL LOW (ref 135–145)

## 2020-10-10 LAB — POCT GLYCOSYLATED HEMOGLOBIN (HGB A1C): Hemoglobin A1C: 10 % — AB (ref 4.0–5.6)

## 2020-10-10 MED ORDER — MELOXICAM 15 MG PO TABS: 7.5000 mg | ORAL_TABLET | Freq: Every day | ORAL | 5 refills | Status: DC | PRN

## 2020-10-10 MED ORDER — MUPIROCIN 2 % EX OINT: 1.0000 "application " | TOPICAL_OINTMENT | Freq: Two times a day (BID) | CUTANEOUS | 1 refills | Status: DC

## 2020-10-10 NOTE — Patient Instructions (Signed)
I would increase your insulin by 5 units every few days day, until your AM sugar is below 200.    180 for a few days.  185 or a few days.  190 for a few days.  195 for a few days.  If needed.  Then update me in about 10-14 days.    Take care.  Glad to see you. Go to the lab on the way out.   If you have mychart we'll likely use that to update you.

## 2020-10-10 NOTE — Progress Notes (Signed)
This visit occurred during the SARS-CoV-2 public health emergency.  Safety protocols were in place, including screening questions prior to the visit, additional usage of staff PPE, and extensive cleaning of exam room while observing appropriate contact time as indicated for disinfecting solutions.  Diabetes:  Using medications without difficulties: yes Hypoglycemic episodes: no Hyperglycemic episodes: see below.   Feet problems: no tingling.  No sores.  Blood Sugars averaging: 333 at the OV on his meter.  This was typical for patient.   eye exam within last year: yes Recent inc in [place] from 5 to 10mg .    He had been using bactroban on a sore on his backside bilaterally.  It will wax and wane. Better with decreasing pressure locally.   His back surgery had been put on hold given his A1c elevation, d/w pt.  Still taking meloxicam for pain at baseline.  Recheck creatinine pending.  PMH and SH reviewed  Meds, vitals, and allergies reviewed.   ROS: Per HPI unless specifically indicated in ROS section   GEN: nad, alert and oriented HEENT: ncat NECK: supple w/o LA CV: rrr. PULM: ctab, no inc wob ABD: soft, +bs EXT: no edema SKIN: irritation but no ulceration on the L>R buttock near the midline.

## 2020-10-11 NOTE — Patient Instructions (Signed)
Dear [name],  Below is a summary of the goals we discussed during our follow up appointment on October 03, 2020. Please contact me anytime with questions or concerns.   Visit Information  Patient Care Plan: CCM Pharmacy Care Plan     Problem Identified: CHL AMB "PATIENT-SPECIFIC PROBLEM"      Long-Range Goal: Disease Management   Start Date: 06/04/2020  Priority: High  Note:     CCM Care Plan Current Barriers:  Unable to achieve control of diabetes   Not keeping a log of BG readings  Pharmacist Clinical Goal(s):  Over the next 30 days, patient will adhere to plan to optimize therapeutic regimen for diabetes as evidenced by report of adherence to recommended medication management changes through collaboration with PharmD and provider.  Keep written log of BG readings twice daily   Interventions: 1:1 collaboration with Tonia Ghent, MD regarding development and update of comprehensive plan of care as evidenced by provider attestation and co-signature Inter-disciplinary care team collaboration (see longitudinal plan of care) Comprehensive medication review performed; medication list updated in electronic medical record  Hypertension (BP goal <140/90) -Controlled per clinic readings within goal -Current treatment: Losartan 100 mg - 1 tablet daily Metoprolol Succinate ER - 1 tablet daily  Amlodipine 10 mg - 1 tablet daily -Medications previously tried: none reported  -Current home readings: none reported -Denies hypotensive/hypertensive symptoms -Reviewed refill history, no gaps in adherence  -Recommended to continue current medication  Hyperlipidemia: (LDL goal < 70) -Not ideally controlled - TG elevated, due for a repeat lipid panel -Current treatment: Atorvastatin 10 mg - 1 tablet daily  Gemfibrozil 600 mg - 1 tablet BID Aspirin 81 mg - 1 tablet daily -Medications previously tried: none - TG likely elevated due to uncontrolled diabetes. Continue to work on  diabetes control. No history of statin intolerance so we could increase statin. He is past due for gemfibrozil refill (filled 06/15/20 90 DS), statin refilled 07/17/20 90 DS. -Recommended to continue current medication  Diabetes (A1c goal <8%) -Query uncontrolled - A1c 10.5% -Pt seen in person but did not bring his BG log or Libre today. -Current medications: Farxiga 5 mg - 1 tablet daily (pt has not increased to 10 mg daily, he may have been taking 2 tablets of 5 mg but he was not able to confirm this today) Toujeo Max Solostar 300 unit/mL - 175 units every morning  Trulicity 3 mg - Inject once weekly on Sundays *Increased from 1.5 mg to 3 mg 06/06/20* Cinnamon 200 mg daily -Medications previously tried: Januvia (replaced with Trulicity), Metformin (GI intolerance) -Current home glucose readings - Freestyle Libre, checking 2-3 times daily but does not always wear the [place], He is only getting a 14 day supply every 30 days from his pharmacy. Fasting ranging 90-250 per patient report -Denies hypoglycemic/hyperglycemic symptoms -Current meal patterns:  Breakfast - eggs and meat Lunch - skips Supper - eats out often, pizza, sandwich or salad Drinks - water, diet drinks  -Current exercise: bicycles at the [facility] -We reviewed the diabetic plate method in detail. He is still motivated to lower BG in order to proceed with back surgery. -Will have PCP send in [place] 10 mg to [place] since patient never started this. Follow up with PCP in 1 week as scheduled.  Patient Goals/Self-Care Activities Over the next 30 days, patient will:  - check glucose before breakfast and 2 hours after meals, document, and provide at future appointments  Follow Up Plan: PCP  visit scheduled for October 10, 2020      Patient verbalizes understanding of instructions provided today and agrees to view in [place].   Debbora Dus, PharmD Clinical Pharmacist [facility] at [facility] [phone]

## 2020-10-13 NOTE — Assessment & Plan Note (Signed)
Recent increase in [place] from 5 to 10 mg.  Discussed with patient about increasing insulin gradually.  See after visit summary.  See notes on labs.

## 2020-10-13 NOTE — Assessment & Plan Note (Signed)
He has been taking meloxicam for pain.  Recheck creatinine today.  See notes on labs.  His back surgery has been put on hold given his A1c.

## 2020-10-13 NOTE — Assessment & Plan Note (Signed)
No ulceration.  Okay to use Bactroban if it gets more irritated.  Discussed limiting pressure on the area.  Getting his sugar under control would help with healing.  Getting his sugar under control with help with him having back surgery which would then help with his mobility.  Discussed.

## 2020-10-16 LAB — INSULIN ANTIBODIES, BLOOD: Insulin Antibodies, Human: <0.4 U/mL (ref <0.4)

## 2020-10-16 LAB — C-PEPTIDE: C-Peptide: 3.55 ng/mL (ref 0.80–3.85)

## 2020-10-25 ENCOUNTER — Other Ambulatory Visit: Payer: Self-pay | Admitting: Family Medicine

## 2020-10-25 NOTE — Telephone Encounter (Signed)
Last refilled on 04/02/20 #30 with 5 refills. LOV 10/10/20 for routine follow up  No future appointments

## 2020-10-27 NOTE — Telephone Encounter (Signed)
Sent. Thanks.   

## 2020-10-29 ENCOUNTER — Other Ambulatory Visit: Payer: Self-pay | Admitting: Family Medicine

## 2020-10-30 ENCOUNTER — Telehealth: Payer: Self-pay | Admitting: Nurse Practitioner

## 2020-10-30 ENCOUNTER — Other Ambulatory Visit: Payer: Self-pay

## 2020-10-30 ENCOUNTER — Ambulatory Visit (INDEPENDENT_AMBULATORY_CARE_PROVIDER_SITE_OTHER): Payer: HMO | Admitting: Nurse Practitioner

## 2020-10-30 ENCOUNTER — Encounter: Payer: Self-pay | Admitting: Nurse Practitioner

## 2020-10-30 VITALS — BP 130/78 | HR 100 | Temp 98.4°F | Ht 72.0 in | Wt 261.0 lb

## 2020-10-30 DIAGNOSIS — L89321 Pressure ulcer of left buttock, stage 1: Secondary | ICD-10-CM

## 2020-10-30 NOTE — Assessment & Plan Note (Signed)
Patient states that dealing with this for 1 to 1-1/2 years.  States has been evaluated for in the past.  States he went to the wound clinic in the past also several years ago.  Recently evaluated in office on 10-10-2020.  Looking at last note had discoloration similar to what was in office but with no ulceration.  Does have beginnings of ulceration today in office.  Patient has several comorbidities diabetes being 1 along with back pain and problems.  Patient is unable to flat.  States he is most the time in a type of chair or recliner.  Did discuss alternating pressure with pillow under 1 hip for couple hours and switching to the other to try to alleviate pressure on his backside.  Is been using Bactroban ointment with perceived benefit to patient.  Given long battle and several comorbidities with ulceration now appearing will refer to wound clinic for further management.  Patient agrees.  He may continue using Bactroban as needed

## 2020-10-30 NOTE — Progress Notes (Signed)
Acute Office Visit  Subjective:    Patient ID: [name], male    DOB: [birth year], 80 y.o.   MRN: [id number]  Chief Complaint  Patient presents with   Acute Visit    Lesion on hip that will not go away. Area is sore; no bleeding.     HPI Patient is in today for skin lesion left sie near the crack. States it has been there for approx 1.5 years. States he has been using Bactroban. States that it seems to help if he can get someone to help put . Negative for fever chills or wound drainage. Has had history of the same that required help from the wound center to heal. States that he is not able to lay down due to his back and he cannot have back surgery until his diabetes is better controlled    Past Medical History:  Diagnosis Date   Arthritis    arthirtis all over   Bell's palsy    Cataract 2020   bilateral eyes -pending surgery June 2020   Concussion    multiple- college football player   Diabetes mellitus type 2 in obese ([place])    Frequent urination at night    GERD (gastroesophageal reflux disease)    occ indigestion   Glaucoma    both eyes   Hyperlipemia    Hypertension    Kidney stones    PONV (postoperative nausea and vomiting)    Primary localized osteoarthritis of right hip    Primary osteoarthritis of right knee    Restless leg syndrome    Right knee DJD    Spinal stenosis of lumbar region with radiculopathy    s/p L4/L5 transforaminal blocks    Past Surgical History:  Procedure Laterality Date   ABDOMINAL HERNIA REPAIR     ANTERIOR HIP REVISION Right 01/08/2015   Procedure: RIGHT ANTERIOR HIP REVISION;  Surgeon: Renette Butters, MD;  Location: [facility];  Service: Orthopedics;  Laterality: Right;   BACK SURGERY     CANTHOPLASTY Right 02/09/2017   Procedure: RIGHT LATERAL CANTHOPLASTY;  Surgeon: Irene Limbo, MD;  Location: [facility];  Service: Plastics;  Laterality: Right;   CANTHOPLASTY Left 06/29/2017   Procedure: LEFT LOWER  CANTHOPLASTY;  Surgeon: Irene Limbo, MD;  Location: [facility];  Service: Plastics;  Laterality: Left;   CATARACT EXTRACTION Left    COLONOSCOPY     ELBOW ARTHROSCOPY  1990   right   ELBOW SURGERY     right   HERNIA REPAIR  1980   inguinal   HIP CLOSED REDUCTION Right 11/09/2014   Procedure: CLOSED REDUCTION HIP, s/p total hip 8/9;  Surgeon: Ninetta Lights, MD;  Location: [facility];  Service: Orthopedics;  Laterality: Right;   JOINT REPLACEMENT  2008   knee   LITHOTRIPSY     LUMBAR DISC SURGERY     2015   RECONSTRUCTION OF EYELID     REVISION TOTAL HIP ARTHROPLASTY Right 01/08/2015   SHOULDER ARTHROSCOPY  1980   right   SHOULDER SURGERY     right   STONE EXTRACTION WITH BASKET  2010   TOTAL HIP ARTHROPLASTY Right 11/06/2014   Procedure: RIGHT TOTAL HIP ARTHROPLASTY ANTERIOR APPROACH;  Surgeon: Renette Butters, MD;  Location: [facility];  Service: Orthopedics;  Laterality: Right;   TOTAL KNEE ARTHROPLASTY Left 07/2006   left knee   TOTAL KNEE ARTHROPLASTY Right 01/08/2014   Procedure: RIGHT TOTAL KNEE ARTHROPLASTY;  Surgeon: Herbie Baltimore  Venetia Maxon, MD;  Location: [facility];  Service: Orthopedics;  Laterality: Right;    Family History  Problem Relation Age of Onset   Hypertension Mother    Kidney disease Mother    Hypertension Father    Diabetes Mellitus II Father    Colon polyps Father    Diabetes Mellitus II Brother    Diabetes Mellitus II Brother    Prostate cancer Brother    Colon cancer Neg Hx     Social History   Socioeconomic History   Marital status: Married    Spouse name: [name]   Number of children: 2   Years of education: Not on file   Highest education level: Not on file  Occupational History   Occupation: retired  Tobacco Use   Smoking status: Never   Smokeless tobacco: Never  Substance and Sexual Activity   Alcohol use: Yes    Comment: glass of wine every 3-4 months   Drug use: No   Sexual activity: Never  Other Topics Concern   Not on file   Social History Narrative   Lives with wife   Right handed   Drinks no caffeine   Social Determinants of Health   Financial Resource Strain: Low Risk    Difficulty of Paying Living Expenses: Not very hard  Food Insecurity: Not on file  Transportation Needs: Not on file  Physical Activity: Not on file  Stress: Not on file  Social Connections: Not on file  Intimate Partner Violence: Not on file    Outpatient Medications Prior to Visit  Medication Sig Dispense Refill   allopurinol (ZYLOPRIM) 300 MG tablet Take 0.5 tablets (150 mg total) by mouth every morning. 45 tablet 3   amLODipine (NORVASC) 10 MG tablet TAKE 1 TABLET BY MOUTH DAILY 90 tablet 1   Ascorbic Acid (VITAMIN C WITH ROSE HIPS) 500 MG tablet Take 500 mg by mouth daily.     aspirin 81 MG tablet Take 81 mg by mouth daily.     atorvastatin (LIPITOR) 10 MG tablet TAKE 1 TABLET BY MOUTH EACH NIGHT AT BEDTIME 90 tablet 3   CINNAMON PO Take 200 mg by mouth daily.     Continuous Blood Gluc Receiver (FREESTYLE LIBRE 2 READER) DEVI USE TO CHECK BLOOD SUGAR DAILY 1 each 0   Continuous Blood Gluc Sensor (FREESTYLE LIBRE 2 SENSOR) MISC Use to check blood sugar daily. Dx E11.9 1 each 6   dapagliflozin propanediol (FARXIGA) 10 MG TABS tablet Take 1 tablet (10 mg total) by mouth daily before breakfast. 30 tablet 3   diclofenac sodium (VOLTAREN) 1 % GEL Uses PRN     dorzolamide-timolol (COSOPT) 22.3-6.8 MG/ML ophthalmic solution Place 1 drop into the left eye 2 (two) times daily.      Dulaglutide (TRULICITY) 3 0000000 SOPN Inject 3 mg as directed once a week. 8 mL 1   gemfibrozil (LOPID) 600 MG tablet Take 1 tablet (600 mg total) by mouth 2 (two) times daily. 180 tablet 3   insulin glargine, 2 Unit Dial, (TOUJEO MAX SOLOSTAR) 300 UNIT/ML Solostar Pen 175 units injected in the AM 60 mL 2   Insulin Pen Needle (B-D UF III MINI PEN NEEDLES) 31G X 5 MM MISC 1 each by Does not apply route daily. Use to administer insulin daily 100 each 3    latanoprost (XALATAN) 0.005 % ophthalmic solution Place 1 drop into both eyes at bedtime.     losartan (COZAAR) 100 MG tablet TAKE 1 TABLET BY MOUTH DAILY  90 tablet 2   meloxicam (MOBIC) 15 MG tablet Take 0.5-1 tablets (7.5-15 mg total) by mouth daily as needed for pain (with food). 30 tablet 5   methylcellulose (CITRUCEL) oral powder Take 1 packet by mouth daily.     metoprolol succinate (TOPROL-XL) 25 MG 24 hr tablet TAKE 1 TABLET BY MOUTH DAILY 90 tablet 1   Multiple Vitamins-Minerals (VITRUM 50+ SENIOR MULTI PO) Take 1 tablet by mouth daily.      mupirocin ointment (BACTROBAN) 2 % Apply 1 application topically 2 (two) times daily. 22 g 1   Tadalafil 2.5 MG TABS TAKE 1 TABLET BY MOUTH DAILY 30 tablet 5   triamcinolone cream (KENALOG) 0.1 % Apply 1 application topically 2 (two) times daily as needed. 30 g 0   Vibegron (GEMTESA) 75 MG TABS Take 75 mg by mouth at bedtime.     No facility-administered medications prior to visit.    Allergies  Allergen Reactions   Morphine And Related Nausea And Vomiting    Causes nausea & vomiting   Codeine Sulfate Other (See Comments)    intolerant   Metformin And Related Other (See Comments)    Gi intolerance, not an allergy    Review of Systems  Constitutional:  Negative for chills and fever.  Respiratory:  Negative for shortness of breath.   Cardiovascular:  Negative for chest pain.  Skin:  Positive for color change and wound.      Objective:    Physical Exam Constitutional:      Appearance: He is obese.  Cardiovascular:     Rate and Rhythm: Normal rate and regular rhythm.  Pulmonary:     Effort: Pulmonary effort is normal.     Breath sounds: Normal breath sounds.  Abdominal:     General: Bowel sounds are normal.  Skin:    General: Skin is warm.     Findings: Lesion present.          Comments: Patient has darkening of skin to bilateral buttock. Blanchable on palpation. Beginnings of ulcer ration on left buttock   Neurological:      Mental Status: He is alert.     BP 130/78 (BP Location: Left Arm, Patient Position: Sitting, Cuff Size: Normal)   Pulse 100   Temp 98.4 F (36.9 C) (Temporal)   Ht 6' (1.829 m)   Wt 261 lb (118.4 kg)   SpO2 95%   BMI 35.40 kg/m  Wt Readings from Last 3 Encounters:  10/30/20 261 lb (118.4 kg)  10/10/20 256 lb (116.1 kg)  07/11/20 256 lb (116.1 kg)    Health Maintenance Due  Topic Date Due   Hepatitis C Screening  Never done   Zoster Vaccines- Shingrix (1 of 2) Never done   COVID-19 Vaccine (4 - Booster for Pfizer series) 04/13/2020   INFLUENZA VACCINE  10/28/2020    There are no preventive care reminders to display for this patient.   Lab Results  Component Value Date   TSH 4.48 07/11/2020   Lab Results  Component Value Date   WBC 7.3 07/11/2020   HGB 15.4 07/11/2020   HCT 45.0 07/11/2020   MCV 91.0 07/11/2020   PLT 316.0 07/11/2020   Lab Results  Component Value Date   NA 129 (L) 10/10/2020   K 3.7 10/10/2020   CO2 21 10/10/2020   GLUCOSE 306 (H) 10/10/2020   BUN 29 (H) 10/10/2020   CREATININE 1.40 10/10/2020   BILITOT 0.6 07/17/2019   ALKPHOS 37 (L) 07/17/2019  AST 38 (H) 07/17/2019   ALT 33 07/17/2019   PROT 7.0 07/17/2019   ALBUMIN 3.9 07/17/2019   CALCIUM 10.6 (H) 10/10/2020   ANIONGAP 14 12/12/2019   GFR 47.77 (L) 10/10/2020   Lab Results  Component Value Date   CHOL 179 07/17/2019   Lab Results  Component Value Date   HDL 37.70 (L) 07/17/2019   Lab Results  Component Value Date   LDLCALC 82 05/09/2014   Lab Results  Component Value Date   TRIG (H) 07/17/2019    484.0 Triglyceride is over 400; calculations on Lipids are invalid.   Lab Results  Component Value Date   CHOLHDL 5 07/17/2019   Lab Results  Component Value Date   HGBA1C 10.0 (A) 10/10/2020       Assessment & Plan:   Problem List Items Addressed This Visit       Musculoskeletal and Integument   Pressure ulcer, buttock - Primary    Patient states that  dealing with this for 1 to 1-1/2 years.  States has been evaluated for in the past.  States he went to the wound clinic in the past also several years ago.  Recently evaluated in office on 10-10-2020.  Looking at last note had discoloration similar to what was in office but with no ulceration.  Does have beginnings of ulceration today in office.  Patient has several comorbidities diabetes being 1 along with back pain and problems.  Patient is unable to flat.  States he is most the time in a type of chair or recliner.  Did discuss alternating pressure with pillow under 1 hip for couple hours and switching to the other to try to alleviate pressure on his backside.  Is been using Bactroban ointment with perceived benefit to patient.  Given long battle and several comorbidities with ulceration now appearing will refer to wound clinic for further management.  Patient agrees.  He may continue using Bactroban as needed       Relevant Orders   AMB referral to wound care center     No orders of the defined types were placed in this encounter.  This visit occurred during the SARS-CoV-2 public health emergency.  Safety protocols were in place, including screening questions prior to the visit, additional usage of staff PPE, and extensive cleaning of exam room while observing appropriate contact time as indicated for disinfecting solutions.   Romilda Garret, NP

## 2020-10-30 NOTE — Patient Instructions (Signed)
Try to alternate a pillow under each hip every 2 hours Try and not put pressure on your buttock constantly Wound clinic will reach out to you Follow up as scheduled, sooner if needed

## 2020-10-31 DIAGNOSIS — N3941 Urge incontinence: Secondary | ICD-10-CM | POA: Diagnosis not present

## 2020-10-31 NOTE — Telephone Encounter (Signed)
error 

## 2020-11-19 ENCOUNTER — Telehealth: Payer: Self-pay

## 2020-11-19 NOTE — Chronic Care Management (AMB) (Addendum)
Chronic Care Management Pharmacy Assistant   Name: [name]  MRN: [id number] DOB: 1940/12/13  Reason for Encounter: Diabetes Review  Recent office visits:  10/30/20 - Family Medicine - Patient presented for left hip lesion. Continue to use Bactroban ointment. Referral to wound clinic. No medication changes. 10/10/20 - Dr Damita Dunnings, PCP - Patient presented for follow up diabetes. Labs ordered(creatinine slightly better). Getting his sugar under control with help with him having back surgery which would then help with his mobility. I would increase your insulin by 5 units every few days day, until your AM sugar is below 200. Recheck labs in 3 months.  Recent consult visits:  None since last CCM contact  Hospital visits:  None in previous 6 months  Medications: Outpatient Encounter Medications as of 11/19/2020  Medication Sig   allopurinol (ZYLOPRIM) 300 MG tablet Take 0.5 tablets (150 mg total) by mouth every morning.   amLODipine (NORVASC) 10 MG tablet TAKE 1 TABLET BY MOUTH DAILY   Ascorbic Acid (VITAMIN C WITH ROSE HIPS) 500 MG tablet Take 500 mg by mouth daily.   aspirin 81 MG tablet Take 81 mg by mouth daily.   atorvastatin (LIPITOR) 10 MG tablet TAKE 1 TABLET BY MOUTH EACH NIGHT AT BEDTIME   CINNAMON PO Take 200 mg by mouth daily.   Continuous Blood Gluc Receiver (FREESTYLE LIBRE 2 READER) DEVI USE TO CHECK BLOOD SUGAR DAILY   Continuous Blood Gluc Sensor (FREESTYLE LIBRE 2 SENSOR) MISC Use to check blood sugar daily. Dx E11.9   dapagliflozin propanediol (FARXIGA) 10 MG TABS tablet Take 1 tablet (10 mg total) by mouth daily before breakfast.   diclofenac sodium (VOLTAREN) 1 % GEL Uses PRN   dorzolamide-timolol (COSOPT) 22.3-6.8 MG/ML ophthalmic solution Place 1 drop into the left eye 2 (two) times daily.    Dulaglutide (TRULICITY) 3 0000000 SOPN Inject 3 mg as directed once a week.   gemfibrozil (LOPID) 600 MG tablet Take 1 tablet (600 mg total) by mouth 2 (two) times daily.    insulin glargine, 2 Unit Dial, (TOUJEO MAX SOLOSTAR) 300 UNIT/ML Solostar Pen 175 units injected in the AM   Insulin Pen Needle (B-D UF III MINI PEN NEEDLES) 31G X 5 MM MISC 1 each by Does not apply route daily. Use to administer insulin daily   latanoprost (XALATAN) 0.005 % ophthalmic solution Place 1 drop into both eyes at bedtime.   losartan (COZAAR) 100 MG tablet TAKE 1 TABLET BY MOUTH DAILY   meloxicam (MOBIC) 15 MG tablet Take 0.5-1 tablets (7.5-15 mg total) by mouth daily as needed for pain (with food).   methylcellulose (CITRUCEL) oral powder Take 1 packet by mouth daily.   metoprolol succinate (TOPROL-XL) 25 MG 24 hr tablet TAKE 1 TABLET BY MOUTH DAILY   Multiple Vitamins-Minerals (VITRUM 50+ SENIOR MULTI PO) Take 1 tablet by mouth daily.    mupirocin ointment (BACTROBAN) 2 % Apply 1 application topically 2 (two) times daily.   Tadalafil 2.5 MG TABS TAKE 1 TABLET BY MOUTH DAILY   triamcinolone cream (KENALOG) 0.1 % Apply 1 application topically 2 (two) times daily as needed.   Vibegron (GEMTESA) 75 MG TABS Take 75 mg by mouth at bedtime.   No facility-administered encounter medications on file as of 11/19/2020.    Recent Relevant Labs: Lab Results  Component Value Date/Time   HGBA1C 10.0 (A) 10/10/2020 09:24 AM   HGBA1C 10.5 (A) 07/11/2020 09:13 AM   HGBA1C 9.9 (H) 07/17/2019 10:28 AM   HGBA1C  9.6 (H) 07/15/2018 09:35 AM    Kidney Function Lab Results  Component Value Date/Time   CREATININE 1.40 10/10/2020 09:51 AM   CREATININE 1.52 (H) 07/11/2020 09:41 AM   CREATININE 1.44 12/21/2014 12:00 AM   CREATININE 1.32 05/09/2014 12:00 AM   GFR 47.77 (L) 10/10/2020 09:51 AM   GFRNONAA 40 (L) 12/12/2019 03:55 PM   GFRAA 47 (L) 12/12/2019 03:55 PM   Contacted patient on 11/21/20 to discuss diabetes disease state.   Current antihyperglycemic regimen:  Farxiga 10 mg - 1 tablet daily  Toujeo Max Solostar 300 unit/mL - 170 units every morning  Trulicity 3 mg - Inject once weekly  (Sunday)    Patient verbally confirms he is taking the above medications as directed. Yes  What diet changes have been made to improve diabetes control? The patient states he has cut out carbs and eating vegetables.  What recent interventions/DTPs have been made to improve glycemic control: Per PCP, increase insulin by 5 units until fasting BG < 200. Pt reports no matter what he has done he cannot get his sugars down. He has not adjusted insulin.  Have there been any recent hospitalizations or ED visits since last visit with CPP? No  Patient denies hypoglycemic symptoms, including Pale, Sweaty, Shaky, Hungry, Nervous/irritable, and Vision changes  Patient denies hyperglycemic symptoms, including blurry vision, excessive thirst, fatigue, polyuria, and weakness  How often are you checking your blood sugar? Currently infrequent  What are your blood sugars ranging? 280 - 350  - The patient reports his BG's stay high no matter what he does with medications and he tries to walk some for exercise because he still cannot have back surgery until he gets his BG lower.  During the week, how often does your blood glucose drop below 70? Never  Are you checking your feet daily/regularly? Yes  Adherence Review: Is the patient currently on a STATIN medication? Yes Is the patient currently on ACE/ARB medication? Yes Does the patient have >5 day gap between last estimated fill dates? No  Counseled patient on importance of annual eye and foot exam.   Star Rating Drugs:  Medication:  Last Fill: Day Supply Farxiga 10 mg  11/16/20 30 Toujeo max solostar [id number] 30 Trulicity 3 mg  Patient assistance Atorvastatin '10mg'$  10/21/20 90 Losartan '100mg'$  09/14/20 90  Upcoming appts: PCP appointment on 01/28/21  Debbora Dus, CPP notified  Avel Sensor, [place] Assistant [phone]  I have reviewed the care management and care coordination activities outlined in this encounter and I am  certifying that I agree with the content of this note. Would like to see him in person in September. Will have CMA contact for scheduling.  Debbora Dus, PharmD Clinical Pharmacist [facility] [phone]

## 2020-11-22 ENCOUNTER — Other Ambulatory Visit: Payer: Self-pay | Admitting: Family Medicine

## 2020-11-28 DIAGNOSIS — N3941 Urge incontinence: Secondary | ICD-10-CM | POA: Diagnosis not present

## 2020-12-03 ENCOUNTER — Telehealth: Payer: Self-pay | Admitting: Family Medicine

## 2020-12-03 DIAGNOSIS — E1169 Type 2 diabetes mellitus with other specified complication: Secondary | ICD-10-CM

## 2020-12-03 DIAGNOSIS — I779 Disorder of arteries and arterioles, unspecified: Secondary | ICD-10-CM

## 2020-12-03 NOTE — Telephone Encounter (Signed)
Please check with patient and see if he is willing to go for an endocrine consult since his sugar has been persistently elevated.  Let me know and I can put in the referral.    The other issue is that he is due for a follow-up CT of his chest to make sure his aorta has not enlarged more.  I went ahead and put in the order for that and he should get a call about scheduling.  Thanks.

## 2020-12-04 ENCOUNTER — Encounter: Payer: HMO | Admitting: Internal Medicine

## 2020-12-04 ENCOUNTER — Other Ambulatory Visit: Payer: Self-pay | Admitting: Family Medicine

## 2020-12-04 ENCOUNTER — Other Ambulatory Visit: Payer: Self-pay

## 2020-12-04 DIAGNOSIS — I779 Disorder of arteries and arterioles, unspecified: Secondary | ICD-10-CM

## 2020-12-04 NOTE — Telephone Encounter (Signed)
Patient advised and agreed to both. Please place referral to endocrinologist, does not matter location just who ever provider recommends.

## 2020-12-04 NOTE — Telephone Encounter (Signed)
Left message for patient to call back  

## 2020-12-04 NOTE — Telephone Encounter (Signed)
I put in the referral.  Thanks.  

## 2020-12-04 NOTE — Addendum Note (Signed)
Addended by: Tonia Ghent on: 12/04/2020 03:33 PM   Modules accepted: Orders

## 2020-12-06 ENCOUNTER — Other Ambulatory Visit: Payer: Self-pay | Admitting: Family Medicine

## 2020-12-06 DIAGNOSIS — I779 Disorder of arteries and arterioles, unspecified: Secondary | ICD-10-CM

## 2020-12-06 NOTE — Progress Notes (Signed)
Phone call with imaging site.  Needs order placed for bmet.  Order in emr now.  Imaging site to call pt about scheduling labs at [place] site.  I thank all involved.

## 2020-12-11 ENCOUNTER — Other Ambulatory Visit: Payer: Self-pay

## 2020-12-11 ENCOUNTER — Ambulatory Visit (INDEPENDENT_AMBULATORY_CARE_PROVIDER_SITE_OTHER): Payer: HMO | Admitting: Podiatry

## 2020-12-11 DIAGNOSIS — E1140 Type 2 diabetes mellitus with diabetic neuropathy, unspecified: Secondary | ICD-10-CM

## 2020-12-11 DIAGNOSIS — L84 Corns and callosities: Secondary | ICD-10-CM

## 2020-12-11 DIAGNOSIS — E1149 Type 2 diabetes mellitus with other diabetic neurological complication: Secondary | ICD-10-CM

## 2020-12-12 NOTE — Progress Notes (Signed)
Subjective:   Patient ID: [name], male   DOB: 80 y.o.   MRN: [id number]   HPI Patient presents stating that he has been concerned about the condition of the bottom of his left foot and is worried that he may have reoccurrence of lesion or other pathology and he cannot feel it due to his neuropathy   ROS      Objective:  Physical Exam  Neurovascular status is unchanged from previous visits with patient noted to have reactive tissues of the first metatarsal left but much better than previously with no breakdown of tissue no erythema edema surrounding the area     Assessment:  Patient who has relatively advanced diabetic neuropathy who walks hard on his foot but at this time it appears to be doing well with conservative care     Plan:  Reviewed again his diabetic neuropathy try daily inspections as best as possible and I have recommended cushioned shoes and not walking barefoot.  Patient will be seen back as needed but very pleased so far with how it looks

## 2020-12-13 ENCOUNTER — Other Ambulatory Visit: Payer: HMO

## 2020-12-13 ENCOUNTER — Other Ambulatory Visit (INDEPENDENT_AMBULATORY_CARE_PROVIDER_SITE_OTHER): Payer: HMO

## 2020-12-13 ENCOUNTER — Other Ambulatory Visit: Payer: Self-pay

## 2020-12-13 DIAGNOSIS — I779 Disorder of arteries and arterioles, unspecified: Secondary | ICD-10-CM

## 2020-12-13 LAB — BASIC METABOLIC PANEL
BUN: 29 mg/dL — ABNORMAL HIGH (ref 6–23)
CO2: 24 mEq/L (ref 19–32)
Calcium: 10.1 mg/dL (ref 8.4–10.5)
Chloride: 99 mEq/L (ref 96–112)
Creatinine, Ser: 1.37 mg/dL (ref 0.40–1.50)
GFR: 48.97 mL/min — ABNORMAL LOW (ref >60.00)
Glucose, Bld: 207 mg/dL — ABNORMAL HIGH (ref 70–99)
Potassium: 3.8 mEq/L (ref 3.5–5.1)
Sodium: 135 mEq/L (ref 135–145)

## 2020-12-16 ENCOUNTER — Other Ambulatory Visit: Payer: Self-pay

## 2020-12-16 ENCOUNTER — Ambulatory Visit (INDEPENDENT_AMBULATORY_CARE_PROVIDER_SITE_OTHER)
Admission: RE | Admit: 2020-12-16 | Discharge: 2020-12-16 | Disposition: A | Discharge status: Home / Self Care | Payer: HMO | Source: Ambulatory Visit | Attending: Family Medicine | Admitting: Family Medicine

## 2020-12-16 DIAGNOSIS — I2510 Atherosclerotic heart disease of native coronary artery without angina pectoris: Secondary | ICD-10-CM | POA: Diagnosis not present

## 2020-12-16 DIAGNOSIS — I712 Thoracic aortic aneurysm, without rupture: Secondary | ICD-10-CM | POA: Diagnosis not present

## 2020-12-16 DIAGNOSIS — J9811 Atelectasis: Secondary | ICD-10-CM | POA: Diagnosis not present

## 2020-12-16 DIAGNOSIS — I779 Disorder of arteries and arterioles, unspecified: Secondary | ICD-10-CM

## 2020-12-16 DIAGNOSIS — J929 Pleural plaque without asbestos: Secondary | ICD-10-CM | POA: Diagnosis not present

## 2020-12-16 MED ORDER — IOHEXOL 350 MG/ML SOLN
100.0000 mL | Freq: Once | INTRAVENOUS | Status: AC | PRN
  Administered 2020-12-16: 100 mL via INTRAVENOUS

## 2020-12-20 ENCOUNTER — Other Ambulatory Visit: Payer: Self-pay

## 2020-12-20 ENCOUNTER — Encounter: Payer: HMO | Attending: Physician Assistant | Admitting: Physician Assistant

## 2020-12-20 DIAGNOSIS — L89323 Pressure ulcer of left buttock, stage 3: Secondary | ICD-10-CM | POA: Insufficient documentation

## 2020-12-20 DIAGNOSIS — L89322 Pressure ulcer of left buttock, stage 2: Secondary | ICD-10-CM | POA: Insufficient documentation

## 2020-12-20 DIAGNOSIS — Z833 Family history of diabetes mellitus: Secondary | ICD-10-CM | POA: Insufficient documentation

## 2020-12-20 DIAGNOSIS — E11622 Type 2 diabetes mellitus with other skin ulcer: Secondary | ICD-10-CM | POA: Diagnosis not present

## 2020-12-20 DIAGNOSIS — Z885 Allergy status to narcotic agent status: Secondary | ICD-10-CM | POA: Insufficient documentation

## 2020-12-20 DIAGNOSIS — Z8249 Family history of ischemic heart disease and other diseases of the circulatory system: Secondary | ICD-10-CM | POA: Insufficient documentation

## 2020-12-20 NOTE — Progress Notes (Signed)
MAVRIC, CORTRIGHT ([id number]) Visit Report for 12/20/2020 Allergy List Details Patient Name: [name], [name] Date of Service: 12/20/2020 12:45 PM Medical Record Number: [id number] Patient Account Number: [id number] Date of Birth/Sex: 09/03/1940 (79 y.o. M) Treating RN: Dolan Amen Primary Care Manasi Dishon: Elsie Stain Other Clinician: Referring Kel Senn: Elsie Stain Treating Breyer Tejera/Extender: Jeri Cos Weeks in Treatment: 0 Allergies Active Allergies morphine codeine Allergy Notes Electronic Signature(s) Signed: 12/20/2020 4:36:36 PM By: Dolan Amen RN Entered By: Dolan Amen on 12/20/2020 13:09:53 [name] ([id number]) -------------------------------------------------------------------------------- Arrival Information Details Patient Name: [name] Date of Service: 12/20/2020 12:45 PM Medical Record Number: [id number] Patient Account Number: [id number] Date of Birth/Sex: [birth year] (79 y.o. M) Treating RN: Dolan Amen Primary Care Emiel Kielty: Elsie Stain Other Clinician: Referring Turrell Severt: Elsie Stain Treating Shalanda Brogden/Extender: Skipper Cliche in Treatment: 0 Visit Information Patient Arrived: Lyndel Pleasure Time: 13:07 Accompanied By: wife Transfer Assistance: None Patient Identification Verified: Yes Secondary Verification Process Completed: Yes History Since Last Visit Electronic Signature(s) Signed: 12/20/2020 4:36:36 PM By: Dolan Amen RN Entered By: Dolan Amen on 12/20/2020 13:09:20 [name] ([id number]) -------------------------------------------------------------------------------- Clinic Level of Care Assessment Details Patient Name: [name] Date of Service: 12/20/2020 12:45 PM Medical Record Number: [id number] Patient Account Number: [id number] Date of Birth/Sex: Nov 16, 1940 (79 y.o. M) Treating RN: Dolan Amen Primary Care Callan Norden: Elsie Stain Other Clinician: Referring Jana Swartzlander: Elsie Stain Treating Charlotta Lapaglia/Extender: Skipper Cliche in Treatment: 0 Clinic Level of Care Assessment Items TOOL 2 Quantity Score X - Use when only an EandM is performed on the INITIAL visit 1 0 ASSESSMENTS - Nursing Assessment / Reassessment X - General Physical Exam (combine w/ comprehensive assessment (listed just below) when performed on new 1 20 pt. evals) X- 1 25 Comprehensive Assessment (HX, ROS, Risk Assessments, Wounds Hx, etc.) ASSESSMENTS - Wound and Skin Assessment / Reassessment X - Simple Wound Assessment / Reassessment - one wound 1 5 '[]'  - 0 Complex Wound Assessment / Reassessment - multiple wounds '[]'  - 0 Dermatologic / Skin Assessment (not related to wound area) ASSESSMENTS - Ostomy and/or Continence Assessment and Care '[]'  - Incontinence Assessment and Management 0 '[]'  - 0 Ostomy Care Assessment and Management (repouching, etc.) PROCESS - Coordination of Care X - Simple Patient / Family Education for ongoing care 1 15 '[]'  - 0 Complex (extensive) Patient / Family Education for ongoing care X- 1 10 Staff obtains Programmer, systems, Records, Test Results / Process Orders '[]'  - 0 Staff telephones HHA, Nursing Homes / Clarify orders / etc '[]'  - 0 Routine Transfer to another Facility (non-emergent condition) '[]'  - 0 Routine Hospital Admission (non-emergent condition) X- 1 15 New Admissions / Biomedical engineer / Ordering NPWT, Apligraf, etc. '[]'  - 0 Emergency Hospital Admission (emergent condition) X- 1 10 Simple Discharge Coordination '[]'  - 0 Complex (extensive) Discharge Coordination PROCESS - Special Needs '[]'  - Pediatric / Minor Patient Management 0 '[]'  - 0 Isolation Patient Management '[]'  - 0 Hearing / Language / Visual special needs '[]'  - 0 Assessment of Community assistance (transportation, D/C planning, etc.) '[]'  - 0 Additional assistance / Altered mentation '[]'  - 0 Support Surface(s) Assessment (bed, cushion, seat, etc.) INTERVENTIONS - Wound Cleansing /  Measurement X - Wound Imaging (photographs - any number of wounds) 1 5 '[]'  - 0 Wound Tracing (instead of photographs) X- 1 5 Simple Wound Measurement - one wound '[]'  - 0 Complex Wound Measurement - multiple wounds [name], Ronnie N. ([id number]) X- 1 5 Simple Wound Cleansing -  one wound '[]'  - 0 Complex Wound Cleansing - multiple wounds INTERVENTIONS - Wound Dressings '[]'  - Small Wound Dressing one or multiple wounds 0 X- 1 15 Medium Wound Dressing one or multiple wounds '[]'  - 0 Large Wound Dressing one or multiple wounds '[]'  - 0 Application of Medications - injection INTERVENTIONS - Miscellaneous '[]'  - External ear exam 0 '[]'  - 0 Specimen Collection (cultures, biopsies, blood, body fluids, etc.) '[]'  - 0 Specimen(s) / Culture(s) sent or taken to Lab for analysis '[]'  - 0 Patient Transfer (multiple staff / Civil Service fast streamer / Similar devices) '[]'  - 0 Simple Staple / Suture removal (25 or less) '[]'  - 0 Complex Staple / Suture removal (26 or more) '[]'  - 0 Hypo / Hyperglycemic Management (close monitor of Blood Glucose) '[]'  - 0 Ankle / Brachial Index (ABI) - do not check if billed separately Has the patient been seen at the hospital within the last three years: Yes Total Score: 130 Level Of Care: New/Established - Level 4 Electronic Signature(s) Signed: 12/20/2020 4:36:36 PM By: Dolan Amen RN Entered By: Dolan Amen on 12/20/2020 14:03:52 [name] ([id number]) -------------------------------------------------------------------------------- Encounter Discharge Information Details Patient Name: [name] Date of Service: 12/20/2020 12:45 PM Medical Record Number: [id number] Patient Account Number: [id number] Date of Birth/Sex: 1940/08/30 (79 y.o. M) Treating RN: Dolan Amen Primary Care Lori Popowski: Elsie Stain Other Clinician: Referring Rayn Enderson: Elsie Stain Treating Emaleigh Guimond/Extender: Skipper Cliche in Treatment: 0 Encounter Discharge Information Items Post  Procedure Vitals Discharge Condition: Stable Temperature (F): 98.0 Ambulatory Status: Cane Pulse (bpm): 96 Discharge Destination: Home Respiratory Rate (breaths/min): 18 Transportation: Private Auto Blood Pressure (mmHg): 147/86 Accompanied By: wife Schedule Follow-up Appointment: Yes Clinical Summary of Care: Electronic Signature(s) Signed: 12/20/2020 2:05:13 PM By: Dolan Amen RN Entered By: Dolan Amen on 12/20/2020 14:05:13 [name] ([id number]) -------------------------------------------------------------------------------- Lower Extremity Assessment Details Patient Name: [name] Date of Service: 12/20/2020 12:45 PM Medical Record Number: [id number] Patient Account Number: [id number] Date of Birth/Sex: 1940/10/03 (79 y.o. M) Treating RN: Dolan Amen Primary Care Sharmayne Jablon: Elsie Stain Other Clinician: Referring Althea Backs: Elsie Stain Treating Xavi Tomasik/Extender: Jeri Cos Weeks in Treatment: 0 Electronic Signature(s) Signed: 12/20/2020 4:36:36 PM By: Dolan Amen RN Entered By: Dolan Amen on 12/20/2020 13:24:10 [name] ([id number]) -------------------------------------------------------------------------------- Multi Wound Chart Details Patient Name: [name] Date of Service: 12/20/2020 12:45 PM Medical Record Number: [id number] Patient Account Number: [id number] Date of Birth/Sex: 11/20/1940 (79 y.o. M) Treating RN: Dolan Amen Primary Care Tobey Lippard: Elsie Stain Other Clinician: Referring Hildegarde Dunaway: Elsie Stain Treating Drue Camera/Extender: Jeri Cos Weeks in Treatment: 0 Vital Signs Height(in): 72 Pulse(bpm): 16 Weight(lbs): 36 Blood Pressure(mmHg): 147/86 Body Mass Index(BMI): 34 Temperature(F): 98.0 Respiratory Rate(breaths/min): 18 Photos: [N/A:N/A] Wound Location: Left Gluteus N/A N/A Wounding Event: Pressure Injury N/A N/A Primary Etiology: Pressure Ulcer N/A N/A Comorbid History:  Glaucoma, Hypertension, Type II N/A N/A Diabetes, History of pressure wounds, Gout, Osteoarthritis Date Acquired: 12/21/2019 N/A N/A Weeks of Treatment: 0 N/A N/A Wound Status: Open N/A N/A Measurements L x W x D (cm) 0.5x0.3x0.1 N/A N/A Area (cm) : 0.118 N/A N/A Volume (cm) : 0.012 N/A N/A % Reduction in Area: 0.00% N/A N/A % Reduction in Volume: 0.00% N/A N/A Classification: Category/Stage II N/A N/A Exudate Amount: Medium N/A N/A Exudate Type: Serous N/A N/A Exudate Color: amber N/A N/A Granulation Amount: None Present (0%) N/A N/A Necrotic Amount: None Present (0%) N/A N/A Exposed Structures: Fat Layer (Subcutaneous Tissue): N/A N/A Yes Fascia:  No Tendon: No Muscle: No Joint: No Bone: No Epithelialization: None N/A N/A Treatment Notes Electronic Signature(s) Signed: 12/20/2020 4:36:36 PM By: Dolan Amen RN Entered By: Dolan Amen on 12/20/2020 13:46:02 [name] ([id number]) -------------------------------------------------------------------------------- [place] Details Patient Name: [name] Date of Service: 12/20/2020 12:45 PM Medical Record Number: [id number] Patient Account Number: [id number] Date of Birth/Sex: [birth year] (79 y.o. M) Treating RN: Dolan Amen Primary Care Lindsie Simar: Elsie Stain Other Clinician: Referring Makenzye Troutman: Elsie Stain Treating Marlisa Caridi/Extender: Skipper Cliche in Treatment: 0 Active Inactive Orientation to the Wound Care Program Nursing Diagnoses: Knowledge deficit related to the wound healing center program Goals: Patient/caregiver will verbalize understanding of the [place] Date Initiated: 12/20/2020 Target Resolution Date: 12/20/2020 Goal Status: Active Interventions: Provide education on orientation to the wound center Notes: Pressure Nursing Diagnoses: Knowledge deficit related to causes and risk factors for pressure ulcer development Goals: Patient  will remain free from development of additional pressure ulcers Date Initiated: 12/20/2020 Target Resolution Date: 01/19/2021 Goal Status: Active Patient will remain free of pressure ulcers Date Initiated: 12/20/2020 Target Resolution Date: 01/19/2021 Goal Status: Active Patient/caregiver will verbalize risk factors for pressure ulcer development Date Initiated: 12/20/2020 Target Resolution Date: 12/20/2020 Goal Status: Active Patient/caregiver will verbalize understanding of pressure ulcer management Date Initiated: 12/20/2020 Target Resolution Date: 12/20/2020 Goal Status: Active Interventions: Assess: immobility, friction, shearing, incontinence upon admission and as needed Assess offloading mechanisms upon admission and as needed Assess potential for pressure ulcer upon admission and as needed Provide education on pressure ulcers Notes: Wound/Skin Impairment Nursing Diagnoses: Impaired tissue integrity Goals: Patient/caregiver will verbalize understanding of skin care regimen Date Initiated: 12/20/2020 Target Resolution Date: 12/20/2020 Goal Status: Active Ulcer/skin breakdown will have a volume reduction of 30% by week 4 Date Initiated: 12/20/2020 Target Resolution Date: 01/19/2021 Goal Status: Active [name], [name] ([id number]) Ulcer/skin breakdown will have a volume reduction of 50% by week 8 Date Initiated: 12/20/2020 Target Resolution Date: 02/19/2021 Goal Status: Active Ulcer/skin breakdown will have a volume reduction of 80% by week 12 Date Initiated: 12/20/2020 Target Resolution Date: 03/21/2021 Goal Status: Active Ulcer/skin breakdown will heal within 14 weeks Date Initiated: 12/20/2020 Target Resolution Date: 04/21/2021 Goal Status: Active Interventions: Assess patient/caregiver ability to obtain necessary supplies Assess patient/caregiver ability to perform ulcer/skin care regimen upon admission and as needed Assess ulceration(s) every visit Provide education on  ulcer and skin care Treatment Activities: Referred to DME Bona Hubbard for dressing supplies : 12/20/2020 Skin care regimen initiated : 12/20/2020 Notes: Electronic Signature(s) Signed: 12/20/2020 4:36:36 PM By: Dolan Amen RN Entered By: Dolan Amen on 12/20/2020 13:45:51 [name] ([id number]) -------------------------------------------------------------------------------- Pain Assessment Details Patient Name: [name] Date of Service: 12/20/2020 12:45 PM Medical Record Number: [id number] Patient Account Number: [id number] Date of Birth/Sex: 07-02-1940 (79 y.o. M) Treating RN: Dolan Amen Primary Care Hephzibah Strehle: Elsie Stain Other Clinician: Referring Coralynn Gaona: Elsie Stain Treating Juni Glaab/Extender: Skipper Cliche in Treatment: 0 Active Problems Location of Pain Severity and Description of Pain Patient Has Paino No Site Locations Rate the pain. Current Pain Level: 0 Pain Management and Medication Current Pain Management: Electronic Signature(s) Signed: 12/20/2020 4:36:36 PM By: Dolan Amen RN Entered By: Dolan Amen on 12/20/2020 13:09:26 [name] ([id number]) -------------------------------------------------------------------------------- Patient/Caregiver Education Details Patient Name: [name] Date of Service: 12/20/2020 12:45 PM Medical Record Number: [id number] Patient Account Number: [id number] Date of Birth/Gender: May 13, 1940 (79 y.o. M) Treating RN: Dolan Amen Primary Care Physician: Elsie Stain  Other Clinician: Referring Physician: Elsie Stain Treating Physician/Extender: Skipper Cliche in Treatment: 0 Education Assessment Education Provided To: Patient Education Topics Provided Pressure: Methods: Explain/Verbal Responses: State content correctly Welcome To [facility]: Methods: Explain/Verbal Responses: State content correctly Wound/Skin Impairment: Methods:  Explain/Verbal Responses: State content correctly Electronic Signature(s) Signed: 12/20/2020 4:36:36 PM By: Dolan Amen RN Entered By: Dolan Amen on 12/20/2020 14:04:25 [name] ([id number]) -------------------------------------------------------------------------------- Wound Assessment Details Patient Name: [name] Date of Service: 12/20/2020 12:45 PM Medical Record Number: [id number] Patient Account Number: [id number] Date of Birth/Sex: 05/08/1940 (79 y.o. M) Treating RN: Dolan Amen Primary Care Alf Doyle: Elsie Stain Other Clinician: Referring Siana Panameno: Elsie Stain Treating Payge Eppes/Extender: Jeri Cos Weeks in Treatment: 0 Wound Status Wound Number: 2 Primary Pressure Ulcer Etiology: Wound Location: Left Gluteus Wound Open Wounding Event: Pressure Injury Status: Date Acquired: 12/21/2019 Comorbid Glaucoma, Hypertension, Type II Diabetes, History of Weeks Of Treatment: 0 History: pressure wounds, Gout, Osteoarthritis Clustered Wound: No Photos Wound Measurements Length: (cm) 1 Width: (cm) 1 Depth: (cm) 0.1 Area: (cm) 0.785 Volume: (cm) 0.079 % Reduction in Area: -565.3% % Reduction in Volume: -558.3% Epithelialization: None Tunneling: No Undermining: No Wound Description Classification: Category/Stage III Exudate Amount: Medium Exudate Type: Serous Exudate Color: amber Foul Odor After Cleansing: No Slough/Fibrino No Wound Bed Granulation Amount: Large (67-100%) Exposed Structure Granulation Quality: Pink Fascia Exposed: No Necrotic Amount: None Present (0%) Fat Layer (Subcutaneous Tissue) Exposed: Yes Tendon Exposed: No Muscle Exposed: No Joint Exposed: No Bone Exposed: No Treatment Notes Wound #2 (Gluteus) Wound Laterality: Left Cleanser [place] Ancillary Kit - 15 Day Supply Discharge Instruction: Use supplies as instructed; Kit contains: (15) Saline Bullets; (15) 3x3 Gauze; 15 pr Gloves Wound Cleanser Discharge  Instruction: Wash your hands with soap and water. Remove old dressing, discard into plastic bag and place into trash. Cleanse the wound with Wound Cleanser prior to applying a clean dressing using gauze sponges, not tissues or cotton balls. Do not Perryman, Kollin N. ([id number]) scrub or use excessive force. Pat dry using gauze sponges, not tissue or cotton balls. Peri-Wound Care Topical Primary Dressing Silvercel Small 2x2 (in/in) Discharge Instruction: Apply Silvercel Small 2x2 (in/in) as instructed Zetuvit Plus Silicone Border Dressing 4x4 (in/in) Discharge Instruction: Secure dressing Secondary Dressing Secured With Compression Wrap Compression Stockings Add-Ons Electronic Signature(s) Signed: 12/20/2020 4:36:36 PM By: Dolan Amen RN Entered By: Dolan Amen on 12/20/2020 13:57:48 [name] ([id number]) -------------------------------------------------------------------------------- Vitals Details Patient Name: [name] Date of Service: 12/20/2020 12:45 PM Medical Record Number: [id number] Patient Account Number: [id number] Date of Birth/Sex: Oct 18, 1940 (79 y.o. M) Treating RN: Dolan Amen Primary Care Cristella Stiver: Elsie Stain Other Clinician: Referring Callum Wolf: Elsie Stain Treating Amesha Bailey/Extender: Skipper Cliche in Treatment: 0 Vital Signs Time Taken: 13:08 Temperature (F): 98.0 Height (in): 72 Pulse (bpm): 96 Source: Stated Respiratory Rate (breaths/min): 18 Weight (lbs): 254 Blood Pressure (mmHg): 147/86 Source: Measured Reference Range: 80 - 120 mg / dl Body Mass Index (BMI): 34.4 Electronic Signature(s) Signed: 12/20/2020 4:36:36 PM By: Dolan Amen RN Entered By: Dolan Amen on 12/20/2020 13:09:06

## 2020-12-20 NOTE — Progress Notes (Signed)
ASMAR, BROZEK ([id number]) Visit Report for 12/20/2020 Chief Complaint Document Details Patient Name: [name] Date of Service: 12/20/2020 12:45 PM Medical Record Number: [id number] Patient Account Number: [id number] Date of Birth/Sex: 1940-12-20 (80 y.o. M) Treating RN: Dolan Amen Primary Care Provider: Elsie Stain Other Clinician: Referring Provider: Elsie Stain Treating Provider/Extender: Jeri Cos Weeks in Treatment: 0 Information Obtained from: Patient Chief Complaint Left gluteal pressure ulcer Electronic Signature(s) Signed: 12/20/2020 1:42:23 PM By: Worthy Keeler PA-C Entered By: Worthy Keeler on 12/20/2020 13:42:23 [name] ([id number]) -------------------------------------------------------------------------------- Debridement Details Patient Name: [name] Date of Service: 12/20/2020 12:45 PM Medical Record Number: [id number] Patient Account Number: [id number] Date of Birth/Sex: 12/29/1940 (80 y.o. M) Treating RN: Dolan Amen Primary Care Provider: Elsie Stain Other Clinician: Referring Provider: Elsie Stain Treating Provider/Extender: Jeri Cos Weeks in Treatment: 0 Debridement Performed for Wound #2 Left Gluteus Assessment: Performed By: Physician Tommie Sams., PA-C Debridement Type: Chemical/Enzymatic/Mechanical Agent Used: saline gauze Level of Consciousness (Pre- Awake and Alert procedure): Pre-procedure Verification/Time Out Yes - 15:50 Taken: Start Time: 15:50 Instrument: Other : saline and gauze Bleeding: None Response to Treatment: Procedure was tolerated well Level of Consciousness (Post- Awake and Alert procedure): Post Debridement Measurements of Total Wound Length: (cm) 0.5 Stage: Category/Stage II Width: (cm) 0.3 Depth: (cm) 0.1 Volume: (cm) 0.012 Character of Wound/Ulcer Post Debridement: Stable Post Procedure Diagnosis Same as Pre-procedure Electronic Signature(s) Signed:  12/20/2020 4:36:36 PM By: Dolan Amen RN Signed: 12/20/2020 4:54:51 PM By: Worthy Keeler PA-C Entered By: Dolan Amen on 12/20/2020 13:50:36 [name] ([id number]) -------------------------------------------------------------------------------- HPI Details Patient Name: [name] Date of Service: 12/20/2020 12:45 PM Medical Record Number: [id number] Patient Account Number: [id number] Date of Birth/Sex: March 04, 1941 (80 y.o. M) Treating RN: Dolan Amen Primary Care Provider: Elsie Stain Other Clinician: Referring Provider: Elsie Stain Treating Provider/Extender: Skipper Cliche in Treatment: 0 History of Present Illness HPI Description: 05/30/2019 upon evaluation today patient appears for initial evaluation in our clinic concerning issues that he has been having on the left gluteal region due to a pressure ulcer unfortunately. He does have a lot of weakness as well as issues with hip pain that causes him to not be able to sleep in a bed that has been quite a problem for him unfortunately. 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 Nonetheless this has led to chronic pain in the right hip. The patient is slow-moving but is able to ambulate today. He does typically sleep in his lift chair. He does not really move around as much as he probably should although he can and I think this is one area where I am going to suggest that we go ahead and have him get up and move around more frequently to try to help with offloading I discussed how this can be beneficial. The good news is his wounds do not seem to be too deep. No fever chills noted 06/05/2019 upon evaluation today patient's wound is showing some signs of slight improvement which is good news. With that being said there is unfortunately no evidence of significant  overall improvement which is the one issue that we are seeing at this time with regard to how much she has been sitting. His family member tells me that he sat pretty much all day yesterday the patient states he  got up once to go outside but again for the most part he does not seem to be moving around much at all. 06/19/2019 upon evaluation today patient appears to be doing excellent in regard to his wound. In fact this has made great progress despite the fact that he apparently had food poisoning with diarrhea over the weekend when he was at the beach. This is indeed unfortunate and to be honest the only good thing about this is that I did discontinue doing the dressing and actually switched over to just using gentamicin cream applied to the wound bed followed by a good coating of Desitin which seems to have done excellent for him. There is no signs of active infection at this time. No fevers, chills, nausea, vomiting, or diarrhea. Readmission: 12/20/2020 upon evaluation today patient appears to be doing somewhat poorly in regard to the left gluteal region this is actually the same area that I previously took care of as well when he was here back in March 2021. Unfortunately since that time he is continue to have issues with intermittent opening and honestly it really has not seen just go away completely. Fortunately there does not appear to be any signs of active infection at this time which is great news. He has been using gentamicin cream they did use some collagen previously but its been a little while since they did that. That was left over from the previous time I saw the patient. With that being said he does have a chair at the sleep center he has spinal stenosis so is not able to sleep in the bed and lay flat or even on his side for that matter. That is one of the big complicating factors here. The patient again still does have issues with hypertension, diabetes mellitus type 2, spinal  stenosis, and generalized muscle weakness. Electronic Signature(s) Signed: 12/20/2020 2:06:07 PM By: Worthy Keeler PA-C Entered By: Worthy Keeler on 12/20/2020 14:06:07 [name] ([id number]) -------------------------------------------------------------------------------- Physical Exam Details Patient Name: [name] Date of Service: 12/20/2020 12:45 PM Medical Record Number: [id number] Patient Account Number: [id number] Date of Birth/Sex: 27-Aug-1940 (80 y.o. M) Treating RN: Dolan Amen Primary Care Provider: Elsie Stain Other Clinician: Referring Provider: Elsie Stain Treating Provider/Extender: Jeri Cos Weeks in Treatment: 0 Constitutional sitting or standing blood pressure is within target range for patient.. pulse regular and within target range for patient.[name] respirations regular, non- labored and within target range for patient.[name] temperature within target range for patient.. Well-nourished and well-hydrated in no acute distress. Eyes conjunctiva clear no eyelid edema noted. pupils equal round and reactive to light and accommodation. Ears, Nose, Mouth, and Throat no gross abnormality of ear auricles or external auditory canals. normal hearing noted during conversation. mucus membranes moist. Respiratory normal breathing without difficulty. Cardiovascular 2+ dorsalis pedis/posterior tibialis pulses. no clubbing, cyanosis, significant edema, <3 sec cap refill. Musculoskeletal stooped posture. no significant deformity or arthritic changes, no loss or range of motion, no clubbing. Psychiatric this patient is able to make decisions and demonstrates good insight into disease process. Alert and Oriented x 3. pleasant and cooperative. Notes Upon inspection patient's wound bed actually showed signs of having some fat layer exposed based on what I am seeing currently. Fortunately there does not appear to be any evidence of active infection at this time which is  great news. With that being said this means that this is a stage III although very minor stage III pressure ulcer. Fortunately I do not  see any evidence that there is infection currently but nonetheless he does have some discomfort especially when he sitting long periods of time is also some evidence of mild bruising/deep tissue injury. Electronic Signature(s) Signed: 12/20/2020 2:07:03 PM By: Worthy Keeler PA-C Entered By: Worthy Keeler on 12/20/2020 14:07:03 [name] ([id number]) -------------------------------------------------------------------------------- Physician Orders Details Patient Name: [name] Date of Service: 12/20/2020 12:45 PM Medical Record Number: [id number] Patient Account Number: [id number] Date of Birth/Sex: 05-22-1940 (80 y.o. M) Treating RN: Dolan Amen Primary Care Provider: Elsie Stain Other Clinician: Referring Provider: Elsie Stain Treating Provider/Extender: Skipper Cliche in Treatment: 0 Verbal / Phone Orders: No Diagnosis Coding ICD-10 Coding Code Description [phone] Pressure ulcer of left buttock, stage 2 I10 Essential (primary) hypertension E11.622 Type 2 diabetes mellitus with other skin ulcer M62.81 Muscle weakness (generalized) M48.07 Spinal stenosis, lumbosacral region Follow-up Appointments o Return Appointment in 2 weeks. Bathing/ Shower/ Hygiene o May shower; gently cleanse wound with antibacterial soap, rinse and pat dry prior to dressing wounds Off-Loading o Turn and reposition every 2 hours Wound Treatment Wound #2 - Gluteus Wound Laterality: Left Cleanser: Byram Ancillary Kit - 15 Day Supply (DME) (Generic) 1 x Per Day/30 Days Discharge Instructions: Use supplies as instructed; Kit contains: (15) Saline Bullets; (15) 3x3 Gauze; 15 pr Gloves Cleanser: Wound Cleanser (Generic) 1 x Per Day/30 Days Discharge Instructions: Wash your hands with soap and water. Remove old dressing, discard into plastic bag  and place into trash. Cleanse the wound with Wound Cleanser prior to applying a clean dressing using gauze sponges, not tissues or cotton balls. Do not scrub or use excessive force. Pat dry using gauze sponges, not tissue or cotton balls. Primary Dressing: Silvercel Small 2x2 (in/in) (DME) (Generic) 1 x Per Day/30 Days Discharge Instructions: Apply Silvercel Small 2x2 (in/in) as instructed Primary Dressing: Zetuvit Plus Silicone Border Dressing 4x4 (in/in) (DME) (Generic) 1 x Per Day/30 Days Discharge Instructions: Secure dressing Electronic Signature(s) Signed: 12/20/2020 4:36:36 PM By: Dolan Amen RN Signed: 12/20/2020 4:54:51 PM By: Worthy Keeler PA-C Entered By: Dolan Amen on 12/20/2020 13:58:33 [name] ([id number]) -------------------------------------------------------------------------------- Problem List Details Patient Name: [name] Date of Service: 12/20/2020 12:45 PM Medical Record Number: [id number] Patient Account Number: [id number] Date of Birth/Sex: [birth year] (80 y.o. M) Treating RN: Dolan Amen Primary Care Provider: Elsie Stain Other Clinician: Referring Provider: Elsie Stain Treating Provider/Extender: Jeri Cos Weeks in Treatment: 0 Active Problems ICD-10 Encounter Code Description Active Date MDM Diagnosis [phone] Pressure ulcer of left buttock, stage 3 12/20/2020 No Yes I10 Essential (primary) hypertension 12/20/2020 No Yes E11.622 Type 2 diabetes mellitus with other skin ulcer 12/20/2020 No Yes M62.81 Muscle weakness (generalized) 12/20/2020 No Yes M48.07 Spinal stenosis, lumbosacral region 12/20/2020 No Yes Inactive Problems Resolved Problems Electronic Signature(s) Signed: 12/20/2020 4:36:36 PM By: Dolan Amen RN Signed: 12/20/2020 4:54:51 PM By: Worthy Keeler PA-C Previous Signature: 12/20/2020 1:40:24 PM Version By: Worthy Keeler PA-C Entered By: Dolan Amen on 12/20/2020 13:54:45 [name]  ([id number]) -------------------------------------------------------------------------------- Progress Note Details Patient Name: [name] Date of Service: 12/20/2020 12:45 PM Medical Record Number: [id number] Patient Account Number: [id number] Date of Birth/Sex: 03-03-1941 (80 y.o. M) Treating RN: Dolan Amen Primary Care Provider: Elsie Stain Other Clinician: Referring Provider: Elsie Stain Treating Provider/Extender: Skipper Cliche in Treatment: 0 Subjective Chief Complaint Information obtained from Patient Left gluteal pressure ulcer History of Present Illness (HPI) 05/30/2019 upon evaluation today patient appears for initial  evaluation in our clinic concerning issues that he has been having on the left gluteal region due to a pressure ulcer unfortunately. He does have a lot of weakness as well as issues with hip pain that causes him to not be able to sleep in a bed that has been quite a problem for him unfortunately. 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 Nonetheless this has led to chronic pain in the right hip. The patient is slow-moving but is able to ambulate today. He does typically sleep in his lift chair. He does not really move around as much as he probably should although he can and I think this is one area where I am going to suggest that we go ahead and have him get up and move around more frequently to try to help with offloading I discussed how this can be beneficial. The good news is his wounds do not seem to be too deep. No fever chills noted 06/05/2019 upon evaluation today patient's wound is showing some signs of slight improvement which is good news. With that being said there is unfortunately no evidence of significant overall improvement which is the one issue that we are seeing at this time with  regard to how much she has been sitting. His family member tells me that he sat pretty much all day yesterday the patient states he got up once to go outside but again for the most part he does not seem to be moving around much at all. 06/19/2019 upon evaluation today patient appears to be doing excellent in regard to his wound. In fact this has made great progress despite the fact that he apparently had food poisoning with diarrhea over the weekend when he was at the beach. This is indeed unfortunate and to be honest the only good thing about this is that I did discontinue doing the dressing and actually switched over to just using gentamicin cream applied to the wound bed followed by a good coating of Desitin which seems to have done excellent for him. There is no signs of active infection at this time. No fevers, chills, nausea, vomiting, or diarrhea. Readmission: 12/20/2020 upon evaluation today patient appears to be doing somewhat poorly in regard to the left gluteal region this is actually the same area that I previously took care of as well when he was here back in March 2021. Unfortunately since that time he is continue to have issues with intermittent opening and honestly it really has not seen just go away completely. Fortunately there does not appear to be any signs of active infection at this time which is great news. He has been using gentamicin cream they did use some collagen previously but its been a little while since they did that. That was left over from the previous time I saw the patient. With that being said he does have a chair at the sleep center he has spinal stenosis so is not able to sleep in the bed and lay flat or even on his side for that matter. That is one of the big complicating factors here. The patient again still does have issues with hypertension, diabetes mellitus type 2, spinal stenosis, and generalized muscle weakness. Patient History Information obtained from  Patient. Allergies morphine, codeine Family History Cancer - Father, Diabetes - Siblings, Hypertension - Mother,Father,Siblings, No family history of  Heart Disease, Hereditary Spherocytosis, Kidney Disease, Lung Disease, Seizures, Stroke, Thyroid Problems, Tuberculosis. Social History Never smoker, Marital Status - Married, Alcohol Use - Rarely, Drug Use - No History, Caffeine Use - Daily. Medical History Eyes Patient has history of Glaucoma Denies history of Cataracts, Optic Neuritis Hematologic/Lymphatic Denies history of Anemia, Hemophilia, Human Immunodeficiency Virus, Lymphedema, Sickle Cell Disease Respiratory Denies history of Aspiration, Asthma, Chronic Obstructive Pulmonary Disease (COPD), Pneumothorax, Sleep Apnea, Tuberculosis Cardiovascular Patient has history of Hypertension Denies history of Angina, Arrhythmia, Congestive Heart Failure, Coronary Artery Disease, Deep Vein Thrombosis, Hypotension, Myocardial Infarction, Peripheral Arterial Disease, Peripheral Venous Disease, Phlebitis, Vasculitis Gastrointestinal Mow, Thomes N. ([id number]) Denies history of Cirrhosis , Colitis, Crohn s, Hepatitis A, Hepatitis B, Hepatitis C Endocrine Patient has history of Type II Diabetes Genitourinary Denies history of End Stage Renal Disease Immunological Denies history of Lupus Erythematosus, Raynaud s, Scleroderma Integumentary (Skin) Patient has history of History of pressure wounds Denies history of History of Burn Musculoskeletal Patient has history of Gout, Osteoarthritis Denies history of Rheumatoid Arthritis, Osteomyelitis Neurologic Denies history of Dementia, Neuropathy, Quadriplegia, Paraplegia, Seizure Disorder Oncologic Denies history of Received Chemotherapy, Received Radiation Psychiatric Denies history of Anorexia/bulimia, Confinement Anxiety Patient is treated with Insulin, Oral Agents. Blood sugar is tested. Review of Systems (ROS) Eyes Complains or has  symptoms of Vision Changes, Glasses / Contacts. Denies complaints or symptoms of Dry Eyes. Ear/Nose/Mouth/Throat Denies complaints or symptoms of Difficult clearing ears, Sinusitis. Hematologic/Lymphatic Denies complaints or symptoms of Bleeding / Clotting Disorders, Human Immunodeficiency Virus. Respiratory Denies complaints or symptoms of Chronic or frequent coughs, Shortness of Breath. Cardiovascular Denies complaints or symptoms of Chest pain, LE edema. Gastrointestinal Denies complaints or symptoms of Frequent diarrhea, Nausea, Vomiting. Endocrine Denies complaints or symptoms of Hepatitis, Thyroid disease, Polydypsia (Excessive Thirst). Genitourinary Denies complaints or symptoms of Kidney failure/ Dialysis, Incontinence/dribbling. Immunological Denies complaints or symptoms of Hives, Itching. Integumentary (Skin) Complains or has symptoms of Wounds. Denies complaints or symptoms of Bleeding or bruising tendency, Breakdown, Swelling. Musculoskeletal Denies complaints or symptoms of Muscle Pain, Muscle Weakness. Neurologic Denies complaints or symptoms of Numbness/parasthesias, Focal/Weakness. Psychiatric Denies complaints or symptoms of Anxiety, Claustrophobia. Objective Constitutional sitting or standing blood pressure is within target range for patient.. pulse regular and within target range for patient.[name] respirations regular, non- labored and within target range for patient.[name] temperature within target range for patient.. Well-nourished and well-hydrated in no acute distress. Vitals Time Taken: 1:08 PM, Height: 72 in, Source: Stated, Weight: 254 lbs, Source: Measured, BMI: 34.4, Temperature: 98.0 F, Pulse: 96 bpm, Respiratory Rate: 18 breaths/min, Blood Pressure: 147/86 mmHg. Eyes conjunctiva clear no eyelid edema noted. pupils equal round and reactive to light and accommodation. Ears, Nose, Mouth, and Throat no gross abnormality of ear auricles or external auditory  canals. normal hearing noted during conversation. mucus membranes moist. Respiratory normal breathing without difficulty. DEEJAY, KOPPELMAN ([id number]) Cardiovascular 2+ dorsalis pedis/posterior tibialis pulses. no clubbing, cyanosis, significant edema, Musculoskeletal stooped posture. no significant deformity or arthritic changes, no loss or range of motion, no clubbing. Psychiatric this patient is able to make decisions and demonstrates good insight into disease process. Alert and Oriented x 3. pleasant and cooperative. General Notes: Upon inspection patient's wound bed actually showed signs of having some fat layer exposed based on what I am seeing currently. Fortunately there does not appear to be any evidence of active infection at this time which is great news. With that being said this means that this is a stage III although  very minor stage III pressure ulcer. Fortunately I do not see any evidence that there is infection currently but nonetheless he does have some discomfort especially when he sitting long periods of time is also some evidence of mild bruising/deep tissue injury. Integumentary (Hair, Skin) Wound #2 status is Open. Original cause of wound was Pressure Injury. The date acquired was: 12/21/2019. The wound is located on the Left Gluteus. The wound measures 1cm length x 1cm width x 0.1cm depth; 0.785cm^2 area and 0.079cm^3 volume. There is Fat Layer (Subcutaneous Tissue) exposed. There is no tunneling or undermining noted. There is a medium amount of serous drainage noted. There is large (67-100%) pink granulation within the wound bed. There is no necrotic tissue within the wound bed. Assessment Active Problems ICD-10 Pressure ulcer of left buttock, stage 3 Essential (primary) hypertension Type 2 diabetes mellitus with other skin ulcer Muscle weakness (generalized) Spinal stenosis, lumbosacral region Procedures Wound #2 Pre-procedure diagnosis of Wound #2 is a Pressure  Ulcer located on the Left Gluteus . There was a Chemical/Enzymatic/Mechanical debridement performed by Tommie Sams., PA-C. With the following instrument(s): saline and gauze. Other agent used was saline gauze. A time out was conducted at 15:50, prior to the start of the procedure. There was no bleeding. The procedure was tolerated well. Post Debridement Measurements: 0.5cm length x 0.3cm width x 0.1cm depth; 0.012cm^3 volume. Post debridement Stage noted as Category/Stage II. Character of Wound/Ulcer Post Debridement is stable. Post procedure Diagnosis Wound #2: Same as Pre-Procedure Plan Follow-up Appointments: Return Appointment in 2 weeks. Bathing/ Shower/ Hygiene: May shower; gently cleanse wound with antibacterial soap, rinse and pat dry prior to dressing wounds Off-Loading: Turn and reposition every 2 hours WOUND #2: - Gluteus Wound Laterality: Left Cleanser: Byram Ancillary Kit - 15 Day Supply (DME) (Generic) 1 x Per Day/30 Days Discharge Instructions: Use supplies as instructed; Kit contains: (15) Saline Bullets; (15) 3x3 Gauze; 15 pr Gloves Cleanser: Wound Cleanser (Generic) 1 x Per Day/30 Days Discharge Instructions: Wash your hands with soap and water. Remove old dressing, discard into plastic bag and place into trash. Cleanse the wound with Wound Cleanser prior to applying a clean dressing using gauze sponges, not tissues or cotton balls. Do not scrub or use excessive force. Pat dry using gauze sponges, not tissue or cotton balls. Primary Dressing: Silvercel Small 2x2 (in/in) (DME) (Generic) 1 x Per Day/30 Days Discharge Instructions: Apply Silvercel Small 2x2 (in/in) as instructed Primary Dressing: Zetuvit Plus Silicone Border Dressing 4x4 (in/in) (DME) (Generic) 1 x Per Day/30 Days Discharge Instructions: Secure dressing ZALAN, SHIDLER N. ([id number]) 1. Based on what I am seeing I think that we will get a go ahead and initiate treatment with a silver alginate dressing I  think this would be a good option for him. 2. I am also going to recommend that we have the patient continue to cover this with a border foam dressing which I think will do much better for him from a protection standpoint. 3. I am also going to suggest based on what we are seeing currently that we have the patient try and get up and walk around for at least 5 minutes every hour to try to get pressure off of the gluteal region I think this is good to be of utmost importance. I discussed that with him again today I do know we talked about this previously when I saw him as well. He does have a memory foam cushion for his chair that he uses currently.  We will see patient back for reevaluation in 1 week here in the clinic. If anything worsens or changes patient will contact our office for additional recommendations. Electronic Signature(s) Signed: 12/20/2020 2:09:01 PM By: Worthy Keeler PA-C Entered By: Worthy Keeler on 12/20/2020 14:09:01 [name] ([id number]) -------------------------------------------------------------------------------- ROS/PFSH Details Patient Name: [name] Date of Service: 12/20/2020 12:45 PM Medical Record Number: [id number] Patient Account Number: [id number] Date of Birth/Sex: 1940/05/06 (80 y.o. M) Treating RN: Dolan Amen Primary Care Provider: Elsie Stain Other Clinician: Referring Provider: Elsie Stain Treating Provider/Extender: Skipper Cliche in Treatment: 0 Information Obtained From Patient Eyes Complaints and Symptoms: Positive for: Vision Changes; Glasses / Contacts Negative for: Dry Eyes Medical History: Positive for: Glaucoma Negative for: Cataracts; Optic Neuritis Ear/Nose/Mouth/Throat Complaints and Symptoms: Negative for: Difficult clearing ears; Sinusitis Hematologic/Lymphatic Complaints and Symptoms: Negative for: Bleeding / Clotting Disorders; Human Immunodeficiency Virus Medical History: Negative for: Anemia;  Hemophilia; Human Immunodeficiency Virus; Lymphedema; Sickle Cell Disease Respiratory Complaints and Symptoms: Negative for: Chronic or frequent coughs; Shortness of Breath Medical History: Negative for: Aspiration; Asthma; Chronic Obstructive Pulmonary Disease (COPD); Pneumothorax; Sleep Apnea; Tuberculosis Cardiovascular Complaints and Symptoms: Negative for: Chest pain; LE edema Medical History: Positive for: Hypertension Negative for: Angina; Arrhythmia; Congestive Heart Failure; Coronary Artery Disease; Deep Vein Thrombosis; Hypotension; Myocardial Infarction; Peripheral Arterial Disease; Peripheral Venous Disease; Phlebitis; Vasculitis Gastrointestinal Complaints and Symptoms: Negative for: Frequent diarrhea; Nausea; Vomiting Medical History: Negative for: Cirrhosis ; Colitis; Crohnos; Hepatitis A; Hepatitis B; Hepatitis C Endocrine Complaints and Symptoms: Negative for: Hepatitis; Thyroid disease; Polydypsia (Excessive Thirst) Medical History: Positive for: Type II Diabetes Time with diabetes: 15 years Treated with: Insulin, Oral agents Gindlesperger, Maron N. ([id number]) Blood sugar tested every day: Yes Tested : dexcom Genitourinary Complaints and Symptoms: Negative for: Kidney failure/ Dialysis; Incontinence/dribbling Medical History: Negative for: End Stage Renal Disease Immunological Complaints and Symptoms: Negative for: Hives; Itching Medical History: Negative for: Lupus Erythematosus; Raynaudos; Scleroderma Integumentary (Skin) Complaints and Symptoms: Positive for: Wounds Negative for: Bleeding or bruising tendency; Breakdown; Swelling Medical History: Positive for: History of pressure wounds Negative for: History of Burn Musculoskeletal Complaints and Symptoms: Negative for: Muscle Pain; Muscle Weakness Medical History: Positive for: Gout; Osteoarthritis Negative for: Rheumatoid Arthritis; Osteomyelitis Neurologic Complaints and Symptoms: Negative for:  Numbness/parasthesias; Focal/Weakness Medical History: Negative for: Dementia; Neuropathy; Quadriplegia; Paraplegia; Seizure Disorder Psychiatric Complaints and Symptoms: Negative for: Anxiety; Claustrophobia Medical History: Negative for: Anorexia/bulimia; Confinement Anxiety Oncologic Medical History: Negative for: Received Chemotherapy; Received Radiation HBO Extended History Items Eyes: Glaucoma Immunizations Pneumococcal Vaccine: Received Pneumococcal Vaccination: Yes Received Pneumococcal Vaccination On or After 60th Birthday: Yes Implantable Devices VIRGAL, WARMUTH ([id number]) None Family and Social History Cancer: Yes - Father; Diabetes: Yes - Siblings; Heart Disease: No; Hereditary Spherocytosis: No; Hypertension: Yes - Mother,Father,Siblings; Kidney Disease: No; Lung Disease: No; Seizures: No; Stroke: No; Thyroid Problems: No; Tuberculosis: No; Never smoker; Marital Status - Married; Alcohol Use: Rarely; Drug Use: No History; Caffeine Use: Daily; Financial Concerns: No; Food, Clothing or Shelter Needs: No; Support System Lacking: No; Transportation Concerns: No Electronic Signature(s) Signed: 12/20/2020 4:36:36 PM By: Dolan Amen RN Signed: 12/20/2020 4:54:51 PM By: Worthy Keeler PA-C Entered By: Dolan Amen on 12/20/2020 13:13:21 [name] ([id number]) -------------------------------------------------------------------------------- SuperBill Details Patient Name: [name] Date of Service: 12/20/2020 Medical Record Number: [id number] Patient Account Number: [id number] Date of Birth/Sex: Jan 03, 1941 (80 y.o. M) Treating RN: Dolan Amen Primary Care Provider: Elsie Stain Other Clinician: Referring Provider: Elsie Stain  Treating Provider/Extender: Jeri Cos Weeks in Treatment: 0 Diagnosis Coding ICD-10 Codes Code Description [phone] Pressure ulcer of left buttock, stage 3 I10 Essential (primary) hypertension E11.622 Type 2  diabetes mellitus with other skin ulcer M62.81 Muscle weakness (generalized) M48.07 Spinal stenosis, lumbosacral region Facility Procedures CPT4 Code: 93267124 Description: 99214 - WOUND CARE VISIT-LEV 4 EST PT Modifier: Quantity: 1 Physician Procedures CPT4 Code: 5809983 Description: 38250 - WC PHYS LEVEL 4 - EST PT Modifier: Quantity: 1 CPT4 Code: Description: ICD-10 Diagnosis Description 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, stage 3 I10 Essential (primary) hypertension E11.622 Type 2 diabetes mellitus with other skin ulcer M62.81 Muscle weakness (generalized) Modifier: Quantity: Electronic Signature(s) Signed: 12/20/2020 2:09:36 PM By: Worthy Keeler PA-C Previous Signature: 12/20/2020 2:04:10 PM Version By: Dolan Amen RN Entered By: Worthy Keeler on 12/20/2020 14:09:36

## 2020-12-20 NOTE — Progress Notes (Signed)
[name], [name] ([id number]) Visit Report for 12/20/2020 Abuse/Suicide Risk Screen Details Patient Name: [name] Date of Service: 12/20/2020 12:45 PM Medical Record Number: [id number] Patient Account Number: [id number] Date of Birth/Sex: [birth year] (79 y.o. M) Treating RN: Dolan Amen Primary Care Gardy Montanari: Elsie Stain Other Clinician: Referring Macee Venables: Elsie Stain Treating Diamantina Edinger/Extender: Jeri Cos Weeks in Treatment: 0 Abuse/Suicide Risk Screen Items Answer ABUSE RISK SCREEN: Has anyone close to you tried to hurt or harm you recentlyo No Do you feel uncomfortable with anyone in your familyo No Has anyone forced you do things that you didnot want to doo No Electronic Signature(s) Signed: 12/20/2020 4:36:36 PM By: Dolan Amen RN Entered By: Dolan Amen on 12/20/2020 13:13:29 [name] ([id number]) -------------------------------------------------------------------------------- Activities of Daily Living Details Patient Name: [name] Date of Service: 12/20/2020 12:45 PM Medical Record Number: [id number] Patient Account Number: [id number] Date of Birth/Sex: 1940/06/26 (79 y.o. M) Treating RN: Dolan Amen Primary Care Yocelin Vanlue: Elsie Stain Other Clinician: Referring Jahmil Macleod: Elsie Stain Treating Fairley Copher/Extender: Skipper Cliche in Treatment: 0 Activities of Daily Living Items Answer Activities of Daily Living (Please select one for each item) Drive Automobile Completely Able Take Medications Completely Able Use Telephone Completely Able Care for Appearance Completely Able Use Toilet Completely Able Bath / Shower Completely Able Dress Self Completely Able Feed Self Completely Able Walk Completely Able Get In / Out Bed Completely Able Housework Completely Able Prepare Meals Completely Able Handle Money Completely Able Shop for Self Completely Able Electronic Signature(s) Signed: 12/20/2020 4:36:36 PM By: Dolan Amen RN Entered By: Dolan Amen on 12/20/2020 13:13:48 [name] ([id number]) -------------------------------------------------------------------------------- Education Screening Details Patient Name: [name] Date of Service: 12/20/2020 12:45 PM Medical Record Number: [id number] Patient Account Number: [id number] Date of Birth/Sex: 1940-05-06 (79 y.o. M) Treating RN: Dolan Amen Primary Care Jun Rightmyer: Elsie Stain Other Clinician: Referring Marrie Chandra: Elsie Stain Treating Yonatan Guitron/Extender: Skipper Cliche in Treatment: 0 Primary Learner Assessed: Patient Learning Preferences/Education Level/Primary Language Learning Preference: Explanation, Demonstration Highest Education Level: College or Above Preferred Language: English Cognitive Barrier Language Barrier: No Translator Needed: No Memory Deficit: No Emotional Barrier: No Cultural/Religious Beliefs Affecting Medical Care: No Physical Barrier Impaired Vision: No Impaired Hearing: No Decreased Hand dexterity: No Knowledge/Comprehension Knowledge Level: Medium Comprehension Level: Medium Ability to understand written instructions: Medium Ability to understand verbal instructions: Medium Motivation Anxiety Level: Calm Cooperation: Cooperative Education Importance: Acknowledges Need Interest in Health Problems: Asks Questions Perception: Coherent Willingness to Engage in Self-Management Medium Activities: Readiness to Engage in Self-Management Medium Activities: Electronic Signature(s) Signed: 12/20/2020 4:36:36 PM By: Dolan Amen RN Entered By: Dolan Amen on 12/20/2020 13:14:09 [name] ([id number]) -------------------------------------------------------------------------------- Fall Risk Assessment Details Patient Name: [name] Date of Service: 12/20/2020 12:45 PM Medical Record Number: [id number] Patient Account Number: [id number] Date of Birth/Sex: Jan 18, 1941  (79 y.o. M) Treating RN: Dolan Amen Primary Care Luiz Trumpower: Elsie Stain Other Clinician: Referring Dariane Natzke: Elsie Stain Treating Tarika Mckethan/Extender: Skipper Cliche in Treatment: 0 Fall Risk Assessment Items Have you had 2 or more falls in the last 12 monthso 0 No Have you had any fall that resulted in injury in the last 12 monthso 0 No FALLS RISK SCREEN History of falling - immediate or within 3 months 0 No Secondary diagnosis (Do you have 2 or more medical diagnoseso) 15 Yes Ambulatory aid None/bed rest/wheelchair/nurse 0 No Crutches/cane/walker 15 Yes Furniture 0 No Intravenous therapy Access/Saline/Heparin Lock 0 No Gait/Transferring Normal/ bed rest/  wheelchair 0 No Weak (short steps with or without shuffle, stooped but able to lift head while walking, may 10 Yes seek support from furniture) Impaired (short steps with shuffle, may have difficulty arising from chair, head down, impaired 0 No balance) Mental Status Oriented to own ability 0 Yes Electronic Signature(s) Signed: 12/20/2020 4:36:36 PM By: Dolan Amen RN Entered By: Dolan Amen on 12/20/2020 13:14:40 [name] ([id number]) -------------------------------------------------------------------------------- Foot Assessment Details Patient Name: [name] Date of Service: 12/20/2020 12:45 PM Medical Record Number: [id number] Patient Account Number: [id number] Date of Birth/Sex: Feb 25, 1941 (79 y.o. M) Treating RN: Dolan Amen Primary Care Jasreet Dickie: Elsie Stain Other Clinician: Referring Wendie Diskin: Elsie Stain Treating Jakala Herford/Extender: Jeri Cos Weeks in Treatment: 0 Foot Assessment Items Site Locations + = Sensation present, - = Sensation absent, C = Callus, U = Ulcer R = Redness, W = Warmth, M = Maceration, PU = Pre-ulcerative lesion F = Fissure, S = Swelling, D = Dryness Assessment Right: Left: Other Deformity: No No Prior Foot Ulcer: No No Prior Amputation: No  No Charcot Joint: No No Ambulatory Status: Ambulatory With Help Assistance Device: Cane Gait: Administrator, arts) Signed: 12/20/2020 4:36:36 PM By: Dolan Amen RN Entered By: Dolan Amen on 12/20/2020 13:15:34 [name] ([id number]) -------------------------------------------------------------------------------- Nutrition Risk Screening Details Patient Name: [name] Date of Service: 12/20/2020 12:45 PM Medical Record Number: [id number] Patient Account Number: [id number] Date of Birth/Sex: [birth year] (79 y.o. M) Treating RN: Dolan Amen Primary Care Amariz Flamenco: Elsie Stain Other Clinician: Referring Joyce Leckey: Elsie Stain Treating Jaizon Deroos/Extender: Jeri Cos Weeks in Treatment: 0 Height (in): 72 Weight (lbs): 254 Body Mass Index (BMI): 34.4 Nutrition Risk Screening Items Score Screening NUTRITION RISK SCREEN: I have an illness or condition that made me change the kind and/or amount of food I eat 0 No I eat fewer than two meals per day 0 No I eat few fruits and vegetables, or milk products 0 No I have three or more drinks of beer, liquor or wine almost every day 0 No I have tooth or mouth problems that make it hard for me to eat 0 No I don't always have enough money to buy the food I need 0 No I eat alone most of the time 0 No I take three or more different prescribed or over-the-counter drugs a day 1 Yes Without wanting to, I have lost or gained 10 pounds in the last six months 0 No I am not always physically able to shop, cook and/or feed myself 0 No Nutrition Protocols Good Risk Protocol 0 No interventions needed Moderate Risk Protocol High Risk Proctocol Risk Level: Good Risk Score: 1 Electronic Signature(s) Signed: 12/20/2020 4:36:36 PM By: Dolan Amen RN Entered By: Dolan Amen on 12/20/2020 13:15:24

## 2020-12-27 DIAGNOSIS — L89320 Pressure ulcer of left buttock, unstageable: Secondary | ICD-10-CM | POA: Diagnosis not present

## 2021-01-03 ENCOUNTER — Other Ambulatory Visit: Payer: Self-pay

## 2021-01-03 ENCOUNTER — Encounter: Payer: HMO | Attending: Physician Assistant | Admitting: Physician Assistant

## 2021-01-03 DIAGNOSIS — L89323 Pressure ulcer of left buttock, stage 3: Secondary | ICD-10-CM | POA: Insufficient documentation

## 2021-01-03 DIAGNOSIS — M4807 Spinal stenosis, lumbosacral region: Secondary | ICD-10-CM | POA: Insufficient documentation

## 2021-01-03 DIAGNOSIS — M6281 Muscle weakness (generalized): Secondary | ICD-10-CM | POA: Diagnosis not present

## 2021-01-03 DIAGNOSIS — I10 Essential (primary) hypertension: Secondary | ICD-10-CM | POA: Insufficient documentation

## 2021-01-03 DIAGNOSIS — E119 Type 2 diabetes mellitus without complications: Secondary | ICD-10-CM | POA: Diagnosis not present

## 2021-01-03 NOTE — Progress Notes (Addendum)
MURRAY, DURRELL ([id number]) Visit Report for 01/03/2021 Chief Complaint Document Details Patient Name: [name], [name] Date of Service: 01/03/2021 2:00 PM Medical Record Number: [id number] Patient Account Number: [id number] Date of Birth/Sex: 07-Feb-1941 (80 y.o. M) Treating RN: Dolan Amen Primary Care Provider: Elsie Stain Other Clinician: Referring Provider: Elsie Stain Treating Provider/Extender: Jeri Cos Weeks in Treatment: 2 Information Obtained from: Patient Chief Complaint Left gluteal pressure ulcer Electronic Signature(s) Signed: 01/03/2021 2:05:49 PM By: Worthy Keeler PA-C Entered By: Worthy Keeler on 01/03/2021 14:05:49 [name] ([id number]) -------------------------------------------------------------------------------- HPI Details Patient Name: [name] Date of Service: 01/03/2021 2:00 PM Medical Record Number: [id number] Patient Account Number: [id number] Date of Birth/Sex: February 02, 1941 (80 y.o. M) Treating RN: Dolan Amen Primary Care Provider: Elsie Stain Other Clinician: Referring Provider: Elsie Stain Treating Provider/Extender: Skipper Cliche in Treatment: 2 History of Present Illness HPI Description: 05/30/2019 upon evaluation today patient appears for initial evaluation in our clinic concerning issues that he has been having on the left gluteal region due to a pressure ulcer unfortunately. He does have a lot of weakness as well as issues with hip pain that causes him to not be able to sleep in a bed that has been quite a problem for him unfortunately. 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 Nonetheless this has led to chronic pain in the right hip. The patient is slow-moving but is able to ambulate today. He does typically sleep in his lift chair. He does not really move  around as much as he probably should although he can and I think this is one area where I am going to suggest that we go ahead and have him get up and move around more frequently to try to help with offloading I discussed how this can be beneficial. The good news is his wounds do not seem to be too deep. No fever chills noted 06/05/2019 upon evaluation today patient's wound is showing some signs of slight improvement which is good news. With that being said there is unfortunately no evidence of significant overall improvement which is the one issue that we are seeing at this time with regard to how much she has been sitting. His family member tells me that he sat pretty much all day yesterday the patient states he got up once to go outside but again for the most part he does not seem to be moving around much at all. 06/19/2019 upon evaluation today patient appears to be doing excellent in regard to his wound. In fact this has made great progress despite the fact that he apparently had food poisoning with diarrhea over the weekend when he was at the beach. This is indeed unfortunate and to be honest the only good thing about this is that I did discontinue doing the dressing and actually switched over to just using gentamicin cream applied to the wound bed followed by a good coating of Desitin which seems to have done excellent for him. There is no signs of active infection at this time. No fevers, chills, nausea, vomiting, or diarrhea. Readmission: 12/20/2020 upon evaluation today patient appears to be doing somewhat poorly in regard to the left gluteal region this is actually the same area that I previously took care of as well when he was here back in March 2021. Unfortunately since that time he is continue  to have issues with intermittent opening and honestly it really has not seen just go away completely. Fortunately there does not appear to be any signs of active infection at this time which is great  news. He has been using gentamicin cream they did use some collagen previously but its been a little while since they did that. That was left over from the previous time I saw the patient. With that being said he does have a chair at the sleep center he has spinal stenosis so is not able to sleep in the bed and lay flat or even on his side for that matter. That is one of the big complicating factors here. The patient again still does have issues with hypertension, diabetes mellitus type 2, spinal stenosis, and generalized muscle weakness. 01/03/2021 upon evaluation today patient's wound is actually showing signs of being somewhat open in regard to his gluteal region although it seems to be doing much better than previous. Fortunately there does not appear to be any signs of active infection which is great news. No fevers, chills, nausea, vomiting, or diarrhea. Overall I am extremely pleased with where we stand currently. Electronic Signature(s) Signed: 01/03/2021 2:52:39 PM By: Worthy Keeler PA-C Entered By: Worthy Keeler on 01/03/2021 14:52:39 [name] ([id number]) -------------------------------------------------------------------------------- Physical Exam Details Patient Name: [name] Date of Service: 01/03/2021 2:00 PM Medical Record Number: [id number] Patient Account Number: [id number] Date of Birth/Sex: 1940/08/30 (80 y.o. M) Treating RN: Dolan Amen Primary Care Provider: Elsie Stain Other Clinician: Referring Provider: Elsie Stain Treating Provider/Extender: Jeri Cos Weeks in Treatment: 2 Constitutional Well-nourished and well-hydrated in no acute distress. Respiratory normal breathing without difficulty. Psychiatric this patient is able to make decisions and demonstrates good insight into disease process. Alert and Oriented x 3. pleasant and cooperative. Notes Upon inspection patient's wound bed showed signs of good granulation epithelization there  was a little bit of thickened tissue around the edge I was able to clean himself with saline and gauze no sharp debridement necessary today and the wound bed appears to be doing awesome. Electronic Signature(s) Signed: 01/03/2021 2:52:55 PM By: Worthy Keeler PA-C Entered By: Worthy Keeler on 01/03/2021 14:52:54 [name] ([id number]) -------------------------------------------------------------------------------- Physician Orders Details Patient Name: [name] Date of Service: 01/03/2021 2:00 PM Medical Record Number: [id number] Patient Account Number: [id number] Date of Birth/Sex: April 29, 1940 (80 y.o. M) Treating RN: Dolan Amen Primary Care Provider: Elsie Stain Other Clinician: Referring Provider: Elsie Stain Treating Provider/Extender: Skipper Cliche in Treatment: 2 Verbal / Phone Orders: No Diagnosis Coding ICD-10 Coding Code Description [phone] Pressure ulcer of left buttock, stage 3 I10 Essential (primary) hypertension E11.622 Type 2 diabetes mellitus with other skin ulcer M62.81 Muscle weakness (generalized) M48.07 Spinal stenosis, lumbosacral region Follow-up Appointments o Return Appointment in 2 weeks. Bathing/ Shower/ Hygiene o May shower; gently cleanse wound with antibacterial soap, rinse and pat dry prior to dressing wounds Off-Loading o Turn and reposition every 2 hours Wound Treatment Wound #2 - Gluteus Wound Laterality: Left Cleanser: Wound Cleanser (Generic) 1 x Per Day/30 Days Discharge Instructions: Wash your hands with soap and water. Remove old dressing, discard into plastic bag and place into trash. Cleanse the wound with Wound Cleanser prior to applying a clean dressing using gauze sponges, not tissues or cotton balls. Do not scrub or use excessive force. Pat dry using gauze sponges, not tissue or cotton balls. Primary Dressing: Silvercel Small 2x2 (in/in) (Generic) 1 x Per Day/30 Days Discharge  Instructions: Apply  Silvercel Small 2x2 (in/in) as instructed Primary Dressing: Zetuvit Plus Silicone Border Dressing 4x4 (in/in) (Generic) 1 x Per Day/30 Days Discharge Instructions: Secure dressing Electronic Signature(s) Signed: 01/03/2021 4:44:56 PM By: Dolan Amen RN Signed: 01/03/2021 6:01:56 PM By: Worthy Keeler PA-C Entered By: Dolan Amen on 01/03/2021 14:47:05 [name] ([id number]) -------------------------------------------------------------------------------- Problem List Details Patient Name: [name] Date of Service: 01/03/2021 2:00 PM Medical Record Number: [id number] Patient Account Number: [id number] Date of Birth/Sex: [birth year] (80 y.o. M) Treating RN: Dolan Amen Primary Care Provider: Elsie Stain Other Clinician: Referring Provider: Elsie Stain Treating Provider/Extender: Jeri Cos Weeks in Treatment: 2 Active Problems ICD-10 Encounter Code Description Active Date MDM Diagnosis [phone] Pressure ulcer of left buttock, stage 3 12/20/2020 No Yes I10 Essential (primary) hypertension 12/20/2020 No Yes E11.622 Type 2 diabetes mellitus with other skin ulcer 12/20/2020 No Yes M62.81 Muscle weakness (generalized) 12/20/2020 No Yes M48.07 Spinal stenosis, lumbosacral region 12/20/2020 No Yes Inactive Problems Resolved Problems Electronic Signature(s) Signed: 01/03/2021 2:05:43 PM By: Worthy Keeler PA-C Entered By: Worthy Keeler on 01/03/2021 14:05:43 [name] ([id number]) -------------------------------------------------------------------------------- Progress Note Details Patient Name: [name] Date of Service: 01/03/2021 2:00 PM Medical Record Number: [id number] Patient Account Number: [id number] Date of Birth/Sex: [birth year] (80 y.o. M) Treating RN: Dolan Amen Primary Care Provider: Elsie Stain Other Clinician: Referring Provider: Elsie Stain Treating Provider/Extender: Skipper Cliche in Treatment: 2 Subjective Chief  Complaint Information obtained from Patient Left gluteal pressure ulcer History of Present Illness (HPI) 05/30/2019 upon evaluation today patient appears for initial evaluation in our clinic concerning issues that he has been having on the left gluteal region due to a pressure ulcer unfortunately. He does have a lot of weakness as well as issues with hip pain that causes him to not be able to sleep in a bed that has been quite a problem for him unfortunately. 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 Nonetheless this has led to chronic pain in the right hip. The patient is slow-moving but is able to ambulate today. He does typically sleep in his lift chair. He does not really move around as much as he probably should although he can and I think this is one area where I am going to suggest that we go ahead and have him get up and move around more frequently to try to help with offloading I discussed how this can be beneficial. The good news is his wounds do not seem to be too deep. No fever chills noted 06/05/2019 upon evaluation today patient's wound is showing some signs of slight improvement which is good news. With that being said there is unfortunately no evidence of significant overall improvement which is the one issue that we are seeing at this time with regard to how much she has been sitting. His family member tells me that he sat pretty much all day yesterday the patient states he got up once to go outside but again for the most part he does not seem to be moving around much at all. 06/19/2019 upon evaluation today patient appears to be doing excellent in regard to his wound. In fact this has made great progress despite the fact that he apparently had food poisoning with diarrhea over the weekend when he was at the beach. This is indeed  unfortunate and  to be honest the only good thing about this is that I did discontinue doing the dressing and actually switched over to just using gentamicin cream applied to the wound bed followed by a good coating of Desitin which seems to have done excellent for him. There is no signs of active infection at this time. No fevers, chills, nausea, vomiting, or diarrhea. Readmission: 12/20/2020 upon evaluation today patient appears to be doing somewhat poorly in regard to the left gluteal region this is actually the same area that I previously took care of as well when he was here back in March 2021. Unfortunately since that time he is continue to have issues with intermittent opening and honestly it really has not seen just go away completely. Fortunately there does not appear to be any signs of active infection at this time which is great news. He has been using gentamicin cream they did use some collagen previously but its been a little while since they did that. That was left over from the previous time I saw the patient. With that being said he does have a chair at the sleep center he has spinal stenosis so is not able to sleep in the bed and lay flat or even on his side for that matter. That is one of the big complicating factors here. The patient again still does have issues with hypertension, diabetes mellitus type 2, spinal stenosis, and generalized muscle weakness. 01/03/2021 upon evaluation today patient's wound is actually showing signs of being somewhat open in regard to his gluteal region although it seems to be doing much better than previous. Fortunately there does not appear to be any signs of active infection which is great news. No fevers, chills, nausea, vomiting, or diarrhea. Overall I am extremely pleased with where we stand currently. Objective Constitutional Well-nourished and well-hydrated in no acute distress. Vitals Time Taken: 2:13 PM, Height: 72 in, Weight: 254 lbs, BMI:  34.4, Temperature: 98.5 F, Pulse: 97 bpm, Respiratory Rate: 18 breaths/min, Blood Pressure: 137/82 mmHg. Respiratory normal breathing without difficulty. Psychiatric this patient is able to make decisions and demonstrates good insight into disease process. Alert and Oriented x 3. pleasant and cooperative. BASHAR, MILAM ([id number]) General Notes: Upon inspection patient's wound bed showed signs of good granulation epithelization there was a little bit of thickened tissue around the edge I was able to clean himself with saline and gauze no sharp debridement necessary today and the wound bed appears to be doing awesome. Integumentary (Hair, Skin) Wound #2 status is Open. Original cause of wound was Pressure Injury. The date acquired was: 12/21/2019. The wound has been in treatment 2 weeks. The wound is located on the Left Gluteus. The wound measures 0.5cm length x 0.5cm width x 0.1cm depth; 0.196cm^2 area and 0.02cm^3 volume. There is Fat Layer (Subcutaneous Tissue) exposed. There is no tunneling or undermining noted. There is a medium amount of serous drainage noted. There is large (67-100%) pink granulation within the wound bed. There is no necrotic tissue within the wound bed. Assessment Active Problems ICD-10 Pressure ulcer of left buttock, stage 3 Essential (primary) hypertension Type 2 diabetes mellitus with other skin ulcer Muscle weakness (generalized) Spinal stenosis, lumbosacral region Plan Follow-up Appointments: Return Appointment in 2 weeks. Bathing/ Shower/ Hygiene: May shower; gently cleanse wound with antibacterial soap, rinse and pat dry prior to dressing wounds Off-Loading: Turn and reposition every 2 hours WOUND #2: - Gluteus Wound Laterality: Left Cleanser: Wound Cleanser (Generic) 1 x Per Day/30 Days Discharge  Instructions: Wash your hands with soap and water. Remove old dressing, discard into plastic bag and place into trash. Cleanse the wound with Wound  Cleanser prior to applying a clean dressing using gauze sponges, not tissues or cotton balls. Do not scrub or use excessive force. Pat dry using gauze sponges, not tissue or cotton balls. Primary Dressing: Silvercel Small 2x2 (in/in) (Generic) 1 x Per Day/30 Days Discharge Instructions: Apply Silvercel Small 2x2 (in/in) as instructed Primary Dressing: Zetuvit Plus Silicone Border Dressing 4x4 (in/in) (Generic) 1 x Per Day/30 Days Discharge Instructions: Secure dressing 1. Would recommend that we going continue with wound care measures as before utilizing silver alginate I think that still the right way to go. 2. I am also can recommend that we have the patient continue with the Zetuvit border foam dressing to cover. 3. I would also recommend he continue to avoid sitting long periods of time that is obviously the worst thing that can be done at this point if he is careful in that regard I think things will continue to improve. We will see patient back for reevaluation in 2 weeks here in the clinic. If anything worsens or changes patient will contact our office for additional recommendations. Electronic Signature(s) Signed: 01/03/2021 2:53:21 PM By: Worthy Keeler PA-C Entered By: Worthy Keeler on 01/03/2021 14:53:21 [name] ([id number]) -------------------------------------------------------------------------------- SuperBill Details Patient Name: [name] Date of Service: 01/03/2021 Medical Record Number: [id number] Patient Account Number: [id number] Date of Birth/Sex: 1941/03/14 (80 y.o. M) Treating RN: Dolan Amen Primary Care Provider: Elsie Stain Other Clinician: Referring Provider: Elsie Stain Treating Provider/Extender: Jeri Cos Weeks in Treatment: 2 Diagnosis Coding ICD-10 Codes Code Description [phone] Pressure ulcer of left buttock, stage 3 I10 Essential (primary) hypertension E11.622 Type 2 diabetes mellitus with other skin ulcer M62.81 Muscle  weakness (generalized) M48.07 Spinal stenosis, lumbosacral region Facility Procedures CPT4 Code: [id number] Description: 99213 - WOUND CARE VISIT-LEV 3 EST PT Modifier: Quantity: 1 Physician Procedures CPT4 Code: [id number] Description: 77116 - WC PHYS LEVEL 3 - EST PT Modifier: Quantity: 1 CPT4 Code: Description: ICD-10 Diagnosis Description 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, stage 3 I10 Essential (primary) hypertension E11.622 Type 2 diabetes mellitus with other skin ulcer M62.81 Muscle weakness (generalized) Modifier: Quantity: Electronic Signature(s) Signed: 01/03/2021 2:53:34 PM By: Worthy Keeler PA-C Entered By: Worthy Keeler on 01/03/2021 14:53:34

## 2021-01-03 NOTE — Progress Notes (Addendum)
BRISTOL, SOY ([id number]) Visit Report for 01/03/2021 Arrival Information Details Patient Name: [name], [name] Date of Service: 01/03/2021 2:00 PM Medical Record Number: [id number] Patient Account Number: [id number] Date of Birth/Sex: Nov 16, 1940 (80 y.o. M) Treating RN: Dolan Amen Primary Care Leniyah Martell: Elsie Stain Other Clinician: Referring Zay Yeargan: Elsie Stain Treating Daril Warga/Extender: Skipper Cliche in Treatment: 2 Visit Information History Since Last Visit Pain Present Now: No Patient Arrived: Cane Arrival Time: 14:10 Accompanied By: wife Transfer Assistance: None Patient Identification Verified: Yes Secondary Verification Process Completed: Yes Electronic Signature(s) Signed: 01/03/2021 4:44:56 PM By: Dolan Amen RN Entered By: Dolan Amen on 01/03/2021 14:13:41 [name] ([id number]) -------------------------------------------------------------------------------- Clinic Level of Care Assessment Details Patient Name: [name] Date of Service: 01/03/2021 2:00 PM Medical Record Number: [id number] Patient Account Number: [id number] Date of Birth/Sex: 12-21-1940 (80 y.o. M) Treating RN: Dolan Amen Primary Care Annamay Laymon: Elsie Stain Other Clinician: Referring Athena Baltz: Elsie Stain Treating Tru Leopard/Extender: Skipper Cliche in Treatment: 2 Clinic Level of Care Assessment Items TOOL 4 Quantity Score X - Use when only an EandM is performed on FOLLOW-UP visit 1 0 ASSESSMENTS - Nursing Assessment / Reassessment X - Reassessment of Co-morbidities (includes updates in patient status) 1 10 X- 1 5 Reassessment of Adherence to Treatment Plan ASSESSMENTS - Wound and Skin Assessment / Reassessment X - Simple Wound Assessment / Reassessment - one wound 1 5 _0  - 0 Complex Wound Assessment / Reassessment - multiple wounds _1  - 0 Dermatologic / Skin Assessment (not related to wound area) ASSESSMENTS - Focused Assessment _2  -  Circumferential Edema Measurements - multi extremities 0 _3  - 0 Nutritional Assessment / Counseling / Intervention _4  - 0 Lower Extremity Assessment (monofilament, tuning fork, pulses) _5  - 0 Peripheral Arterial Disease Assessment (using hand held doppler) ASSESSMENTS - Ostomy and/or Continence Assessment and Care _6  - Incontinence Assessment and Management 0 _7  - 0 Ostomy Care Assessment and Management (repouching, etc.) PROCESS - Coordination of Care X - Simple Patient / Family Education for ongoing care 1 15 _8  - 0 Complex (extensive) Patient / Family Education for ongoing care _9  - 0 Staff obtains Programmer, systems, Records, Test Results / Process Orders _10  - 0 Staff telephones HHA, Nursing Homes / Clarify orders / etc _11  - 0 Routine Transfer to another Facility (non-emergent condition) _12  - 0 Routine Hospital Admission (non-emergent condition) _13  - 0 New Admissions / Biomedical engineer / Ordering NPWT, Apligraf, etc. _14  - 0 Emergency Hospital Admission (emergent condition) X- 1 10 Simple Discharge Coordination _15  - 0 Complex (extensive) Discharge Coordination PROCESS - Special Needs _16  - Pediatric / Minor Patient Management 0 _17  - 0 Isolation Patient Management _18  - 0 Hearing / Language / Visual special needs _19  - 0 Assessment of Community assistance (transportation, D/C planning, etc.) _20  - 0 Additional assistance / Altered mentation _21  - 0 Support Surface(s) Assessment (bed, cushion, seat, etc.) INTERVENTIONS - Wound Cleansing / Measurement Bourbeau, Plummer N. ([id number]) X- 1 5 Simple Wound Cleansing - one wound _22  - 0 Complex Wound Cleansing - multiple wounds X- 1 5 Wound Imaging (photographs - any number of wounds) _23  - 0 Wound Tracing (instead of photographs) X- 1 5 Simple Wound Measurement - one wound _24  - 0 Complex Wound Measurement - multiple wounds INTERVENTIONS - Wound Dressings _25  - Small Wound Dressing one or multiple wounds 0 X- 1 15 Medium  Wound Dressing one or multiple wounds _26  - 0 Large Wound Dressing one or multiple wounds <IDPOEUMPNTIRWERX>_5<\/QMGQQPYPPJKDTOIZ>_12  - 0 Application of Medications -  topical <HCWCBJSEGBTDVVOH>_6<\/WVPXTGGYIRSWNIOE>_7  - 0 Application of Medications - injection INTERVENTIONS - Miscellaneous _1  - External ear exam 0 _2  - 0 Specimen Collection (cultures, biopsies, blood, body fluids, etc.) _3  - 0 Specimen(s) / Culture(s) sent or taken to Lab for analysis _4  - 0 Patient Transfer (multiple staff / Harrel Lemon Lift / Similar devices) _5  - 0 Simple Staple / Suture removal (25 or less) _6  - 0 Complex Staple / Suture removal (26 or more) _7  - 0 Hypo / Hyperglycemic Management (close monitor of Blood Glucose) _8  - 0 Ankle / Brachial Index (ABI) - do not check if billed separately X- 1 5 Vital Signs Has the patient been seen at the hospital within the last three years: Yes Total Score: 80 Level Of Care: New/Established - Level 3 Electronic Signature(s) Signed: 01/03/2021 4:44:56 PM By: Dolan Amen RN Entered By: Dolan Amen on 01/03/2021 14:47:26 [name] ([id number]) -------------------------------------------------------------------------------- Encounter Discharge Information Details Patient Name: [name] Date of Service: 01/03/2021 2:00 PM Medical Record Number: [id number] Patient Account Number: [id number] Date of Birth/Sex: November 08, 1940 (79 y.o. M) Treating RN: Dolan Amen Primary Care Pearson Reasons: Elsie Stain Other Clinician: Referring Aarish Rockers: Elsie Stain Treating Melisia Leming/Extender: Skipper Cliche in Treatment: 2 Encounter Discharge Information Items Discharge Condition: Stable Ambulatory Status: Cane Discharge Destination: Home Transportation: Private Auto Accompanied By: wife Schedule Follow-up Appointment: Yes Clinical Summary of Care: Electronic Signature(s) Signed: 01/03/2021 4:44:56 PM By: Dolan Amen RN Entered By: Dolan Amen on 01/03/2021 14:49:47 [name]  ([id number]) -------------------------------------------------------------------------------- Lower Extremity Assessment Details Patient Name: [name] Date of Service: 01/03/2021 2:00 PM Medical Record Number: [id number] Patient Account Number: [id number] Date of Birth/Sex: 1940-09-28 (79 y.o. M) Treating RN: Dolan Amen Primary Care Rivaldo Hineman: Elsie Stain Other Clinician: Referring Reshanda Lewey: Elsie Stain Treating [place]/Extender: Jeri Cos Weeks in Treatment: 2 Electronic Signature(s) Signed: 01/03/2021 4:44:56 PM By: Dolan Amen RN Entered By: Dolan Amen on 01/03/2021 14:21:56 [name] ([id number]) -------------------------------------------------------------------------------- Multi Wound Chart Details Patient Name: [name] Date of Service: 01/03/2021 2:00 PM Medical Record Number: [id number] Patient Account Number: [id number] Date of Birth/Sex: Mar 25, 1941 (79 y.o. M) Treating RN: Dolan Amen Primary Care Analena Gama: Elsie Stain Other Clinician: Referring Siham Bucaro: Elsie Stain Treating Prachi Oftedahl/Extender: Skipper Cliche in Treatment: 2 Vital Signs Height(in): 72 Pulse(bpm): 97 Weight(lbs): 254 Blood Pressure(mmHg): 137/82 Body Mass Index(BMI): 34 Temperature(F): 98.5 Respiratory Rate(breaths/min): 18 Photos: [N/A:N/A] Wound Location: Left Gluteus N/A N/A Wounding Event: Pressure Injury N/A N/A Primary Etiology: Pressure Ulcer N/A N/A Comorbid History: Glaucoma, Hypertension, Type II N/A N/A Diabetes, History of pressure wounds, Gout, Osteoarthritis Date Acquired: 12/21/2019 N/A N/A Weeks of Treatment: 2 N/A N/A Wound Status: Open N/A N/A Measurements L x W x D (cm) 0.5x0.5x0.1 N/A N/A Area (cm) : 0.196 N/A N/A Volume (cm) : 0.02 N/A N/A % Reduction in Area: 75.00% N/A N/A % Reduction in Volume: 74.70% N/A N/A Classification: Category/Stage III N/A N/A Exudate Amount: Medium N/A N/A Exudate Type: Serous N/A  N/A Exudate Color: amber N/A N/A Granulation Amount: Large (67-100%) N/A N/A Granulation Quality: Pink N/A N/A Necrotic Amount: None Present (0%) N/A N/A Exposed Structures: Fat Layer (Subcutaneous Tissue): N/A N/A Yes Fascia: No Tendon: No Muscle: No Joint: No Bone: No Epithelialization: Small (1-33%) N/A N/A Treatment Notes Electronic Signature(s) Signed: 01/03/2021 4:44:56 PM By: Dolan Amen RN Entered By: Dolan Amen on 01/03/2021 14:42:48 [name] ([id number]) -------------------------------------------------------------------------------- Multi-Disciplinary Care Plan Details Patient Name: [name] Date of Service: 01/03/2021 2:00 PM Medical Record Number: [id number] Patient  Account Number: [id number] Date of Birth/Sex: July 08, 1940 (79 y.o. M) Treating RN: Dolan Amen Primary Care Diedra Sinor: Elsie Stain Other Clinician: Referring Eugene Zeiders: Elsie Stain Treating Koralyn Prestage/Extender: Skipper Cliche in Treatment: 2 Active Inactive Pressure Nursing Diagnoses: Knowledge deficit related to causes and risk factors for pressure ulcer development Goals: Patient will remain free from development of additional pressure ulcers Date Initiated: 12/20/2020 Target Resolution Date: 01/19/2021 Goal Status: Active Patient will remain free of pressure ulcers Date Initiated: 12/20/2020 Target Resolution Date: 01/19/2021 Goal Status: Active Patient/caregiver will verbalize risk factors for pressure ulcer development Date Initiated: 12/20/2020 Target Resolution Date: 12/20/2020 Goal Status: Active Patient/caregiver will verbalize understanding of pressure ulcer management Date Initiated: 12/20/2020 Target Resolution Date: 12/20/2020 Goal Status: Active Interventions: Assess: immobility, friction, shearing, incontinence upon admission and as needed Assess offloading mechanisms upon admission and as needed Assess potential for pressure ulcer upon admission and as  needed Provide education on pressure ulcers Notes: Wound/Skin Impairment Nursing Diagnoses: Impaired tissue integrity Goals: Patient/caregiver will verbalize understanding of skin care regimen Date Initiated: 12/20/2020 Date Inactivated: 01/03/2021 Target Resolution Date: 12/20/2020 Goal Status: Met Ulcer/skin breakdown will have a volume reduction of 30% by week 4 Date Initiated: 12/20/2020 Target Resolution Date: 01/19/2021 Goal Status: Active Ulcer/skin breakdown will have a volume reduction of 50% by week 8 Date Initiated: 12/20/2020 Target Resolution Date: 02/19/2021 Goal Status: Active Ulcer/skin breakdown will have a volume reduction of 80% by week 12 Date Initiated: 12/20/2020 Target Resolution Date: 03/21/2021 Goal Status: Active Ulcer/skin breakdown will heal within 14 weeks Date Initiated: 12/20/2020 Target Resolution Date: 04/21/2021 Goal Status: Active Interventions: Assess patient/caregiver ability to obtain necessary supplies Assess patient/caregiver ability to perform ulcer/skin care regimen upon admission and as needed Assess ulceration(s) every visit SUFYAN, MEIDINGER ([id number]) Provide education on ulcer and skin care Treatment Activities: Referred to DME Kincaid Tiger for dressing supplies : 12/20/2020 Skin care regimen initiated : 12/20/2020 Notes: Electronic Signature(s) Signed: 01/03/2021 4:44:56 PM By: Dolan Amen RN Entered By: Dolan Amen on 01/03/2021 14:42:42 [name] ([id number]) -------------------------------------------------------------------------------- Pain Assessment Details Patient Name: [name] Date of Service: 01/03/2021 2:00 PM Medical Record Number: [id number] Patient Account Number: [id number] Date of Birth/Sex: [birth year] (79 y.o. M) Treating RN: Dolan Amen Primary Care Shanetha Bradham: Elsie Stain Other Clinician: Referring Saryn Cherry: Elsie Stain Treating Alyvia Derk/Extender: Jeri Cos Weeks in Treatment:  2 Active Problems Location of Pain Severity and Description of Pain Patient Has Paino No Site Locations Pain Management and Medication Current Pain Management: Electronic Signature(s) Signed: 01/03/2021 4:44:56 PM By: Dolan Amen RN Entered By: Dolan Amen on 01/03/2021 14:15:06 [name] ([id number]) -------------------------------------------------------------------------------- Patient/Caregiver Education Details Patient Name: [name] Date of Service: 01/03/2021 2:00 PM Medical Record Number: [id number] Patient Account Number: [id number] Date of Birth/Gender: 02-Oct-1940 (79 y.o. M) Treating RN: Dolan Amen Primary Care Physician: Elsie Stain Other Clinician: Referring Physician: Elsie Stain Treating Physician/Extender: Skipper Cliche in Treatment: 2 Education Assessment Education Provided To: Patient Education Topics Provided Wound/Skin Impairment: Methods: Explain/Verbal Responses: State content correctly Electronic Signature(s) Signed: 01/03/2021 4:44:56 PM By: Dolan Amen RN Entered By: Dolan Amen on 01/03/2021 14:48:20 [name] ([id number]) -------------------------------------------------------------------------------- Wound Assessment Details Patient Name: [name] Date of Service: 01/03/2021 2:00 PM Medical Record Number: [id number] Patient Account Number: [id number] Date of Birth/Sex: September 04, 1940 (79 y.o. M) Treating RN: Dolan Amen Primary Care Derika Eckles: Elsie Stain Other Clinician: Referring Phyliss Hulick: Elsie Stain Treating [place] Pacella/Extender: Jeri Cos Weeks in Treatment: 2 Wound Status Wound Number: 2 Primary Pressure Ulcer Etiology: Wound  Location: Left Gluteus Wound Open Wounding Event: Pressure Injury Status: Date Acquired: 12/21/2019 Comorbid Glaucoma, Hypertension, Type II Diabetes, History of Weeks Of Treatment: 2 History: pressure wounds, Gout, Osteoarthritis Clustered Wound:  No Photos Wound Measurements Length: (cm) 0.5 Width: (cm) 0.5 Depth: (cm) 0.1 Area: (cm) 0.196 Volume: (cm) 0.02 % Reduction in Area: 75% % Reduction in Volume: 74.7% Epithelialization: Small (1-33%) Tunneling: No Undermining: No Wound Description Classification: Category/Stage III Exudate Amount: Medium Exudate Type: Serous Exudate Color: amber Foul Odor After Cleansing: No Slough/Fibrino No Wound Bed Granulation Amount: Large (67-100%) Exposed Structure Granulation Quality: Pink Fascia Exposed: No Necrotic Amount: None Present (0%) Fat Layer (Subcutaneous Tissue) Exposed: Yes Tendon Exposed: No Muscle Exposed: No Joint Exposed: No Bone Exposed: No Treatment Notes Wound #2 (Gluteus) Wound Laterality: Left Cleanser Wound Cleanser Discharge Instruction: Wash your hands with soap and water. Remove old dressing, discard into plastic bag and place into trash. Cleanse the wound with Wound Cleanser prior to applying a clean dressing using gauze sponges, not tissues or cotton balls. Do not scrub or use excessive force. Pat dry using gauze sponges, not tissue or cotton balls. TYQWAN, PINK ([id number]) Peri-Wound Care Topical Primary Dressing Silvercel Small 2x2 (in/in) Discharge Instruction: Apply Silvercel Small 2x2 (in/in) as instructed Zetuvit Plus Silicone Border Dressing 4x4 (in/in) Discharge Instruction: Secure dressing Secondary Dressing Secured With Compression Wrap Compression Stockings Add-Ons Electronic Signature(s) Signed: 01/03/2021 4:44:56 PM By: Dolan Amen RN Entered By: Dolan Amen on 01/03/2021 14:19:38 [name] ([id number]) -------------------------------------------------------------------------------- Vitals Details Patient Name: [name] Date of Service: 01/03/2021 2:00 PM Medical Record Number: [id number] Patient Account Number: [id number] Date of Birth/Sex: Nov 23, 1940 (79 y.o. M) Treating RN: Dolan Amen Primary  Care Mckinley Olheiser: Elsie Stain Other Clinician: Referring Tarik Teixeira: Elsie Stain Treating Tymel Conely/Extender: Skipper Cliche in Treatment: 2 Vital Signs Time Taken: 14:13 Temperature (F): 98.5 Height (in): 72 Pulse (bpm): 97 Weight (lbs): 254 Respiratory Rate (breaths/min): 18 Body Mass Index (BMI): 34.4 Blood Pressure (mmHg): 137/82 Reference Range: 80 - 120 mg / dl Electronic Signature(s) Signed: 01/03/2021 4:44:56 PM By: Dolan Amen RN Entered By: Dolan Amen on 01/03/2021 14:14:48

## 2021-01-06 ENCOUNTER — Telehealth: Payer: Self-pay

## 2021-01-06 ENCOUNTER — Other Ambulatory Visit: Payer: Self-pay | Admitting: Family Medicine

## 2021-01-06 NOTE — Progress Notes (Addendum)
Chronic Care Management Pharmacy Assistant   Name: VINOD MIKESELL  MRN: [id number] DOB: January 13, 1941  Reason for Encounter: Diabetes Disease State   01/21/2021 - Called patient; left message.   01/14/2021 - Spoke with patient in regards to his [employer] monitor. Patient hasn't been taking his blood sugar because the machine does not work. Patient stated he will go tomorrow and buy a new machine. I counseled patient on checking his blood sugar daily and logging it. I informed patient I would follow up with him in a week to collect his numbers.   Recent office visits:  None since last CCM contact  Recent consult visits:  01/03/2021 [facility] - Patient presented for Left gluteus ulcer wound care.  12/20/2020 - [facility] - Patient presented for Left gluteus ulcer wound care.  12/11/2020 - [facility] - Patient presented stating that he has been concerned about the condition of the bottom of his left foot and is worried that he may have reoccurrence of lesion or other pathology and he cannot feel it due to his neuropathy. No medication changes.   Hospital visits:  None in previous 6 months  Medications: Outpatient Encounter Medications as of 01/06/2021  Medication Sig   allopurinol (ZYLOPRIM) 300 MG tablet Take 0.5 tablets (150 mg total) by mouth every morning.   amLODipine (NORVASC) 10 MG tablet TAKE 1 TABLET BY MOUTH DAILY   Ascorbic Acid (VITAMIN C WITH ROSE HIPS) 500 MG tablet Take 500 mg by mouth daily.   aspirin 81 MG tablet Take 81 mg by mouth daily.   atorvastatin (LIPITOR) 10 MG tablet TAKE 1 TABLET BY MOUTH EACH EVENING   CINNAMON PO Take 200 mg by mouth daily.   Continuous Blood Gluc Receiver (FREESTYLE LIBRE 2 READER) DEVI USE TO CHECK BLOOD SUGAR DAILY   Continuous Blood Gluc Sensor (FREESTYLE LIBRE 2 SENSOR) MISC Use to check blood sugar daily. Dx E11.9   dapagliflozin propanediol (FARXIGA) 10 MG TABS tablet Take 1 tablet (10 mg  total) by mouth daily before breakfast.   diclofenac sodium (VOLTAREN) 1 % GEL Uses PRN   dorzolamide-timolol (COSOPT) 22.3-6.8 MG/ML ophthalmic solution Place 1 drop into the left eye 2 (two) times daily.    Dulaglutide (TRULICITY) 3 GH/8.2XH SOPN Inject 3 mg as directed once a week.   gemfibrozil (LOPID) 600 MG tablet Take 1 tablet (600 mg total) by mouth 2 (two) times daily.   insulin glargine, 2 Unit Dial, (TOUJEO MAX SOLOSTAR) 300 UNIT/ML Solostar Pen 175 units injected in the AM   Insulin Pen Needle (B-D UF III MINI PEN NEEDLES) 31G X 5 MM MISC 1 each by Does not apply route daily. Use to administer insulin daily   latanoprost (XALATAN) 0.005 % ophthalmic solution Place 1 drop into both eyes at bedtime.   losartan (COZAAR) 100 MG tablet TAKE 1 TABLET BY MOUTH DAILY   meloxicam (MOBIC) 15 MG tablet Take 0.5-1 tablets (7.5-15 mg total) by mouth daily as needed for pain (with food).   methylcellulose (CITRUCEL) oral powder Take 1 packet by mouth daily.   metoprolol succinate (TOPROL-XL) 25 MG 24 hr tablet TAKE 1 TABLET BY MOUTH DAILY   Multiple Vitamins-Minerals (VITRUM 50+ SENIOR MULTI PO) Take 1 tablet by mouth daily.    mupirocin ointment (BACTROBAN) 2 % Apply 1 application topically 2 (two) times daily.   Tadalafil 2.5 MG TABS TAKE 1 TABLET BY MOUTH DAILY   triamcinolone cream (KENALOG) 0.1 % Apply 1  application topically 2 (two) times daily as needed.   Vibegron (GEMTESA) 75 MG TABS Take 75 mg by mouth at bedtime.   No facility-administered encounter medications on file as of 01/06/2021.   Recent Relevant Labs: Lab Results  Component Value Date/Time   HGBA1C 10.0 (A) 10/10/2020 09:24 AM   HGBA1C 10.5 (A) 07/11/2020 09:13 AM   HGBA1C 9.9 (H) 07/17/2019 10:28 AM   HGBA1C 9.6 (H) 07/15/2018 09:35 AM    Kidney Function Lab Results  Component Value Date/Time   CREATININE 1.37 12/13/2020 10:46 AM   CREATININE 1.40 10/10/2020 09:51 AM   CREATININE 1.44 12/21/2014 12:00 AM    CREATININE 1.32 05/09/2014 12:00 AM   GFR 48.97 (L) 12/13/2020 10:46 AM   GFRNONAA 40 (L) 12/12/2019 03:55 PM   GFRAA 47 (L) 12/12/2019 03:55 PM    Contacted patient on 01/06/2021 to discuss diabetes disease state.   Current antihyperglycemic regimen:  Farxiga 10 mg - 1 tablet daily  Toujeo Max Solostar 300 unit/mL - 170 units every morning  Trulicity 3 mg - Inject once weekly (Sunday)   Patient verbally confirms he is taking the above medications as directed. Yes  What diet changes have been made to improve diabetes control? Patient stated he is not eating correct foods or eating well so his A1c is not good. He realizes he needs to change his diet.   What recent interventions/DTPs have been made to improve glycemic control:   Per PCP, increase insulin by 5 units until fasting BG < 200.   Have there been any recent hospitalizations or ED visits since last visit with CPP? No  Patient denies hypoglycemic symptoms, including Pale, Sweaty, Shaky, Hungry, Nervous/irritable, and Vision changes  Patient denies hyperglycemic symptoms, including blurry vision, excessive thirst, fatigue, polyuria, and weakness  How often are you checking your blood sugar? Patient has not checked his blood sugar in over a week. The last time he remembers it was 150 when he took it non-fasting in the afternoon.  Counseled patient on taking his blood pressure daily; first thing in the morning before breakfast.   During the week, how often does your blood glucose drop below 70? Never  Are you checking your feet daily/regularly? Yes  Adherence Review: Is the patient currently on a STATIN medication? Yes Is the patient currently on ACE/ARB medication? Yes Does the patient have >5 day gap between last estimated fill dates? No  Care Gaps: Annual wellness visit in last year? No 04/22/2016 Most recent A1C reading: 10.0 on 10/10/2020 Most Recent BP reading: 130/78 on 10/30/2020  Last eye exam / retinopathy  screening: 01/01/2020 Last diabetic foot exam: 12/28/2019  Counseled patient on importance of annual eye and foot exam.   Star Rating Drugs: Medication:  Last Fill: Day Supply Farxiga 10 mg             12/17/2020 30 Toujeo max solostar   12/20/2020 30 Trulicity 3 mg               Patient assistance Atorvastatin 10mg       10/21/2020 90 Losartan 100mg          09 /17/2022 90  Wound care appointment on 01/24/2021 PCP appointment on 11/01/202  Debbora Dus, CPP notified  Marijean Niemann, [place] Assistant [phone]  Patient has freestyle Elenor Legato so he should be able to track sugars throughout the day. If BG remains elevated, consider increase Trulicity to 4.5 mg  and if necessary, start mealtime insulin. I have reviewed  the care management and care coordination activities outlined in this encounter and I am certifying that I agree with the content of this note. No further action required.  Debbora Dus, PharmD Clinical Pharmacist [facility] [phone]

## 2021-01-24 ENCOUNTER — Other Ambulatory Visit: Payer: Self-pay

## 2021-01-24 ENCOUNTER — Encounter: Payer: HMO | Admitting: Physician Assistant

## 2021-01-24 DIAGNOSIS — L89323 Pressure ulcer of left buttock, stage 3: Secondary | ICD-10-CM | POA: Diagnosis not present

## 2021-01-24 NOTE — Progress Notes (Addendum)
[name], [name] ([id number]) Visit Report for 01/24/2021 Chief Complaint Document Details Patient Name: [name], [name] Date of Service: 01/24/2021 10:45 AM Medical Record Number: [id number] Patient Account Number: [id number] Date of Birth/Sex: [birth year] (79 y.o. M) Treating RN: Cornell Barman Primary Care Provider: Elsie Stain Other Clinician: Referring Provider: Elsie Stain Treating Provider/Extender: Skipper Cliche in Treatment: 5 Information Obtained from: Patient Chief Complaint Left gluteal pressure ulcer Electronic Signature(s) Signed: 01/24/2021 11:00:51 AM By: Worthy Keeler PA-C Entered By: Worthy Keeler on 01/24/2021 11:00:51 [name] ([id number]) -------------------------------------------------------------------------------- HPI Details Patient Name: [name] Date of Service: 01/24/2021 10:45 AM Medical Record Number: [id number] Patient Account Number: [id number] Date of Birth/Sex: 29-May-1940 (79 y.o. M) Treating RN: Cornell Barman Primary Care Provider: Elsie Stain Other Clinician: Referring Provider: Elsie Stain Treating Provider/Extender: Skipper Cliche in Treatment: 5 History of Present Illness HPI Description: 05/30/2019 upon evaluation today patient appears for initial evaluation in our clinic concerning issues that he has been having on the left gluteal region due to a pressure ulcer unfortunately. He does have a lot of weakness as well as issues with hip pain that causes him to not be able to sleep in a bed that has been quite a problem for him unfortunately. 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 Nonetheless this has led to chronic pain in the right hip. The patient is slow-moving but is able to ambulate today. He does typically sleep in his lift chair. He does not really move  around as much as he probably should although he can and I think this is one area where I am going to suggest that we go ahead and have him get up and move around more frequently to try to help with offloading I discussed how this can be beneficial. The good news is his wounds do not seem to be too deep. No fever chills noted 06/05/2019 upon evaluation today patient's wound is showing some signs of slight improvement which is good news. With that being said there is unfortunately no evidence of significant overall improvement which is the one issue that we are seeing at this time with regard to how much she has been sitting. His family member tells me that he sat pretty much all day yesterday the patient states he got up once to go outside but again for the most part he does not seem to be moving around much at all. 06/19/2019 upon evaluation today patient appears to be doing excellent in regard to his wound. In fact this has made great progress despite the fact that he apparently had food poisoning with diarrhea over the weekend when [place] was at the beach. This is indeed unfortunate and to be honest the only good thing about this is that I did discontinue doing the dressing and actually switched over to just using gentamicin cream applied to the wound bed followed by a good coating of Desitin which seems to have done excellent for him. There is no signs of active infection at this time. No fevers, chills, nausea, vomiting, or diarrhea. Readmission: 12/20/2020 upon evaluation today patient appears to be doing somewhat poorly in regard to the left gluteal region this is actually the same area that I previously took care of as well when he was here back in March 2021. Unfortunately since that time he is continue  to have issues with intermittent opening and honestly it really has not seen just go away completely. Fortunately there does not appear to be any signs of active infection at this time which is great  news. He has been using gentamicin cream they did use some collagen previously but its been a little while since they did that. That was left over from the previous time I saw the patient. With that being said he does have a chair at the sleep center he has spinal stenosis so is not able to sleep in the bed and lay flat or even on his side for that matter. That is one of the big complicating factors here. The patient again still does have issues with hypertension, diabetes mellitus type 2, spinal stenosis, and generalized muscle weakness. 01/03/2021 upon evaluation today patient's wound is actually showing signs of being somewhat open in regard to his gluteal region although it seems to be doing much better than previous. Fortunately there does not appear to be any signs of active infection which is great news. No fevers, chills, nausea, vomiting, or diarrhea. Overall I am extremely pleased with where we stand currently. 01/24/2021 upon evaluation today patient appears to be doing well with regard to the wound in the gluteal region. Fortunately there does not appear to be any signs of active infection at this time and in fact this actually appears to be healed based on what I am seeing currently. Overall I am very pleased with where things stand and I think he is done much better getting up and standing to give the tissue some breathing room and ability to restore some of the blood flow in between times that he sitting and more prolonged episodes. Electronic Signature(s) Signed: 01/24/2021 6:45:34 PM By: Worthy Keeler PA-C Entered By: Worthy Keeler on 01/24/2021 18:45:33 [name] ([id number]) -------------------------------------------------------------------------------- Physical Exam Details Patient Name: [name] Date of Service: 01/24/2021 10:45 AM Medical Record Number: [id number] Patient Account Number: [id number] Date of Birth/Sex: 1941/01/31 (79 y.o. M) Treating RN:  Cornell Barman Primary Care Provider: Elsie Stain Other Clinician: Referring Provider: Elsie Stain Treating Provider/Extender: Jeri Cos Weeks in Treatment: 5 Constitutional Well-nourished and well-hydrated in no acute distress. Respiratory normal breathing without difficulty. Psychiatric this patient is able to make decisions and demonstrates good insight into disease process. Alert and Oriented x 3. pleasant and cooperative. Notes Upon inspection patient's wound bed again showed signs of pretty much complete resolution I did not see any obvious and significant open wound although I think that the biggest thing right now is good to be for [place] to probably try to just keep this area intact and I think AandE ointment would do the best thing for him in that regard. Other than that I think him getting up and moving around once an hour to keep the blood flowing and still the best way to manage things here. Electronic Signature(s) Signed: 01/24/2021 6:45:58 PM By: Worthy Keeler PA-C Entered By: Worthy Keeler on 01/24/2021 18:45:58 [name] ([id number]) -------------------------------------------------------------------------------- Physician Orders Details Patient Name: [name] Date of Service: 01/24/2021 10:45 AM Medical Record Number: [id number] Patient Account Number: [id number] Date of Birth/Sex: 1940/11/23 (79 y.o. M) Treating RN: Cornell Barman Primary Care Provider: Elsie Stain Other Clinician: Referring Provider: Elsie Stain Treating Provider/Extender: Skipper Cliche in Treatment: 5 Verbal / Phone Orders: No Diagnosis Coding ICD-10 Coding Code Description [phone] Pressure ulcer of left buttock, stage 3 I10 Essential (primary) hypertension E11.622  Type 2 diabetes mellitus with other skin ulcer M62.81 Muscle weakness (generalized) M48.07 Spinal stenosis, lumbosacral region Discharge From [facility] Services o Discharge from [facility]  Complete - Use AandD ointment to the dry skin daily Electronic Signature(s) Signed: 01/24/2021 7:05:42 PM By: Worthy Keeler PA-C Signed: 01/27/2021 3:17:27 PM By: Gretta Cool, BSN, RN, CWS, Kim RN, BSN Entered By: Gretta Cool, BSN, RN, CWS, Kim on 01/24/2021 11:08:53 [name] ([id number]) -------------------------------------------------------------------------------- Problem List Details Patient Name: [name] Date of Service: 01/24/2021 10:45 AM Medical Record Number: [id number] Patient Account Number: [id number] Date of Birth/Sex: 1941-03-10 (79 y.o. M) Treating RN: Cornell Barman Primary Care Provider: Elsie Stain Other Clinician: Referring Provider: Elsie Stain Treating Provider/Extender: Jeri Cos Weeks in Treatment: 5 Active Problems ICD-10 Encounter Code Description Active Date MDM Diagnosis [phone] Pressure ulcer of left buttock, stage 3 12/20/2020 No Yes I10 Essential (primary) hypertension 12/20/2020 No Yes E11.622 Type 2 diabetes mellitus with other skin ulcer 12/20/2020 No Yes M62.81 Muscle weakness (generalized) 12/20/2020 No Yes M48.07 Spinal stenosis, lumbosacral region 12/20/2020 No Yes Inactive Problems Resolved Problems Electronic Signature(s) Signed: 01/24/2021 11:00:43 AM By: Worthy Keeler PA-C Entered By: Worthy Keeler on 01/24/2021 11:00:43 [name] ([id number]) -------------------------------------------------------------------------------- Progress Note Details Patient Name: [name] Date of Service: 01/24/2021 10:45 AM Medical Record Number: [id number] Patient Account Number: [id number] Date of Birth/Sex: [birth year] (79 y.o. M) Treating RN: Cornell Barman Primary Care Provider: Elsie Stain Other Clinician: Referring Provider: Elsie Stain Treating Provider/Extender: Skipper Cliche in Treatment: 5 Subjective Chief Complaint Information obtained from Patient Left gluteal pressure ulcer History of Present Illness  (HPI) 05/30/2019 upon evaluation today patient appears for initial evaluation in our clinic concerning issues that he has been having on the left gluteal region due to a pressure ulcer unfortunately. He does have a lot of weakness as well as issues with hip pain that causes him to not be able to sleep in a bed that has been quite a problem for him unfortunately. 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 Nonetheless this has led to chronic pain in the right hip. The patient is slow-moving but is able to ambulate today. He does typically sleep in his lift chair. He does not really move around as much as he probably should although he can and I think this is one area where I am going to suggest that we go ahead and have him get up and move around more frequently to try to help with offloading I discussed how this can be beneficial. The good news is his wounds do not seem to be too deep. No fever chills noted 06/05/2019 upon evaluation today patient's wound is showing some signs of slight improvement which is good news. With that being said there is unfortunately no evidence of significant overall improvement which is the one issue that we are seeing at this time with regard to how much she has been sitting. His family member tells me that he sat pretty much all day yesterday the patient states he got up once to go outside but again for the most part he does not seem to be moving around much at all. 06/19/2019 upon evaluation today patient appears to be doing excellent in regard to his wound. In fact this has made great progress despite the fact that he apparently had food  poisoning with diarrhea over the weekend when he was at the beach. This is indeed unfortunate and to be honest the only good thing about this is that I did discontinue doing the dressing  and actually switched over to just using gentamicin cream applied to the wound bed followed by a good coating of Desitin which seems to have done excellent for him. There is no signs of active infection at this time. No fevers, chills, nausea, vomiting, or diarrhea. Readmission: 12/20/2020 upon evaluation today patient appears to be doing somewhat poorly in regard to the left gluteal region this is actually the same area that I previously took care of as well when he was here back in March 2021. Unfortunately since that time he is continue to have issues with intermittent opening and honestly it really has not seen just go away completely. Fortunately there does not appear to be any signs of active infection at this time which is great news. He has been using gentamicin cream they did use some collagen previously but its been a little while since they did that. That was left over from the previous time I saw the patient. With that being said he does have a chair at the sleep center he has spinal stenosis so is not able to sleep in the bed and lay flat or even on his side for that matter. That is one of the big complicating factors here. The patient again still does have issues with hypertension, diabetes mellitus type 2, spinal stenosis, and generalized muscle weakness. 01/03/2021 upon evaluation today patient's wound is actually showing signs of being somewhat open in regard to his gluteal region although it seems to be doing much better than previous. Fortunately there does not appear to be any signs of active infection which is great news. No fevers, chills, nausea, vomiting, or diarrhea. Overall I am extremely pleased with where we stand currently. 01/24/2021 upon evaluation today patient appears to be doing well with regard to the wound in the gluteal region. Fortunately there does not appear to be any signs of active infection at this time and in fact this actually appears to be healed based on  what I am seeing currently. Overall I am very pleased with where things stand and I think he is done much better getting up and standing to give the tissue some breathing room and ability to restore some of the blood flow in between times that he sitting and more prolonged episodes. Objective Constitutional Well-nourished and well-hydrated in no acute distress. Vitals Time Taken: 10:43 AM, Height: 72 in, Weight: 254 lbs, BMI: 34.4, Temperature: 98.6 F, Pulse: 88 bpm, Respiratory Rate: 18 breaths/min, Blood Pressure: 142/88 mmHg. [name], Ronnie N. ([id number]) Respiratory normal breathing without difficulty. Psychiatric this patient is able to make decisions and demonstrates good insight into disease process. Alert and Oriented x 3. pleasant and cooperative. General Notes: Upon inspection patient's wound bed again showed signs of pretty much complete resolution I did not see any obvious and significant open wound although I think that the biggest thing right now is good to be for [place] to probably try to just keep this area intact and I think AandE ointment would do the best thing for him in that regard. Other than that I think him getting up and moving around once an hour to keep the blood flowing and still the best way to manage things here. Integumentary (Hair, Skin) Wound #2 status is Healed - Epithelialized. Original cause of wound was  Pressure Injury. The date acquired was: 12/21/2019. The wound has been in treatment 5 weeks. The wound is located on the Left Gluteus. The wound measures 0cm length x 0cm width x 0cm depth; 0cm^2 area and 0cm^3 volume. There is Fat Layer (Subcutaneous Tissue) exposed. There is a medium amount of serous drainage noted. There is large (67- 100%) pink granulation within the wound bed. There is no necrotic tissue within the wound bed. Assessment Active Problems ICD-10 Pressure ulcer of left buttock, stage 3 Essential (primary) hypertension Type 2 diabetes  mellitus with other skin ulcer Muscle weakness (generalized) Spinal stenosis, lumbosacral region Plan Discharge From [facility] Services: Discharge from [facility] Complete - Use AandD ointment to the dry skin daily 1. Would recommend currently that we going to continue with the recommendation for AandE ointment at home I do not think he needs to continue to come to the wound care center as long as things are showing signs of improvement and nothing reopens then I think he should be good to go. 2. If anything does change or worsen he should let me know soon as possible. We will see patient back for reevaluation in 1 week here in the clinic. If anything worsens or changes patient will contact our office for additional recommendations. Electronic Signature(s) Signed: 01/24/2021 6:46:23 PM By: Worthy Keeler PA-C Entered By: Worthy Keeler on 01/24/2021 18:46:22 [name] ([id number]) -------------------------------------------------------------------------------- SuperBill Details Patient Name: [name] Date of Service: 01/24/2021 Medical Record Number: [id number] Patient Account Number: [id number] Date of Birth/Sex: [birth year] (79 y.o. M) Treating RN: Cornell Barman Primary Care Provider: Elsie Stain Other Clinician: Referring Provider: Elsie Stain Treating Provider/Extender: Skipper Cliche in Treatment: 5 Diagnosis Coding ICD-10 Codes Code Description [phone] Pressure ulcer of left buttock, stage 3 I10 Essential (primary) hypertension E11.622 Type 2 diabetes mellitus with other skin ulcer M62.81 Muscle weakness (generalized) M48.07 Spinal stenosis, lumbosacral region Facility Procedures CPT4 Code: [id number] Description: [phone] - WOUND CARE VISIT-LEV 2 EST PT Modifier: Quantity: 1 Physician Procedures CPT4 Code: [id number] Description: [id number] - WC PHYS LEVEL 3 - EST PT Modifier: Quantity: 1 CPT4 Code: Description: ICD-10 Diagnosis Description  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, stage 3 I10 Essential (primary) hypertension E11.622 Type 2 diabetes mellitus with other skin ulcer M62.81 Muscle weakness (generalized) Modifier: Quantity: Electronic Signature(s) Signed: 01/24/2021 6:46:41 PM By: Worthy Keeler PA-C Entered By: Worthy Keeler on 01/24/2021 18:46:41

## 2021-01-27 NOTE — Progress Notes (Signed)
[name], [name] ([id number]) Visit Report for 01/24/2021 Arrival Information Details Patient Name: [name], [name] Date of Service: 01/24/2021 10:45 AM Medical Record Number: [id number] Patient Account Number: [id number] Date of Birth/Sex: Jan 17, 1941 (80 y.o. M) Treating RN: Cornell Barman Primary Care Yehonatan Grandison: Elsie Stain Other Clinician: Referring Asyria Kolander: Elsie Stain Treating Kaitlin Alcindor/Extender: Skipper Cliche in Treatment: 5 Visit Information History Since Last Visit Has Dressing in Place as Prescribed: No Patient Arrived: Ambulatory Pain Present Now: No Arrival Time: 10:41 Accompanied By: wife Transfer Assistance: None Patient Identification Verified: Yes Secondary Verification Process Completed: Yes Electronic Signature(s) Signed: 01/27/2021 3:17:27 PM By: Gretta Cool, BSN, RN, CWS, Kim RN, BSN Entered By: Gretta Cool, BSN, RN, CWS, Kim on 01/24/2021 10:42:17 [name] ([id number]) -------------------------------------------------------------------------------- Clinic Level of Care Assessment Details Patient Name: [name] Date of Service: 01/24/2021 10:45 AM Medical Record Number: [id number] Patient Account Number: [id number] Date of Birth/Sex: 1940/07/10 (80 y.o. M) Treating RN: Cornell Barman Primary Care Vonita Calloway: Elsie Stain Other Clinician: Referring Esraa Seres: Elsie Stain Treating Meiko Stranahan/Extender: Skipper Cliche in Treatment: 5 Clinic Level of Care Assessment Items TOOL 4 Quantity Score []  - Use when only an EandM is performed on FOLLOW-UP visit 0 ASSESSMENTS - Nursing Assessment / Reassessment X - Reassessment of Co-morbidities (includes updates in patient status) 1 10 X- 1 5 Reassessment of Adherence to Treatment Plan ASSESSMENTS - Wound and Skin Assessment / Reassessment X - Simple Wound Assessment / Reassessment - one wound 1 5 []  - 0 Complex Wound Assessment / Reassessment - multiple wounds []  - 0 Dermatologic / Skin Assessment (not  related to wound area) ASSESSMENTS - Focused Assessment []  - Circumferential Edema Measurements - multi extremities 0 []  - 0 Nutritional Assessment / Counseling / Intervention []  - 0 Lower Extremity Assessment (monofilament, tuning fork, pulses) []  - 0 Peripheral Arterial Disease Assessment (using hand held doppler) ASSESSMENTS - Ostomy and/or Continence Assessment and Care []  - Incontinence Assessment and Management 0 []  - 0 Ostomy Care Assessment and Management (repouching, etc.) PROCESS - Coordination of Care X - Simple Patient / Family Education for ongoing care 1 15 []  - 0 Complex (extensive) Patient / Family Education for ongoing care []  - 0 Staff obtains Programmer, systems, Records, Test Results / Process Orders []  - 0 Staff telephones HHA, Nursing Homes / Clarify orders / etc []  - 0 Routine Transfer to another Facility (non-emergent condition) []  - 0 Routine Hospital Admission (non-emergent condition) []  - 0 New Admissions / Biomedical engineer / Ordering NPWT, Apligraf, etc. []  - 0 Emergency Hospital Admission (emergent condition) X- 1 10 Simple Discharge Coordination []  - 0 Complex (extensive) Discharge Coordination PROCESS - Special Needs []  - Pediatric / Minor Patient Management 0 []  - 0 Isolation Patient Management []  - 0 Hearing / Language / Visual special needs []  - 0 Assessment of Community assistance (transportation, D/C planning, etc.) []  - 0 Additional assistance / Altered mentation []  - 0 Support Surface(s) Assessment (bed, cushion, seat, etc.) INTERVENTIONS - Wound Cleansing / Measurement [name], Ronnie N. ([id number]) []  - 0 Simple Wound Cleansing - one wound []  - 0 Complex Wound Cleansing - multiple wounds X- 1 5 Wound Imaging (photographs - any number of wounds) []  - 0 Wound Tracing (instead of photographs) []  - 0 Simple Wound Measurement - one wound []  - 0 Complex Wound Measurement - multiple wounds INTERVENTIONS - Wound Dressings []  -  Small Wound Dressing one or multiple wounds 0 []  - 0 Medium Wound Dressing one or multiple wounds []  - 0  Large Wound Dressing one or multiple wounds []  - 0 Application of Medications - topical []  - 0 Application of Medications - injection INTERVENTIONS - Miscellaneous []  - External ear exam 0 []  - 0 Specimen Collection (cultures, biopsies, blood, body fluids, etc.) []  - 0 Specimen(s) / Culture(s) sent or taken to Lab for analysis []  - 0 Patient Transfer (multiple staff / Civil Service fast streamer / Similar devices) []  - 0 Simple Staple / Suture removal (25 or less) []  - 0 Complex Staple / Suture removal (26 or more) []  - 0 Hypo / Hyperglycemic Management (close monitor of Blood Glucose) []  - 0 Ankle / Brachial Index (ABI) - do not check if billed separately X- 1 5 Vital Signs Has the patient been seen at the hospital within the last three years: Yes Total Score: 55 Level Of Care: New/Established - Level 2 Electronic Signature(s) Signed: 01/27/2021 3:17:27 PM By: Gretta Cool, BSN, RN, CWS, Kim RN, BSN Entered By: Gretta Cool, BSN, RN, CWS, Kim on 01/24/2021 11:09:14 [name] ([id number]) -------------------------------------------------------------------------------- Encounter Discharge Information Details Patient Name: [name] Date of Service: 01/24/2021 10:45 AM Medical Record Number: [id number] Patient Account Number: [id number] Date of Birth/Sex: 09/19/1940 (80 y.o. M) Treating RN: Cornell Barman Primary Care Varshini Arrants: Elsie Stain Other Clinician: Referring Trianna Lupien: Elsie Stain Treating Lenee Franze/Extender: Skipper Cliche in Treatment: 5 Encounter Discharge Information Items Discharge Condition: Stable Ambulatory Status: Cane Discharge Destination: Home Transportation: Private Auto Accompanied By: wife Schedule Follow-up Appointment: No Clinical Summary of Care: Electronic Signature(s) Signed: 01/27/2021 3:17:27 PM By: Gretta Cool, BSN, RN, CWS, Kim RN, BSN Entered By:  Gretta Cool, BSN, RN, CWS, Kim on 01/24/2021 11:10:13 [name] ([id number]) -------------------------------------------------------------------------------- Lower Extremity Assessment Details Patient Name: [name] Date of Service: 01/24/2021 10:45 AM Medical Record Number: [id number] Patient Account Number: [id number] Date of Birth/Sex: 11-26-1940 (79 y.o. M) Treating RN: Cornell Barman Primary Care Amman Bartel: Elsie Stain Other Clinician: Referring Lacreshia Bondarenko: Elsie Stain Treating Arbutus Nelligan/Extender: Skipper Cliche in Treatment: 5 Electronic Signature(s) Signed: 01/27/2021 3:17:27 PM By: Gretta Cool, BSN, RN, CWS, Kim RN, BSN Entered By: Gretta Cool, BSN, RN, CWS, Kim on 01/24/2021 10:48:45 [name] ([id number]) -------------------------------------------------------------------------------- Multi Wound Chart Details Patient Name: [name] Date of Service: 01/24/2021 10:45 AM Medical Record Number: [id number] Patient Account Number: [id number] Date of Birth/Sex: [birth year] (79 y.o. M) Treating RN: Cornell Barman Primary Care Danese Dorsainvil: Elsie Stain Other Clinician: Referring Briseis Aguilera: Elsie Stain Treating Taishaun Levels/Extender: Skipper Cliche in Treatment: 5 Vital Signs Height(in): 72 Pulse(bpm): 63 Weight(lbs): 44 Blood Pressure(mmHg): 142/88 Body Mass Index(BMI): 34 Temperature(F): 98.6 Respiratory Rate(breaths/min): 18 Photos: [N/A:N/A] Wound Location: Left Gluteus N/A N/A Wounding Event: Pressure Injury N/A N/A Primary Etiology: Pressure Ulcer N/A N/A Comorbid History: Glaucoma, Hypertension, Type II N/A N/A Diabetes, History of pressure wounds, Gout, Osteoarthritis Date Acquired: 12/21/2019 N/A N/A Weeks of Treatment: 5 N/A N/A Wound Status: Healed - Epithelialized N/A N/A Measurements L x W x D (cm) 0x0x0 N/A N/A Area (cm) : 0 N/A N/A Volume (cm) : 0 N/A N/A % Reduction in Area: 100.00% N/A N/A % Reduction in Volume: 100.00% N/A  N/A Classification: Category/Stage III N/A N/A Exudate Amount: Medium N/A N/A Exudate Type: Serous N/A N/A Exudate Color: amber N/A N/A Granulation Amount: Large (67-100%) N/A N/A Granulation Quality: Pink N/A N/A Necrotic Amount: None Present (0%) N/A N/A Exposed Structures: Fat Layer (Subcutaneous Tissue): N/A N/A Yes Fascia: No Tendon: No Muscle: No Joint: No Bone: No Epithelialization: Small (1-33%) N/A N/A Treatment Notes Electronic Signature(s) Signed: 01/27/2021 3:17:27  PM By: Gretta Cool, BSN, RN, CWS, Kim RN, BSN Entered By: Gretta Cool, BSN, RN, CWS, Kim on 01/24/2021 11:07:49 [name] ([id number]) -------------------------------------------------------------------------------- [place] Details Patient Name: [name] Date of Service: 01/24/2021 10:45 AM Medical Record Number: [id number] Patient Account Number: [id number] Date of Birth/Sex: November 25, 1940 (79 y.o. M) Treating RN: Cornell Barman Primary Care Elida Harbin: Elsie Stain Other Clinician: Referring Lonnell Chaput: Elsie Stain Treating Ursala Cressy/Extender: Skipper Cliche in Treatment: 5 Active Inactive Electronic Signature(s) Signed: 01/27/2021 3:17:27 PM By: Gretta Cool, BSN, RN, CWS, Kim RN, BSN Entered By: Gretta Cool, BSN, RN, CWS, Kim on 01/24/2021 11:07:36 [name] ([id number]) -------------------------------------------------------------------------------- Pain Assessment Details Patient Name: [name] Date of Service: 01/24/2021 10:45 AM Medical Record Number: [id number] Patient Account Number: [id number] Date of Birth/Sex: 11/18/1940 (79 y.o. M) Treating RN: Cornell Barman Primary Care Rebecka Oelkers: Elsie Stain Other Clinician: Referring Isai Gottlieb: Elsie Stain Treating Alejo Beamer/Extender: Skipper Cliche in Treatment: 5 Active Problems Location of Pain Severity and Description of Pain Patient Has Paino No Site Locations Pain Management and Medication Current Pain  Management: Notes Patient denies pain at this time. Electronic Signature(s) Signed: 01/27/2021 3:17:27 PM By: Gretta Cool, BSN, RN, CWS, Kim RN, BSN Entered By: Gretta Cool, BSN, RN, CWS, Kim on 01/24/2021 10:44:23 [name] ([id number]) -------------------------------------------------------------------------------- Patient/Caregiver Education Details Patient Name: [name] Date of Service: 01/24/2021 10:45 AM Medical Record Number: [id number] Patient Account Number: [id number] Date of Birth/Gender: 1941-01-16 (79 y.o. M) Treating RN: Cornell Barman Primary Care Physician: Elsie Stain Other Clinician: Referring Physician: Elsie Stain Treating Physician/Extender: Skipper Cliche in Treatment: 5 Education Assessment Education Provided To: Patient Education Topics Provided Pressure: Handouts: Preventing Pressure Ulcers Methods: Demonstration, Explain/Verbal Responses: State content correctly Electronic Signature(s) Signed: 01/27/2021 3:17:27 PM By: Gretta Cool, BSN, RN, CWS, Kim RN, BSN Entered By: Gretta Cool, BSN, RN, CWS, Kim on 01/24/2021 11:09:35 [name] ([id number]) -------------------------------------------------------------------------------- Wound Assessment Details Patient Name: [name] Date of Service: 01/24/2021 10:45 AM Medical Record Number: [id number] Patient Account Number: [id number] Date of Birth/Sex: Aug 20, 1940 (79 y.o. M) Treating RN: Cornell Barman Primary Care Charmine Bockrath: Elsie Stain Other Clinician: Referring Adamary Savary: Elsie Stain Treating Marka Treloar/Extender: Jeri Cos Weeks in Treatment: 5 Wound Status Wound Number: 2 Primary Pressure Ulcer Etiology: Wound Location: Left Gluteus Wound Healed - Epithelialized Wounding Event: Pressure Injury Status: Date Acquired: 12/21/2019 Comorbid Glaucoma, Hypertension, Type II Diabetes, History of Weeks Of Treatment: 5 History: pressure wounds, Gout, Osteoarthritis Clustered Wound:  No Photos Wound Measurements Length: (cm) 0 % Width: (cm) 0 % Depth: (cm) 0 Ep Area: (cm) 0 Volume: (cm) 0 Reduction in Area: 100% Reduction in Volume: 100% ithelialization: Small (1-33%) Wound Description Classification: Category/Stage III F Exudate Amount: Medium S Exudate Type: Serous Exudate Color: amber oul Odor After Cleansing: No lough/Fibrino No Wound Bed Granulation Amount: Large (67-100%) Exposed Structure Granulation Quality: Pink Fascia Exposed: No Necrotic Amount: None Present (0%) Fat Layer (Subcutaneous Tissue) Exposed: Yes Tendon Exposed: No Muscle Exposed: No Joint Exposed: No Bone Exposed: No Treatment Notes Wound #2 (Gluteus) Wound Laterality: Left Cleanser Peri-Wound Care Topical Primary Dressing [name], [name] ([id number]) Secondary Dressing Secured With Compression Wrap Compression Stockings Add-Ons Electronic Signature(s) Signed: 01/27/2021 3:17:27 PM By: Gretta Cool, BSN, RN, CWS, Kim RN, BSN Entered By: Gretta Cool, BSN, RN, CWS, Kim on 01/24/2021 11:06:05 [name] ([id number]) -------------------------------------------------------------------------------- Vitals Details Patient Name: [name] Date of Service: 01/24/2021 10:45 AM Medical Record Number: [id number] Patient Account Number: [id number] Date of Birth/Sex: October 02, 1940 (79 y.o. M) Treating RN: Cornell Barman  Primary Care Hermione Havlicek: Elsie Stain Other Clinician: Referring Meela Wareing: Elsie Stain Treating Shaylene Paganelli/Extender: Skipper Cliche in Treatment: 5 Vital Signs Time Taken: 10:43 Temperature (F): 98.6 Height (in): 72 Pulse (bpm): 88 Weight (lbs): 254 Respiratory Rate (breaths/min): 18 Body Mass Index (BMI): 34.4 Blood Pressure (mmHg): 142/88 Reference Range: 80 - 120 mg / dl Electronic Signature(s) Signed: 01/27/2021 3:17:27 PM By: Gretta Cool, BSN, RN, CWS, Kim RN, BSN Entered By: Gretta Cool, BSN, RN, CWS, Kim on 01/24/2021 10:43:55

## 2021-01-28 ENCOUNTER — Encounter: Payer: Self-pay | Admitting: Family Medicine

## 2021-01-28 ENCOUNTER — Other Ambulatory Visit: Payer: Self-pay

## 2021-01-28 ENCOUNTER — Ambulatory Visit (INDEPENDENT_AMBULATORY_CARE_PROVIDER_SITE_OTHER): Payer: HMO | Admitting: Family Medicine

## 2021-01-28 VITALS — BP 122/80 | HR 101 | Temp 98.2°F | Ht 72.0 in | Wt 245.0 lb

## 2021-01-28 DIAGNOSIS — E669 Obesity, unspecified: Secondary | ICD-10-CM

## 2021-01-28 DIAGNOSIS — E1169 Type 2 diabetes mellitus with other specified complication: Secondary | ICD-10-CM | POA: Diagnosis not present

## 2021-01-28 DIAGNOSIS — Z23 Encounter for immunization: Secondary | ICD-10-CM | POA: Diagnosis not present

## 2021-01-28 LAB — POCT GLYCOSYLATED HEMOGLOBIN (HGB A1C): Hemoglobin A1C: 11.9 % — AB (ref 4.0–5.6)

## 2021-01-28 NOTE — Patient Instructions (Signed)
Let me see about referral options in the meantime.   Start using the old meter at home to check your sugar.  We'll go from there.  Take care.  Glad to see you.

## 2021-01-28 NOTE — Progress Notes (Signed)
This visit occurred during the SARS-CoV-2 public health emergency.  Safety protocols were in place, including screening questions prior to the visit, additional usage of staff PPE, and extensive cleaning of exam room while observing appropriate contact time as indicated for disinfecting solutions.  Diabetes:  Using medications without difficulties: yes Hypoglycemic episodes: no Hyperglycemic episodes: up to 375, then down to 215 in quick succession and he was questioning the accuracy of the meter.   Feet problems:rare transient numbness.   Blood Sugars averaging: see above.   eye exam within last year: pending, scheduled for the next few weeks.   He had trouble getting his meter to function, d/w pt.  He couldn't get help from the company with trouble shooting.   He couldn't get back surgery done until his A1c was lower than prev.  Still with "toothache pain" in the back.  Mobility d/w pt.  He is sedentary, not leaving home unless he has an appointment. He can walk with walker vs cane.     His backside is healed, clearly better from prev.    Meds, vitals, and allergies reviewed.  ROS: Per HPI unless specifically indicated in ROS section   GEN: nad, alert and oriented HEENT: ncat NECK: supple w/o LA CV: rrr. PULM: ctab, no inc wob ABD: soft, +bs EXT: no edema SKIN: no acute rash, buttock/gluteal crease lesion appears to be healed.  Diabetic foot exam: Normal inspection No skin breakdown Small calluses noted B distal MTs Normal DP pulses Normal sensation to light touch but dec sensation to monofilament Nails normal  30 minutes were devoted to patient care in this encounter (this includes time spent reviewing the patient's file/history, interviewing and examining the patient, counseling/reviewing plan with patient).

## 2021-01-30 ENCOUNTER — Telehealth: Payer: Self-pay

## 2021-01-30 NOTE — Progress Notes (Addendum)
Chronic Care Management Pharmacy Assistant   Name: [name]  MRN: [id number] DOB: Sep 08, 1940  Reason for Encounter: CCM (Diabetes Disease State)   Recent office visits:  01/28/2021 - Elsie Stain, MD - Patient presented for follow up for Diabetes. Labs: A1c. Referral to Endocrinology and Nutrition and Diabetes Services. No medication changes.   Recent consult visits:  01/24/2021 - [place] - Patient presented for follow up for pressure ulcer of left buttocks.   Hospital visits:  None since last CCM contact  Medications: Outpatient Encounter Medications as of 01/30/2021  Medication Sig   allopurinol (ZYLOPRIM) 300 MG tablet Take 0.5 tablets (150 mg total) by mouth every morning.   amLODipine (NORVASC) 10 MG tablet TAKE 1 TABLET BY MOUTH DAILY   Ascorbic Acid (VITAMIN C WITH ROSE HIPS) 500 MG tablet Take 500 mg by mouth daily.   aspirin 81 MG tablet Take 81 mg by mouth daily.   atorvastatin (LIPITOR) 10 MG tablet TAKE 1 TABLET BY MOUTH EACH EVENING   CINNAMON PO Take 200 mg by mouth daily.   Continuous Blood Gluc Receiver (FREESTYLE LIBRE 2 READER) DEVI USE TO CHECK BLOOD SUGAR DAILY   Continuous Blood Gluc Sensor (FREESTYLE LIBRE 2 SENSOR) MISC Use to check blood sugar daily. Dx E11.9   dapagliflozin propanediol (FARXIGA) 10 MG TABS tablet Take 1 tablet (10 mg total) by mouth daily before breakfast.   diclofenac sodium (VOLTAREN) 1 % GEL Uses PRN   dorzolamide-timolol (COSOPT) 22.3-6.8 MG/ML ophthalmic solution Place 1 drop into the left eye 2 (two) times daily.    Dulaglutide (TRULICITY) 3 LP/3.7TK SOPN Inject 3 mg as directed once a week.   gemfibrozil (LOPID) 600 MG tablet Take 1 tablet (600 mg total) by mouth 2 (two) times daily.   insulin glargine, 2 Unit Dial, (TOUJEO MAX SOLOSTAR) 300 UNIT/ML Solostar Pen 175 units injected in the AM   Insulin Pen Needle (B-D UF III MINI PEN NEEDLES) 31G X 5 MM MISC 1 each by Does not apply route daily. Use to administer  insulin daily   latanoprost (XALATAN) 0.005 % ophthalmic solution Place 1 drop into both eyes at bedtime.   losartan (COZAAR) 100 MG tablet TAKE 1 TABLET BY MOUTH DAILY   meloxicam (MOBIC) 15 MG tablet Take 0.5-1 tablets (7.5-15 mg total) by mouth daily as needed for pain (with food).   methylcellulose (CITRUCEL) oral powder Take 1 packet by mouth daily.   metoprolol succinate (TOPROL-XL) 25 MG 24 hr tablet TAKE 1 TABLET BY MOUTH DAILY   Multiple Vitamins-Minerals (VITRUM 50+ SENIOR MULTI PO) Take 1 tablet by mouth daily.    mupirocin ointment (BACTROBAN) 2 % Apply 1 application topically 2 (two) times daily.   Tadalafil 2.5 MG TABS TAKE 1 TABLET BY MOUTH DAILY   triamcinolone cream (KENALOG) 0.1 % Apply 1 application topically 2 (two) times daily as needed.   Vibegron (GEMTESA) 75 MG TABS Take 75 mg by mouth at bedtime.   No facility-administered encounter medications on file as of 01/30/2021.     Recent Relevant Labs: Lab Results  Component Value Date/Time   HGBA1C 11.9 (A) 01/28/2021 11:31 AM   HGBA1C 10.0 (A) 10/10/2020 09:24 AM   HGBA1C 9.9 (H) 07/17/2019 10:28 AM   HGBA1C 9.6 (H) 07/15/2018 09:35 AM    Kidney Function Lab Results  Component Value Date/Time   CREATININE 1.37 12/13/2020 10:46 AM   CREATININE 1.40 10/10/2020 09:51 AM   CREATININE 1.44 12/21/2014 12:00 AM   CREATININE  1.32 05/09/2014 12:00 AM   GFR 48.97 (L) 12/13/2020 10:46 AM   GFRNONAA 40 (L) 12/12/2019 03:55 PM   GFRAA 47 (L) 12/12/2019 03:55 PM   Contacted patient on 01/30/2021 to discuss diabetes disease state.    Current antihyperglycemic regimen:  Farxiga 10 mg - 1 tablet daily Toujeo 300 unit/ml Inject 175units every morning Trulicity 3 mg - Inject once weekly on Sundays    Patient verbally confirms he is taking the above medications as directed. Yes  What diet changes have been made to improve diabetes control? No diet changes.   What recent interventions/DTPs have been made to improve glycemic  control:  Patient has been referred to Endocrinology. Patient is also to work on nutrition/meal planning. Continue with Farxiga and Trulicity.   Have there been any recent hospitalizations or ED visits since last visit with CPP? No  Patient denies hypoglycemic symptoms, including Pale, Sweaty, Shaky, Hungry, Nervous/irritable, and Vision changes  Patient denies hyperglycemic symptoms, including blurry vision, excessive thirst, fatigue, polyuria, and weakness  How often are you checking your blood sugar? Patient is not checking it at home. His meter is broken. Patient did tell Dr. Duncan at 01/28/21 office visit he needed a new one; patient has not gone to get it yet.   Are you checking your feet daily/regularly? Yes  Adherence Review: Is the patient currently on a STATIN medication? Yes Is the patient currently on ACE/ARB medication? Yes Does the patient have >5 day gap between last estimated fill dates? Yes Atorvastatin   Care Gaps: Annual wellness visit in last year? No Most recent A1C reading:.11.9  on 01/28/2021 Most Recent BP reading: 22/80 on 01/28/2021  Last eye exam / retinopathy screening: 01/01/2020 Last diabetic foot exam: Up to date  Star Rating Drugs:  Medication:  Last Fill: Day Supply Farxiga 10 mg  01/18/2021       30 Toujeo max solostar   01/20/2021         30 Trulicity 3 mg               Patient assistance Atorvastatin 10mg       10/21/2020  90 Losartan 100mg          09 /17/2022    90  No appointments scheduled within the next 30 days.  Debbora Dus, CPP notified  Marijean Niemann, [place] Clinical Pharmacy Assistant [phone]  I have reviewed the care management and care coordination activities outlined in this encounter and I am certifying that I agree with the content of this note. Will continue to follow up on referral status (endocrinology/nutrition), self-monitoring BG and adherence. No further action required.  Debbora Dus, PharmD Clinical  Pharmacist [facility] [phone]

## 2021-01-30 NOTE — Assessment & Plan Note (Addendum)
We cannot really address his back with surgery until we get his sugar under better control.  Discussed diet and his medications.  I think we need to refer him to endocrinology.  He wanted a second opinion.  We will work on getting that set up and also work on Acupuncturist.  The goal is for him to be as mobile as possible, admitting that is mobility is currently limited due to his back.  Continue Farxiga Trulicity insulin in the meantime.

## 2021-02-03 ENCOUNTER — Telehealth: Payer: Self-pay | Admitting: Podiatry

## 2021-02-03 NOTE — Telephone Encounter (Signed)
Received [employer] auth # [id number] for diabetic shoes/inserts([id number] [phone] [id number]) valid 10.24.2022 thru 1.22.2023

## 2021-02-21 ENCOUNTER — Other Ambulatory Visit: Payer: Self-pay | Admitting: Family Medicine

## 2021-02-25 ENCOUNTER — Encounter: Payer: HMO | Attending: Physician Assistant | Admitting: *Deleted

## 2021-02-25 ENCOUNTER — Encounter: Payer: Self-pay | Admitting: *Deleted

## 2021-02-25 ENCOUNTER — Other Ambulatory Visit: Payer: Self-pay

## 2021-02-25 ENCOUNTER — Telehealth: Payer: Self-pay | Admitting: Podiatry

## 2021-02-25 VITALS — BP 140/84 | Ht 72.0 in | Wt 244.4 lb

## 2021-02-25 DIAGNOSIS — E1165 Type 2 diabetes mellitus with hyperglycemia: Secondary | ICD-10-CM | POA: Diagnosis not present

## 2021-02-25 DIAGNOSIS — Z794 Long term (current) use of insulin: Secondary | ICD-10-CM | POA: Diagnosis not present

## 2021-02-25 NOTE — Progress Notes (Signed)
Diabetes Self-Management Education  Visit Type: First/Initial  Appt. Start Time: 1340 Appt. End Time: 1500  02/25/2021  [name], identified by name and date of birth, is a 79 y.o. male with a diagnosis of Diabetes: Type 2.   ASSESSMENT  Blood pressure 140/84, height 6' (1.829 m), weight 244 lb 6.4 oz (110.9 kg). Body mass index is 33.15 kg/m.   Diabetes Self-Management Education - 02/25/21 1607       Visit Information   Visit Type First/Initial      Initial Visit   Diabetes Type Type 2    Are you currently following a meal plan? No    Are you taking your medications as prescribed? Yes    Date Diagnosed 1985      Health Coping   How would you rate your overall health? Fair      Psychosocial Assessment   Patient Belief/Attitude about Diabetes Other (comment)   "no comment"   Self-care barriers None    Self-management support Doctor's office;Family    Other persons present Spouse/SO    Patient Concerns Nutrition/Meal planning;Medication;Monitoring;Healthy Lifestyle;Glycemic Control;Weight Control    Special Needs None    Preferred Learning Style Visual    Learning Readiness Contemplating    How often do you need to have someone help you when you read instructions, pamphlets, or other written materials from your doctor or pharmacy? 1 - Never   His wife completed paper work for this appointment   What is the last grade level you completed in school? college      Pre-Education Assessment   Patient understands the diabetes disease and treatment process. Needs Review    Patient understands incorporating nutritional management into lifestyle. Needs Instruction    Patient undertands incorporating physical activity into lifestyle. Needs Instruction    Patient understands using medications safely. Needs Review    Patient understands monitoring blood glucose, interpreting and using results Needs Review    Patient understands prevention, detection, and treatment of acute  complications. Needs Review    Patient understands prevention, detection, and treatment of chronic complications. Needs Review    Patient understands how to develop strategies to address psychosocial issues. Needs Instruction    Patient understands how to develop strategies to promote health/change behavior. Needs Instruction      Complications   Last HgB A1C per patient/outside source 11.9 %   01/28/2021   How often do you check your blood sugar? > 4 times/day   Patient has [place] but didn't bring reader to this appointment.   Fasting Blood glucose range (mg/dL) >200   He reports FBG's 225-240 mg/dL.   Postprandial Blood glucose range (mg/dL) >200   He reports checking BG immediately after eating with readings of 280's mg/dL. He denies BG readings in 300's mg/dL.   Have you had a dilated eye exam in the past 12 months? Yes    Have you had a dental exam in the past 12 months? Yes    Are you checking your feet? Yes    How many days per week are you checking your feet? 7      Dietary Intake   Breakfast cheese, eggs, toast; ham biscuits; waffle    Snack (morning) 3-4 snacks/day - nuts, popcorn, chips, blue cheese dressing    Lunch hamburger and occasional fries, chicken sandwich, pimento cheese or ham salad sandwich    Dinner chicken, beef, pork, potatoes, occasional peas, beans, corn; rice, occasional green beans, broccoli, cauliflower, carrots, squash, zucchini  Beverage(s) water, diet soda, regular soda, sugar sweetened tea      Exercise   Exercise Type ADL's   waiting on back surgery but has to have A1C lower     Patient Education   Previous Diabetes Education Yes (please comment)   1 visit at [facility] - 2017   Disease state  Explored patient's options for treatment of their diabetes    Nutrition management  Role of diet in the treatment of diabetes and the relationship between the three main macronutrients and blood glucose level;Food label reading, portion sizes and  measuring food.;Reviewed blood glucose goals for pre and post meals and how to evaluate the patients' food intake on their blood glucose level.;Meal timing in regards to the patients' current diabetes medication.    Physical activity and exercise  Role of exercise on diabetes management, blood pressure control and cardiac health.    Medications Taught/reviewed insulin injection, site rotation, insulin storage and needle disposal.;Reviewed patients medication for diabetes, action, purpose, timing of dose and side effects, possible use of meal time insulin to bring A1C lower   Monitoring Purpose and frequency of SMBG.;Taught/discussed recording of test results and interpretation of SMBG.;Identified appropriate SMBG and/or A1C goals.;Other (comment)   FreeStyle Libre goals for TIR   Acute complications Taught treatment of hypoglycemia - the 15 rule.    Chronic complications Relationship between chronic complications and blood glucose control    Psychosocial adjustment Identified and addressed patients feelings and concerns about diabetes      Individualized Goals (developed by patient)   Reducing Risk Other (comment)   improve blood sugars, decrease medications, prevent diabetes complications, lose weight, lead a healthier lifestyle, become more fit     Outcomes   Expected Outcomes Demonstrated interest in learning. Expect positive outcomes    Future DMSE 2 months        Individualized Plan for Diabetes Self-Management Training:   Learning Objective:  Patient will have a greater understanding of diabetes self-management. Patient education plan is to attend individual and/or group sessions per assessed needs and concerns.   Plan:   Patient Instructions  Check blood sugars before each meal, 2 hrs after each meal, before bed and as needed with [place] reader to the next appointment  Exercise: Walk as tolerated  Eat 3 meals day,   2  snacks a day Space meals 4-6  hours apart Allow 2-3 hours between meals and snacks Avoid sugar sweetened drinks (tea, soda)  Complete 3 Day Food Record and bring to next appt  Carry fast acting glucose and a snack at all times Rotate injection sites Split insulin dose to 2 injections (don't give 175 units into one area)  Return for appointment: Tuesday April 08, 2021 at 1:30 pm with Freda Munro (nurse)  Expected Outcomes:  Demonstrated interest in learning. Expect positive outcomes  Education material provided:  General Meal Planning Guidelines Simple Meal Plan 3 Day Food Record Glucose tablets Symptoms, causes and treatments of Hypoglycemia   If problems or questions, patient to contact team via:   Johny Drilling, RN, [place] [phone]  Future DSME appointment: 2 months April 08, 2021

## 2021-02-25 NOTE — Telephone Encounter (Signed)
Diabetic shoes/inserts...lvm for pt to call to schedule an appt to pick them up.

## 2021-02-25 NOTE — Patient Instructions (Addendum)
Check blood sugars before each meal, 2 hrs after each meal, before bed and as needed with [place] reader to the next appointment  Exercise: Walk as tolerated  Eat 3 meals day,   2  snacks a day Space meals 4-6 hours apart Allow 2-3 hours between meals and snacks Avoid sugar sweetened drinks (tea, soda)  Complete 3 Day Food Record and bring to next appt  Carry fast acting glucose and a snack at all times Rotate injection sites Split insulin dose to 2 injections (don't give 175 units into one area)  Return for appointment: Tuesday April 08, 2021 at 1:30 pm with [name] (nurse)

## 2021-02-26 ENCOUNTER — Ambulatory Visit: Payer: HMO | Admitting: *Deleted

## 2021-02-27 ENCOUNTER — Telehealth: Payer: Self-pay | Admitting: Family Medicine

## 2021-02-27 NOTE — Telephone Encounter (Signed)
[name]  from [facility] called in requesting a new order for a [facility] for pt . # [phone]

## 2021-02-28 NOTE — Telephone Encounter (Signed)
Tried to call number provided but number is not in service. Not sure where this prescription needs to be sent to.

## 2021-03-03 ENCOUNTER — Telehealth: Payer: Self-pay

## 2021-03-03 NOTE — Chronic Care Management (AMB) (Addendum)
    Chronic Care Management Pharmacy Assistant   Name: [name]  MRN: [id number] DOB: 1940-06-19  Reason for Encounter: Reminder Call   Conditions to be addressed/monitored: HTN and DMII   Medications: Outpatient Encounter Medications as of 03/03/2021  Medication Sig   allopurinol (ZYLOPRIM) 300 MG tablet Take 0.5 tablets (150 mg total) by mouth every morning.   amLODipine (NORVASC) 10 MG tablet TAKE 1 TABLET BY MOUTH DAILY   Ascorbic Acid (VITAMIN C WITH ROSE HIPS) 500 MG tablet Take 500 mg by mouth daily.   aspirin 81 MG tablet Take 81 mg by mouth daily.   atorvastatin (LIPITOR) 10 MG tablet TAKE 1 TABLET BY MOUTH EACH EVENING   CINNAMON PO Take 200 mg by mouth daily.   Continuous Blood Gluc Receiver (FREESTYLE LIBRE 2 READER) DEVI USE TO CHECK BLOOD SUGAR DAILY   Continuous Blood Gluc Sensor (FREESTYLE LIBRE 2 SENSOR) MISC Use to check blood sugar daily. Dx E11.9   diclofenac sodium (VOLTAREN) 1 % GEL Uses PRN   dorzolamide-timolol (COSOPT) 22.3-6.8 MG/ML ophthalmic solution Place 1 drop into the left eye 2 (two) times daily.    Dulaglutide (TRULICITY) 3 CH/8.5ID SOPN Inject 3 mg as directed once a week.   FARXIGA 10 MG TABS tablet TAKE 1 TABLET BY MOUTH DAILY BEFORE BREAKFAST   gemfibrozil (LOPID) 600 MG tablet Take 1 tablet (600 mg total) by mouth 2 (two) times daily.   insulin glargine, 2 Unit Dial, (TOUJEO MAX SOLOSTAR) 300 UNIT/ML Solostar Pen 175 units injected in the AM   Insulin Pen Needle (B-D UF III MINI PEN NEEDLES) 31G X 5 MM MISC 1 each by Does not apply route daily. Use to administer insulin daily   latanoprost (XALATAN) 0.005 % ophthalmic solution Place 1 drop into both eyes at bedtime.   losartan (COZAAR) 100 MG tablet TAKE 1 TABLET BY MOUTH DAILY   meloxicam (MOBIC) 15 MG tablet Take 0.5-1 tablets (7.5-15 mg total) by mouth daily as needed for pain (with food).   methylcellulose (CITRUCEL) oral powder Take 1 packet by mouth daily.   metoprolol succinate  (TOPROL-XL) 25 MG 24 hr tablet TAKE 1 TABLET BY MOUTH DAILY   Multiple Vitamins-Minerals (VITRUM 50+ SENIOR MULTI PO) Take 1 tablet by mouth daily.    mupirocin ointment (BACTROBAN) 2 % Apply 1 application topically 2 (two) times daily.   Tadalafil 2.5 MG TABS TAKE 1 TABLET BY MOUTH DAILY   triamcinolone cream (KENALOG) 0.1 % Apply 1 application topically 2 (two) times daily as needed.   Vibegron (GEMTESA) 75 MG TABS Take 75 mg by mouth at bedtime.   No facility-administered encounter medications on file as of 03/03/2021.   [name]  was contacted to remind him of his upcoming telephone visit with Debbora Dus on 03/04/21 at 3:00pm. Patient was reminded to have all medications, supplements and any blood glucose and blood pressure readings available for review at appointment  Patient returned call to verify appointment.  The patient did not have any medication concerns at this time   Star Rating Drugs: Medication:  Last Fill: Day Supply Atorvastatin 10mg  10/21/20 90 Farxiga 10mg   02/24/21 30 Toujeo   01/20/21 30 Losartan 100mg  7/82/42 90 Trulicity 3mg   Patient assistance  Debbora Dus, CPP notified  Avel Sensor, [place] Assistant [phone]  Total time spent for month CPA: 10 min

## 2021-03-04 ENCOUNTER — Telehealth: Payer: Self-pay

## 2021-03-04 ENCOUNTER — Other Ambulatory Visit: Payer: Self-pay

## 2021-03-04 ENCOUNTER — Ambulatory Visit: Payer: HMO

## 2021-03-04 DIAGNOSIS — E1169 Type 2 diabetes mellitus with other specified complication: Secondary | ICD-10-CM

## 2021-03-04 DIAGNOSIS — E669 Obesity, unspecified: Secondary | ICD-10-CM

## 2021-03-04 DIAGNOSIS — E785 Hyperlipidemia, unspecified: Secondary | ICD-10-CM

## 2021-03-04 MED ORDER — TRULICITY 4.5 MG/0.5ML ~~LOC~~ SOAJ: 4.5000 mg | SUBCUTANEOUS | 1 refills | Status: DC

## 2021-03-04 NOTE — Telephone Encounter (Signed)
Dr. Damita Dunnings, patient is interested to restarting meal time insulin. His fasting BG is still in 200s.  OK to start Novolog 10 units before breakfast?  Debbora Dus, PharmD Clinical Pharmacist Practitioner [facility] at [facility] [phone]

## 2021-03-04 NOTE — Telephone Encounter (Signed)
Please proceed.  Please let me know if I need to send in the rx.  Thanks.

## 2021-03-04 NOTE — Patient Instructions (Signed)
Dear [name],  Below is a summary of the goals we discussed during our follow up appointment on March 04, 2021. Please contact me anytime with questions or concerns.   Visit Information  Patient Care Plan: CCM Pharmacy Care Plan     Problem Identified: [facility] AMB "PATIENT-SPECIFIC PROBLEM"      Long-Range Goal: Disease Management   Start Date: 06/04/2020  Priority: High  Note:   Current Barriers:  Unable to achieve control of diabetes    Pharmacist Clinical Goal(s):  Over the next 30 days, patient will adhere to plan to optimize therapeutic regimen for diabetes as evidenced by report of adherence to recommended medication management changes through collaboration with PharmD and provider.   Interventions: 1:1 collaboration with Ronnie G[place], MD regarding development and update of comprehensive plan of care as evidenced by provider attestation and co-signature Inter-disciplinary care team collaboration (see longitudinal plan of care) Comprehensive medication review performed; medication list updated in electronic medical record  Hypertension (BP goal <140/90) -Controlled per clinic readings within goal -Current treatment: Losartan 100 mg - 1 tablet daily Metoprolol Succinate ER - 1 tablet daily  Amlodipine 10 mg - 1 tablet daily -Medications previously tried: none reported  -Current home readings: none reported -Denies hypotensive/hypertensive symptoms -Reviewed refill history, no gaps in adherence  -Recommended to continue current medication  Hyperlipidemia: (LDL goal < 70) -Not ideally controlled - TG elevated 06/2020 lipid calc invalid, due for a repeat lipid panel -Current treatment: Atorvastatin 10 mg - 1 tablet daily  Gemfibrozil 600 mg - 1 tablet BID Aspirin 81 mg - 1 tablet daily -Medications previously tried: none - TG likely elevated due to uncontrolled diabetes. Continue to work on diabetes control. No history of statin intolerance so we could increase  statin. His TG have been as high as 943 2017. Ok to continue gemfibrozil. -Recommended to continue current medication; Recommend update lipid panel.  Diabetes (A1c goal <8%) -Uncontrolled - A1c 11.9% -Discussed endocrinology referral. Pt was not aware. Provided him [facility] phone # to call and schedule appointment with Dr. Kelton Pillar, MD.  -Current medications: Farxiga 10 mg - 1 tablet daily  Toujeo Max Solostar 300 unit/mL - 175 units every morning  Trulicity 4.5 mg - Inject once weekly on Sundays (missed past 2 Sundays - instructed to go ahead and take today and again next Sunday) -- needs a refill**  -Medications previously tried: Januvia (replaced with Trulicity), Metformin (GI intolerance) -Current home glucose readings - [employer], He is still having issues with supply running low before insurance will allow refill Fasting ranging: 210 yesterday, still in 200s most mornings -Denies hypoglycemic/hyperglycemic symptoms -Current meal patterns: see nutrition notes -Current exercise: bicycles at the [facility], tough right now due to back pain -He is still motivated to lower BG in order to proceed with back surgery. He asked about fast acting insulin. We discussed benefits/appropriate admin and teach back method. He would like to start Novolog if PCP approves. -Recommend: START Novolog 10 units 15 minutes before breakfast, pending PCP consult. Send any new prescriptions to [facility].  Patient Goals/Self-Care Activities Over the next 30 days, patient will:  - check glucose before breakfast and 2 hours after meals, document, and provide at future appointments  Follow Up Plan: CPP visit - 7 days      Patient verbalizes understanding of instructions provided today and agrees to view in [place].  Telephone follow up appointment with pharmacy team member scheduled for: 7 days  [facility]  [name], PharmD Clinical Pharmacist Practitioner [facility] at [facility] [phone]

## 2021-03-04 NOTE — Telephone Encounter (Signed)
[name], can you complete renewal application for Trulicity 4.5 mg weekly for 2023.

## 2021-03-04 NOTE — Telephone Encounter (Signed)
Agree.  Thanks.  Please let me know if I need to sent the Rx.

## 2021-03-04 NOTE — Telephone Encounter (Signed)
Contacted [facility] for patient as he needed a refill on Trulicity. Pt reports taking 4.5 mg weekly but current dose per [facility] is 3 mg weekly. His current prescription is expired. I went ahead and provided a verbal prescription for the 4.5 mg weekly dose, 2 month supply. I think patient will benefit from dose increase given uncontrolled BG readings. Will send to PCP for approval.  Debbora Dus, PharmD Clinical Pharmacist Practitioner [facility] at [facility] [phone]

## 2021-03-04 NOTE — Progress Notes (Signed)
Chronic Care Management Pharmacy Note  03/04/2021 Name:  [name] MRN:  [id number] DOB:  06-08-1940  Subjective: [name] is an 80 y.o. year old male who is a primary patient of Damita Dunnings, Elveria Rising, MD.  The CCM team was consulted for assistance with disease management and care coordination needs.    Engaged with patient by telephone for follow up visit in response to provider referral for pharmacy case management and/or care coordination services.    Consent to Services:  The patient was given information about Chronic Care Management services, agreed to services, and gave verbal consent prior to initiation of services.  Please see initial visit note for detailed documentation.   Patient Care Team: Tonia Ghent, MD as PCP - General (Family Medicine) Clent Jacks, MD as Consulting Physician (Ophthalmology) Renato Shin, MD as Consulting Physician (Endocrinology) Armbruster, Carlota Raspberry, MD as Consulting Physician (Gastroenterology) Irene Limbo, MD as Consulting Physician (Plastic Surgery) Garvin Fila, MD as Consulting Physician (Neurology) Debbora Dus, [facility] as Pharmacist (Pharmacist) Kristeen Miss, MD as Consulting Physician (Neurosurgery)  Recent office visits 01/28/21 - PCP - Pt presented for diabetes follow up. Foot exam completed. Refer to endocrinology and nutrition/meal planning. Continue current medications in meantime.  Recent consult visits: 02/25/21 - Nutrition/Diabetes education - Keep a 3 day food record and bring to next appt. Follow up January 2023.  Hospital visits: None in previous 6 months  Objective:  Lab Results  Component Value Date   CREATININE 1.37 12/13/2020   BUN 29 (H) 12/13/2020   GFR 48.97 (L) 12/13/2020   GFRNONAA 40 (L) 12/12/2019   GFRAA 47 (L) 12/12/2019   NA 135 12/13/2020   K 3.8 12/13/2020   CALCIUM 10.1 12/13/2020   CO2 24 12/13/2020   GLUCOSE 207 (H) 12/13/2020    Lab Results  Component Value Date/Time    HGBA1C 11.9 (A) 01/28/2021 11:31 AM   HGBA1C 10.0 (A) 10/10/2020 09:24 AM   HGBA1C 9.9 (H) 07/17/2019 10:28 AM   HGBA1C 9.6 (H) 07/15/2018 09:35 AM   FRUCTOSAMINE 392 (H) 07/11/2020 09:41 AM   GFR 48.97 (L) 12/13/2020 10:46 AM   GFR 47.77 (L) 10/10/2020 09:51 AM    Last diabetic Eye exam:  Lab Results  Component Value Date/Time   HMDIABEYEEXA No Retinopathy 01/01/2020 12:00 AM    Last diabetic Foot exam: 12/28/19 - PCP   Lab Results  Component Value Date   CHOL 179 07/17/2019   HDL 37.70 (L) 07/17/2019   LDLCALC 82 05/09/2014   LDLDIRECT 57.0 07/17/2019   TRIG (H) 07/17/2019    484.0 Triglyceride is over 400; calculations on Lipids are invalid.   CHOLHDL 5 07/17/2019    Hepatic Function Latest Ref Rng & Units 07/17/2019 07/15/2018 08/18/2016  Total Protein 6.0 - 8.3 g/dL 7.0 6.9 7.7  Albumin 3.5 - 5.2 g/dL 3.9 3.8 3.9  AST 0 - 37 U/L 38(H) 19 30  ALT 0 - 53 U/L 33 27 29  Alk Phosphatase 39 - 117 U/L 37(L) 36(L) 36(L)  Total Bilirubin 0.2 - 1.2 mg/dL 0.6 0.5 0.7    Lab Results  Component Value Date/Time   TSH 4.48 07/11/2020 09:41 AM    CBC Latest Ref Rng & Units 07/11/2020 12/12/2019 07/15/2018  WBC 4.0 - 10.5 K/uL 7.3 8.7 7.4  Hemoglobin 13.0 - 17.0 g/dL 15.4 14.6 14.4  Hematocrit 39.0 - 52.0 % 45.0 43.9 41.6  Platelets 150.0 - 400.0 K/uL 316.0 348 262.0    No results found for:  VD25OH  Clinical ASCVD: No  The ASCVD Risk score (Arnett DK, et al., 2019) failed to calculate for the following reasons:   The 2019 ASCVD risk score is only valid for ages 52 to 71    Depression screen PHQ 2/9 02/25/2021 02/19/2020 12/15/2019  Decreased Interest 0 0 0  Down, Depressed, Hopeless 0 0 0  PHQ - 2 Score 0 0 0  Altered sleeping - - -  Tired, decreased energy - - -  Change in appetite - - -  Feeling bad or failure about yourself  - - -  Trouble concentrating - - -  Moving slowly or fidgety/restless - - -  Suicidal thoughts - - -  PHQ-9 Score - - -  Difficult doing  work/chores - - -  Some recent data might be hidden    Social History   Tobacco Use  Smoking Status Never  Smokeless Tobacco Never   BP Readings from Last 3 Encounters:  02/25/21 140/84  01/28/21 122/80  10/30/20 130/78   Pulse Readings from Last 3 Encounters:  01/28/21 (!) 101  10/30/20 100  10/10/20 89   Wt Readings from Last 3 Encounters:  02/25/21 244 lb 6.4 oz (110.9 kg)  01/28/21 245 lb (111.1 kg)  10/30/20 261 lb (118.4 kg)   BMI Readings from Last 3 Encounters:  02/25/21 33.15 kg/m  01/28/21 33.23 kg/m  10/30/20 35.40 kg/m    Assessment/Interventions: Review of patient past medical history, allergies, medications, health status, including review of consultants reports, laboratory and other test data, was performed as part of comprehensive evaluation and provision of chronic care management services.   SDOH:  (Social Determinants of Health) assessments and interventions performed: March 2022    SDOH Interventions   Flowsheet Row Most Recent Value  SDOH Interventions    Financial Strain Interventions Intervention Not Indicated  [Trulicity through patient assistance. Other medications affordable.]      SDOH Interventions    Flowsheet Row Most Recent Value  SDOH Interventions   Financial Strain Interventions Intervention Not Indicated      SDOH Screenings   Alcohol Screen: Not on file  Depression (PHQ2-9): Low Risk    PHQ-2 Score: 0  Financial Resource Strain: Low Risk    Difficulty of Paying Living Expenses: Not very hard  Food Insecurity: Not on file  Housing: Not on file  Physical Activity: Not on file  Social Connections: Not on file  Stress: Not on file  Tobacco Use: Low Risk    Smoking Tobacco Use: Never   Smokeless Tobacco Use: Never   Passive Exposure: Not on file  Transportation Needs: Not on file      Central  Allergies  Allergen Reactions   Morphine And Related Nausea And Vomiting    Causes nausea & vomiting   Codeine  Sulfate Other (See Comments)    intolerant   Metformin And Related Other (See Comments)    Gi intolerance, not an allergy    Medications Reviewed Today     Reviewed by Inis Sizer, RN (Registered Nurse) on 02/25/21 at 1352  Med List Status: <None>   Medication Order Taking? Sig Documenting Provider Last Dose Status Informant  allopurinol (ZYLOPRIM) 300 MG tablet [id number] Yes Take 0.5 tablets (150 mg total) by mouth every morning. Tonia Ghent, MD Taking Active   amLODipine (NORVASC) 10 MG tablet [id number] Yes TAKE 1 TABLET BY MOUTH DAILY Tonia Ghent, MD Taking Active   Ascorbic Acid (VITAMIN C WITH ROSE HIPS)  500 MG tablet [id number] Yes Take 500 mg by mouth daily. [provider] Taking Active   aspirin 81 MG tablet [id number] Yes Take 81 mg by mouth daily. [provider] Taking Active Self  atorvastatin (LIPITOR) 10 MG tablet [id number] Yes TAKE 1 TABLET BY MOUTH EACH EVENING Tonia Ghent, MD Taking Active   CINNAMON PO [id number] Yes Take 200 mg by mouth daily. [provider] Taking Active   Continuous Blood Gluc Receiver (FREESTYLE LIBRE 2 READER) [place] [id number] Yes USE TO CHECK BLOOD SUGAR DAILY Tonia Ghent, MD Taking Active   Continuous Blood Gluc Sensor (FREESTYLE LIBRE 2 SENSOR) [place] [id number] Yes Use to check blood sugar daily. Dx E11.9 Tonia Ghent, MD Taking Active   diclofenac sodium (VOLTAREN) 1 % GEL [id number] Yes Uses PRN [provider] Taking Active   dorzolamide-timolol (COSOPT) 22.3-6.8 MG/ML ophthalmic solution [id number] Yes Place 1 drop into the left eye 2 (two) times daily.  [provider] Taking Active   Dulaglutide (TRULICITY) 3 VF/6.4PP SOPN [id number] Yes Inject 3 mg as directed once a week. Tonia Ghent, MD Taking Active   FARXIGA 10 MG TABS tablet [id number] Yes TAKE 1 TABLET BY MOUTH DAILY BEFORE BREAKFAST Tonia Ghent, MD Taking Active   gemfibrozil (LOPID) 600 MG tablet [id number] Yes  Take 1 tablet (600 mg total) by mouth 2 (two) times daily. Tonia Ghent, MD Taking Active   insulin glargine, 2 Unit Dial, (TOUJEO MAX SOLOSTAR) 300 UNIT/ML Solostar Pen [id number] Yes 175 units injected in the AM Tonia Ghent, MD Taking Active   Insulin Pen Needle (B-D UF III MINI PEN NEEDLES) 31G X 5 MM MISC [id number] Yes 1 each by Does not apply route daily. Use to administer insulin daily Tonia Ghent, MD Taking Active   latanoprost (XALATAN) 0.005 % ophthalmic solution [id number] Yes Place 1 drop into both eyes at bedtime. [provider] Taking Active Self  losartan (COZAAR) 100 MG tablet [id number] Yes TAKE 1 TABLET BY MOUTH DAILY Tonia Ghent, MD Taking Active   meloxicam (MOBIC) 15 MG tablet [id number] Yes Take 0.5-1 tablets (7.5-15 mg total) by mouth daily as needed for pain (with food). Tonia Ghent, MD Taking Active   methylcellulose (CITRUCEL) oral powder [id number] Yes Take 1 packet by mouth daily. Yetta Flock, MD Taking Active   metoprolol succinate (TOPROL-XL) 25 MG 24 hr tablet [id number] Yes TAKE 1 TABLET BY MOUTH DAILY Tonia Ghent, MD Taking Active   Multiple Vitamins-Minerals (VITRUM 50+ SENIOR MULTI PO) [id number] Yes Take 1 tablet by mouth daily.  [provider] Taking Active Self  mupirocin ointment (BACTROBAN) 2 % [id number] Yes Apply 1 application topically 2 (two) times daily. Tonia Ghent, MD Taking Active   Tadalafil 2.5 MG TABS [id number] Yes TAKE 1 TABLET BY MOUTH DAILY Tonia Ghent, MD Taking Active   triamcinolone cream (KENALOG) 0.1 % [id number] Yes Apply 1 application topically 2 (two) times daily as needed. Tonia Ghent, MD Taking Active   Vibegron [facility]) 75 MG TABS [id number] Yes Take 75 mg by mouth at bedtime. [provider] Taking Active             Patient Active Problem List   Diagnosis Date Noted   Back pain 10/22/2019   Fall at home 09/27/2019   Advance care planning 07/25/2019    Pressure ulcer, buttock 04/17/2019   Skin irritation 03/29/2019   Gout 07/22/2018   HTN (hypertension) 06/11/2017  Hyperlipemia    Lower urinary tract symptoms (LUTS) 03/04/2017   Bell's palsy 10/11/2016   Paresthesia 05/28/2016   Medicare annual wellness visit, subsequent 04/22/2016   Cough 04/22/2016   Allergic rhinitis 03/12/2016   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   Erectile dysfunction 06/11/2015   Rhinorrhea 03/13/2015   S/P revision of total hip 01/10/2015   Dislocation, hip ([place]) 11/09/2014   DJD (degenerative joint disease) 11/06/2014   Primary osteoarthritis of right knee    Primary localized osteoarthritis of right hip    Spinal stenosis of lumbar region with radiculopathy    Diabetes mellitus type 2 in obese (HCC)    PONV (postoperative nausea and vomiting)    GERD (gastroesophageal reflux disease)    Frequent urination at night    Kidney stones    Arthritis    Neuromuscular disorder ([place])    Benign prostatic hyperplasia with urinary obstruction 07/14/2012   Chronic calculus cholecystitis 12/31/2011   Gouty arthropathy 03/01/2009    Immunization History  Administered Date(s) Administered   Fluad Quad(high Dose 65+) 12/28/2019, 01/28/2021   Influenza Split 01/30/2009, 02/07/2010   Influenza, High Dose Seasonal PF 12/17/2011, 01/14/2017   Influenza,inj,Quad PF,6+ Mos 02/17/2016, 01/19/2018   Influenza,inj,quad, With Preservative 12/01/2018   Influenza,trivalent, recombinat, inj, PF 01/02/2011   Influenza-Unspecified 03/11/2015, 12/01/2018   PFIZER(Purple Top)SARS-COV-2 Vaccination 04/22/2019, 05/13/2019, 12/13/2019   PPD Test 08/04/2008   Pneumococcal Conjugate-13 02/07/2010, 03/26/2014   Pneumococcal Polysaccharide-23 02/07/2010   Td 04/04/2012   Tdap 03/26/2006   Zoster, Live 03/30/2009    Conditions to be addressed/monitored:  Hypertension, Hyperlipidemia, and Diabetes  Care Plan : [place]  Updates made by Debbora Dus, [place] since  03/04/2021 12:00 AM     Problem: CHL AMB "PATIENT-SPECIFIC PROBLEM"      Long-Range Goal: Disease Management   Start Date: 06/04/2020  Priority: High  Note:   Current Barriers:  Unable to achieve control of diabetes    Pharmacist Clinical Goal(s):  Over the next 30 days, patient will adhere to plan to optimize therapeutic regimen for diabetes as evidenced by report of adherence to recommended medication management changes through collaboration with PharmD and provider.   Interventions: 1:1 collaboration with Tonia Ghent, MD regarding development and update of comprehensive plan of care as evidenced by provider attestation and co-signature Inter-disciplinary care team collaboration (see longitudinal plan of care) Comprehensive medication review performed; medication list updated in electronic medical record  Hypertension (BP goal <140/90) -Controlled per clinic readings within goal -Current treatment: Losartan 100 mg - 1 tablet daily Metoprolol Succinate ER - 1 tablet daily  Amlodipine 10 mg - 1 tablet daily -Medications previously tried: none reported  -Current home readings: none reported -Denies hypotensive/hypertensive symptoms -Reviewed refill history, no gaps in adherence  -Recommended to continue current medication  Hyperlipidemia: (LDL goal < 70) -Not ideally controlled - TG elevated 06/2020 lipid calc invalid, due for a repeat lipid panel -Current treatment: Atorvastatin 10 mg - 1 tablet daily  Gemfibrozil 600 mg - 1 tablet BID Aspirin 81 mg - 1 tablet daily -Medications previously tried: none - TG likely elevated due to uncontrolled diabetes. Continue to work on diabetes control. No history of statin intolerance so we could increase statin. His TG have been as high as 943 2017. Ok to continue gemfibrozil. -Recommended to continue current medication; Recommend update lipid panel.  Diabetes (A1c goal <8%) -Uncontrolled - A1c 11.9% -Discussed endocrinology  referral. Pt was not aware. Provided him [facility] phone # to call  and schedule appointment with Dr. Kelton Pillar, MD.  -Current medications: Farxiga 10 mg - 1 tablet daily  Toujeo Max Solostar 300 unit/mL - 175 units every morning  Trulicity 4.5 mg - Inject once weekly on 'Sundays (missed past 2 Sundays - instructed to go ahead and take today and again next Sunday) -- needs a refill**  -Medications previously tried: Januvia (replaced with Trulicity), Metformin (GI intolerance) -Current home glucose readings - Freestyle Libre, He is still having issues with supply running low before insurance will allow refill Fasting ranging: 210 yesterday, still in 200s most mornings -Denies hypoglycemic/hyperglycemic symptoms -Current meal patterns: see nutrition notes -Current exercise: bicycles at the [employer], tough right now due to back pain -He is still motivated to lower BG in order to proceed with back surgery. He asked about fast acting insulin. We discussed benefits/appropriate admin and teach back method. He would like to start Novolog if PCP approves. -Recommend: START Novolog 10 units 15 minutes before breakfast, pending PCP consult. Send any new prescriptions to [facility].  Patient Goals/Self-Care Activities Over the next 30 days, patient will:  - check glucose before breakfast and 2 hours after meals, document, and provide at future appointments  Follow Up Plan: CPP visit - 7 days      Medication Assistance: Trulicity obtained through [employer] medication assistance program.  Enrollment ends end of 2022. Due for enrollment. Patient needs a refill.  Compliance/Adherence/Medication fill history: Care Gaps: Diabetic eye exam due  Star Rating Drugs: Medication:                Last Fill:         Day Supply Atorvastatin 10 mg       10/21/20            90 Farxiga 10 mg              01/2821           30 Losartan 100 mg          12/14/20            90'   Statin past due for  refill.  Patient's preferred pharmacy is:  [facility], [place] [address] Phone: [phone] Fax: [phone]  [facility], [place] - [address] [place] [address] Phone: [phone] Fax: [phone]  Uses pill box? Yes Pt endorses 100% compliance  Care Plan and Follow Up Patient Decision:  Patient agrees to Care Plan and Follow-up.  Debbora Dus, PharmD Clinical Pharmacist [facility] at [facility] [phone]

## 2021-03-05 MED ORDER — INSULIN ASPART 100 UNIT/ML FLEXPEN: 10.0000 [IU] | PEN_INJECTOR | Freq: Every day | SUBCUTANEOUS | 0 refills | Status: DC

## 2021-03-05 NOTE — Telephone Encounter (Addendum)
Contacted patient to let him know new prescription for Novolog sent to [facility]. Left voicemail.

## 2021-03-06 NOTE — Telephone Encounter (Signed)
Called number provided again but number does not work; called patient and lmtcb.

## 2021-03-07 NOTE — Telephone Encounter (Signed)
Called patient and LMTCB; need to know where he would like refill on the [place] sent to.

## 2021-03-11 ENCOUNTER — Other Ambulatory Visit: Payer: Self-pay

## 2021-03-11 ENCOUNTER — Ambulatory Visit (INDEPENDENT_AMBULATORY_CARE_PROVIDER_SITE_OTHER): Payer: HMO

## 2021-03-11 ENCOUNTER — Other Ambulatory Visit: Payer: Self-pay | Admitting: Family Medicine

## 2021-03-11 DIAGNOSIS — E1169 Type 2 diabetes mellitus with other specified complication: Secondary | ICD-10-CM

## 2021-03-11 DIAGNOSIS — E785 Hyperlipidemia, unspecified: Secondary | ICD-10-CM

## 2021-03-11 DIAGNOSIS — E669 Obesity, unspecified: Secondary | ICD-10-CM

## 2021-03-11 NOTE — Telephone Encounter (Signed)
Refill has been sent to [place] [facility].

## 2021-03-11 NOTE — Progress Notes (Signed)
Chronic Care Management Pharmacy Note  03/11/2021 Name:  JAKHARI SPACE MRN:  [id number] DOB:  1940-06-01  Subjective: [name] is an 80 y.o. year old male who is a primary patient of Damita Dunnings, Elveria Rising, MD.  The CCM team was consulted for assistance with disease management and care coordination needs.    Engaged with patient by telephone for follow up visit in response to provider referral for pharmacy case management and/or care coordination services.    Consent to Services:  The patient was given information about Chronic Care Management services, agreed to services, and gave verbal consent prior to initiation of services.  Please see initial visit note for detailed documentation.   Patient Care Team: Tonia Ghent, MD as PCP - General (Family Medicine) Clent Jacks, MD as Consulting Physician (Ophthalmology) Renato Shin, MD as Consulting Physician (Endocrinology) Armbruster, Carlota Raspberry, MD as Consulting Physician (Gastroenterology) Irene Limbo, MD as Consulting Physician (Plastic Surgery) Garvin Fila, MD as Consulting Physician (Neurology) Debbora Dus, [facility] as Pharmacist (Pharmacist) Kristeen Miss, MD as Consulting Physician (Neurosurgery)  Recent office visits 03/04/21 - CCM - Reminded patient to schedule appt for endocrinology initial consult. Start Novolog 10 units before breakfast. Increase Trulicity to 4.5 mg weekly (through patient assistance).  Recent consult visits: 02/25/21 - Nutrition/Diabetes education - Keep a 3 day food record and bring to next appt. Follow up January 2023.  Hospital visits: None in previous 6 months  Objective:  Lab Results  Component Value Date   CREATININE 1.37 12/13/2020   BUN 29 (H) 12/13/2020   GFR 48.97 (L) 12/13/2020   GFRNONAA 40 (L) 12/12/2019   GFRAA 47 (L) 12/12/2019   NA 135 12/13/2020   K 3.8 12/13/2020   CALCIUM 10.1 12/13/2020   CO2 24 12/13/2020   GLUCOSE 207 (H) 12/13/2020    Lab Results   Component Value Date/Time   HGBA1C 11.9 (A) 01/28/2021 11:31 AM   HGBA1C 10.0 (A) 10/10/2020 09:24 AM   HGBA1C 9.9 (H) 07/17/2019 10:28 AM   HGBA1C 9.6 (H) 07/15/2018 09:35 AM   FRUCTOSAMINE 392 (H) 07/11/2020 09:41 AM   GFR 48.97 (L) 12/13/2020 10:46 AM   GFR 47.77 (L) 10/10/2020 09:51 AM    Last diabetic Eye exam:  Lab Results  Component Value Date/Time   HMDIABEYEEXA No Retinopathy 01/01/2020 12:00 AM    Last diabetic Foot exam: 12/28/19 - PCP   Lab Results  Component Value Date   CHOL 179 07/17/2019   HDL 37.70 (L) 07/17/2019   LDLCALC 82 05/09/2014   LDLDIRECT 57.0 07/17/2019   TRIG (H) 07/17/2019    484.0 Triglyceride is over 400; calculations on Lipids are invalid.   CHOLHDL 5 07/17/2019    Hepatic Function Latest Ref Rng & Units 07/17/2019 07/15/2018 08/18/2016  Total Protein 6.0 - 8.3 g/dL 7.0 6.9 7.7  Albumin 3.5 - 5.2 g/dL 3.9 3.8 3.9  AST 0 - 37 U/L 38(H) 19 30  ALT 0 - 53 U/L 33 27 29  Alk Phosphatase 39 - 117 U/L 37(L) 36(L) 36(L)  Total Bilirubin 0.2 - 1.2 mg/dL 0.6 0.5 0.7    Lab Results  Component Value Date/Time   TSH 4.48 07/11/2020 09:41 AM    CBC Latest Ref Rng & Units 07/11/2020 12/12/2019 07/15/2018  WBC 4.0 - 10.5 K/uL 7.3 8.7 7.4  Hemoglobin 13.0 - 17.0 g/dL 15.4 14.6 14.4  Hematocrit 39.0 - 52.0 % 45.0 43.9 41.6  Platelets 150.0 - 400.0 K/uL 316.0 348 262.0  No results found for: VD25OH  Clinical ASCVD: No  The ASCVD Risk score (Arnett DK, et al., 2019) failed to calculate for the following reasons:   The 2019 ASCVD risk score is only valid for ages 102 to 57    Depression screen PHQ 2/9 02/25/2021 02/19/2020 12/15/2019  Decreased Interest 0 0 0  Down, Depressed, Hopeless 0 0 0  PHQ - 2 Score 0 0 0  Altered sleeping - - -  Tired, decreased energy - - -  Change in appetite - - -  Feeling bad or failure about yourself  - - -  Trouble concentrating - - -  Moving slowly or fidgety/restless - - -  Suicidal thoughts - - -  PHQ-9 Score -  - -  Difficult doing work/chores - - -  Some recent data might be hidden    Social History   Tobacco Use  Smoking Status Never  Smokeless Tobacco Never   BP Readings from Last 3 Encounters:  02/25/21 140/84  01/28/21 122/80  10/30/20 130/78   Pulse Readings from Last 3 Encounters:  01/28/21 (!) 101  10/30/20 100  10/10/20 89   Wt Readings from Last 3 Encounters:  02/25/21 244 lb 6.4 oz (110.9 kg)  01/28/21 245 lb (111.1 kg)  10/30/20 261 lb (118.4 kg)   BMI Readings from Last 3 Encounters:  02/25/21 33.15 kg/m  01/28/21 33.23 kg/m  10/30/20 35.40 kg/m    Assessment/Interventions: Review of patient past medical history, allergies, medications, health status, including review of consultants reports, laboratory and other test data, was performed as part of comprehensive evaluation and provision of chronic care management services.   SDOH:  (Social Determinants of Health) assessments and interventions performed: March 2022    SDOH Interventions   Flowsheet Row Most Recent Value  SDOH Interventions    Financial Strain Interventions Intervention Not Indicated  [Trulicity through patient assistance. Other medications affordable.]        SDOH Screenings   Alcohol Screen: Not on file  Depression (PHQ2-9): Low Risk    PHQ-2 Score: 0  Financial Resource Strain: Low Risk    Difficulty of Paying Living Expenses: Not very hard  Food Insecurity: Not on file  Housing: Not on file  Physical Activity: Not on file  Social Connections: Not on file  Stress: Not on file  Tobacco Use: Low Risk    Smoking Tobacco Use: Never   Smokeless Tobacco Use: Never   Passive Exposure: Not on file  Transportation Needs: Not on file      [place]  Allergies  Allergen Reactions   Morphine And Related Nausea And Vomiting    Causes nausea & vomiting   Codeine Sulfate Other (See Comments)    intolerant   Metformin And Related Other (See Comments)    Gi intolerance, not an  allergy    Medications Reviewed Today     Reviewed by Debbora Dus, [facility] (Pharmacist) on 03/14/21 at 1109  Med List Status: <None>   Medication Order Taking? Sig Documenting Provider Last Dose Status Informant  allopurinol (ZYLOPRIM) 300 MG tablet [id number] No Take 0.5 tablets (150 mg total) by mouth every morning. Tonia Ghent, MD Taking Active   amLODipine (NORVASC) 10 MG tablet [id number] No TAKE 1 TABLET BY MOUTH DAILY Tonia Ghent, MD Taking Active   Ascorbic Acid (VITAMIN C WITH ROSE HIPS) 500 MG tablet [id number] No Take 500 mg by mouth daily. [provider] Taking Active   aspirin 81 MG tablet  [id number] No Take 81 mg by mouth daily. [provider] Taking Active Self  atorvastatin (LIPITOR) 10 MG tablet [id number] No TAKE 1 TABLET BY MOUTH EACH EVENING Tonia Ghent, MD Taking Active   CINNAMON PO [id number] No Take 200 mg by mouth daily. [provider] Taking Active   Continuous Blood Gluc Receiver (FREESTYLE LIBRE 2 READER) [place] [id number] No USE TO CHECK BLOOD SUGAR DAILY Tonia Ghent, MD Taking Active   Continuous Blood Gluc Sensor (FREESTYLE LIBRE 2 SENSOR) [place] [id number]  USE AS DIRECTED BY PROVIDER Tonia Ghent, MD  Active   diclofenac sodium (VOLTAREN) 1 % GEL [id number] No Uses PRN [provider] Taking Active   dorzolamide-timolol (COSOPT) 22.3-6.8 MG/ML ophthalmic solution [id number] No Place 1 drop into the left eye 2 (two) times daily.  [provider] Taking Active   Dulaglutide (TRULICITY) 4.5 LK/9.5FM SOPN [id number]  Inject 4.5 mg as directed once a week. Tonia Ghent, MD  Active   FARXIGA 10 MG TABS tablet [id number] No TAKE 1 TABLET BY MOUTH DAILY BEFORE BREAKFAST Tonia Ghent, MD Taking Active   gemfibrozil (LOPID) 600 MG tablet [id number] No Take 1 tablet (600 mg total) by mouth 2 (two) times daily. Tonia Ghent, MD Taking Active   insulin aspart (NOVOLOG) 100 UNIT/ML FlexPen [id number]  Inject 10  Units into the skin daily. Take 5-10 minutes before largest meal of the day. Tonia Ghent, MD  Active   insulin glargine, 2 Unit Dial, (TOUJEO MAX SOLOSTAR) 300 UNIT/ML Solostar Pen [id number] No 175 units injected in the AM Tonia Ghent, MD Taking Active   Insulin Pen Needle (B-D UF III MINI PEN NEEDLES) 31G X 5 MM MISC [id number] No 1 each by Does not apply route daily. Use to administer insulin daily Tonia Ghent, MD Taking Active   latanoprost (XALATAN) 0.005 % ophthalmic solution [id number] No Place 1 drop into both eyes at bedtime. [provider] Taking Active Self  losartan (COZAAR) 100 MG tablet [id number] No TAKE 1 TABLET BY MOUTH DAILY Tonia Ghent, MD Taking Active   meloxicam (MOBIC) 15 MG tablet [id number] No Take 0.5-1 tablets (7.5-15 mg total) by mouth daily as needed for pain (with food). Tonia Ghent, MD Taking Active   methylcellulose (CITRUCEL) oral powder [id number] No Take 1 packet by mouth daily. Yetta Flock, MD Taking Active   metoprolol succinate (TOPROL-XL) 25 MG 24 hr tablet [id number] No TAKE 1 TABLET BY MOUTH DAILY Tonia Ghent, MD Taking Active   Multiple Vitamins-Minerals (VITRUM 50+ SENIOR MULTI PO) [id number] No Take 1 tablet by mouth daily.  [provider] Taking Active Self  mupirocin ointment (BACTROBAN) 2 % [id number] No Apply 1 application topically 2 (two) times daily. Tonia Ghent, MD Taking Active   Tadalafil 2.5 MG TABS [id number] No TAKE 1 TABLET BY MOUTH DAILY Tonia Ghent, MD Taking Active   triamcinolone cream (KENALOG) 0.1 % [id number] No Apply 1 application topically 2 (two) times daily as needed. Tonia Ghent, MD Taking Active   Vibegron [facility]) 75 MG TABS [id number] No Take 75 mg by mouth at bedtime. [provider] Taking Active             Patient Active Problem List   Diagnosis Date Noted   Back pain 10/22/2019   Fall at home 09/27/2019   Advance care planning 07/25/2019    Pressure ulcer, buttock 04/17/2019   Skin irritation 03/29/2019   Gout 07/22/2018  HTN (hypertension) 06/11/2017   Hyperlipemia    Lower urinary tract symptoms (LUTS) 03/04/2017   Bell's palsy 10/11/2016   Paresthesia 05/28/2016   Medicare annual wellness visit, subsequent 04/22/2016   Cough 04/22/2016   Allergic rhinitis 03/12/2016   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   Erectile dysfunction 06/11/2015   Rhinorrhea 03/13/2015   S/P revision of total hip 01/10/2015   Dislocation, hip ([place]) 11/09/2014   DJD (degenerative joint disease) 11/06/2014   Primary osteoarthritis of right knee    Primary localized osteoarthritis of right hip    Spinal stenosis of lumbar region with radiculopathy    Diabetes mellitus type 2 in obese (HCC)    PONV (postoperative nausea and vomiting)    GERD (gastroesophageal reflux disease)    Frequent urination at night    Kidney stones    Arthritis    Neuromuscular disorder ([place])    Benign prostatic hyperplasia with urinary obstruction 07/14/2012   Chronic calculus cholecystitis 12/31/2011   Gouty arthropathy 03/01/2009    Immunization History  Administered Date(s) Administered   Fluad Quad(high Dose 65+) 12/28/2019, 01/28/2021   Influenza Split 01/30/2009, 02/07/2010   Influenza, High Dose Seasonal PF 12/17/2011, 01/14/2017   Influenza,inj,Quad PF,6+ Mos 02/17/2016, 01/19/2018   Influenza,inj,quad, With Preservative 12/01/2018   Influenza,trivalent, recombinat, inj, PF 01/02/2011   Influenza-Unspecified 03/11/2015, 12/01/2018   PFIZER(Purple Top)SARS-COV-2 Vaccination 04/22/2019, 05/13/2019, 12/13/2019   PPD Test 08/04/2008   Pneumococcal Conjugate-13 02/07/2010, 03/26/2014   Pneumococcal Polysaccharide-23 02/07/2010   Td 04/04/2012   Tdap 03/26/2006   Zoster, Live 03/30/2009    Conditions to be addressed/monitored:  Hypertension, Hyperlipidemia, and Diabetes  Care Plan : [place]  Updates made by Debbora Dus, [place] since  03/14/2021 12:00 AM     Problem: CHL AMB "PATIENT-SPECIFIC PROBLEM"      Long-Range Goal: Disease Management   Start Date: 06/04/2020  This Visit's Progress: Not on track  Priority: High  Note:   Current Barriers:  Unable to achieve control of diabetes    Pharmacist Clinical Goal(s):  Over the next 30 days, patient will adhere to plan to optimize therapeutic regimen for diabetes as evidenced by report of adherence to recommended medication management changes through collaboration with PharmD and provider.   Interventions: 1:1 collaboration with Tonia Ghent, MD regarding development and update of comprehensive plan of care as evidenced by provider attestation and co-signature Inter-disciplinary care team collaboration (see longitudinal plan of care) Comprehensive medication review performed; medication list updated in electronic medical record  Hypertension (BP goal <140/90) -Controlled , stable control - no update 03/11/21 -Current treatment: Losartan 100 mg - 1 tablet daily Metoprolol Succinate ER - 1 tablet daily  Amlodipine 10 mg - 1 tablet daily -Medications previously tried: none reported  -Current home readings: none reported -Denies hypotensive/hypertensive symptoms -Reviewed refill history, no gaps in adherence  -Recommended to continue current medication  Hyperlipidemia: (LDL goal < 70) -Not ideally controlled - TG elevated 06/2020 lipid calc invalid;  Pt is past due for statin refill. -Current treatment: Atorvastatin 10 mg - 1 tablet daily  Gemfibrozil 600 mg - 1 tablet BID Aspirin 81 mg - 1 tablet daily -Medications previously tried: none - TG likely elevated due to uncontrolled diabetes. Continue to work on diabetes control. No history of statin intolerance so we could increase statin. His TG have been as high as 943 2017. Ok to continue gemfibrozil. -Recommended to continue current medication; Recommend daily adherence to atorvastatin.  Diabetes (A1c goal  <8%) -Uncontrolled -  A1c 11.9% - Contacted patient to see how he is tolerating Novolog. States he has only taken one dose (before a big meal). States he is not eating much lately. He is not taking the Novolog once daily before breakfast as recommended. He has not checked BG due to [place] "not compatible". Needs a new rx for glucometer to go alongside his [place]. He states he does not have one currently. He did affirm taking his Trulicity 3 mg this <GLOVFIEPPIRJJOAC>_1<\/YSAYTKZSWFUXNATF>_5   Statin past due for refill. Contacted pharmacy to verify fill dates. Per pharmacy,  last fill date is accurate (10/21/20, 90DS). They do offer auto-refill if he agrees.   Patient's preferred pharmacy is:  [facility] [place], [place] - [address] [place] [address] Phone: [phone] Fax: [phone]   Uses pill box? Yes Pt  endorses 100% compliance  Care Plan and Follow Up Patient Decision:  Patient agrees to Care Plan and Follow-up.  Debbora Dus, PharmD Clinical Pharmacist [facility] at [facility] [phone]

## 2021-03-13 ENCOUNTER — Telehealth: Payer: Self-pay | Admitting: Podiatry

## 2021-03-13 NOTE — Progress Notes (Addendum)
Completed  renewal form for the Trulicity 4.5 mg.  Debbora Dus, CPP notified  Avel Sensor, [place] Assistant [phone]  Total time spent for month CPA: 20 min.

## 2021-03-13 NOTE — Telephone Encounter (Signed)
Diabetic shoes/inserts in..lvm for pt to call to schedule an appt to pick them up. 

## 2021-03-13 NOTE — Telephone Encounter (Signed)
Application reviewed. Sent to [facility] for print/mail to patient.

## 2021-03-14 MED ORDER — GLUCOSE BLOOD VI STRP: ORAL_STRIP | 6 refills | Status: AC

## 2021-03-14 MED ORDER — FREESTYLE LIBRE 2 SENSOR MISC: 11 refills | Status: AC

## 2021-03-14 MED ORDER — BLOOD GLUCOSE METER KIT
PACK | 0 refills | Status: AC
Start: 2021-03-14 — End: ?

## 2021-03-14 NOTE — Patient Instructions (Signed)
Dear [name],  Below is a summary of the goals we discussed during our follow up appointment on March 11, 2021. Please contact me anytime with questions or concerns.   Visit Information  Patient Care Plan: CCM Pharmacy Care Plan     Problem Identified: CHL AMB "PATIENT-SPECIFIC PROBLEM"      Long-Range Goal: Disease Management   Start Date: 06/04/2020  This Visit's Progress: Not on track  Priority: High  Note:   Current Barriers:  Unable to achieve control of diabetes    Pharmacist Clinical Goal(s):  Over the next 30 days, patient will adhere to plan to optimize therapeutic regimen for diabetes as evidenced by report of adherence to recommended medication management changes through collaboration with PharmD and provider.   Interventions: 1:1 collaboration with Tonia Ghent, MD regarding development and update of comprehensive plan of care as evidenced by provider attestation and co-signature Inter-disciplinary care team collaboration (see longitudinal plan of care) Comprehensive medication review performed; medication list updated in electronic medical record  Hypertension (BP goal <140/90) -Controlled , stable control - no update 03/11/21 -Current treatment: Losartan 100 mg - 1 tablet daily Metoprolol Succinate ER - 1 tablet daily  Amlodipine 10 mg - 1 tablet daily -Medications previously tried: none reported  -Current home readings: none reported -Denies hypotensive/hypertensive symptoms -Reviewed refill history, no gaps in adherence  -Recommended to continue current medication  Hyperlipidemia: (LDL goal < 70) -Not ideally controlled - TG elevated 06/2020 lipid calc invalid;  Pt is past due for statin refill. -Current treatment: Atorvastatin 10 mg - 1 tablet daily  Gemfibrozil 600 mg - 1 tablet BID Aspirin 81 mg - 1 tablet daily -Medications previously tried: none - TG likely elevated due to uncontrolled diabetes. Continue to work on diabetes control. No  history of statin intolerance so we could increase statin. His TG have been as high as 943 2017. Ok to continue gemfibrozil. -Recommended to continue current medication; Recommend daily adherence to atorvastatin.  Diabetes (A1c goal <8%) -Uncontrolled - A1c 11.9% - Contacted patient to see how he is tolerating Novolog. States he has only taken one dose (before a big meal). States he is not eating much lately. He is not taking the Novolog once daily before breakfast as recommended. He has not checked BG due to [name] "not compatible". Needs a new rx for glucometer to go alongside his [place]. He states he does not have one currently. He did affirm taking his Trulicity 3 mg this Sunday. He has not yet increased to the 4.5 mg. Two month supply of the 4.5 mg dose will arrive in mail on 03/14/21. -Pt has initial appointment with Dr. Kelton Pillar, MD 04/11/20 -Current medications: Farxiga 10 mg - 1 tablet daily  Toujeo Max Solostar 300 unit/mL - 175 units every morning  Trulicity 3 mg - Inject once weekly on Sundays (increased to 4.5 mg - pt will start new dose once it arrives in mail on 03/14/21.) Novolog - Inject 10 units before breakfast -Medications previously tried: Januvia (replaced with Trulicity), Metformin (GI intolerance) -Current home glucose readings - [employer], He is still having issues with supply running low before insurance will allow refill Fasting ranging: none reported today, has not checked  -Denies hypoglycemic/hyperglycemic symptoms -Current meal patterns: see nutrition notes -Current exercise: limited due to back pain -He is still motivated to lower BG in order to proceed with back surgery.  -Recommend: Increase Trulicity to 4.5 mg once it arrives in mail 03/14/21. Begin taking Novolog  10 units 5-10 minutes before breakfast instead of only before "large meals". Will send in a new rx for glucometer and update his rx for Elenor Legato to a 30 day supply instead of 14 day. Pt to ask his  pharmacist in person for assistance with his [place] device error code.  Patient Goals/Self-Care Activities Over the next 30 days, patient will:  - check glucose before breakfast and 2 hours after meals, document, and provide at future appointments (pick up new glucometer from pharmacy and ask pharmacist for assistance with Elenor Legato errors) - take Novolog once daily before breakfast - avoid missed doses of atorvastatin once daily    Follow Up Plan: CPP visit - 14 days  Medication Assistance: Trulicity 3 mg obtained through [employer] medication assistance program.  New rx for Trulicity 4.5 mg sent to program, but pt has not yet received new dose. Enrollment ends 03/29/21. Re-enrollment will be mailed to patient 03/19/21.      Patient verbalizes understanding of instructions provided today and agrees to view in [place].   Debbora Dus, PharmD Clinical Pharmacist Practitioner [facility] at [facility] [phone]

## 2021-03-19 NOTE — Telephone Encounter (Addendum)
PAP for Trulicity renewal mailed to the patient.   Debbora Dus, CPP notified  Avel Sensor, [place] Assistant [phone]

## 2021-03-21 ENCOUNTER — Ambulatory Visit: Payer: HMO

## 2021-03-21 ENCOUNTER — Other Ambulatory Visit: Payer: Self-pay | Admitting: Family Medicine

## 2021-03-25 NOTE — Progress Notes (Addendum)
Subjective:   [name] is a 80 y.o. male who presents for Medicare Annual/Subsequent preventive examination.  I connected with Placido Hangartner today by telephone and verified that I am speaking with the correct person using two identifiers. Location patient: home Location provider: work Persons participating in the virtual visit: patient, patient daughter [name], Marine scientist.    I discussed the limitations, risks, security and privacy concerns of performing an evaluation and management service by telephone and the availability of in person appointments. I also discussed with the patient that there may be a patient responsible charge related to this service. The patient expressed understanding and verbally consented to this telephonic visit.    Interactive audio and video telecommunications were attempted between this provider and patient, however failed, due to patient having technical difficulties OR patient did not have access to video capability.  We continued and completed visit with audio only.  Some vital signs may be absent or patient reported.   Time Spent with patient on telephone encounter: 30 minutes  Review of Systems     Cardiac Risk Factors include: advanced age (>62mn, >>47women);diabetes mellitus;hypertension;dyslipidemia     Objective:    Today's Vitals   03/26/21 1451  Weight: 245 lb (111.1 kg)  Height: 6' (1.829 m)   Body mass index is 33.23 kg/m.  Advanced Directives 03/26/2021 02/25/2021 12/15/2019 07/15/2018 05/11/2018 06/29/2017 06/23/2017  Does Patient Have a Medical Advance Directive? _0  Yes Yes  Type of AParamed[place]of [place] will [place] CityLiving will - [place] will Healthcare Power of [place] Does patient want to make changes to medical advance directive? Yes (MAU/Ambulatory/Procedural Areas -  Information given) No - Patient declined No - Patient declined - - - -  Copy of [place]? - - - Yes - validated most recent copy scanned in chart (See row information) - Yes Yes  Would patient like information on creating a medical advance directive? - - - - - - -  Pre-existing out of facility DNR order (yellow form or pink MOST form) - - - - - - -    Current Medications (verified) Outpatient Encounter Medications as of 03/26/2021  Medication Sig   allopurinol (ZYLOPRIM) 300 MG tablet Take 0.5 tablets (150 mg total) by mouth every morning.   amLODipine (NORVASC) 10 MG tablet TAKE 1 TABLET BY MOUTH DAILY   Ascorbic Acid (VITAMIN C WITH ROSE HIPS) 500 MG tablet Take 500 mg by mouth daily.   aspirin 81 MG tablet Take 81 mg by mouth daily.   atorvastatin (LIPITOR) 10 MG tablet TAKE 1 TABLET BY MOUTH EACH EVENING   blood glucose meter kit and supplies Dispense based on patient and insurance preference. Use up to four times daily as directed. (FOR ICD-10 E10.9, E11.9).   CINNAMON PO Take 200 mg by mouth daily.   Continuous Blood Gluc Receiver (FREESTYLE LIBRE 2 READER) DEVI USE TO CHECK BLOOD SUGAR DAILY   Continuous Blood Gluc Sensor (FREESTYLE LIBRE 2 SENSOR) MISC USE AS DIRECTED BY PROVIDER   diclofenac sodium (VOLTAREN) 1 % GEL Uses PRN   dorzolamide-timolol (COSOPT) 22.3-6.8 MG/ML ophthalmic solution Place 1 drop into the left eye 2 (two) times daily.    Dulaglutide (TRULICITY) 4.5 MKT/6.2BWSOPN Inject 4.5 mg as directed once a week.   FARXIGA 10 MG TABS tablet TAKE 1 TABLET BY MOUTH DAILY BEFORE  BREAKFAST   gemfibrozil (LOPID) 600 MG tablet Take 1 tablet (600 mg total) by mouth 2 (two) times daily.   glucose blood test strip Use as instructed to check blood glucose twice daily   insulin aspart (NOVOLOG) 100 UNIT/ML FlexPen Inject 10 Units into the skin daily. Take 5-10 minutes before largest meal of the day.   insulin glargine, 2 Unit Dial, (TOUJEO MAX SOLOSTAR)  300 UNIT/ML Solostar Pen 175 units injected in the AM   Insulin Pen Needle (B-D UF III MINI PEN NEEDLES) 31G X 5 MM MISC 1 each by Does not apply route daily. Use to administer insulin daily   latanoprost (XALATAN) 0.005 % ophthalmic solution Place 1 drop into both eyes at bedtime.   losartan (COZAAR) 100 MG tablet TAKE 1 TABLET BY MOUTH DAILY   meloxicam (MOBIC) 15 MG tablet TAKE 1/2 TO 1 TABLET BY MOUTH DAILY AS NEEDED FOR PAIN...TAKE WITH FOOD   methylcellulose (CITRUCEL) oral powder Take 1 packet by mouth daily.   metoprolol succinate (TOPROL-XL) 25 MG 24 hr tablet TAKE 1 TABLET BY MOUTH DAILY   Multiple Vitamins-Minerals (VITRUM 50+ SENIOR MULTI PO) Take 1 tablet by mouth daily.    mupirocin ointment (BACTROBAN) 2 % Apply 1 application topically 2 (two) times daily.   Tadalafil 2.5 MG TABS TAKE 1 TABLET BY MOUTH DAILY   triamcinolone cream (KENALOG) 0.1 % Apply 1 application topically 2 (two) times daily as needed.   Vibegron (GEMTESA) 75 MG TABS Take 75 mg by mouth at bedtime.   No facility-administered encounter medications on file as of 03/26/2021.    Allergies (verified) Morphine and related, Codeine sulfate, and Metformin and related   History: Past Medical History:  Diagnosis Date   Arthritis    arthirtis all over   Bell's palsy    Cataract 2020   bilateral eyes -pending surgery June 2020   Concussion    multiple- college football player   Diabetes mellitus type 2 in obese ([place])    Frequent urination at night    GERD (gastroesophageal reflux disease)    occ indigestion   Glaucoma    both eyes   Hyperlipemia    Hypertension    Kidney stones    PONV (postoperative nausea and vomiting)    Primary localized osteoarthritis of right hip    Primary osteoarthritis of right knee    Restless leg syndrome    Right knee DJD    Spinal stenosis of lumbar region with radiculopathy    s/p L4/L5 transforaminal blocks   Past Surgical History:  Procedure Laterality Date    ABDOMINAL HERNIA REPAIR     ANTERIOR HIP REVISION Right 01/08/2015   Procedure: RIGHT ANTERIOR HIP REVISION;  Surgeon: Renette Butters, MD;  Location: [facility];  Service: Orthopedics;  Laterality: Right;   BACK SURGERY     CANTHOPLASTY Right 02/09/2017   Procedure: RIGHT LATERAL CANTHOPLASTY;  Surgeon: Irene Limbo, MD;  Location: [facility];  Service: Plastics;  Laterality: Right;   CANTHOPLASTY Left 06/29/2017   Procedure: LEFT LOWER CANTHOPLASTY;  Surgeon: Irene Limbo, MD;  Location: [facility];  Service: Plastics;  Laterality: Left;   CATARACT EXTRACTION Left    COLONOSCOPY     ELBOW ARTHROSCOPY  1990   right   ELBOW SURGERY     right   HERNIA REPAIR  1980   inguinal   HIP CLOSED REDUCTION Right 11/09/2014   Procedure: CLOSED REDUCTION HIP, s/p total hip 8/9;  Surgeon: Quillian Quince  Dennie Bible, MD;  Location: [facility];  Service: Orthopedics;  Laterality: Right;   JOINT REPLACEMENT  2008   knee   LITHOTRIPSY     LUMBAR DISC SURGERY     2015   RECONSTRUCTION OF EYELID     REVISION TOTAL HIP ARTHROPLASTY Right 01/08/2015   SHOULDER ARTHROSCOPY  1980   right   SHOULDER SURGERY     right   STONE EXTRACTION WITH BASKET  2010   TOTAL HIP ARTHROPLASTY Right 11/06/2014   Procedure: RIGHT TOTAL HIP ARTHROPLASTY ANTERIOR APPROACH;  Surgeon: Renette Butters, MD;  Location: [facility];  Service: Orthopedics;  Laterality: Right;   TOTAL KNEE ARTHROPLASTY Left 07/2006   left knee   TOTAL KNEE ARTHROPLASTY Right 01/08/2014   Procedure: RIGHT TOTAL KNEE ARTHROPLASTY;  Surgeon: Lorn Junes, MD;  Location: [facility];  Service: Orthopedics;  Laterality: Right;   Family History  Problem Relation Age of Onset   Hypertension Mother    Kidney disease Mother    Hypertension Father    Diabetes Mellitus II Father    Colon polyps Father    Diabetes Mellitus II Brother    Diabetes Mellitus II Brother    Prostate cancer Brother    Colon cancer Neg Hx    Social History    Socioeconomic History   Marital status: Married    Spouse name: [name]   Number of children: 2   Years of education: Not on file   Highest education level: Not on file  Occupational History   Occupation: retired  Tobacco Use   Smoking status: Never   Smokeless tobacco: Never  Substance and Sexual Activity   Alcohol use: Yes    Comment: glass of wine every 3-4 months   Drug use: No   Sexual activity: Never  Other Topics Concern   Not on file  Social History Narrative   Lives with wife   Right handed   Drinks no caffeine   Social Determinants of Health   Financial Resource Strain: Low Risk    Difficulty of Paying Living Expenses: Not hard at all  Food Insecurity: No Food Insecurity   Worried About Charity fundraiser in the Last Year: Never true   Ran Out of Food in the Last Year: Never true  Transportation Needs: No Transportation Needs   Lack of Transportation (Medical): No   Lack of Transportation (Non-Medical): No  Physical Activity: Inactive   Days of Exercise per Week: 0 days   Minutes of Exercise per Session: 0 min  Stress: No Stress Concern Present   Feeling of Stress : Not at all  Social Connections: Moderately Isolated   Frequency of Communication with Friends and Family: Twice a week   Frequency of Social Gatherings with Friends and Family: Twice a week   Attends Religious Services: Never   Printmaker: No   Attends Music therapist: Never   Marital Status: Married    Tobacco Counseling Counseling given: Not Answered   Clinical Intake:  Pre-visit preparation completed: Yes  Pain : No/denies pain     BMI - recorded: 33.23 Nutritional Status: BMI > 30  Obese Nutritional Risks: None Diabetes: Yes CBG done?: No Did pt. bring in CBG monitor from home?: No  How often do you need to have someone help you when you read instructions, pamphlets, or other written materials from your doctor or pharmacy?: 1 -  Never  Diabetes:  Is the patient diabetic?  Yes  If diabetic, was a CBG obtained today?  Patient reports 32 Did the patient bring in their glucometer from home?  No  How often do you monitor your CBG's? daily.   Financial Strains and Diabetes Management:  Are you having any financial strains with the device, your supplies or your medication? No .  Does the patient want to be seen by Chronic Care Management for management of their diabetes?  No  Would the patient like to be referred to a Nutritionist or for Diabetic Management?  No   Diabetic Exams:  Diabetic Eye Exam: Completed 12/2020.   Diabetic Foot Exam: Completed 01/28/21.   Interpreter Needed?: No  Information entered by :: Orrin Brigham LPN   Activities of Daily Living In your present state of health, do you have any difficulty performing the following activities: 03/26/2021  Hearing? N  Vision? N  Difficulty concentrating or making decisions? N  Walking or climbing stairs? Y  Comment uses cane or walker  Dressing or bathing? N  Doing errands, shopping? N  Preparing Food and eating ? N  Using the Toilet? N  In the past six months, have you accidently leaked urine? Y  Do you have problems with loss of bowel control? N  Managing your Medications? N  Managing your Finances? N  Housekeeping or managing your Housekeeping? N  Some recent data might be hidden    Patient Care Team: Tonia Ghent, MD as PCP - General (Family Medicine) Clent Jacks, MD as Consulting Physician (Ophthalmology) Renato Shin, MD as Consulting Physician (Endocrinology) Armbruster, Carlota Raspberry, MD as Consulting Physician (Gastroenterology) Irene Limbo, MD as Consulting Physician (Plastic Surgery) Garvin Fila, MD as Consulting Physician (Neurology) Debbora Dus, [facility] as Pharmacist (Pharmacist) Kristeen Miss, MD as Consulting Physician (Neurosurgery)  Indicate any recent Medical Services you may have received from other than  [facility] providers in the past year (date may be approximate).     Assessment:   This is a routine wellness examination for Davius.  Hearing/Vision screen Hearing Screening - Comments:: No issues Vision Screening - Comments::  Last exam 12/2020, Dr. Schuyler Amor, wears glasses   Dietary issues and exercise activities discussed: Current Exercise Habits: The patient does not participate in regular exercise at present   Goals Addressed             This Visit's Progress    p       Would like to work on reducing blood sugar number for surgery       Depression Screen PHQ 2/9 Scores 03/26/2021 02/25/2021 02/19/2020 12/15/2019 07/20/2019 07/15/2018 07/13/2018  PHQ - 2 Score 0 0 0 0 0 0 0  PHQ- 9 Score - - - - - 0 -    Fall Risk Fall Risk  03/26/2021 02/25/2021 02/19/2020 07/20/2019 07/15/2018  Falls in the past year? 1 0 0 1 1  Comment - not in the last year - - tripped and fell; denies injury  Number falls in past yr: 1 - 0 1 0  Injury with Fall? 0 - 0 0 1  Risk for fall due to : Impaired balance/gait;Other (Comment) Impaired balance/gait;History of fall(s) - - -  Risk for fall due to: Comment tripped - - - -  Follow up Falls prevention discussed - Falls evaluation completed - -    FALL RISK PREVENTION PERTAINING TO THE HOME:  Any stairs in or around the home? Yes  If so, are there any without handrails? No  Home free of  loose throw rugs in walkways, pet beds, electrical cords, etc? Yes  Adequate lighting in your home to reduce risk of falls? Yes   ASSISTIVE DEVICES UTILIZED TO PREVENT FALLS:  Life alert? No  Use of a cane, walker or w/c? Yes , walker and cane Grab bars in the bathroom? Yes  Shower chair or bench in shower? No  Elevated toilet seat or a handicapped toilet? No   TIMED UP AND GO:  Was the test performed? No .    Cognitive Function: Normal cognitive status assessed by this Nurse Health Advisor. No abnormalities found.   MMSE - Mini Mental State Exam 07/15/2018  02/08/2017 10/14/2015  Not completed: Unable to complete - -  Orientation to time - 5 5  Orientation to Place - 5 5  Registration - 3 3  Attention/ Calculation - 0 0  Recall - 3 3  Language- name 2 objects - 0 0  Language- repeat - 1 1  Language- follow 3 step command - 3 3  Language- read & follow direction - 0 0  Write a sentence - 0 0  Copy design - 0 0  Total score - 20 20        Immunizations Immunization History  Administered Date(s) Administered   Fluad Quad(high Dose 65+) 12/28/2019, 01/28/2021   Influenza Split 01/30/2009, 02/07/2010   Influenza, High Dose Seasonal PF 12/17/2011, 01/14/2017   Influenza,inj,Quad PF,6+ Mos 02/17/2016, 01/19/2018   Influenza,inj,quad, With Preservative 12/01/2018   Influenza,trivalent, recombinat, inj, PF 01/02/2011   Influenza-Unspecified 03/11/2015, 12/01/2018   PFIZER(Purple Top)SARS-COV-2 Vaccination 04/22/2019, 05/13/2019, 12/13/2019   PPD Test 08/04/2008   Pfizer Covid-19 Vaccine Bivalent Booster 79yr & up 10/31/2020   Pneumococcal Conjugate-13 02/07/2010, 03/26/2014   Pneumococcal Polysaccharide-23 02/07/2010   Td 04/04/2012   Tdap 03/26/2006   Zoster, Live 03/30/2009    TDAP status: Due, Education has been provided regarding the importance of this vaccine. Advised may receive this vaccine at local pharmacy or Health Dept. Aware to provide a copy of the vaccination record if obtained from local pharmacy or Health Dept. Verbalized acceptance and understanding.  Flu Vaccine status: Up to date  Pneumococcal vaccine status: Up to date  Covid-19 vaccine status: Information provided on how to obtain vaccines.   Qualifies for Shingles Vaccine? Yes   Zostavax completed Yes   Shingrix Completed?: No.    Education has been provided regarding the importance of this vaccine. Patient has been advised to call insurance company to determine out of pocket expense if they have not yet received this vaccine. Advised may also receive vaccine  at local pharmacy or Health Dept. Verbalized acceptance and understanding.  Screening Tests Health Maintenance  Topic Date Due   Zoster Vaccines- Shingrix (1 of 2) Never done   OPHTHALMOLOGY EXAM  12/31/2020   HEMOGLOBIN A1C  07/28/2021   FOOT EXAM  01/28/2022   TETANUS/TDAP  04/04/2022   COLONOSCOPY (Pts [phone] coverage will need to be confirmed)  04/05/2023   Pneumonia Vaccine 6540Years old  Completed   INFLUENZA VACCINE  Completed   COVID-19 Vaccine  Completed   HPV VACCINES  Aged Out    Health Maintenance  Health Maintenance Due  Topic Date Due   Zoster Vaccines- Shingrix (1 of 2) Never done   OPHTHALMOLOGY EXAM  12/31/2020    Colorectal cancer screening: Type of screening: Colonoscopy. Completed 04/04/20. Repeat every 3 years  Lung Cancer Screening: (Low Dose CT Chest recommended if Age 80-80ears, 30 pack-year currently smoking OR  have quit w/in 15years.) does not qualify.    Additional Screening:  Hepatitis C Screening: does not qualify  Vision Screening: Recommended annual ophthalmology exams for early detection of glaucoma and other disorders of the eye. Is the patient up to date with their annual eye exam?  Yes  Who is the provider or what is the name of the office in which the patient attends annual eye exams? Dr. Schuyler Amor   Dental Screening: Recommended annual dental exams for proper oral hygiene  CRR required this visit?  No   CCM required this visit?  No      Plan:     I have personally reviewed and noted the following in the patients chart:   Medical and social history Use of alcohol, tobacco or illicit drugs  Current medications and supplements including opioid prescriptions. Patient is not currently taking opioid prescriptions. Functional ability and status Nutritional status Physical activity Advanced directives List of other physicians Hospitalizations, surgeries, and ER visits in previous 12 months Vitals Screenings to include  cognitive, depression, and falls Referrals and appointments  In addition, I have reviewed and discussed with patient certain preventive protocols, quality metrics, and best practice recommendations. A written personalized care plan for preventive services as well as general preventive health recommendations were provided to patient.   Due to this being a telephonic visit, the after visit summary with patients personalized plan was offered to patient via mail or my-chart.  Patient would like to access on my-chart.    Loma Messing, LPN   [id number]   Nurse Health Advisor  Nurse Notes: none ===================== I reviewed health advisor's note, was available for consultation on the day of service listed in this note, and agree with documentation and plan. Elsie Stain, MD.

## 2021-03-26 ENCOUNTER — Encounter: Payer: Self-pay | Admitting: Family Medicine

## 2021-03-26 ENCOUNTER — Ambulatory Visit (INDEPENDENT_AMBULATORY_CARE_PROVIDER_SITE_OTHER): Payer: HMO

## 2021-03-26 VITALS — Ht 72.0 in | Wt 245.0 lb

## 2021-03-26 DIAGNOSIS — Z0000 Encounter for general adult medical examination without abnormal findings: Secondary | ICD-10-CM | POA: Diagnosis not present

## 2021-03-26 NOTE — Patient Instructions (Addendum)
[name] , Thank you for taking time to complete your Medicare Wellness Visit. I appreciate your ongoing commitment to your health goals. Please review the following plan we discussed and let me know if I can assist you in the future.   Screening recommendations/referrals: Colonoscopy: no longer required Recommended yearly ophthalmology/optometry visit for glaucoma screening and checkup Recommended yearly dental visit for hygiene and checkup  Vaccinations: Influenza vaccine: up to date Pneumococcal vaccine: up to date Tdap vaccine: Due-May obtain vaccine at your local pharmacy. Shingles vaccine: Discuss with pharmacy   Covid-19: up to date  Advanced directives: Please bring a copy of Living Will and/or Healthcare Power of Attorney for your chart.   Conditions/risks identified: see problem list  Next appointment: Follow up in one year for your annual wellness visit.   Preventive Care 7 Years and Older, Male Preventive care refers to lifestyle choices and visits with your health care provider that can promote health and wellness. What does preventive care include? A yearly physical exam. This is also called an annual well check. Dental exams once or twice a year. Routine eye exams. Ask your health care provider how often you should have your eyes checked. Personal lifestyle choices, including: Daily care of your teeth and gums. Regular physical activity. Eating a healthy diet. Avoiding tobacco and drug use. Limiting alcohol use. Practicing safe sex. Taking low doses of aspirin every day. Taking vitamin and mineral supplements as recommended by your health care provider. What happens during an annual well check? The services and screenings done by your health care provider during your annual well check will depend on your age, overall health, lifestyle risk factors, and family history of disease. Counseling  Your health care provider may ask you questions about your: Alcohol  use. Tobacco use. Drug use. Emotional well-being. Home and relationship well-being. Sexual activity. Eating habits. History of falls. Memory and ability to understand (cognition). Work and work Statistician. Screening  You may have the following tests or measurements: Height, weight, and BMI. Blood pressure. Lipid and cholesterol levels. These may be checked every 5 years, or more frequently if you are over 30 years old. Skin check. Lung cancer screening. You may have this screening every year starting at age 26 if you have a 30-pack-year history of smoking and currently smoke or have quit within the past 15 years. Fecal occult blood test (FOBT) of the stool. You may have this test every year starting at age 22. Flexible sigmoidoscopy or colonoscopy. You may have a sigmoidoscopy every 5 years or a colonoscopy every 10 years starting at age 66. Prostate cancer screening. Recommendations will vary depending on your family history and other risks. Hepatitis C blood test. Hepatitis B blood test. Sexually transmitted disease (STD) testing. Diabetes screening. This is done by checking your blood sugar (glucose) after you have not eaten for a while (fasting). You may have this done every 1-3 years. Abdominal aortic aneurysm (AAA) screening. You may need this if you are a current or former smoker. Osteoporosis. You may be screened starting at age 6 if you are at high risk. Talk with your health care provider about your test results, treatment options, and if necessary, the need for more tests. Vaccines  Your health care provider may recommend certain vaccines, such as: Influenza vaccine. This is recommended every year. Tetanus, diphtheria, and acellular pertussis (Tdap, Td) vaccine. You may need a Td booster every 10 years. Zoster vaccine. You may need this after age 44. Pneumococcal 13-valent conjugate (  PCV13) vaccine. One dose is recommended after age 58. Pneumococcal polysaccharide  (PPSV23) vaccine. One dose is recommended after age 53. Talk to your health care provider about which screenings and vaccines you need and how often you need them. This information is not intended to replace advice given to you by your health care provider. Make sure you discuss any questions you have with your health care provider. Document Released: 04/12/2015 Document Revised: 12/04/2015 Document Reviewed: 01/15/2015 Elsevier Interactive Patient Education  2017 [place] Prevention in the Home Falls can cause injuries. They can happen to people of all ages. There are many things you can do to make your home safe and to help prevent falls. What can I do on the outside of my home? Regularly fix the edges of walkways and driveways and fix any cracks. Remove anything that might make you trip as you walk through a door, such as a raised step or threshold. Trim any bushes or trees on the path to your home. Use bright outdoor lighting. Clear any walking paths of anything that might make someone trip, such as rocks or tools. Regularly check to see if handrails are loose or broken. Make sure that both sides of any steps have handrails. Any raised decks and porches should have guardrails on the edges. Have any leaves, snow, or ice cleared regularly. Use sand or salt on walking paths during winter. Clean up any spills in your garage right away. This includes oil or grease spills. What can I do in the bathroom? Use night lights. Install grab bars by the toilet and in the tub and shower. Do not use towel bars as grab bars. Use non-skid mats or decals in the tub or shower. If you need to sit down in the shower, use a plastic, non-slip stool. Keep the floor dry. Clean up any water that spills on the floor as soon as it happens. Remove soap buildup in the tub or shower regularly. Attach bath mats securely with double-sided non-slip rug tape. Do not have throw rugs and other things on the  floor that can make you trip. What can I do in the bedroom? Use night lights. Make sure that you have a light by your bed that is easy to reach. Do not use any sheets or blankets that are too big for your bed. They should not hang down onto the floor. Have a firm chair that has side arms. You can use this for support while you get dressed. Do not have throw rugs and other things on the floor that can make you trip. What can I do in the kitchen? Clean up any spills right away. Avoid walking on wet floors. Keep items that you use a lot in easy-to-reach places. If you need to reach something above you, use a strong step stool that has a grab bar. Keep electrical cords out of the way. Do not use floor polish or wax that makes floors slippery. If you must use wax, use non-skid floor wax. Do not have throw rugs and other things on the floor that can make you trip. What can I do with my stairs? Do not leave any items on the stairs. Make sure that there are handrails on both sides of the stairs and use them. Fix handrails that are broken or loose. Make sure that handrails are as long as the stairways. Check any carpeting to make sure that it is firmly attached to the stairs. Fix any carpet that is  loose or worn. Avoid having throw rugs at the top or bottom of the stairs. If you do have throw rugs, attach them to the floor with carpet tape. Make sure that you have a light switch at the top of the stairs and the bottom of the stairs. If you do not have them, ask someone to add them for you. What else can I do to help prevent falls? Wear shoes that: Do not have high heels. Have rubber bottoms. Are comfortable and fit you well. Are closed at the toe. Do not wear sandals. If you use a stepladder: Make sure that it is fully opened. Do not climb a closed stepladder. Make sure that both sides of the stepladder are locked into place. Ask someone to hold it for you, if possible. Clearly mark and make  sure that you can see: Any grab bars or handrails. First and last steps. Where the edge of each step is. Use tools that help you move around (mobility aids) if they are needed. These include: Canes. Walkers. Scooters. Crutches. Turn on the lights when you go into a dark area. Replace any light bulbs as soon as they burn out. Set up your furniture so you have a clear path. Avoid moving your furniture around. If any of your floors are uneven, fix them. If there are any pets around you, be aware of where they are. Review your medicines with your doctor. Some medicines can make you feel dizzy. This can increase your chance of falling. Ask your doctor what other things that you can do to help prevent falls. This information is not intended to replace advice given to you by your health care provider. Make sure you discuss any questions you have with your health care provider. Document Released: 01/10/2009 Document Revised: 08/22/2015 Document Reviewed: 04/20/2014 Elsevier Interactive Patient Education  2017 [employer].

## 2021-03-28 ENCOUNTER — Encounter: Payer: Self-pay | Admitting: Family Medicine

## 2021-03-29 DIAGNOSIS — E1169 Type 2 diabetes mellitus with other specified complication: Secondary | ICD-10-CM

## 2021-03-29 DIAGNOSIS — E785 Hyperlipidemia, unspecified: Secondary | ICD-10-CM

## 2021-03-29 DIAGNOSIS — E669 Obesity, unspecified: Secondary | ICD-10-CM

## 2021-03-31 ENCOUNTER — Other Ambulatory Visit: Payer: Self-pay | Admitting: Family Medicine

## 2021-04-01 ENCOUNTER — Ambulatory Visit (INDEPENDENT_AMBULATORY_CARE_PROVIDER_SITE_OTHER): Payer: HMO

## 2021-04-01 ENCOUNTER — Other Ambulatory Visit: Payer: Self-pay

## 2021-04-01 ENCOUNTER — Telehealth: Payer: Self-pay

## 2021-04-01 DIAGNOSIS — I10 Essential (primary) hypertension: Secondary | ICD-10-CM

## 2021-04-01 DIAGNOSIS — E785 Hyperlipidemia, unspecified: Secondary | ICD-10-CM

## 2021-04-01 DIAGNOSIS — E1169 Type 2 diabetes mellitus with other specified complication: Secondary | ICD-10-CM

## 2021-04-01 MED ORDER — INSULIN ASPART 100 UNIT/ML FLEXPEN: 15.0000 [IU] | PEN_INJECTOR | Freq: Three times a day (TID) | SUBCUTANEOUS | 1 refills | Status: DC

## 2021-04-01 NOTE — Progress Notes (Signed)
Chronic Care Management Pharmacy Note  04/01/2021 Name:  [name] MRN:  [id number] DOB:  May 01, 1940  Summary: Patient resumed use of Freestyle Libre and has started taking Novolog regularly. He also increased Trulicity to 4.5 mg. Notes some improvement in BG levels, but still 200s or higher. Lowest 220 regardless of time of day. He is taking Novolog 10 units before meals (2-3 times/day) in addition to [place] 10 mg, Trulicity 4.5 mg, and Toujeo 175 units daily.   Recommendations: Increase Novolog before meals to 12 units, may increase further to 15 units if BG remains > 200. Take before all meals.  Plan:  04/03/20 - PCP follow up (bring [place] reader and Trulicity assistance paperwork) 04/08/20 - Diabetes nutrition 04/11/20 - Endocrinology initial consult  CCM follow up 30 days  Subjective: [name] is an 81 y.o. year old male who is a primary patient of Damita Dunnings, Elveria Rising, MD.  The CCM team was consulted for assistance with disease management and care coordination needs.    Engaged with patient by telephone for follow up visit in response to provider referral for pharmacy case management and/or care coordination services.    Consent to Services:  The patient was given information about Chronic Care Management services, agreed to services, and gave verbal consent prior to initiation of services.  Please see initial visit note for detailed documentation.   Patient Care Team: Tonia Ghent, MD as PCP - General (Family Medicine) Clent Jacks, MD as Consulting Physician (Ophthalmology) Renato Shin, MD as Consulting Physician (Endocrinology) Armbruster, Carlota Raspberry, MD as Consulting Physician (Gastroenterology) Irene Limbo, MD as Consulting Physician (Plastic Surgery) Garvin Fila, MD as Consulting Physician (Neurology) Debbora Dus, [facility] as Pharmacist (Pharmacist) Kristeen Miss, MD as Consulting Physician (Neurosurgery)  Recent office visits 03/04/21 - CCM -  Reminded patient to schedule appt for endocrinology initial consult. Start Novolog 10 units before breakfast. Increase Trulicity to 4.5 mg weekly (through patient assistance).  Recent consult visits: 02/25/21 - Nutrition/Diabetes education - Keep a 3 day food record and bring to next appt. Follow up January 2023.  Hospital visits: None in previous 6 months  Objective:  Lab Results  Component Value Date   CREATININE 1.37 12/13/2020   BUN 29 (H) 12/13/2020   GFR 48.97 (L) 12/13/2020   GFRNONAA 40 (L) 12/12/2019   GFRAA 47 (L) 12/12/2019   NA 135 12/13/2020   K 3.8 12/13/2020   CALCIUM 10.1 12/13/2020   CO2 24 12/13/2020   GLUCOSE 207 (H) 12/13/2020    Lab Results  Component Value Date/Time   HGBA1C 11.9 (A) 01/28/2021 11:31 AM   HGBA1C 10.0 (A) 10/10/2020 09:24 AM   HGBA1C 9.9 (H) 07/17/2019 10:28 AM   HGBA1C 9.6 (H) 07/15/2018 09:35 AM   FRUCTOSAMINE 392 (H) 07/11/2020 09:41 AM   GFR 48.97 (L) 12/13/2020 10:46 AM   GFR 47.77 (L) 10/10/2020 09:51 AM    Last diabetic Eye exam:  Lab Results  Component Value Date/Time   HMDIABEYEEXA No Retinopathy 01/01/2020 12:00 AM    Last diabetic Foot exam: 12/28/19 - PCP   Lab Results  Component Value Date   CHOL 179 07/17/2019   HDL 37.70 (L) 07/17/2019   LDLCALC 82 05/09/2014   LDLDIRECT 57.0 07/17/2019   TRIG (H) 07/17/2019    484.0 Triglyceride is over 400; calculations on Lipids are invalid.   CHOLHDL 5 07/17/2019    Hepatic Function Latest Ref Rng & Units 07/17/2019 07/15/2018 08/18/2016  Total Protein 6.0 - 8.3  g/dL 7.0 6.9 7.7  Albumin 3.5 - 5.2 g/dL 3.9 3.8 3.9  AST 0 - 37 U/L 38(H) 19 30  ALT 0 - 53 U/L 33 27 29  Alk Phosphatase 39 - 117 U/L 37(L) 36(L) 36(L)  Total Bilirubin 0.2 - 1.2 mg/dL 0.6 0.5 0.7    Lab Results  Component Value Date/Time   TSH 4.48 07/11/2020 09:41 AM    CBC Latest Ref Rng & Units 07/11/2020 12/12/2019 07/15/2018  WBC 4.0 - 10.5 K/uL 7.3 8.7 7.4  Hemoglobin 13.0 - 17.0 g/dL 15.4 14.6 14.4   Hematocrit 39.0 - 52.0 % 45.0 43.9 41.6  Platelets 150.0 - 400.0 K/uL 316.0 348 262.0    No results found for: VD25OH  Clinical ASCVD: No  The ASCVD Risk score (Arnett DK, et al., 2019) failed to calculate for the following reasons:   The 2019 ASCVD risk score is only valid for ages 60 to 65    Depression screen PHQ 2/9 03/26/2021 02/25/2021 02/19/2020  Decreased Interest 0 0 0  Down, Depressed, Hopeless 0 0 0  PHQ - 2 Score 0 0 0  Altered sleeping - - -  Tired, decreased energy - - -  Change in appetite - - -  Feeling bad or failure about yourself  - - -  Trouble concentrating - - -  Moving slowly or fidgety/restless - - -  Suicidal thoughts - - -  PHQ-9 Score - - -  Difficult doing work/chores - - -  Some recent data might be hidden    Social History   Tobacco Use  Smoking Status Never  Smokeless Tobacco Never   BP Readings from Last 3 Encounters:  02/25/21 140/84  01/28/21 122/80  10/30/20 130/78   Pulse Readings from Last 3 Encounters:  01/28/21 (!) 101  10/30/20 100  10/10/20 89   Wt Readings from Last 3 Encounters:  03/26/21 245 lb (111.1 kg)  02/25/21 244 lb 6.4 oz (110.9 kg)  01/28/21 245 lb (111.1 kg)   BMI Readings from Last 3 Encounters:  03/26/21 33.23 kg/m  02/25/21 33.15 kg/m  01/28/21 33.23 kg/m    Assessment/Interventions: Review of patient past medical history, allergies, medications, health status, including review of consultants reports, laboratory and other test data, was performed as part of comprehensive evaluation and provision of chronic care management services.   SDOH:  (Social Determinants of Health) assessments and interventions performed: March 2022    SDOH Interventions   Flowsheet Row Most Recent Value  SDOH Interventions    Financial Strain Interventions Intervention Not Indicated  [Trulicity through patient assistance. Other medications affordable.]        SDOH Screenings   Alcohol Screen: Low Risk    Last  Alcohol Screening Score (AUDIT): 1  Depression (PHQ2-9): Low Risk    PHQ-2 Score: 0  Financial Resource Strain: Low Risk    Difficulty of Paying Living Expenses: Not hard at all  Food Insecurity: No Food Insecurity   Worried About Charity fundraiser in the Last Year: Never true   Ran Out of Food in the Last Year: Never true  Housing: Low Risk    Last Housing Risk Score: 0  Physical Activity: Inactive   Days of Exercise per Week: 0 days   Minutes of Exercise per Session: 0 min  Social Connections: Moderately Isolated   Frequency of Communication with Friends and Family: Twice a week   Frequency of Social Gatherings with Friends and Family: Twice a week   Attends Religious  Services: Never   Marine scientist or Organizations: No   Attends Music therapist: Never   Marital Status: Married  Stress: No Stress Concern Present   Feeling of Stress : Not at all  Tobacco Use: Low Risk    Smoking Tobacco Use: Never   Smokeless Tobacco Use: Never   Passive Exposure: Not on file  Transportation Needs: No Transportation Needs   Lack of Transportation (Medical): No   Lack of Transportation (Non-Medical): No      CCM Care Plan  Allergies  Allergen Reactions   Morphine And Related Nausea And Vomiting    Causes nausea & vomiting   Codeine Sulfate Other (See Comments)    intolerant   Metformin And Related Other (See Comments)    Gi intolerance, not an allergy    Medications Reviewed Today     Reviewed by Debbora Dus, [facility] (Pharmacist) on 04/01/21 at 0855  Med List Status: <None>   Medication Order Taking? Sig Documenting Provider Last Dose Status Informant  allopurinol (ZYLOPRIM) 300 MG tablet [id number] Yes Take 0.5 tablets (150 mg total) by mouth every morning. Tonia Ghent, MD Taking Active   amLODipine (NORVASC) 10 MG tablet [id number] Yes TAKE 1 TABLET BY MOUTH DAILY Tonia Ghent, MD Taking Active   Ascorbic Acid (VITAMIN C WITH ROSE HIPS) 500 MG  tablet [id number] Yes Take 500 mg by mouth daily. [provider] Taking Active   aspirin 81 MG tablet [id number] Yes Take 81 mg by mouth daily. [provider] Taking Active Self  atorvastatin (LIPITOR) 10 MG tablet [id number] Yes TAKE 1 TABLET BY MOUTH EACH EVENING Tonia Ghent, MD Taking Active   blood glucose meter kit and supplies [id number] Yes Dispense based on patient and insurance preference. Use up to four times daily as directed. (FOR ICD-10 E10.9, E11.9). Tonia Ghent, MD Taking Active   CINNAMON PO [id number] Yes Take 200 mg by mouth daily. [provider] Taking Active   Continuous Blood Gluc Receiver (FREESTYLE LIBRE 2 READER) [place] [id number] Yes USE TO CHECK BLOOD SUGAR DAILY Tonia Ghent, MD Taking Active   Continuous Blood Gluc Sensor (FREESTYLE LIBRE 2 SENSOR) [place] [id number] Yes USE AS DIRECTED BY PROVIDER Tonia Ghent, MD Taking Active   diclofenac sodium (VOLTAREN) 1 % GEL [id number] Yes Uses PRN [provider] Taking Active   dorzolamide-timolol (COSOPT) 22.3-6.8 MG/ML ophthalmic solution [id number]  Place 1 drop into the left eye 2 (two) times daily.  [provider]  Active   Dulaglutide (TRULICITY) 4.5 IO/2.7OJ SOPN [id number] Yes Inject 4.5 mg as directed once a week. Tonia Ghent, MD Taking Active   FARXIGA 10 MG TABS tablet [id number] Yes TAKE 1 TABLET BY MOUTH DAILY BEFORE BREAKFAST Tonia Ghent, MD Taking Active   gemfibrozil (LOPID) 600 MG tablet [id number] Yes Take 1 tablet (600 mg total) by mouth 2 (two) times daily. Tonia Ghent, MD Taking Active   glucose blood test strip [id number] Yes Use as instructed to check blood glucose twice daily Tonia Ghent, MD Taking Active   insulin aspart (NOVOLOG) 100 UNIT/ML FlexPen [id number] Yes Inject 10 Units into the skin daily. Take 5-10 minutes before largest meal of the day. Tonia Ghent, MD Taking Active   insulin glargine, 2 Unit Dial, (TOUJEO MAX SOLOSTAR)  300 UNIT/ML Solostar Pen [id number]  175 units injected in the AM Tonia Ghent, MD  Active   Insulin Pen Needle (B-D UF III MINI PEN  NEEDLES) 31G X 5 MM MISC [id number]  1 each by Does not apply route daily. Use to administer insulin daily Tonia Ghent, MD  Active   latanoprost (XALATAN) 0.005 % ophthalmic solution [id number]  Place 1 drop into both eyes at bedtime. [provider]  Active Self  losartan (COZAAR) 100 MG tablet [id number]  TAKE 1 TABLET BY MOUTH DAILY Tonia Ghent, MD  Active   meloxicam (MOBIC) 15 MG tablet [id number]  TAKE 1/2 TO 1 TABLET BY MOUTH DAILY AS NEEDED FOR PAIN...TAKE WITH FOOD Tonia Ghent, MD  Active   methylcellulose (CITRUCEL) oral powder [id number]  Take 1 packet by mouth daily. Yetta Flock, MD  Active   metoprolol succinate (TOPROL-XL) 25 MG 24 hr tablet [id number]  TAKE 1 TABLET BY MOUTH DAILY Tonia Ghent, MD  Active   Multiple Vitamins-Minerals (VITRUM 50+ SENIOR MULTI PO) [id number]  Take 1 tablet by mouth daily.  [provider]  Active Self  mupirocin ointment (BACTROBAN) 2 % [id number]  Apply 1 application topically 2 (two) times daily. Tonia Ghent, MD  Active   Tadalafil 2.5 MG TABS [id number]  TAKE 1 TABLET BY MOUTH DAILY Tonia Ghent, MD  Active   triamcinolone cream (KENALOG) 0.1 % [id number]  Apply 1 application topically 2 (two) times daily as needed. Tonia Ghent, MD  Active   Vibegron [facility]) 75 MG TABS [id number]  Take 75 mg by mouth at bedtime. [provider]  Active             Patient Active Problem List   Diagnosis Date Noted   Back pain 10/22/2019   Fall at home 09/27/2019   Advance care planning 07/25/2019   Pressure ulcer, buttock 04/17/2019   Skin irritation 03/29/2019   Gout 07/22/2018   HTN (hypertension) 06/11/2017   Hyperlipemia    Lower urinary tract symptoms (LUTS) 03/04/2017   Bell's palsy 10/11/2016   Paresthesia 05/28/2016   Medicare annual wellness visit,  subsequent 04/22/2016   Cough 04/22/2016   Allergic rhinitis 03/12/2016   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   Erectile dysfunction 06/11/2015   Rhinorrhea 03/13/2015   S/P revision of total hip 01/10/2015   Dislocation, hip ([place]) 11/09/2014   DJD (degenerative joint disease) 11/06/2014   Primary osteoarthritis of right knee    Primary localized osteoarthritis of right hip    Spinal stenosis of lumbar region with radiculopathy    Diabetes mellitus type 2 in obese (HCC)    PONV (postoperative nausea and vomiting)    GERD (gastroesophageal reflux disease)    Frequent urination at night    Kidney stones    Arthritis    Neuromuscular disorder ([place])    Benign prostatic hyperplasia with urinary obstruction 07/14/2012   Chronic calculus cholecystitis 12/31/2011   Gouty arthropathy 03/01/2009    Immunization History  Administered Date(s) Administered   Fluad Quad(high Dose 65+) 12/28/2019, 01/28/2021   Influenza Split 01/30/2009, 02/07/2010   Influenza, High Dose Seasonal PF 12/17/2011, 01/14/2017   Influenza,inj,Quad PF,6+ Mos 02/17/2016, 01/19/2018   Influenza,inj,quad, With Preservative 12/01/2018   Influenza,trivalent, recombinat, inj, PF 01/02/2011   Influenza-Unspecified 03/11/2015, 12/01/2018   PFIZER(Purple Top)SARS-COV-2 Vaccination 04/22/2019, 05/13/2019, 12/13/2019   PPD Test 08/04/2008   Pfizer Covid-19 Vaccine Bivalent Booster 56yr & up 10/31/2020   Pneumococcal Conjugate-13 02/07/2010, 03/26/2014   Pneumococcal Polysaccharide-23 02/07/2010   Td 04/04/2012   Tdap 03/26/2006   Zoster, Live 03/30/2009    Conditions to be addressed/monitored:  Hypertension, Hyperlipidemia, and Diabetes  Care Plan : [place]  Updates made by Debbora Dus, [facility] since 04/01/2021 12:00 AM     Problem: CHL AMB "PATIENT-SPECIFIC PROBLEM"      Long-Range Goal: Disease Management   Start Date: 06/04/2020  Recent Progress: Not on track  Priority: High  Note:   Current Barriers:   Unable to achieve control of diabetes    Pharmacist Clinical Goal(s):  Over the next 30 days, patient will adhere to plan to optimize therapeutic regimen for diabetes as evidenced by report of adherence to recommended medication management changes through collaboration with PharmD and provider.   Interventions: 1:1 collaboration with Tonia Ghent, MD regarding development and update of comprehensive plan of care as evidenced by provider attestation and co-signature Inter-disciplinary care team collaboration (see longitudinal plan of care) Comprehensive medication review performed; medication list updated in electronic medical record  Hypertension (BP goal <140/90) -Controlled -Current treatment: Losartan 100 mg - 1 tablet daily - appropriate, effective,  safe, accessible Metoprolol Succinate ER - 1 tablet daily  - appropriate, effective,  safe, accessible Amlodipine 10 mg - 1 tablet daily - appropriate, effective,  safe, accessible -Medications previously tried: none reported  -Current home readings: none reported -Denies hypotensive/hypertensive symptoms -Adherence reviewed, no gaps in refills for 2022 -Recommended to continue current medication  Hyperlipidemia: (LDL goal < 70) -Not ideally controlled - Last lipid panel 2021, TG elevated 484;  Poor adherence per refill history for statin/gemfibrozil 2022. -Current treatment: Atorvastatin 10 mg - 1 tablet daily  - appropriate, query effective Gemfibrozil 600 mg - 1 tablet BID -  appropriate, query effective Aspirin 81 mg - 1 tablet daily - appropriate, effective,  safe, accessible -Medications previously tried: none - No history of statin intolerance. TG were 943 in 2017.  - TG likely elevated due to uncontrolled diabetes/adherence.  -Recommended to continue current medication; Recommend daily adherence to atorvastatin and improved diabetes control. Will have team call for statin adherence.  Diabetes (A1c goal  <8%) -Uncontrolled - A1c 11.9% - Reviewed chart for BG logs from Colletta Maryland, pt's daughter. Per patient, she lives in [place] and visited for Christmas. BG was elevated over the holidays but per pt it has improved some with novolog. Daughter was able to get him back on track with home BG monitoring and taking the novolog correctly. He is wearing his Elenor Legato today but unable to provide log. Reports his wife keeps the [place] app on her phone but she was not available today. He reports taking Novolog 10 units 2-3 times per day before meals in addition to his Toujeo 175 units daily. He also received Trulicity 4.5 mg in mail and started the new dose. -Pt has initial appointment with Dr. Kelton Pillar, MD 04/11/20 -Current medications: Farxiga 10 mg - 1 tablet daily - appropriate, query effective Toujeo Max Solostar 300 unit/mL - 175 units every morning - appropriate, query effective Trulicity 4.5 mg - Inject once weekly on Sundays - appropriate, query effective Novolog - Inject 10 units before meals (pt self increased to TID before meals) - appropriate, query effective -Medications previously tried: Januvia (replaced with Trulicity), Metformin (GI intolerance) -Current home glucose readings - Freestyle Libre Fasting ranging: mostly 200s all day, lowest reading 220 -Denies hypoglycemic/hyperglycemic symptoms -Current meal patterns: see nutrition notes -Current exercise: limited due to back pain -He is still motivated to lower BG in order to proceed with back surgery.  -Recommend: Increase Novolog to 12 units before meals. May increase further to 15 units before meals  if BG remains above 200.   Patient Goals/Self-Care Activities Over the next 30 days, patient will:  - check glucose before meals and 2 hours after meals with Elenor Legato - bring [place] reader (phone) to all appointments  Follow Up Plan: CPP visit - 30 days CMA will call patient tomorrow to remind him to bring [employer] paperwork to PCP visit this  week.  Medication Assistance: Trulicity 4.5 mg obtained through [employer] medication assistance program.  Enrollment ends 03/29/21. Re-enrollment mailed to patient 03/19/21. Pending patient completing forms for renewal.    Compliance/Adherence/Medication fill history: Care Gaps: Diabetic eye exam due  Star Rating Drugs: Medication:                Last Fill:         Day Supply Atorvastatin 10 mg       10/21/20            90 Farxiga 10 mg              02/24/21           30 Losartan 100 mg          12/14/20            90  Statin past due for refill. Contacted pharmacy to verify fill dates. Per pharmacy,  last fill date is accurate (10/21/20, 90DS). They offer auto-refill if he agrees.   Patient's preferred pharmacy is:  [facility], [place] - [address] [place] [address] Phone: [phone] Fax: [phone]   Uses pill box? Yes Pt endorses 100% compliance  Care Plan and Follow Up Patient Decision:  Patient agrees to Care Plan and Follow-up.  Debbora Dus, PharmD Clinical Pharmacist [facility] at [facility] [phone]

## 2021-04-01 NOTE — Telephone Encounter (Signed)
Trulicity 4.5 mg application for renewal 2023 received in office by Anastasiya. She will fax to [employer] once signed by PCP.

## 2021-04-01 NOTE — Patient Instructions (Signed)
Dear [name],  Below is a summary of the goals we discussed during our follow up appointment on April 01, 2021. Please contact me anytime with questions or concerns.   Visit Information  Patient Care Plan: CCM Pharmacy Care Plan     Problem Identified: CHL AMB "PATIENT-SPECIFIC PROBLEM"      Long-Range Goal: Disease Management   Start Date: 06/04/2020  Recent Progress: Not on track  Priority: High  Note:   Current Barriers:  Unable to achieve control of diabetes    Pharmacist Clinical Goal(s):  Over the next 30 days, patient will adhere to plan to optimize therapeutic regimen for diabetes as evidenced by report of adherence to recommended medication management changes through collaboration with PharmD and provider.   Interventions: 1:1 collaboration with Tonia Ghent, MD regarding development and update of comprehensive plan of care as evidenced by provider attestation and co-signature Inter-disciplinary care team collaboration (see longitudinal plan of care) Comprehensive medication review performed; medication list updated in electronic medical record  Hypertension (BP goal <140/90) -Controlled -Current treatment: Losartan 100 mg - 1 tablet daily - appropriate, effective,  safe, accessible Metoprolol Succinate ER - 1 tablet daily  - appropriate, effective,  safe, accessible Amlodipine 10 mg - 1 tablet daily - appropriate, effective,  safe, accessible -Medications previously tried: none reported  -Current home readings: none reported -Denies hypotensive/hypertensive symptoms -Adherence reviewed, no gaps in refills for 2022 -Recommended to continue current medication  Hyperlipidemia: (LDL goal < 70) -Not ideally controlled - Last lipid panel 2021, TG elevated 484;  Poor adherence per refill history for statin/gemfibrozil 2022. -Current treatment: Atorvastatin 10 mg - 1 tablet daily  - appropriate, query effective Gemfibrozil 600 mg - 1 tablet BID -  appropriate,  query effective Aspirin 81 mg - 1 tablet daily - appropriate, effective,  safe, accessible -Medications previously tried: none - No history of statin intolerance. TG were 943 in 2017.  - TG likely elevated due to uncontrolled diabetes/adherence.  -Recommended to continue current medication; Recommend daily adherence to atorvastatin and improved diabetes control. Will have team call for statin adherence.  Diabetes (A1c goal <8%) -Uncontrolled - A1c 11.9% - Reviewed chart for BG logs from Colletta Maryland, pt's daughter. Per patient, she lives in [place] and visited for Christmas. BG was elevated over the holidays but per pt it has improved some with novolog. Daughter was able to get him back on track with home BG monitoring and taking the novolog correctly. He is wearing his Elenor Legato today but unable to provide log. Reports his wife keeps the [place] app on her phone but she was not available today. He reports taking Novolog 10 units 2-3 times per day before meals in addition to his Toujeo 175 units daily. He also received Trulicity 4.5 mg in mail and started the new dose. -Pt has initial appointment with Dr. Kelton Pillar, MD 04/11/20 -Current medications: Farxiga 10 mg - 1 tablet daily - appropriate, query effective Toujeo Max Solostar 300 unit/mL - 175 units every morning - appropriate, query effective Trulicity 4.5 mg - Inject once weekly on Sundays - appropriate, query effective Novolog - Inject 10 units before meals (pt self increased to TID before meals) - appropriate, query effective -Medications previously tried: Januvia (replaced with Trulicity), Metformin (GI intolerance) -Current home glucose readings - Freestyle Libre Fasting ranging: mostly 200s all day, lowest reading 220 -Denies hypoglycemic/hyperglycemic symptoms -Current meal patterns: see nutrition notes -Current exercise: limited due to back pain -He is still motivated to lower  BG in order to proceed with back surgery.  -Recommend: Increase  Novolog to 12 units before meals. May increase further to 15 units before meals if BG remains above 200.   Patient Goals/Self-Care Activities Over the next 30 days, patient will:  - check glucose before meals and 2 hours after meals with Elenor Legato - bring [place] reader (phone) to all appointments  Follow Up Plan: CPP visit - 30 days CMA will call patient tomorrow to remind him to bring [employer] paperwork to PCP visit this week.  Medication Assistance: Trulicity 4.5 mg obtained through [employer] medication assistance program.  Enrollment ends 03/29/21. Re-enrollment mailed to patient 03/19/21. Pending patient completing forms for renewal.      Patient verbalizes understanding of instructions provided today and agrees to view in [place].   Debbora Dus, PharmD Clinical Pharmacist Practitioner [facility] at [facility] [phone]

## 2021-04-03 ENCOUNTER — Other Ambulatory Visit: Payer: Self-pay

## 2021-04-03 ENCOUNTER — Ambulatory Visit (INDEPENDENT_AMBULATORY_CARE_PROVIDER_SITE_OTHER): Payer: HMO | Admitting: Family Medicine

## 2021-04-03 ENCOUNTER — Encounter: Payer: Self-pay | Admitting: Family Medicine

## 2021-04-03 DIAGNOSIS — E1169 Type 2 diabetes mellitus with other specified complication: Secondary | ICD-10-CM

## 2021-04-03 DIAGNOSIS — E669 Obesity, unspecified: Secondary | ICD-10-CM | POA: Diagnosis not present

## 2021-04-03 DIAGNOSIS — R300 Dysuria: Secondary | ICD-10-CM

## 2021-04-03 NOTE — Telephone Encounter (Signed)
This has been done today as requested

## 2021-04-03 NOTE — Patient Instructions (Addendum)
Increase mealtime insulin to 15 units.   Ask endocrine about your AM dose of insulin- glargine- ask about splitting that up.    Try a funnel in the meantime in the bathroom and see if that helps.   I'll await the endocrine noted.  Take care.  Glad to see you.

## 2021-04-03 NOTE — Progress Notes (Signed)
This visit occurred during the SARS-CoV-2 public health emergency.  Safety protocols were in place, including screening questions prior to the visit, additional usage of staff PPE, and extensive cleaning of exam room while observing appropriate contact time as indicated for disinfecting solutions.  Urinary sx, he is able to get to the bathroom in time but has difficulty with aiming his stream.  D/w pt about condom catheters vs using a funnel vs sitting down to urinate. Urinary sx didn't improve with gemtesa.    Diabetes:  Using medications without difficulties: yes Hypoglycemic episodes: no Hyperglycemic episodes: see below.  Feet problems: occ tingling noted.  Blood Sugars averaging: 300s.  None below 200.   Recommendations: Increase Novolog before meals to 12 units, may increase further to 15 units if BG remains > 200. Take before all meals.   He is using meter to check his sugar, family is helping out.  He has endo f/u pending.   He is sleeping in a chair due to back pain.  D/w pt about diet his home situation.  His back surgery was delayed due to DM2.    Meds, vitals, and allergies reviewed.   ROS: Per HPI unless specifically indicated in ROS section   GEN: nad, alert and oriented HEENT: ncat NECK: supple w/o LA CV: rrr. PULM: ctab, no inc wob ABD: soft, +bs EXT: no edema SKIN: well perfused.  L 2nd finger nail with fungal changes.

## 2021-04-07 DIAGNOSIS — R300 Dysuria: Secondary | ICD-10-CM | POA: Insufficient documentation

## 2021-04-07 NOTE — Assessment & Plan Note (Signed)
This is likely contributing to his urinary symptoms.  He needs better glycemic control.  Discussed consolidating his intake into discrete meals and using mealtime insulin. Recommendations: Increase Novolog before meals to 12 units, may increase further to 15 units if BG remains > 200. Take before all meals.  He is going to follow-up with endocrinology.

## 2021-04-07 NOTE — Assessment & Plan Note (Signed)
Stopping [name] is likely not going to help because he would probably have resultant increase in sugar that would still cause dysuria.  Discussed using a funnel in the bathroom to help with aim.  See diabetes discussion. 33 minutes were devoted to patient care in this encounter (this includes time spent reviewing the patient's file/history, interviewing and examining the patient, counseling/reviewing plan with patient).

## 2021-04-08 ENCOUNTER — Other Ambulatory Visit: Payer: Self-pay

## 2021-04-08 ENCOUNTER — Encounter: Payer: HMO | Attending: Physician Assistant | Admitting: *Deleted

## 2021-04-08 ENCOUNTER — Encounter: Payer: Self-pay | Admitting: *Deleted

## 2021-04-08 ENCOUNTER — Other Ambulatory Visit: Payer: Self-pay | Admitting: Family Medicine

## 2021-04-08 VITALS — BP 120/76 | Wt 248.1 lb

## 2021-04-08 DIAGNOSIS — Z6833 Body mass index (BMI) 33.0-33.9, adult: Secondary | ICD-10-CM | POA: Insufficient documentation

## 2021-04-08 DIAGNOSIS — E1165 Type 2 diabetes mellitus with hyperglycemia: Secondary | ICD-10-CM

## 2021-04-08 DIAGNOSIS — E669 Obesity, unspecified: Secondary | ICD-10-CM | POA: Diagnosis not present

## 2021-04-08 DIAGNOSIS — E1169 Type 2 diabetes mellitus with other specified complication: Secondary | ICD-10-CM | POA: Insufficient documentation

## 2021-04-08 NOTE — Patient Instructions (Signed)
Check blood sugars before each meal, 2 hours after each meal, before bed and as needed with FreeStyle Libre Bring reader/phone to each MD appointment  Exercise: Walk as tolerated  Eat 3 meals day,   2  snacks a day Space meals 4-6 hours apart Don't skip meals Allow 2-3 hours between meals and snacks Avoid sugar sweetened drinks (tea)  Carry fast acting glucose (glucose tablets or peppermints) and a snack at all times  Rotate injection sites  Split insulin dose to 2 injections (don't give all 175 units into one area)

## 2021-04-08 NOTE — Progress Notes (Signed)
Diabetes Self-Management Education  Visit Type: Follow-up  Appt. Start Time: 1330 Appt. End Time: 5631  04/08/2021  [name], identified by name and date of birth, is a 81 y.o. male with a diagnosis of Diabetes: Type 2.   ASSESSMENT  Blood pressure 120/76, weight 248 lb 1.6 oz (112.5 kg). Body mass index is 33.65 kg/m.   Diabetes Self-Management Education - 04/08/21 1555       Visit Information   Visit Type Follow-up      Initial Visit   Diabetes Type Type 2      Complications   How often do you check your blood sugar? 1-2 times/day   Per FreeStyle app he is only checking 2 x day.   Have you had a dilated eye exam in the past 12 months? Yes    Have you had a dental exam in the past 12 months? Yes    Are you checking your feet? Yes    How many days per week are you checking your feet? 7      Dietary Intake   Breakfast 2-3 meals/day    Snack (morning) 3-4 snacks/day      Exercise   Exercise Type ADL's      Patient Education   Disease state  Explored patient's options for treatment of their diabetes    Nutrition management  Role of diet in the treatment of diabetes and the relationship between the three main macronutrients and blood glucose level;Food label reading, portion sizes and measuring food.;Reviewed blood glucose goals for pre and post meals and how to evaluate the patients' food intake on their blood glucose level.;Meal timing in regards to the patients' current diabetes medication.    Physical activity and exercise  Role of exercise on diabetes management, blood pressure control and cardiac health.    Medications Taught/reviewed insulin injection, site rotation, insulin storage and needle disposal.;Reviewed patients medication for diabetes, action, purpose, timing of dose and side effects.   Pt has started meal-time insulin. He has still giving Toujeo 175 units in one area of his leg.   Monitoring Purpose and frequency of SMBG.;Taught/discussed recording of  test results and interpretation of SMBG.;Identified appropriate SMBG and/or A1C goals.   Wife reports difficulty with current FreeStyle sensor. Message that reads incompatable. She brought box from pharmacy. They gave patient FreeStyle Libre 1. He has been using Libre 2. Wife has [place] 2 app on her phone. She will return to pharmacy.   Acute complications Taught treatment of hypoglycemia - the 15 rule.   He didn't bring any glucose tabs or peppermints and snack with him.   Chronic complications Relationship between chronic complications and blood glucose control    Psychosocial adjustment Role of stress on diabetes;Other (comment)   Role of pain on BG control     Individualized Goals (developed by patient)   Nutrition Follow meal plan discussed;Other (comment)   don't skip meals, allow 2-3 hours between meals and snacks, avoid sugar sweetened tea   Physical Activity --   walk as tolerated   Medications Other (comment)   split insulin dose (don't give 175 units to one area)   Monitoring  test my blood glucose as discussed    Reducing Risk examine blood glucose patterns      Post-Education Assessment   Patient understands the diabetes disease and treatment process. Demonstrates understanding / competency    Patient understands incorporating nutritional management into lifestyle. Needs Review    Patient undertands incorporating physical activity into  lifestyle. Needs Review    Patient understands using medications safely. Needs Review    Patient understands monitoring blood glucose, interpreting and using results Needs Review    Patient understands prevention, detection, and treatment of acute complications. Needs Review    Patient understands prevention, detection, and treatment of chronic complications. Demonstrates understanding / competency    Patient understands how to develop strategies to address psychosocial issues. Demonstrates understanding / competency    Patient understands how to  develop strategies to promote health/change behavior. Needs Review      Outcomes   Expected Outcomes Other (comment)   Demonstrated interest in learning. Expect minimal changes by patient but wife is trying to achieve positive outcomes.   Program Status Completed      Subsequent Visit   Since your last visit have you continued or begun to take your medications as prescribed? Yes   still is not splitting insulin dosage   Since your last visit have you had your blood pressure checked? Yes    Is your most recent blood pressure lower, unchanged, or higher since your last visit? Lower    Since your last visit have you experienced any weight changes? Gain    Weight Gain (lbs) 4    Since your last visit, are you checking your blood glucose at least once a day? Yes        Individualized Plan for Diabetes Self-Management Training:   Learning Objective:  Patient will have a greater understanding of diabetes self-management. Patient education plan is to attend individual and/or group sessions per assessed needs and concerns.   Plan:   Patient Instructions  Check blood sugars before each meal, 2 hours after each meal, before bed and as needed with FreeStyle Libre Bring reader/phone to each MD appointment  Exercise: Walk as tolerated  Eat 3 meals day,   2  snacks a day Space meals 4-6 hours apart Don't skip meals Allow 2-3 hours between meals and snacks Avoid sugar sweetened drinks (tea)  Carry fast acting glucose (glucose tablets or peppermints) and a snack at all times  Rotate injection sites  Split insulin dose to 2 injections (don't give all 175 units into one area)  Expected Outcomes:  Demonstrated interest in learning. Expect minimal changes by patient but wife is trying to achieve positive outcomes.  Education material provided:  Metallurgist Guidelines Simple Meal Plan  Planning a Balanced Meal Quick and Balanced Meals Healthy Snack Choices (ADA) Summary of History  Options for Freestyle Libre 2 app  If problems or questions, patient to contact team via:   Johny Drilling, RN, [place] [phone]  Future DSME appointment: PRN

## 2021-04-10 NOTE — Progress Notes (Signed)
Name: [name]  MRN/ DOB: [id number], 1941-03-24   Age/ Sex: 81 y.o., male    PCP: Tonia Ghent, MD   Reason for Endocrinology Evaluation: Type 2 Diabetes Mellitus     Date of Initial Endocrinology Visit: 04/11/2021     PATIENT IDENTIFIER: [name] is a 81 y.o. male with a past medical history of T2DM, HTN and Dyslipidemia . The patient presented for initial endocrinology clinic visit on 04/11/2021 for consultative assistance with his diabetes management.    HPI: Mr. Grieser was    Diagnosed with DM yrs ago Prior Medications tried/Intolerance: INtolerant to Metformin  Currently checking blood sugars multiple  x / day, through Freestyle libre  Hypoglycemia episodes : no               Hemoglobin A1c has ranged from 7.7% in 2019, peaking at 11.9% in 2022. Patient required assistance for hypoglycemia: no   In terms of diet, the patient eats 2-3 meals a day, grazed through the day . Avoids-sugar sweetened beverages   Back sx delayed due to hyperglycemia   HOME DIABETES REGIMEN: Trulicity 4.5 mg weekly  Farxiga 10 mg daily  Toujeo 175 mg daily  Novolog 15 units with each meal    Statin: yes ACE-I/ARB: yes Prior Diabetic Education: yes   CGM: Unable to download freestyle libre 2   DIABETIC COMPLICATIONS: Microvascular complications:  CKD III Denies: neuropathy  Last eye exam: Completed 2022 - Dr. Katy Fitch   Macrovascular complications:   Denies: CAD, PVD, CVA   PAST HISTORY: Past Medical History:  Past Medical History:  Diagnosis Date   Arthritis    arthirtis all over   Bell's palsy    Cataract 2020   bilateral eyes -pending surgery June 2020   Concussion    multiple- college football player   Diabetes mellitus type 2 in obese ([place])    Frequent urination at night    GERD (gastroesophageal reflux disease)    occ indigestion   Glaucoma    both eyes   Hyperlipemia    Hypertension    Kidney stones    PONV (postoperative nausea and  vomiting)    Primary localized osteoarthritis of right hip    Primary osteoarthritis of right knee    Restless leg syndrome    Right knee DJD    Spinal stenosis of lumbar region with radiculopathy    s/p L4/L5 transforaminal blocks   Past Surgical History:  Past Surgical History:  Procedure Laterality Date   ABDOMINAL HERNIA REPAIR     ANTERIOR HIP REVISION Right 01/08/2015   Procedure: RIGHT ANTERIOR HIP REVISION;  Surgeon: Renette Butters, MD;  Location: [facility];  Service: Orthopedics;  Laterality: Right;   BACK SURGERY     CANTHOPLASTY Right 02/09/2017   Procedure: RIGHT LATERAL CANTHOPLASTY;  Surgeon: Irene Limbo, MD;  Location: [facility];  Service: Plastics;  Laterality: Right;   CANTHOPLASTY Left 06/29/2017   Procedure: LEFT LOWER CANTHOPLASTY;  Surgeon: Irene Limbo, MD;  Location: [facility];  Service: Plastics;  Laterality: Left;   CATARACT EXTRACTION Left    COLONOSCOPY     ELBOW ARTHROSCOPY  1990   right   ELBOW SURGERY     right   HERNIA REPAIR  1980   inguinal   HIP CLOSED REDUCTION Right 11/09/2014   Procedure: CLOSED REDUCTION HIP, s/p total hip 8/9;  Surgeon: Ninetta Lights, MD;  Location: [facility];  Service: Orthopedics;  Laterality: Right;  JOINT REPLACEMENT  2008   knee   LITHOTRIPSY     LUMBAR DISC SURGERY     2015   RECONSTRUCTION OF EYELID     REVISION TOTAL HIP ARTHROPLASTY Right 01/08/2015   SHOULDER ARTHROSCOPY  1980   right   SHOULDER SURGERY     right   STONE EXTRACTION WITH BASKET  2010   TOTAL HIP ARTHROPLASTY Right 11/06/2014   Procedure: RIGHT TOTAL HIP ARTHROPLASTY ANTERIOR APPROACH;  Surgeon: Renette Butters, MD;  Location: [facility];  Service: Orthopedics;  Laterality: Right;   TOTAL KNEE ARTHROPLASTY Left 07/2006   left knee   TOTAL KNEE ARTHROPLASTY Right 01/08/2014   Procedure: RIGHT TOTAL KNEE ARTHROPLASTY;  Surgeon: Lorn Junes, MD;  Location: [facility];  Service: Orthopedics;  Laterality: Right;     Social History:  reports that he has never smoked. He has never used smokeless tobacco. He reports current alcohol use. He reports that he does not use drugs. Family History:  Family History  Problem Relation Age of Onset   Hypertension Mother    Kidney disease Mother    Hypertension Father    Diabetes Mellitus II Father    Colon polyps Father    Diabetes Mellitus II Brother    Diabetes Mellitus II Brother    Prostate cancer Brother    Colon cancer Neg Hx      HOME MEDICATIONS: Allergies as of 04/11/2021       Reactions   Morphine And Related Nausea And Vomiting   Causes nausea & vomiting   Codeine Sulfate Other (See Comments)   intolerant   Metformin And Related Other (See Comments)   Gi intolerance, not an allergy        Medication List        Accurate as of April 11, 2021  3:03 PM. If you have any questions, ask your nurse or doctor.          allopurinol 300 MG tablet Commonly known as: ZYLOPRIM Take 0.5 tablets (150 mg total) by mouth every morning.   amLODipine 10 MG tablet Commonly known as: NORVASC TAKE 1 TABLET BY MOUTH DAILY   aspirin 81 MG tablet Take 81 mg by mouth daily.   atorvastatin 10 MG tablet Commonly known as: LIPITOR TAKE 1 TABLET BY MOUTH EACH EVENING   blood glucose meter kit and supplies Dispense based on patient and insurance preference. Use up to four times daily as directed. (FOR ICD-10 E10.9, E11.9).   Citrucel oral powder Generic drug: methylcellulose Take 1 packet by mouth daily.   dapagliflozin propanediol 10 MG Tabs tablet Commonly known as: Farxiga Take 1 tablet (10 mg total) by mouth daily before breakfast. What changed: how much to take Changed by: Dorita Sciara, MD   diclofenac sodium 1 % Gel Commonly known as: VOLTAREN Uses PRN   dorzolamide-timolol 22.3-6.8 MG/ML ophthalmic solution Commonly known as: COSOPT Place 1 drop into the left eye 2 (two) times daily.   FreeStyle Libre 2 Reader Kerrin Mo USE  TO CHECK BLOOD SUGAR DAILY   FreeStyle Libre 2 Sensor Misc USE AS DIRECTED BY PROVIDER   gemfibrozil 600 MG tablet Commonly known as: LOPID Take 1 tablet (600 mg total) by mouth 2 (two) times daily.   glucose blood test strip Use as instructed to check blood glucose twice daily   insulin aspart 100 UNIT/ML FlexPen Commonly known as: NOVOLOG Max daily 50 units What changed:  how much to take how to take this when to  take this additional instructions Changed by: Dorita Sciara, MD   Insulin Pen Needle 31G X 5 MM Misc 1 Device by Does not apply route in the morning, at noon, in the evening, and at bedtime. What changed:  how much to take when to take this additional instructions Changed by: Dorita Sciara, MD   latanoprost 0.005 % ophthalmic solution Commonly known as: XALATAN Place 1 drop into both eyes at bedtime.   losartan 100 MG tablet Commonly known as: COZAAR TAKE 1 TABLET BY MOUTH DAILY   meloxicam 15 MG tablet Commonly known as: MOBIC TAKE 1/2 TO 1 TABLET BY MOUTH DAILY AS NEEDED FOR PAIN...TAKE WITH FOOD   metoprolol succinate 25 MG 24 hr tablet Commonly known as: TOPROL-XL TAKE 1 TABLET BY MOUTH DAILY   mupirocin ointment 2 % Commonly known as: BACTROBAN Apply 1 application topically 2 (two) times daily.   Tadalafil 2.5 MG Tabs TAKE 1 TABLET BY MOUTH DAILY   Toujeo Max SoloStar 300 UNIT/ML Solostar Pen Generic drug: insulin glargine (2 Unit Dial) Inject 200 Units into the skin daily in the afternoon. 175 units injected in the AM What changed:  how much to take how to take this when to take this Changed by: Dorita Sciara, MD   Trulicity 4.5 KD/9.8PJ Sopn Generic drug: Dulaglutide Inject 4.5 mg as directed once a week.   vitamin C with rose hips 500 MG tablet Take 500 mg by mouth daily.   VITRUM 50+ SENIOR MULTI PO Take 1 tablet by mouth daily.         ALLERGIES: Allergies  Allergen Reactions   Morphine And  Related Nausea And Vomiting    Causes nausea & vomiting   Codeine Sulfate Other (See Comments)    intolerant   Metformin And Related Other (See Comments)    Gi intolerance, not an allergy     REVIEW OF SYSTEMS: A comprehensive ROS was conducted with the patient and is negative except as per HPI and below:  Review of Systems  Gastrointestinal:  Negative for diarrhea, nausea and vomiting.     OBJECTIVE:   VITAL SIGNS: BP 140/82 (BP Location: Left Arm, Patient Position: Sitting, Cuff Size: Large)    Pulse 94    Ht 6' (1.829 m)    Wt 247 lb (112 kg)    SpO2 95%    BMI 33.50 kg/m    PHYSICAL EXAM:  General: Pt appears well and is in NAD  Neck: General: Supple without adenopathy or carotid bruits. Thyroid: Thyroid size normal.  No goiter or nodules appreciated.   Lungs: Clear with good BS bilat with no rales, rhonchi, or wheezes  Heart: RRR with normal S1 and S2 and no gallops; no murmurs; no rub  Extremities:  Lower extremities - No pretibial edema. No lesions.  Neuro: MS is good with appropriate affect, pt is alert and Ox3    DATA REVIEWED:  Lab Results  Component Value Date   HGBA1C 10.9 (A) 04/11/2021   HGBA1C 11.9 (A) 01/28/2021   HGBA1C 10.0 (A) 10/10/2020   Lab Results  Component Value Date   LDLCALC 82 05/09/2014   CREATININE 1.37 12/13/2020   No results found for: [facility]  Lab Results  Component Value Date   CHOL 179 07/17/2019   HDL 37.70 (L) 07/17/2019   LDLCALC 82 05/09/2014   LDLDIRECT 57.0 07/17/2019   TRIG (H) 07/17/2019    484.0 Triglyceride is over 400; calculations on Lipids are invalid.   CHOLHDL 5  07/17/2019        ASSESSMENT / PLAN / RECOMMENDATIONS:   1) Type 2 Diabetes Mellitus, Poorly controlled, With CKD III complications - Most recent A1c of 10.9 %. Goal A1c < 8.0 %.    -Poorly controlled diabetes due to dietary indiscretion as well as medication non adherence (patient does not take prandial insulin with each meal) Discussed  pharmacokinetics of basal/bolus insulin and the importance of taking prandial insulin with meals.  We also discussed avoiding sugar-sweetened beverages and snacks, when possible.  -Patient is pending back surgery and A1c needs to be optimized -I will increase his insulin as below and will provide him with a correction scale to be used before each meal if needed  MEDICATIONS: Continue Trulicity 4.5 mg weekly  Continue Farxiga 10 mg, 1 tablet daily   Increase Toujeo to 200 units  daily  Continue Novolog 15 units WITH each meal  Correction factor (BG -130/25)  EDUCATION / INSTRUCTIONS: BG monitoring instructions: Patient is instructed to check his blood sugars 3 times a day, before each meal. Call [facility] if: BG persistently < 70  I reviewed the Rule of 15 for the treatment of hypoglycemia in detail with the patient. Literature supplied.   2) Diabetic complications:  Eye: Does not have known diabetic retinopathy.  Neuro/ Feet: Does not have known diabetic peripheral neuropathy. Renal: Patient does  have known baseline CKD. He is  on an ACEI/ARB at present.  3) Dyslipidemia:   -Triglycerides elevated at 484 mg/DL, this increases the risk of pancreatitis -We discussed importance of glycemic control and reducing CHO intake as possible ways and improving TG levels  -The patient is on atorvastatin 10 mg daily as well as gemfibrozil 600 mg twice daily, will need to keep close monitoring due to increased risk of side effects with a combination of gemfibrozil and statin therapy      Signed electronically by: Mack Guise, MD  [facility]  [facility] [address] [place], [place] [address] Phone: [phone] FAX: [phone]   CC: Tonia Ghent, MD [place] [address] Phone: [phone]  Fax: [phone]    Return to Endocrinology clinic as below: Future Appointments  Date Time  Provider [place]  05/06/2021  3:00 PM [facility] PHARMACIST [facility]  05/23/2021  9:30 AM Briony Parveen, Melanie Crazier, MD [facility] None

## 2021-04-11 ENCOUNTER — Ambulatory Visit (INDEPENDENT_AMBULATORY_CARE_PROVIDER_SITE_OTHER): Payer: HMO | Admitting: Internal Medicine

## 2021-04-11 ENCOUNTER — Encounter: Payer: Self-pay | Admitting: Internal Medicine

## 2021-04-11 ENCOUNTER — Other Ambulatory Visit: Payer: Self-pay

## 2021-04-11 VITALS — BP 140/82 | HR 94 | Ht 72.0 in | Wt 247.0 lb

## 2021-04-11 DIAGNOSIS — Z794 Long term (current) use of insulin: Secondary | ICD-10-CM | POA: Diagnosis not present

## 2021-04-11 DIAGNOSIS — E1169 Type 2 diabetes mellitus with other specified complication: Secondary | ICD-10-CM

## 2021-04-11 DIAGNOSIS — E1122 Type 2 diabetes mellitus with diabetic chronic kidney disease: Secondary | ICD-10-CM | POA: Diagnosis not present

## 2021-04-11 DIAGNOSIS — E785 Hyperlipidemia, unspecified: Secondary | ICD-10-CM

## 2021-04-11 DIAGNOSIS — R739 Hyperglycemia, unspecified: Secondary | ICD-10-CM | POA: Diagnosis not present

## 2021-04-11 DIAGNOSIS — N1831 Chronic kidney disease, stage 3a: Secondary | ICD-10-CM | POA: Diagnosis not present

## 2021-04-11 DIAGNOSIS — E669 Obesity, unspecified: Secondary | ICD-10-CM | POA: Diagnosis not present

## 2021-04-11 LAB — POCT GLYCOSYLATED HEMOGLOBIN (HGB A1C): Hemoglobin A1C: 10.9 % — AB (ref 4.0–5.6)

## 2021-04-11 MED ORDER — INSULIN ASPART 100 UNIT/ML FLEXPEN: PEN_INJECTOR | SUBCUTANEOUS | 3 refills | Status: DC

## 2021-04-11 MED ORDER — DAPAGLIFLOZIN PROPANEDIOL 10 MG PO TABS: 10.0000 mg | ORAL_TABLET | Freq: Every day | ORAL | 3 refills | Status: AC

## 2021-04-11 MED ORDER — TOUJEO MAX SOLOSTAR 300 UNIT/ML ~~LOC~~ SOPN: 200.0000 [IU] | PEN_INJECTOR | Freq: Every day | SUBCUTANEOUS | 3 refills | Status: DC

## 2021-04-11 MED ORDER — INSULIN PEN NEEDLE 31G X 5 MM MISC: 1.0000 | Freq: Four times a day (QID) | 3 refills | Status: AC

## 2021-04-11 MED ORDER — TRULICITY 4.5 MG/0.5ML ~~LOC~~ SOAJ: 4.5000 mg | SUBCUTANEOUS | 3 refills | Status: AC

## 2021-04-11 NOTE — Patient Instructions (Addendum)
-   Continue Trulicity 4.5 mg weekly  - Continue Farxiga 10 mg, 1 tablet daily  - Increase Toujeo to 200 units  daily  - Continue Novolog 15 units WITH each meal  -Novolog correctional insulin: ADD extra units on insulin to your meal-time Novolog  dose if your blood sugars are higher than 155. Use the scale below to help guide you:   Blood sugar before meal Number of units to inject  Less than 155 0 unit  156 -  180 1 units  181 -  205 2 units  206 -  230 3 units  231 -  255 4 units  256 -  280 5 units  281 -  305 6 units  306 -  330 7 units  331 -  355 8 units  356 - 380 9 units   381 - 405 10 units     HOW TO TREAT LOW BLOOD SUGARS (Blood sugar LESS THAN 70 MG/DL) Please follow the RULE OF 15 for the treatment of hypoglycemia treatment (when your (blood sugars are less than 70 mg/dL)   STEP 1: Take 15 grams of carbohydrates when your blood sugar is low, which includes:  3-4 GLUCOSE TABS  OR 3-4 OZ OF JUICE OR REGULAR SODA OR ONE TUBE OF GLUCOSE GEL    STEP 2: RECHECK blood sugar in 15 MINUTES STEP 3: If your blood sugar is still low at the 15 minute recheck --> then, go back to STEP 1 and treat AGAIN with another 15 grams of carbohydrates.

## 2021-04-29 DIAGNOSIS — E1169 Type 2 diabetes mellitus with other specified complication: Secondary | ICD-10-CM | POA: Diagnosis not present

## 2021-04-29 DIAGNOSIS — E785 Hyperlipidemia, unspecified: Secondary | ICD-10-CM | POA: Diagnosis not present

## 2021-04-29 DIAGNOSIS — I10 Essential (primary) hypertension: Secondary | ICD-10-CM

## 2021-04-29 DIAGNOSIS — E669 Obesity, unspecified: Secondary | ICD-10-CM

## 2021-04-30 ENCOUNTER — Other Ambulatory Visit: Payer: Self-pay | Admitting: Family Medicine

## 2021-05-01 ENCOUNTER — Telehealth: Payer: Self-pay

## 2021-05-01 NOTE — Chronic Care Management (AMB) (Signed)
Chronic Care Management Pharmacy Assistant   Name: [name]  MRN: [id number] DOB: 1941/02/11   Reason for Encounter:Reminder Call   Conditions to be addressed/monitored: HTN, HLD, and DMII    Medications: Outpatient Encounter Medications as of 05/01/2021  Medication Sig   allopurinol (ZYLOPRIM) 300 MG tablet Take 0.5 tablets (150 mg total) by mouth every morning.   amLODipine (NORVASC) 10 MG tablet TAKE 1 TABLET BY MOUTH DAILY   Ascorbic Acid (VITAMIN C WITH ROSE HIPS) 500 MG tablet Take 500 mg by mouth daily.   aspirin 81 MG tablet Take 81 mg by mouth daily.   atorvastatin (LIPITOR) 10 MG tablet TAKE 1 TABLET BY MOUTH EACH EVENING   blood glucose meter kit and supplies Dispense based on patient and insurance preference. Use up to four times daily as directed. (FOR ICD-10 E10.9, E11.9).   Continuous Blood Gluc Receiver (FREESTYLE LIBRE 2 READER) DEVI USE TO CHECK BLOOD SUGAR DAILY   Continuous Blood Gluc Sensor (FREESTYLE LIBRE 2 SENSOR) MISC USE AS DIRECTED BY PROVIDER   dapagliflozin propanediol (FARXIGA) 10 MG TABS tablet Take 1 tablet (10 mg total) by mouth daily before breakfast.   diclofenac sodium (VOLTAREN) 1 % GEL Uses PRN   dorzolamide-timolol (COSOPT) 22.3-6.8 MG/ML ophthalmic solution Place 1 drop into the left eye 2 (two) times daily.    Dulaglutide (TRULICITY) 4.5 JJ/9.4RD SOPN Inject 4.5 mg as directed once a week.   gemfibrozil (LOPID) 600 MG tablet Take 1 tablet (600 mg total) by mouth 2 (two) times daily.   glucose blood test strip Use as instructed to check blood glucose twice daily   insulin aspart (NOVOLOG) 100 UNIT/ML FlexPen Max daily 50 units   insulin glargine, 2 Unit Dial, (TOUJEO MAX SOLOSTAR) 300 UNIT/ML Solostar Pen Inject 200 Units into the skin daily in the afternoon. 175 units injected in the AM   Insulin Pen Needle 31G X 5 MM MISC 1 Device by Does not apply route in the morning, at noon, in the evening, and at bedtime.   latanoprost (XALATAN)  0.005 % ophthalmic solution Place 1 drop into both eyes at bedtime.   losartan (COZAAR) 100 MG tablet TAKE 1 TABLET BY MOUTH DAILY   meloxicam (MOBIC) 15 MG tablet TAKE 1/2 TO 1 TABLET BY MOUTH DAILY AS NEEDED FOR PAIN...TAKE WITH FOOD   methylcellulose (CITRUCEL) oral powder Take 1 packet by mouth daily.   metoprolol succinate (TOPROL-XL) 25 MG 24 hr tablet TAKE 1 TABLET BY MOUTH DAILY   Multiple Vitamins-Minerals (VITRUM 50+ SENIOR MULTI PO) Take 1 tablet by mouth daily.    mupirocin ointment (BACTROBAN) 2 % Apply 1 application topically 2 (two) times daily.   Tadalafil 2.5 MG TABS TAKE 1 TABLET BY MOUTH DAILY   No facility-administered encounter medications on file as of 05/01/2021.   [name]  did not answer the telephone to remind of upcoming telephone visit with Debbora Dus on 05/06/21 at 3:00pm. Patient was reminded to have all medications, supplements and any blood glucose and blood pressure readings available for review at appointment. If unable to reach, a voicemail was left for patient.     Star Rating Drugs: Medication:  Last Fill: Day Supply Farxiga 19m  140/81/4430 Trulicity 48.1EH Gets from manufacturer Novolog 100unit/ml 03/05/21 90 Toujeo 300unit/ml 01/20/21 30 Losartan 1046m9/17/22 90 Atorvastatin 107m/25/22 90 [place]  Vel[place] SensorCM[place] Pointsistant [phone] time spent for month CPA:  min °

## 2021-05-02 ENCOUNTER — Telehealth: Payer: Self-pay

## 2021-05-02 NOTE — Telephone Encounter (Signed)
Paperwork faxed to [facility]

## 2021-05-06 ENCOUNTER — Telehealth: Payer: Self-pay

## 2021-05-06 ENCOUNTER — Ambulatory Visit (INDEPENDENT_AMBULATORY_CARE_PROVIDER_SITE_OTHER): Payer: HMO

## 2021-05-06 ENCOUNTER — Other Ambulatory Visit: Payer: Self-pay

## 2021-05-06 ENCOUNTER — Other Ambulatory Visit: Payer: Self-pay | Admitting: Family Medicine

## 2021-05-06 DIAGNOSIS — E1169 Type 2 diabetes mellitus with other specified complication: Secondary | ICD-10-CM

## 2021-05-06 DIAGNOSIS — E785 Hyperlipidemia, unspecified: Secondary | ICD-10-CM

## 2021-05-06 NOTE — Telephone Encounter (Signed)
I faxed it on 04/03/21. I am assuming this was not received by [employer]? I can refax, just let me know for sure

## 2021-05-06 NOTE — Telephone Encounter (Signed)
Pt reports they mailed Trulicity paperwork to [place] last month. Could you check front desk for paperwork?

## 2021-05-06 NOTE — Progress Notes (Signed)
Reason for Encounter: CCM (Trulicity 0052 Approved)   Called [employer] to check on the status of patient's Trulicity application for 5910. Patient has been approved from 04/28/2021 - 03/29/2022. Medication was delivered to patient on February 7. I called patient to verify. No answer; left message.    Debbora Dus, CPP notified   Marijean Niemann, [place] Clinical Pharmacy Assistant [phone]   Time Spent: 10 Minutes

## 2021-05-06 NOTE — Patient Instructions (Signed)
Dear [name],  Below is a summary of the goals we discussed during our follow up appointment on May 06, 2021. Please contact me anytime with questions or concerns.   Visit Information   Goals Addressed   None    Patient Care Plan: CCM Pharmacy Care Plan     Problem Identified: [facility] AMB "PATIENT-SPECIFIC PROBLEM"      Long-Range Goal: Disease Management   Start Date: 06/04/2020  This Visit's Progress: On track  Recent Progress: Not on track  Priority: High  Note:   Current Barriers:  Unable to achieve control of diabetes    Pharmacist Clinical Goal(s):  Patient will adhere to plan to optimize therapeutic regimen for diabetes as evidenced by report of adherence to recommended medication management changes through collaboration with PharmD and provider.   Interventions: 1:1 collaboration with Tonia Ghent, MD regarding development and update of comprehensive plan of care as evidenced by provider attestation and co-signature Inter-disciplinary care team collaboration (see longitudinal plan of care) Comprehensive medication review performed; medication list updated in electronic medical record  Hyperlipidemia: (LDL goal < 70) -Uncontrolled - Last lipid panel 2021, TG elevated 484. Due for re-eval. Poor adherence to statin per refill history, however, pt affirms daily adherence. May consider discontinuing gemfibrozil and maximizing statin dose due to risk of side effects and TG < 500. No history of statin intolerance. TG were 943 in 2017.  -Current treatment: Atorvastatin 10 mg - 1 tablet daily  - appropriate, query effective Gemfibrozil 600 mg - 1 tablet BID -  appropriate, query safe -Medications previously tried: none -Recommended to continue current medication; Recommend daily adherence to atorvastatin and improved diabetes control. Update lipid panel with next labs.  Diabetes (A1c goal <8% - necessary for back surgery) -Uncontrolled - A1c 10.9% - Initial  appointment completed with Dr. Kelton Pillar, MD 04/11/20. Pt reports increased Toujeo to 200 units (2 divided doses) and taking Novolog 15 units with meals. Reminded him about chart provided by endocrinology for Novolog correction at meals times. His wife found chart and they will start using.  His Elenor Legato 2  is working properly and scanning 3-4 times/day. His wife is getting more comfortable with the technology. Need to check on Trulicity 6568 PAP status. -Current medications: Farxiga 10 mg - 1 tablet daily - appropriate, query effective Toujeo Max Solostar 300 unit/mL - 200 units every morning in divided doses- appropriate, query effective Trulicity 4.5 mg - Inject once weekly on Sundays - appropriate, query effective Novolog - Inject 15 units TID before meals (plus correction dose of 0-10 units) - appropriate, query effective -Medications previously tried: Januvia (replaced with Trulicity), Metformin (GI intolerance) -Current home glucose readings - Freestyle Libre 2 Fasting ranging: 05/06/21 - 239 (after lunch) 05/05/21 - 208 (10:37 AM), 218 (11:30 AM)  Lowest 04/30/21 - 164 -Denies hypoglycemic/hyperglycemic symptoms -Current meal patterns: see nutrition notes -Current exercise: limited due to back pain -Recommend continue current medications and follow up with endocrinology  Patient Goals/Self-Care Activities Patient will:  - check glucose before meals and 2 hours after meals with Elenor Legato - bring [place] reader (phone) to all appointments  Follow Up Plan:  - CPP PharmD follow up visit 1 month      Patient verbalizes understanding of instructions and care plan provided today and agrees to view in [place]. Active MyChart status confirmed with patient.    Debbora Dus, PharmD Clinical Pharmacist Practitioner [facility] [phone]

## 2021-05-06 NOTE — Progress Notes (Signed)
Chronic Care Management Pharmacy Note  04/01/2021 Name:  [name] MRN:  [id number] DOB:  1941/03/07  Summary:  CCM diabetes follow up. Pt reports first endocrinology visit went well. He increased Toujeo to 200 units and divides into 2 doses. Also taking Novolog 15 units with meals, TID. Reminded him about chart provided by endocrinology for Novolog correction dose at meals times. His wife found chart and they will start using.  His Elenor Legato 2  is working properly and scanning 3-4 times/day. His wife is getting more comfortable with the technology. Will check on status of Trulicity PAP renewal for 2023.  Recommendations:  Increase number of blood glucose scans/day Recommend updating lipid panel with next labs  Plan:  Continue to follow up with endocrinology and nutrition CCM PharmD follow up 1 month  Subjective: [name] is an 81 y.o. year old male who is a primary patient of Damita Dunnings, Elveria Rising, MD.  The CCM team was consulted for assistance with disease management and care coordination needs.    Engaged with patient by telephone for follow up visit in response to provider referral for pharmacy case management and/or care coordination services.    Consent to Services:  The patient was given information about Chronic Care Management services, agreed to services, and gave verbal consent prior to initiation of services.  Please see initial visit note for detailed documentation.   Patient Care Team: Tonia Ghent, MD as PCP - General (Family Medicine) Clent Jacks, MD as Consulting Physician (Ophthalmology) Renato Shin, MD as Consulting Physician (Endocrinology) Armbruster, Carlota Raspberry, MD as Consulting Physician (Gastroenterology) Irene Limbo, MD as Consulting Physician (Plastic Surgery) Garvin Fila, MD as Consulting Physician (Neurology) Debbora Dus, [facility] as Pharmacist (Pharmacist) Kristeen Miss, MD as Consulting Physician (Neurosurgery)  Recent office  visits 04/03/2021 - Elsie Stain, MD - Pt presented for diabetes follow up. Increase Novolog before meals to 12 units, may increase further to 15 units if BG remains > 200. Take before all meals. Follow up with endocrinology.  Recent consult visits: 04/11/2021 Kelton Pillar, MD Endocrinology - Initial consult, increase Novolog to 15 units with each meal, plus correction. Continue other medication same. Follow up 6 weeks. 04/08/2021 - Johny Drilling, RN, CCM, CDCES, Nutrition - Wife reports difficulty with current FreeStyle sensor. Message that reads incompatable. She brought box from pharmacy. They gave patient FreeStyle Libre 1. He has been using Libre 2. Wife has [place] 2 app on her phone. She will return to pharmacy. Recommend split insulin dose (don't give 175 units to one area). Avoid sugar sweetened drinks (tea)  Hospital visits: None in previous 6 months  Objective:  Lab Results  Component Value Date   CREATININE 1.37 12/13/2020   BUN 29 (H) 12/13/2020   GFR 48.97 (L) 12/13/2020   GFRNONAA 40 (L) 12/12/2019   GFRAA 47 (L) 12/12/2019   NA 135 12/13/2020   K 3.8 12/13/2020   CALCIUM 10.1 12/13/2020   CO2 24 12/13/2020   GLUCOSE 207 (H) 12/13/2020    Lab Results  Component Value Date/Time   HGBA1C 10.9 (A) 04/11/2021 09:01 AM   HGBA1C 11.9 (A) 01/28/2021 11:31 AM   HGBA1C 9.9 (H) 07/17/2019 10:28 AM   HGBA1C 9.6 (H) 07/15/2018 09:35 AM   FRUCTOSAMINE 392 (H) 07/11/2020 09:41 AM   GFR 48.97 (L) 12/13/2020 10:46 AM   GFR 47.77 (L) 10/10/2020 09:51 AM    Last diabetic Eye exam:  Lab Results  Component Value Date/Time   HMDIABEYEEXA No Retinopathy  01/01/2020 12:00 AM    Last diabetic Foot exam: 12/28/19 - PCP   Lab Results  Component Value Date   CHOL 179 07/17/2019   HDL 37.70 (L) 07/17/2019   LDLCALC 82 05/09/2014   LDLDIRECT 57.0 07/17/2019   TRIG (H) 07/17/2019    484.0 Triglyceride is over 400; calculations on Lipids are invalid.   CHOLHDL 5 07/17/2019    Hepatic  Function Latest Ref Rng & Units 07/17/2019 07/15/2018 08/18/2016  Total Protein 6.0 - 8.3 g/dL 7.0 6.9 7.7  Albumin 3.5 - 5.2 g/dL 3.9 3.8 3.9  AST 0 - 37 U/L 38(H) 19 30  ALT 0 - 53 U/L 33 27 29  Alk Phosphatase 39 - 117 U/L 37(L) 36(L) 36(L)  Total Bilirubin 0.2 - 1.2 mg/dL 0.6 0.5 0.7    Lab Results  Component Value Date/Time   TSH 4.48 07/11/2020 09:41 AM    CBC Latest Ref Rng & Units 07/11/2020 12/12/2019 07/15/2018  WBC 4.0 - 10.5 K/uL 7.3 8.7 7.4  Hemoglobin 13.0 - 17.0 g/dL 15.4 14.6 14.4  Hematocrit 39.0 - 52.0 % 45.0 43.9 41.6  Platelets 150.0 - 400.0 K/uL 316.0 348 262.0    No results found for: VD25OH  Clinical ASCVD: No  The ASCVD Risk score (Arnett DK, et al., 2019) failed to calculate for the following reasons:   The 2019 ASCVD risk score is only valid for ages 69 to 6    Depression screen PHQ 2/9 03/26/2021 02/25/2021 02/19/2020  Decreased Interest 0 0 0  Down, Depressed, Hopeless 0 0 0  PHQ - 2 Score 0 0 0  Altered sleeping - - -  Tired, decreased energy - - -  Change in appetite - - -  Feeling bad or failure about yourself  - - -  Trouble concentrating - - -  Moving slowly or fidgety/restless - - -  Suicidal thoughts - - -  PHQ-9 Score - - -  Difficult doing work/chores - - -  Some recent data might be hidden    Social History   Tobacco Use  Smoking Status Never  Smokeless Tobacco Never   BP Readings from Last 3 Encounters:  04/11/21 140/82  04/08/21 120/76  04/03/21 136/78   Pulse Readings from Last 3 Encounters:  04/11/21 94  04/03/21 79  01/28/21 (!) 101   Wt Readings from Last 3 Encounters:  04/11/21 247 lb (112 kg)  04/08/21 248 lb 1.6 oz (112.5 kg)  04/03/21 249 lb (112.9 kg)   BMI Readings from Last 3 Encounters:  04/11/21 33.50 kg/m  04/08/21 33.65 kg/m  04/03/21 33.77 kg/m    Assessment/Interventions: Review of patient past medical history, allergies, medications, health status, including review of consultants reports,  laboratory and other test data, was performed as part of comprehensive evaluation and provision of chronic care management services.   SDOH:  (Social Determinants of Health) assessments and interventions performed: March 2022    SDOH Interventions   Flowsheet Row Most Recent Value  SDOH Interventions    Financial Strain Interventions Intervention Not Indicated  [Trulicity through patient assistance. Other medications affordable.]        SDOH Screenings   Alcohol Screen: Low Risk    Last Alcohol Screening Score (AUDIT): 1  Depression (PHQ2-9): Low Risk    PHQ-2 Score: 0  Financial Resource Strain: Low Risk    Difficulty of Paying Living Expenses: Not hard at all  Food Insecurity: No Food Insecurity   Worried About Charity fundraiser in the  Last Year: Never true   Ran Out of Food in the Last Year: Never true  Housing: Low Risk    Last Housing Risk Score: 0  Physical Activity: Inactive   Days of Exercise per Week: 0 days   Minutes of Exercise per Session: 0 min  Social Connections: Moderately Isolated   Frequency of Communication with Friends and Family: Twice a week   Frequency of Social Gatherings with Friends and Family: Twice a week   Attends Religious Services: Never   Marine scientist or Organizations: No   Attends Music therapist: Never   Marital Status: Married  Stress: No Stress Concern Present   Feeling of Stress : Not at all  Tobacco Use: Low Risk    Smoking Tobacco Use: Never   Smokeless Tobacco Use: Never   Passive Exposure: Not on file  Transportation Needs: No Transportation Needs   Lack of Transportation (Medical): No   Lack of Transportation (Non-Medical): No      CCM Care Plan  Allergies  Allergen Reactions   Morphine And Related Nausea And Vomiting    Causes nausea & vomiting   Codeine Sulfate Other (See Comments)    intolerant   Metformin And Related Other (See Comments)    Gi intolerance, not an allergy    Medications  Reviewed Today     Reviewed by Debbora Dus, [facility] (Pharmacist) on 05/06/21 at 1523  Med List Status: <None>   Medication Order Taking? Sig Documenting Provider Last Dose Status Informant  allopurinol (ZYLOPRIM) 300 MG tablet [id number]  TAKE 1/2 TABLET BY MOUTH EVERY MORNING Tonia Ghent, MD  Active   amLODipine (NORVASC) 10 MG tablet [id number]  TAKE 1 TABLET BY MOUTH DAILY Tonia Ghent, MD  Active   Ascorbic Acid (VITAMIN C WITH ROSE HIPS) 500 MG tablet [id number] No Take 500 mg by mouth daily. [provider] Taking Active   aspirin 81 MG tablet [id number] No Take 81 mg by mouth daily. [provider] Taking Active Self  atorvastatin (LIPITOR) 10 MG tablet [id number] No TAKE 1 TABLET BY MOUTH EACH EVENING Tonia Ghent, MD Taking Active   blood glucose meter kit and supplies [id number] No Dispense based on patient and insurance preference. Use up to four times daily as directed. (FOR ICD-10 E10.9, E11.9). Tonia Ghent, MD Taking Active   Continuous Blood Gluc Receiver (FREESTYLE LIBRE 2 READER) [place] [id number] No USE TO CHECK BLOOD SUGAR DAILY Tonia Ghent, MD Taking Active   Continuous Blood Gluc Sensor (FREESTYLE LIBRE 2 SENSOR) [place] [id number] No USE AS DIRECTED BY PROVIDER Tonia Ghent, MD Taking Active   dapagliflozin propanediol (FARXIGA) 10 MG TABS tablet [id number]  Take 1 tablet (10 mg total) by mouth daily before breakfast. Shamleffer, Melanie Crazier, MD  Active   diclofenac sodium (VOLTAREN) 1 % GEL [id number] No Uses PRN [provider] Taking Active   dorzolamide-timolol (COSOPT) 22.3-6.8 MG/ML ophthalmic solution [id number] No Place 1 drop into the left eye 2 (two) times daily.  [provider] Taking Active   Dulaglutide (TRULICITY) 4.5 LT/9.0ZE SOPN [id number]  Inject 4.5 mg as directed once a week. Shamleffer, Melanie Crazier, MD  Active   gemfibrozil (LOPID) 600 MG tablet [id number] No Take 1 tablet (600 mg total) by mouth 2 (two)  times daily. Tonia Ghent, MD Taking Active   glucose blood test strip [id number] No Use as instructed to check blood glucose twice daily Tonia Ghent, MD Taking Active  insulin aspart (NOVOLOG) 100 UNIT/ML FlexPen [id number]  Max daily 50 units Shamleffer, Melanie Crazier, MD  Active   insulin glargine, 2 Unit Dial, (TOUJEO MAX SOLOSTAR) 300 UNIT/ML Solostar Pen [id number]  Inject 200 Units into the skin daily in the afternoon. 175 units injected in the AM Shamleffer, Melanie Crazier, MD  Active   Insulin Pen Needle 31G X 5 MM MISC [id number]  1 Device by Does not apply route in the morning, at noon, in the evening, and at bedtime. Shamleffer, Melanie Crazier, MD  Active   latanoprost (XALATAN) 0.005 % ophthalmic solution [id number] No Place 1 drop into both eyes at bedtime. [provider] Taking Active Self  losartan (COZAAR) 100 MG tablet [id number] No TAKE 1 TABLET BY MOUTH DAILY Tonia Ghent, MD Taking Active   meloxicam (MOBIC) 15 MG tablet [id number] No TAKE 1/2 TO 1 TABLET BY MOUTH DAILY AS NEEDED FOR PAIN...TAKE WITH FOOD Tonia Ghent, MD Taking Active   methylcellulose (CITRUCEL) oral powder [id number] No Take 1 packet by mouth daily. Yetta Flock, MD Taking Active   metoprolol succinate (TOPROL-XL) 25 MG 24 hr tablet [id number] No TAKE 1 TABLET BY MOUTH DAILY Tonia Ghent, MD Taking Active   Multiple Vitamins-Minerals (VITRUM 50+ SENIOR MULTI PO) [id number] No Take 1 tablet by mouth daily.  [provider] Taking Active Self  mupirocin ointment (BACTROBAN) 2 % [id number] No Apply 1 application topically 2 (two) times daily. Tonia Ghent, MD Taking Active   Tadalafil 2.5 MG TABS [id number] No TAKE 1 TABLET BY MOUTH DAILY Tonia Ghent, MD Taking Active             Patient Active Problem List   Diagnosis Date Noted   Dyslipidemia 04/11/2021   Dysuria 04/07/2021   Back pain 10/22/2019   Fall at home 09/27/2019   Advance care planning  07/25/2019   Pressure ulcer, buttock 04/17/2019   Skin irritation 03/29/2019   Gout 07/22/2018   HTN (hypertension) 06/11/2017   Hyperlipemia    Lower urinary tract symptoms (LUTS) 03/04/2017   Bell's palsy 10/11/2016   Paresthesia 05/28/2016   Medicare annual wellness visit, subsequent 04/22/2016   Cough 04/22/2016   Allergic rhinitis 03/12/2016   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   Erectile dysfunction 06/11/2015   Rhinorrhea 03/13/2015   S/P revision of total hip 01/10/2015   Dislocation, hip ([place]) 11/09/2014   DJD (degenerative joint disease) 11/06/2014   Primary osteoarthritis of right knee    Primary localized osteoarthritis of right hip    Spinal stenosis of lumbar region with radiculopathy    Diabetes mellitus type 2 in obese (HCC)    PONV (postoperative nausea and vomiting)    GERD (gastroesophageal reflux disease)    Frequent urination at night    Kidney stones    Arthritis    Neuromuscular disorder ([place])    Benign prostatic hyperplasia with urinary obstruction 07/14/2012   Chronic calculus cholecystitis 12/31/2011   Gouty arthropathy 03/01/2009    Immunization History  Administered Date(s) Administered   Fluad Quad(high Dose 65+) 12/28/2019, 01/28/2021   Influenza Split 01/30/2009, 02/07/2010   Influenza, High Dose Seasonal PF 12/17/2011, 01/14/2017   Influenza,inj,Quad PF,6+ Mos 02/17/2016, 01/19/2018   Influenza,inj,quad, With Preservative 12/01/2018   Influenza,trivalent, recombinat, inj, PF 01/02/2011   Influenza-Unspecified 03/11/2015, 12/01/2018   PFIZER(Purple Top)SARS-COV-2 Vaccination 04/22/2019, 05/13/2019, 12/13/2019   PPD Test 08/04/2008   Pfizer Covid-19 Vaccine Bivalent Booster 44yr & up 10/31/2020   Pneumococcal Conjugate-13 02/07/2010, 03/26/2014   Pneumococcal  Polysaccharide-23 02/07/2010   Td 04/04/2012   Tdap 03/26/2006   Zoster, Live 03/30/2009    Conditions to be addressed/monitored:  Hypertension, Hyperlipidemia, and Diabetes  Care  Plan : [place]  Updates made by Debbora Dus, [place] since 05/06/2021 12:00 AM     Problem: CHL AMB "PATIENT-SPECIFIC PROBLEM"      Long-Range Goal: Disease Management   Start Date: 06/04/2020  This Visit's Progress: On track  Recent Progress: Not on track  Priority: High  Note:   Current Barriers:  Unable to achieve control of diabetes    Pharmacist Clinical Goal(s):  Patient will adhere to plan to optimize therapeutic regimen for diabetes as evidenced by report of adherence to recommended medication management changes through collaboration with PharmD and provider.   Interventions: 1:1 collaboration with Tonia Ghent, MD regarding development and update of comprehensive plan of care as evidenced by provider attestation and co-signature Inter-disciplinary care team collaboration (see longitudinal plan of care) Comprehensive medication review performed; medication list updated in electronic medical record  Hyperlipidemia: (LDL goal < 70) -Uncontrolled - Last lipid panel 2021, TG elevated 484. Due for re-eval. Poor adherence to statin per refill history, however, pt affirms daily adherence. May consider discontinuing gemfibrozil and maximizing statin dose due to risk of side effects and TG < 500. No history of statin intolerance. TG were 943 in 2017.  -Current treatment: Atorvastatin 10 mg - 1 tablet daily  - appropriate, query effective Gemfibrozil 600 mg - 1 tablet BID -  appropriate, query safe -Medications previously tried: none -Recommended to continue current medication; Recommend daily adherence to atorvastatin and improved diabetes control. Update lipid panel with next labs.  Diabetes (A1c goal <8% - necessary for back surgery) -Uncontrolled - A1c 10.9% - Initial appointment completed with Dr. Kelton Pillar, MD 04/11/20. Pt reports increased Toujeo to 200 units (2 divided doses) and taking Novolog 15 units with meals. Reminded him about chart provided by  endocrinology for Novolog correction at meals times. His wife found chart and they will start using.  His Elenor Legato 2  is working properly and scanning 3-4 times/day. His wife is getting more comfortable with the technology. Need to check on Trulicity 1601 PAP status. -Current medications: Farxiga 10 mg - 1 tablet daily - appropriate, query effective Toujeo Max Solostar 300 unit/mL - 200 units every morning in divided doses- appropriate, query effective Trulicity 4.5 mg - Inject once weekly on Sundays - appropriate, query effective Novolog - Inject 15 units TID before meals (plus correction dose of 0-10 units) - appropriate, query effective -Medications previously tried: Januvia (replaced with Trulicity), Metformin (GI intolerance) -Current home glucose readings - Freestyle Libre 2 Fasting ranging: 05/06/21 - 239 (after lunch) 05/05/21 - 208 (10:37 AM), 218 (11:30 AM)  Lowest 04/30/21 - 164 -Denies hypoglycemic/hyperglycemic symptoms -Current meal patterns: see nutrition notes -Current exercise: limited due to back pain -Recommend continue current medications and follow up with endocrinology  Patient Goals/Self-Care Activities Patient will:  - check glucose before meals and 2 hours after meals with Elenor Legato - bring Marble reader (phone) to all appointments  Follow Up Plan:  - CPP PharmD follow up visit 1 month    Medication Assistance: Trulicity 4.5 mg obtained through [employer] medication assistance program.  Enrollment ends 03/29/21. Re-enrollment forms faxed 04/03/21. Awaiting notification on decision from [place].  Compliance/Adherence/Medication fill history: Care Gaps: Diabetic eye exam due  Star Rating Drugs: Medication:  Last Fill:         Day Supply Farxiga 45m              101/00/71         30 Trulicity 42.1FX            Gets from [employer]100unit/ml     03/05/21            90 Toujeo 300unit/ml       01/20/21          30 Losartan 1092m         12/14/20             90 Atorvastatin 1054m     10/21/20            90 PAST DUE  Patient's preferred pharmacy is:  [facility] - [id number]: [phone]: [phone] pill box? Yes Pt endorses 100% compliance  Care Plan and Follow Up Patient Decision:  Patient agrees to Care Plan and Follow-up.  MicDebbora DusharmD Clinical Pharmacist [name]

## 2021-05-08 ENCOUNTER — Telehealth: Payer: Self-pay

## 2021-05-08 NOTE — Progress Notes (Signed)
° ° °  Chronic Care Management Pharmacy Assistant   Name: [name]  MRN: [id number] DOB: [birth year]  Reason for Encounter: CCM (Trulicity 3817 Approved)   [place] to check on the status of patient's Trulicity application for 7116. Patient has been approved from 04/28/2021 - 03/29/2022. Medication was delivered to patient on February 7. I called patient to verify. No answer; left message.   Debbora Dus, CPP notified  Marijean Niemann, [place] Clinical Pharmacy Assistant [phone]  Time Spent: 10 Minutes

## 2021-05-23 ENCOUNTER — Other Ambulatory Visit: Payer: Self-pay

## 2021-05-23 ENCOUNTER — Telehealth: Payer: Self-pay

## 2021-05-23 ENCOUNTER — Encounter: Payer: Self-pay | Admitting: Internal Medicine

## 2021-05-23 ENCOUNTER — Ambulatory Visit (INDEPENDENT_AMBULATORY_CARE_PROVIDER_SITE_OTHER): Payer: HMO | Admitting: Internal Medicine

## 2021-05-23 VITALS — BP 134/80 | HR 91 | Ht 72.0 in | Wt 252.0 lb

## 2021-05-23 DIAGNOSIS — E1165 Type 2 diabetes mellitus with hyperglycemia: Secondary | ICD-10-CM

## 2021-05-23 DIAGNOSIS — N1831 Chronic kidney disease, stage 3a: Secondary | ICD-10-CM

## 2021-05-23 DIAGNOSIS — Z794 Long term (current) use of insulin: Secondary | ICD-10-CM | POA: Diagnosis not present

## 2021-05-23 DIAGNOSIS — E1122 Type 2 diabetes mellitus with diabetic chronic kidney disease: Secondary | ICD-10-CM

## 2021-05-23 LAB — POCT GLYCOSYLATED HEMOGLOBIN (HGB A1C): Hemoglobin A1C: 9.3 % — AB (ref 4.0–5.6)

## 2021-05-23 MED ORDER — TOUJEO MAX SOLOSTAR 300 UNIT/ML ~~LOC~~ SOPN: 220.0000 [IU] | PEN_INJECTOR | Freq: Every day | SUBCUTANEOUS | 3 refills | Status: DC

## 2021-05-23 NOTE — Telephone Encounter (Signed)
Lvm for pt to call back to schedule for diabetic shoe pick up

## 2021-05-23 NOTE — Patient Instructions (Addendum)
-    Increase Toujeo to 220 units  daily  - Continue Trulicity 4.5 mg weekly  - Continue Farxiga 10 mg, 1 tablet daily  - Continue Novolog 15 units WITH each meal  -Novolog correctional insulin: ADD extra units on insulin to your meal-time Novolog  dose if your blood sugars are higher than 155. Use the scale below to help guide you:   Blood sugar before meal Number of units to inject  Less than 155 0 unit  156 -  180 1 units  181 -  205 2 units  206 -  230 3 units  231 -  255 4 units  256 -  280 5 units  281 -  305 6 units  306 -  330 7 units  331 -  355 8 units  356 - 380 9 units   381 - 405 10 units     HOW TO TREAT LOW BLOOD SUGARS (Blood sugar LESS THAN 70 MG/DL) Please follow the RULE OF 15 for the treatment of hypoglycemia treatment (when your (blood sugars are less than 70 mg/dL)   STEP 1: Take 15 grams of carbohydrates when your blood sugar is low, which includes:  3-4 GLUCOSE TABS  OR 3-4 OZ OF JUICE OR REGULAR SODA OR ONE TUBE OF GLUCOSE GEL    STEP 2: RECHECK blood sugar in 15 MINUTES STEP 3: If your blood sugar is still low at the 15 minute recheck --> then, go back to STEP 1 and treat AGAIN with another 15 grams of carbohydrates.

## 2021-05-23 NOTE — Progress Notes (Signed)
Name: [name]  MRN/ DOB: [id number], [birth year]   Age/ Sex: 81 y.o., male    PCP: Tonia Ghent, MD   Reason for Endocrinology Evaluation: Type 2 Diabetes Mellitus     Date of Initial Endocrinology Visit: 04/11/2021    PATIENT IDENTIFIER: [name] is a 81 y.o. male with a past medical history of T2DM, HTN and Dyslipidemia . The patient presented for initial endocrinology clinic visit on 04/11/2021 for consultative assistance with his diabetes management.    HPI: [name] was    Diagnosed with DM yrs ago Prior Medications tried/Intolerance: INtolerant to Metformin              Hemoglobin A1c has ranged from 7.7% in 2019, peaking at 11.9% in 2022.     On his initial visit to our clinic he was on Trulicity, farxiga, toujeo and Novolog with an A1c 10.9% . We adjusted MDI regimen and continued trulicity, farxiga , we also provided him with a correction scale    SUBJECTIVE:   During the last visit (04/11/2021): A1c  Today (05/23/21): [name] is here for a follow up on diabetes management. He checks his blood sugars multiple times daily. The patient has not had hypoglycemic episodes since the last clinic visit.  Has noted left arm tingling around the wrist started 2 weeks ago .   Back sx delayed due to hyperglycemia   HOME DIABETES REGIMEN: Trulicity 4.5 mg weekly  Farxiga 10 mg daily  Toujeo 200 mg daily  Novolog 15 units with each meal  Correction Factor: Novolog (BG-130/25)    Statin: yes ACE-I/ARB: yes Prior Diabetic Education: yes   CONTINUOUS GLUCOSE MONITORING RECORD INTERPRETATION    Dates of Recording: 2/11-2/24/2023  Sensor description: freestyle libre   Results statistics:   CGM use % of time 61  Average and SD 219/20.8  Time in range     18   %  % Time Above 180 57  % Time above 250 25  % Time Below target 0      Glycemic patterns summary: Hyperglycemia during the night, but worse during the day with  meals  Hyperglycemic episodes  postprandial  Hypoglycemic episodes occurred n/a  Overnight periods: trends down       DIABETIC COMPLICATIONS: Microvascular complications:  CKD III Denies: neuropathy  Last eye exam: Completed 2022 - Dr. Katy Fitch   Macrovascular complications:   Denies: CAD, PVD, CVA   PAST HISTORY: Past Medical History:  Past Medical History:  Diagnosis Date   Arthritis    arthirtis all over   Bell's palsy    Cataract 2020   bilateral eyes -pending surgery June 2020   Concussion    multiple- college football player   Diabetes mellitus type 2 in obese ([place])    Frequent urination at night    GERD (gastroesophageal reflux disease)    occ indigestion   Glaucoma    both eyes   Hyperlipemia    Hypertension    Kidney stones    PONV (postoperative nausea and vomiting)    Primary localized osteoarthritis of right hip    Primary osteoarthritis of right knee    Restless leg syndrome    Right knee DJD    Spinal stenosis of lumbar region with radiculopathy    s/p L4/L5 transforaminal blocks   Past Surgical History:  Past Surgical History:  Procedure Laterality Date   ABDOMINAL HERNIA REPAIR     ANTERIOR HIP REVISION Right 01/08/2015  Procedure: RIGHT ANTERIOR HIP REVISION;  Surgeon: Renette Butters, MD;  Location: [facility];  Service: Orthopedics;  Laterality: Right;   BACK SURGERY     CANTHOPLASTY Right 02/09/2017   Procedure: RIGHT LATERAL CANTHOPLASTY;  Surgeon: Irene Limbo, MD;  Location: [facility];  Service: Plastics;  Laterality: Right;   CANTHOPLASTY Left 06/29/2017   Procedure: LEFT LOWER CANTHOPLASTY;  Surgeon: Irene Limbo, MD;  Location: [facility];  Service: Plastics;  Laterality: Left;   CATARACT EXTRACTION Left    COLONOSCOPY     ELBOW ARTHROSCOPY  1990   right   ELBOW SURGERY     right   HERNIA REPAIR  1980   inguinal   HIP CLOSED REDUCTION Right 11/09/2014   Procedure: CLOSED REDUCTION HIP, s/p  total hip 8/9;  Surgeon: Ninetta Lights, MD;  Location: [place];  Service: Orthopedics;  Laterality: Right;   JOINT REPLACEMENT  2008   knee   LITHOTRIPSY     LUMBAR DISC SURGERY     2015   RECONSTRUCTION OF EYELID     REVISION TOTAL HIP ARTHROPLASTY Right 01/08/2015   SHOULDER ARTHROSCOPY  1980   right   SHOULDER SURGERY     right   STONE EXTRACTION WITH BASKET  2010   TOTAL HIP ARTHROPLASTY Right 11/06/2014   Procedure: RIGHT TOTAL HIP ARTHROPLASTY ANTERIOR APPROACH;  Surgeon: Renette Butters, MD;  Location: [facility];  Service: Orthopedics;  Laterality: Right;   TOTAL KNEE ARTHROPLASTY Left 07/2006   left knee   TOTAL KNEE ARTHROPLASTY Right 01/08/2014   Procedure: RIGHT TOTAL KNEE ARTHROPLASTY;  Surgeon: Lorn Junes, MD;  Location: [facility];  Service: Orthopedics;  Laterality: Right;    Social History:  reports that he has never smoked. He has never used smokeless tobacco. He reports current alcohol use. He reports that he does not use drugs. Family History:  Family History  Problem Relation Age of Onset   Hypertension Mother    Kidney disease Mother    Hypertension Father    Diabetes Mellitus II Father    Colon polyps Father    Diabetes Mellitus II Brother    Diabetes Mellitus II Brother    Prostate cancer Brother    Colon cancer Neg Hx      HOME MEDICATIONS: Allergies as of 05/23/2021       Reactions   Morphine And Related Nausea And Vomiting   Causes nausea & vomiting   Codeine Sulfate Other (See Comments)   intolerant   Metformin And Related Other (See Comments)   Gi intolerance, not an allergy        Medication List        Accurate as of May 23, 2021  9:34 AM. If you have any questions, ask your nurse or doctor.          allopurinol 300 MG tablet Commonly known as: ZYLOPRIM TAKE 1/2 TABLET BY MOUTH EVERY MORNING   amLODipine 10 MG tablet Commonly known as: NORVASC TAKE 1 TABLET BY MOUTH DAILY   aspirin 81 MG tablet Take 81 mg by mouth  daily.   atorvastatin 10 MG tablet Commonly known as: LIPITOR TAKE 1 TABLET BY MOUTH EACH EVENING   blood glucose meter kit and supplies Dispense based on patient and insurance preference. Use up to four times daily as directed. (FOR ICD-10 E10.9, E11.9).   Citrucel oral powder Generic drug: methylcellulose Take 1 packet by mouth daily.   dapagliflozin propanediol 10 MG Tabs  tablet Commonly known as: Farxiga Take 1 tablet (10 mg total) by mouth daily before breakfast.   diclofenac sodium 1 % Gel Commonly known as: VOLTAREN Uses PRN   dorzolamide-timolol 22.3-6.8 MG/ML ophthalmic solution Commonly known as: COSOPT Place 1 drop into the left eye 2 (two) times daily.   FreeStyle Libre 2 Reader Kerrin Mo USE TO CHECK BLOOD SUGAR DAILY   FreeStyle Libre 2 Sensor Misc USE AS DIRECTED BY PROVIDER   gemfibrozil 600 MG tablet Commonly known as: LOPID Take 1 tablet (600 mg total) by mouth 2 (two) times daily.   glucose blood test strip Use as instructed to check blood glucose twice daily   insulin aspart 100 UNIT/ML FlexPen Commonly known as: NOVOLOG Max daily 50 units   Insulin Pen Needle 31G X 5 MM Misc 1 Device by Does not apply route in the morning, at noon, in the evening, and at bedtime.   latanoprost 0.005 % ophthalmic solution Commonly known as: XALATAN Place 1 drop into both eyes at bedtime.   losartan 100 MG tablet Commonly known as: COZAAR TAKE 1 TABLET BY MOUTH DAILY   meloxicam 15 MG tablet Commonly known as: MOBIC TAKE 1/2 TO 1 TABLET BY MOUTH DAILY AS NEEDED FOR PAIN...TAKE WITH FOOD   metoprolol succinate 25 MG 24 hr tablet Commonly known as: TOPROL-XL TAKE 1 TABLET BY MOUTH DAILY   mupirocin ointment 2 % Commonly known as: BACTROBAN Apply 1 application topically 2 (two) times daily.   Tadalafil 2.5 MG Tabs TAKE 1 TABLET BY MOUTH DAILY   Toujeo Max SoloStar 300 UNIT/ML Solostar Pen Generic drug: insulin glargine (2 Unit Dial) Inject 200 Units  into the skin daily in the afternoon. 175 units injected in the AM   Trulicity 4.5 GO/1.1XB Sopn Generic drug: Dulaglutide Inject 4.5 mg as directed once a week.   vitamin C with rose hips 500 MG tablet Take 500 mg by mouth daily.   VITRUM 50+ SENIOR MULTI PO Take 1 tablet by mouth daily.         ALLERGIES: Allergies  Allergen Reactions   Morphine And Related Nausea And Vomiting    Causes nausea & vomiting   Codeine Sulfate Other (See Comments)    intolerant   Metformin And Related Other (See Comments)    Gi intolerance, not an allergy    OBJECTIVE:   VITAL SIGNS: BP 134/80 (BP Location: Left Arm, Patient Position: Sitting, Cuff Size: Large)    Pulse 91    Ht 6' (1.829 m)    Wt 252 lb (114.3 kg)    SpO2 95%    BMI 34.18 kg/m    PHYSICAL EXAM:  General: Pt appears well and is in NAD  Neck: General: Supple without adenopathy or carotid bruits. Thyroid: Thyroid size normal.  No goiter or nodules appreciated.   Lungs: Clear with good BS bilat with no rales, rhonchi, or wheezes  Heart: RRR   Extremities:  Lower extremities - trace  pretibial edema  Neuro: MS is good with appropriate affect, pt is alert and Ox3      DATA REVIEWED:  Lab Results  Component Value Date   HGBA1C 10.9 (A) 04/11/2021   HGBA1C 11.9 (A) 01/28/2021   HGBA1C 10.0 (A) 10/10/2020   Lab Results  Component Value Date   LDLCALC 82 05/09/2014   CREATININE 1.37 12/13/2020   No results found for: MICRALBCREAT  Lab Results  Component Value Date   CHOL 179 07/17/2019   HDL 37.70 (L) 07/17/2019  LDLCALC 82 05/09/2014   LDLDIRECT 57.0 07/17/2019   TRIG (H) 07/17/2019    484.0 Triglyceride is over 400; calculations on Lipids are invalid.   CHOLHDL 5 07/17/2019        ASSESSMENT / PLAN / RECOMMENDATIONS:   1) Type 2 Diabetes Mellitus, Poorly controlled, With CKD III complications - Most recent A1c of 9.3 %. Goal A1c < 8.0 %.     - A1c down from 10.9 % , praised the pt on improved  glycemic control  - Pt continues with hyperglycemia , dietary indiscretions and medication non-adherence.  - I have again emphasized the importance of taking prandial insulin with each and every meal.  - We reviewed his CGM data and how his BG trends to 300 mg/dl postprandial.  -Patient is pending back surgery and A1c needs to be optimized -I will increase his toujeo as below   MEDICATIONS: Continue Trulicity 4.5 mg weekly  Continue Farxiga 10 mg, 1 tablet daily  Increase Toujeo to 220 units  daily  Continue Novolog 15 units WITH each meal  Correction factor (BG -130/25)  EDUCATION / INSTRUCTIONS: BG monitoring instructions: Patient is instructed to check his blood sugars 3 times a day, before each meal. Call [facility] if: BG persistently < 70  I reviewed the Rule of 15 for the treatment of hypoglycemia in detail with the patient. Literature supplied.   2) Diabetic complications:  Eye: Does not have known diabetic retinopathy.  Neuro/ Feet: Does not have known diabetic peripheral neuropathy. Renal: Patient does  have known baseline CKD. He is  on an ACEI/ARB at present.   F/U in 3 months   Signed electronically by: Mack Guise, MD  [facility]  [facility] [address] [place], [place] [address] Phone: [phone] FAX: [phone]   CC: Tonia Ghent, MD [place] [address] Phone: [phone]  Fax: [phone]    Return to Endocrinology clinic as below: Future Appointments  Date Time Provider [place]  06/12/2021  8:45 AM [facility] CCM PHARMACIST [facility]

## 2021-05-27 DIAGNOSIS — E785 Hyperlipidemia, unspecified: Secondary | ICD-10-CM

## 2021-05-27 DIAGNOSIS — E669 Obesity, unspecified: Secondary | ICD-10-CM

## 2021-05-27 DIAGNOSIS — E1169 Type 2 diabetes mellitus with other specified complication: Secondary | ICD-10-CM

## 2021-06-09 ENCOUNTER — Telehealth: Payer: Self-pay

## 2021-06-09 NOTE — Chronic Care Management (AMB) (Signed)
? ? ?  Chronic Care Management ?Pharmacy Assistant  ? ?Name: [name]  MRN: [id number] DOB: 1940/07/19 ? ?Reason for Encounter: Reminder Call ?  ?Conditions to be addressed/monitored: ?HTN, HLD, and DMII ? ? ?Medications: ?Outpatient Encounter Medications as of 06/09/2021  ?Medication Sig  ? allopurinol (ZYLOPRIM) 300 MG tablet TAKE 1/2 TABLET BY MOUTH EVERY MORNING  ? amLODipine (NORVASC) 10 MG tablet TAKE 1 TABLET BY MOUTH DAILY  ? Ascorbic Acid (VITAMIN C WITH ROSE HIPS) 500 MG tablet Take 500 mg by mouth daily.  ? aspirin 81 MG tablet Take 81 mg by mouth daily.  ? atorvastatin (LIPITOR) 10 MG tablet TAKE 1 TABLET BY MOUTH EACH EVENING  ? blood glucose meter kit and supplies Dispense based on patient and insurance preference. Use up to four times daily as directed. (FOR ICD-10 E10.9, E11.9).  ? Continuous Blood Gluc Receiver (FREESTYLE LIBRE 2 READER) DEVI USE TO CHECK BLOOD SUGAR DAILY  ? Continuous Blood Gluc Sensor (FREESTYLE LIBRE 2 SENSOR) MISC USE AS DIRECTED BY PROVIDER  ? dapagliflozin propanediol (FARXIGA) 10 MG TABS tablet Take 1 tablet (10 mg total) by mouth daily before breakfast.  ? diclofenac sodium (VOLTAREN) 1 % GEL Uses PRN  ? dorzolamide-timolol (COSOPT) 22.3-6.8 MG/ML ophthalmic solution Place 1 drop into the left eye 2 (two) times daily.   ? Dulaglutide (TRULICITY) 4.5 NU/2.7OZ SOPN Inject 4.5 mg as directed once a week.  ? gemfibrozil (LOPID) 600 MG tablet Take 1 tablet (600 mg total) by mouth 2 (two) times daily.  ? glucose blood test strip Use as instructed to check blood glucose twice daily  ? insulin aspart (NOVOLOG) 100 UNIT/ML FlexPen Max daily 50 units  ? insulin glargine, 2 Unit Dial, (TOUJEO MAX SOLOSTAR) 300 UNIT/ML Solostar Pen Inject 220 Units into the skin daily in the afternoon.  ? Insulin Pen Needle 31G X 5 MM MISC 1 Device by Does not apply route in the morning, at noon, in the evening, and at bedtime.  ? latanoprost (XALATAN) 0.005 % ophthalmic solution Place 1 drop into  both eyes at bedtime.  ? losartan (COZAAR) 100 MG tablet TAKE 1 TABLET BY MOUTH DAILY  ? meloxicam (MOBIC) 15 MG tablet TAKE 1/2 TO 1 TABLET BY MOUTH DAILY AS NEEDED FOR PAIN...TAKE WITH FOOD  ? methylcellulose (CITRUCEL) oral powder Take 1 packet by mouth daily.  ? metoprolol succinate (TOPROL-XL) 25 MG 24 hr tablet TAKE 1 TABLET BY MOUTH DAILY  ? Multiple Vitamins-Minerals (VITRUM 50+ SENIOR MULTI PO) Take 1 tablet by mouth daily.   ? mupirocin ointment (BACTROBAN) 2 % Apply 1 application topically 2 (two) times daily.  ? Tadalafil 2.5 MG TABS TAKE 1 TABLET BY MOUTH DAILY  ? ?No facility-administered encounter medications on file as of 06/09/2021.  ? ?[name] did not answer the telephone  to remind of upcoming telephone visit with Charlene Brooke on 06/12/21 at 8:45am. Patient was reminded to have any blood glucose and blood pressure readings available for review at appointment. If unable to reach, a voicemail was left for patient.  ? ? ? ?Star Rating Drugs: ?Medication:  Last Fill: Day Supply ?Farxiga 10mg   02/24/21  30 ?Trulicity 4.5mg   manufacturer ?Losartan 100mg  12/14/20  30 ?Toujeo 200mg  ?Novolog  ?Charlene Brooke, CPP notified ? ?Alastair Hennes, CCMA ?Health concierge  ?[phone]  ?

## 2021-06-12 ENCOUNTER — Telehealth: Payer: Self-pay | Admitting: Pharmacist

## 2021-06-12 ENCOUNTER — Telehealth: Payer: HMO

## 2021-06-12 NOTE — Telephone Encounter (Signed)
?  Chronic Care Management  ? ?Outreach Note ? ?06/12/2021 ?Name: [name] MRN: [id number] DOB: 11/29/1940 ? ?Referred by: Tonia Ghent, MD ? ?Patient had a phone appointment scheduled with clinical pharmacist today. ? ?An unsuccessful telephone outreach was attempted today. The patient was referred to the pharmacist for assistance with medications, care management and care coordination.  ? ?Patient will NOT be penalized in any way for missing a CCM appointment. The no-show fee does not apply. ? ?If possible, a message was left to return call to: [phone] or to [facility]. ? ?Charlene Brooke, PharmD, BCACP ?Clinical Pharmacist ?[facility] ?[phone] ? ? ?

## 2021-06-12 NOTE — Progress Notes (Deleted)
? ?Chronic Care Management ?Pharmacy Note ? ?04/01/2021 ?Name:  [name] MRN:  [id number] DOB:  1941/01/30 ? ?Summary:  CCM F/U visit ?CCM diabetes follow up. Pt reports first endocrinology visit went well. He increased Toujeo to 200 units and divides into 2 doses. Also taking Novolog 15 units with meals, TID. Reminded him about chart provided by endocrinology for Novolog correction dose at meals times. His wife found chart and they will start using.  His Elenor Legato 2  is working properly and scanning 3-4 times/day. His wife is getting more comfortable with the technology. Will check on status of Trulicity PAP renewal for 2023. ? ?Recommendations:  ?Increase number of blood glucose scans/day ?Recommend updating lipid panel with next labs ? ?Plan:  ?-[place] will call patient *** ?-Pharmacist follow up televisit scheduled for *** ? ? ? ?Subjective: ?[name] is an 81 y.o. year old male who is a primary patient of Damita Dunnings, Elveria Rising, MD.  The CCM team was consulted for assistance with disease management and care coordination needs.   ? ?Engaged with patient by telephone for follow up visit in response to provider referral for pharmacy case management and/or care coordination services.   ? ?Consent to Services:  ?The patient was given information about Chronic Care Management services, agreed to services, and gave verbal consent prior to initiation of services.  Please see initial visit note for detailed documentation.  ? ?Patient Care Team: ?Tonia Ghent, MD as PCP - General (Family Medicine) ?Clent Jacks, MD as Consulting Physician (Ophthalmology) ?Renato Shin, MD as Consulting Physician (Endocrinology) ?Armbruster, Carlota Raspberry, MD as Consulting Physician (Gastroenterology) ?Irene Limbo, MD as Consulting Physician (Plastic Surgery) ?Garvin Fila, MD as Consulting Physician (Neurology) ?Debbora Dus, [facility] as Pharmacist (Pharmacist) ?Kristeen Miss, MD as Consulting Physician  (Neurosurgery) ? ?Recent office visits ?04/03/2021 - Elsie Stain, MD - Pt presented for diabetes follow up. Increase Novolog before meals to 12 units, may increase further to 15 units if BG remains > 200. Take before all meals. Follow up with endocrinology. ? ?Recent consult visits: ?05/23/21 Dr Kelton Pillar (Endocrine): f/u DM. A1c 9.3. Increase Toujeo to 220 units/day. Emphasized compliance with Novolog + correction chart. F/U 3 months.  ?04/11/2021 Kelton Pillar, MD Endocrinology - Initial consult, increase Novolog to 15 units with each meal, plus correction. Continue other medication same. Follow up 6 weeks. ?04/08/2021 Johny Drilling, RN, CCM, CDCES, Nutrition - Wife reports difficulty with current FreeStyle sensor. Message that reads incompatable. She brought box from pharmacy. They gave patient FreeStyle Libre 1. He has been using Libre 2. Wife has [place] 2 app on her phone. She will return to pharmacy. Recommend split insulin dose (don't give 175 units to one area). Avoid sugar sweetened drinks (tea) ? ?Hospital visits: ?None in previous 6 months ? ?Objective: ? ?Lab Results  ?Component Value Date  ? CREATININE 1.37 12/13/2020  ? BUN 29 (H) 12/13/2020  ? GFR 48.97 (L) 12/13/2020  ? GFRNONAA 40 (L) 12/12/2019  ? GFRAA 47 (L) 12/12/2019  ? NA 135 12/13/2020  ? K 3.8 12/13/2020  ? CALCIUM 10.1 12/13/2020  ? CO2 24 12/13/2020  ? GLUCOSE 207 (H) 12/13/2020  ? ? ?Lab Results  ?Component Value Date/Time  ? HGBA1C 9.3 (A) 05/23/2021 09:54 AM  ? HGBA1C 10.9 (A) 04/11/2021 09:01 AM  ? HGBA1C 9.9 (H) 07/17/2019 10:28 AM  ? HGBA1C 9.6 (H) 07/15/2018 09:35 AM  ? FRUCTOSAMINE 392 (H) 07/11/2020 09:41 AM  ? GFR 48.97 (L) 12/13/2020  10:46 AM  ? GFR 47.77 (L) 10/10/2020 09:51 AM  ?  ?Last diabetic Eye exam:  ?Lab Results  ?Component Value Date/Time  ? HMDIABEYEEXA No Retinopathy 01/01/2020 12:00 AM  ?  ?Last diabetic Foot exam: 12/28/19 - PCP  ? ?Lab Results  ?Component Value Date  ? CHOL 179 07/17/2019  ? HDL 37.70 (L)  07/17/2019  ? [place] 82 05/09/2014  ? LDLDIRECT 57.0 07/17/2019  ? TRIG (H) 07/17/2019  ?  484.0 Triglyceride is over 400; calculations on Lipids are invalid.  ? CHOLHDL 5 07/17/2019  ? ? ?Hepatic Function Latest Ref Rng & Units 07/17/2019 07/15/2018 08/18/2016  ?Total Protein 6.0 - 8.3 g/dL 7.0 6.9 7.7  ?Albumin 3.5 - 5.2 g/dL 3.9 3.8 3.9  ?AST 0 - 37 U/L 38(H) 19 30  ?ALT 0 - 53 U/L 33 27 29  ?Alk Phosphatase 39 - 117 U/L 37(L) 36(L) 36(L)  ?Total Bilirubin 0.2 - 1.2 mg/dL 0.6 0.5 0.7  ? ? ?Lab Results  ?Component Value Date/Time  ? TSH 4.48 07/11/2020 09:41 AM  ? ? ?CBC Latest Ref Rng & Units 07/11/2020 12/12/2019 07/15/2018  ?WBC 4.0 - 10.5 K/uL 7.3 8.7 7.4  ?Hemoglobin 13.0 - 17.0 g/dL 15.4 14.6 14.4  ?Hematocrit 39.0 - 52.0 % 45.0 43.9 41.6  ?Platelets 150.0 - 400.0 K/uL 316.0 348 262.0  ? ? ?No results found for: VD25OH ? ?Clinical ASCVD: No  ?The ASCVD Risk score (Arnett DK, et al., 2019) failed to calculate for the following reasons: ?  The 2019 ASCVD risk score is only valid for ages 67 to 33   ? ?Depression screen [facility] 2/9 03/26/2021 02/25/2021 02/19/2020  ?Decreased Interest 0 0 0  ?Down, Depressed, Hopeless 0 0 0  ?PHQ - 2 Score 0 0 0  ?Altered sleeping - - -  ?Tired, decreased energy - - -  ?Change in appetite - - -  ?Feeling bad or failure about yourself  - - -  ?Trouble concentrating - - -  ?Moving slowly or fidgety/restless - - -  ?Suicidal thoughts - - -  ?PHQ-9 Score - - -  ?Difficult doing work/chores - - -  ?Some recent data might be hidden  ?  ?Social History  ? ?Tobacco Use  ?Smoking Status Never  ?Smokeless Tobacco Never  ? ?BP Readings from Last 3 Encounters:  ?05/23/21 134/80  ?04/11/21 140/82  ?04/08/21 120/76  ? ?Pulse Readings from Last 3 Encounters:  ?05/23/21 91  ?04/11/21 94  ?04/03/21 79  ? ?Wt Readings from Last 3 Encounters:  ?05/23/21 252 lb (114.3 kg)  ?04/11/21 247 lb (112 kg)  ?04/08/21 248 lb 1.6 oz (112.5 kg)  ? ?BMI Readings from Last 3 Encounters:  ?05/23/21 34.18 kg/m?   ?04/11/21 33.50 kg/m?  ?04/08/21 33.65 kg/m?  ? ? ?Assessment/Interventions: Review of patient past medical history, allergies, medications, health status, including review of consultants reports, laboratory and other test data, was performed as part of comprehensive evaluation and provision of chronic care management services.  ? ?SDOH:  (Social Determinants of Health) assessments and interventions performed: DEC 2022 ? ?SDOH Screenings  ? ?Alcohol Screen: Low Risk   ? Last Alcohol Screening Score (AUDIT): 1  ?Depression (PHQ2-9): Low Risk   ? PHQ-2 Score: 0  ?Financial Resource Strain: Low Risk   ? Difficulty of Paying Living Expenses: Not hard at all  ?Food Insecurity: No Food Insecurity  ? Worried About Charity fundraiser in the Last Year: Never true  ? Ran Out of Food  in the Last Year: Never true  ?Housing: Low Risk   ? Last Housing Risk Score: 0  ?Physical Activity: Inactive  ? Days of Exercise per Week: 0 days  ? Minutes of Exercise per Session: 0 min  ?Social Connections: Moderately Isolated  ? Frequency of Communication with Friends and Family: Twice a week  ? Frequency of Social Gatherings with Friends and Family: Twice a week  ? Attends Religious Services: Never  ? Active Member of Clubs or Organizations: No  ? Attends Archivist Meetings: Never  ? Marital Status: Married  ?Stress: No Stress Concern Present  ? Feeling of Stress : Not at all  ?Tobacco Use: Low Risk   ? Smoking Tobacco Use: Never  ? Smokeless Tobacco Use: Never  ? Passive Exposure: Not on file  ?Transportation Needs: No Transportation Needs  ? Lack of Transportation (Medical): No  ? Lack of Transportation (Non-Medical): No  ? ? ? ? ?CCM Care Plan ? ?Allergies  ?Allergen Reactions  ? Morphine And Related Nausea And Vomiting  ?  Causes nausea & vomiting  ? Codeine Sulfate Other (See Comments)  ?  intolerant  ? Metformin And Related Other (See Comments)  ?  Gi intolerance, not an allergy  ? ? ?Medications Reviewed Today   ? ?  Reviewed by Jefferson Fuel, CMA (Certified Medical Assistant) on 05/23/21 at [phone]  Med List Status: <None>  ? ?Medication Order Taking? Sig Documenting Provider Last Dose Status Informant  ?allopurinol (ZYLOPRIM) 300 MG tablet

## 2021-06-16 DIAGNOSIS — H0102B Squamous blepharitis left eye, upper and lower eyelids: Secondary | ICD-10-CM | POA: Diagnosis not present

## 2021-06-16 DIAGNOSIS — E119 Type 2 diabetes mellitus without complications: Secondary | ICD-10-CM | POA: Diagnosis not present

## 2021-06-16 DIAGNOSIS — H04123 Dry eye syndrome of bilateral lacrimal glands: Secondary | ICD-10-CM | POA: Diagnosis not present

## 2021-06-16 DIAGNOSIS — H02135 Senile ectropion of left lower eyelid: Secondary | ICD-10-CM | POA: Diagnosis not present

## 2021-06-16 DIAGNOSIS — H02132 Senile ectropion of right lower eyelid: Secondary | ICD-10-CM | POA: Diagnosis not present

## 2021-06-16 DIAGNOSIS — H401123 Primary open-angle glaucoma, left eye, severe stage: Secondary | ICD-10-CM | POA: Diagnosis not present

## 2021-06-16 DIAGNOSIS — H0102A Squamous blepharitis right eye, upper and lower eyelids: Secondary | ICD-10-CM | POA: Diagnosis not present

## 2021-06-16 DIAGNOSIS — H2511 Age-related nuclear cataract, right eye: Secondary | ICD-10-CM | POA: Diagnosis not present

## 2021-06-16 DIAGNOSIS — H401111 Primary open-angle glaucoma, right eye, mild stage: Secondary | ICD-10-CM | POA: Diagnosis not present

## 2021-06-16 DIAGNOSIS — H43813 Vitreous degeneration, bilateral: Secondary | ICD-10-CM | POA: Diagnosis not present

## 2021-06-16 DIAGNOSIS — Z961 Presence of intraocular lens: Secondary | ICD-10-CM | POA: Diagnosis not present

## 2021-06-16 DIAGNOSIS — Z794 Long term (current) use of insulin: Secondary | ICD-10-CM | POA: Diagnosis not present

## 2021-06-19 ENCOUNTER — Other Ambulatory Visit: Payer: Self-pay

## 2021-06-19 ENCOUNTER — Ambulatory Visit (INDEPENDENT_AMBULATORY_CARE_PROVIDER_SITE_OTHER): Payer: HMO

## 2021-06-19 DIAGNOSIS — E1140 Type 2 diabetes mellitus with diabetic neuropathy, unspecified: Secondary | ICD-10-CM

## 2021-06-19 DIAGNOSIS — E1149 Type 2 diabetes mellitus with other diabetic neurological complication: Secondary | ICD-10-CM | POA: Diagnosis not present

## 2021-06-19 DIAGNOSIS — L84 Corns and callosities: Secondary | ICD-10-CM

## 2021-06-19 NOTE — Progress Notes (Signed)
SITUATION ?Reason for Visit: Fitting of Diabetic [place] ?Patient / Caregiver Report:  Patient is satisfied with fit and function of shoes and insoles. ? ?OBJECTIVE DATA: ?Patient History / Diagnosis:   ?  ICD-10-CM   ?1. Diabetic neuropathy with neurologic complication (HCC)  P32.95   ? E11.49   ?  ?2. Corns and callosities  L84   ?  ? ? ?Change in Status:   None ? ?ACTIONS PERFORMED: ?In-Person Delivery, patient was fit with: ?- 1x pair [id number] PDAC approved prefabricated Diabetic Shoes: [employer] Radiation protection practitioner ?- 3x pair [id number] PDAC approved [employer] custom diabetic insoles; SafeStep ? ?Shoes and insoles were verified for structural integrity and safety. Patient wore shoes and insoles in office. Skin was inspected and free of areas of concern after wearing shoes and inserts. Shoes and inserts fit properly. Patient / Caregiver provided with ferbal instruction and demonstration regarding donning, doffing, wear, care, proper fit, function, purpose, cleaning, and use of shoes and insoles ' and in all related precautions and risks and benefits regarding shoes and insoles. Patient / Caregiver was instructed to wear properly fitting socks with shoes at all times. Patient was also provided with verbal instruction regarding how to report any failures or malfunctions of shoes or inserts, and necessary follow up care. Patient / Caregiver was also instructed to contact physician regarding change in status that may affect function of shoes and inserts.  ? ?Patient / Caregiver verbalized undersatnding of instruction provided. Patient / Caregiver demonstrated independence with proper donning and doffing of shoes and inserts. ? ?PLAN ?Patient to follow with treating physician as recommended. Plan of care was discussed with and agreed upon by patient and/or caregiver. All questions were answered and concerns addressed. ? ?

## 2021-06-24 ENCOUNTER — Other Ambulatory Visit: Payer: Self-pay | Admitting: Family Medicine

## 2021-07-11 ENCOUNTER — Encounter: Payer: Self-pay | Admitting: Family

## 2021-07-11 ENCOUNTER — Ambulatory Visit (INDEPENDENT_AMBULATORY_CARE_PROVIDER_SITE_OTHER): Payer: HMO | Admitting: Family

## 2021-07-11 VITALS — BP 136/74 | HR 89 | Temp 98.0°F | Resp 16 | Ht 72.0 in | Wt 256.5 lb

## 2021-07-11 DIAGNOSIS — L03116 Cellulitis of left lower limb: Secondary | ICD-10-CM

## 2021-07-11 DIAGNOSIS — S91302A Unspecified open wound, left foot, initial encounter: Secondary | ICD-10-CM | POA: Diagnosis not present

## 2021-07-11 MED ORDER — MUPIROCIN 2 % EX OINT: 1.0000 "application " | TOPICAL_OINTMENT | Freq: Two times a day (BID) | CUTANEOUS | 0 refills | Status: DC

## 2021-07-11 MED ORDER — DOXYCYCLINE HYCLATE 100 MG PO TABS: 100.0000 mg | ORAL_TABLET | Freq: Two times a day (BID) | ORAL | 0 refills | Status: AC

## 2021-07-11 NOTE — Assessment & Plan Note (Signed)
rx doxycycline 100 mg ?rx mupirocin ointment 2% ?Monitor daily for s/s worsening infection ?F/u one week to monitor hopeful improvement in wound ? ?

## 2021-07-11 NOTE — Assessment & Plan Note (Signed)
Treating as for wound ?

## 2021-07-11 NOTE — Progress Notes (Signed)
? ?Established Patient Office Visit ? ?Subjective:  ?Patient ID: [name], male    DOB: 1940-04-23  Age: 81 y.o. MRN: [id number] ? ?CC:  ?Chief Complaint  ?Patient presents with  ? foot sore  ?  X 1.5 week sore is painful sometimes.  ? ? ?HPI ?[name] is here today with concerns.  ? ?1.5 weeks ago went for a pedicure went for a pedicure and they knicked him with something on his right anterior foot. Since then he has noticed increasing infection site on anterior foot that is tender, warm, with no drainage.  ? ?No fever or chills.  ?Pain sometimes with walking at the site of the wound.  ? ?Past Medical History:  ?Diagnosis Date  ? Arthritis   ? arthirtis all over  ? Bell's palsy   ? Cataract 2020  ? bilateral eyes -pending surgery June 2020  ? Concussion   ? multiple- college football player  ? Diabetes mellitus type 2 in obese [facility])   ? Frequent urination at night   ? GERD (gastroesophageal reflux disease)   ? occ indigestion  ? Glaucoma   ? both eyes  ? Hyperlipemia   ? Hypertension   ? Kidney stones   ? PONV (postoperative nausea and vomiting)   ? Primary localized osteoarthritis of right hip   ? Primary osteoarthritis of right knee   ? Restless leg syndrome   ? Right knee DJD   ? Spinal stenosis of lumbar region with radiculopathy   ? s/p L4/L5 transforaminal blocks  ? ? ?Past Surgical History:  ?Procedure Laterality Date  ? ABDOMINAL HERNIA REPAIR    ? ANTERIOR HIP REVISION Right 01/08/2015  ? Procedure: RIGHT ANTERIOR HIP REVISION;  Surgeon: Renette Butters, MD;  Location: [place];  Service: Orthopedics;  Laterality: Right;  ? BACK SURGERY    ? CANTHOPLASTY Right 02/09/2017  ? Procedure: RIGHT LATERAL CANTHOPLASTY;  Surgeon: Irene Limbo, MD;  Location: [place];  Service: Plastics;  Laterality: Right;  ? CANTHOPLASTY Left 06/29/2017  ? Procedure: LEFT LOWER CANTHOPLASTY;  Surgeon: Irene Limbo, MD;  Location: [place];  Service: Plastics;  Laterality:  Left;  ? CATARACT EXTRACTION Left   ? COLONOSCOPY    ? ELBOW ARTHROSCOPY  1990  ? right  ? ELBOW SURGERY    ? right  ? HERNIA REPAIR  1980  ? inguinal  ? HIP CLOSED REDUCTION Right 11/09/2014  ? Procedure: CLOSED REDUCTION HIP, s/p total hip 8/9;  Surgeon: Ninetta Lights, MD;  Location: [place];  Service: Orthopedics;  Laterality: Right;  ? JOINT REPLACEMENT  2008  ? knee  ? LITHOTRIPSY    ? LUMBAR DISC SURGERY    ? 2015  ? RECONSTRUCTION OF EYELID    ? REVISION TOTAL HIP ARTHROPLASTY Right 01/08/2015  ? SHOULDER ARTHROSCOPY  1980  ? right  ? SHOULDER SURGERY    ? right  ? STONE EXTRACTION WITH BASKET  2010  ? TOTAL HIP ARTHROPLASTY Right 11/06/2014  ? Procedure: RIGHT TOTAL HIP ARTHROPLASTY ANTERIOR APPROACH;  Surgeon: Renette Butters, MD;  Location: [place];  Service: Orthopedics;  Laterality: Right;  ? TOTAL KNEE ARTHROPLASTY Left 07/2006  ? left knee  ? TOTAL KNEE ARTHROPLASTY Right 01/08/2014  ? Procedure: RIGHT TOTAL KNEE ARTHROPLASTY;  Surgeon: Lorn Junes, MD;  Location: [place];  Service: Orthopedics;  Laterality: Right;  ? ? ?Family History  ?Problem Relation Age of Onset  ? Hypertension Mother   ?  Kidney disease Mother   ? Hypertension Father   ? Diabetes Mellitus II Father   ? Colon polyps Father   ? Diabetes Mellitus II Brother   ? Diabetes Mellitus II Brother   ? Prostate cancer Brother   ? Colon cancer Neg Hx   ? ? ?Social History  ? ?Socioeconomic History  ? Marital status: Married  ?  Spouse name: [name]  ? Number of children: 2  ? Years of education: Not on file  ? Highest education level: Not on file  ?Occupational History  ? Occupation: retired  ?Tobacco Use  ? Smoking status: Never  ? Smokeless tobacco: Never  ?Substance and Sexual Activity  ? Alcohol use: Yes  ?  Comment: glass of wine every 3-4 months  ? Drug use: No  ? Sexual activity: Never  ?Other Topics Concern  ? Not on file  ?Social History Narrative  ? Lives with wife  ? Right handed  ? Drinks no caffeine  ? ?Social Determinants of Health   ? ?Financial Resource Strain: Low Risk   ? Difficulty of Paying Living Expenses: Not hard at all  ?Food Insecurity: No Food Insecurity  ? Worried About Charity fundraiser in the Last Year: Never true  ? Ran Out of Food in the Last Year: Never true  ?Transportation Needs: No Transportation Needs  ? Lack of Transportation (Medical): No  ? Lack of Transportation (Non-Medical): No  ?Physical Activity: Inactive  ? Days of Exercise per Week: 0 days  ? Minutes of Exercise per Session: 0 min  ?Stress: No Stress Concern Present  ? Feeling of Stress : Not at all  ?Social Connections: Moderately Isolated  ? Frequency of Communication with Friends and Family: Twice a week  ? Frequency of Social Gatherings with Friends and Family: Twice a week  ? Attends Religious Services: Never  ? Active Member of Clubs or Organizations: No  ? Attends Archivist Meetings: Never  ? Marital Status: Married  ?Intimate Partner Violence: Not At Risk  ? Fear of Current or Ex-Partner: No  ? Emotionally Abused: No  ? Physically Abused: No  ? Sexually Abused: No  ? ? ?Outpatient Medications Prior to Visit  ?Medication Sig Dispense Refill  ? allopurinol (ZYLOPRIM) 300 MG tablet TAKE 1/2 TABLET BY MOUTH EVERY MORNING 45 tablet 3  ? amLODipine (NORVASC) 10 MG tablet TAKE 1 TABLET BY MOUTH DAILY 90 tablet 1  ? Ascorbic Acid (VITAMIN C WITH ROSE HIPS) 500 MG tablet Take 500 mg by mouth daily.    ? aspirin 81 MG tablet Take 81 mg by mouth daily.    ? atorvastatin (LIPITOR) 10 MG tablet TAKE 1 TABLET BY MOUTH EACH EVENING 90 tablet 3  ? blood glucose meter kit and supplies Dispense based on patient and insurance preference. Use up to four times daily as directed. (FOR ICD-10 E10.9, E11.9). 1 each 0  ? Continuous Blood Gluc Receiver (FREESTYLE LIBRE 2 READER) DEVI USE TO CHECK BLOOD SUGAR DAILY 1 each 0  ? Continuous Blood Gluc Sensor (FREESTYLE LIBRE 2 SENSOR) MISC USE AS DIRECTED BY PROVIDER 2 each 11  ? dapagliflozin propanediol (FARXIGA) 10 MG  TABS tablet Take 1 tablet (10 mg total) by mouth daily before breakfast. 90 tablet 3  ? diclofenac sodium (VOLTAREN) 1 % GEL Uses PRN    ? dorzolamide-timolol (COSOPT) 22.3-6.8 MG/ML ophthalmic solution Place 1 drop into the left eye 2 (two) times daily.     ? Dulaglutide (TRULICITY) 4.5  MG/0.5ML SOPN Inject 4.5 mg as directed once a week. 6 mL 3  ? gemfibrozil (LOPID) 600 MG tablet TAKE 1 TABLET BY MOUTH EVERY MORNING AND1 IN THE EVENING 180 tablet 3  ? glucose blood test strip Use as instructed to check blood glucose twice daily 100 each 6  ? insulin aspart (NOVOLOG) 100 UNIT/ML FlexPen Max daily 50 units 45 mL 3  ? insulin glargine, 2 Unit Dial, (TOUJEO MAX SOLOSTAR) 300 UNIT/ML Solostar Pen Inject 220 Units into the skin daily in the afternoon. 75 mL 3  ? Insulin Pen Needle 31G X 5 MM MISC 1 Device by Does not apply route in the morning, at noon, in the evening, and at bedtime. 400 each 3  ? latanoprost (XALATAN) 0.005 % ophthalmic solution Place 1 drop into both eyes at bedtime.    ? losartan (COZAAR) 100 MG tablet TAKE 1 TABLET BY MOUTH DAILY 90 tablet 2  ? meloxicam (MOBIC) 15 MG tablet TAKE 1/2 TO 1 TABLET BY MOUTH DAILY AS NEEDED FOR PAIN...TAKE WITH FOOD 30 tablet 5  ? methylcellulose (CITRUCEL) oral powder Take 1 packet by mouth daily.    ? metoprolol succinate (TOPROL-XL) 25 MG 24 hr tablet TAKE 1 TABLET BY MOUTH DAILY 90 tablet 1  ? Multiple Vitamins-Minerals (VITRUM 50+ SENIOR MULTI PO) Take 1 tablet by mouth daily.     ? Tadalafil 2.5 MG TABS TAKE 1 TABLET BY MOUTH DAILY 30 tablet 5  ? mupirocin ointment (BACTROBAN) 2 % Apply 1 application topically 2 (two) times daily. 22 g 1  ? ?No facility-administered medications prior to visit.  ? ? ?Allergies  ?Allergen Reactions  ? Morphine And Related Nausea And Vomiting  ?  Causes nausea & vomiting  ? Codeine Sulfate Other (See Comments)  ?  intolerant  ? Metformin And Related Other (See Comments)  ?  Gi intolerance, not an allergy  ? ? ?ROS ?Review of  Systems  ?Constitutional:  Negative for chills, fatigue and fever.  ?Respiratory:  Negative for shortness of breath.   ?Cardiovascular:  Positive for leg swelling (some increased swelling left lower leg does ha

## 2021-07-11 NOTE — Patient Instructions (Signed)
Antibiotic sent to preferred pharmacy. ?Ointment use twice daily.  ?Monitor for worsening signs of infection.   ? ?Please increase oral fluids, steamy hot shower/humidifier prn. ? ?Please follow up if no improvement in 2-3 days.  ? ?It was a pleasure seeing you today! Please do not hesitate to reach out with any questions and or concerns. ? ?Regards,  ? ?Zacharius Funari ? ?

## 2021-07-14 ENCOUNTER — Other Ambulatory Visit: Payer: Self-pay | Admitting: Family Medicine

## 2021-07-15 ENCOUNTER — Other Ambulatory Visit: Payer: Self-pay | Admitting: Family

## 2021-07-15 DIAGNOSIS — B379 Candidiasis, unspecified: Secondary | ICD-10-CM

## 2021-07-15 LAB — WOUND CULTURE
MICRO NUMBER:: 13265570
SPECIMEN QUALITY:: ADEQUATE

## 2021-07-15 MED ORDER — MICONAZOLE NITRATE 2 % EX CREA: 1.0000 "application " | TOPICAL_CREAM | Freq: Two times a day (BID) | CUTANEOUS | 0 refills | Status: DC

## 2021-07-15 NOTE — Progress Notes (Signed)
Wound culture came back positive for staph as well as with some yeast.  ?Continue antbx as prescribed as this medication is good for the particular bacteria.  ?Also I sent in miconazole cream which is antifungal cream, may or may not be covered by insurance and is otherwise over the counter. But put this on wound twice daily for yeast bacteria, use this instead of ointment prescribed at appt.

## 2021-08-06 ENCOUNTER — Telehealth: Payer: Self-pay

## 2021-08-06 NOTE — Chronic Care Management (AMB) (Signed)
? ? ?Chronic Care Management ?Pharmacy Assistant  ? ?Name: [name]  MRN: [id number] DOB: 01-11-1941 ? ? ?Reason for Encounter: Reminder Call ?  ?Conditions to be addressed/monitored: ?HTN, HLD, DMII, and CKD Stage 3a ? ?Office Visits: ?07/11/21-Tabitha Dugal,FNP(fam med) cellulitis left foot-start doxycycline 143m,mupirocin ointment 2%, ?04/03/21-Graham Duncan,Md(PCP)f/u urinary difficulty- f/u DM-Increase Novolog before meals to 12 units, may increase further to 15 units if BG remains > 200. Stopping FWilder Gladeis likely not going to help because he would probably have resultant increase in sugar that would still cause dysuria.   ?  ?Consult Visits: ?05/23/21-Ibtehal Shamleffer,MD(endo)-f/u DM-Increase Toujeo to 220 units  daily,f/u 3 months  ?06/19/21-Brian Little (orthotics)- diabetic shoes and insoles ?04/11/21-Ibtehal Shamleffer,MD(endocrin)-Initial visit evaluate DM.Increase Toujeo to 200 units  daily  ? ?Hospital Visits: None in 6 months ? ?Medications: ?Outpatient Encounter Medications as of 08/06/2021  ?Medication Sig  ? allopurinol (ZYLOPRIM) 300 MG tablet TAKE 1/2 TABLET BY MOUTH EVERY MORNING  ? amLODipine (NORVASC) 10 MG tablet TAKE 1 TABLET BY MOUTH DAILY  ? Ascorbic Acid (VITAMIN C WITH ROSE HIPS) 500 MG tablet Take 500 mg by mouth daily.  ? aspirin 81 MG tablet Take 81 mg by mouth daily.  ? atorvastatin (LIPITOR) 10 MG tablet TAKE 1 TABLET BY MOUTH EACH EVENING  ? blood glucose meter kit and supplies Dispense based on patient and insurance preference. Use up to four times daily as directed. (FOR ICD-10 E10.9, E11.9).  ? Continuous Blood Gluc Receiver (FREESTYLE LIBRE 2 READER) DEVI USE TO CHECK BLOOD SUGAR DAILY  ? Continuous Blood Gluc Sensor (FREESTYLE LIBRE 2 SENSOR) MISC USE AS DIRECTED BY PROVIDER  ? dapagliflozin propanediol (FARXIGA) 10 MG TABS tablet Take 1 tablet (10 mg total) by mouth daily before breakfast.  ? diclofenac sodium (VOLTAREN) 1 % GEL Uses PRN  ? dorzolamide-timolol (COSOPT)  22.3-6.8 MG/ML ophthalmic solution Place 1 drop into the left eye 2 (two) times daily.   ? Dulaglutide (TRULICITY) 4.5 MTD/3.2KGSOPN Inject 4.5 mg as directed once a week.  ? gemfibrozil (LOPID) 600 MG tablet TAKE 1 TABLET BY MOUTH EVERY MORNING AND1 IN THE EVENING  ? glucose blood test strip Use as instructed to check blood glucose twice daily  ? insulin aspart (NOVOLOG) 100 UNIT/ML FlexPen Max daily 50 units  ? insulin glargine, 2 Unit Dial, (TOUJEO MAX SOLOSTAR) 300 UNIT/ML Solostar Pen Inject 220 Units into the skin daily in the afternoon.  ? Insulin Pen Needle 31G X 5 MM MISC 1 Device by Does not apply route in the morning, at noon, in the evening, and at bedtime.  ? latanoprost (XALATAN) 0.005 % ophthalmic solution Place 1 drop into both eyes at bedtime.  ? losartan (COZAAR) 100 MG tablet TAKE 1 TABLET BY MOUTH DAILY  ? meloxicam (MOBIC) 15 MG tablet TAKE 1/2 TO 1 TABLET BY MOUTH DAILY AS NEEDED FOR PAIN...TAKE WITH FOOD  ? methylcellulose (CITRUCEL) oral powder Take 1 packet by mouth daily.  ? metoprolol succinate (TOPROL-XL) 25 MG 24 hr tablet TAKE 1 TABLET BY MOUTH DAILY  ? miconazole (MICATIN) 2 % cream Apply 1 application. topically 2 (two) times daily.  ? Multiple Vitamins-Minerals (VITRUM 50+ SENIOR MULTI PO) Take 1 tablet by mouth daily.   ? Tadalafil 2.5 MG TABS TAKE 1 TABLET BY MOUTH DAILY  ? ?No facility-administered encounter medications on file as of 08/06/2021.  ? ? ?TDulce Sellarwas contacted to remind of upcoming telephone visit with LCharlene Brookeon 08/11/21 at 8:45am. Patient was  reminded to have any blood glucose and blood pressure readings available for review at appointment.  ? ?Message was left reminding patient of appointment. ? ? ?Are you having any problems with your medications?  ?HOME DIABETES REGIMEN:(05/23/21) ?Trulicity 4.5 mg weekly  ?Farxiga 10 mg daily  ?Toujeo 220 mg daily  ?Novolog 15 units with each meal  ? ?Medication:  Last Fill: Day Supply ?Atorvastatin  5m 06/16/21 ?Farxiga 161m?Trulicity 4.1.8MCanufacturer ?Novolog ?Toujeo  ?Lantus   06/16/21 ?Losartan 10065m/20/23  ?Januvia 100m45m20/23  ?Ozempic   06/16/21 ? ? ?LindCharlene BrookeP notified ? ?Mitzy Naron, CCMA ?Health concierge  ?[phone]

## 2021-08-11 ENCOUNTER — Telehealth: Payer: Self-pay | Admitting: Pharmacist

## 2021-08-11 ENCOUNTER — Telehealth: Payer: HMO

## 2021-08-11 NOTE — Telephone Encounter (Signed)
Attempted to reach patient again as he spoke with office staff earlier today. No answer, left voicemail again. ?

## 2021-08-11 NOTE — Progress Notes (Deleted)
Chronic Care Management Pharmacy Note  08/11/2021 Name:  [name] MRN:  [id number] DOB:  [birth year]  Summary:  CCM F/U visit Pt reports first endocrinology visit went well. He increased Toujeo to 200 units and divides into 2 doses. Also taking Novolog 15 units with meals, TID. Reminded him about chart provided by endocrinology for Novolog correction dose at meals times. His wife found chart and they will start using.  His Elenor Legato 2  is working properly and scanning 3-4 times/day. His wife is getting more comfortable with the technology. Will check on status of Trulicity PAP renewal for 2023.   Recommendations:  Increase number of blood glucose scans/day Recommend updating lipid panel with next labs   Plan:  -[place] will call patient *** -Pharmacist follow up televisit scheduled for ***   Subjective: [name] is an 81 y.o. year old male who is a primary patient of Damita Dunnings, Elveria Rising, MD.  The CCM team was consulted for assistance with disease management and care coordination needs.    Engaged with patient by telephone for follow up visit in response to provider referral for pharmacy case management and/or care coordination services.   Consent to Services:  The patient was given information about Chronic Care Management services, agreed to services, and gave verbal consent prior to initiation of services.  Please see initial visit note for detailed documentation.   Patient Care Team: Tonia Ghent, MD as PCP - General (Family Medicine) Clent Jacks, MD as Consulting Physician (Ophthalmology) Renato Shin, MD as Consulting Physician (Endocrinology) Armbruster, Carlota Raspberry, MD as Consulting Physician (Gastroenterology) Irene Limbo, MD as Consulting Physician (Plastic Surgery) Garvin Fila, MD as Consulting Physician (Neurology) Debbora Dus, [facility] as Pharmacist (Pharmacist) Kristeen Miss, MD as Consulting Physician (Neurosurgery)  Recent office  visits: 07/11/21 NP Lawerance Bach Dugal OV: foot sore - rx'd doxycyline and miconazole cream.  04/03/2021 - Elsie Stain, MD - Pt presented for diabetes follow up. Increase Novolog before meals to 12 units, may increase further to 15 units if BG remains > 200. Take before all meals. Follow up with endocrinology.  Recent consult visits: 05/23/21 Dr Kelton Pillar (Endocrine): f/u DM. A1c 9.3. Increase Toujeo to 220 units/day. Emphasized compliance with Novolog + correction chart. F/U 3 months.   04/11/2021 Dr Kelton Pillar (Endocrine): Initial consult, increase Novolog to 15 units with each meal, plus correction. Continue other medication same. Follow up 6 weeks.  04/08/2021 - Johny Drilling, RN, CCM, CDCES, Nutrition - Wife reports difficulty with current FreeStyle sensor. Message that reads incompatable. She brought box from pharmacy. They gave patient FreeStyle Libre 1. He has been using Libre 2. Wife has [place] 2 app on her phone. She will return to pharmacy. Recommend split insulin dose (don't give 175 units to one area). Avoid sugar sweetened drinks (tea)   Hospital visits: {Hospital DC Yes/No:25215}   Objective:  Lab Results  Component Value Date   CREATININE 1.37 12/13/2020   BUN 29 (H) 12/13/2020   GFR 48.97 (L) 12/13/2020   GFRNONAA 40 (L) 12/12/2019   GFRAA 47 (L) 12/12/2019   NA 135 12/13/2020   K 3.8 12/13/2020   CALCIUM 10.1 12/13/2020   CO2 24 12/13/2020   GLUCOSE 207 (H) 12/13/2020    Lab Results  Component Value Date/Time   HGBA1C 9.3 (A) 05/23/2021 09:54 AM   HGBA1C 10.9 (A) 04/11/2021 09:01 AM   HGBA1C 9.9 (H) 07/17/2019 10:28 AM   HGBA1C 9.6 (H) 07/15/2018 09:35 AM   FRUCTOSAMINE 392 (  H) 07/11/2020 09:41 AM   GFR 48.97 (L) 12/13/2020 10:46 AM   GFR 47.77 (L) 10/10/2020 09:51 AM    Last diabetic Eye exam:  Lab Results  Component Value Date/Time   HMDIABEYEEXA No Retinopathy 01/01/2020 12:00 AM    Last diabetic Foot exam: No results found for: HMDIABFOOTEX   Lab  Results  Component Value Date   CHOL 179 07/17/2019   HDL 37.70 (L) 07/17/2019   LDLCALC 82 05/09/2014   LDLDIRECT 57.0 07/17/2019   TRIG (H) 07/17/2019    484.0 Triglyceride is over 400; calculations on Lipids are invalid.   CHOLHDL 5 07/17/2019       Latest Ref Rng & Units 07/17/2019   10:28 AM 07/15/2018    9:35 AM 08/18/2016    3:22 PM  Hepatic Function  Total Protein 6.0 - 8.3 g/dL 7.0   6.9   7.7    Albumin 3.5 - 5.2 g/dL 3.9   3.8   3.9    AST 0 - 37 U/L 38   19   30    ALT 0 - 53 U/L 33   27   29    Alk Phosphatase 39 - 117 U/L 37   36   36    Total Bilirubin 0.2 - 1.2 mg/dL 0.6   0.5   0.7      Lab Results  Component Value Date/Time   TSH 4.48 07/11/2020 09:41 AM       Latest Ref Rng & Units 07/11/2020    9:41 AM 12/12/2019    3:55 PM 07/15/2018    9:35 AM  CBC  WBC 4.0 - 10.5 K/uL 7.3   8.7   7.4    Hemoglobin 13.0 - 17.0 g/dL 15.4   14.6   14.4    Hematocrit 39.0 - 52.0 % 45.0   43.9   41.6    Platelets 150.0 - 400.0 K/uL 316.0   348   262.0      No results found for: VD25OH  Clinical ASCVD: {YES/NO:21197} The ASCVD Risk score (Arnett DK, et al., 2019) failed to calculate for the following reasons:   The 2019 ASCVD risk score is only valid for ages 28 to 109       03/26/2021    3:00 PM 02/25/2021    1:50 PM 02/19/2020   10:05 AM  Depression screen PHQ 2/9  Decreased Interest 0 0 0  Down, Depressed, Hopeless 0 0 0  PHQ - 2 Score 0 0 0     ***Other: (CHADS2VASc if Afib, MMRC or CAT for COPD, ACT, DEXA)  Social History   Tobacco Use  Smoking Status Never  Smokeless Tobacco Never   BP Readings from Last 3 Encounters:  07/11/21 136/74  05/23/21 134/80  04/11/21 140/82   Pulse Readings from Last 3 Encounters:  07/11/21 89  05/23/21 91  04/11/21 94   Wt Readings from Last 3 Encounters:  07/11/21 256 lb 8 oz (116.3 kg)  05/23/21 252 lb (114.3 kg)  04/11/21 247 lb (112 kg)   BMI Readings from Last 3 Encounters:  07/11/21 34.79 kg/m   05/23/21 34.18 kg/m  04/11/21 33.50 kg/m    Assessment/Interventions: Review of patient past medical history, allergies, medications, health status, including review of consultants reports, laboratory and other test data, was performed as part of comprehensive evaluation and provision of chronic care management services.   SDOH:  (Social Determinants of Health) assessments and interventions performed: {yes/no:20286}  SDOH Screenings   Alcohol  Screen: Low Risk    Last Alcohol Screening Score (AUDIT): 1  Depression (PHQ2-9): Low Risk    PHQ-2 Score: 0  Financial Resource Strain: Low Risk    Difficulty of Paying Living Expenses: Not hard at all  Food Insecurity: No Food Insecurity   Worried About Charity fundraiser in the Last Year: Never true   Ran Out of Food in the Last Year: Never true  Housing: Low Risk    Last Housing Risk Score: 0  Physical Activity: Inactive   Days of Exercise per Week: 0 days   Minutes of Exercise per Session: 0 min  Social Connections: Moderately Isolated   Frequency of Communication with Friends and Family: Twice a week   Frequency of Social Gatherings with Friends and Family: Twice a week   Attends Religious Services: Never   Marine scientist or Organizations: No   Attends Music therapist: Never   Marital Status: Married  Stress: No Stress Concern Present   Feeling of Stress : Not at all  Tobacco Use: Low Risk    Smoking Tobacco Use: Never   Smokeless Tobacco Use: Never   Passive Exposure: Not on file  Transportation Needs: No Transportation Needs   Lack of Transportation (Medical): No   Lack of Transportation (Non-Medical): No    CCM Care Plan  Allergies  Allergen Reactions   Morphine And Related Nausea And Vomiting    Causes nausea & vomiting   Codeine Sulfate Other (See Comments)    intolerant   Metformin And Related Other (See Comments)    Gi intolerance, not an allergy    Medications Reviewed Today      Reviewed by Eugenia Pancoast, FNP (Family Nurse Practitioner) on 07/11/21 at 1125  Med List Status: <None>   Medication Order Taking? Sig Documenting Provider Last Dose Status Informant  allopurinol (ZYLOPRIM) 300 MG tablet [id number] Yes TAKE 1/2 TABLET BY MOUTH EVERY MORNING Tonia Ghent, MD Taking Active   amLODipine (NORVASC) 10 MG tablet [id number] Yes TAKE 1 TABLET BY MOUTH DAILY Tonia Ghent, MD Taking Active   Ascorbic Acid (VITAMIN C WITH ROSE HIPS) 500 MG tablet [id number] Yes Take 500 mg by mouth daily. [provider] Taking Active   aspirin 81 MG tablet [id number] Yes Take 81 mg by mouth daily. [provider] Taking Active Self  atorvastatin (LIPITOR) 10 MG tablet [id number] Yes TAKE 1 TABLET BY MOUTH EACH EVENING Tonia Ghent, MD Taking Active   blood glucose meter kit and supplies [id number] Yes Dispense based on patient and insurance preference. Use up to four times daily as directed. (FOR ICD-10 E10.9, E11.9). Tonia Ghent, MD Taking Active   Continuous Blood Gluc Receiver (FREESTYLE LIBRE 2 READER) [place] [id number] Yes USE TO CHECK BLOOD SUGAR DAILY Tonia Ghent, MD Taking Active   Continuous Blood Gluc Sensor (FREESTYLE LIBRE 2 SENSOR) [place] [id number] Yes USE AS DIRECTED BY PROVIDER Tonia Ghent, MD Taking Active   dapagliflozin propanediol (FARXIGA) 10 MG TABS tablet [id number] Yes Take 1 tablet (10 mg total) by mouth daily before breakfast. Shamleffer, Melanie Crazier, MD Taking Active   diclofenac sodium (VOLTAREN) 1 % GEL [id number] Yes Uses PRN [provider] Taking Active   dorzolamide-timolol (COSOPT) 22.3-6.8 MG/ML ophthalmic solution [id number] Yes Place 1 drop into the left eye 2 (two) times daily.  [provider] Taking Active   doxycycline (VIBRA-TABS) 100 MG tablet [id number] Yes Take 1 tablet (100 mg total) by mouth 2 (  two) times daily for 10 days. Eugenia Pancoast, FNP  Active   Dulaglutide (TRULICITY) 4.5 CB/7.6EG SOPN  [id number] Yes Inject 4.5 mg as directed once a week. Shamleffer, Melanie Crazier, MD Taking Active   gemfibrozil (LOPID) 600 MG tablet [id number] Yes TAKE 1 TABLET BY MOUTH EVERY MORNING AND1 IN THE EVENING Tonia Ghent, MD Taking Active   glucose blood test strip [id number] Yes Use as instructed to check blood glucose twice daily Tonia Ghent, MD Taking Active   insulin aspart (NOVOLOG) 100 UNIT/ML FlexPen [id number] Yes Max daily 50 units Shamleffer, Melanie Crazier, MD Taking Active   insulin glargine, 2 Unit Dial, (TOUJEO MAX SOLOSTAR) 300 UNIT/ML Solostar Pen [id number] Yes Inject 220 Units into the skin daily in the afternoon. Shamleffer, Melanie Crazier, MD Taking Active   Insulin Pen Needle 31G X 5 MM MISC [id number] Yes 1 Device by Does not apply route in the morning, at noon, in the evening, and at bedtime. Shamleffer, Melanie Crazier, MD Taking Active   latanoprost (XALATAN) 0.005 % ophthalmic solution [id number] Yes Place 1 drop into both eyes at bedtime. [provider] Taking Active Self  losartan (COZAAR) 100 MG tablet [id number] Yes TAKE 1 TABLET BY MOUTH DAILY Tonia Ghent, MD Taking Active   meloxicam (MOBIC) 15 MG tablet [id number] Yes TAKE 1/2 TO 1 TABLET BY MOUTH DAILY AS NEEDED FOR PAIN...TAKE WITH FOOD Tonia Ghent, MD Taking Active   methylcellulose (CITRUCEL) oral powder [id number] Yes Take 1 packet by mouth daily. Yetta Flock, MD Taking Active   metoprolol succinate (TOPROL-XL) 25 MG 24 hr tablet [id number] Yes TAKE 1 TABLET BY MOUTH DAILY Tonia Ghent, MD Taking Active   Multiple Vitamins-Minerals (VITRUM 50+ SENIOR MULTI PO) [id number] Yes Take 1 tablet by mouth daily.  [provider] Taking Active Self  mupirocin ointment (BACTROBAN) 2 % [id number] Yes Apply 1 application. topically 2 (two) times daily for 7 days. Eugenia Pancoast, FNP  Active   Tadalafil 2.5 MG TABS [id number] Yes TAKE 1 TABLET BY MOUTH DAILY Tonia Ghent, MD Taking  Active             Patient Active Problem List   Diagnosis Date Noted   Open wound of left foot 07/11/2021   Cellulitis of left foot 07/11/2021   Type 2 diabetes mellitus with hyperglycemia, with long-term current use of insulin ([place]) 05/23/2021   Type 2 diabetes mellitus with stage 3a chronic kidney disease, with long-term current use of insulin ([place]) 05/23/2021   Dyslipidemia 04/11/2021   Dysuria 04/07/2021   Back pain 10/22/2019   Fall at home 09/27/2019   Advance care planning 07/25/2019   Pressure ulcer, buttock 04/17/2019   Skin irritation 03/29/2019   Gout 07/22/2018   HTN (hypertension) 06/11/2017   Hyperlipemia    Lower urinary tract symptoms (LUTS) 03/04/2017   Bell's palsy 10/11/2016   Paresthesia 05/28/2016   Medicare annual wellness visit, subsequent 04/22/2016   Cough 04/22/2016   Allergic rhinitis 03/12/2016   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   Erectile dysfunction 06/11/2015   Rhinorrhea 03/13/2015   S/P revision of total hip 01/10/2015   Dislocation, hip ([place]) 11/09/2014   DJD (degenerative joint disease) 11/06/2014   Primary osteoarthritis of right knee    Primary localized osteoarthritis of right hip    Spinal stenosis of lumbar region with radiculopathy    Diabetes mellitus type 2 in obese (HCC)    PONV (postoperative nausea and vomiting)    GERD (gastroesophageal reflux disease)  Frequent urination at night    Kidney stones    Arthritis    Neuromuscular disorder ([place])    Benign prostatic hyperplasia with urinary obstruction 07/14/2012   Chronic calculus cholecystitis 12/31/2011   Gouty arthropathy 03/01/2009    Immunization History  Administered Date(s) Administered   Fluad Quad(high Dose 65+) 12/28/2019, 01/28/2021   Influenza Split 01/30/2009, 02/07/2010   Influenza, High Dose Seasonal PF 12/17/2011, 01/14/2017   Influenza,inj,Quad PF,6+ Mos 02/17/2016, 01/19/2018   Influenza,inj,quad, With Preservative 12/01/2018   Influenza,trivalent,  recombinat, inj, PF 01/02/2011   Influenza-Unspecified 03/11/2015, 12/01/2018   PFIZER(Purple Top)SARS-COV-2 Vaccination 04/22/2019, 05/13/2019, 12/13/2019   PPD Test 08/04/2008   Pfizer Covid-19 Vaccine Bivalent Booster 5yr & up 10/31/2020   Pneumococcal Conjugate-13 02/07/2010, 03/26/2014   Pneumococcal Polysaccharide-23 02/07/2010   Td 04/04/2012   Tdap 03/26/2006   Zoster, Live 03/30/2009    Conditions to be addressed/monitored:  {USCCMDZASSESSMENTOPTIONS:23563}  There are no care plans that you recently modified to display for this patient.    Medication [place].   Compliance/Adherence/Medication fill history: Care Gaps: Diabetic eye exam due   Star Rating Drugs: Farxiga 129m- PDC 23%; LF 02/24/21 X 30DS Losartan 10064m 21%; LF 12/14/20 X 90 DS Atorvastatin 68m79mPDC 1%, LF 10/21/20 X 90 DS  [facility] [place]: [phone]: [phone] pill box? {Yes or If no, why not?:20788} Pt endorses ***% compliance  We discussed: {Pharmacy options:24294} Patient decided to: {US Pharmacy Plan:23885}  Care Plan and Follow Up Patient Decision:  {FOLLOWUP:24991}  Plan: {CM FOLLOW UP PLAN:25073}  ***    Current Barriers:  Unable to achieve control of diabetes     Pharmacist Clinical Goal(s):  Patient will adhere to plan to optimize therapeutic regimen for diabetes as evidenced by report of adherence to recommended medication management changes through collaboration with PharmD and provider.    Interventions: 1:1 collaboration with DuncTonia Ghent regarding development and update of comprehensive plan of care as evidenced by provider attestation and co-signature Inter-disciplinary care team collaboration (see longitudinal plan of care) Comprehensive medication  review performed; medication list updated in electronic medical record   Hypertension / CKD stage 3A (BP goal <140/90) -{US controlled/uncontrolled:25276} -Current treatment: Amlodipine 10 mg daily Losartan 100 mg daily Metoprolol succinate 25 mg daily -Medications previously tried: ***  -Current home readings: *** -Current dietary habits: *** -Current exercise habits: *** -{ACTIONS;DENIES/REPORTS:21021675::"Denies"} hypotensive/hypertensive symptoms -Educated on {CCM BP Counseling:25124} -Counseled to monitor BP at home ***, document, and provide log at future appointments -{CCMPHARMDINTERVENTION:25122}   Hyperlipidemia: (LDL goal < 70 -Query controlled - LDL 57, TRIG 484 (06/2019), TRIG was 943 in 2017 prior to fibrate; Due for re-eval. Poor adherence to statin per refill history, however, pt affirms daily adherence. May consider discontinuing gemfibrozil and maximizing statin dose due to risk of side effects and TG < 500. No history of statin intolerance. TG were 943 in 2017.  -Current treatment: Atorvastatin 10 mg daily  -  Gemfibrozil 600 mg  BID -   -Medications previously tried: none -Recommended to continue current medication; Recommend daily adherence to atorvastatin and improved diabetes control. Update lipid panel with next labs.   Diabetes (A1c goal <8% - necessary for back surgery) -Uncontrolled - A1c 9.3% (04/2021) improved from 10.9% (03/2021);  -Initial appointment completed with Dr. ShamKelton Pillar 04/11/20.  Pt reports increased Toujeo to 200 units (2 divided doses) and taking Novolog 15 units with meals. Reminded him about chart provided by endocrinology for Novolog correction at meals times. His wife found chart and they will start using.  His Elenor Legato 2  is working properly and scanning 3-4 times/day. His wife is getting more comfortable with the technology -Current home glucose readings - Freestyle Libre 2 Fasting ranging: 05/06/21 - 239 (after lunch) 05/05/21 - 208 (10:37 AM),  218 (11:30 AM)  Lowest 04/30/21 - 164 -Current medications: Farxiga 10 mg daily -  Toujeo Max Solostar 300 unit/mL - 220 units AM in divided doses Trulicity 4.5 m weekly (Sun) Novolog 15 units TID w/ meals (plus correction dose of 0-10 units)  -Medications previously tried: Januvia (replaced with Trulicity), Metformin (GI intolerance) -Recommend continue current medications and follow up with endocrinology   Patient Goals/Self-Care Activities Patient will:  - check glucose before meals and 2 hours after meals with Elenor Legato - bring [place] reader (phone) to all appointments

## 2021-08-11 NOTE — Telephone Encounter (Signed)
?  Chronic Care Management  ? ?Outreach Note ? ?08/11/2021 ?Name: OTTIE TILLERY MRN: [id number] DOB: [birth year] ? ?Referred by: Tonia Ghent, MD ? ?Patient had a phone appointment scheduled with clinical pharmacist today. ? ?An unsuccessful telephone outreach was attempted today. The patient was referred to the pharmacist for assistance with medications, care management and care coordination.  ? ?Patient will NOT be penalized in any way for missing a CCM appointment. The no-show fee does not apply. ? ?If possible, a message was left to return call to: [phone] or to [facility]. ? ?Charlene Brooke, PharmD, BCACP ?Clinical Pharmacist ?[facility] ?[phone] ? ? ?

## 2021-08-13 NOTE — Progress Notes (Signed)
Called patient to reschedule his missed telephone appointment with Charlene Brooke. No answer; left message. ? ?Charlene Brooke, CPP notified ? ?Marijean Niemann, RMA ?Clinical Pharmacy Assistant ?[phone] ?

## 2021-08-14 NOTE — Progress Notes (Signed)
Called patient to reschedule his missed telephone appointment with Charlene Brooke. No answer; left message.   Charlene Brooke, CPP notified   Marijean Niemann, [place] Clinical Pharmacy Assistant [phone]

## 2021-08-20 ENCOUNTER — Ambulatory Visit: Payer: HMO | Admitting: Internal Medicine

## 2021-10-01 ENCOUNTER — Encounter: Payer: Self-pay | Admitting: Internal Medicine

## 2021-10-01 ENCOUNTER — Ambulatory Visit: Payer: HMO | Admitting: Internal Medicine

## 2021-10-01 ENCOUNTER — Ambulatory Visit (INDEPENDENT_AMBULATORY_CARE_PROVIDER_SITE_OTHER): Payer: HMO | Admitting: Internal Medicine

## 2021-10-01 VITALS — BP 130/70 | HR 60 | Ht 72.0 in | Wt 259.0 lb

## 2021-10-01 DIAGNOSIS — N1831 Chronic kidney disease, stage 3a: Secondary | ICD-10-CM | POA: Diagnosis not present

## 2021-10-01 DIAGNOSIS — E1165 Type 2 diabetes mellitus with hyperglycemia: Secondary | ICD-10-CM | POA: Diagnosis not present

## 2021-10-01 DIAGNOSIS — E1122 Type 2 diabetes mellitus with diabetic chronic kidney disease: Secondary | ICD-10-CM

## 2021-10-01 DIAGNOSIS — Z794 Long term (current) use of insulin: Secondary | ICD-10-CM | POA: Diagnosis not present

## 2021-10-01 LAB — POCT GLYCOSYLATED HEMOGLOBIN (HGB A1C): Hemoglobin A1C: 7.7 % — AB (ref 4.0–5.6)

## 2021-10-01 NOTE — Progress Notes (Signed)
Name: [name]  MRN/ DOB: [id number], 21-Jan-1941   Age/ Sex: 81 y.o., male    PCP: Tonia Ghent, MD   Reason for Endocrinology Evaluation: Type 2 Diabetes Mellitus     Date of Initial Endocrinology Visit: 04/11/2021    PATIENT IDENTIFIER: [name] is a 81 y.o. male with a past medical history of T2DM, HTN and Dyslipidemia . The patient presented for initial endocrinology clinic visit on 04/11/2021 for consultative assistance with his diabetes management.    HPI: [name] was    Diagnosed with DM yrs ago Prior Medications tried/Intolerance: INtolerant to Metformin              Hemoglobin A1c has ranged from 7.7% in 2019, peaking at 11.9% in 2022.     On his initial visit to our clinic he was on Trulicity, farxiga, toujeo and Novolog with an A1c 10.9% . We adjusted MDI regimen and continued trulicity, farxiga , we also provided him with a correction scale    SUBJECTIVE:   During the last visit (04/11/2021): A1c 9.3% We adjusted MDI regimen, continued Truicity and [place]   Today (10/01/21): [name] is here for a follow up on diabetes management. He checks his blood sugars multiple times daily through CGM. The patient has had hypoglycemic episodes since the last clinic visit.  He received care for lower extremity cellulitis in April 2023  Per spouse he is not consistently taking prandial insulin   Denies nausea, vomiting or diarrhea   HOME DIABETES REGIMEN: Trulicity 4.5 mg weekly  Farxiga 10 mg daily  Toujeo 220 mg daily  Novolog 15 units with each meal  Correction Factor: Novolog (BG-130/25)       Statin: yes ACE-I/ARB: yes Prior Diabetic Education: yes   CONTINUOUS GLUCOSE MONITORING RECORD INTERPRETATION    Dates of Recording: 6/22-10/01/2021  Sensor description: freestyle libre   Results statistics:   CGM use % of time 31  Average and SD 150/27.1  Time in range   76 %  % Time Above 180 24  % Time above 250 0  % Time Below  target 0      Glycemic patterns summary: Optimaly BG's through the day and night   Hyperglycemic episodes  postprandial  Hypoglycemic episodes occurred n/a  Overnight periods: trends down       DIABETIC COMPLICATIONS: Microvascular complications:  CKD III Denies: neuropathy  Last eye exam: Completed 2022 - Dr. Katy Fitch   Macrovascular complications:   Denies: CAD, PVD, CVA   PAST HISTORY: Past Medical History:  Past Medical History:  Diagnosis Date   Arthritis    arthirtis all over   Bell's palsy    Cataract 2020   bilateral eyes -pending surgery June 2020   Concussion    multiple- college football player   Diabetes mellitus type 2 in obese ([place])    Frequent urination at night    GERD (gastroesophageal reflux disease)    occ indigestion   Glaucoma    both eyes   Hyperlipemia    Hypertension    Kidney stones    PONV (postoperative nausea and vomiting)    Primary localized osteoarthritis of right hip    Primary osteoarthritis of right knee    Restless leg syndrome    Right knee DJD    Spinal stenosis of lumbar region with radiculopathy    s/p L4/L5 transforaminal blocks   Past Surgical History:  Past Surgical History:  Procedure Laterality Date   ABDOMINAL  HERNIA REPAIR     ANTERIOR HIP REVISION Right 01/08/2015   Procedure: RIGHT ANTERIOR HIP REVISION;  Surgeon: Renette Butters, MD;  Location: [place] [facility];  Service: Orthopedics;  Laterality: Right;   BACK SURGERY     CANTHOPLASTY Right 02/09/2017   Procedure: RIGHT LATERAL CANTHOPLASTY;  Surgeon: Irene Limbo, MD;  Location: [facility];  Service: Plastics;  Laterality: Right;   CANTHOPLASTY Left 06/29/2017   Procedure: LEFT LOWER CANTHOPLASTY;  Surgeon: Irene Limbo, MD;  Location: [place];  Service: Plastics;  Laterality: Left;   CATARACT EXTRACTION Left    COLONOSCOPY     ELBOW ARTHROSCOPY  1990   right   ELBOW SURGERY     right   HERNIA REPAIR  1980    inguinal   HIP CLOSED REDUCTION Right 11/09/2014   Procedure: CLOSED REDUCTION HIP, s/p total hip 8/9;  Surgeon: Ninetta Lights, MD;  Location: [facility];  Service: Orthopedics;  Laterality: Right;   JOINT REPLACEMENT  2008   knee   LITHOTRIPSY     LUMBAR DISC SURGERY     2015   RECONSTRUCTION OF EYELID     REVISION TOTAL HIP ARTHROPLASTY Right 01/08/2015   SHOULDER ARTHROSCOPY  1980   right   SHOULDER SURGERY     right   STONE EXTRACTION WITH BASKET  2010   TOTAL HIP ARTHROPLASTY Right 11/06/2014   Procedure: RIGHT TOTAL HIP ARTHROPLASTY ANTERIOR APPROACH;  Surgeon: Renette Butters, MD;  Location: [facility];  Service: Orthopedics;  Laterality: Right;   TOTAL KNEE ARTHROPLASTY Left 07/2006   left knee   TOTAL KNEE ARTHROPLASTY Right 01/08/2014   Procedure: RIGHT TOTAL KNEE ARTHROPLASTY;  Surgeon: Lorn Junes, MD;  Location: [place];  Service: Orthopedics;  Laterality: Right;    Social History:  reports that he has never smoked. He has never used smokeless tobacco. He reports current alcohol use. He reports that he does not use drugs. Family History:  Family History  Problem Relation Age of Onset   Hypertension Mother    Kidney disease Mother    Hypertension Father    Diabetes Mellitus II Father    Colon polyps Father    Diabetes Mellitus II Brother    Diabetes Mellitus II Brother    Prostate cancer Brother    Colon cancer Neg Hx      HOME MEDICATIONS: Allergies as of 10/01/2021       Reactions   Morphine And Related Nausea And Vomiting   Causes nausea & vomiting   Codeine Sulfate Other (See Comments)   intolerant   Metformin And Related Other (See Comments)   Gi intolerance, not an allergy        Medication List        Accurate as of October 01, 2021 12:02 PM. If you have any questions, ask your nurse or doctor.          allopurinol 300 MG tablet Commonly known as: ZYLOPRIM TAKE 1/2 TABLET BY MOUTH EVERY MORNING   amLODipine 10 MG tablet Commonly known as:  NORVASC TAKE 1 TABLET BY MOUTH DAILY   aspirin 81 MG tablet Take 81 mg by mouth daily.   atorvastatin 10 MG tablet Commonly known as: LIPITOR TAKE 1 TABLET BY MOUTH EACH EVENING   blood glucose meter kit and supplies Dispense based on patient and insurance preference. Use up to four times daily as directed. (FOR ICD-10 E10.9, E11.9).   Citrucel oral powder Generic drug: methylcellulose Take  1 packet by mouth daily.   dapagliflozin propanediol 10 MG Tabs tablet Commonly known as: Farxiga Take 1 tablet (10 mg total) by mouth daily before breakfast.   diclofenac sodium 1 % Gel Commonly known as: VOLTAREN Uses PRN   dorzolamide-timolol 22.3-6.8 MG/ML ophthalmic solution Commonly known as: COSOPT Place 1 drop into the left eye 2 (two) times daily.   FreeStyle Libre 2 Reader Kerrin Mo USE TO CHECK BLOOD SUGAR DAILY   FreeStyle Libre 2 Sensor Misc USE AS DIRECTED BY PROVIDER   gemfibrozil 600 MG tablet Commonly known as: LOPID TAKE 1 TABLET BY MOUTH EVERY MORNING AND1 IN THE EVENING   glucose blood test strip Use as instructed to check blood glucose twice daily   insulin aspart 100 UNIT/ML FlexPen Commonly known as: NOVOLOG Max daily 50 units   Insulin Pen Needle 31G X 5 MM Misc 1 Device by Does not apply route in the morning, at noon, in the evening, and at bedtime.   latanoprost 0.005 % ophthalmic solution Commonly known as: XALATAN Place 1 drop into both eyes at bedtime.   losartan 100 MG tablet Commonly known as: COZAAR TAKE 1 TABLET BY MOUTH DAILY   meloxicam 15 MG tablet Commonly known as: MOBIC TAKE 1/2 TO 1 TABLET BY MOUTH DAILY AS NEEDED FOR PAIN...TAKE WITH FOOD   metoprolol succinate 25 MG 24 hr tablet Commonly known as: TOPROL-XL TAKE 1 TABLET BY MOUTH DAILY   miconazole 2 % cream Commonly known as: Micatin Apply 1 application. topically 2 (two) times daily.   Tadalafil 2.5 MG Tabs TAKE 1 TABLET BY MOUTH DAILY   Toujeo Max SoloStar 300 UNIT/ML  Solostar Pen Generic drug: insulin glargine (2 Unit Dial) Inject 220 Units into the skin daily in the afternoon.   Trulicity 4.5 EZ/6.6QH Sopn Generic drug: Dulaglutide Inject 4.5 mg as directed once a week.   vitamin C with rose hips 500 MG tablet Take 500 mg by mouth daily.   VITRUM 50+ SENIOR MULTI PO Take 1 tablet by mouth daily.         ALLERGIES: Allergies  Allergen Reactions   Morphine And Related Nausea And Vomiting    Causes nausea & vomiting   Codeine Sulfate Other (See Comments)    intolerant   Metformin And Related Other (See Comments)    Gi intolerance, not an allergy    OBJECTIVE:   VITAL SIGNS: BP 130/70 (BP Location: Left Arm, Patient Position: Sitting, Cuff Size: Large)   Pulse 60   Ht 6' (1.829 m)   Wt 259 lb (117.5 kg)   SpO2 94%   BMI 35.13 kg/m    PHYSICAL EXAM:  General: Pt appears well and is in NAD  Neck: General: Supple without adenopathy or carotid bruits. Thyroid: Thyroid size normal.  No goiter or nodules appreciated.   Lungs: Clear with good BS bilat with no rales, rhonchi, or wheezes  Heart: RRR   Extremities:  Lower extremities - trace  pretibial edema  Neuro: MS is good with appropriate affect, pt is alert and Ox3      DATA REVIEWED:  Lab Results  Component Value Date   HGBA1C 7.7 (A) 10/01/2021   HGBA1C 9.3 (A) 05/23/2021   HGBA1C 10.9 (A) 04/11/2021   Lab Results  Component Value Date   LDLCALC 82 05/09/2014   CREATININE 1.37 12/13/2020   No results found for: "MICRALBCREAT"  Lab Results  Component Value Date   CHOL 179 07/17/2019   HDL 37.70 (L) 07/17/2019  LDLCALC 82 05/09/2014   LDLDIRECT 57.0 07/17/2019   TRIG (H) 07/17/2019    484.0 Triglyceride is over 400; calculations on Lipids are invalid.   CHOLHDL 5 07/17/2019        ASSESSMENT / PLAN / RECOMMENDATIONS:   1) Type 2 Diabetes Mellitus, with improving glucose control , With CKD III complications - Most recent A1c of 7.7  %. Goal A1c < 8.0 %.      - A1c down from 9.3 %  - He continues with inconsistent intake of prandial insulin. Per wife he is visually impaired and unable to see the scale, he also tends to forget to use it  - I am going to stop the correction scale and give him a standing dose of prandial dose of insulin to take if pre-meal BG > 100 mg/dL to simplify it   MEDICATIONS: Continue Trulicity 4.5 mg weekly  Continue Farxiga 10 mg, 1 tablet daily  Continue Toujeo to 220 units  daily  Change Novolog 10 units WITH each meal    EDUCATION / INSTRUCTIONS: BG monitoring instructions: Patient is instructed to check his blood sugars 3 times a day, before each meal. Call [facility] if: BG persistently < 70  I reviewed the Rule of 15 for the treatment of hypoglycemia in detail with the patient. Literature supplied.   2) Diabetic complications:  Eye: Does not have known diabetic retinopathy.  Neuro/ Feet: Does not have known diabetic peripheral neuropathy. Renal: Patient does  have known baseline CKD. He is  on an ACEI/ARB at present.   F/U in 6 months   Signed electronically by: Mack Guise, MD  [facility]  [facility] [address] [place], [place] [address] Phone: [phone] FAX: [phone]   CC: Tonia Ghent, MD [place] [address] Phone: [phone]  Fax: [phone]    Return to Endocrinology clinic as below: No future appointments.

## 2021-10-01 NOTE — Patient Instructions (Addendum)
-   A1c 7.7 %  - Continue Toujeo 220 units  daily  - Continue Trulicity 4.5 mg weekly  - Continue Farxiga 10 mg, 1 tablet daily  - Change Novolog 10 units WITH each meal       HOW TO TREAT LOW BLOOD SUGARS (Blood sugar LESS THAN 70 MG/DL) Please follow the RULE OF 15 for the treatment of hypoglycemia treatment (when your (blood sugars are less than 70 mg/dL)   STEP 1: Take 15 grams of carbohydrates when your blood sugar is low, which includes:  3-4 GLUCOSE TABS  OR 3-4 OZ OF JUICE OR REGULAR SODA OR ONE TUBE OF GLUCOSE GEL    STEP 2: RECHECK blood sugar in 15 MINUTES STEP 3: If your blood sugar is still low at the 15 minute recheck --> then, go back to STEP 1 and treat AGAIN with another 15 grams of carbohydrates.

## 2021-10-09 ENCOUNTER — Other Ambulatory Visit: Payer: Self-pay

## 2021-10-09 MED ORDER — TADALAFIL 2.5 MG PO TABS: 1.0000 | ORAL_TABLET | Freq: Every day | ORAL | 5 refills | Status: AC

## 2021-10-09 NOTE — Progress Notes (Signed)
Received faxed request for Tadalafil 2.5 mg tablets. Rx sent to [facility].

## 2021-10-27 DIAGNOSIS — H04123 Dry eye syndrome of bilateral lacrimal glands: Secondary | ICD-10-CM | POA: Diagnosis not present

## 2021-10-27 DIAGNOSIS — Z794 Long term (current) use of insulin: Secondary | ICD-10-CM | POA: Diagnosis not present

## 2021-10-27 DIAGNOSIS — H02132 Senile ectropion of right lower eyelid: Secondary | ICD-10-CM | POA: Diagnosis not present

## 2021-10-27 DIAGNOSIS — Z961 Presence of intraocular lens: Secondary | ICD-10-CM | POA: Diagnosis not present

## 2021-10-27 DIAGNOSIS — H0102A Squamous blepharitis right eye, upper and lower eyelids: Secondary | ICD-10-CM | POA: Diagnosis not present

## 2021-10-27 DIAGNOSIS — H2511 Age-related nuclear cataract, right eye: Secondary | ICD-10-CM | POA: Diagnosis not present

## 2021-10-27 DIAGNOSIS — H43813 Vitreous degeneration, bilateral: Secondary | ICD-10-CM | POA: Diagnosis not present

## 2021-10-27 DIAGNOSIS — H401111 Primary open-angle glaucoma, right eye, mild stage: Secondary | ICD-10-CM | POA: Diagnosis not present

## 2021-10-27 DIAGNOSIS — E119 Type 2 diabetes mellitus without complications: Secondary | ICD-10-CM | POA: Diagnosis not present

## 2021-10-27 DIAGNOSIS — H0102B Squamous blepharitis left eye, upper and lower eyelids: Secondary | ICD-10-CM | POA: Diagnosis not present

## 2021-10-27 DIAGNOSIS — H401123 Primary open-angle glaucoma, left eye, severe stage: Secondary | ICD-10-CM | POA: Diagnosis not present

## 2021-10-27 DIAGNOSIS — H02135 Senile ectropion of left lower eyelid: Secondary | ICD-10-CM | POA: Diagnosis not present

## 2021-11-04 DIAGNOSIS — H401123 Primary open-angle glaucoma, left eye, severe stage: Secondary | ICD-10-CM | POA: Diagnosis not present

## 2021-11-11 ENCOUNTER — Encounter: Payer: Self-pay | Admitting: Family Medicine

## 2021-11-11 ENCOUNTER — Ambulatory Visit (INDEPENDENT_AMBULATORY_CARE_PROVIDER_SITE_OTHER): Payer: HMO | Admitting: Family Medicine

## 2021-11-11 DIAGNOSIS — E669 Obesity, unspecified: Secondary | ICD-10-CM | POA: Diagnosis not present

## 2021-11-11 DIAGNOSIS — E1169 Type 2 diabetes mellitus with other specified complication: Secondary | ICD-10-CM

## 2021-11-11 DIAGNOSIS — W19XXXA Unspecified fall, initial encounter: Secondary | ICD-10-CM | POA: Diagnosis not present

## 2021-11-11 DIAGNOSIS — Y92009 Unspecified place in unspecified non-institutional (private) residence as the place of occurrence of the external cause: Secondary | ICD-10-CM | POA: Diagnosis not present

## 2021-11-11 DIAGNOSIS — L03116 Cellulitis of left lower limb: Secondary | ICD-10-CM | POA: Diagnosis not present

## 2021-11-11 DIAGNOSIS — S91302A Unspecified open wound, left foot, initial encounter: Secondary | ICD-10-CM | POA: Diagnosis not present

## 2021-11-11 DIAGNOSIS — R238 Other skin changes: Secondary | ICD-10-CM | POA: Diagnosis not present

## 2021-11-11 MED ORDER — MUPIROCIN 2 % EX OINT: 1.0000 | TOPICAL_OINTMENT | Freq: Two times a day (BID) | CUTANEOUS | 1 refills | Status: DC

## 2021-11-11 MED ORDER — INSULIN ASPART 100 UNIT/ML FLEXPEN: PEN_INJECTOR | SUBCUTANEOUS | 3 refills | Status: AC

## 2021-11-11 NOTE — Progress Notes (Unsigned)
He had eye clinic eval recently, d/w pt.  He had f/u pending.    DM2 per endo.  A1c improved.  Discussed with pt about checking with Dr. Ellene Route.  He is putting up with back pain, which is intermittent.  He may be able to proceed with surgery now that his A1c is better, but I want him to get neurosurgery input.  Skin lesion on the gluteal crease.  Not open currently but had been in the past.  He was told he didn't need a referral back to the wound clinic; he can call for an appointment if needed.  No fevers, no chills, no drainage from the area.    He fell about 1 week ago.  Missed the chair when trying to sit down.  That was about 6:30 AM.  Wife got up and found him ~8:30.  Fire department helped him up.  Didn't have ER eval.    Discussed having a night light for when he is getting up early- he has enough light in the room already.  No other falls and recently.  He wasn't lightheaded.  "I just missed the chair."    Meds, vitals, and allergies reviewed.   ROS: Per HPI unless specifically indicated in ROS section   Nad Ncat Neck supple no LA Rrr Ctab Abd soft, not ttp He has minimal gluteal skin irritation, left side greater than right side, without ulceration or tissue undermining.  No abscess.

## 2021-11-11 NOTE — Patient Instructions (Addendum)
Ask Dr. Ellene Route about options, since your A1c is 7.7 on 10/01/21.  Take care.  Glad to see you. Try to offload when sitting and use Bactroban for a few days.   Update me as needed.

## 2021-11-12 NOTE — Assessment & Plan Note (Signed)
Reasonable to use mupirocin on the affected area for a few days to try to get this to heal over.  Discussed offloading and limiting tissue compression.  Addressing his back and increasing his mobility may help in the long run.  Discussed.

## 2021-11-12 NOTE — Assessment & Plan Note (Signed)
Fall cautions discussed with patient.  I think it makes sense for him to talk to Dr. Ellene Route to see what can be done about his back to see if that would help with his mobility.

## 2021-11-17 DIAGNOSIS — H43813 Vitreous degeneration, bilateral: Secondary | ICD-10-CM | POA: Diagnosis not present

## 2021-11-17 DIAGNOSIS — H02132 Senile ectropion of right lower eyelid: Secondary | ICD-10-CM | POA: Diagnosis not present

## 2021-11-17 DIAGNOSIS — H2511 Age-related nuclear cataract, right eye: Secondary | ICD-10-CM | POA: Diagnosis not present

## 2021-11-17 DIAGNOSIS — H0102A Squamous blepharitis right eye, upper and lower eyelids: Secondary | ICD-10-CM | POA: Diagnosis not present

## 2021-11-17 DIAGNOSIS — E119 Type 2 diabetes mellitus without complications: Secondary | ICD-10-CM | POA: Diagnosis not present

## 2021-11-17 DIAGNOSIS — H401111 Primary open-angle glaucoma, right eye, mild stage: Secondary | ICD-10-CM | POA: Diagnosis not present

## 2021-11-17 DIAGNOSIS — H401123 Primary open-angle glaucoma, left eye, severe stage: Secondary | ICD-10-CM | POA: Diagnosis not present

## 2021-11-17 DIAGNOSIS — Z961 Presence of intraocular lens: Secondary | ICD-10-CM | POA: Diagnosis not present

## 2021-11-17 DIAGNOSIS — H02135 Senile ectropion of left lower eyelid: Secondary | ICD-10-CM | POA: Diagnosis not present

## 2021-11-17 DIAGNOSIS — H0102B Squamous blepharitis left eye, upper and lower eyelids: Secondary | ICD-10-CM | POA: Diagnosis not present

## 2021-11-17 DIAGNOSIS — H04123 Dry eye syndrome of bilateral lacrimal glands: Secondary | ICD-10-CM | POA: Diagnosis not present

## 2021-11-17 DIAGNOSIS — Z794 Long term (current) use of insulin: Secondary | ICD-10-CM | POA: Diagnosis not present

## 2021-11-23 ENCOUNTER — Emergency Department (HOSPITAL_COMMUNITY): Payer: HMO

## 2021-11-23 ENCOUNTER — Other Ambulatory Visit: Payer: Self-pay

## 2021-11-23 ENCOUNTER — Inpatient Hospital Stay (HOSPITAL_COMMUNITY)
Admission: EM | Admit: 2021-11-23 | Discharge: 2021-11-28 | DRG: 640 | Disposition: A | Discharge status: Skilled Nursing Facility | Payer: HMO | Attending: Internal Medicine | Admitting: Internal Medicine

## 2021-11-23 ENCOUNTER — Encounter (HOSPITAL_COMMUNITY): Payer: Self-pay | Admitting: Emergency Medicine

## 2021-11-23 DIAGNOSIS — E1122 Type 2 diabetes mellitus with diabetic chronic kidney disease: Secondary | ICD-10-CM | POA: Diagnosis present

## 2021-11-23 DIAGNOSIS — Z66 Do not resuscitate: Secondary | ICD-10-CM | POA: Diagnosis not present

## 2021-11-23 DIAGNOSIS — N390 Urinary tract infection, site not specified: Secondary | ICD-10-CM

## 2021-11-23 DIAGNOSIS — Z20822 Contact with and (suspected) exposure to covid-19: Secondary | ICD-10-CM | POA: Diagnosis present

## 2021-11-23 DIAGNOSIS — Z7189 Other specified counseling: Secondary | ICD-10-CM | POA: Diagnosis not present

## 2021-11-23 DIAGNOSIS — N179 Acute kidney failure, unspecified: Secondary | ICD-10-CM | POA: Diagnosis present

## 2021-11-23 DIAGNOSIS — E11649 Type 2 diabetes mellitus with hypoglycemia without coma: Secondary | ICD-10-CM | POA: Diagnosis not present

## 2021-11-23 DIAGNOSIS — E7800 Pure hypercholesterolemia, unspecified: Secondary | ICD-10-CM | POA: Diagnosis present

## 2021-11-23 DIAGNOSIS — E860 Dehydration: Secondary | ICD-10-CM | POA: Diagnosis present

## 2021-11-23 DIAGNOSIS — Z789 Other specified health status: Secondary | ICD-10-CM | POA: Diagnosis not present

## 2021-11-23 DIAGNOSIS — Z833 Family history of diabetes mellitus: Secondary | ICD-10-CM

## 2021-11-23 DIAGNOSIS — Z841 Family history of disorders of kidney and ureter: Secondary | ICD-10-CM

## 2021-11-23 DIAGNOSIS — Z79899 Other long term (current) drug therapy: Secondary | ICD-10-CM

## 2021-11-23 DIAGNOSIS — L89309 Pressure ulcer of unspecified buttock, unspecified stage: Secondary | ICD-10-CM | POA: Diagnosis present

## 2021-11-23 DIAGNOSIS — R296 Repeated falls: Secondary | ICD-10-CM | POA: Diagnosis present

## 2021-11-23 DIAGNOSIS — K219 Gastro-esophageal reflux disease without esophagitis: Secondary | ICD-10-CM | POA: Diagnosis present

## 2021-11-23 DIAGNOSIS — Z794 Long term (current) use of insulin: Secondary | ICD-10-CM | POA: Diagnosis not present

## 2021-11-23 DIAGNOSIS — E872 Acidosis, unspecified: Secondary | ICD-10-CM | POA: Diagnosis present

## 2021-11-23 DIAGNOSIS — Z7985 Long-term (current) use of injectable non-insulin antidiabetic drugs: Secondary | ICD-10-CM

## 2021-11-23 DIAGNOSIS — R918 Other nonspecific abnormal finding of lung field: Secondary | ICD-10-CM | POA: Diagnosis not present

## 2021-11-23 DIAGNOSIS — A419 Sepsis, unspecified organism: Principal | ICD-10-CM

## 2021-11-23 DIAGNOSIS — Z9181 History of falling: Secondary | ICD-10-CM

## 2021-11-23 DIAGNOSIS — J690 Pneumonitis due to inhalation of food and vomit: Secondary | ICD-10-CM | POA: Diagnosis present

## 2021-11-23 DIAGNOSIS — N401 Enlarged prostate with lower urinary tract symptoms: Secondary | ICD-10-CM | POA: Diagnosis present

## 2021-11-23 DIAGNOSIS — N200 Calculus of kidney: Secondary | ICD-10-CM | POA: Diagnosis not present

## 2021-11-23 DIAGNOSIS — R54 Age-related physical debility: Secondary | ICD-10-CM | POA: Diagnosis present

## 2021-11-23 DIAGNOSIS — Z8371 Family history of colonic polyps: Secondary | ICD-10-CM

## 2021-11-23 DIAGNOSIS — L89152 Pressure ulcer of sacral region, stage 2: Secondary | ICD-10-CM | POA: Diagnosis present

## 2021-11-23 DIAGNOSIS — Z7982 Long term (current) use of aspirin: Secondary | ICD-10-CM

## 2021-11-23 DIAGNOSIS — R32 Unspecified urinary incontinence: Secondary | ICD-10-CM | POA: Diagnosis present

## 2021-11-23 DIAGNOSIS — Z8249 Family history of ischemic heart disease and other diseases of the circulatory system: Secondary | ICD-10-CM

## 2021-11-23 DIAGNOSIS — E785 Hyperlipidemia, unspecified: Secondary | ICD-10-CM | POA: Diagnosis present

## 2021-11-23 DIAGNOSIS — N138 Other obstructive and reflux uropathy: Secondary | ICD-10-CM | POA: Diagnosis present

## 2021-11-23 DIAGNOSIS — Z981 Arthrodesis status: Secondary | ICD-10-CM | POA: Diagnosis not present

## 2021-11-23 DIAGNOSIS — R238 Other skin changes: Secondary | ICD-10-CM | POA: Diagnosis present

## 2021-11-23 DIAGNOSIS — Z885 Allergy status to narcotic agent status: Secondary | ICD-10-CM

## 2021-11-23 DIAGNOSIS — Z515 Encounter for palliative care: Secondary | ICD-10-CM

## 2021-11-23 DIAGNOSIS — I10 Essential (primary) hypertension: Secondary | ICD-10-CM | POA: Diagnosis present

## 2021-11-23 DIAGNOSIS — Z8042 Family history of malignant neoplasm of prostate: Secondary | ICD-10-CM

## 2021-11-23 DIAGNOSIS — W19XXXA Unspecified fall, initial encounter: Secondary | ICD-10-CM | POA: Diagnosis present

## 2021-11-23 DIAGNOSIS — I129 Hypertensive chronic kidney disease with stage 1 through stage 4 chronic kidney disease, or unspecified chronic kidney disease: Secondary | ICD-10-CM | POA: Diagnosis present

## 2021-11-23 DIAGNOSIS — Z791 Long term (current) use of non-steroidal anti-inflammatories (NSAID): Secondary | ICD-10-CM

## 2021-11-23 DIAGNOSIS — G2581 Restless legs syndrome: Secondary | ICD-10-CM | POA: Diagnosis present

## 2021-11-23 DIAGNOSIS — E669 Obesity, unspecified: Secondary | ICD-10-CM | POA: Diagnosis present

## 2021-11-23 DIAGNOSIS — N1831 Chronic kidney disease, stage 3a: Secondary | ICD-10-CM | POA: Diagnosis present

## 2021-11-23 DIAGNOSIS — E8770 Fluid overload, unspecified: Secondary | ICD-10-CM | POA: Diagnosis not present

## 2021-11-23 DIAGNOSIS — R531 Weakness: Secondary | ICD-10-CM | POA: Diagnosis not present

## 2021-11-23 DIAGNOSIS — J189 Pneumonia, unspecified organism: Secondary | ICD-10-CM

## 2021-11-23 DIAGNOSIS — J984 Other disorders of lung: Secondary | ICD-10-CM | POA: Diagnosis not present

## 2021-11-23 DIAGNOSIS — H409 Unspecified glaucoma: Secondary | ICD-10-CM | POA: Diagnosis present

## 2021-11-23 DIAGNOSIS — R059 Cough, unspecified: Secondary | ICD-10-CM | POA: Diagnosis not present

## 2021-11-23 DIAGNOSIS — K429 Umbilical hernia without obstruction or gangrene: Secondary | ICD-10-CM | POA: Diagnosis not present

## 2021-11-23 DIAGNOSIS — E1165 Type 2 diabetes mellitus with hyperglycemia: Secondary | ICD-10-CM | POA: Diagnosis present

## 2021-11-23 DIAGNOSIS — R627 Adult failure to thrive: Secondary | ICD-10-CM | POA: Diagnosis present

## 2021-11-23 DIAGNOSIS — J9811 Atelectasis: Secondary | ICD-10-CM | POA: Diagnosis not present

## 2021-11-23 DIAGNOSIS — M1611 Unilateral primary osteoarthritis, right hip: Secondary | ICD-10-CM | POA: Diagnosis present

## 2021-11-23 DIAGNOSIS — M1711 Unilateral primary osteoarthritis, right knee: Secondary | ICD-10-CM | POA: Diagnosis present

## 2021-11-23 DIAGNOSIS — I700 Atherosclerosis of aorta: Secondary | ICD-10-CM | POA: Diagnosis not present

## 2021-11-23 DIAGNOSIS — Z96653 Presence of artificial knee joint, bilateral: Secondary | ICD-10-CM | POA: Diagnosis present

## 2021-11-23 DIAGNOSIS — Z888 Allergy status to other drugs, medicaments and biological substances status: Secondary | ICD-10-CM

## 2021-11-23 DIAGNOSIS — E876 Hypokalemia: Secondary | ICD-10-CM | POA: Diagnosis present

## 2021-11-23 DIAGNOSIS — Z87442 Personal history of urinary calculi: Secondary | ICD-10-CM

## 2021-11-23 DIAGNOSIS — Z6834 Body mass index (BMI) 34.0-34.9, adult: Secondary | ICD-10-CM

## 2021-11-23 LAB — CBC WITH DIFFERENTIAL/PLATELET
Abs Immature Granulocytes: 0.1 10*3/uL — ABNORMAL HIGH (ref 0.00–0.07)
Basophils Absolute: 0 10*3/uL (ref 0.0–0.1)
Basophils Relative: 0 %
Eosinophils Absolute: 0 10*3/uL (ref 0.0–0.5)
Eosinophils Relative: 0 %
HCT: 51.1 % (ref 39.0–52.0)
Hemoglobin: 17.3 g/dL — ABNORMAL HIGH (ref 13.0–17.0)
Immature Granulocytes: 1 %
Lymphocytes Relative: 6 %
Lymphs Abs: 0.9 10*3/uL (ref 0.7–4.0)
MCH: 29.8 pg (ref 26.0–34.0)
MCHC: 33.9 g/dL (ref 30.0–36.0)
MCV: 88 fL (ref 80.0–100.0)
Monocytes Absolute: 1.3 10*3/uL — ABNORMAL HIGH (ref 0.1–1.0)
Monocytes Relative: 8 %
Neutro Abs: 14.2 10*3/uL — ABNORMAL HIGH (ref 1.7–7.7)
Neutrophils Relative %: 85 %
Platelets: 233 10*3/uL (ref 150–400)
RBC: 5.81 MIL/uL (ref 4.22–5.81)
RDW: 15.5 % (ref 11.5–15.5)
WBC: 16.6 10*3/uL — ABNORMAL HIGH (ref 4.0–10.5)
nRBC: 0 % (ref 0.0–0.2)

## 2021-11-23 LAB — URINALYSIS, ROUTINE W REFLEX MICROSCOPIC
Bilirubin Urine: NEGATIVE
Glucose, UA: >=500 mg/dL — AB
Hgb urine dipstick: NEGATIVE
Ketones, ur: 20 mg/dL — AB
Nitrite: NEGATIVE
Protein, ur: 100 mg/dL — AB
Specific Gravity, Urine: 1.026 (ref 1.005–1.030)
pH: 5 (ref 5.0–8.0)

## 2021-11-23 LAB — RESP PANEL BY RT-PCR (FLU A&B, COVID) ARPGX2
Influenza A by PCR: NEGATIVE
Influenza B by PCR: NEGATIVE
SARS Coronavirus 2 by RT PCR: NEGATIVE

## 2021-11-23 LAB — COMPREHENSIVE METABOLIC PANEL
ALT: 28 U/L (ref 0–44)
AST: 21 U/L (ref 15–41)
Albumin: 2.8 g/dL — ABNORMAL LOW (ref 3.5–5.0)
Alkaline Phosphatase: 64 U/L (ref 38–126)
Anion gap: 12 (ref 5–15)
BUN: 20 mg/dL (ref 8–23)
CO2: 21 mmol/L — ABNORMAL LOW (ref 22–32)
Calcium: 8.8 mg/dL — ABNORMAL LOW (ref 8.9–10.3)
Chloride: 103 mmol/L (ref 98–111)
Creatinine, Ser: 1.47 mg/dL — ABNORMAL HIGH (ref 0.61–1.24)
GFR, Estimated: 48 mL/min — ABNORMAL LOW (ref >60)
Glucose, Bld: 236 mg/dL — ABNORMAL HIGH (ref 70–99)
Potassium: 3.8 mmol/L (ref 3.5–5.1)
Sodium: 136 mmol/L (ref 135–145)
Total Bilirubin: 2 mg/dL — ABNORMAL HIGH (ref 0.3–1.2)
Total Protein: 7.2 g/dL (ref 6.5–8.1)

## 2021-11-23 LAB — CBG MONITORING, ED
Glucose-Capillary: 246 mg/dL — ABNORMAL HIGH (ref 70–99)
Glucose-Capillary: 255 mg/dL — ABNORMAL HIGH (ref 70–99)

## 2021-11-23 LAB — LACTIC ACID, PLASMA
Lactic Acid, Venous: 3.1 mmol/L (ref 0.5–1.9)
Lactic Acid, Venous: 1.3 mmol/L (ref 0.5–1.9)

## 2021-11-23 LAB — PROTIME-INR
INR: 1.3 — ABNORMAL HIGH (ref 0.8–1.2)
Prothrombin Time: 15.7 seconds — ABNORMAL HIGH (ref 11.4–15.2)

## 2021-11-23 MED ORDER — ALLOPURINOL 300 MG PO TABS
150.0000 mg | ORAL_TABLET | Freq: Every day | ORAL | Status: DC
  Administered 2021-11-24 – 2021-11-28 (×5): 150 mg via ORAL
  Filled 2021-11-23 (×2): qty 1
  Filled 2021-11-23: qty 0.5
  Filled 2021-11-23 (×2): qty 1

## 2021-11-23 MED ORDER — METOPROLOL SUCCINATE ER 25 MG PO TB24
25.0000 mg | ORAL_TABLET | Freq: Every day | ORAL | Status: DC
  Administered 2021-11-24 – 2021-11-28 (×5): 25 mg via ORAL
  Filled 2021-11-23 (×5): qty 1

## 2021-11-23 MED ORDER — ACETAMINOPHEN 325 MG PO TABS
650.0000 mg | ORAL_TABLET | Freq: Four times a day (QID) | ORAL | Status: DC | PRN
  Administered 2021-11-25 – 2021-11-27 (×2): 650 mg via ORAL
  Filled 2021-11-23 (×2): qty 2

## 2021-11-23 MED ORDER — DORZOLAMIDE HCL-TIMOLOL MAL 2-0.5 % OP SOLN
1.0000 [drp] | Freq: Two times a day (BID) | OPHTHALMIC | Status: DC
  Administered 2021-11-25 – 2021-11-28 (×8): 1 [drp] via OPHTHALMIC
  Filled 2021-11-23 (×2): qty 10

## 2021-11-23 MED ORDER — ONDANSETRON HCL 4 MG PO TABS: 4.0000 mg | ORAL_TABLET | Freq: Four times a day (QID) | ORAL | Status: DC | PRN

## 2021-11-23 MED ORDER — LACTATED RINGERS IV SOLN: INTRAVENOUS | Status: DC

## 2021-11-23 MED ORDER — SODIUM CHLORIDE 0.9 % IV SOLN: INTRAVENOUS | Status: DC

## 2021-11-23 MED ORDER — NYSTATIN 100000 UNIT/GM EX POWD
Freq: Two times a day (BID) | CUTANEOUS | Status: DC
  Filled 2021-11-23 (×2): qty 15

## 2021-11-23 MED ORDER — BISACODYL 5 MG PO TBEC: 5.0000 mg | DELAYED_RELEASE_TABLET | Freq: Every day | ORAL | Status: DC | PRN

## 2021-11-23 MED ORDER — LACTATED RINGERS IV BOLUS
1000.0000 mL | Freq: Once | INTRAVENOUS | Status: AC
  Administered 2021-11-23: 1000 mL via INTRAVENOUS

## 2021-11-23 MED ORDER — ATORVASTATIN CALCIUM 10 MG PO TABS
10.0000 mg | ORAL_TABLET | Freq: Every evening | ORAL | Status: DC
  Administered 2021-11-23 – 2021-11-27 (×5): 10 mg via ORAL
  Filled 2021-11-23 (×5): qty 1

## 2021-11-23 MED ORDER — GUAIFENESIN ER 600 MG PO TB12: 600.0000 mg | ORAL_TABLET | Freq: Two times a day (BID) | ORAL | Status: DC | PRN

## 2021-11-23 MED ORDER — DOCUSATE SODIUM 100 MG PO CAPS
100.0000 mg | ORAL_CAPSULE | Freq: Two times a day (BID) | ORAL | Status: DC
  Administered 2021-11-23 – 2021-11-28 (×10): 100 mg via ORAL
  Filled 2021-11-23 (×10): qty 1

## 2021-11-23 MED ORDER — HYDRALAZINE HCL 20 MG/ML IJ SOLN: 5.0000 mg | INTRAMUSCULAR | Status: DC | PRN

## 2021-11-23 MED ORDER — ONDANSETRON HCL 4 MG/2ML IJ SOLN: 4.0000 mg | Freq: Four times a day (QID) | INTRAMUSCULAR | Status: DC | PRN

## 2021-11-23 MED ORDER — TADALAFIL 5 MG PO TABS
2.5000 mg | ORAL_TABLET | Freq: Every day | ORAL | Status: DC
  Administered 2021-11-24 – 2021-11-28 (×5): 2.5 mg via ORAL
  Filled 2021-11-23 (×5): qty 1

## 2021-11-23 MED ORDER — POLYETHYLENE GLYCOL 3350 17 G PO PACK
17.0000 g | PACK | Freq: Every day | ORAL | Status: DC | PRN
Start: 2021-11-23 — End: 2021-11-28

## 2021-11-23 MED ORDER — ASPIRIN 81 MG PO TBEC
81.0000 mg | DELAYED_RELEASE_TABLET | Freq: Every day | ORAL | Status: DC
  Administered 2021-11-24 – 2021-11-28 (×5): 81 mg via ORAL
  Filled 2021-11-23 (×6): qty 1

## 2021-11-23 MED ORDER — ENOXAPARIN SODIUM 60 MG/0.6ML IJ SOSY
55.0000 mg | PREFILLED_SYRINGE | INTRAMUSCULAR | Status: DC
Start: 2021-11-23 — End: 2021-11-28
  Administered 2021-11-23 – 2021-11-27 (×5): 55 mg via SUBCUTANEOUS
  Filled 2021-11-23 (×6): qty 0.6

## 2021-11-23 MED ORDER — SODIUM CHLORIDE 0.9 % IV SOLN
500.0000 mg | INTRAVENOUS | Status: DC
  Administered 2021-11-23 – 2021-11-24 (×2): 500 mg via INTRAVENOUS
  Filled 2021-11-23 (×3): qty 5

## 2021-11-23 MED ORDER — GEMFIBROZIL 600 MG PO TABS
600.0000 mg | ORAL_TABLET | Freq: Two times a day (BID) | ORAL | Status: DC
  Administered 2021-11-23 – 2021-11-28 (×9): 600 mg via ORAL
  Filled 2021-11-23 (×12): qty 1

## 2021-11-23 MED ORDER — SODIUM CHLORIDE 0.9 % IV SOLN
2.0000 g | INTRAVENOUS | Status: AC
  Administered 2021-11-23 – 2021-11-27 (×5): 2 g via INTRAVENOUS
  Filled 2021-11-23 (×5): qty 20

## 2021-11-23 MED ORDER — ALBUTEROL SULFATE (2.5 MG/3ML) 0.083% IN NEBU: 2.5000 mg | INHALATION_SOLUTION | RESPIRATORY_TRACT | Status: DC | PRN

## 2021-11-23 MED ORDER — LATANOPROST 0.005 % OP SOLN
1.0000 [drp] | Freq: Every day | OPHTHALMIC | Status: DC
  Administered 2021-11-25 – 2021-11-27 (×4): 1 [drp] via OPHTHALMIC
  Filled 2021-11-23 (×2): qty 2.5

## 2021-11-23 MED ORDER — AMLODIPINE BESYLATE 10 MG PO TABS
10.0000 mg | ORAL_TABLET | Freq: Every day | ORAL | Status: DC
  Administered 2021-11-24 – 2021-11-28 (×5): 10 mg via ORAL
  Filled 2021-11-23: qty 2
  Filled 2021-11-23 (×4): qty 1

## 2021-11-23 MED ORDER — INSULIN GLARGINE-YFGN 100 UNIT/ML ~~LOC~~ SOLN
50.0000 [IU] | Freq: Two times a day (BID) | SUBCUTANEOUS | Status: DC
Start: 2021-11-23 — End: 2021-11-25
  Administered 2021-11-23 – 2021-11-24 (×3): 50 [IU] via SUBCUTANEOUS
  Filled 2021-11-23 (×5): qty 0.5

## 2021-11-23 MED ORDER — ACETAMINOPHEN 650 MG RE SUPP: 650.0000 mg | Freq: Four times a day (QID) | RECTAL | Status: DC | PRN

## 2021-11-23 MED ORDER — SODIUM CHLORIDE 0.9% FLUSH
3.0000 mL | Freq: Two times a day (BID) | INTRAVENOUS | Status: DC
  Administered 2021-11-23 – 2021-11-28 (×8): 3 mL via INTRAVENOUS

## 2021-11-23 NOTE — ED Provider Notes (Signed)
[facility] EMERGENCY DEPARTMENT Provider Note   CSN: [id number] Arrival date & time: 11/23/21  1126     History  Chief Complaint  Patient presents with   Foul-smelling urine   Flank Pain    [name] is a 81 y.o. male.   Flank Pain     This patient is imaging and there is some creatinine history of generalized weakness known to have a history of diabetes, hypertension, hypercholesterolemia, he takes insulin.  He has had some generalized weakness which is gradually worsening over the last several weeks.  In fact he had a fall that required the fire department coming out to the hospital for left lower leg around 3 weeks ago.  It happened again 2 days ago.  Then happened again today when he slipped his, he was standing when urine around.  His significant other who is a primary historian with him states that 2 days ago he started having excessive coughing and shortness of breath with gurgling though that seems to have improved slightly today.  Has been reported by the spells and oriented the paramedics and went on to have stable tenderness and foul-smelling urine, she called her doctor's office today who recommended evaluation in the clinic in the ER.  EKG thankfully.     Home Medications Prior to Admission medications   Medication Sig Start Date End Date Taking? Authorizing Provider  allopurinol (ZYLOPRIM) 300 MG tablet TAKE 1/2 TABLET BY MOUTH EVERY MORNING Patient taking differently: Take 150 mg by mouth daily. 05/06/21  Yes Tonia Ghent, MD  amLODipine (NORVASC) 10 MG tablet TAKE 1 TABLET BY MOUTH DAILY Patient taking differently: Take 10 mg by mouth daily. 04/30/21  Yes Tonia Ghent, MD  aspirin 81 MG tablet Take 81 mg by mouth daily.   Yes [provider]  atorvastatin (LIPITOR) 10 MG tablet TAKE 1 TABLET BY MOUTH EACH EVENING Patient taking differently: Take 10 mg by mouth every evening. 11/25/20  Yes Tonia Ghent, MD  dapagliflozin  propanediol (FARXIGA) 10 MG TABS tablet Take 1 tablet (10 mg total) by mouth daily before breakfast. 04/11/21  Yes Shamleffer, Melanie Crazier, MD  diclofenac sodium (VOLTAREN) 1 % GEL Apply 2 g topically daily as needed (For pain). 09/20/18  Yes [provider]  dorzolamide-timolol (COSOPT) 22.3-6.8 MG/ML ophthalmic solution Place 1 drop into the left eye 2 (two) times daily.  01/06/18  Yes [provider]  Dulaglutide (TRULICITY) 4.5 JI/9.6VE SOPN Inject 4.5 mg as directed once a week. 04/11/21  Yes Shamleffer, Melanie Crazier, MD  gemfibrozil (LOPID) 600 MG tablet TAKE 1 TABLET BY MOUTH EVERY MORNING AND1 IN THE EVENING Patient taking differently: Take 600 mg by mouth 2 (two) times daily before a meal. 06/24/21  Yes Tonia Ghent, MD  insulin aspart (NOVOLOG) 100 UNIT/ML FlexPen 10 units with meals Patient taking differently: Inject 10 Units into the skin 3 (three) times daily with meals. 11/11/21  Yes Tonia Ghent, MD  insulin glargine, 2 Unit Dial, (TOUJEO MAX SOLOSTAR) 300 UNIT/ML Solostar Pen Inject 220 Units into the skin daily in the afternoon. 05/23/21  Yes Shamleffer, Melanie Crazier, MD  latanoprost (XALATAN) 0.005 % ophthalmic solution Place 1 drop into both eyes at bedtime.   Yes [provider]  losartan (COZAAR) 100 MG tablet TAKE 1 TABLET BY MOUTH DAILY Patient taking differently: Take 50 mg by mouth daily. 04/01/21  Yes Tonia Ghent, MD  meloxicam (MOBIC) 15 MG tablet TAKE 1/2 TO  1 TABLET BY MOUTH DAILY AS NEEDED FOR PAIN...TAKE WITH FOOD Patient taking differently: Take 7.5 mg by mouth daily as needed for pain. 03/21/21  Yes Tonia Ghent, MD  metoprolol succinate (TOPROL-XL) 25 MG 24 hr tablet TAKE 1 TABLET BY MOUTH DAILY Patient taking differently: Take 25 mg by mouth daily. 07/14/21  Yes Tonia Ghent, MD  Multiple Vitamins-Minerals (VITRUM 50+ SENIOR MULTI PO) Take 1 tablet by mouth daily.    Yes [provider]  Tadalafil 2.5 MG  TABS Take 1 tablet (2.5 mg total) by mouth daily. 10/09/21  Yes Tonia Ghent, MD  blood glucose meter kit and supplies Dispense based on patient and insurance preference. Use up to four times daily as directed. (FOR ICD-10 E10.9, E11.9). 03/14/21   Tonia Ghent, MD  Continuous Blood Gluc Receiver (FREESTYLE LIBRE 2 READER) DEVI USE TO CHECK BLOOD SUGAR DAILY 10/29/20   Tonia Ghent, MD  Continuous Blood Gluc Sensor (FREESTYLE LIBRE 2 SENSOR) MISC USE AS DIRECTED BY PROVIDER 03/14/21   Tonia Ghent, MD  glucose blood test strip Use as instructed to check blood glucose twice daily 03/14/21   Tonia Ghent, MD  Insulin Pen Needle 31G X 5 MM MISC 1 Device by Does not apply route in the morning, at noon, in the evening, and at bedtime. 04/11/21   Shamleffer, Melanie Crazier, MD  methylcellulose (CITRUCEL) oral powder Take 1 packet by mouth daily. Patient not taking: Reported on 11/23/2021 02/16/20   Yetta Flock, MD  miconazole (MICATIN) 2 % cream Apply 1 application. topically 2 (two) times daily. Patient not taking: Reported on 11/23/2021 07/15/21   Eugenia Pancoast, FNP  mupirocin ointment (BACTROBAN) 2 % Apply 1 Application topically 2 (two) times daily. Patient not taking: Reported on 11/23/2021 11/11/21   Tonia Ghent, MD      Allergies    Morphine and related, Codeine sulfate, and Metformin and related    Review of Systems   Review of Systems  Genitourinary:  Positive for flank pain.  All other systems reviewed and are negative.   Physical Exam Updated Vital Signs BP (!) 148/84 (BP Location: Right Arm)   Pulse 97   Temp 98.5 F (36.9 C) (Oral)   Resp (!) 24   Ht 1.829 m (6')   Wt 114.3 kg   SpO2 94%   BMI 34.18 kg/m  Physical Exam Vitals and nursing note reviewed.  Constitutional:      General: He is not in acute distress.    Appearance: He is well-developed.     Comments: Generally weak, no having is awake and able to follow my commands appears to some  degree  HENT:     Head: Normocephalic and atraumatic.     Nose: Nose normal.     Mouth/Throat:     Mouth: Mucous membranes are dry.     Pharynx: No oropharyngeal exudate.  Eyes:     General: No scleral icterus.       Right eye: No discharge.        Left eye: No discharge.     Conjunctiva/sclera: Conjunctivae normal.     Pupils: Pupils are equal, round, and reactive to light.  Neck:     Thyroid: No thyromegaly.     Vascular: No JVD.  Cardiovascular:     Rate and Rhythm: Regular rhythm. Tachycardia present.     Heart sounds: Normal heart sounds. No murmur heard.    No friction rub. No gallop.  Comments: Heart rate is 100 on my exam, normal pulses, no JVD Pulmonary:     Effort: Pulmonary effort is normal. No respiratory distress.     Breath sounds: Rales present. No wheezing.     Comments: Mildly tachypneic at 22 breaths/min, bibasilar rales are present, he is able to speak in full sentences Abdominal:     General: Bowel sounds are normal. There is no distension.     Palpations: Abdomen is soft. There is no mass.     Tenderness: There is no abdominal tenderness.  Musculoskeletal:        General: No tenderness. Normal range of motion.     Cervical back: Normal range of motion and neck supple.     Right lower leg: No edema.     Left lower leg: No edema.     Comments: Prior knee replacement surgical scars well healed. - supple joints - no significant edema  Lymphadenopathy:     Cervical: No cervical adenopathy.  Skin:    General: Skin is warm and dry.     Findings: No erythema or rash.  Neurological:     Mental Status: He is alert.     Coordination: Coordination normal.     Comments: The patient is able to sit up in the [place] unable valuate himself.  Patient he appears mildly somnolent but is able to answer questions.  There is no facial asymmetry, speech is clear  Psychiatric:        Behavior: Behavior normal.     ED Results / Procedures / Treatments   Labs (all  labs ordered are listed, but only abnormal results are displayed) Labs Reviewed  COMPREHENSIVE METABOLIC PANEL - Abnormal; Notable for the following components:      Result Value   CO2 21 (*)    Glucose, Bld 236 (*)    Creatinine, Ser 1.47 (*)    Calcium 8.8 (*)    Albumin 2.8 (*)    Total Bilirubin 2.0 (*)    GFR, Estimated 48 (*)    All other components within normal limits  CBC WITH DIFFERENTIAL/PLATELET - Abnormal; Notable for the following components:   WBC 16.6 (*)    Hemoglobin 17.3 (*)    Neutro Abs 14.2 (*)    Monocytes Absolute 1.3 (*)    Abs Immature Granulocytes 0.10 (*)    All other components within normal limits  URINALYSIS, ROUTINE W REFLEX MICROSCOPIC - Abnormal; Notable for the following components:   Color, Urine AMBER (*)    APPearance CLOUDY (*)    Glucose, UA >=500 (*)    Ketones, ur 20 (*)    Protein, ur 100 (*)    Leukocytes,Ua LARGE (*)    Bacteria, UA RARE (*)    All other components within normal limits  LACTIC ACID, PLASMA - Abnormal; Notable for the following components:   Lactic Acid, Venous 3.1 (*)    All other components within normal limits  PROTIME-INR - Abnormal; Notable for the following components:   Prothrombin Time 15.7 (*)    INR 1.3 (*)    All other components within normal limits  CBG MONITORING, ED - Abnormal; Notable for the following components:   Glucose-Capillary 246 (*)    All other components within normal limits  URINE CULTURE  RESP PANEL BY RT-PCR (FLU A&B, COVID) ARPGX2  CULTURE, BLOOD (ROUTINE X 2)  CULTURE, BLOOD (ROUTINE X 2)  LACTIC ACID, PLASMA  BASIC METABOLIC PANEL  CBC    EKG EKG Interpretation  Date/Time:  Sunday November 23 2021 16:17:51 EDT Ventricular Rate:  96 PR Interval:  193 QRS Duration: 100 QT Interval:  364 QTC Calculation: 460 R Axis:   -60 Text Interpretation: Sinus rhythm Inferior infarct, old Confirmed by Noemi Chapel [phone]) on 11/23/2021 5:19:38 PM  Radiology DG Chest Port 1  View  Result Date: 11/23/2021 CLINICAL DATA:  Questionable sepsis EXAM: PORTABLE CHEST 1 VIEW COMPARISON:  Chest x-ray 03/13/2016 FINDINGS: There are minimal patchy opacities in the left lung base. The lungs are otherwise clear. There is no pleural effusion or pneumothorax. The cardiomediastinal silhouette is within normal limits. No acute fractures are seen. IMPRESSION: Minimal left basilar atelectasis/airspace disease. Correlate clinically for infection. Electronically Signed   By: Ronney Asters M.D.   On: 11/23/2021 15:33   CT Renal Stone Study  Result Date: 11/23/2021 CLINICAL DATA:  Left flank pain EXAM: CT ABDOMEN AND PELVIS WITHOUT CONTRAST TECHNIQUE: Multidetector CT imaging of the abdomen and pelvis was performed following the standard protocol without IV contrast. RADIATION DOSE REDUCTION: This exam was performed according to the departmental dose-optimization program which includes automated exposure control, adjustment of the mA and/or kV according to patient size and/or use of iterative reconstruction technique. COMPARISON:  03/19/2009, 10/18/2017 FINDINGS: Lower chest: Bibasilar airspace consolidations with trace left pleural effusion. Coronary artery atherosclerosis. Hepatobiliary: No focal liver abnormality is seen. Status post cholecystectomy. No biliary dilatation. Pancreas: Unremarkable. No pancreatic ductal dilatation or surrounding inflammatory changes. Spleen: Normal in size without focal abnormality. Adrenals/Urinary Tract: 1.1 cm right adrenal gland nodule with internal density of 22 HU, stable from chest CT from 2019 and benign. No follow-up imaging recommended. Multiple nonobstructing stones within the right kidney, largest measuring up to 7 mm. No left-sided renal calculi. Bilateral ureters are unremarkable. No hydronephrosis. Urinary bladder within normal limits. Stomach/Bowel: Stomach is within normal limits. No evidence of bowel wall thickening, distention, or inflammatory  changes. Vascular/Lymphatic: Scattered aortoiliac atherosclerotic calcifications without aneurysm. No abdominopelvic lymphadenopathy. Reproductive: No mass or other abnormality. Other: No free fluid. No abdominopelvic fluid collection. No pneumoperitoneum. Fat containing umbilical hernia. Musculoskeletal: No acute bony findings. Prior L4-5 fusion and right total hip arthroplasty. Findings of diffuse idiopathic skeletal hyperostosis. IMPRESSION: 1. Multiple nonobstructing right renal stones. No hydronephrosis. No explanation for patient's left flank pain. 2. Bibasilar airspace consolidations with trace left pleural effusion, suspicious for multifocal pneumonia or aspiration. 3. Fat containing umbilical hernia. 4. Aortic and coronary artery atherosclerosis (ICD10-I70.0). Electronically Signed   By: Davina Poke D.O.   On: 11/23/2021 13:50    Procedures .Critical Care  Performed by: Noemi Chapel, MD Authorized by: Noemi Chapel, MD   Critical care provider statement:    Critical care time (minutes):  30   Critical care time was exclusive of:  Separately billable procedures and treating other patients and teaching time   Critical care was necessary to treat or prevent imminent or life-threatening deterioration of the following conditions:  Sepsis   Critical care was time spent personally by me on the following activities:  Development of treatment plan with patient or surrogate, discussions with consultants, evaluation of patient's response to treatment, examination of patient, ordering and review of laboratory studies, ordering and review of radiographic studies, ordering and performing treatments and interventions, pulse oximetry, re-evaluation of patient's condition, review of old charts and obtaining history from patient or surrogate   I assumed direction of critical care for this patient from another provider in my specialty: no     Care discussed with: admitting  provider   Comments:            Medications Ordered in ED Medications  cefTRIAXone (ROCEPHIN) 2 g in sodium chloride 0.9 % 100 mL IVPB (0 g Intravenous Stopped 11/23/21 1652)  azithromycin (ZITHROMAX) 500 mg in sodium chloride 0.9 % 250 mL IVPB (500 mg Intravenous New Bag/Given 11/23/21 1653)  allopurinol (ZYLOPRIM) tablet 150 mg (has no administration in time range)  aspirin EC tablet 81 mg (has no administration in time range)  amLODipine (NORVASC) tablet 10 mg (has no administration in time range)  atorvastatin (LIPITOR) tablet 10 mg (has no administration in time range)  gemfibrozil (LOPID) tablet 600 mg (has no administration in time range)  metoprolol succinate (TOPROL-XL) 24 hr tablet 25 mg (has no administration in time range)  tadalafil (CIALIS) tablet 2.5 mg (has no administration in time range)  insulin glargine-yfgn (SEMGLEE) injection 50 Units (has no administration in time range)  dorzolamide-timolol (COSOPT) 22.3-6.8 MG/ML ophthalmic solution 1 drop (has no administration in time range)  latanoprost (XALATAN) 0.005 % ophthalmic solution 1 drop (has no administration in time range)  enoxaparin (LOVENOX) injection 55 mg (has no administration in time range)  0.9 %  sodium chloride infusion (has no administration in time range)  acetaminophen (TYLENOL) tablet 650 mg (has no administration in time range)    Or  acetaminophen (TYLENOL) suppository 650 mg (has no administration in time range)  docusate sodium (COLACE) capsule 100 mg (has no administration in time range)  polyethylene glycol (MIRALAX / GLYCOLAX) packet 17 g (has no administration in time range)  bisacodyl (DULCOLAX) EC tablet 5 mg (has no administration in time range)  ondansetron (ZOFRAN) tablet 4 mg (has no administration in time range)    Or  ondansetron (ZOFRAN) injection 4 mg (has no administration in time range)  albuterol (PROVENTIL) (2.5 MG/3ML) 0.083% nebulizer solution 2.5 mg (has no administration in time range)  guaiFENesin  (MUCINEX) 12 hr tablet 600 mg (has no administration in time range)  hydrALAZINE (APRESOLINE) injection 5 mg (has no administration in time range)  sodium chloride flush (NS) 0.9 % injection 3 mL (has no administration in time range)  nystatin (MYCOSTATIN/NYSTOP) topical powder (has no administration in time range)    ED Course/ Medical Decision Making/ A&P                           Medical Decision Making Amount and/or Complexity of Data Reviewed Labs: ordered. Radiology: ordered. ECG/medicine tests: ordered.  Risk Prescription drug management.   This patient presents to the ED for concern of generalized weakness with increasing coughing, mild tachycardia, mild tachypnea, this involves an extensive number of treatment options, and is a complaint that carries with it a high risk of complications and morbidity.  The differential diagnosis includes pneumonia, urine infection, sepsis, less likely to be stroke given generalized weakness, consider electrolyte abnormalities or worsening renal dysfunction   Co morbidities that complicate the patient evaluation  Diabetes, renal patient seen, hypertension   Additional history obtained:  Additional history obtained from electronic medical record External records from outside source obtained and reviewed including prior office notes - has DM - follows with  nutrionist and FP doc - Doctor Damita Dunnings.   Lab Tests:  I Ordered, and personally interpreted labs.  The pertinent results include:  CBC with leukocysotis, BMP without worsening renal dysfunction, UA with bacteria and lots of WBC   Imaging Studies ordered:  I ordered imaging studies  including CT abd / pelvis  I independently visualized and interpreted imaging which showed multiple non obstructing stones - no signs of causes of flank pain / there is evidence of multifocal pneumonia. I agree with the radiologist interpretation   Cardiac Monitoring: / EKG:  The patient was maintained  on a cardiac monitor.  I personally viewed and interpreted the cardiac monitored which showed an underlying rhythm of: borderling tachycardia from 95 - 105   Consultations Obtained:  I requested consultation with the Hospitalist,  and discussed lab and imaging findings as well as pertinent plan - they recommend: Admission   Problem List / ED Course / Critical interventions / Medication management  Pneumonnia - weakness, signs of sepsis with pna, uti and leukocytosis / tachycardia with source. I ordered medication including fouids, antibioitcs.  for infection  Reevaluation of the patient after these medicines showed that the patient improved. I have reviewed the patients home medicines and have made adjustments as needed   Social Determinants of Health:  Weakness Lives with elderly spouse Diabetic Walks with walker.   Test / Admission - Considered:  Admitted         Final Clinical Impression(s) / ED Diagnoses Final diagnoses:  Sepsis, due to unspecified organism, unspecified whether acute organ dysfunction present [facility])  Community acquired pneumonia, unspecified laterality  Urinary tract infection without hematuria, site unspecified  Generalized weakness     Noemi Chapel, MD 11/23/21 1725

## 2021-11-23 NOTE — ED Notes (Signed)
Critical lactic acid 3.1.  MD Yates aware.  No new orders at this time.

## 2021-11-23 NOTE — ED Triage Notes (Signed)
Per ems, pt from home. Pt's wife called reporting pt's urine smelled foul with left flank pain. Also reports increased in falls. Pt denies pain or other urinary sx. EMS VS 94% RAM=, HR 98, BP 180/100. CBG 240, pt took insulin before leaving and has personal monitor on arm. Pt is A&Ox4.

## 2021-11-23 NOTE — Sepsis Progress Note (Signed)
Elink following code sepsis °

## 2021-11-23 NOTE — H&P (Signed)
History and Physical    Patient: [name] PRX:[id number] DOB: 1940/06/08 DOA: 11/23/2021 DOS: the patient was seen and examined on 11/23/2021 PCP: Tonia Ghent, MD  Patient coming from: Home - lives with wife; NOK: Wife, [phone]   Chief Complaint: Weakness  HPI: [name] is a 80 y.o. male with medical history significant of DM; HTN; HLD; and RLS presenting with generalized weakness.  He has been falling - fell Friday night and this AM.  He has been very weak, not light-headed or confused.  Thursday, all was well.  He got up to go to the bathroom Friday and he fell.  He said he had been laying there about 30 minutes.  He also fell about 3 weeks ago, said he missed the chair.  This AM< he slid out of his lift chair and was sitting on the floor in urine and feces.  His wife spoke to a nurse at the PCP and since his head felt clammy, she recommended that they call 911. He has not actually passed out, just feels too weak to stand.  No fever.  His wife heard rattling in his chest yesterday.  No SOB.  His wife reports a very foul urinary odor (Fire Dept also suggested this might suggest a UTI).  No real urinary symptoms.  He reports that he is eating and drinking well.  No sick contacts, does not have any known COVID contacts.  He is very sedentary, uses lift chair but mostly sits all day and night.    ER Course:  Appears to have PNA and UTI.  3 falls in 3 weeks.  Also incontinence.  +cough, SOB for a couple of days, weakness worse.  CT with multifocal PNA, UA also abnormal.  CBC elevated.  No hypoxia, shock.  Given antibiotics, treated for early sepsis.       Review of Systems: As mentioned in the history of present illness. All other systems reviewed and are negative. Past Medical History:  Diagnosis Date   Arthritis    arthirtis all over   Bell's palsy    Cataract 2020   bilateral eyes -pending surgery June 2020   Concussion    multiple- college football player    Diabetes mellitus type 2 in obese ([place])    Frequent urination at night    GERD (gastroesophageal reflux disease)    occ indigestion   Glaucoma    both eyes   Hyperlipemia    Hypertension    Kidney stones    PONV (postoperative nausea and vomiting)    Primary localized osteoarthritis of right hip    Primary osteoarthritis of right knee    Restless leg syndrome    Right knee DJD    Spinal stenosis of lumbar region with radiculopathy    s/p L4/L5 transforaminal blocks   Past Surgical History:  Procedure Laterality Date   ABDOMINAL HERNIA REPAIR     ANTERIOR HIP REVISION Right 01/08/2015   Procedure: RIGHT ANTERIOR HIP REVISION;  Surgeon: Renette Butters, MD;  Location: [facility];  Service: Orthopedics;  Laterality: Right;   BACK SURGERY     CANTHOPLASTY Right 02/09/2017   Procedure: RIGHT LATERAL CANTHOPLASTY;  Surgeon: Irene Limbo, MD;  Location: [facility];  Service: Plastics;  Laterality: Right;   CANTHOPLASTY Left 06/29/2017   Procedure: LEFT LOWER CANTHOPLASTY;  Surgeon: Irene Limbo, MD;  Location: [facility];  Service: Plastics;  Laterality: Left;   CATARACT EXTRACTION Left  COLONOSCOPY     ELBOW ARTHROSCOPY  1990   right   ELBOW SURGERY     right   HERNIA REPAIR  1980   inguinal   HIP CLOSED REDUCTION Right 11/09/2014   Procedure: CLOSED REDUCTION HIP, s/p total hip 8/9;  Surgeon: Ninetta Lights, MD;  Location: [facility];  Service: Orthopedics;  Laterality: Right;   JOINT REPLACEMENT  2008   knee   LITHOTRIPSY     LUMBAR DISC SURGERY     2015   RECONSTRUCTION OF EYELID     REVISION TOTAL HIP ARTHROPLASTY Right 01/08/2015   SHOULDER ARTHROSCOPY  1980   right   SHOULDER SURGERY     right   STONE EXTRACTION WITH BASKET  2010   TOTAL HIP ARTHROPLASTY Right 11/06/2014   Procedure: RIGHT TOTAL HIP ARTHROPLASTY ANTERIOR APPROACH;  Surgeon: Renette Butters, MD;  Location: [facility];  Service: Orthopedics;  Laterality: Right;   TOTAL KNEE  ARTHROPLASTY Left 07/2006   left knee   TOTAL KNEE ARTHROPLASTY Right 01/08/2014   Procedure: RIGHT TOTAL KNEE ARTHROPLASTY;  Surgeon: Lorn Junes, MD;  Location: [facility];  Service: Orthopedics;  Laterality: Right;   Social History:  reports that he has never smoked. He has never used smokeless tobacco. He reports that he does not currently use alcohol. He reports that he does not use drugs.  Allergies  Allergen Reactions   Morphine And Related Nausea And Vomiting    Causes nausea & vomiting   Codeine Sulfate Other (See Comments)    intolerant   Metformin And Related Other (See Comments)    Gi intolerance, not an allergy    Family History  Problem Relation Age of Onset   Hypertension Mother    Kidney disease Mother    Hypertension Father    Diabetes Mellitus II Father    Colon polyps Father    Diabetes Mellitus II Brother    Diabetes Mellitus II Brother    Prostate cancer Brother    Colon cancer Neg Hx     Prior to Admission medications   Medication Sig Start Date End Date Taking? Authorizing Provider  allopurinol (ZYLOPRIM) 300 MG tablet TAKE 1/2 TABLET BY MOUTH EVERY MORNING 05/06/21   Tonia Ghent, MD  amLODipine (NORVASC) 10 MG tablet TAKE 1 TABLET BY MOUTH DAILY 04/30/21   Tonia Ghent, MD  aspirin 81 MG tablet Take 81 mg by mouth daily.    [provider]  atorvastatin (LIPITOR) 10 MG tablet TAKE 1 TABLET BY MOUTH EACH EVENING 11/25/20   Tonia Ghent, MD  blood glucose meter kit and supplies Dispense based on patient and insurance preference. Use up to four times daily as directed. (FOR ICD-10 E10.9, E11.9). 03/14/21   Tonia Ghent, MD  Continuous Blood Gluc Receiver (FREESTYLE LIBRE 2 READER) DEVI USE TO CHECK BLOOD SUGAR DAILY 10/29/20   Tonia Ghent, MD  Continuous Blood Gluc Sensor (FREESTYLE LIBRE 2 SENSOR) MISC USE AS DIRECTED BY PROVIDER 03/14/21   Tonia Ghent, MD  dapagliflozin propanediol (FARXIGA) 10 MG TABS tablet Take 1 tablet (10  mg total) by mouth daily before breakfast. 04/11/21   Shamleffer, Melanie Crazier, MD  diclofenac sodium (VOLTAREN) 1 % GEL Uses PRN 09/20/18   [provider]  dorzolamide-timolol (COSOPT) 22.3-6.8 MG/ML ophthalmic solution Place 1 drop into the left eye 2 (two) times daily.  01/06/18   [provider]  Dulaglutide (TRULICITY) 4.5 TT/0.1XB SOPN Inject 4.5 mg as directed  once a week. 04/11/21   Shamleffer, Melanie Crazier, MD  gemfibrozil (LOPID) 600 MG tablet TAKE 1 TABLET BY MOUTH EVERY MORNING AND1 IN THE EVENING 06/24/21   Tonia Ghent, MD  glucose blood test strip Use as instructed to check blood glucose twice daily 03/14/21   Tonia Ghent, MD  insulin aspart (NOVOLOG) 100 UNIT/ML FlexPen 10 units with meals 11/11/21   Tonia Ghent, MD  insulin glargine, 2 Unit Dial, (TOUJEO MAX SOLOSTAR) 300 UNIT/ML Solostar Pen Inject 220 Units into the skin daily in the afternoon. 05/23/21   Shamleffer, Melanie Crazier, MD  Insulin Pen Needle 31G X 5 MM MISC 1 Device by Does not apply route in the morning, at noon, in the evening, and at bedtime. 04/11/21   Shamleffer, Melanie Crazier, MD  latanoprost (XALATAN) 0.005 % ophthalmic solution Place 1 drop into both eyes at bedtime.    [provider]  losartan (COZAAR) 100 MG tablet TAKE 1 TABLET BY MOUTH DAILY 04/01/21   Tonia Ghent, MD  meloxicam (MOBIC) 15 MG tablet TAKE 1/2 TO 1 TABLET BY MOUTH DAILY AS NEEDED FOR PAIN...TAKE WITH FOOD 03/21/21   Tonia Ghent, MD  methylcellulose (CITRUCEL) oral powder Take 1 packet by mouth daily. 02/16/20   Armbruster, Carlota Raspberry, MD  metoprolol succinate (TOPROL-XL) 25 MG 24 hr tablet TAKE 1 TABLET BY MOUTH DAILY 07/14/21   Tonia Ghent, MD  miconazole (MICATIN) 2 % cream Apply 1 application. topically 2 (two) times daily. 07/15/21   Eugenia Pancoast, FNP  Multiple Vitamins-Minerals (VITRUM 50+ SENIOR MULTI PO) Take 1 tablet by mouth daily.     [provider]  mupirocin  ointment (BACTROBAN) 2 % Apply 1 Application topically 2 (two) times daily. 11/11/21   Tonia Ghent, MD  Tadalafil 2.5 MG TABS Take 1 tablet (2.5 mg total) by mouth daily. 10/09/21   Tonia Ghent, MD    Physical Exam: Vitals:   11/23/21 1149 11/23/21 1201 11/23/21 1445 11/23/21 1446  BP: (!) 144/86  (!) 148/84   Pulse: 99  97   Resp: 18  (!) 24   Temp: 99.8 F (37.7 C)  98.5 F (36.9 C)   TempSrc: Oral  Oral   SpO2: 94%  94% 94%  Weight:  114.3 kg    Height:  6' (1.829 m)     General:  Appears calm and comfortable and is in NAD Eyes:  PERRL, EOMI, normal lids, iris ENT:  grossly normal hearing, lips & tongue, mmm Neck:  no LAD, masses or thyromegaly Cardiovascular:  RRR, no m/r/g. No LE edema.  Respiratory:   CTA bilaterally with no wheezes/rales/rhonchi.  Normal respiratory effort. Abdomen:  soft, NT, ND Skin:  significant skin breakdown in his groin with stool (mostly old) coating his groin and rectum.  There is a sacral pressure ulcer, stage 2-3 noted.    Musculoskeletal:  decreased strength BUE/BLE, no bony abnormality Lower extremity:  1+ LE edema.  Limited foot exam with no ulcerations.  2+ distal pulses. Psychiatric:  blunted mood and affect, speech fluent and appropriate, AOx3 Neurologic:  CN 2-12 grossly intact, moves all extremities in coordinated fashion   Radiological Exams on Admission: Independently reviewed - see discussion in A/P where applicable  DG Chest Port 1 View  Result Date: 11/23/2021 CLINICAL DATA:  Questionable sepsis EXAM: PORTABLE CHEST 1 VIEW COMPARISON:  Chest x-ray 03/13/2016 FINDINGS: There are minimal patchy opacities in the left lung base. The lungs are otherwise clear. There  is no pleural effusion or pneumothorax. The cardiomediastinal silhouette is within normal limits. No acute fractures are seen. IMPRESSION: Minimal left basilar atelectasis/airspace disease. Correlate clinically for infection. Electronically Signed   By: Ronney Asters M.D.   On: 11/23/2021 15:33   CT Renal Stone Study  Result Date: 11/23/2021 CLINICAL DATA:  Left flank pain EXAM: CT ABDOMEN AND PELVIS WITHOUT CONTRAST TECHNIQUE: Multidetector CT imaging of the abdomen and pelvis was performed following the standard protocol without IV contrast. RADIATION DOSE REDUCTION: This exam was performed according to the departmental dose-optimization program which includes automated exposure control, adjustment of the mA and/or kV according to patient size and/or use of iterative reconstruction technique. COMPARISON:  03/19/2009, 10/18/2017 FINDINGS: Lower chest: Bibasilar airspace consolidations with trace left pleural effusion. Coronary artery atherosclerosis. Hepatobiliary: No focal liver abnormality is seen. Status post cholecystectomy. No biliary dilatation. Pancreas: Unremarkable. No pancreatic ductal dilatation or surrounding inflammatory changes. Spleen: Normal in size without focal abnormality. Adrenals/Urinary Tract: 1.1 cm right adrenal gland nodule with internal density of 22 HU, stable from chest CT from 2019 and benign. No follow-up imaging recommended. Multiple nonobstructing stones within the right kidney, largest measuring up to 7 mm. No left-sided renal calculi. Bilateral ureters are unremarkable. No hydronephrosis. Urinary bladder within normal limits. Stomach/Bowel: Stomach is within normal limits. No evidence of bowel wall thickening, distention, or inflammatory changes. Vascular/Lymphatic: Scattered aortoiliac atherosclerotic calcifications without aneurysm. No abdominopelvic lymphadenopathy. Reproductive: No mass or other abnormality. Other: No free fluid. No abdominopelvic fluid collection. No pneumoperitoneum. Fat containing umbilical hernia. Musculoskeletal: No acute bony findings. Prior L4-5 fusion and right total hip arthroplasty. Findings of diffuse idiopathic skeletal hyperostosis. IMPRESSION: 1. Multiple nonobstructing right renal stones. No  hydronephrosis. No explanation for patient's left flank pain. 2. Bibasilar airspace consolidations with trace left pleural effusion, suspicious for multifocal pneumonia or aspiration. 3. Fat containing umbilical hernia. 4. Aortic and coronary artery atherosclerosis (ICD10-I70.0). Electronically Signed   By: Davina Poke D.O.   On: 11/23/2021 13:50    EKG: Independently reviewed.  NSR with rate 96; no evidence of acute ischemia   Labs on Admission: I have personally reviewed the available labs and imaging studies at the time of the admission.  Pertinent labs:    Glucose 236 BUN 20/Creatinine 1.47/GFR 48 - stable Albumin 2.8 WBC 16.6 A1c 7.7 UA: >500 glucose, 20 ketones, large LE, 100 protein, rare bacteria   Assessment and Plan: Principal Problem:   Generalized weakness Active Problems:   HTN (hypertension)   Skin irritation   Pressure ulcer, buttock   Benign prostatic hyperplasia with urinary obstruction   Dyslipidemia   Type 2 diabetes mellitus with stage 3a chronic kidney disease, with long-term current use of insulin (HCC)   Glaucoma    Generalized weakness -Patient presenting with weakness, recurrent falls -There was concern regarding infection - UTI and/or PNA based on labs/imaging -He does not have sepsis physiology, sepsis is ruled out -It is possible that he has an infection contributing to his weakness, although this is not clinically apparent -For now, will continue Rocephin/Azithromycin for CAP based on CT results, although clinically he does not have PNA at this time (he had a little "rattling" in his chest yesterday and no other symptoms); COVID testing was also done -Rocephin should also cover a UTI if present, although he really has no symptoms of UTI (the strong odor that 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 and fire department were concerned about is likely related to hygiene in the setting of FTT) -Most  likely this is deconditioning in the setting of a very sedentary patient  who uses a lift chair -He appears to be quite difficult to effectively be cared for at home and is likely to benefit from SNF rehab (?[facility]) -Will observe overnight on telemetry with PT/OT/nutrition evaluations  DM -A1c is 7.7, indicating suboptimal control - but this is better than prior -He is on a very high dose of Toujeo once daily; will change to Sem-glee, change to 50 units BID -Hold Farxiga, Truligict -DM coordinator consulted -Cover with moderate-scale SSI  -Will not attempt to use CGM since his wife keeps up with his numbers on her cell phone and he does not personally manage this  Sacral pressure ulcer, groin rash -Stage 2-3 pressure ulcer seen -Also with significant groin rash, possibly fungal and so will add nystatin but mostly he appears to need a barrier cream (he was coated in crusted stool and also had no urinary control during evaluation)  BPH -Continue tadalafil -He has no urinary stream control  HTN -Continue amlodipine, Toprol XL -Hold losartan for now and follow renal function  HLD -Continue atorvastatin, gemfibrozil  Glaucoma -Continue Cosopt, latanoprost  Stage 3a CKD -Appears to be stable at this time -Will follow    Advance Care Planning:   Code Status: Full Code   Consults: PT/OT; TOC team; nutrition  DVT Prophylaxis: Lovenox  Family Communication: Wife was present throughout evaluation  Severity of Illness: The appropriate patient status for this patient is OBSERVATION. Observation status is judged to be reasonable and necessary in order to provide the required intensity of service to ensure the patient's safety. The patient's presenting symptoms, physical exam findings, and initial radiographic and laboratory data in the context of their medical condition is felt to place them at decreased risk for further clinical deterioration. Furthermore, it is anticipated that the patient will be medically stable for discharge from the hospital  within 2 midnights of admission.   Author: Karmen Bongo, MD 11/23/2021 4:21 PM  For on call review [email or web address].

## 2021-11-23 NOTE — ED Provider Triage Note (Signed)
Emergency Medicine Provider Triage Evaluation Note  [name] , a 81 y.o. male  was evaluated in triage.  Pt complains of malodorous urine.  Patient states that his wife called EMS because she was concerned that he had UTI.  He states that his urine has been malodorous for likely a week now.  He states that intermittently his left flank has been hurting.  He denies having any dysuria, decreased urine, abdominal pain, nausea, fevers or appetite change.  Wife also reports he has had increased falls recently.  He is not anticoagulated.  Review of Systems  Positive:  Negative: See above  Physical Exam  BP (!) 144/86 (BP Location: Right Arm)   Pulse 99   Temp 99.8 F (37.7 C) (Oral)   Resp 18   SpO2 94%  Gen:   Awake, no distress   Resp:  Normal effort  MSK:   Moves extremities without difficulty  Other:  Abdomen is rounded, soft and nontender.  Mild CVA tenderness on the left.  Medical Decision Making  Medically screening exam initiated at 11:57 AM.  Appropriate orders placed.  [name] was informed that the remainder of the evaluation will be completed by another provider, this initial triage assessment does not replace that evaluation, and the importance of remaining in the ED until their evaluation is complete.     Mickie Hillier, PA-C 11/23/21 1159

## 2021-11-23 NOTE — Sepsis Progress Note (Addendum)
Code Sepsis monitoring discontinued due to cancellation. Order DC at [facility] by Dr Sabra Heck

## 2021-11-24 ENCOUNTER — Telehealth: Payer: Self-pay

## 2021-11-24 DIAGNOSIS — E669 Obesity, unspecified: Secondary | ICD-10-CM | POA: Diagnosis present

## 2021-11-24 DIAGNOSIS — Z9181 History of falling: Secondary | ICD-10-CM | POA: Diagnosis not present

## 2021-11-24 DIAGNOSIS — J189 Pneumonia, unspecified organism: Secondary | ICD-10-CM | POA: Diagnosis not present

## 2021-11-24 DIAGNOSIS — Z794 Long term (current) use of insulin: Secondary | ICD-10-CM | POA: Diagnosis not present

## 2021-11-24 DIAGNOSIS — Z515 Encounter for palliative care: Secondary | ICD-10-CM | POA: Diagnosis not present

## 2021-11-24 DIAGNOSIS — R531 Weakness: Secondary | ICD-10-CM

## 2021-11-24 DIAGNOSIS — H409 Unspecified glaucoma: Secondary | ICD-10-CM | POA: Diagnosis present

## 2021-11-24 DIAGNOSIS — E11649 Type 2 diabetes mellitus with hypoglycemia without coma: Secondary | ICD-10-CM | POA: Diagnosis not present

## 2021-11-24 DIAGNOSIS — Z789 Other specified health status: Secondary | ICD-10-CM | POA: Diagnosis not present

## 2021-11-24 DIAGNOSIS — Z7189 Other specified counseling: Secondary | ICD-10-CM | POA: Diagnosis not present

## 2021-11-24 DIAGNOSIS — R059 Cough, unspecified: Secondary | ICD-10-CM | POA: Diagnosis not present

## 2021-11-24 DIAGNOSIS — N179 Acute kidney failure, unspecified: Secondary | ICD-10-CM | POA: Diagnosis present

## 2021-11-24 DIAGNOSIS — E1165 Type 2 diabetes mellitus with hyperglycemia: Secondary | ICD-10-CM | POA: Diagnosis present

## 2021-11-24 DIAGNOSIS — Z20822 Contact with and (suspected) exposure to covid-19: Secondary | ICD-10-CM | POA: Diagnosis present

## 2021-11-24 DIAGNOSIS — E860 Dehydration: Secondary | ICD-10-CM | POA: Diagnosis present

## 2021-11-24 DIAGNOSIS — N138 Other obstructive and reflux uropathy: Secondary | ICD-10-CM | POA: Diagnosis present

## 2021-11-24 DIAGNOSIS — R627 Adult failure to thrive: Secondary | ICD-10-CM | POA: Diagnosis present

## 2021-11-24 DIAGNOSIS — R296 Repeated falls: Secondary | ICD-10-CM | POA: Diagnosis present

## 2021-11-24 DIAGNOSIS — R32 Unspecified urinary incontinence: Secondary | ICD-10-CM | POA: Diagnosis present

## 2021-11-24 DIAGNOSIS — N401 Enlarged prostate with lower urinary tract symptoms: Secondary | ICD-10-CM

## 2021-11-24 DIAGNOSIS — N1831 Chronic kidney disease, stage 3a: Secondary | ICD-10-CM | POA: Diagnosis present

## 2021-11-24 DIAGNOSIS — L89152 Pressure ulcer of sacral region, stage 2: Secondary | ICD-10-CM | POA: Diagnosis present

## 2021-11-24 DIAGNOSIS — I129 Hypertensive chronic kidney disease with stage 1 through stage 4 chronic kidney disease, or unspecified chronic kidney disease: Secondary | ICD-10-CM | POA: Diagnosis present

## 2021-11-24 DIAGNOSIS — E1122 Type 2 diabetes mellitus with diabetic chronic kidney disease: Secondary | ICD-10-CM | POA: Diagnosis present

## 2021-11-24 DIAGNOSIS — W19XXXA Unspecified fall, initial encounter: Secondary | ICD-10-CM | POA: Diagnosis present

## 2021-11-24 DIAGNOSIS — Z66 Do not resuscitate: Secondary | ICD-10-CM | POA: Diagnosis not present

## 2021-11-24 DIAGNOSIS — J690 Pneumonitis due to inhalation of food and vomit: Secondary | ICD-10-CM | POA: Diagnosis present

## 2021-11-24 DIAGNOSIS — E872 Acidosis, unspecified: Secondary | ICD-10-CM | POA: Diagnosis present

## 2021-11-24 DIAGNOSIS — G2581 Restless legs syndrome: Secondary | ICD-10-CM | POA: Diagnosis present

## 2021-11-24 DIAGNOSIS — E7800 Pure hypercholesterolemia, unspecified: Secondary | ICD-10-CM | POA: Diagnosis present

## 2021-11-24 LAB — BASIC METABOLIC PANEL
Anion gap: 5 (ref 5–15)
BUN: 24 mg/dL — ABNORMAL HIGH (ref 8–23)
CO2: 25 mmol/L (ref 22–32)
Calcium: 8.3 mg/dL — ABNORMAL LOW (ref 8.9–10.3)
Chloride: 105 mmol/L (ref 98–111)
Creatinine, Ser: 1.56 mg/dL — ABNORMAL HIGH (ref 0.61–1.24)
GFR, Estimated: 45 mL/min — ABNORMAL LOW (ref >60)
Glucose, Bld: 190 mg/dL — ABNORMAL HIGH (ref 70–99)
Potassium: 3.4 mmol/L — ABNORMAL LOW (ref 3.5–5.1)
Sodium: 135 mmol/L (ref 135–145)

## 2021-11-24 LAB — CBC
HCT: 43.6 % (ref 39.0–52.0)
Hemoglobin: 14.9 g/dL (ref 13.0–17.0)
MCH: 30 pg (ref 26.0–34.0)
MCHC: 34.2 g/dL (ref 30.0–36.0)
MCV: 87.7 fL (ref 80.0–100.0)
Platelets: 225 10*3/uL (ref 150–400)
RBC: 4.97 MIL/uL (ref 4.22–5.81)
RDW: 15.6 % — ABNORMAL HIGH (ref 11.5–15.5)
WBC: 12.2 10*3/uL — ABNORMAL HIGH (ref 4.0–10.5)
nRBC: 0 % (ref 0.0–0.2)

## 2021-11-24 LAB — PROCALCITONIN: Procalcitonin: 0.52 ng/mL

## 2021-11-24 LAB — GLUCOSE, CAPILLARY
Glucose-Capillary: 165 mg/dL — ABNORMAL HIGH (ref 70–99)
Glucose-Capillary: 87 mg/dL (ref 70–99)

## 2021-11-24 LAB — CBG MONITORING, ED
Glucose-Capillary: 125 mg/dL — ABNORMAL HIGH (ref 70–99)
Glucose-Capillary: 180 mg/dL — ABNORMAL HIGH (ref 70–99)

## 2021-11-24 LAB — MAGNESIUM: Magnesium: 2 mg/dL (ref 1.7–2.4)

## 2021-11-24 MED ORDER — POTASSIUM CHLORIDE CRYS ER 20 MEQ PO TBCR
40.0000 meq | EXTENDED_RELEASE_TABLET | Freq: Once | ORAL | Status: AC
  Administered 2021-11-24: 40 meq via ORAL
  Filled 2021-11-24: qty 2

## 2021-11-24 MED ORDER — INSULIN ASPART 100 UNIT/ML IJ SOLN
0.0000 [IU] | Freq: Three times a day (TID) | INTRAMUSCULAR | Status: DC
  Administered 2021-11-24 (×2): 2 [IU] via SUBCUTANEOUS
  Administered 2021-11-25: 3 [IU] via SUBCUTANEOUS
  Administered 2021-11-25: 1 [IU] via SUBCUTANEOUS
  Administered 2021-11-26 (×2): 5 [IU] via SUBCUTANEOUS
  Administered 2021-11-26: 2 [IU] via SUBCUTANEOUS
  Administered 2021-11-27 (×2): 3 [IU] via SUBCUTANEOUS
  Administered 2021-11-27: 9 [IU] via SUBCUTANEOUS
  Administered 2021-11-28 (×2): 3 [IU] via SUBCUTANEOUS

## 2021-11-24 NOTE — Consult Note (Signed)
Consultation Note Date: 11/24/2021   Patient Name: [name]  DOB: 12-20-1940  MRN: [id number]  Age / Sex: 81 y.o., male  PCP: Tonia Ghent, MD Referring Physician: Antonieta Pert, MD  Reason for Consultation: Establishing goals of care, "goc, code status"  HPI/Patient Profile: 81 y.o. male  with past medical history of Bells palsy, DM, HTN, HLD, CKD 3a, and RLS presented to ED on 11/23/21 from home with wife's concerns of his foul smelling urine and increased weakness/falls.  Patient admitted on 11/23/2021 with generalized weakness/failure to thrive, sacral pressure ulcer/groin rash, pneumonia versus aspiration.   In ED: incontinent, positive for cough,CT nondistorted shows multiple nonobstructing protractedly stone,bibasilar airspace consolidation with trace left pleural effusion, suspicious for multifocal pneumonia or aspiration.He RBC 20-50, WBC 20-50, LE +, Nitrite neg. lactic acid 3.1.resp panel neg.Urine and blood culture sent. Patient admitted for generalized weakness recurrent falls, work-up not consistent with infection sepsis ruled out.  Patient started on IV fluids, repeat labs lactic acid 1.3, wbc down to 12.2, creat 1.5   Clinical Assessment and Goals of Care: I have reviewed medical records including EPIC notes, labs, and imaging. Received report from primary RN - no acute concerns.  Went to visit patient at bedside -no family/visitors present. Patient was lying in bed awake, alert, oriented, and able to participate in conversation. No signs or non-verbal gestures of pain or discomfort noted. No respiratory distress, increased work of breathing, or secretions noted.  Patient denies pain or shortness of breath.  Met with patient to discuss diagnosis, prognosis, GOC, EOL wishes, disposition, and options.    I introduced Palliative Medicine as specialized medical care for people living with serious  illness. It focuses on providing relief from the symptoms and stress of a serious illness. The goal is to improve quality of life for both the patient and the family.  We discussed a brief life review of the patient as well as functional and nutritional status. He and his wife have been married for 28 years - they have 1 daughter and 1 son.  Prior to hospitalization patient was living in a private residence with his wife.  He was able to ambulate with cane/walker as well as dress and bathe himself.  He did not have home health services.  He endorses eating "pretty good;" however, indicates that sometimes he was only hungry for 1 meal a day.  Albumin noted at 2.8 on 11/23/2021.  Patient first started to notice a decline about 3 weeks ago.  Over the last 6 months he has had 5 falls; 3 falls in the last 3 weeks.  We discussed patient's current illness and what it means in the larger context of patient's on-going co-morbidities.  Patient has a clear understanding of his current acute medical situation.  Natural disease trajectory and expectations at EOL were discussed. I attempted to elicit values and goals of care important to the patient. The difference between aggressive medical intervention and comfort care was considered in light of the patient's goals of care.   Patient worked with PT/OT today and states it "went okay." Discussed that the goal of rehab is improvement/stabalization of functional status,  which can be a difficult goal to meet for patients with advanced illness and multiple medical conditions. Reviewed what is needed for someone to have a positive rehabilitation experience to include adequate nutritional intake as well as willingness/ability to participate.  Patient tells me that he "prefers to go home rather than rehab."  Discussed safety concerns  of returning home prior to rehabilitation.  Patient does express understanding of safety concerns -he is open for discharge to rehab but is clear his  long-term goal is to return home.  Briefly discussed that if his weakness continues to progress he may need to consider either additional home health services versus ALF.  Palliative Care services outpatient were explained and offered - patient is agreeable.  Advance directives, concepts specific to code status, artificial feeding and hydration, and rehospitalization were considered and discussed.  Patient states that he does have a living will and healthcare power of attorney.  Requested copy of HCPOA if he is able to provide.  He tells me that his son/Scott Schipani and his daughter/Elizabeth Slan would work together to make decisions on his behalf.  Patient is clear he would want DNR/DNI status with understanding that he would not receive CPR, defibrillation, ACLS medications, or intubation.   Discussed with patient the importance of continued conversation with family and the medical providers regarding overall plan of care and treatment options, ensuring decisions are within the context of the patient's values and GOCs.    Questions and concerns were addressed. The patient/family was encouraged to call with questions and/or concerns. PMT card was provided.  Unable to locate [facility] documents and medical record.  Primary Decision Maker: PATIENT    SUMMARY OF RECOMMENDATIONS   Continue current medical treatment Now DNR/DNI - durable DNR form completed and placed in shadow chart. Copy was made and will be scanned into Vynca/ACP tab Patient is agreeable for discharge to rehab; long-term goal is to return home [facility] consulted for: outpatient palliative care referral PMT will continue to follow peripherally. If there are any imminent needs please call the service directly   Code Status/Advance Care Planning: DNR  Palliative Prophylaxis:  Aspiration, Bowel Regimen, Delirium Protocol, Frequent Pain Assessment, Oral Care, and Turn Reposition  Additional Recommendations (Limitations, Scope,  Preferences): Full Scope Treatment and No Tracheostomy  Psycho-social/Spiritual:  Desire for further Chaplaincy support:no Created space and opportunity for patient and family to express thoughts and feelings regarding patient's current medical situation.  Emotional support and therapeutic listening provided.  Prognosis:  Unable to determine  Discharge Planning: [place] for rehab with Palliative care service follow-up      Primary Diagnoses: Present on Admission:  (Resolved) Multifocal pneumonia  Skin irritation  Pressure ulcer, buttock  (Resolved) Hyperlipemia  HTN (hypertension)  Dyslipidemia  Benign prostatic hyperplasia with urinary obstruction  Glaucoma  Failure to thrive in adult   I have reviewed the medical record, interviewed the patient and family, and examined the patient. The following aspects are pertinent.  Past Medical History:  Diagnosis Date   Arthritis    arthirtis all over   Bell's palsy    Cataract 2020   bilateral eyes -pending surgery June 2020   Concussion    multiple- college football player   Diabetes mellitus type 2 in obese ([place])    Frequent urination at night    GERD (gastroesophageal reflux disease)    occ indigestion   Glaucoma    both eyes   Hyperlipemia    Hypertension    Kidney stones    PONV (postoperative nausea and vomiting)    Primary localized osteoarthritis of right hip    Primary osteoarthritis of right knee    Restless leg syndrome    Right knee DJD    Spinal stenosis of lumbar region with radiculopathy    s/p L4/L5 transforaminal blocks   Social  History   Socioeconomic History   Marital status: Married    Spouse name: [name]   Number of children: 2   Years of education: Not on file   Highest education level: Not on file  Occupational History   Occupation: retired  Tobacco Use   Smoking status: Never   Smokeless tobacco: Never  Substance and Sexual Activity   Alcohol use: Not Currently     Comment: glass of wine every 3-4 months   Drug use: No   Sexual activity: Never  Other Topics Concern   Not on file  Social History Narrative   Lives with wife   Right handed   Drinks no caffeine   Social Determinants of Health   Financial Resource Strain: Low Risk  (03/26/2021)   Overall Financial Resource Strain (CARDIA)    Difficulty of Paying Living Expenses: Not hard at all  Food Insecurity: No Food Insecurity (03/26/2021)   Hunger Vital Sign    Worried About Running Out of Food in the Last Year: Never true    [place] in the Last Year: Never true  Transportation Needs: No Transportation Needs (03/26/2021)   PRAPARE - Hydrologist (Medical): No    Lack of Transportation (Non-Medical): No  Physical Activity: Inactive (03/26/2021)   Exercise Vital Sign    Days of Exercise per Week: 0 days    Minutes of Exercise per Session: 0 min  Stress: No Stress Concern Present (03/26/2021)   [place]    Feeling of Stress : Not at all  Social Connections: Moderately Isolated (03/26/2021)   Social Connection and Isolation Panel [NHANES]    Frequency of Communication with Friends and Family: Twice a week    Frequency of Social Gatherings with Friends and Family: Twice a week    Attends Religious Services: Never    Marine scientist or Organizations: No    Attends Music therapist: Never    Marital Status: Married   Family History  Problem Relation Age of Onset   Hypertension Mother    Kidney disease Mother    Hypertension Father    Diabetes Mellitus II Father    Colon polyps Father    Diabetes Mellitus II Brother    Diabetes Mellitus II Brother    Prostate cancer Brother    Colon cancer Neg Hx    Scheduled Meds:  allopurinol  150 mg Oral Daily   amLODipine  10 mg Oral Daily   aspirin EC  81 mg Oral Daily   atorvastatin  10 mg Oral QPM   docusate sodium   100 mg Oral BID   dorzolamide-timolol  1 drop Left Eye BID   enoxaparin (LOVENOX) injection  55 mg Subcutaneous Q24H   gemfibrozil  600 mg Oral BID AC   insulin aspart  0-9 Units Subcutaneous TID WC   insulin glargine-yfgn  50 Units Subcutaneous BID   latanoprost  1 drop Both Eyes QHS   metoprolol succinate  25 mg Oral Daily   nystatin   Topical BID   sodium chloride flush  3 mL Intravenous Q12H   tadalafil  2.5 mg Oral Daily   Continuous Infusions:  sodium chloride 75 mL/hr at 11/24/21 0334   azithromycin Stopped (11/23/21 1755)   cefTRIAXone (ROCEPHIN)  IV 2 g (11/24/21 1613)   PRN Meds:.acetaminophen **OR** acetaminophen, albuterol, bisacodyl, guaiFENesin, hydrALAZINE, ondansetron **OR** ondansetron (ZOFRAN) IV, polyethylene glycol Medications Prior to  Admission:  Prior to Admission medications   Medication Sig Start Date End Date Taking? Authorizing Provider  allopurinol (ZYLOPRIM) 300 MG tablet TAKE 1/2 TABLET BY MOUTH EVERY MORNING Patient taking differently: Take 150 mg by mouth daily. 05/06/21  Yes Tonia Ghent, MD  amLODipine (NORVASC) 10 MG tablet TAKE 1 TABLET BY MOUTH DAILY Patient taking differently: Take 10 mg by mouth daily. 04/30/21  Yes Tonia Ghent, MD  aspirin 81 MG tablet Take 81 mg by mouth daily.   Yes [provider]  atorvastatin (LIPITOR) 10 MG tablet TAKE 1 TABLET BY MOUTH EACH EVENING Patient taking differently: Take 10 mg by mouth every evening. 11/25/20  Yes Tonia Ghent, MD  dapagliflozin propanediol (FARXIGA) 10 MG TABS tablet Take 1 tablet (10 mg total) by mouth daily before breakfast. 04/11/21  Yes Shamleffer, Melanie Crazier, MD  diclofenac sodium (VOLTAREN) 1 % GEL Apply 2 g topically daily as needed (For pain). 09/20/18  Yes [provider]  dorzolamide-timolol (COSOPT) 22.3-6.8 MG/ML ophthalmic solution Place 1 drop into the left eye 2 (two) times daily.  01/06/18  Yes [provider]  Dulaglutide (TRULICITY) 4.5  JJ/0.0XF SOPN Inject 4.5 mg as directed once a week. 04/11/21  Yes Shamleffer, Melanie Crazier, MD  gemfibrozil (LOPID) 600 MG tablet TAKE 1 TABLET BY MOUTH EVERY MORNING AND1 IN THE EVENING Patient taking differently: Take 600 mg by mouth 2 (two) times daily before a meal. 06/24/21  Yes Tonia Ghent, MD  insulin aspart (NOVOLOG) 100 UNIT/ML FlexPen 10 units with meals Patient taking differently: Inject 10 Units into the skin 3 (three) times daily with meals. 11/11/21  Yes Tonia Ghent, MD  insulin glargine, 2 Unit Dial, (TOUJEO MAX SOLOSTAR) 300 UNIT/ML Solostar Pen Inject 220 Units into the skin daily in the afternoon. 05/23/21  Yes Shamleffer, Melanie Crazier, MD  latanoprost (XALATAN) 0.005 % ophthalmic solution Place 1 drop into both eyes at bedtime.   Yes [provider]  losartan (COZAAR) 100 MG tablet TAKE 1 TABLET BY MOUTH DAILY Patient taking differently: Take 50 mg by mouth daily. 04/01/21  Yes Tonia Ghent, MD  meloxicam (MOBIC) 15 MG tablet TAKE 1/2 TO 1 TABLET BY MOUTH DAILY AS NEEDED FOR PAIN...TAKE WITH FOOD Patient taking differently: Take 7.5 mg by mouth daily as needed for pain. 03/21/21  Yes Tonia Ghent, MD  metoprolol succinate (TOPROL-XL) 25 MG 24 hr tablet TAKE 1 TABLET BY MOUTH DAILY Patient taking differently: Take 25 mg by mouth daily. 07/14/21  Yes Tonia Ghent, MD  Multiple Vitamins-Minerals (VITRUM 50+ SENIOR MULTI PO) Take 1 tablet by mouth daily.    Yes [provider]  Tadalafil 2.5 MG TABS Take 1 tablet (2.5 mg total) by mouth daily. 10/09/21  Yes Tonia Ghent, MD  blood glucose meter kit and supplies Dispense based on patient and insurance preference. Use up to four times daily as directed. (FOR ICD-10 E10.9, E11.9). 03/14/21   Tonia Ghent, MD  Continuous Blood Gluc Receiver (FREESTYLE LIBRE 2 READER) DEVI USE TO CHECK BLOOD SUGAR DAILY 10/29/20   Tonia Ghent, MD  Continuous Blood Gluc Sensor (FREESTYLE LIBRE 2 SENSOR) MISC  USE AS DIRECTED BY PROVIDER 03/14/21   Tonia Ghent, MD  glucose blood test strip Use as instructed to check blood glucose twice daily 03/14/21   Tonia Ghent, MD  Insulin Pen Needle 31G X 5 MM MISC 1 Device by Does not apply route in the morning, at  noon, in the evening, and at bedtime. 04/11/21   Shamleffer, Melanie Crazier, MD  methylcellulose (CITRUCEL) oral powder Take 1 packet by mouth daily. Patient not taking: Reported on 11/23/2021 02/16/20   Yetta Flock, MD  miconazole (MICATIN) 2 % cream Apply 1 application. topically 2 (two) times daily. Patient not taking: Reported on 11/23/2021 07/15/21   Eugenia Pancoast, FNP  mupirocin ointment (BACTROBAN) 2 % Apply 1 Application topically 2 (two) times daily. Patient not taking: Reported on 11/23/2021 11/11/21   Tonia Ghent, MD   Allergies  Allergen Reactions   Morphine And Related Nausea And Vomiting    Causes nausea & vomiting   Codeine Sulfate Other (See Comments)    intolerant   Metformin And Related Other (See Comments)    Gi intolerance, not an allergy   Review of Systems  Constitutional:  Positive for activity change, appetite change and fatigue.  Respiratory:  Positive for shortness of breath.   Gastrointestinal:  Negative for nausea.  Neurological:  Positive for weakness.  All other systems reviewed and are negative.   Physical Exam Vitals and nursing note reviewed.  Constitutional:      General: He is not in acute distress. Pulmonary:     Effort: No respiratory distress.  Skin:    General: Skin is warm and dry.  Neurological:     Mental Status: He is alert and oriented to person, place, and time.     Motor: Weakness present.  Psychiatric:        Attention and Perception: Attention normal.        Behavior: Behavior is cooperative.        Cognition and Memory: Cognition and memory normal.     Vital Signs: BP (!) 145/75 (BP Location: Right Arm)   Pulse 95   Temp 100.1 F (37.8 C) (Oral)   Resp 17    Ht 6' (1.829 m)   Wt 114.3 kg   SpO2 95%   BMI 34.18 kg/m  Pain Scale: 0-10   Pain Score: 0-No pain   SpO2: SpO2: 95 % O2 Device:SpO2: 95 % O2 Flow Rate: .   IO: Intake/output summary:  Intake/Output Summary (Last 24 hours) at 11/24/2021 1634 Last data filed at 11/24/2021 0159 Gross per 24 hour  Intake 594.38 ml  Output --  Net 594.38 ml    LBM:   Baseline Weight: Weight: 114.3 kg Most recent weight: Weight: 114.3 kg     Palliative Assessment/Data: PPS 40-50%     Time In: 1550 Time Out: 1715 Time Total: 85 minutes  Greater than 50%  of this time was spent counseling and coordinating care related to the above assessment and plan.  Signed by: Lin Landsman, NP   Please contact Palliative Medicine Team phone at [phone] for questions and concerns.  For individual provider: See Amion  *Portions of this note are a verbal dictation therefore any spelling and/or grammatical errors are due to the "[place] [employer]" system interpretation.

## 2021-11-24 NOTE — Inpatient Diabetes Management (Signed)
Inpatient Diabetes Program Recommendations  AACE/ADA: New Consensus Statement on Inpatient Glycemic Control (2015)  Target Ranges:  Prepandial:   less than 140 mg/dL      Peak postprandial:   less than 180 mg/dL (1-2 hours)      Critically ill patients:  140 - 180 mg/dL   Lab Results  Component Value Date   GLUCAP 125 (H) 11/24/2021   HGBA1C 7.7 (A) 10/01/2021    Review of Glycemic Control  Latest Reference Range & Units 11/23/21 12:10 11/23/21 21:59 11/24/21 10:40  Glucose-Capillary 70 - 99 mg/dL 246 (H) 255 (H) 125 (H)   Diabetes history: DM 2 Outpatient Diabetes medications: Farxiga 10 mg Daily, Trulicity 4.5 mg weekly, Novolog 10 units tid, Toujeo 220 units Qpm Current orders for Inpatient glycemic control:  Semglee 50 units bid  A1c 7.7% on 7/5  Inpatient Diabetes Program Recommendations:    - Consider ordering Novolog 0-9 units tid + hs  Thanks,  Tama Headings RN, MSN, BC-ADM Inpatient Diabetes Coordinator Team Pager [phone] (8a-5p)

## 2021-11-24 NOTE — Progress Notes (Signed)
Physical Therapy Evaluation Patient Details Name: [name] MRN: [id number] DOB: 1941-02-01 Today's Date: 11/24/2021  History of Present Illness  21 male presenting to ED on 8/28 with generalized weakness and recurrent falls at home. Suspected infection on admission. PMH including DM; HTN; HLD; and RLS.  Clinical Impression  Pt was seen for mobility on side of bed to sidestep and then to move to get up higher in the bed.  Used HHA for stability to get up to side, to maneuver and to steady balance.  He is limited in tolerance to start with and cannot stand for more than a couple minutes, but has already been assisted in standing to get bed changed and to see OT.  Will recommend him to SNF for recovery of safe and reasonable gait, to return LE strength to PLOF and to increase rapid balance recovery as his protective extension and reaction times are slow.  Follow acutely for PT goals as are indicated in POC.       Recommendations for follow up therapy are one component of a multi-disciplinary discharge planning process, led by the attending physician.  Recommendations may be updated based on patient status, additional functional criteria and insurance authorization.  Follow Up Recommendations Skilled nursing-short term rehab (<3 hours/day) Can patient physically be transported by private vehicle: No    Assistance Recommended at Discharge Frequent or constant Supervision/Assistance  Patient can return home with the following  A lot of help with walking and/or transfers;A lot of help with bathing/dressing/bathroom;Direct supervision/assist for medications management;Assist for transportation;Help with stairs or ramp for entrance;Direct supervision/assist for financial management    Equipment Recommendations None recommended by PT  Recommendations for Other Services       Functional Status Assessment Patient has had a recent decline in their functional status and demonstrates the ability to  make significant improvements in function in a reasonable and predictable amount of time.     Precautions / Restrictions Precautions Precautions: Fall Precaution Comments: monitor sats, HR Restrictions Weight Bearing Restrictions: No      Mobility  Bed Mobility Overal bed mobility: Needs Assistance Bed Mobility: Supine to Sit, Sit to Supine     Supine to sit: Mod assist Sit to supine: Mod assist        Transfers Overall transfer level: Needs assistance Equipment used: 1 person hand held assist Transfers: Sit to/from Stand Sit to Stand: Min assist           General transfer comment: min assist to steady and balance    Ambulation/Gait Ambulation/Gait assistance: Min assist Gait Distance (Feet): 12 Feet Assistive device: 2 person hand held assist Gait Pattern/deviations: Step-to pattern, Decreased stride length Gait velocity: reduced Gait velocity interpretation: <1.31 ft/sec, indicative of household ambulator Pre-gait activities: standing balance control General Gait Details: step to pattern on side of bed with help to balance and direct him  Stairs            Wheelchair Mobility    Modified Rankin (Stroke Patients Only)       Balance Overall balance assessment: Needs assistance Sitting-balance support: Feet supported Sitting balance-Leahy Scale: Fair       Standing balance-Leahy Scale: Poor Standing balance comment: requires two hands to steady                             Pertinent Vitals/Pain Pain Assessment Pain Assessment: Faces Faces Pain Scale: Hurts a little bit Pain Location: generalized Pain  Descriptors / Indicators: Guarding Pain Intervention(s): Limited activity within patient's tolerance, Monitored during session, Premedicated before session, Repositioned    Home Living Family/patient expects to be discharged to:: Private residence Living Arrangements: Spouse/significant other Available Help at Discharge:  Family Type of Home: House Home Access: Stairs to enter;Ramped entrance Entrance Stairs-Rails: Right;Left;Can reach both Entrance Stairs-Number of Steps: 5     Home Equipment: Conservation officer, nature (2 wheels);Cane - single point;Shower seat;Grab bars - tub/shower      Prior Function Prior Level of Function : Needs assist       Physical Assist : Mobility (physical) Mobility (physical): Gait   Mobility Comments: RW for independent gait hallway distances ADLs Comments: per family pt was driving,was incontinent     Hand Dominance   Dominant Hand: Right    Extremity/Trunk Assessment   Upper Extremity Assessment Upper Extremity Assessment: Generalized weakness    Lower Extremity Assessment Lower Extremity Assessment: Generalized weakness    Cervical / Trunk Assessment Cervical / Trunk Assessment: Kyphotic (mild)  Communication   Communication: No difficulties  Cognition Arousal/Alertness: Awake/alert Behavior During Therapy: Flat affect Overall Cognitive Status: Impaired/Different from baseline Area of Impairment: Problem solving, Safety/judgement, Following commands, Attention                   Current Attention Level: Selective   Following Commands: Follows one step commands with increased time Safety/Judgement: Decreased awareness of safety, Decreased awareness of deficits   Problem Solving: Slow processing, Requires verbal cues, Requires tactile cues General Comments: requires repetitive instructions for getting off the bed        General Comments General comments (skin integrity, edema, etc.): BP pre and post gait were WFL, O2 sat post gait was 93%    Exercises     Assessment/Plan    PT Assessment Patient needs continued PT services  PT Problem List Decreased strength;Decreased activity tolerance;Decreased balance;Decreased coordination;Pain       PT Treatment Interventions DME instruction;Gait training;Functional mobility training;Therapeutic  activities;Therapeutic exercise;Balance training;Neuromuscular re-education;Patient/family education;Stair training    PT Goals (Current goals can be found in the Care Plan section)  Acute Rehab PT Goals Patient Stated Goal: none stated PT Goal Formulation: With patient/family Time For Goal Achievement: 12/08/21 Potential to Achieve Goals: Good    Frequency Min 3X/week     Co-evaluation               AM-PAC PT "6 Clicks" Mobility  Outcome Measure Help needed turning from your back to your side while in a flat bed without using bedrails?: A Little Help needed moving from lying on your back to sitting on the side of a flat bed without using bedrails?: A Lot Help needed moving to and from a bed to a chair (including a wheelchair)?: A Lot Help needed standing up from a chair using your arms (e.g., wheelchair or bedside chair)?: A Little Help needed to walk in hospital room?: A Little Help needed climbing 3-5 steps with a railing? : Total 6 Click Score: 14    End of Session Equipment Utilized During Treatment: Gait belt Activity Tolerance: Patient limited by fatigue;Treatment limited secondary to medical complications (Comment) Patient left: in bed;with call bell/phone within reach;with family/visitor present Nurse Communication: Mobility status PT Visit Diagnosis: Unsteadiness on feet (R26.81);Muscle weakness (generalized) (M62.81);Difficulty in walking, not elsewhere classified (R26.2);Pain Pain - Right/Left: Left Pain - part of body: Leg;Knee;Hip    Time: 2706-2376 PT Time Calculation (min) (ACUTE ONLY): 14 min   Charges:   PT  Evaluation $PT Eval Moderate Complexity: 1 Mod         Ramond Dial 11/24/2021, 5:04 PM  Mee Hives, PT PhD Acute Rehab Dept. Number: [place] and [place]

## 2021-11-24 NOTE — Evaluation (Deleted)
Occupational Therapy Evaluation Patient Details Name: [name] MRN: [id number] DOB: [birth year] Today's Date: 11/24/2021   History of Present Illness 68 male presenting to ED on 8/28 with generalized weakness and recurrent falls at home. Suspected infection on admission. PMH including DM; HTN; HLD; and RLS.   Clinical Impression   PTA, pt was living with his wife and performing ADLs and short distance mobility with RW. Pt currently requiring Min A for UB ADLs, Max A for LB ADLs, and Min A for side steps towards [facility] with cane. Pt presenting with decreased balance, strength, and activity tolerance. Pt would benefit from further acute OT to facilitate safe dc. Recommend dc to SNF for further OT to optimize safety, independence with ADLs, and return to PLOF.       Recommendations for follow up therapy are one component of a multi-disciplinary discharge planning process, led by the attending physician.  Recommendations may be updated based on patient status, additional functional criteria and insurance authorization.   Follow Up Recommendations  Skilled nursing-short term rehab (<3 hours/day)    Assistance Recommended at Discharge Frequent or constant Supervision/Assistance  Patient can return home with the following A lot of help with walking and/or transfers;A lot of help with bathing/dressing/bathroom    Functional Status Assessment  Patient has had a recent decline in their functional status and demonstrates the ability to make significant improvements in function in a reasonable and predictable amount of time.  Equipment Recommendations  BSC/3in1    Recommendations for Other Services       Precautions / Restrictions Precautions Precautions: Fall Restrictions Weight Bearing Restrictions: No      Mobility Bed Mobility Overal bed mobility: Needs Assistance Bed Mobility: Supine to Sit, Sit to Supine     Supine to sit: Min assist Sit to supine: Min assist   General bed  mobility comments: Min A to elevate trunk. Min A to bring BLEs over EOB in returning to bed    Transfers Overall transfer level: Needs assistance Equipment used: Straight cane Transfers: Sit to/from Stand Sit to Stand: Min guard, Min assist           General transfer comment: Generally Min Guard A for safety; Min A for gaining balance      Balance Overall balance assessment: Needs assistance Sitting-balance support: No upper extremity supported, Feet supported Sitting balance-Leahy Scale: Fair     Standing balance support: Single extremity supported, During functional activity Standing balance-Leahy Scale: Poor Standing balance comment: reliant on atleast one UE for support                           ADL either performed or assessed with clinical judgement   ADL Overall ADL's : Needs assistance/impaired Eating/Feeding: Set up;Sitting   Grooming: Wash/dry face;Set up;Sitting   Upper Body Bathing: Minimal assistance;Sitting   Lower Body Bathing: Maximal assistance;Sit to/from stand   Upper Body Dressing : Minimal assistance;Sitting   Lower Body Dressing: Maximal assistance;Sit to/from stand   Toilet Transfer: Minimal assistance (simulated in room)           Functional mobility during ADLs: Minimal assistance;Cane (side stpes towards HOB) General ADL Comments: Pt presenting with weakness, fatigue, and decreased processing     Vision Baseline Vision/History: 1 Wears glasses       Perception     Praxis      Pertinent Vitals/Pain Pain Assessment Pain Assessment: Faces Faces Pain Scale: Hurts a little bit Pain  Location: generalized Pain Descriptors / Indicators: Discomfort Pain Intervention(s): Monitored during session, Limited activity within patient's tolerance, Repositioned     Hand Dominance Right   Extremity/Trunk Assessment Upper Extremity Assessment Upper Extremity Assessment: Generalized weakness   Lower Extremity Assessment Lower  Extremity Assessment: Defer to PT evaluation   Cervical / Trunk Assessment Cervical / Trunk Assessment: Kyphotic   Communication Communication Communication: No difficulties   Cognition Arousal/Alertness: Awake/alert Behavior During Therapy: Flat affect Overall Cognitive Status: Impaired/Different from baseline Area of Impairment: Problem solving, Following commands                       Following Commands: Follows one step commands with increased time     Problem Solving: Slow processing, Requires verbal cues General Comments: Pt fatigued and generally flat. Requiring increased time throughout. Following simple commands     General Comments  BP stable. Wife present throughout    Exercises     Shoulder Instructions      Home Living Family/patient expects to be discharged to:: Private residence Living Arrangements: Spouse/significant other Available Help at Discharge: Family Type of Home: House Home Access: Stairs to enter;Ramped entrance Entrance Stairs-Number of Steps: 5 Entrance Stairs-Rails: Right;Left;Can reach both       Bathroom Shower/Tub: Occupational psychologist: Handicapped height     Home Equipment: Conservation officer, nature (2 wheels);Cane - single point;Shower seat;Grab bars - tub/shower (lift chair)          Prior Functioning/Environment Prior Level of Function : Needs assist             Mobility Comments: Using RW - can walk home distances ADLs Comments: ADLs, driving, medicatioin management. Wear depends        OT Problem List: Decreased strength;Decreased range of motion;Decreased activity tolerance;Impaired balance (sitting and/or standing);Decreased knowledge of use of DME or AE;Decreased knowledge of precautions      OT Treatment/Interventions: Self-care/ADL training;Therapeutic exercise;Energy conservation;DME and/or AE instruction;Therapeutic activities;Patient/family education    OT Goals(Current goals can be found in  the care plan section) Acute Rehab OT Goals Patient Stated Goal: Get stronger and go home OT Goal Formulation: With patient/family Time For Goal Achievement: 12/08/21 Potential to Achieve Goals: Good  OT Frequency: Min 2X/week    Co-evaluation              AM-PAC OT "6 Clicks" Daily Activity     Outcome Measure Help from another person eating meals?: A Little Help from another person taking care of personal grooming?: A Little Help from another person toileting, which includes using toliet, bedpan, or urinal?: A Lot Help from another person bathing (including washing, rinsing, drying)?: A Lot Help from another person to put on and taking off regular upper body clothing?: A Little Help from another person to put on and taking off regular lower body clothing?: A Lot 6 Click Score: 15   End of Session Equipment Utilized During Treatment: Gait belt;Other (comment) (straight cane) Nurse Communication: Mobility status  Activity Tolerance: Patient limited by fatigue Patient left: in bed;with call bell/phone within reach;with family/visitor present  OT Visit Diagnosis: Other abnormalities of gait and mobility (R26.89);Unsteadiness on feet (R26.81);Muscle weakness (generalized) (M62.81)                Time: 5916-3846 OT Time Calculation (min): 30 min Charges:  OT General Charges $OT Visit: 1 Visit OT Evaluation $OT Eval Moderate Complexity: 1 Mod OT Treatments $Self Care/Home Management : 8-22 mins  Amarie Viles  Sammie Denner MSOT, OTR/L Acute Rehab Office: [place] 11/24/2021, 1:35 PM

## 2021-11-24 NOTE — ED Notes (Signed)
ED TO INPATIENT HANDOFF REPORT  ED Nurse Name and Phone #: [phone]  S Name/Age/Gender [name] 81 y.o. male Room/Bed: 010C/010C  Code Status   Code Status: Full Code  Home/SNF/Other Home Patient oriented to: self, place, time, and situation Is this baseline? Yes   Triage Complete: Triage complete  Chief Complaint Multifocal pneumonia [J18.9] Failure to thrive in adult [R62.7]  Triage Note Per ems, pt from home. Pt's wife called reporting pt's urine smelled foul with left flank pain. Also reports increased in falls. Pt denies pain or other urinary sx. EMS VS 94% RAM=, HR 98, BP 180/100. CBG 240, pt took insulin before leaving and has personal monitor on arm. Pt is A&Ox4.    Allergies Allergies  Allergen Reactions   Morphine And Related Nausea And Vomiting    Causes nausea & vomiting   Codeine Sulfate Other (See Comments)    intolerant   Metformin And Related Other (See Comments)    Gi intolerance, not an allergy    Level of Care/Admitting Diagnosis ED Disposition     ED Disposition  Admit   Condition  --   Comment  Hospital Area: [facility] [[id number]]  Level of Care: Telemetry Medical [104]  May admit patient to [facility] or [facility] if equivalent level of care is available:: No  Covid Evaluation: Asymptomatic - no recent exposure (last 10 days) testing not required  Diagnosis: Failure to thrive in adult [358490]  Admitting Physician: Antonieta Pert [[id number]]  Attending Physician: Antonieta Pert [[id number]]  Certification:: I certify this patient will need inpatient services for at least 2 midnights  Estimated Length of Stay: 2          B Medical/Surgery History Past Medical History:  Diagnosis Date   Arthritis    arthirtis all over   Bell's palsy    Cataract 2020   bilateral eyes -pending surgery June 2020   Concussion    multiple- college football player   Diabetes mellitus type 2 in obese ([place])    Frequent urination at night     GERD (gastroesophageal reflux disease)    occ indigestion   Glaucoma    both eyes   Hyperlipemia    Hypertension    Kidney stones    PONV (postoperative nausea and vomiting)    Primary localized osteoarthritis of right hip    Primary osteoarthritis of right knee    Restless leg syndrome    Right knee DJD    Spinal stenosis of lumbar region with radiculopathy    s/p L4/L5 transforaminal blocks   Past Surgical History:  Procedure Laterality Date   ABDOMINAL HERNIA REPAIR     ANTERIOR HIP REVISION Right 01/08/2015   Procedure: RIGHT ANTERIOR HIP REVISION;  Surgeon: Renette Butters, MD;  Location: [facility];  Service: Orthopedics;  Laterality: Right;   BACK SURGERY     CANTHOPLASTY Right 02/09/2017   Procedure: RIGHT LATERAL CANTHOPLASTY;  Surgeon: Irene Limbo, MD;  Location: [facility];  Service: Plastics;  Laterality: Right;   CANTHOPLASTY Left 06/29/2017   Procedure: LEFT LOWER CANTHOPLASTY;  Surgeon: Irene Limbo, MD;  Location: [facility];  Service: Plastics;  Laterality: Left;   CATARACT EXTRACTION Left    COLONOSCOPY     ELBOW ARTHROSCOPY  1990   right   ELBOW SURGERY     right   HERNIA REPAIR  1980   inguinal   HIP CLOSED REDUCTION Right 11/09/2014   Procedure: CLOSED REDUCTION  HIP, s/p total hip 8/9;  Surgeon: Ninetta Lights, MD;  Location: [facility];  Service: Orthopedics;  Laterality: Right;   JOINT REPLACEMENT  2008   knee   LITHOTRIPSY     LUMBAR DISC SURGERY     2015   RECONSTRUCTION OF EYELID     REVISION TOTAL HIP ARTHROPLASTY Right 01/08/2015   SHOULDER ARTHROSCOPY  1980   right   SHOULDER SURGERY     right   STONE EXTRACTION WITH BASKET  2010   TOTAL HIP ARTHROPLASTY Right 11/06/2014   Procedure: RIGHT TOTAL HIP ARTHROPLASTY ANTERIOR APPROACH;  Surgeon: Renette Butters, MD;  Location: [facility];  Service: Orthopedics;  Laterality: Right;   TOTAL KNEE ARTHROPLASTY Left 07/2006   left knee   TOTAL KNEE ARTHROPLASTY Right  01/08/2014   Procedure: RIGHT TOTAL KNEE ARTHROPLASTY;  Surgeon: Lorn Junes, MD;  Location: [facility];  Service: Orthopedics;  Laterality: Right;     A IV Location/Drains/Wounds Patient Lines/Drains/Airways Status     Active Line/Drains/Airways     Name Placement date Placement time Site Days   Peripheral IV 11/23/21 20 G Left Antecubital 11/23/21  1533  Antecubital  1   Peripheral IV 11/23/21 20 G 1.88" Anterior;Left Forearm 11/23/21  1707  Forearm  1   Incision 04/11/13 Back Other (Comment) 04/11/13  1745  -- 3149   Incision (Closed) 11/06/14 Leg Right 11/06/14  1542  -- 2575   Incision (Closed) 01/08/15 Hip Right 01/08/15  1436  -- 2512   Incision (Closed) 02/09/17 Eye Right 02/09/17  1116  -- 1749   Incision (Closed) 06/29/17 Eye Left 06/29/17  0801  -- 1609            Intake/Output Last 24 hours  Intake/Output Summary (Last 24 hours) at 11/24/2021 1329 Last data filed at 11/24/2021 0159 Gross per 24 hour  Intake 594.38 ml  Output --  Net 594.38 ml    Labs/Imaging Results for orders placed or performed during the hospital encounter of 11/23/21 (from the past 48 hour(s))  Comprehensive metabolic panel     Status: Abnormal   Collection Time: 11/23/21 12:04 PM  Result Value Ref Range   Sodium 136 135 - 145 mmol/L   Potassium 3.8 3.5 - 5.1 mmol/L   Chloride 103 98 - 111 mmol/L   CO2 21 (L) 22 - 32 mmol/L   Glucose, Bld 236 (H) 70 - 99 mg/dL    Comment: Glucose reference range applies only to samples taken after fasting for at least 8 hours.   BUN 20 8 - 23 mg/dL   Creatinine, Ser 1.47 (H) 0.61 - 1.24 mg/dL   Calcium 8.8 (L) 8.9 - 10.3 mg/dL   Total Protein 7.2 6.5 - 8.1 g/dL   Albumin 2.8 (L) 3.5 - 5.0 g/dL   AST 21 15 - 41 U/L   ALT 28 0 - 44 U/L   Alkaline Phosphatase 64 38 - 126 U/L   Total Bilirubin 2.0 (H) 0.3 - 1.2 mg/dL   GFR, Estimated 48 (L) >60 mL/min    Comment: (NOTE) Calculated using the CKD-EPI Creatinine Equation (2021)    Anion gap 12 5 - 15     Comment: Performed at [facility], [place] [address] [place], [place] [address]  CBC with Differential     Status: Abnormal   Collection Time: 11/23/21 12:04 PM  Result Value Ref Range   WBC 16.6 (H) 4.0 - 10.5 K/uL   RBC 5.81 4.22 - 5.81  MIL/uL   Hemoglobin 17.3 (H) 13.0 - 17.0 g/dL   HCT 51.1 39.0 - 52.0 %   MCV 88.0 80.0 - 100.0 fL   MCH 29.8 26.0 - 34.0 pg   MCHC 33.9 30.0 - 36.0 g/dL   RDW 15.5 11.5 - 15.5 %   Platelets 233 150 - 400 K/uL   nRBC 0.0 0.0 - 0.2 %   Neutrophils Relative % 85 %   Neutro Abs 14.2 (H) 1.7 - 7.7 K/uL   Lymphocytes Relative 6 %   Lymphs Abs 0.9 0.7 - 4.0 K/uL   Monocytes Relative 8 %   Monocytes Absolute 1.3 (H) 0.1 - 1.0 K/uL   Eosinophils Relative 0 %   Eosinophils Absolute 0.0 0.0 - 0.5 K/uL   Basophils Relative 0 %   Basophils Absolute 0.0 0.0 - 0.1 K/uL   Immature Granulocytes 1 %   Abs Immature Granulocytes 0.10 (H) 0.00 - 0.07 K/uL    Comment: Performed at [facility] [address] [place], [place] [address]  CBG monitoring, ED     Status: Abnormal   Collection Time: 11/23/21 12:10 PM  Result Value Ref Range   Glucose-Capillary 246 (H) 70 - 99 mg/dL    Comment: Glucose reference range applies only to samples taken after fasting for at least 8 hours.  Urinalysis, Routine w reflex microscopic Urine, Clean Catch     Status: Abnormal   Collection Time: 11/23/21 12:24 PM  Result Value Ref Range   Color, Urine AMBER (A) YELLOW    Comment: BIOCHEMICALS MAY BE AFFECTED BY COLOR   APPearance CLOUDY (A) CLEAR   Specific Gravity, Urine 1.026 1.005 - 1.030   pH 5.0 5.0 - 8.0   Glucose, UA >=500 (A) NEGATIVE mg/dL   Hgb urine dipstick NEGATIVE NEGATIVE   Bilirubin Urine NEGATIVE NEGATIVE   Ketones, ur 20 (A) NEGATIVE mg/dL   Protein, ur 100 (A) NEGATIVE mg/dL   Nitrite NEGATIVE NEGATIVE   Leukocytes,Ua LARGE (A) NEGATIVE   RBC / HPF 21-50 0 - 5 RBC/hpf   WBC, UA 21-50 0 - 5 WBC/hpf   Bacteria, UA RARE (A) NONE SEEN    Squamous Epithelial / LPF 6-10 0 - 5   Mucus PRESENT     Comment: Performed at [facility], [address], [place], [place] [address]  Resp Panel by RT-PCR (Flu A&B, Covid) Anterior Nasal Swab     Status: None   Collection Time: 11/23/21  3:00 PM   Specimen: Anterior Nasal Swab  Result Value Ref Range   SARS Coronavirus 2 by RT PCR NEGATIVE NEGATIVE    Comment: (NOTE) SARS-CoV-2 target nucleic acids are NOT DETECTED.  The SARS-CoV-2 RNA is generally detectable in upper respiratory specimens during the acute phase of infection. The lowest concentration of SARS-CoV-2 viral copies this assay can detect is 138 copies/mL. A negative result does not preclude SARS-Cov-2 infection and should not be used as the sole basis for treatment or other patient management decisions. A negative result may occur with  improper specimen collection/handling, submission of specimen other than nasopharyngeal swab, presence of viral mutation(s) within the areas targeted by this assay, and inadequate number of viral copies(<138 copies/mL). A negative result must be combined with clinical observations, patient history, and epidemiological information. The expected result is Negative.  Fact Sheet for Patients:  EntrepreneurPulse.com.au  Fact Sheet for Healthcare Providers:  IncredibleEmployment.be  This test is no t yet approved or cleared by the [place] and  has been authorized for detection and/or diagnosis of SARS-CoV-2 by FDA under an Emergency Use Authorization (EUA). This EUA will remain  in effect (meaning this test can be used) for the duration of the COVID-19 declaration under Section 564(b)(1) of the Act, 21 U.S.C.section 360bbb-3(b)(1), unless the authorization is terminated  or revoked sooner.       Influenza A by PCR NEGATIVE NEGATIVE   Influenza B by PCR NEGATIVE NEGATIVE    Comment: (NOTE) The Xpert Xpress SARS-CoV-2/FLU/RSV plus  assay is intended as an aid in the diagnosis of influenza from Nasopharyngeal swab specimens and should not be used as a sole basis for treatment. Nasal washings and aspirates are unacceptable for Xpert Xpress SARS-CoV-2/FLU/RSV testing.  Fact Sheet for Patients: EntrepreneurPulse.com.au  Fact Sheet for Healthcare Providers: IncredibleEmployment.be  This test is not yet approved or cleared by the [place] FDA and has been authorized for detection and/or diagnosis of SARS-CoV-2 by FDA under an Emergency Use Authorization (EUA). This EUA will remain in effect (meaning this test can be used) for the duration of the COVID-19 declaration under Section 564(b)(1) of the Act, 21 U.S.C. section 360bbb-3(b)(1), unless the authorization is terminated or revoked.  Performed at [facility], [place] [address] [place], [place] [address]   Blood Culture (routine x 2)     Status: None (Preliminary result)   Collection Time: 11/23/21  3:00 PM   Specimen: BLOOD  Result Value Ref Range   Specimen Description BLOOD LEFT ANTECUBITAL    Special Requests      BOTTLES DRAWN AEROBIC AND ANAEROBIC Blood Culture results may not be optimal due to an excessive volume of blood received in culture bottles   Culture      NO GROWTH < 24 HOURS Performed at [facility] [address] [place], [place] [address]    Report Status PENDING   Lactic acid, plasma     Status: Abnormal   Collection Time: 11/23/21  3:38 PM  Result Value Ref Range   Lactic Acid, Venous 3.1 (HH) 0.5 - 1.9 mmol/L    Comment: CRITICAL RESULT CALLED TO, READ BACK BY AND VERIFIED WITH RAYMAR GONZALEZ RN 11/23/21 '@1711'$  BY J. WHITE Performed at [facility] [address], [place], [place] [address]   Protime-INR     Status: Abnormal   Collection Time: 11/23/21  3:38 PM  Result Value Ref Range   Prothrombin Time 15.7 (H) 11.4 - 15.2 seconds   INR 1.3 (H) 0.8 - 1.2    Comment:  (NOTE) INR goal varies based on device and disease states. Performed at [facility], [place] [address] [place], [place] [address]   Lactic acid, plasma     Status: None   Collection Time: 11/23/21  9:32 PM  Result Value Ref Range   Lactic Acid, Venous 1.3 0.5 - 1.9 mmol/L    Comment: Performed at [facility] [address] [place], [place] [address]  CBG monitoring, ED     Status: Abnormal   Collection Time: 11/23/21  9:59 PM  Result Value Ref Range   Glucose-Capillary 255 (H) 70 - 99 mg/dL    Comment: Glucose reference range applies only to samples taken after fasting for at least 8 hours.  Basic metabolic panel     Status: Abnormal   Collection Time: 11/24/21  1:14 AM  Result Value Ref Range   Sodium 135 135 - 145 mmol/L   Potassium 3.4 (L) 3.5 -  5.1 mmol/L   Chloride 105 98 - 111 mmol/L   CO2 25 22 - 32 mmol/L   Glucose, Bld 190 (H) 70 - 99 mg/dL    Comment: Glucose reference range applies only to samples taken after fasting for at least 8 hours.   BUN 24 (H) 8 - 23 mg/dL   Creatinine, Ser 1.56 (H) 0.61 - 1.24 mg/dL   Calcium 8.3 (L) 8.9 - 10.3 mg/dL   GFR, Estimated 45 (L) >60 mL/min    Comment: (NOTE) Calculated using the CKD-EPI Creatinine Equation (2021)    Anion gap 5 5 - 15    Comment: Performed at [facility] [address], [place], [place] [address]  CBC     Status: Abnormal   Collection Time: 11/24/21  1:14 AM  Result Value Ref Range   WBC 12.2 (H) 4.0 - 10.5 K/uL   RBC 4.97 4.22 - 5.81 MIL/uL   Hemoglobin 14.9 13.0 - 17.0 g/dL   HCT 43.6 39.0 - 52.0 %   MCV 87.7 80.0 - 100.0 fL   MCH 30.0 26.0 - 34.0 pg   MCHC 34.2 30.0 - 36.0 g/dL   RDW 15.6 (H) 11.5 - 15.5 %   Platelets 225 150 - 400 K/uL   nRBC 0.0 0.0 - 0.2 %    Comment: Performed at [facility], [place] [address] [place], [place] [address]  Magnesium     Status: None   Collection Time: 11/24/21  1:14 AM  Result Value Ref Range   Magnesium 2.0 1.7 - 2.4 mg/dL    Comment:  Performed at [facility] [address] [place], [place] [address]  CBG monitoring, ED     Status: Abnormal   Collection Time: 11/24/21 10:40 AM  Result Value Ref Range   Glucose-Capillary 125 (H) 70 - 99 mg/dL    Comment: Glucose reference range applies only to samples taken after fasting for at least 8 hours.   DG Chest Port 1 View  Result Date: 11/23/2021 CLINICAL DATA:  Questionable sepsis EXAM: PORTABLE CHEST 1 VIEW COMPARISON:  Chest x-ray 03/13/2016 FINDINGS: There are minimal patchy opacities in the left lung base. The lungs are otherwise clear. There is no pleural effusion or pneumothorax. The cardiomediastinal silhouette is within normal limits. No acute fractures are seen. IMPRESSION: Minimal left basilar atelectasis/airspace disease. Correlate clinically for infection. Electronically Signed   By: Ronney Asters M.D.   On: 11/23/2021 15:33   CT Renal Stone Study  Result Date: 11/23/2021 CLINICAL DATA:  Left flank pain EXAM: CT ABDOMEN AND PELVIS WITHOUT CONTRAST TECHNIQUE: Multidetector CT imaging of the abdomen and pelvis was performed following the standard protocol without IV contrast. RADIATION DOSE REDUCTION: This exam was performed according to the departmental dose-optimization program which includes automated exposure control, adjustment of the mA and/or kV according to patient size and/or use of iterative reconstruction technique. COMPARISON:  03/19/2009, 10/18/2017 FINDINGS: Lower chest: Bibasilar airspace consolidations with trace left pleural effusion. Coronary artery atherosclerosis. Hepatobiliary: No focal liver abnormality is seen. Status post cholecystectomy. No biliary dilatation. Pancreas: Unremarkable. No pancreatic ductal dilatation or surrounding inflammatory changes. Spleen: Normal in size without focal abnormality. Adrenals/Urinary Tract: 1.1 cm right adrenal gland nodule with internal density of 22 HU, stable from chest CT from 2019 and benign. No follow-up  imaging recommended. Multiple nonobstructing stones within the right kidney, largest measuring up to 7 mm. No left-sided renal calculi. Bilateral ureters are unremarkable. No hydronephrosis. Urinary bladder within normal  limits. Stomach/Bowel: Stomach is within normal limits. No evidence of bowel wall thickening, distention, or inflammatory changes. Vascular/Lymphatic: Scattered aortoiliac atherosclerotic calcifications without aneurysm. No abdominopelvic lymphadenopathy. Reproductive: No mass or other abnormality. Other: No free fluid. No abdominopelvic fluid collection. No pneumoperitoneum. Fat containing umbilical hernia. Musculoskeletal: No acute bony findings. Prior L4-5 fusion and right total hip arthroplasty. Findings of diffuse idiopathic skeletal hyperostosis. IMPRESSION: 1. Multiple nonobstructing right renal stones. No hydronephrosis. No explanation for patient's left flank pain. 2. Bibasilar airspace consolidations with trace left pleural effusion, suspicious for multifocal pneumonia or aspiration. 3. Fat containing umbilical hernia. 4. Aortic and coronary artery atherosclerosis (ICD10-I70.0). Electronically Signed   By: Davina Poke D.O.   On: 11/23/2021 13:50    Pending Labs Unresulted Labs (From admission, onward)     Start     Ordered   11/25/21 0500  Procalcitonin  Daily at 5am,   R      11/24/21 1247   11/25/21 9735  Basic metabolic panel  Daily at 5am,   R      11/24/21 1247   11/25/21 0500  CBC  Daily at 5am,   R      11/24/21 1247   11/24/21 1248  Procalcitonin - Baseline  Add-on,   AD        11/24/21 1247   11/24/21 1112  Urinalysis, Routine w reflex microscopic Urine, Clean Catch  Once,   R        11/24/21 1111   11/23/21 1500  Blood Culture (routine x 2)  (Septic presentation on arrival (screening labs, nursing and treatment orders for obvious sepsis))  BLOOD CULTURE X 2,   STAT      11/23/21 1500   11/23/21 1157  Urine Culture  Once,   URGENT       Question:   Indication  Answer:  Dysuria   11/23/21 1157            Vitals/Pain Today's Vitals   11/24/21 1000 11/24/21 1015 11/24/21 1030 11/24/21 1055  BP:  137/77 131/76   Pulse: 91 91 89   Resp: (!) 22 (!) 23 (!) 25   Temp:    99.1 F (37.3 C)  TempSrc:    Oral  SpO2: 95% 93% 93%   Weight:      Height:      PainSc:        Isolation Precautions No active isolations  Medications Medications  cefTRIAXone (ROCEPHIN) 2 g in sodium chloride 0.9 % 100 mL IVPB (0 g Intravenous Stopped 11/23/21 1652)  azithromycin (ZITHROMAX) 500 mg in sodium chloride 0.9 % 250 mL IVPB (0 mg Intravenous Stopped 11/23/21 1755)  allopurinol (ZYLOPRIM) tablet 150 mg (150 mg Oral Given 11/24/21 1052)  aspirin EC tablet 81 mg (81 mg Oral Given 11/24/21 1051)  amLODipine (NORVASC) tablet 10 mg (10 mg Oral Given 11/24/21 1051)  atorvastatin (LIPITOR) tablet 10 mg (10 mg Oral Given 11/23/21 1747)  gemfibrozil (LOPID) tablet 600 mg (600 mg Oral Not Given 11/24/21 1057)  metoprolol succinate (TOPROL-XL) 24 hr tablet 25 mg (25 mg Oral Given 11/24/21 1051)  tadalafil (CIALIS) tablet 2.5 mg (2.5 mg Oral Given 11/24/21 1051)  insulin glargine-yfgn (SEMGLEE) injection 50 Units (50 Units Subcutaneous Given 11/24/21 1050)  dorzolamide-timolol (COSOPT) 22.3-6.8 MG/ML ophthalmic solution 1 drop (0 drops Left Eye Hold 11/23/21 2130)  latanoprost (XALATAN) 0.005 % ophthalmic solution 1 drop (0 drops Both Eyes Hold 11/23/21 2130)  enoxaparin (LOVENOX) injection 55 mg (55 mg Subcutaneous Given 11/23/21  2128)  0.9 %  sodium chloride infusion ( Intravenous New Bag/Given 11/24/21 0334)  acetaminophen (TYLENOL) tablet 650 mg (has no administration in time range)    Or  acetaminophen (TYLENOL) suppository 650 mg (has no administration in time range)  docusate sodium (COLACE) capsule 100 mg (100 mg Oral Given 11/24/21 1051)  polyethylene glycol (MIRALAX / GLYCOLAX) packet 17 g (has no administration in time range)  bisacodyl (DULCOLAX) EC tablet  5 mg (has no administration in time range)  ondansetron (ZOFRAN) tablet 4 mg (has no administration in time range)    Or  ondansetron (ZOFRAN) injection 4 mg (has no administration in time range)  albuterol (PROVENTIL) (2.5 MG/3ML) 0.083% nebulizer solution 2.5 mg (has no administration in time range)  guaiFENesin (MUCINEX) 12 hr tablet 600 mg (has no administration in time range)  hydrALAZINE (APRESOLINE) injection 5 mg (has no administration in time range)  sodium chloride flush (NS) 0.9 % injection 3 mL (3 mLs Intravenous Not Given 11/24/21 1103)  nystatin (MYCOSTATIN/NYSTOP) topical powder (0 g Topical Hold 11/23/21 2130)  insulin aspart (novoLOG) injection 0-9 Units (has no administration in time range)  lactated ringers bolus 1,000 mL (0 mLs Intravenous Stopped 11/23/21 1923)  potassium chloride SA (KLOR-CON M) CR tablet 40 mEq (40 mEq Oral Given 11/24/21 0335)    Mobility walks with person assist High fall risk   Focused Assessments    R Recommendations: See Admitting Provider Note  Report given to:   Additional Notes:

## 2021-11-24 NOTE — Evaluation (Signed)
Occupational Therapy Evaluation Patient Details Name: [name] MRN: [id number] DOB: [birth year] Today's Date: 11/24/2021   History of Present Illness 82 male presenting to ED on 8/28 with generalized weakness and recurrent falls at home. Suspected infection on admission. PMH including DM; HTN; HLD; and RLS.   Clinical Impression   PTA, pt was living with his wife and was performing BADLs, light IADLs, and driving; however, wife reports he has had decreased functional performance recently. Pt currently requiring Min A for UB ADLs, Max A for LB ADLs, and Min Guard-Min A for functional tranfers.  Pt presenting with generalized weakness, fatigue, and poor activity tolerance. Pt would benefit from further acute OT to facilitate safe dc. Recommend dc to SNF for further OT to optimize safety, independence with ADLs, and return to PLOF.      Recommendations for follow up therapy are one component of a multi-disciplinary discharge planning process, led by the attending physician.  Recommendations may be updated based on patient status, additional functional criteria and insurance authorization.   Follow Up Recommendations  Skilled nursing-short term rehab (<3 hours/day)    Assistance Recommended at Discharge Frequent or constant Supervision/Assistance  Patient can return home with the following A lot of help with walking and/or transfers;A lot of help with bathing/dressing/bathroom    Functional Status Assessment  Patient has had a recent decline in their functional status and demonstrates the ability to make significant improvements in function in a reasonable and predictable amount of time.  Equipment Recommendations  BSC/3in1    Recommendations for Other Services       Precautions / Restrictions Precautions Precautions: Fall Restrictions Weight Bearing Restrictions: No      Mobility Bed Mobility Overal bed mobility: Needs Assistance Bed Mobility: Supine to Sit, Sit to Supine      Supine to sit: Min assist Sit to supine: Min assist   General bed mobility comments: Min A to elevate trunk. Min A to bring BLEs over EOB in returning to bed    Transfers Overall transfer level: Needs assistance Equipment used: Straight cane Transfers: Sit to/from Stand Sit to Stand: Min guard, Min assist           General transfer comment: Generally Min Guard A for safety; Min A for gaining balance      Balance Overall balance assessment: Needs assistance Sitting-balance support: No upper extremity supported, Feet supported Sitting balance-Leahy Scale: Fair     Standing balance support: Single extremity supported, During functional activity Standing balance-Leahy Scale: Poor Standing balance comment: reliant on atleast one UE for support                           ADL either performed or assessed with clinical judgement   ADL Overall ADL's : Needs assistance/impaired Eating/Feeding: Set up;Sitting   Grooming: Wash/dry face;Set up;Sitting   Upper Body Bathing: Minimal assistance;Sitting   Lower Body Bathing: Maximal assistance;Sit to/from stand   Upper Body Dressing : Minimal assistance;Sitting   Lower Body Dressing: Maximal assistance;Sit to/from stand   Toilet Transfer: Minimal assistance (simulated in room)           Functional mobility during ADLs: Minimal assistance;Cane (side stpes towards HOB) General ADL Comments: Pt presenting with weakness, fatigue, and decreased processing     Vision Baseline Vision/History: 1 Wears glasses       Perception     Praxis      Pertinent Vitals/Pain Pain Assessment Pain Assessment: Faces Faces  Pain Scale: Hurts a little bit Pain Location: generalized Pain Descriptors / Indicators: Discomfort Pain Intervention(s): Monitored during session, Limited activity within patient's tolerance, Repositioned     Hand Dominance Right   Extremity/Trunk Assessment Upper Extremity Assessment Upper  Extremity Assessment: Generalized weakness   Lower Extremity Assessment Lower Extremity Assessment: Defer to PT evaluation   Cervical / Trunk Assessment Cervical / Trunk Assessment: Kyphotic   Communication Communication Communication: No difficulties   Cognition Arousal/Alertness: Awake/alert Behavior During Therapy: Flat affect Overall Cognitive Status: Impaired/Different from baseline Area of Impairment: Problem solving, Following commands                       Following Commands: Follows one step commands with increased time     Problem Solving: Slow processing, Requires verbal cues General Comments: Pt fatigued and generally flat. Requiring increased time throughout. Following simple commands     General Comments  BP stable. Wife present throughout    Exercises     Shoulder Instructions      Home Living Family/patient expects to be discharged to:: Private residence Living Arrangements: Spouse/significant other Available Help at Discharge: Family Type of Home: House Home Access: Stairs to enter;Ramped entrance Entrance Stairs-Number of Steps: 5 Entrance Stairs-Rails: Right;Left;Can reach both       Bathroom Shower/Tub: Occupational psychologist: Handicapped height     Home Equipment: Conservation officer, nature (2 wheels);Cane - single point;Shower seat;Grab bars - tub/shower (lift chair)          Prior Functioning/Environment Prior Level of Function : Needs assist             Mobility Comments: Using RW - can walk home distances ADLs Comments: ADLs, driving, medicatioin management. Wear depends        OT Problem List: Decreased strength;Decreased range of motion;Decreased activity tolerance;Impaired balance (sitting and/or standing);Decreased knowledge of use of DME or AE;Decreased knowledge of precautions      OT Treatment/Interventions: Self-care/ADL training;Therapeutic exercise;Energy conservation;DME and/or AE instruction;Therapeutic  activities;Patient/family education    OT Goals(Current goals can be found in the care plan section) Acute Rehab OT Goals Patient Stated Goal: Get stronger and go home OT Goal Formulation: With patient/family Time For Goal Achievement: 12/08/21 Potential to Achieve Goals: Good  OT Frequency: Min 2X/week    Co-evaluation              AM-PAC OT "6 Clicks" Daily Activity     Outcome Measure Help from another person eating meals?: A Little Help from another person taking care of personal grooming?: A Little Help from another person toileting, which includes using toliet, bedpan, or urinal?: A Lot Help from another person bathing (including washing, rinsing, drying)?: A Lot Help from another person to put on and taking off regular upper body clothing?: A Little Help from another person to put on and taking off regular lower body clothing?: A Lot 6 Click Score: 15   End of Session Equipment Utilized During Treatment: Gait belt;Other (comment) (straight cane) Nurse Communication: Mobility status  Activity Tolerance: Patient limited by fatigue Patient left: in bed;with call bell/phone within reach;with family/visitor present  OT Visit Diagnosis: Other abnormalities of gait and mobility (R26.89);Unsteadiness on feet (R26.81);Muscle weakness (generalized) (M62.81)                Time: 4403-4742 OT Time Calculation (min): 30 min Charges:  OT General Charges $OT Visit: 1 Visit OT Evaluation $OT Eval Moderate Complexity: 1 Mod OT Treatments $Self  Care/Home Management : 8-22 mins  Carsyn Boster MSOT, OTR/L Acute Rehab Office: [place] 11/24/2021, 1:41 PM

## 2021-11-24 NOTE — Evaluation (Signed)
Clinical/Bedside Swallow Evaluation Patient Details  Name: [name] MRN: [id number] Date of Birth: March 10, 1941  Today's Date: 11/24/2021 Time: SLP Start Time (ACUTE ONLY): 38 SLP Stop Time (ACUTE ONLY): 1337 SLP Time Calculation (min) (ACUTE ONLY): 14 min  Past Medical History:  Past Medical History:  Diagnosis Date   Arthritis    arthirtis all over   Bell's palsy    Cataract 2020   bilateral eyes -pending surgery June 2020   Concussion    multiple- college football player   Diabetes mellitus type 2 in obese ([place])    Frequent urination at night    GERD (gastroesophageal reflux disease)    occ indigestion   Glaucoma    both eyes   Hyperlipemia    Hypertension    Kidney stones    PONV (postoperative nausea and vomiting)    Primary localized osteoarthritis of right hip    Primary osteoarthritis of right knee    Restless leg syndrome    Right knee DJD    Spinal stenosis of lumbar region with radiculopathy    s/p L4/L5 transforaminal blocks   Past Surgical History:  Past Surgical History:  Procedure Laterality Date   ABDOMINAL HERNIA REPAIR     ANTERIOR HIP REVISION Right 01/08/2015   Procedure: RIGHT ANTERIOR HIP REVISION;  Surgeon: Renette Butters, MD;  Location: [facility];  Service: Orthopedics;  Laterality: Right;   BACK SURGERY     CANTHOPLASTY Right 02/09/2017   Procedure: RIGHT LATERAL CANTHOPLASTY;  Surgeon: Irene Limbo, MD;  Location: [facility];  Service: Plastics;  Laterality: Right;   CANTHOPLASTY Left 06/29/2017   Procedure: LEFT LOWER CANTHOPLASTY;  Surgeon: Irene Limbo, MD;  Location: [facility];  Service: Plastics;  Laterality: Left;   CATARACT EXTRACTION Left    COLONOSCOPY     ELBOW ARTHROSCOPY  1990   right   ELBOW SURGERY     right   HERNIA REPAIR  1980   inguinal   HIP CLOSED REDUCTION Right 11/09/2014   Procedure: CLOSED REDUCTION HIP, s/p total hip 8/9;  Surgeon: Ninetta Lights, MD;  Location: [place] [facility];   Service: Orthopedics;  Laterality: Right;   JOINT REPLACEMENT  2008   knee   LITHOTRIPSY     LUMBAR DISC SURGERY     2015   RECONSTRUCTION OF EYELID     REVISION TOTAL HIP ARTHROPLASTY Right 01/08/2015   SHOULDER ARTHROSCOPY  1980   right   SHOULDER SURGERY     right   STONE EXTRACTION WITH BASKET  2010   TOTAL HIP ARTHROPLASTY Right 11/06/2014   Procedure: RIGHT TOTAL HIP ARTHROPLASTY ANTERIOR APPROACH;  Surgeon: Renette Butters, MD;  Location: [facility];  Service: Orthopedics;  Laterality: Right;   TOTAL KNEE ARTHROPLASTY Left 07/2006   left knee   TOTAL KNEE ARTHROPLASTY Right 01/08/2014   Procedure: RIGHT TOTAL KNEE ARTHROPLASTY;  Surgeon: Lorn Junes, MD;  Location: [facility];  Service: Orthopedics;  Laterality: Right;   HPI:  Pt is a 81 y.o. male who presented with generalized weakness, has been falling. CXR (11/23/21) revealed "Minimal left basilar atelectasis/airspace disease". Per MD note (8/28) there are concerns for possible PNA. PMH: DM, HTN, HLD and RLS.    Assessment / Plan / Recommendation  Clinical Impression  Pt presents with clinical signs of oropharyngeal dysphagia at bedside. He reports no hx of dysphagia and consuming a regular diet at PLOF. Oral mechanism examination unremarkable. Sips of thin by straw were frequented  by immediate overt coughing, which was not improved with control for volume. Sips by cup fully eliminated s/sx of aspiration. All solids manipulated/masticated and orally cleared without difficulties. Recommend continue regular diet/thin liquids, with liquids to be consumed by cup only. SLP to f/u for tolerance and to determine if instrumental swallow assessment is indicated.  SLP Visit Diagnosis: Dysphagia, unspecified (R13.10)    Aspiration Risk  Mild aspiration risk    Diet Recommendation Regular;Thin liquid   Liquid Administration via: Cup;No straw Medication Administration: Whole meds with liquid Supervision: Patient able to self feed;Intermittent  supervision to cue for compensatory strategies Compensations: Minimize environmental distractions;Slow rate;Small sips/bites Postural Changes: Seated upright at 90 degrees    Other  Recommendations Oral Care Recommendations: Oral care BID    Recommendations for follow up therapy are one component of a multi-disciplinary discharge planning process, led by the attending physician.  Recommendations may be updated based on patient status, additional functional criteria and insurance authorization.  Follow up Recommendations Other (comment) (TBD)      Assistance Recommended at Discharge Intermittent Supervision/Assistance  Functional Status Assessment Patient has had a recent decline in their functional status and demonstrates the ability to make significant improvements in function in a reasonable and predictable amount of time.  Frequency and Duration min 2x/week  2 weeks       Prognosis Prognosis for Safe Diet Advancement: Good Barriers to Reach Goals: Time post onset      Swallow Study   General Date of Onset: 11/23/21 HPI: Pt is a 81 y.o. male who presented with generalized weakness, has been falling. CXR (11/23/21) revealed "Minimal left basilar atelectasis/airspace disease". Per MD note (8/28) there are concerns for possible PNA. PMH: DM, HTN, HLD and RLS. Type of Study: Bedside Swallow Evaluation Previous Swallow Assessment: none per EMR Diet Prior to this Study: Regular;Thin liquids Temperature Spikes Noted: Yes (99.8) Respiratory Status: Room air History of Recent Intubation: No Behavior/Cognition: Alert;Cooperative;Pleasant mood Oral Cavity Assessment: Within Functional Limits Oral Care Completed by SLP: No Oral Cavity - Dentition: Adequate natural dentition Vision: Functional for self-feeding Self-Feeding Abilities: Able to feed self Patient Positioning: Upright in bed Baseline Vocal Quality: Normal Volitional Cough: Strong Volitional Swallow: Able to elicit     Oral/Motor/Sensory Function Overall Oral Motor/Sensory Function: Within functional limits   Ice Chips Ice chips: Not tested   Thin Liquid Thin Liquid: Impaired Presentation: Self Fed;Cup;Straw Pharyngeal  Phase Impairments: Cough - Immediate    Nectar Thick Nectar Thick Liquid: Not tested   Honey Thick Honey Thick Liquid: Not tested   Puree Puree: Within functional limits Presentation: Self Fed;Spoon   Solid     Solid: Within functional limits Presentation: [place], [place], [place] Office Number: [place] 11/24/2021,1:55 PM

## 2021-11-24 NOTE — Hospital Course (Addendum)
81 y.o.m w/ history significant of DM; HTN; HLD; and RLS presented with generalized weakness, has been falling, had a Friday night and 8/27 am, has been very weak, not light-headed or confused. He also fell about 3 weeks ago, said he missed the chair. , He has not had LOC. Wife reports a very foul urinary odor, no real urinary symptoms In EX:HBZJIRCVELF, positive for cough,CT nondistorted shows multiple nonobstructing protractedly stone,bibasilar airspace consolidation with trace left pleural effusion, suspicious for multifocal pneumonia or aspiration.He RBC 20-50, WBC 20-50, LE +, Nitrite neg. lactic acid 3.1.resp panel neg.Urine and blood culture sent. Patient admitted for generalized weakness recurrent falls, work-up not consistent with infection sepsis ruled out.  Patient started on IV fluids, repeat labs lactic acid 1.3, wbc down to 12.2, creat 1.5 Patient seen by PT OT advised skilled nursing facility, changed to DNR by palliative care.  Dehydration AKI resolved WC count normalized

## 2021-11-24 NOTE — Consult Note (Signed)
WOC Nurse Consult Note: Reason for Consult: Pressure injury to buttocks, moisture and pressure, also affecting perineal skin.  Wound type:Moisture and pressure (Deep tissue injury to bilateral buttocks) Pressure Injury POA: Yes Measurement: buttocks left and right gluteal fold affected. Darker, maroon discoloration to center, over ischial tuberosity.   PErineal skin is erythematous with satellite lesions noted, consistent with fungal overgrowth.   Home situation includes poor attention to hygiene in the setting of incontinence Wound bed: Dark maroon discoloration over bilateral ischial tuberosities with peeling epithelium.  Drainage (amount, consistency, odor) scant weeping Periwound:Blacnchable erythema to periwound Dressing procedure/placement/frequency: Cleanse perineal skin with soap and water and pat dry. Apply nystatin as ordered.  CLeanse buttocks wounds with soap and water and pat dry. Apply alginate to wound beds ([facility] # [phone]) and cover gluteal folds with silicone foam.  Change daily and PRN Soilage.   Mattress with low air loss feature.  No disposable briefs or underpads.  Will not follow at this time.  Please re-consult if needed.  Domenic Moras MSN, RN, FNP-BC CWON Wound, Ostomy, Continence Nurse Pager [phone]

## 2021-11-24 NOTE — Telephone Encounter (Signed)
Per chart review tab pt was seen at [facility] ED on 11/23/21 and pt was admitted. Sending note to Dr Damita Dunnings.

## 2021-11-24 NOTE — ED Notes (Signed)
Assumed pt care at this time

## 2021-11-24 NOTE — Telephone Encounter (Signed)
Noted. Thanks.  Will await inpatient notes.   

## 2021-11-24 NOTE — Telephone Encounter (Signed)
[facility] [place] Night - Client TELEPHONE ADVICE RECORD [facility] Patient Name: [name] Gender: Male DOB: [birth year] Age: 81 Y 68 M 24 D Return Phone Number: [phone] (Primary), [phone] (Secondary) Address: City/ State/ Zip: [place]  [address] Client [place] Night - Client Client Site [place] - Night Provider Renford Dills - MD Contact Type Call Who Is Calling Patient / Member / Family / Caregiver Call Type Triage / Clinical Caller Name Ringo Sherod Relationship To Patient Spouse Return Phone Number [phone] (Primary) Chief Complaint Fall - No Injury Reason for Call Symptomatic / Request for [place] says husband fell this morning; she says he has a hard smell. Paramedics say he may have a UTI- no other symptoms. Translation No Nurse Assessment Nurse: Windle Guard, RN, Lesa Date/Time (Eastern Time): 11/23/2021 10:05:06 AM Confirm and document reason for call. If symptomatic, describe symptoms. ---Caller states her husband's urine has a odor. He fell 11/21/21 and again today. He has incontinence. He states he is not hurt from the fall Does the patient have any new or worsening symptoms? ---Yes Will a triage be completed? ---Yes Related visit to physician within the last 2 weeks? ---No Does the PT have any chronic conditions? (i.e. diabetes, asthma, this includes High risk factors for pregnancy, etc.) ---Yes List chronic conditions. ---diabetes, HTN, incontinence, Is this a behavioral health or substance abuse call? ---No Guidelines Guideline Title Affirmed Question Affirmed Notes Nurse Date/Time (Eastern Time) Urinary Symptoms Shock suspected (e.g., cold/pale/ clammy skin, too weak to stand, low BP, rapid pulse) Conner, RN, Lesa 11/23/2021 10:11:30 AM Disp. Time Eilene Ghazi Time) Disposition Final User 11/23/2021 10:15:11 AM Call EMS 911  Now Yes Conner, RN, Lesa PLEASE NOTE: All timestamps contained within this report are represented as [place] Standard Time. CONFIDENTIALTY NOTICE: This fax transmission is intended only for the addressee. It contains information that is legally privileged, confidential or otherwise protected from use or disclosure. If you are not the intended recipient, you are strictly prohibited from reviewing, disclosing, copying using or disseminating any of this information or taking any action in reliance on or regarding this information. If you have received this fax in error, please notify us immediately by telephone so that we can arrange for its return to [place]. Phone: [phone], Toll-Free: [phone], Fax: [phone] Page: 2 of 2 Call Id: [id number] [place]. Time Eilene Ghazi Time) Disposition Final User 11/23/2021 10:23:19 AM 911 Outcome Documentation Conner, RN, Lesa Reason: Unable to reach the caller Final Disposition 11/23/2021 10:15:11 AM Call EMS 62 Now Yes Conner, RN, Emmaline Kluver Caller Disagree/Comply Comply Caller Understands Yes PreDisposition Home Care Care Advice Given Per Guideline CALL EMS 911 NOW: * Immediate medical attention is needed. You need to hang up and call 911 (or an ambulance). FIRST AID - LIE DOWN FOR SHOCK: * Lie down with the feet elevated. CARE ADVICE given per Urinary Symptoms (Adult) guideline. Comments User: Starleen Blue, RN Date/Time Eilene Ghazi Time): 11/23/2021 10:15:54 AM He is wea

## 2021-11-24 NOTE — Progress Notes (Signed)
PROGRESS NOTE [name]  QMG:[id number] DOB: 1940-11-19 DOA: 11/23/2021 PCP: Tonia Ghent, MD   Brief Narrative/Hospital Course: 81 y.o.m w/ history significant of DM; HTN; HLD; and RLS presented with generalized weakness, has been falling, had a Friday night and 8/27 am, has been very weak, not light-headed or confused. He also fell about 3 weeks ago, said he missed the chair. , He has not had LOC. Wife reports a very foul urinary odor, no real urinary symptoms In QB:[id number], positive for cough,CT nondistorted shows multiple nonobstructing protractedly stone,bibasilar airspace consolidation with trace left pleural effusion, suspicious for multifocal pneumonia or aspiration.He RBC 20-50, WBC 20-50, LE +, Nitrite neg. lactic acid 3.1.resp panel neg.Urine and blood culture sent. Patient admitted for generalized weakness recurrent falls, work-up not consistent with infection sepsis ruled out.  Patient started on IV fluids, repeat labs lactic acid 1.3, wbc down to 12.2, creat 1.5    Subjective: Seen and examined this morning, resting comfortably complains of weakness all over Nonfocal. Has incontinence but denies dysuria cough fever chills Noted to have trigeminy in the EKG overnight, potassium was replaced, telemetry shows improvement, patient was asymptomatic.   Assessment and Plan: Principal Problem:   Generalized weakness Active Problems:   HTN (hypertension)   Skin irritation   Pressure ulcer, buttock   Benign prostatic hyperplasia with urinary obstruction   Dyslipidemia   Type 2 diabetes mellitus with stage 3a chronic kidney disease, with long-term current use of insulin (HCC)   Glaucoma  Generalized weakness Debility Recurrent falls Failure to thrive: Suspect multifactorial in the setting of multiple comorbidities, overlays, deconditioning.  We will continue on PT OT, supportive care fall precaution.  Consult dietitian, palliative care.  Leukocytosis Possible  pneumonia: Urinary incontinence rule out UTI: Suspected infection on admission, CT renal stone bibasilar airspace consolidation noted, on empiric antibiotics check urine antigen Legionella antigen.  Patient has incontinence with foul order, UA abnormal urine culture pending.  Check procalcitonin.  Please count downtrending.  Had initial lactic acidosis in the ED and has resolved.  Speech eval requested.  Diabetes mellitus with uncontrolled hyperglycemia A1c 7.7.  He is on high-dose Toujeo at home, changed to [facility] 50 units twice daily, SSI Recent Labs  Lab 11/23/21 1210 11/23/21 2159 11/24/21 1040  GLUCAP 246* 255* 125*    Hypertension Hyperlipidemia: BP stable, continue amlodipine 10 mg, Toprol.  Continue his Lipitor and Lopid.  Mild hypokalemia replacement ordered  CKD 3a:creatinine holding around 1.5 monitor closely. Recent Labs  Lab 11/23/21 1204 11/24/21 0114  BUN 20 24*  CREATININE 1.47* 1.56*     BPH: Continue tadafil.  Having urinary incontinence, no control on urinary stream  Glaucoma continues Cosopt and latanoprost   Deep tissue injury to bilateral buttock POA: Continue wound care wound nurse input appreciated   Class I Obesity:Patient's Body mass index is 34.18 kg/m. : Will benefit with PCP follow-up, weight loss  healthy lifestyle.   DVT prophylaxis:  Code Status:   Code Status: Full Code Family Communication: plan of care discussed with patient/no family at bedside. Called wife no answer.  Patient status is: Admitted as observation, remains hospitalized needing at least 2 midnight because of ongoing management of multiple medical issues Level of care: Telemetry Medical   Dispo: The patient is from: home w/ wife            Anticipated disposition: TBD.  PT OT eval requested  Mobility Assessment (last 72 hours)     Mobility Assessment   No  documentation.            Objective: Vitals last 24 hrs: Vitals:   11/24/21 1000 11/24/21 1015 11/24/21  1030 11/24/21 1055  BP:  137/77 131/76   Pulse: 91 91 89   Resp: (!) 22 (!) 23 (!) 25   Temp:    99.1 F (37.3 C)  TempSrc:    Oral  SpO2: 95% 93% 93%   Weight:      Height:       Weight change:   Physical Examination: General exam: alert awake,older than stated age, weak appearing. HEENT:Oral mucosa moist, Ear/Nose WNL grossly, dentition normal. Respiratory system: bilaterally diminished BS, no use of accessory muscle Cardiovascular system: S1 & S2 +, No JVD. Gastrointestinal system: Abdomen soft,NT,ND, BS+ Nervous System:Alert, awake, gross weakness bilaterally moving extremities and grossly nonfocal Extremities: LE edema neg,,distal peripheral pulses palpable.  Skin: No rashes,no icterus. MSK: Normal muscle bulk,tone, power  Medications reviewed:  Scheduled Meds:  allopurinol  150 mg Oral Daily   amLODipine  10 mg Oral Daily   aspirin EC  81 mg Oral Daily   atorvastatin  10 mg Oral QPM   docusate sodium  100 mg Oral BID   dorzolamide-timolol  1 drop Left Eye BID   enoxaparin (LOVENOX) injection  55 mg Subcutaneous Q24H   gemfibrozil  600 mg Oral BID AC   insulin aspart  0-9 Units Subcutaneous TID WC   insulin glargine-yfgn  50 Units Subcutaneous BID   latanoprost  1 drop Both Eyes QHS   metoprolol succinate  25 mg Oral Daily   nystatin   Topical BID   sodium chloride flush  3 mL Intravenous Q12H   tadalafil  2.5 mg Oral Daily   Continuous Infusions:  sodium chloride 75 mL/hr at 11/24/21 0334   azithromycin Stopped (11/23/21 1755)   cefTRIAXone (ROCEPHIN)  IV Stopped (11/23/21 1652)      Diet Order             Diet heart healthy/carb modified Room service appropriate? Yes; Fluid consistency: Thin  Diet effective now                  Intake/Output Summary (Last 24 hours) at 11/24/2021 1238 Last data filed at 11/24/2021 0159 Gross per 24 hour  Intake 594.38 ml  Output --  Net 594.38 ml   Net IO Since Admission: 594.38 mL [11/24/21 1238]  Wt Readings  from Last 3 Encounters:  11/23/21 114.3 kg  11/11/21 113.9 kg  10/01/21 117.5 kg     Unresulted Labs (From admission, onward)     Start     Ordered   11/24/21 1112  Urinalysis, Routine w reflex microscopic Urine, Clean Catch  Once,   R        11/24/21 1111   11/23/21 1500  Blood Culture (routine x 2)  (Septic presentation on arrival (screening labs, nursing and treatment orders for obvious sepsis))  BLOOD CULTURE X 2,   STAT      11/23/21 1500   11/23/21 1157  Urine Culture  Once,   URGENT       Question:  Indication  Answer:  Dysuria   11/23/21 1157          Data Reviewed: I have personally reviewed following labs and imaging studies CBC: Recent Labs  Lab 11/23/21 1204 11/24/21 0114  WBC 16.6* 12.2*  NEUTROABS 14.2*  --   HGB 17.3* 14.9  HCT 51.1 43.6  MCV 88.0 87.7  PLT  233 976   Basic Metabolic Panel: Recent Labs  Lab 11/23/21 1204 11/24/21 0114  NA 136 135  K 3.8 3.4*  CL 103 105  CO2 21* 25  GLUCOSE 236* 190*  BUN 20 24*  CREATININE 1.47* 1.56*  CALCIUM 8.8* 8.3*  MG  --  2.0   GFR: Estimated Creatinine Clearance: 49.3 mL/min (A) (by C-G formula based on SCr of 1.56 mg/dL (H)). Liver Function Tests: Recent Labs  Lab 11/23/21 1204  AST 21  ALT 28  ALKPHOS 64  BILITOT 2.0*  PROT 7.2  ALBUMIN 2.8*   No results for input(s): "LIPASE", "AMYLASE" in the last 168 hours. No results for input(s): "AMMONIA" in the last 168 hours. Coagulation Profile: Recent Labs  Lab 11/23/21 1538  INR 1.3*   BNP (last 3 results) No results for input(s): "PROBNP" in the last 8760 hours. HbA1C: No results for input(s): "HGBA1C" in the last 72 hours. CBG: Recent Labs  Lab 11/23/21 1210 11/23/21 2159 11/24/21 1040  GLUCAP 246* 255* 125*   Lipid Profile: No results for input(s): "CHOL", "HDL", "LDLCALC", "TRIG", "CHOLHDL", "LDLDIRECT" in the last 72 hours. Thyroid Function Tests: No results for input(s): "TSH", "T4TOTAL", "FREET4", "T3FREE", "THYROIDAB" in  the last 72 hours. Sepsis Labs: Recent Labs  Lab 11/23/21 1538 11/23/21 2132  LATICACIDVEN 3.1* 1.3    Recent Results (from the past 240 hour(s))  Resp Panel by RT-PCR (Flu A&B, Covid) Anterior Nasal Swab     Status: None   Collection Time: 11/23/21  3:00 PM   Specimen: Anterior Nasal Swab  Result Value Ref Range Status   SARS Coronavirus 2 by RT PCR NEGATIVE NEGATIVE Final    Comment: (NOTE) SARS-CoV-2 target nucleic acids are NOT DETECTED.  The SARS-CoV-2 RNA is generally detectable in upper respiratory specimens during the acute phase of infection. The lowest concentration of SARS-CoV-2 viral copies this assay can detect is 138 copies/mL. A negative result does not preclude SARS-Cov-2 infection and should not be used as the sole basis for treatment or other patient management decisions. A negative result may occur with  improper specimen collection/handling, submission of specimen other than nasopharyngeal swab, presence of viral mutation(s) within the areas targeted by this assay, and inadequate number of viral copies(<138 copies/mL). A negative result must be combined with clinical observations, patient history, and epidemiological information. The expected result is Negative.  Fact Sheet for Patients:  EntrepreneurPulse.com.au  Fact Sheet for Healthcare Providers:  IncredibleEmployment.be  This test is no t yet approved or cleared by the [place] FDA and  has been authorized for detection and/or diagnosis of SARS-CoV-2 by FDA under an Emergency Use Authorization (EUA). This EUA will remain  in effect (meaning this test can be used) for the duration of the COVID-19 declaration under Section 564(b)(1) of the Act, 21 U.S.C.section 360bbb-3(b)(1), unless the authorization is terminated  or revoked sooner.       Influenza A by PCR NEGATIVE NEGATIVE Final   Influenza B by PCR NEGATIVE NEGATIVE Final    Comment: (NOTE) The Xpert  Xpress SARS-CoV-2/FLU/RSV plus assay is intended as an aid in the diagnosis of influenza from Nasopharyngeal swab specimens and should not be used as a sole basis for treatment. Nasal washings and aspirates are unacceptable for Xpert Xpress SARS-CoV-2/FLU/RSV testing.  Fact Sheet for Patients: EntrepreneurPulse.com.au  Fact Sheet for Healthcare Providers: IncredibleEmployment.be  This test is not yet approved or cleared by the [place] FDA and has been authorized for detection and/or diagnosis of SARS-CoV-2  by FDA under an Emergency Use Authorization (EUA). This EUA will remain in effect (meaning this test can be used) for the duration of the COVID-19 declaration under Section 564(b)(1) of the Act, 21 U.S.C. section 360bbb-3(b)(1), unless the authorization is terminated or revoked.  Performed at [facility], [place] [address] [place], [place] [address]   Blood Culture (routine x 2)     Status: None (Preliminary result)   Collection Time: 11/23/21  3:00 PM   Specimen: BLOOD  Result Value Ref Range Status   Specimen Description BLOOD LEFT ANTECUBITAL  Final   Special Requests   Final    BOTTLES DRAWN AEROBIC AND ANAEROBIC Blood Culture results may not be optimal due to an excessive volume of blood received in culture bottles   Culture   Final    NO GROWTH < 24 HOURS Performed at [facility], [place] [address], [place], [place] [address]    Report Status PENDING  Incomplete    Antimicrobials: Anti-infectives (From admission, onward)    Start     Dose/Rate Route Frequency Ordered Stop   11/23/21 1515  cefTRIAXone (ROCEPHIN) 2 g in sodium chloride 0.9 % 100 mL IVPB        2 g 200 mL/hr over 30 Minutes Intravenous Every 24 hours 11/23/21 1500 11/28/21 1514   11/23/21 1515  azithromycin (ZITHROMAX) 500 mg in sodium chloride 0.9 % 250 mL IVPB        500 mg 250 mL/hr over 60 Minutes Intravenous Every 24 hours 11/23/21 1500 11/28/21  1514      Culture/Microbiology    Component Value Date/Time   SDES BLOOD LEFT ANTECUBITAL 11/23/2021 1500   SPECREQUEST  11/23/2021 1500    BOTTLES DRAWN AEROBIC AND ANAEROBIC Blood Culture results may not be optimal due to an excessive volume of blood received in culture bottles   CULT  11/23/2021 1500    NO GROWTH < 24 HOURS Performed at [facility] [address], [place], [place] [address]    REPTSTATUS PENDING 11/23/2021 1500  Other culture-see note  Radiology Studies: DG Chest Port 1 View  Result Date: 11/23/2021 CLINICAL DATA:  Questionable sepsis EXAM: PORTABLE CHEST 1 VIEW COMPARISON:  Chest x-ray 03/13/2016 FINDINGS: There are minimal patchy opacities in the left lung base. The lungs are otherwise clear. There is no pleural effusion or pneumothorax. The cardiomediastinal silhouette is within normal limits. No acute fractures are seen. IMPRESSION: Minimal left basilar atelectasis/airspace disease. Correlate clinically for infection. Electronically Signed   By: Ronney Asters M.D.   On: 11/23/2021 15:33   CT Renal Stone Study  Result Date: 11/23/2021 CLINICAL DATA:  Left flank pain EXAM: CT ABDOMEN AND PELVIS WITHOUT CONTRAST TECHNIQUE: Multidetector CT imaging of the abdomen and pelvis was performed following the standard protocol without IV contrast. RADIATION DOSE REDUCTION: This exam was performed according to the departmental dose-optimization program which includes automated exposure control, adjustment of the mA and/or kV according to patient size and/or use of iterative reconstruction technique. COMPARISON:  03/19/2009, 10/18/2017 FINDINGS: Lower chest: Bibasilar airspace consolidations with trace left pleural effusion. Coronary artery atherosclerosis. Hepatobiliary: No focal liver abnormality is seen. Status post cholecystectomy. No biliary dilatation. Pancreas: Unremarkable. No pancreatic ductal dilatation or surrounding inflammatory changes. Spleen: Normal in size  without focal abnormality. Adrenals/Urinary Tract: 1.1 cm right adrenal gland nodule with internal density of 22 HU, stable from chest CT from 2019 and benign. No follow-up imaging recommended. Multiple nonobstructing stones within the right  kidney, largest measuring up to 7 mm. No left-sided renal calculi. Bilateral ureters are unremarkable. No hydronephrosis. Urinary bladder within normal limits. Stomach/Bowel: Stomach is within normal limits. No evidence of bowel wall thickening, distention, or inflammatory changes. Vascular/Lymphatic: Scattered aortoiliac atherosclerotic calcifications without aneurysm. No abdominopelvic lymphadenopathy. Reproductive: No mass or other abnormality. Other: No free fluid. No abdominopelvic fluid collection. No pneumoperitoneum. Fat containing umbilical hernia. Musculoskeletal: No acute bony findings. Prior L4-5 fusion and right total hip arthroplasty. Findings of diffuse idiopathic skeletal hyperostosis. IMPRESSION: 1. Multiple nonobstructing right renal stones. No hydronephrosis. No explanation for patient's left flank pain. 2. Bibasilar airspace consolidations with trace left pleural effusion, suspicious for multifocal pneumonia or aspiration. 3. Fat containing umbilical hernia. 4. Aortic and coronary artery atherosclerosis (ICD10-I70.0). Electronically Signed   By: Davina Poke D.O.   On: 11/23/2021 13:50     LOS: 0 days   Antonieta Pert, MD [facility]  11/24/2021, 12:38 PM

## 2021-11-24 NOTE — ED Notes (Addendum)
Patient intermittently going into Ventricular Trigeminy, nonsymptomatic at this time.    MD Shalhoub notified about patient new heart rhythm changes. No new orders.

## 2021-11-25 DIAGNOSIS — R531 Weakness: Secondary | ICD-10-CM | POA: Diagnosis not present

## 2021-11-25 LAB — URINE CULTURE

## 2021-11-25 LAB — BASIC METABOLIC PANEL
Anion gap: 10 (ref 5–15)
BUN: 21 mg/dL (ref 8–23)
CO2: 21 mmol/L — ABNORMAL LOW (ref 22–32)
Calcium: 8.6 mg/dL — ABNORMAL LOW (ref 8.9–10.3)
Chloride: 108 mmol/L (ref 98–111)
Creatinine, Ser: 1.07 mg/dL (ref 0.61–1.24)
GFR, Estimated: >60 mL/min (ref >60)
Glucose, Bld: 107 mg/dL — ABNORMAL HIGH (ref 70–99)
Potassium: 3.5 mmol/L (ref 3.5–5.1)
Sodium: 139 mmol/L (ref 135–145)

## 2021-11-25 LAB — CBC
HCT: 43.8 % (ref 39.0–52.0)
Hemoglobin: 14.9 g/dL (ref 13.0–17.0)
MCH: 30.3 pg (ref 26.0–34.0)
MCHC: 34 g/dL (ref 30.0–36.0)
MCV: 89 fL (ref 80.0–100.0)
Platelets: 245 10*3/uL (ref 150–400)
RBC: 4.92 MIL/uL (ref 4.22–5.81)
RDW: 15.5 % (ref 11.5–15.5)
WBC: 9.6 10*3/uL (ref 4.0–10.5)
nRBC: 0 % (ref 0.0–0.2)

## 2021-11-25 LAB — GLUCOSE, CAPILLARY
Glucose-Capillary: 140 mg/dL — ABNORMAL HIGH (ref 70–99)
Glucose-Capillary: 207 mg/dL — ABNORMAL HIGH (ref 70–99)
Glucose-Capillary: 228 mg/dL — ABNORMAL HIGH (ref 70–99)
Glucose-Capillary: 245 mg/dL — ABNORMAL HIGH (ref 70–99)
Glucose-Capillary: 59 mg/dL — ABNORMAL LOW (ref 70–99)
Glucose-Capillary: 63 mg/dL — ABNORMAL LOW (ref 70–99)
Glucose-Capillary: 71 mg/dL (ref 70–99)
Glucose-Capillary: 86 mg/dL (ref 70–99)

## 2021-11-25 LAB — PROCALCITONIN: Procalcitonin: 0.41 ng/mL

## 2021-11-25 MED ORDER — INSULIN GLARGINE-YFGN 100 UNIT/ML ~~LOC~~ SOLN
25.0000 [IU] | Freq: Every day | SUBCUTANEOUS | Status: DC
Start: 2021-11-25 — End: 2021-11-25
  Filled 2021-11-25: qty 0.25

## 2021-11-25 MED ORDER — ENSURE ENLIVE PO LIQD
237.0000 mL | Freq: Two times a day (BID) | ORAL | Status: DC
  Administered 2021-11-25 – 2021-11-28 (×6): 237 mL via ORAL

## 2021-11-25 MED ORDER — ADULT MULTIVITAMIN W/MINERALS CH
1.0000 | ORAL_TABLET | Freq: Every day | ORAL | Status: DC
  Administered 2021-11-25 – 2021-11-28 (×4): 1 via ORAL
  Filled 2021-11-25 (×4): qty 1

## 2021-11-25 MED ORDER — INSULIN GLARGINE-YFGN 100 UNIT/ML ~~LOC~~ SOLN
10.0000 [IU] | Freq: Every day | SUBCUTANEOUS | Status: DC
Start: 2021-11-25 — End: 2021-11-25

## 2021-11-25 MED ORDER — INSULIN GLARGINE-YFGN 100 UNIT/ML ~~LOC~~ SOLN
10.0000 [IU] | Freq: Two times a day (BID) | SUBCUTANEOUS | Status: DC
  Filled 2021-11-25: qty 0.1

## 2021-11-25 MED ORDER — AZITHROMYCIN 500 MG PO TABS
500.0000 mg | ORAL_TABLET | Freq: Every day | ORAL | Status: AC
  Administered 2021-11-25 – 2021-11-27 (×3): 500 mg via ORAL
  Filled 2021-11-25 (×3): qty 1

## 2021-11-25 MED ORDER — INSULIN GLARGINE-YFGN 100 UNIT/ML ~~LOC~~ SOLN
25.0000 [IU] | Freq: Two times a day (BID) | SUBCUTANEOUS | Status: DC
  Administered 2021-11-25 – 2021-11-26 (×2): 25 [IU] via SUBCUTANEOUS
  Filled 2021-11-25 (×3): qty 0.25

## 2021-11-25 MED ORDER — INSULIN GLARGINE-YFGN 100 UNIT/ML ~~LOC~~ SOLN
10.0000 [IU] | Freq: Two times a day (BID) | SUBCUTANEOUS | Status: DC
Start: 2021-11-25 — End: 2021-11-25
  Filled 2021-11-25 (×2): qty 0.1

## 2021-11-25 NOTE — Progress Notes (Signed)
PT Cancellation Note  Patient Details Name: [name] MRN: [id number] DOB: July 15, 1940   Cancelled Treatment:    Reason Eval/Treat Not Completed: Medical issues which prohibited therapy.  Pt is not completely eating his meals, nutrition involved.  Has low BS, will hold for now.   Ramond Dial 11/25/2021, 11:31 AM  Mee Hives, PT PhD Acute Rehab Dept. Number: [place] and [place]

## 2021-11-25 NOTE — Progress Notes (Signed)
Inpatient Diabetes Program Recommendations  AACE/ADA: New Consensus Statement on Inpatient Glycemic Control (2015)  Target Ranges:  Prepandial:   less than 140 mg/dL      Peak postprandial:   less than 180 mg/dL (1-2 hours)      Critically ill patients:  140 - 180 mg/dL   Lab Results  Component Value Date   GLUCAP 140 (H) 11/25/2021   HGBA1C 7.7 (A) 10/01/2021    Review of Glycemic Control  Latest Reference Range & Units 11/25/21 08:09 11/25/21 08:35 11/25/21 08:59 11/25/21 09:28 11/25/21 11:51  Glucose-Capillary 70 - 99 mg/dL 59 (L) 63 (L) 71 86 140 (H)   Diabetes history: DM 2 Outpatient Diabetes medications: Farxiga 10 mg Daily, Trulicity 4.5 mg weekly, Novolog 10 units tid, Toujeo 220 units Qpm  Current orders:   Semglee 25 units q PM Novolog 0-9 units tid with meals  Inpatient Diabetes Program Recommendations:    Note low this AM.  Patient received a total of 100 units of Semglee on 8/28 (which is less than 1/2 of home dose of basal insulin).  Recommend changing Semglee to 35 units bid.  Note plans for d/c to SNF.  Likely will need more insulin as appetite improves.    Thanks,  Adah Perl, RN, BC-ADM Inpatient Diabetes Coordinator Pager [phone]  (8a-5p)

## 2021-11-25 NOTE — Progress Notes (Signed)
Speech Language Pathology Treatment: Dysphagia  Patient Details Name: [name] MRN: [id number] DOB: 1940/06/03 Today's Date: 11/25/2021 Time: 1914-7829 SLP Time Calculation (min) (ACUTE ONLY): 23 min  Assessment / Plan / Recommendation Clinical Impression  Pt's wife, [name], says that he has been coughing a lot with POs over the last 5 years since getting bells palsy, but that this is the first time he has had PNA. She also has observed a lot of anterior loss of food and drink. This was not observed during PO trials today, but he did appear to cough after thin liquids when drinking more rapid, sequential boluses - regardless of whether he was using a cup or a straw. Discussed signs of possible aspiration in the setting of PNA with recommendation for MBS to better evaluate oropharyngeal function. Until MBS can be scheduled (likely next date at the earliest), will continue current diet but reinforced the use of small, single sips and taking a break from this too if frequent coughing is noted. They are both in agreement with plan.    HPI HPI: Pt is a 81 y.o. male who presented with generalized weakness, has been falling. CXR (11/23/21) revealed "Minimal left basilar atelectasis/airspace disease". Per MD note (8/28) there are concerns for possible PNA. PMH: bells palsy (x2, on each side) DM, HTN, HLD and RLS.      SLP Plan  Continue with current plan of care      Recommendations for follow up therapy are one component of a multi-disciplinary discharge planning process, led by the attending physician.  Recommendations may be updated based on patient status, additional functional criteria and insurance authorization.    Recommendations  Diet recommendations: Regular;Thin liquid Liquids provided via: Cup;Straw (one sip at a time) Medication Administration: Whole meds with liquid Supervision: Patient able to self feed;Intermittent supervision to cue for compensatory strategies Compensations:  Minimize environmental distractions;Slow rate;Small sips/bites Postural Changes and/or Swallow Maneuvers: Seated upright 90 degrees                Oral Care Recommendations: Oral care BID Follow Up Recommendations: Other (comment) (tba) Assistance recommended at discharge: Intermittent Supervision/Assistance SLP Visit Diagnosis: Dysphagia, unspecified (R13.10) Plan: Continue with current plan of care           Osie Bond., M.A. [place] Office [phone]  Secure chat preferred   11/25/2021, 1:44 PM

## 2021-11-25 NOTE — Progress Notes (Addendum)
PROGRESS NOTE [name]  JYN:[id number] DOB: September 05, 1940 DOA: 11/23/2021 PCP: Tonia Ghent, MD   Brief Narrative/Hospital Course: 81 y.o.m w/ history significant of DM; HTN; HLD; and RLS presented with generalized weakness, has been falling, had a Friday night and 8/27 am, has been very weak, not light-headed or confused. He also fell about 3 weeks ago, said he missed the chair. , He has not had LOC. Wife reports a very foul urinary odor, no real urinary symptoms In QM:VHQIONGEXBM, positive for cough,CT nondistorted shows multiple nonobstructing protractedly stone,bibasilar airspace consolidation with trace left pleural effusion, suspicious for multifocal pneumonia or aspiration.He RBC 20-50, WBC 20-50, LE +, Nitrite neg. lactic acid 3.1.resp panel neg.Urine and blood culture sent. Patient admitted for generalized weakness recurrent falls, work-up not consistent with infection sepsis ruled out.  Patient started on IV fluids, repeat labs lactic acid 1.3, wbc down to 12.2, creat 1.5 Patient seen by PT OT advised skilled nursing facility, changed to DNR by palliative care.  Dehydration AKI resolved WC count normalized    Subjective: Seen and examined.  Hypoglycemia continuing to 63 this morning > improved to 86  Insulin held  Has incontinence but denies dysuria cough fever chills  Assessment and Plan: Principal Problem:   Generalized weakness Active Problems:   HTN (hypertension)   Skin irritation   Pressure ulcer, buttock   Benign prostatic hyperplasia with urinary obstruction   Dyslipidemia   Type 2 diabetes mellitus with stage 3a chronic kidney disease, with long-term current use of insulin (HCC)   Glaucoma   Failure to thrive in adult  Generalized weakness Debility Recurrent falls Failure to thrive: Suspect multifactorial in the setting of multiple comorbidities, infection,deconditioning.  We will continue on PT OT> has advised skilled nursing facility.  Palliative care is  following now  DNR.  Continue supportive care.  Leukocytosis Pneumonia: Urinary incontinence rule out UTI: Suspected pneumonia on admission, CT renal stone bibasilar airspace consolidation noted, on empiric antibiotics as above, leukocytosis improved.  Procalcitonin was 0.4.  Check urine antigen Legionella antigen.  Patient has incontinence with foul order, UA abnormal urine culture pending.Had initial lactic acidosis in the ED and has resolved.  Speech eval requested to rule out aspiration issues. Recent Labs  Lab 11/23/21 1204 11/23/21 1538 11/23/21 2132 11/24/21 0114 11/25/21 0147 11/25/21 0353  WBC 16.6*  --   --  12.2*  --  9.6  LATICACIDVEN  --  3.1* 1.3  --   --   --   PROCALCITON  --   --   --  0.52 0.41  --      Diabetes mellitus with uncontrolled hyperglycemia A1c 7.7.  With hypoglycemia. he is on high-dose Toujeo 220 units at home apparently and premeal insulin novolog 10 u tid- was placed on Semglee 50 units twice daily, SSI> but had hypoglycemia and 59.  We will change his to [facility] 25 units bedtime, continue SSI.  Also on weekly  Trulicity at home.  Pharmacy  verified home insulin regimen.  Encouraged oral intake Recent Labs  Lab 11/24/21 1958 11/25/21 0809 11/25/21 0835 11/25/21 0859 11/25/21 0928  GLUCAP 87 59* 63* 71 86    Trigeminy in the telemetry in ED: Asymptomatic, monitor and replace electrolytes.  Monitor on telemetry.  Continue his Toprol  Hypertension Hyperlipidemia: BP stable, continue amlodipine 10 mg, Toprol.  Continue his Lipitor and Lopid.  Mild hypokalemia r was replaced.  AKI: Creatinine improved to 1.0 with IV fluids, previous creatinine level was fluctuating from  1.1-1.5 likely had AKI on presentation.  Wean down IV fluids Recent Labs  Lab 11/23/21 1204 11/24/21 0114 11/25/21 0147  BUN 20 24* 21  CREATININE 1.47* 1.56* 1.07   BPH: Continue tadafil.  Having urinary incontinence, no control on urinary stream  Glaucoma continues Cosopt  and latanoprost   Deep tissue injury to bilateral buttock POA: Continue wound care wound nurse input appreciated, continue offloading   Class I Obesity:Patient's Body mass index is 34.18 kg/m. : Will benefit with PCP follow-up, weight loss  healthy lifestyle.   DVT prophylaxis: SCD's Start: 11/25/21 0954 Code Status:   Code Status: DNR Family Communication: plan of care discussed with patient/no family at bedside. Called wife no answer.  Patient status is: Inpatient for ongoing management of pneumonia, and will need skilled nursing facility placement  Level of care: Telemetry Medical   Dispo: The patient is from: home w/ wife            Anticipated disposition: TBD.  PT OT eval requested  Mobility Assessment (last 72 hours)     Mobility Assessment     Row Name 11/24/21 2200 11/24/21 1500 11/24/21 1300       Does patient have an order for bedrest or is patient medically unstable No - Continue assessment -- --     What is the highest level of mobility based on the progressive mobility assessment? Level 4 (Walks with assist in room) - Balance while marching in place and cannot step forward and back - Complete Level 4 (Walks with assist in room) - Balance while marching in place and cannot step forward and back - Complete Level 3 (Stands with assist) - Balance while standing  and cannot march in place               Objective: Vitals last 24 hrs: Vitals:   11/24/21 1517 11/24/21 1956 11/25/21 0357 11/25/21 0932  BP: (!) 145/75 (!) 156/84 (!) 164/82 (!) 152/85  Pulse: 95 97 96 86  Resp: '17 20 20   '$ Temp: 100.1 F (37.8 C) 99.3 F (37.4 C) 99 F (37.2 C)   TempSrc: Oral Oral Oral   SpO2: 95% 91% 90%   Weight:      Height:       Weight change:   Physical Examination: General exam: AAox3, appears ill looking frail elderly, older than stated age, weak appearing. HEENT:Oral mucosa moist, Ear/Nose WNL grossly, dentition normal. Respiratory system: bilaterally diminished, no  use of accessory muscle Cardiovascular system: S1 & S2 +, No JVD,. Gastrointestinal system: Abdomen soft,NT,ND,BS+ Nervous System:Alert, awake, moving extremities and grossly nonfocal Extremities: LE ankle edema neg, distal peripheral pulses palpable.  Skin: No rashes,no icterus. MSK: Normal muscle bulk,tone, power   Medications reviewed:  Scheduled Meds:  allopurinol  150 mg Oral Daily   amLODipine  10 mg Oral Daily   aspirin EC  81 mg Oral Daily   atorvastatin  10 mg Oral QPM   docusate sodium  100 mg Oral BID   dorzolamide-timolol  1 drop Left Eye BID   enoxaparin (LOVENOX) injection  55 mg Subcutaneous Q24H   feeding supplement  237 mL Oral BID BM   gemfibrozil  600 mg Oral BID AC   insulin aspart  0-9 Units Subcutaneous TID WC   insulin glargine-yfgn  10 Units Subcutaneous BID   latanoprost  1 drop Both Eyes QHS   metoprolol succinate  25 mg Oral Daily   multivitamin with minerals  1 tablet Oral Daily  nystatin   Topical BID   sodium chloride flush  3 mL Intravenous Q12H   tadalafil  2.5 mg Oral Daily   Continuous Infusions:  sodium chloride 75 mL/hr at 11/25/21 0828   azithromycin Stopped (11/24/21 1841)   cefTRIAXone (ROCEPHIN)  IV Stopped (11/24/21 1643)    Pressure Injury 11/23/21 Sacrum Right;Left Stage 2 -  Partial thickness loss of dermis presenting as a shallow open injury with a red, pink wound bed without slough. (Active)  11/23/21   Location: Sacrum  Location Orientation: Right;Left  Staging: Stage 2 -  Partial thickness loss of dermis presenting as a shallow open injury with a red, pink wound bed without slough.  Wound Description (Comments):   Present on Admission: Yes  Dressing Type Foam - Lift dressing to assess site every shift 11/25/21 0500   Diet Order             Diet regular Room service appropriate? Yes; Fluid consistency: Thin  Diet effective now                  Intake/Output Summary (Last 24 hours) at 11/25/2021 1129 Last data filed  at 11/24/2021 1842 Gross per 24 hour  Intake 1736.87 ml  Output --  Net 1736.87 ml    Net IO Since Admission: 2,331.25 mL [11/25/21 1129]  Wt Readings from Last 3 Encounters:  11/23/21 114.3 kg  11/11/21 113.9 kg  10/01/21 117.5 kg     Unresulted Labs (From admission, onward)     Start     Ordered   11/25/21 0500  Procalcitonin  Daily at 5am,   R      11/24/21 1247   11/25/21 1572  Basic metabolic panel  Daily at 5am,   R      11/24/21 1247   11/25/21 0500  CBC  Daily at 5am,   R      11/24/21 1247   11/24/21 1112  Urinalysis, Routine w reflex microscopic Urine, Clean Catch  Once,   R        11/24/21 1111   11/23/21 1500  Blood Culture (routine x 2)  (Septic presentation on arrival (screening labs, nursing and treatment orders for obvious sepsis))  BLOOD CULTURE X 2,   STAT      11/23/21 1500   11/23/21 1157  Urine Culture  Once,   URGENT       Question:  Indication  Answer:  Dysuria   11/23/21 1157          Data Reviewed: I have personally reviewed following labs and imaging studies CBC: Recent Labs  Lab 11/23/21 1204 11/24/21 0114 11/25/21 0353  WBC 16.6* 12.2* 9.6  NEUTROABS 14.2*  --   --   HGB 17.3* 14.9 14.9  HCT 51.1 43.6 43.8  MCV 88.0 87.7 89.0  PLT 233 225 620    Basic Metabolic Panel: Recent Labs  Lab 11/23/21 1204 11/24/21 0114 11/25/21 0147  NA 136 135 139  K 3.8 3.4* 3.5  CL 103 105 108  CO2 21* 25 21*  GLUCOSE 236* 190* 107*  BUN 20 24* 21  CREATININE 1.47* 1.56* 1.07  CALCIUM 8.8* 8.3* 8.6*  MG  --  2.0  --     GFR: Estimated Creatinine Clearance: 71.9 mL/min (by C-G formula based on SCr of 1.07 mg/dL). Liver Function Tests: Recent Labs  Lab 11/23/21 1204  AST 21  ALT 28  ALKPHOS 64  BILITOT 2.0*  PROT 7.2  ALBUMIN 2.8*  No results for input(s): "LIPASE", "AMYLASE" in the last 168 hours. No results for input(s): "AMMONIA" in the last 168 hours. Coagulation Profile: Recent Labs  Lab 11/23/21 1538  INR 1.3*     BNP (last 3 results) No results for input(s): "PROBNP" in the last 8760 hours. HbA1C: No results for input(s): "HGBA1C" in the last 72 hours. CBG: Recent Labs  Lab 11/24/21 1958 11/25/21 0809 11/25/21 0835 11/25/21 0859 11/25/21 0928  GLUCAP 87 59* 63* 71 86    Lipid Profile: No results for input(s): "CHOL", "HDL", "LDLCALC", "TRIG", "CHOLHDL", "LDLDIRECT" in the last 72 hours. Thyroid Function Tests: No results for input(s): "TSH", "T4TOTAL", "FREET4", "T3FREE", "THYROIDAB" in the last 72 hours. Sepsis Labs: Recent Labs  Lab 11/23/21 1538 11/23/21 2132 11/24/21 0114 11/25/21 0147  PROCALCITON  --   --  0.52 0.41  LATICACIDVEN 3.1* 1.3  --   --      Recent Results (from the past 240 hour(s))  Resp Panel by RT-PCR (Flu A&B, Covid) Anterior Nasal Swab     Status: None   Collection Time: 11/23/21  3:00 PM   Specimen: Anterior Nasal Swab  Result Value Ref Range Status   SARS Coronavirus 2 by RT PCR NEGATIVE NEGATIVE Final    Comment: (NOTE) SARS-CoV-2 target nucleic acids are NOT DETECTED.  The SARS-CoV-2 RNA is generally detectable in upper respiratory specimens during the acute phase of infection. The lowest concentration of SARS-CoV-2 viral copies this assay can detect is 138 copies/mL. A negative result does not preclude SARS-Cov-2 infection and should not be used as the sole basis for treatment or other patient management decisions. A negative result may occur with  improper specimen collection/handling, submission of specimen other than nasopharyngeal swab, presence of viral mutation(s) within the areas targeted by this assay, and inadequate number of viral copies(<138 copies/mL). A negative result must be combined with clinical observations, patient history, and epidemiological information. The expected result is Negative.  Fact Sheet for Patients:  EntrepreneurPulse.com.au  Fact Sheet for Healthcare Providers:   IncredibleEmployment.be  This test is no t yet approved or cleared by the [place] FDA and  has been authorized for detection and/or diagnosis of SARS-CoV-2 by FDA under an Emergency Use Authorization (EUA). This EUA will remain  in effect (meaning this test can be used) for the duration of the COVID-19 declaration under Section 564(b)(1) of the Act, 21 U.S.C.section 360bbb-3(b)(1), unless the authorization is terminated  or revoked sooner.       Influenza A by PCR NEGATIVE NEGATIVE Final   Influenza B by PCR NEGATIVE NEGATIVE Final    Comment: (NOTE) The Xpert Xpress SARS-CoV-2/FLU/RSV plus assay is intended as an aid in the diagnosis of influenza from Nasopharyngeal swab specimens and should not be used as a sole basis for treatment. Nasal washings and aspirates are unacceptable for Xpert Xpress SARS-CoV-2/FLU/RSV testing.  Fact Sheet for Patients: EntrepreneurPulse.com.au  Fact Sheet for Healthcare Providers: IncredibleEmployment.be  This test is not yet approved or cleared by the [place] FDA and has been authorized for detection and/or diagnosis of SARS-CoV-2 by FDA under an Emergency Use Authorization (EUA). This EUA will remain in effect (meaning this test can be used) for the duration of the COVID-19 declaration under Section 564(b)(1) of the Act, 21 U.S.C. section 360bbb-3(b)(1), unless the authorization is terminated or revoked.  Performed at [facility], [place] [address], [place], [place] [address]   Blood Culture (routine x 2)     Status: None (Preliminary  result)   Collection Time: 11/23/21  3:00 PM   Specimen: BLOOD  Result Value Ref Range Status   Specimen Description BLOOD LEFT ANTECUBITAL  Final   Special Requests   Final    BOTTLES DRAWN AEROBIC AND ANAEROBIC Blood Culture results may not be optimal due to an excessive volume of blood received in culture bottles   Culture   Final     NO GROWTH 2 DAYS Performed at [facility] [address], [place], [place] [address]    Report Status PENDING  Incomplete    Antimicrobials: Anti-infectives (From admission, onward)    Start     Dose/Rate Route Frequency Ordered Stop   11/23/21 1515  cefTRIAXone (ROCEPHIN) 2 g in sodium chloride 0.9 % 100 mL IVPB        2 g 200 mL/hr over 30 Minutes Intravenous Every 24 hours 11/23/21 1500 11/28/21 1514   11/23/21 1515  azithromycin (ZITHROMAX) 500 mg in sodium chloride 0.9 % 250 mL IVPB        500 mg 250 mL/hr over 60 Minutes Intravenous Every 24 hours 11/23/21 1500 11/28/21 1514      Culture/Microbiology    Component Value Date/Time   SDES BLOOD LEFT ANTECUBITAL 11/23/2021 1500   SPECREQUEST  11/23/2021 1500    BOTTLES DRAWN AEROBIC AND ANAEROBIC Blood Culture results may not be optimal due to an excessive volume of blood received in culture bottles   CULT  11/23/2021 1500    NO GROWTH 2 DAYS Performed at [facility] [address] [place], [place] [address]    REPTSTATUS PENDING 11/23/2021 1500  Other culture-see note  Radiology Studies: DG Chest Port 1 View  Result Date: 11/23/2021 CLINICAL DATA:  Questionable sepsis EXAM: PORTABLE CHEST 1 VIEW COMPARISON:  Chest x-ray 03/13/2016 FINDINGS: There are minimal patchy opacities in the left lung base. The lungs are otherwise clear. There is no pleural effusion or pneumothorax. The cardiomediastinal silhouette is within normal limits. No acute fractures are seen. IMPRESSION: Minimal left basilar atelectasis/airspace disease. Correlate clinically for infection. Electronically Signed   By: Ronney Asters M.D.   On: 11/23/2021 15:33   CT Renal Stone Study  Result Date: 11/23/2021 CLINICAL DATA:  Left flank pain EXAM: CT ABDOMEN AND PELVIS WITHOUT CONTRAST TECHNIQUE: Multidetector CT imaging of the abdomen and pelvis was performed following the standard protocol without IV contrast. RADIATION DOSE REDUCTION: This exam  was performed according to the departmental dose-optimization program which includes automated exposure control, adjustment of the mA and/or kV according to patient size and/or use of iterative reconstruction technique. COMPARISON:  03/19/2009, 10/18/2017 FINDINGS: Lower chest: Bibasilar airspace consolidations with trace left pleural effusion. Coronary artery atherosclerosis. Hepatobiliary: No focal liver abnormality is seen. Status post cholecystectomy. No biliary dilatation. Pancreas: Unremarkable. No pancreatic ductal dilatation or surrounding inflammatory changes. Spleen: Normal in size without focal abnormality. Adrenals/Urinary Tract: 1.1 cm right adrenal gland nodule with internal density of 22 HU, stable from chest CT from 2019 and benign. No follow-up imaging recommended. Multiple nonobstructing stones within the right kidney, largest measuring up to 7 mm. No left-sided renal calculi. Bilateral ureters are unremarkable. No hydronephrosis. Urinary bladder within normal limits. Stomach/Bowel: Stomach is within normal limits. No evidence of bowel wall thickening, distention, or inflammatory changes. Vascular/Lymphatic: Scattered aortoiliac atherosclerotic calcifications without aneurysm. No abdominopelvic lymphadenopathy. Reproductive: No mass or other abnormality. Other: No free fluid. No abdominopelvic fluid collection. No pneumoperitoneum. Fat containing umbilical hernia. Musculoskeletal: No acute bony findings. Prior  L4-5 fusion and right total hip arthroplasty. Findings of diffuse idiopathic skeletal hyperostosis. IMPRESSION: 1. Multiple nonobstructing right renal stones. No hydronephrosis. No explanation for patient's left flank pain. 2. Bibasilar airspace consolidations with trace left pleural effusion, suspicious for multifocal pneumonia or aspiration. 3. Fat containing umbilical hernia. 4. Aortic and coronary artery atherosclerosis (ICD10-I70.0). Electronically Signed   By: Davina Poke D.O.    On: 11/23/2021 13:50     LOS: 1 day   Antonieta Pert, MD [facility]  11/25/2021, 11:29 AM

## 2021-11-25 NOTE — Plan of Care (Signed)
  Problem: Activity: Goal: Ability to tolerate increased activity will improve 11/25/2021 1642 by Bridgette Habermann, RN Outcome: Progressing 11/25/2021 0818 by Bridgette Habermann, RN Outcome: Progressing   Problem: Clinical Measurements: Goal: Ability to maintain a body temperature in the normal range will improve 11/25/2021 1642 by Bridgette Habermann, RN Outcome: Progressing 11/25/2021 0818 by Bridgette Habermann, RN Outcome: Progressing   Problem: Respiratory: Goal: Ability to maintain adequate ventilation will improve Outcome: Progressing Goal: Ability to maintain a clear airway will improve Outcome: Progressing   Problem: Education: Goal: Knowledge of General Education information will improve Description: Including pain rating scale, medication(s)/side effects and non-pharmacologic comfort measures 11/25/2021 1642 by Bridgette Habermann, RN Outcome: Progressing 11/25/2021 0818 by Bridgette Habermann, RN Outcome: Progressing   Problem: Health Behavior/Discharge Planning: Goal: Ability to manage health-related needs will improve 11/25/2021 1642 by Bridgette Habermann, RN Outcome: Progressing 11/25/2021 0818 by Bridgette Habermann, RN Outcome: Progressing   Problem: Clinical Measurements: Goal: Ability to maintain clinical measurements within normal limits will improve Outcome: Progressing Goal: Will remain free from infection Outcome: Progressing Goal: Diagnostic test results will improve Outcome: Progressing Goal: Respiratory complications will improve Outcome: Progressing Goal: Cardiovascular complication will be avoided Outcome: Progressing   Problem: Activity: Goal: Risk for activity intolerance will decrease Outcome: Progressing   Problem: Nutrition: Goal: Adequate nutrition will be maintained Outcome: Progressing   Problem: Safety: Goal: Ability to remain free from injury will improve Outcome: Progressing

## 2021-11-25 NOTE — TOC Initial Note (Signed)
Transition of Care [facility]) - Initial/Assessment Note    Patient Details  Name: [name] MRN: [id number] Date of Birth: 1941-01-03  Transition of Care [facility]) CM/SW Contact:    Joanne Chars, LCSW Phone Number: 11/25/2021, 1:46 PM  Clinical Narrative:    CSW met with pt and wife PHyllis to discuss DC recommendation for SNF.  Permission given to speak with wife present.  They are agreeable to SNF, choice document given, permission given to send out referral in hub.  Wife was at [facility] in the past, but open to other options as well.  Pt lives at home with wife, no current services.  Pt reports he is vaccinated for covid with one booster.                Expected Discharge Plan: Skilled Nursing Facility Barriers to Discharge: Continued Medical Work up, SNF Pending bed offer   Patient Goals and CMS Choice Patient states their goals for this hospitalization and ongoing recovery are:: "back to health" CMS Medicare.gov Compare Post Acute Care list provided to:: Patient Represenative (must comment) (wife) Choice offered to / list presented to : Patient, Spouse  Expected Discharge Plan and Services Expected Discharge Plan: [place] In-house Referral: Clinical Social Work   Post Acute Care Choice: [place] Living arrangements for the past 2 months: [place]                                      Prior Living Arrangements/Services Living arrangements for the past 2 months: Single Family Home Lives with:: Spouse Patient language and need for interpreter reviewed:: Yes Do you feel safe going back to the place where you live?: Yes      Need for Family Participation in Patient Care: Yes (Comment) Care giver support system in place?: Yes (comment) Current home services: Other (comment) (none) Criminal Activity/Legal Involvement Pertinent to Current Situation/Hospitalization: No - Comment as needed  Activities of Daily Living Home  Assistive Devices/Equipment: Cane (specify quad or straight), Eyeglasses, Walker (specify type) ADL Screening (condition at time of admission) Patient's cognitive ability adequate to safely complete daily activities?: Yes Is the patient deaf or have difficulty hearing?: No Does the patient have difficulty seeing, even when wearing glasses/contacts?: No Does the patient have difficulty concentrating, remembering, or making decisions?: No Patient able to express need for assistance with ADLs?: Yes Does the patient have difficulty dressing or bathing?: No Independently performs ADLs?: Yes (appropriate for developmental age) Does the patient have difficulty walking or climbing stairs?: No Weakness of Legs: None Weakness of Arms/Hands: None  Permission Sought/Granted Permission sought to share information with : Family Supports Permission granted to share information with : Yes, Verbal Permission Granted  Share Information with NAME: wife [name]  Permission granted to share info w AGENCY: SNF        Emotional Assessment Appearance:: Appears stated age Attitude/Demeanor/Rapport: Lethargic Affect (typically observed): Flat Orientation: : Oriented to Self, Oriented to Place, Oriented to  Time, Oriented to Situation Alcohol / Substance Use: Not Applicable Psych Involvement: No (comment)  Admission diagnosis:  Failure to thrive in adult [R62.7] Generalized weakness [R53.1] Urinary tract infection without hematuria, site unspecified [N39.0] Multifocal pneumonia [J18.9] Community acquired pneumonia, unspecified laterality [J18.9] Sepsis, due to unspecified organism, unspecified whether acute organ dysfunction present [facility]) [A41.9] Patient Active Problem List   Diagnosis Date Noted   Failure to  thrive in adult 11/24/2021   Generalized weakness 11/23/2021   Glaucoma 11/23/2021   Open wound of left foot 07/11/2021   Type 2 diabetes mellitus with hyperglycemia, with long-term current use of  insulin ([place]) 05/23/2021   Type 2 diabetes mellitus with stage 3a chronic kidney disease, with long-term current use of insulin ([place]) 05/23/2021   Dyslipidemia 04/11/2021   Dysuria 04/07/2021   Back pain 10/22/2019   Fall at home 09/27/2019   Advance care planning 07/25/2019   Pressure ulcer, buttock 04/17/2019   Skin irritation 03/29/2019   Gout 07/22/2018   HTN (hypertension) 06/11/2017   Lower urinary tract symptoms (LUTS) 03/04/2017   Bell's palsy 10/11/2016   Paresthesia 05/28/2016   Medicare annual wellness visit, subsequent 04/22/2016   Cough 04/22/2016   Allergic rhinitis 03/12/2016   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   Erectile dysfunction 06/11/2015   Rhinorrhea 03/13/2015   S/P revision of total hip 01/10/2015   Dislocation, hip ([place]) 11/09/2014   DJD (degenerative joint disease) 11/06/2014   Primary osteoarthritis of right knee    Primary localized osteoarthritis of right hip    Spinal stenosis of lumbar region with radiculopathy    Diabetes mellitus type 2 in obese (HCC)    PONV (postoperative nausea and vomiting)    GERD (gastroesophageal reflux disease)    Frequent urination at night    Kidney stones    Arthritis    Neuromuscular disorder ([place])    Benign prostatic hyperplasia with urinary obstruction 07/14/2012   Chronic calculus cholecystitis 12/31/2011   Gouty arthropathy 03/01/2009   PCP:  Tonia Ghent, MD Pharmacy:   [facility], [place] [address] Phone: [phone] Fax: [phone]     Social Determinants of Health (SDOH) Interventions    Readmission Risk Interventions     No data to display

## 2021-11-25 NOTE — Plan of Care (Addendum)
  8:15 am :Patient blood sugar 59. Following hypoglycemic protocol. Gave 4 oz of orange juice. Will recheck in 15 minutes.   8:30 am: patients blood sugar 63. Following hypoglycemic protocol will give 4 oz of orange juice. Will recheck in 15 minutes. Patient eating breakfast.  9:00 am: patients blood sugar 71. Gave 4oz of orange juice. Will check in 15 minutes.  Problem: Activity: Goal: Ability to tolerate increased activity will improve Outcome: Progressing   Problem: Clinical Measurements: Goal: Ability to maintain a body temperature in the normal range will improve Outcome: Progressing   Problem: Education: Goal: Knowledge of General Education information will improve Description: Including pain rating scale, medication(s)/side effects and non-pharmacologic comfort measures Outcome: Progressing   Problem: Health Behavior/Discharge Planning: Goal: Ability to manage health-related needs will improve Outcome: Progressing   Problem: Safety: Goal: Ability to remain free from injury will improve Outcome: Progressing

## 2021-11-25 NOTE — Progress Notes (Signed)
Initial Nutrition Assessment  DOCUMENTATION CODES:   Obesity unspecified  INTERVENTION:  Liberalize diet from a heart healthy/carb modified to a regular diet to provide wider variety of menu options to enhance nutritional adequacy Ensure Enlive po BID, each supplement provides 350 kcal and 20 grams of protein. MVI with minerals daily  NUTRITION DIAGNOSIS:   Increased nutrient needs related to acute illness as evidenced by estimated needs.  GOAL:   Patient will meet greater than or equal to 90% of their needs  MONITOR:   PO intake, Supplement acceptance, Labs, Diet advancement, Weight trends, Skin  REASON FOR ASSESSMENT:   Consult Assessment of nutrition requirement/status  ASSESSMENT:   Pt admitted with generalized weakness. PMH significant for DM, HTN, HLD and RLS.  Per chart review, pt noted to be very sedentary, uses a lift chair and mostly sits all day and night. He has been experiencing recurrent falls.   Seen by PMT yesterday. Continue with current medical treatment. Plans for outpatient palliative care referral.    S/p SLP evaluation. Recommend regular diet, thin liquids.   Unsuccessful attempt to reach patient via phone call to room. Unable to obtain nutrition related history at this time.   Given patient's advanced age and presence of stage 2 sacral wound, he would benefit from a liberalized diet to encourage and optimize nutritional adequacy. Will also order nutrition supplements.  Reviewed weight history. Since 10/30/20 he has had a 3.4% weight loss. Witihin the last year, his weight has fluctuated up and down between 110.9 -117.5 kg. Current weight noted to be 114.3 kg. No significant recent weight loss noted.   Medications: colace, SSI 0-9 units TID, semglee 10 units BID IV drips: NaCl @ 75m/hr, abx  Labs: HgbA1c 7.7%, CBG's 63/59/87/165/180/125 x24 hours  NUTRITION - FOCUSED PHYSICAL EXAM: RD working remotely. Deferred to follow up.   Diet Order:    Diet Order             Diet regular Room service appropriate? Yes; Fluid consistency: Thin  Diet effective now                   EDUCATION NEEDS:   No education needs have been identified at this time  Skin:  Skin Assessment: Skin Integrity Issues: Skin Integrity Issues:: Stage II Stage II: sacrum  Last BM:  PTA  Height:   Ht Readings from Last 1 Encounters:  11/23/21 6' (1.829 m)    Weight:   Wt Readings from Last 1 Encounters:  11/23/21 114.3 kg    Ideal Body Weight:  80.9 kg  BMI:  Body mass index is 34.18 kg/m.  Estimated Nutritional Needs:   Kcal:  2100-2300  Protein:  105-120g  Fluid:  >/=2.1L  AClayborne Dana RDN, LDN Clinical Nutrition

## 2021-11-25 NOTE — NC FL2 (Signed)
[place] MEDICAID FL2 LEVEL OF CARE SCREENING TOOL     IDENTIFICATION  Patient Name: [name] Birthdate: 1940/10/27 Sex: male Admission Date (Current Location): 11/23/2021  [facility] and [place] Number:  Herbalist and Address:  [facility] [place] [address] [place], [place] [address]      Provider Number: [id number]  Attending Physician Name and Address:  Antonieta Pert, MD  Relative Name and Phone Number:  [place], [place]   [phone]    Current Level of Care: Hospital Recommended Level of Care: [place] Prior Approval Number:    Date Approved/Denied:   PASRR Number: [id number]  Discharge Plan: SNF    Current Diagnoses: Patient Active Problem List   Diagnosis Date Noted   Failure to thrive in adult 11/24/2021   Generalized weakness 11/23/2021   Glaucoma 11/23/2021   Open wound of left foot 07/11/2021   Type 2 diabetes mellitus with hyperglycemia, with long-term current use of insulin ([place]) 05/23/2021   Type 2 diabetes mellitus with stage 3a chronic kidney disease, with long-term current use of insulin ([place]) 05/23/2021   Dyslipidemia 04/11/2021   Dysuria 04/07/2021   Back pain 10/22/2019   Fall at home 09/27/2019   Advance care planning 07/25/2019   Pressure ulcer, buttock 04/17/2019   Skin irritation 03/29/2019   Gout 07/22/2018   HTN (hypertension) 06/11/2017   Lower urinary tract symptoms (LUTS) 03/04/2017   Bell's palsy 10/11/2016   Paresthesia 05/28/2016   Medicare annual wellness visit, subsequent 04/22/2016   Cough 04/22/2016   Allergic rhinitis 03/12/2016   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   Erectile dysfunction 06/11/2015   Rhinorrhea 03/13/2015   S/P revision of total hip 01/10/2015   Dislocation, hip ([place]) 11/09/2014   DJD (degenerative joint disease) 11/06/2014   Primary osteoarthritis of right knee    Primary localized osteoarthritis of right hip    Spinal stenosis of lumbar region with  radiculopathy    Diabetes mellitus type 2 in obese (HCC)    PONV (postoperative nausea and vomiting)    GERD (gastroesophageal reflux disease)    Frequent urination at night    Kidney stones    Arthritis    Neuromuscular disorder ([place])    Benign prostatic hyperplasia with urinary obstruction 07/14/2012   Chronic calculus cholecystitis 12/31/2011   Gouty arthropathy 03/01/2009    Orientation RESPIRATION BLADDER Height & Weight     Self, Time, Situation, Place  Normal Incontinent Weight: 252 lb (114.3 kg) Height:  6' (182.9 cm)  BEHAVIORAL SYMPTOMS/MOOD NEUROLOGICAL BOWEL NUTRITION STATUS      Continent Diet (see discharge summary)  AMBULATORY STATUS COMMUNICATION OF NEEDS Skin   Limited Assist Verbally Other (Comment) (dry)                       Personal Care Assistance Level of Assistance  Bathing, Feeding, Dressing Bathing Assistance: Maximum assistance Feeding assistance: Limited assistance Dressing Assistance: Maximum assistance     Functional Limitations Info  Sight, Hearing, Speech Sight Info: Adequate Hearing Info: Adequate Speech Info: Adequate    SPECIAL CARE FACTORS FREQUENCY  PT (By licensed PT), OT (By licensed OT)     PT Frequency: 5x week OT Frequency: 5x week            Contractures Contractures Info: Not present    Additional Factors Info  Code Status, Allergies, Insulin Sliding Scale Code Status Info: DNR Allergies Info: Morphine And Related, Codeine Sulfate, Metformin And Related  Insulin Sliding Scale Info: Novolog: see discharge summary       Current Medications (11/25/2021):  This is the current hospital active medication list Current Facility-Administered Medications  Medication Dose Route Frequency Provider Last Rate Last Admin   acetaminophen (TYLENOL) tablet 650 mg  650 mg Oral Q6H PRN Karmen Bongo, MD       Or   acetaminophen (TYLENOL) suppository 650 mg  650 mg Rectal Q6H PRN Karmen Bongo, MD       albuterol  (PROVENTIL) (2.5 MG/3ML) 0.083% nebulizer solution 2.5 mg  2.5 mg Nebulization Q2H PRN Karmen Bongo, MD       allopurinol (ZYLOPRIM) tablet 150 mg  150 mg Oral Daily Karmen Bongo, MD   150 mg at 11/25/21 0934   amLODipine (NORVASC) tablet 10 mg  10 mg Oral Daily Karmen Bongo, MD   10 mg at 11/25/21 5329   aspirin EC tablet 81 mg  81 mg Oral Daily Karmen Bongo, MD   81 mg at 11/25/21 0932   atorvastatin (LIPITOR) tablet 10 mg  10 mg Oral QPM Karmen Bongo, MD   10 mg at 11/24/21 1741   azithromycin (ZITHROMAX) tablet 500 mg  500 mg Oral Daily Donnamae Jude, RPH       bisacodyl (DULCOLAX) EC tablet 5 mg  5 mg Oral Daily PRN Karmen Bongo, MD       cefTRIAXone (ROCEPHIN) 2 g in sodium chloride 0.9 % 100 mL IVPB  2 g Intravenous Q24H Karmen Bongo, MD   Stopped at 11/24/21 1643   docusate sodium (COLACE) capsule 100 mg  100 mg Oral BID Karmen Bongo, MD   100 mg at 11/25/21 0933   dorzolamide-timolol (COSOPT) 22.3-6.8 MG/ML ophthalmic solution 1 drop  1 drop Left Eye BID Karmen Bongo, MD   1 drop at 11/25/21 0935   enoxaparin (LOVENOX) injection 55 mg  55 mg Subcutaneous Q24H Karmen Bongo, MD   55 mg at 11/24/21 2104   feeding supplement (ENSURE ENLIVE / ENSURE PLUS) liquid 237 mL  237 mL Oral BID BM Kc, Ramesh, MD   237 mL at 11/25/21 0939   gemfibrozil (LOPID) tablet 600 mg  600 mg Oral BID Meliton Rattan, MD   600 mg at 11/25/21 0933   guaiFENesin (MUCINEX) 12 hr tablet 600 mg  600 mg Oral BID PRN Karmen Bongo, MD       hydrALAZINE (APRESOLINE) injection 5 mg  5 mg Intravenous Q4H PRN Karmen Bongo, MD       insulin aspart (novoLOG) injection 0-9 Units  0-9 Units Subcutaneous TID WC Kc, Ramesh, MD   1 Units at 11/25/21 1234   insulin glargine-yfgn (SEMGLEE) injection 25 Units  25 Units Subcutaneous QHS Kc, Ramesh, MD       latanoprost (XALATAN) 0.005 % ophthalmic solution 1 drop  1 drop Both Eyes QHS Karmen Bongo, MD   1 drop at 11/25/21 0002   metoprolol  succinate (TOPROL-XL) 24 hr tablet 25 mg  25 mg Oral Daily Karmen Bongo, MD   25 mg at 11/25/21 0932   multivitamin with minerals tablet 1 tablet  1 tablet Oral Daily Antonieta Pert, MD   1 tablet at 11/25/21 9242   nystatin (MYCOSTATIN/NYSTOP) topical powder   Topical BID Karmen Bongo, MD   Given at 11/25/21 1240   ondansetron (ZOFRAN) tablet 4 mg  4 mg Oral Q6H PRN Karmen Bongo, MD       Or   ondansetron [facility]) injection 4 mg  4 mg Intravenous  Q6H PRN Karmen Bongo, MD       polyethylene glycol (MIRALAX / GLYCOLAX) packet 17 g  17 g Oral Daily PRN Karmen Bongo, MD       sodium chloride flush (NS) 0.9 % injection 3 mL  3 mL Intravenous Q12H Karmen Bongo, MD   3 mL at 11/25/21 0939   tadalafil (CIALIS) tablet 2.5 mg  2.5 mg Oral Daily Karmen Bongo, MD   2.5 mg at 11/25/21 0979     Discharge Medications: Please see discharge summary for a list of discharge medications.  Relevant Imaging Results:  Relevant Lab Results:   Additional Information SSN: [id number], pt reports he is vaccinated for covid with one booster.  Joanne Chars, LCSW

## 2021-11-26 ENCOUNTER — Inpatient Hospital Stay (HOSPITAL_COMMUNITY): Payer: HMO

## 2021-11-26 DIAGNOSIS — R531 Weakness: Secondary | ICD-10-CM | POA: Diagnosis not present

## 2021-11-26 LAB — CBC
HCT: 42.8 % (ref 39.0–52.0)
Hemoglobin: 14.7 g/dL (ref 13.0–17.0)
MCH: 29.9 pg (ref 26.0–34.0)
MCHC: 34.3 g/dL (ref 30.0–36.0)
MCV: 87.2 fL (ref 80.0–100.0)
Platelets: 251 10*3/uL (ref 150–400)
RBC: 4.91 MIL/uL (ref 4.22–5.81)
RDW: 15.3 % (ref 11.5–15.5)
WBC: 9.7 10*3/uL (ref 4.0–10.5)
nRBC: 0 % (ref 0.0–0.2)

## 2021-11-26 LAB — GLUCOSE, CAPILLARY
Glucose-Capillary: 199 mg/dL — ABNORMAL HIGH (ref 70–99)
Glucose-Capillary: 177 mg/dL — ABNORMAL HIGH (ref 70–99)
Glucose-Capillary: 245 mg/dL — ABNORMAL HIGH (ref 70–99)
Glucose-Capillary: 269 mg/dL — ABNORMAL HIGH (ref 70–99)
Glucose-Capillary: 281 mg/dL — ABNORMAL HIGH (ref 70–99)
Glucose-Capillary: 252 mg/dL — ABNORMAL HIGH (ref 70–99)

## 2021-11-26 LAB — BASIC METABOLIC PANEL
Anion gap: 17 — ABNORMAL HIGH (ref 5–15)
BUN: 25 mg/dL — ABNORMAL HIGH (ref 8–23)
CO2: 21 mmol/L — ABNORMAL LOW (ref 22–32)
Calcium: 8.9 mg/dL (ref 8.9–10.3)
Chloride: 98 mmol/L (ref 98–111)
Creatinine, Ser: 1.26 mg/dL — ABNORMAL HIGH (ref 0.61–1.24)
GFR, Estimated: 58 mL/min — ABNORMAL LOW (ref >60)
Glucose, Bld: 214 mg/dL — ABNORMAL HIGH (ref 70–99)
Potassium: 3.7 mmol/L (ref 3.5–5.1)
Sodium: 136 mmol/L (ref 135–145)

## 2021-11-26 LAB — STREP PNEUMONIAE URINARY ANTIGEN: Strep Pneumo Urinary Antigen: NEGATIVE

## 2021-11-26 LAB — PROCALCITONIN: Procalcitonin: 0.25 ng/mL

## 2021-11-26 MED ORDER — FUROSEMIDE 10 MG/ML IJ SOLN
40.0000 mg | Freq: Once | INTRAMUSCULAR | Status: AC
  Administered 2021-11-26: 40 mg via INTRAVENOUS
  Filled 2021-11-26: qty 4

## 2021-11-26 MED ORDER — INSULIN GLARGINE-YFGN 100 UNIT/ML ~~LOC~~ SOLN
30.0000 [IU] | Freq: Two times a day (BID) | SUBCUTANEOUS | Status: DC
  Administered 2021-11-26 – 2021-11-27 (×2): 30 [IU] via SUBCUTANEOUS
  Filled 2021-11-26 (×3): qty 0.3

## 2021-11-26 NOTE — Progress Notes (Signed)
PROGRESS NOTE    [name]  NAT:[id number] DOB: April 21, 1940 DOA: 11/23/2021 PCP: Tonia Ghent, MD     Brief Narrative:  81 y.o.m w/ history significant of DM; HTN; HLD; and RLS presented with generalized weakness, has been falling, had a Friday night and 8/27 am, has been very weak, not light-headed or confused. He also fell about 3 weeks ago, said he missed the chair. , He has not had LOC. Wife reports a very foul urinary odor, no real urinary symptoms In [place]:[id number], positive for cough,CT nondistorted shows multiple nonobstructing protractedly stone,bibasilar airspace consolidation with trace left pleural effusion, suspicious for multifocal pneumonia or aspiration.He RBC 20-50, WBC 20-50, LE +, Nitrite neg. lactic acid 3.1.resp panel neg.Urine and blood culture sent. Patient admitted for generalized weakness recurrent falls, work-up not consistent with infection sepsis ruled out.  Patient started on IV fluids, repeat labs lactic acid 1.3, wbc down to 12.2, creat 1.5 Patient seen by PT OT advised skilled nursing facility, changed to DNR by palliative care.  Dehydration AKI resolved WC count normalized   Subjective:   Returned from swallow study, appear very weak Left lower lobe diminished, bilateral pedal edema, will get cxr Cr fluctuating  PVR after voiding 700cc is 144cc  Assessment & Plan:  Principal Problem:   Generalized weakness Active Problems:   HTN (hypertension)   Skin irritation   Pressure ulcer, buttock   Benign prostatic hyperplasia with urinary obstruction   Dyslipidemia   Type 2 diabetes mellitus with stage 3a chronic kidney disease, with long-term current use of insulin (HCC)   Glaucoma   Failure to thrive in adult   FTT -Presented to the hospital with weakness, ankle wrist falls -seen by palliative care, PT/OT, plan to discharge to snf with palliative care continue to follow  Leukocytosis, aspiration pneumonia, UTI CT renal stone bibasilar  airspace consolidation noted,  Received abx , leukocytosis resolved Seen by speech, currently on soft diet/nectar thick liquid Appear volume overloaded, will give 1 dose of IV Lasix, reassess in the morning  AKI vs CKD II/III Cr fluctuating Check bladder scan Monitor volume, reports poor appetite but has bilateral lower extremity edema  Hypokalemia Replaced  Insulin-dependent type 2 diabetes, with hyperglycemia Increase long-acting insulin Repeat BMP in the morning   HTN HLD Stable on home meds  BPH: Continue tadafil.  Having urinary incontinence, no control on urinary stream, monitor bladder scan   Glaucoma continues Cosopt and latanoprost     Nutritional Assessment: The patient's BMI is: Body mass index is 34.18 kg/m.[name] Seen by dietician.  I agree with the assessment and plan as outlined below: Nutrition Status: Nutrition Problem: Increased nutrient needs Etiology: acute illness Signs/Symptoms: estimated needs Interventions: Ensure Enlive (each supplement provides 350kcal and 20 grams of protein), MVI, Liberalize Diet  .    Skin Assessment: I have examined the patient's skin and I agree with the wound assessment as performed by the wound care RN as outlined below: Pressure Injury 11/23/21 Sacrum Right;Left Stage 2 -  Partial thickness loss of dermis presenting as a shallow open injury with a red, pink wound bed without slough. (Active)  11/23/21   Location: Sacrum  Location Orientation: Right;Left  Staging: Stage 2 -  Partial thickness loss of dermis presenting as a shallow open injury with a red, pink wound bed without slough.  Wound Description (Comments):   Present on Admission: Yes  Dressing Type Foam - Lift dressing to assess site every shift 11/26/21 0958  I have Reviewed nursing notes, Vitals, pain scores, I/o's, Lab results and  imaging results since pt's last encounter, details please see discussion above  I ordered the following labs:   cbc/bmp/mag      DVT prophylaxis: SCD's Start: 11/25/21 0954   Code Status:   Code Status: DNR  Family Communication: will call wife Disposition:   Status is: Inpatient  Dispo: The patient is from: home               Anticipated d/c is to: SNF              Anticipated d/c date is: snf if cr improves, f/u on swallow study  Antimicrobials:   Anti-infectives (From admission, onward)    Start     Dose/Rate Route Frequency Ordered Stop   11/25/21 1300  azithromycin (ZITHROMAX) tablet 500 mg        500 mg Oral Daily 11/25/21 1210 11/28/21 0959   11/23/21 1515  cefTRIAXone (ROCEPHIN) 2 g in sodium chloride 0.9 % 100 mL IVPB        2 g 200 mL/hr over 30 Minutes Intravenous Every 24 hours 11/23/21 1500 11/28/21 1514   11/23/21 1515  azithromycin (ZITHROMAX) 500 mg in sodium chloride 0.9 % 250 mL IVPB  Status:  Discontinued        500 mg 250 mL/hr over 60 Minutes Intravenous Every 24 hours 11/23/21 1500 11/25/21 1210           Objective: Vitals:   11/26/21 0040 11/26/21 0245 11/26/21 0524 11/26/21 0736  BP: (!) 144/82 (!) 148/83 (!) 148/80 (!) 142/75  Pulse: 89 86 82   Resp: 20     Temp: 99.9 F (37.7 C) 99.2 F (37.3 C) 99 F (37.2 C) 99 F (37.2 C)  TempSrc: Oral Oral Oral Oral  SpO2: 92% 92% 92%   Weight:      Height:        Intake/Output Summary (Last 24 hours) at 11/26/2021 1900 Last data filed at 11/26/2021 1800 Gross per 24 hour  Intake 3 ml  Output 2680 ml  Net -2677 ml   Filed Weights   11/23/21 1201  Weight: 114.3 kg    Examination:  General exam: aaox3, appear very weak Respiratory system:  diminished LLL, no wheezing, no rales, respiratory effort normal. Cardiovascular system:  RRR.  Gastrointestinal system: Abdomen is nondistended, soft and nontender.  Normal bowel sounds heard. Central nervous system: Alert and orientedx3. No focal neurological deficits. Extremities:  bilateral pedal edema Skin: pressure injury as described  above Psychiatry: flat affect, no agitation    Data Reviewed: I have personally reviewed  labs and visualized  imaging studies since the last encounter and formulate the plan        Scheduled Meds:  allopurinol  150 mg Oral Daily   amLODipine  10 mg Oral Daily   aspirin EC  81 mg Oral Daily   atorvastatin  10 mg Oral QPM   azithromycin  500 mg Oral Daily   docusate sodium  100 mg Oral BID   dorzolamide-timolol  1 drop Left Eye BID   enoxaparin (LOVENOX) injection  55 mg Subcutaneous Q24H   feeding supplement  237 mL Oral BID BM   furosemide  40 mg Intravenous Once   gemfibrozil  600 mg Oral BID AC   insulin aspart  0-9 Units Subcutaneous TID WC   insulin glargine-yfgn  30 Units Subcutaneous BID   latanoprost  1 drop Both Eyes QHS  metoprolol succinate  25 mg Oral Daily   multivitamin with minerals  1 tablet Oral Daily   nystatin   Topical BID   sodium chloride flush  3 mL Intravenous Q12H   tadalafil  2.5 mg Oral Daily   Continuous Infusions:  cefTRIAXone (ROCEPHIN)  IV 2 g (11/26/21 1515)     LOS: 2 days     Florencia Reasons, MD PhD FACP [facility]  Available via Epic secure chat 7am-7pm for nonurgent issues Please page for urgent issues To page the attending provider between 7A-7P or the covering provider during after hours 7P-7A, please log into the web site [email or web address] and access using universal [place] password for that web site. If you do not have the password, please call the hospital operator.    11/26/2021, 7:00 PM

## 2021-11-26 NOTE — Progress Notes (Signed)
Occupational Therapy Treatment Patient Details Name: [name] MRN: [id number] DOB: November 08, 1940 Today's Date: 11/26/2021   History of present illness 7 male presenting to ED on 8/28 with generalized weakness and recurrent falls at home. Suspected infection on admission. PMH including DM; HTN; HLD; and RLS.   OT comments  Pt with limited progress towards OT goals due to "not feeling well" and reported back ("kidney") pain. Pt able to sit EOB with Mod A but declined to progress OOB today. Pt able to demo UB ADLs with Setup to Min A bed level with cues for sequencing and encouragement to attempts tasks prior to assistance being given. Continue to rec SNF rehab at DC as pt below functional baseline.    Recommendations for follow up therapy are one component of a multi-disciplinary discharge planning process, led by the attending physician.  Recommendations may be updated based on patient status, additional functional criteria and insurance authorization.    Follow Up Recommendations  Skilled nursing-short term rehab (<3 hours/day)    Assistance Recommended at Discharge Frequent or constant Supervision/Assistance  Patient can return home with the following  A lot of help with walking and/or transfers;A lot of help with bathing/dressing/bathroom   Equipment Recommendations  BSC/3in1    Recommendations for Other Services      Precautions / Restrictions Precautions Precautions: Fall Precaution Comments: monitor sats, HR Restrictions Weight Bearing Restrictions: No       Mobility Bed Mobility Overal bed mobility: Needs Assistance Bed Mobility: Supine to Sit, Sit to Supine     Supine to sit: Mod assist Sit to supine: Mod assist   General bed mobility comments: heavy assist for trunk and cues to roll onto side to attempt to minimize back discomfort. heavy assist needed to scoot hips and cue to bring LEs off. Pt with poor control of trunk back to bed with assist to lift LEs     Transfers                   General transfer comment: pt declined d/t back pain and not feeling well     Balance Overall balance assessment: Needs assistance Sitting-balance support: Feet supported, Bilateral upper extremity supported Sitting balance-Leahy Scale: Fair Sitting balance - Comments: though fairly reliant on UE support EOB                                   ADL either performed or assessed with clinical judgement   ADL Overall ADL's : Needs assistance/impaired     Grooming: Set up;Sitting;Bed level;Wash/dry face;Applying deodorant Grooming Details (indicate cue type and reason): bed level after reportedly feeling too bad to progress from EOB. encouragement to complete Upper Body Bathing: Minimal assistance;Sitting;Bed level Upper Body Bathing Details (indicate cue type and reason): cues to bathe under arms, for thoroughness                           General ADL Comments: Once EOB, pt reported feeling too bad to progress OOB and need to lay down - limited ADL/transfer progression today    Extremity/Trunk Assessment Upper Extremity Assessment Upper Extremity Assessment: Generalized weakness   Lower Extremity Assessment Lower Extremity Assessment: Defer to PT evaluation        Vision   Vision Assessment?: No apparent visual deficits   Perception     Praxis      Cognition Arousal/Alertness:  Awake/alert Behavior During Therapy: Flat affect Overall Cognitive Status: Impaired/Different from baseline Area of Impairment: Problem solving, Safety/judgement, Following commands, Attention                   Current Attention Level: Selective   Following Commands: Follows one step commands with increased time Safety/Judgement: Decreased awareness of safety, Decreased awareness of deficits   Problem Solving: Slow processing, Requires verbal cues, Requires tactile cues General Comments: benefits from cues for sequencing and  encouragement to participate        Exercises      Shoulder Instructions       General Comments VSS on RA    Pertinent Vitals/ Pain       Pain Assessment Pain Assessment: Faces Faces Pain Scale: Hurts little more Pain Location: back, "kidney" Pain Descriptors / Indicators: Grimacing, Guarding Pain Intervention(s): Monitored during session, Patient requesting pain meds-RN notified, Limited activity within patient's tolerance, Repositioned  Home Living                                          Prior Functioning/Environment              Frequency  Min 2X/week        Progress Toward Goals  OT Goals(current goals can now be found in the care plan section)  Progress towards OT goals: OT to reassess next treatment  Acute Rehab OT Goals Patient Stated Goal: lay back down OT Goal Formulation: With patient/family Time For Goal Achievement: 12/08/21 Potential to Achieve Goals: Good ADL Goals Pt Will Perform Upper Body Dressing: with supervision;sitting Pt Will Perform Lower Body Dressing: with min guard assist;sit to/from stand;with adaptive equipment Pt Will Transfer to Toilet: with min guard assist;ambulating;bedside commode Pt Will Perform Toileting - Clothing Manipulation and hygiene: with min guard assist;sitting/lateral leans;sit to/from stand  Plan Discharge plan remains appropriate    Co-evaluation                 AM-PAC OT "6 Clicks" Daily Activity     Outcome Measure   Help from another person eating meals?: A Little Help from another person taking care of personal grooming?: A Little Help from another person toileting, which includes using toliet, bedpan, or urinal?: A Lot Help from another person bathing (including washing, rinsing, drying)?: A Lot Help from another person to put on and taking off regular upper body clothing?: A Little Help from another person to put on and taking off regular lower body clothing?: A Lot 6  Click Score: 15    End of Session    OT Visit Diagnosis: Other abnormalities of gait and mobility (R26.89);Unsteadiness on feet (R26.81);Muscle weakness (generalized) (M62.81)   Activity Tolerance Patient limited by fatigue;Patient limited by pain   Patient Left in bed;with call bell/phone within reach;with bed alarm set;with nursing/sitter in room   Nurse Communication Mobility status (nurse present)        Time: 3220-2542 OT Time Calculation (min): 27 min  Charges: OT General Charges $OT Visit: 1 Visit OT Treatments $Self Care/Home Management : 8-22 mins $Therapeutic Activity: 8-22 mins  Malachy Chamber, OTR/L Acute Rehab Services Office: [phone]   Layla Maw 11/26/2021, 10:06 AM

## 2021-11-26 NOTE — Progress Notes (Signed)
Modified Barium Swallow Progress Note  Patient Details  Name: [name] MRN: [id number] Date of Birth: 1941-03-08  Today's Date: 11/26/2021  Modified Barium Swallow completed.  Full report located under Chart Review in the Imaging Section.  Brief recommendations include the following:  Clinical Impression  Patient presents with mild oral and mild-moderate pharyngeal phase dysphagia as per this MBS. During oral phase, he exhibited piecemeal swallowing with solids, decreased bolus cohesion with barium tablet and thin liquid barium and premature spillage of thin liquids into vallecular sinus. During pharyngeal phase, he exhibited swallow initiation delay to level of vallecular sinus with puree and regular solids and mild amount of residuals remaining in vallecular sinus post initial swallow. She exhibited penetration above vocal cords with nectar thick and thin liquids and exhibited silent aspiration with thin liquids when PO's still in pharynx and when tilting head back to try to swallow thin liquids with barium tablet. He exhibited silent aspiration of nectar thick liquids when taking a sip via a straw. He did not demonstrate or endorse any sensation to vallecular sinus residuals. He exhibited an instance of retrograde movement of thin liquid bolus through cervical esophagus. In addition, presence of prominent cricopharyngeal bar was observed. SLP recommending Dys 3 (mechanical soft) solids and nectar thick liquids, no straws and with pills taken with puree. SLP will follow patient for toleration while here in hospital and he will benefit from SNF level SLP as well.   Swallow Evaluation Recommendations       SLP Diet Recommendations: Dysphagia 3 (Mech soft) solids;Nectar thick liquid   Liquid Administration via: Cup;No straw   Medication Administration: Whole meds with puree   Supervision: Patient able to self feed;Full supervision/cueing for compensatory strategies   Compensations:  Minimize environmental distractions;Slow rate;Small sips/bites   Postural Changes: Seated upright at 90 degrees   Oral Care Recommendations: Oral care BID       Sonia Baller, MA, CCC-SLP Speech Therapy

## 2021-11-26 NOTE — TOC Progression Note (Addendum)
Transition of Care [facility]) - Progression Note    Patient Details  Name: [name] MRN: [id number] Date of Birth: 03/06/1941  Transition of Care [facility]) CM/SW Contact  Joanne Chars, LCSW Phone Number: 11/26/2021, 11:19 AM  Clinical Narrative:   Bed offers presented to pt wife.  She would like response from [facility].  CSW reached out to Tracy/[facility].  Wife also requesting help with ongoing issues with urine incontinence.  MD informed.    1145: [facility] does make bed offer, wife informed and does want to accept.    Auth request for SNF called in to [facility].  Expected Discharge Plan: [place] Barriers to Discharge: Continued Medical Work up, SNF Pending bed offer  Expected Discharge Plan and Services Expected Discharge Plan: [place] In-house Referral: Clinical Social Work   Post Acute Care Choice: [place] Living arrangements for the past 2 months: Single Family Home                                       Social Determinants of Health (SDOH) Interventions    Readmission Risk Interventions     No data to display

## 2021-11-26 NOTE — Progress Notes (Signed)
PT Cancellation Note  Patient Details Name: [name] MRN: [id number] DOB: [birth year]   Cancelled Treatment:    Reason Eval/Treat Not Completed: Fatigue/lethargy limiting ability to participate;Patient declined, no reason specified;Other (comment) (Pt refused x 2 in afternoon today despite encouragement. Pt reports he will try to participate tomorrow.)  Donna Bernard, PT   Donna Bernard 11/26/2021, 3:08 PM

## 2021-11-27 DIAGNOSIS — R531 Weakness: Secondary | ICD-10-CM | POA: Diagnosis not present

## 2021-11-27 LAB — CBC
HCT: 46.5 % (ref 39.0–52.0)
Hemoglobin: 15.6 g/dL (ref 13.0–17.0)
MCH: 29.5 pg (ref 26.0–34.0)
MCHC: 33.5 g/dL (ref 30.0–36.0)
MCV: 87.9 fL (ref 80.0–100.0)
Platelets: 310 10*3/uL (ref 150–400)
RBC: 5.29 MIL/uL (ref 4.22–5.81)
RDW: 15.2 % (ref 11.5–15.5)
WBC: 10.9 10*3/uL — ABNORMAL HIGH (ref 4.0–10.5)
nRBC: 0 % (ref 0.0–0.2)

## 2021-11-27 LAB — BASIC METABOLIC PANEL
Anion gap: 11 (ref 5–15)
BUN: 23 mg/dL (ref 8–23)
CO2: 23 mmol/L (ref 22–32)
Calcium: 8.8 mg/dL — ABNORMAL LOW (ref 8.9–10.3)
Chloride: 101 mmol/L (ref 98–111)
Creatinine, Ser: 1.2 mg/dL (ref 0.61–1.24)
GFR, Estimated: >60 mL/min (ref >60)
Glucose, Bld: 229 mg/dL — ABNORMAL HIGH (ref 70–99)
Potassium: 3.6 mmol/L (ref 3.5–5.1)
Sodium: 135 mmol/L (ref 135–145)

## 2021-11-27 LAB — MAGNESIUM: Magnesium: 1.9 mg/dL (ref 1.7–2.4)

## 2021-11-27 LAB — GLUCOSE, CAPILLARY
Glucose-Capillary: 205 mg/dL — ABNORMAL HIGH (ref 70–99)
Glucose-Capillary: 226 mg/dL — ABNORMAL HIGH (ref 70–99)
Glucose-Capillary: 236 mg/dL — ABNORMAL HIGH (ref 70–99)
Glucose-Capillary: 246 mg/dL — ABNORMAL HIGH (ref 70–99)
Glucose-Capillary: 268 mg/dL — ABNORMAL HIGH (ref 70–99)
Glucose-Capillary: 343 mg/dL — ABNORMAL HIGH (ref 70–99)
Glucose-Capillary: 390 mg/dL — ABNORMAL HIGH (ref 70–99)

## 2021-11-27 MED ORDER — FUROSEMIDE 10 MG/ML IJ SOLN
40.0000 mg | Freq: Once | INTRAMUSCULAR | Status: AC
  Administered 2021-11-27: 40 mg via INTRAVENOUS
  Filled 2021-11-27: qty 4

## 2021-11-27 MED ORDER — POTASSIUM CHLORIDE 20 MEQ PO PACK
40.0000 meq | PACK | Freq: Once | ORAL | Status: AC
  Administered 2021-11-27: 40 meq via ORAL
  Filled 2021-11-27: qty 2

## 2021-11-27 MED ORDER — TAMSULOSIN HCL 0.4 MG PO CAPS
0.4000 mg | ORAL_CAPSULE | Freq: Every day | ORAL | Status: DC
  Administered 2021-11-27: 0.4 mg via ORAL
  Filled 2021-11-27: qty 1

## 2021-11-27 MED ORDER — INSULIN GLARGINE-YFGN 100 UNIT/ML ~~LOC~~ SOLN
35.0000 [IU] | Freq: Two times a day (BID) | SUBCUTANEOUS | Status: DC
Start: 2021-11-27 — End: 2021-11-28
  Administered 2021-11-27: 35 [IU] via SUBCUTANEOUS
  Filled 2021-11-27 (×3): qty 0.35

## 2021-11-27 NOTE — TOC Progression Note (Addendum)
Transition of Care [facility]) - Progression Note    Patient Details  Name: [name] MRN: [id number] Date of Birth: Jan 25, 1941  Transition of Care [facility]) CM/SW Contact  Joanne Chars, LCSW Phone Number: 11/27/2021, 8:29 AM  Clinical Narrative:  [facility] does not approve SNF request, but does approve ambulance request: [phone].  Peer to peer offered with Dr Amalia Hailey, [phone], by end of day today.  MD informed.    1530: Connie/[facility] calls, SNF approved: [phone].  Pt and wife informed. MD aware.  Expected Discharge Plan: [place] Barriers to Discharge: Continued Medical Work up, SNF Pending bed offer  Expected Discharge Plan and Services Expected Discharge Plan: [place] In-house Referral: Clinical Social Work   Post Acute Care Choice: [place] Living arrangements for the past 2 months: Single Family Home                                       Social Determinants of Health (SDOH) Interventions    Readmission Risk Interventions     No data to display

## 2021-11-27 NOTE — Progress Notes (Signed)
Physical Therapy Treatment Patient Details Name: [name] MRN: [id number] DOB: 02/17/1941 Today's Date: 11/27/2021   History of Present Illness 47 male presenting to ED on 8/28 with generalized weakness and recurrent falls at home. Suspected infection on admission. PMH including DM; HTN; HLD; and RLS.    PT Comments    Patient agreeable to participate with therapy this date. Patient required modA for bed mobility and sit to stand transfer with use of RW. Able to begin ambulation with use of RW and minA. Distance limited due to incontinence and need for sitting to clean patient up. Patient stating "I want to get better and be able to do things". Discussed with patient about need for brief for mobility to increase distance at this time. Continue to recommend SNF for ongoing Physical Therapy.       Recommendations for follow up therapy are one component of a multi-disciplinary discharge planning process, led by the attending physician.  Recommendations may be updated based on patient status, additional functional criteria and insurance authorization.  Follow Up Recommendations  Skilled nursing-short term rehab (<3 hours/day) Can patient physically be transported by private vehicle: No   Assistance Recommended at Discharge Frequent or constant Supervision/Assistance  Patient can return home with the following A lot of help with walking and/or transfers;A lot of help with bathing/dressing/bathroom;Direct supervision/assist for medications management;Assist for transportation;Help with stairs or ramp for entrance;Direct supervision/assist for financial management   Equipment Recommendations  None recommended by PT    Recommendations for Other Services       Precautions / Restrictions Precautions Precautions: Fall Precaution Comments: monitor sats, HR Restrictions Weight Bearing Restrictions: No     Mobility  Bed Mobility Overal bed mobility: Needs Assistance Bed Mobility:  Supine to Sit     Supine to sit: Mod assist     General bed mobility comments: modA for bringing LEs closer to EOB and trunk elevation    Transfers Overall transfer level: Needs assistance Equipment used: Rolling Mieka Leaton (2 wheels) Transfers: Sit to/from Stand Sit to Stand: Mod assist           General transfer comment: modA to stand from EOB with cues for hand placement    Ambulation/Gait Ambulation/Gait assistance: Min assist Gait Distance (Feet): 8 Feet Assistive device: Rolling Miklo Aken (2 wheels) Gait Pattern/deviations: Step-to pattern, Decreased stride length Gait velocity: decreased     General Gait Details: very short steps and patient needing cues for sequencing of RW. Incontinent after short distance requiring need to sit and perform clean up. Assist for balance and RW management   Stairs             Wheelchair Mobility    Modified Rankin (Stroke Patients Only)       Balance Overall balance assessment: Needs assistance Sitting-balance support: Feet supported, Bilateral upper extremity supported Sitting balance-Leahy Scale: Fair     Standing balance support: Bilateral upper extremity supported, Reliant on assistive device for balance Standing balance-Leahy Scale: Poor                              Cognition Arousal/Alertness: Awake/alert Behavior During Therapy: Flat affect Overall Cognitive Status: Impaired/Different from baseline Area of Impairment: Problem solving, Safety/judgement, Following commands, Attention                   Current Attention Level: Selective   Following Commands: Follows one step commands with increased time Safety/Judgement: Decreased awareness of  safety, Decreased awareness of deficits   Problem Solving: Slow processing, Requires verbal cues, Requires tactile cues          Exercises      General Comments General comments (skin integrity, edema, etc.): VSS      Pertinent Vitals/Pain  Pain Assessment Pain Assessment: Faces Faces Pain Scale: Hurts little more Pain Location: gas pain Pain Descriptors / Indicators: Grimacing, Guarding Pain Intervention(s): Monitored during session, Repositioned    Home Living                          Prior Function            PT Goals (current goals can now be found in the care plan section) Acute Rehab PT Goals Patient Stated Goal: none stated PT Goal Formulation: With patient Time For Goal Achievement: 12/08/21 Potential to Achieve Goals: Good Progress towards PT goals: Progressing toward goals    Frequency    Min 3X/week      PT Plan Current plan remains appropriate    Co-evaluation              AM-PAC PT "6 Clicks" Mobility   Outcome Measure  Help needed turning from your back to your side while in a flat bed without using bedrails?: A Little Help needed moving from lying on your back to sitting on the side of a flat bed without using bedrails?: A Lot Help needed moving to and from a bed to a chair (including a wheelchair)?: A Lot Help needed standing up from a chair using your arms (e.g., wheelchair or bedside chair)?: A Lot Help needed to walk in hospital room?: A Lot Help needed climbing 3-5 steps with a railing? : Total 6 Click Score: 12    End of Session Equipment Utilized During Treatment: Gait belt Activity Tolerance: Patient limited by fatigue Patient left: in chair;with call bell/phone within reach;with chair alarm set Nurse Communication: Mobility status PT Visit Diagnosis: Unsteadiness on feet (R26.81);Muscle weakness (generalized) (M62.81);Difficulty in walking, not elsewhere classified (R26.2);Pain Pain - Right/Left: Left Pain - part of body: Leg;Knee;Hip     Time: 0093-8182 PT Time Calculation (min) (ACUTE ONLY): 35 min  Charges:  $Gait Training: 8-22 mins $Therapeutic Activity: 8-22 mins                     Chardonnay Holzmann A. Gilford Rile PT, DPT Acute Rehabilitation  Services Office [phone]    Linna Hoff 11/27/2021, 4:01 PM

## 2021-11-27 NOTE — Progress Notes (Signed)
Speech Language Pathology Treatment: Dysphagia  Patient Details Name: [name] MRN: [id number] DOB: 10/10/1940 Today's Date: 11/27/2021 Time: 6195-0932 SLP Time Calculation (min) (ACUTE ONLY): 15 min  Assessment / Plan / Recommendation Clinical Impression  Patient seen by SLP for skilled treatment focused on dysphagia goals. He was sitting up in recliner, awake and alert. He reported his appetite is "so-so" and that he really doesn't like the food they serve for breakfast. SLP then wrote down items he would like (oatmeal, grits) and RN said he would help order meals. Patient requested a diet cola and he did not seem to notice after SLP had thickened it to nectar thick consistency. He drank cup sips without observed s/s aspiration or penetration. SLP recommending to continue with Dys 3 solids, nectar thick liquids and will continue to follow for toleration and ability to upgrade. s  HPI HPI: Pt is a 81 y.o. male who presented with generalized weakness, has been falling. CXR (11/23/21) revealed "Minimal left basilar atelectasis/airspace disease". Per MD note (8/28) there are concerns for possible PNA. PMH: bells palsy (x2, on each side) DM, HTN, HLD and RLS.      SLP Plan  Continue with current plan of care      Recommendations for follow up therapy are one component of a multi-disciplinary discharge planning process, led by the attending physician.  Recommendations may be updated based on patient status, additional functional criteria and insurance authorization.    Recommendations  Diet recommendations: Nectar-thick liquid;Dysphagia 3 (mechanical soft) Liquids provided via: Cup;No straw Medication Administration: Whole meds with puree Supervision: Patient able to self feed;Intermittent supervision to cue for compensatory strategies Compensations: Minimize environmental distractions;Slow rate;Small sips/bites Postural Changes and/or Swallow Maneuvers: Seated upright 90 degrees                 Oral Care Recommendations: Oral care BID Follow Up Recommendations: Skilled nursing-short term rehab (<3 hours/day) Assistance recommended at discharge: Frequent or constant Supervision/Assistance SLP Visit Diagnosis: Dysphagia, unspecified (R13.10) Plan: Continue with current plan of care           Sonia Baller, MA, CCC-SLP Speech Therapy

## 2021-11-27 NOTE — Progress Notes (Addendum)
PROGRESS NOTE    [name]  MHD:[id number] DOB: September 09, 1940 DOA: 11/23/2021 PCP: Tonia Ghent, MD     Brief Narrative:  81 y.o.m w/ history significant of DM; HTN; HLD; and RLS presented with generalized weakness, has been falling, had a Friday night and 8/27 am, has been very weak, not light-headed or confused. He also fell about 3 weeks ago, said he missed the chair. , He has not had LOC. Wife reports a very foul urinary odor, no real urinary symptoms In QJ:JHERDEYCXKG, positive for cough,CT nondistorted shows multiple nonobstructing protractedly stone,bibasilar airspace consolidation with trace left pleural effusion, suspicious for multifocal pneumonia or aspiration.He RBC 20-50, WBC 20-50, LE +, Nitrite neg. lactic acid 3.1.resp panel neg.Urine and blood culture sent. Patient admitted for generalized weakness recurrent falls, work-up not consistent with infection sepsis ruled out.  Patient started on IV fluids, repeat labs lactic acid 1.3, wbc down to 12.2, creat 1.5 Patient seen by PT OT advised skilled nursing facility, changed to DNR by palliative care.  Dehydration AKI resolved WC count normalized   Subjective:  Blood glucose elevated, increase insulin Great urine output after lasix, he thinks he is breathing better, pedal edema went down some Cr improved He denies pain, he is on room air Family at bedside   Assessment & Plan:  Principal Problem:   Generalized weakness Active Problems:   HTN (hypertension)   Skin irritation   Pressure ulcer, buttock   Benign prostatic hyperplasia with urinary obstruction   Dyslipidemia   Type 2 diabetes mellitus with stage 3a chronic kidney disease, with long-term current use of insulin (HCC)   Glaucoma   Failure to thrive in adult   FTT -Presented to the hospital with weakness,  increased falls, he is eager to participate with therapy to get stronger -seen by palliative care, PT/OT, plan to discharge to snf with palliative  care continue to follow  Leukocytosis, aspiration pneumonia, UTI CT renal stone bibasilar airspace consolidation noted,  Received abx , leukocytosis resolved Seen by speech, currently on soft diet/nectar thick liquid Appear volume overloaded, feeling better with IV Lasix '40mg'$  on 8/30, will give another dose today, reassess in the morning  AKI vs CKD II/III Cr  appear improving PVR after voiding 700cc is 144cc, he denies bladder feeling full, denies urinary symptom,  wife states he wears depends at home Monitor volume, urine output  Hypokalemia Continue to Replace, recheck in am  Insulin-dependent type 2 diabetes, with hyperglycemia Increase long-acting insulin Repeat BMP in the morning   HTN HLD Stable on current meds  BPH: Continue tadafil.  Having urinary incontinence, no control on urinary stream, monitor bladder scan Start flomax   Glaucoma continues Cosopt and latanoprost     Nutritional Assessment: The patient's BMI is: Body mass index is 34.18 kg/m.[name] Seen by dietician.  I agree with the assessment and plan as outlined below: Nutrition Status: Nutrition Problem: Increased nutrient needs Etiology: acute illness Signs/Symptoms: estimated needs Interventions: Ensure Enlive (each supplement provides 350kcal and 20 grams of protein), MVI, Liberalize Diet  .    Skin Assessment: I have examined the patient's skin and I agree with the wound assessment as performed by the wound care RN as outlined below: Pressure Injury 11/23/21 Sacrum Right;Left Stage 2 -  Partial thickness loss of dermis presenting as a shallow open injury with a red, pink wound bed without slough. (Active)  11/23/21   Location: Sacrum  Location Orientation: Right;Left  Staging: Stage 2 -  Partial thickness  loss of dermis presenting as a shallow open injury with a red, pink wound bed without slough.  Wound Description (Comments):   Present on Admission: Yes  Dressing Type Foam - Lift dressing to  assess site every shift 11/26/21 2000     I have Reviewed nursing notes, Vitals, pain scores, I/o's, Lab results and  imaging results since pt's last encounter, details please see discussion above  I ordered the following labs:  cbc/bmp/mag      DVT prophylaxis: SCD's Start: 11/25/21 0954   Code Status:   Code Status: DNR  Family Communication:  wife at bedside  Disposition:   Status is: Inpatient  Dispo: The patient is from: home               Anticipated d/c is to: SNF, approved for snf placement after peer to peer review on 8/31 with Dr Amalia Hailey               Anticipated d/c date is: snf when there is a bed  Antimicrobials:   Anti-infectives (From admission, onward)    Start     Dose/Rate Route Frequency Ordered Stop   11/25/21 1300  azithromycin (ZITHROMAX) tablet 500 mg        500 mg Oral Daily 11/25/21 1210 11/27/21 0902   11/23/21 1515  cefTRIAXone (ROCEPHIN) 2 g in sodium chloride 0.9 % 100 mL IVPB        2 g 200 mL/hr over 30 Minutes Intravenous Every 24 hours 11/23/21 1500 11/27/21 1458   11/23/21 1515  azithromycin (ZITHROMAX) 500 mg in sodium chloride 0.9 % 250 mL IVPB  Status:  Discontinued        500 mg 250 mL/hr over 60 Minutes Intravenous Every 24 hours 11/23/21 1500 11/25/21 1210           Objective: Vitals:   11/26/21 2000 11/27/21 0500 11/27/21 0739 11/27/21 1450  BP: (!) 153/82 (!) 152/87 (!) 158/98   Pulse: 97 88 91   Resp: (!) 22 (!) 23 (!) 23   Temp: 99.6 F (37.6 C) 98.7 F (37.1 C) 98.8 F (37.1 C) 98.4 F (36.9 C)  TempSrc: Oral Oral Oral Oral  SpO2: 92% 96% 93%   Weight:      Height:        Intake/Output Summary (Last 24 hours) at 11/27/2021 1502 Last data filed at 11/27/2021 3532 Gross per 24 hour  Intake 203 ml  Output 2380 ml  Net -2177 ml   Filed Weights   11/23/21 1201  Weight: 114.3 kg    Examination:  General exam: aaox3, appear very weak Respiratory system:  diminished LLL, no wheezing, no rales, respiratory  effort normal. Cardiovascular system:  RRR.  Gastrointestinal system: Abdomen is nondistended, soft and nontender.  Normal bowel sounds heard. Central nervous system: Alert and orientedx3. No focal neurological deficits. Extremities:  bilateral pedal edema Skin: pressure injury as described above Psychiatry: flat affect, no agitation    Data Reviewed: I have personally reviewed  labs and visualized  imaging studies since the last encounter and formulate the plan        Scheduled Meds:  allopurinol  150 mg Oral Daily   amLODipine  10 mg Oral Daily   aspirin EC  81 mg Oral Daily   atorvastatin  10 mg Oral QPM   docusate sodium  100 mg Oral BID   dorzolamide-timolol  1 drop Left Eye BID   enoxaparin (LOVENOX) injection  55 mg Subcutaneous Q24H  feeding supplement  237 mL Oral BID BM   gemfibrozil  600 mg Oral BID AC   insulin aspart  0-9 Units Subcutaneous TID WC   insulin glargine-yfgn  35 Units Subcutaneous BID   latanoprost  1 drop Both Eyes QHS   metoprolol succinate  25 mg Oral Daily   multivitamin with minerals  1 tablet Oral Daily   nystatin   Topical BID   sodium chloride flush  3 mL Intravenous Q12H   tadalafil  2.5 mg Oral Daily   tamsulosin  0.4 mg Oral QPC supper   Continuous Infusions:     LOS: 3 days     Florencia Reasons, MD PhD FACP [facility]  Available via Epic secure chat 7am-7pm for nonurgent issues Please page for urgent issues To page the attending provider between 7A-7P or the covering provider during after hours 7P-7A, please log into the web site [email or web address] and access using universal [place] password for that web site. If you do not have the password, please call the hospital operator.    11/27/2021, 3:02 PM

## 2021-11-27 NOTE — Inpatient Diabetes Management (Signed)
Inpatient Diabetes Program Recommendations  AACE/ADA: New Consensus Statement on Inpatient Glycemic Control (2015)  Target Ranges:  Prepandial:   less than 140 mg/dL      Peak postprandial:   less than 180 mg/dL (1-2 hours)      Critically ill patients:  140 - 180 mg/dL   Lab Results  Component Value Date   GLUCAP 226 (H) 11/27/2021   HGBA1C 7.7 (A) 10/01/2021    Review of Glycemic Control  Latest Reference Range & Units 11/27/21 00:00 11/27/21 04:07 11/27/21 07:32  Glucose-Capillary 70 - 99 mg/dL 236 (H) 205 (H) 226 (H)  (H): Data is abnormally high   Diabetes history: DM 2 Outpatient Diabetes medications: Farxiga 10 mg Daily, Trulicity 4.5 mg weekly, Novolog 10 units tid, Toujeo 220 units Qpm  Current orders:   Semglee 30 units BID Novolog 0-9 units tid with meals   Inpatient Diabetes Program Recommendations:    Recommend changing Semglee to 35 units bid.    Thanks, Bronson Curb, MSN, RNC-OB Diabetes Coordinator [phone] (8a-5p)

## 2021-11-28 ENCOUNTER — Other Ambulatory Visit: Payer: Self-pay | Admitting: Internal Medicine

## 2021-11-28 DIAGNOSIS — R531 Weakness: Secondary | ICD-10-CM | POA: Diagnosis not present

## 2021-11-28 LAB — CBC
HCT: 44.1 % (ref 39.0–52.0)
Hemoglobin: 15.5 g/dL (ref 13.0–17.0)
MCH: 31.1 pg (ref 26.0–34.0)
MCHC: 35.1 g/dL (ref 30.0–36.0)
MCV: 88.4 fL (ref 80.0–100.0)
Platelets: 349 10*3/uL (ref 150–400)
RBC: 4.99 MIL/uL (ref 4.22–5.81)
RDW: 15.9 % — ABNORMAL HIGH (ref 11.5–15.5)
WBC: 10.3 10*3/uL (ref 4.0–10.5)
nRBC: 0 % (ref 0.0–0.2)

## 2021-11-28 LAB — BASIC METABOLIC PANEL
Anion gap: 10 (ref 5–15)
BUN: 30 mg/dL — ABNORMAL HIGH (ref 8–23)
CO2: 27 mmol/L (ref 22–32)
Calcium: 8.9 mg/dL (ref 8.9–10.3)
Chloride: 97 mmol/L — ABNORMAL LOW (ref 98–111)
Creatinine, Ser: 1.45 mg/dL — ABNORMAL HIGH (ref 0.61–1.24)
GFR, Estimated: 49 mL/min — ABNORMAL LOW (ref >60)
Glucose, Bld: 245 mg/dL — ABNORMAL HIGH (ref 70–99)
Potassium: 3.7 mmol/L (ref 3.5–5.1)
Sodium: 134 mmol/L — ABNORMAL LOW (ref 135–145)

## 2021-11-28 LAB — GLUCOSE, CAPILLARY
Glucose-Capillary: 249 mg/dL — ABNORMAL HIGH (ref 70–99)
Glucose-Capillary: 223 mg/dL — ABNORMAL HIGH (ref 70–99)

## 2021-11-28 LAB — LEGIONELLA PNEUMOPHILA SEROGP 1 UR AG: L. pneumophila Serogp 1 Ur Ag: POSITIVE — AB

## 2021-11-28 LAB — CULTURE, BLOOD (ROUTINE X 2): Culture: NO GROWTH

## 2021-11-28 MED ORDER — NYSTATIN 100000 UNIT/GM EX POWD: Freq: Two times a day (BID) | CUTANEOUS | 0 refills | Status: AC

## 2021-11-28 MED ORDER — TAMSULOSIN HCL 0.4 MG PO CAPS: 0.4000 mg | ORAL_CAPSULE | Freq: Every day | ORAL | Status: AC

## 2021-11-28 MED ORDER — LEVOFLOXACIN 500 MG PO TABS: 500.0000 mg | ORAL_TABLET | Freq: Every day | ORAL | 0 refills | Status: AC

## 2021-11-28 MED ORDER — TOUJEO MAX SOLOSTAR 300 UNIT/ML ~~LOC~~ SOPN: 100.0000 [IU] | PEN_INJECTOR | Freq: Every day | SUBCUTANEOUS | 3 refills | Status: AC

## 2021-11-28 MED ORDER — POLYETHYLENE GLYCOL 3350 17 G PO PACK: 17.0000 g | PACK | Freq: Every day | ORAL | 0 refills | Status: AC | PRN

## 2021-11-28 MED ORDER — INSULIN GLARGINE-YFGN 100 UNIT/ML ~~LOC~~ SOLN
40.0000 [IU] | Freq: Two times a day (BID) | SUBCUTANEOUS | Status: DC
Start: 2021-11-28 — End: 2021-11-28
  Administered 2021-11-28: 40 [IU] via SUBCUTANEOUS
  Filled 2021-11-28 (×3): qty 0.4

## 2021-11-28 NOTE — Discharge Summary (Addendum)
Discharge Summary  [name] ZOX:[id number] DOB: 25-Apr-1940  PCP: Tonia Ghent, MD  Admit date: 11/23/2021 Discharge date: 11/28/2021 Time spent: 61mns, more than 50% time spent on coordination of care.   Recommendations for Outpatient Follow-up:  F/u with SNF MD for hospital discharge follow up, repeat cbc/bmp at follow up. Palliative care to continue follow patient at SNF and after discharge from SNF     Discharge Diagnoses:  Active Hospital Problems   Diagnosis Date Noted   Generalized weakness 11/23/2021   Failure to thrive in adult 11/24/2021   Glaucoma 11/23/2021   Type 2 diabetes mellitus with stage 3a chronic kidney disease, with long-term current use of insulin ([facility] 05/23/2021   Dyslipidemia 04/11/2021   Pressure ulcer, buttock 04/17/2019   Skin irritation 03/29/2019   HTN (hypertension) 06/11/2017   Benign prostatic hyperplasia with urinary obstruction 07/14/2012    Resolved Hospital Problems   Diagnosis Date Noted Date Resolved   Multifocal pneumonia 11/23/2021 11/23/2021   Hyperlipemia  11/23/2021    Discharge Condition: stable  Diet recommendation: heart healthy/carb modified Dysphagia 3/nectar thick liquid Aspiration precaution [facility]   11/23/21 1201  Weight: 114.3 kg    History of present illness: ( per admitting MD Dr YLorin [facility] Chief Complaint: Weakness   HPI: TARLIND KLINGERMANis a 81y.o. male with medical history significant of DM; HTN; HLD; and RLS presenting with generalized weakness.  He has been falling - fell Friday night and this AM.  He has been very weak, not light-headed or confused.  Thursday, all was well.  He got up to go to the bathroom Friday and he fell.  He said he had been laying there about 30 minutes.  He also fell about 3 weeks ago, said he missed the chair.  This AM< he slid out of his lift chair and was sitting on the floor in urine and feces.  His wife spoke to a nurse  at the PCP and since his head felt clammy, she recommended that they call 911. He has not actually passed out, just feels too weak to stand.  No fever.  His wife heard rattling in his chest yesterday.  No SOB.  His wife reports a very foul urinary odor ([employer] also suggested this might suggest a UTI).  No real urinary symptoms.  He reports that he is eating and drinking well.  No sick contacts, does not have any known COVID contacts.  He is very sedentary, uses lift chair but mostly sits all day and night.       ER Course:  Appears to have PNA and UTI.  3 falls in 3 weeks.  Also incontinence.  +cough, SOB for a couple of days, weakness worse.  CT with multifocal PNA, UA also abnormal.  CBC elevated.  No hypoxia, shock.  Given antibiotics, treated for early sepsis.    Hospital Course:  Principal Problem:   Generalized weakness Active Problems:   HTN (hypertension)   Skin irritation   Pressure ulcer, buttock   Benign prostatic hyperplasia with urinary obstruction   Dyslipidemia   Type 2 diabetes mellitus with stage 3a chronic kidney disease, with long-term current use of insulin (HCC)   Glaucoma   Failure to thrive in adult    FTT -Presented to the hospital with weakness,  increased falls, he is eager to participate with therapy to get stronger -seen by palliative care, PT/OT, discharge to snf with  palliative care continue to follow   Leukocytosis, aspiration pneumonia, UTI CT renal stone showed multiple nonobstructing right renal stones, also showed  bibasilar airspace consolidation.  Received abx , leukocytosis resolved, denies pain, no cough, no hypoxia Seen by speech, currently on soft diet/nectar thick liquid Appear volume overloaded, feeling better with IV Lasix 109m qd on 8/30 and 8/31, volume improved May need lasix prn for edema moving forward   Addendum after discharge: Lab called to report urine legionella testing result returned after he was discharged.  he is tested +.  he received zithromax /rocephin in the hospital, new prescription of  levaquin sent to SNF.    AKI vs CKD II/III Cr  appear improving PVR after voiding 700cc is 144cc, he denies bladder feeling full, denies urinary symptom,  wife states he wears depends at home Monitor volume, urine output   Hypokalemia Replaced, improved    Insulin-dependent type 2 diabetes, with hyperglycemia Increase long-acting insulin     HTN HLD Stable on current meds   BPH: Continue tadafil.  Having urinary incontinence, no control on urinary stream, monitor bladder scan Start flomax   Glaucoma continues Cosopt and latanoprost       Nutritional Assessment: The patient's BMI is: Body mass index is 34.18 kg/m..Marland KitchenSeen by dietician.  I agree with the assessment and plan as outlined below: Nutrition Status: Nutrition Problem: Increased nutrient needs Etiology: acute illness Signs/Symptoms: estimated needs Interventions: Ensure Enlive (each supplement provides 350kcal and 20 grams of protein), MVI, Liberalize Diet   .       Skin Assessment: I have examined the patient's skin and I agree with the wound assessment as performed by the wound care RN as outlined below:     Pressure Injury 11/23/21 Sacrum Right;Left Stage 2 -  Partial thickness loss of dermis presenting as a shallow open injury with a red, pink wound bed without slough. (Active)  11/23/21   Location: Sacrum  Location Orientation: Right;Left  Staging: Stage 2 -  Partial thickness loss of dermis presenting as a shallow open injury with a red, pink wound bed without slough.  Wound Description (Comments):   Present on Admission: Yes  Dressing Type Foam - Lift dressing to assess site every shift 11/26/21 2000        Discharge Exam: BP 132/88 (BP Location: Right Arm)   Pulse 89   Temp 97.9 F (36.6 C) (Oral)   Resp 19   Ht 6' (1.829 m)   Wt 114.3 kg   SpO2 96%   BMI 34.18 kg/m   General: frail, but NAD, aaox3 Cardiovascular:  RRR Respiratory: normal respiratory effort     Discharge Instructions     Diet Carb Modified   Complete by: As directed    Dysphagia diet, nectar thick liquid Continue speech therapy,  Continue aspiration precaution   Discharge wound care:   Complete by: As directed    Pressure off loading Cleanse peineal skin with soap and water and pat dry, apply nystatin  Cleanse buttocks wounds with soap and water and pat dry. Apply alginate to wound bed and cover gluteal folds with silicone foam. Change daily and prn soilage.   Increase activity slowly   Complete by: As directed       Allergies as of 11/28/2021       Reactions   Morphine And Related Nausea And Vomiting   Causes nausea & vomiting   Codeine Sulfate Other (See Comments)   intolerant   Metformin And Related Other (  See Comments)   Gi intolerance, not an allergy        Medication List     STOP taking these medications    amLODipine 10 MG tablet Commonly known as: NORVASC   Citrucel oral powder Generic drug: methylcellulose   meloxicam 15 MG tablet Commonly known as: MOBIC   miconazole 2 % cream Commonly known as: Micatin   mupirocin ointment 2 % Commonly known as: BACTROBAN       TAKE these medications    allopurinol 300 MG tablet Commonly known as: ZYLOPRIM TAKE 1/2 TABLET BY MOUTH EVERY MORNING What changed: when to take this   aspirin 81 MG tablet Take 81 mg by mouth daily.   atorvastatin 10 MG tablet Commonly known as: LIPITOR TAKE 1 TABLET BY MOUTH EACH EVENING What changed:  how much to take how to take this when to take this additional instructions   blood glucose meter kit and supplies Dispense based on patient and insurance preference. Use up to four times daily as directed. (FOR ICD-10 E10.9, E11.9).   dapagliflozin propanediol 10 MG Tabs tablet Commonly known as: Farxiga Take 1 tablet (10 mg total) by mouth daily before breakfast.   diclofenac sodium 1 % Gel Commonly known  as: VOLTAREN Apply 2 g topically daily as needed (For pain).   dorzolamide-timolol 22.3-6.8 MG/ML ophthalmic solution Commonly known as: COSOPT Place 1 drop into the left eye 2 (two) times daily.   FreeStyle Libre 2 Reader Kerrin Mo USE TO CHECK BLOOD SUGAR DAILY   FreeStyle Libre 2 Sensor Misc USE AS DIRECTED BY PROVIDER   gemfibrozil 600 MG tablet Commonly known as: LOPID TAKE 1 TABLET BY MOUTH EVERY MORNING AND1 IN THE EVENING What changed: See the new instructions.   glucose blood test strip Use as instructed to check blood glucose twice daily   insulin aspart 100 UNIT/ML FlexPen Commonly known as: NOVOLOG 10 units with meals What changed:  how much to take how to take this when to take this additional instructions   Insulin Pen Needle 31G X 5 MM Misc 1 Device by Does not apply route in the morning, at noon, in the evening, and at bedtime.   latanoprost 0.005 % ophthalmic solution Commonly known as: XALATAN Place 1 drop into both eyes at bedtime.   losartan 100 MG tablet Commonly known as: COZAAR TAKE 1 TABLET BY MOUTH DAILY What changed: how much to take   metoprolol succinate 25 MG 24 hr tablet Commonly known as: TOPROL-XL TAKE 1 TABLET BY MOUTH DAILY   nystatin powder Commonly known as: MYCOSTATIN/NYSTOP Apply topically 2 (two) times daily.   polyethylene glycol 17 g packet Commonly known as: MIRALAX / GLYCOLAX Take 17 g by mouth daily as needed for mild constipation.   Tadalafil 2.5 MG Tabs Take 1 tablet (2.5 mg total) by mouth daily.   tamsulosin 0.4 MG Caps capsule Commonly known as: FLOMAX Take 1 capsule (0.4 mg total) by mouth daily after supper.   Toujeo Max SoloStar 300 UNIT/ML Solostar Pen Generic drug: insulin glargine (2 Unit Dial) Inject 100 Units into the skin daily in the afternoon. What changed: how much to take   Trulicity 4.5 JG/2.8ZM Sopn Generic drug: Dulaglutide Inject 4.5 mg as directed once a week.   VITRUM 50+ SENIOR MULTI  PO Take 1 tablet by mouth daily.               Discharge Care Instructions  (From admission, onward)  Start     Ordered   11/28/21 0000  Discharge wound care:       Comments: Pressure off loading Cleanse peineal skin with soap and water and pat dry, apply nystatin  Cleanse buttocks wounds with soap and water and pat dry. Apply alginate to wound bed and cover gluteal folds with silicone foam. Change daily and prn soilage.   11/28/21 0920           Allergies  Allergen Reactions   Morphine And Related Nausea And Vomiting    Causes nausea & vomiting   Codeine Sulfate Other (See Comments)    intolerant   Metformin And Related Other (See Comments)    Gi intolerance, not an allergy      The results of significant diagnostics from this hospitalization (including imaging, microbiology, ancillary and laboratory) are listed below for reference.    Significant Diagnostic Studies: DG CHEST PORT 1 VIEW  Result Date: 11/26/2021 CLINICAL DATA:  Dry cough. EXAM: PORTABLE CHEST 1 VIEW COMPARISON:  Chest radiograph dated 11/23/2021. FINDINGS: Left mid to lower lung field opacity similar or slightly progressed since the prior radiograph. A small left pleural effusion suspected. The right lung is clear. No pneumothorax. Stable cardiomediastinal silhouette. No acute osseous pathology. IMPRESSION: Left mid to lower lung field opacity similar or slightly progressed since the prior radiograph. Electronically Signed   By: Anner Crete M.D.   On: 11/26/2021 17:36   DG Swallowing Func-Speech Pathology  Result Date: 11/26/2021 Table formatting from the original result was not included. Objective Swallowing Evaluation: Type of Study: MBS-Modified Barium Swallow Study  Patient Details Name: DEIVI HUCKINS MRN: [id number] Date of Birth: 08-07-1940 Today's Date: 11/26/2021 Time: SLP Start Time (ACUTE ONLY): 0347 -SLP Stop Time (ACUTE ONLY): 4259 SLP Time Calculation (min) (ACUTE ONLY):  20 min Past Medical History: Past Medical History: Diagnosis Date  Arthritis   arthirtis all over  Bell's palsy   Cataract 2020  bilateral eyes -pending surgery June 2020  Concussion   multiple- college football player  Diabetes mellitus type 2 in obese ([place])   Frequent urination at night   GERD (gastroesophageal reflux disease)   occ indigestion  Glaucoma   both eyes  Hyperlipemia   Hypertension   Kidney stones   PONV (postoperative nausea and vomiting)   Primary localized osteoarthritis of right hip   Primary osteoarthritis of right knee   Restless leg syndrome   Right knee DJD   Spinal stenosis of lumbar region with radiculopathy   s/p L4/L5 transforaminal blocks Past Surgical History: Past Surgical History: Procedure Laterality Date  ABDOMINAL HERNIA REPAIR    ANTERIOR HIP REVISION Right 01/08/2015  Procedure: RIGHT ANTERIOR HIP REVISION;  Surgeon: Renette Butters, MD;  Location: [place];  Service: Orthopedics;  Laterality: Right;  BACK SURGERY    CANTHOPLASTY Right 02/09/2017  Procedure: RIGHT LATERAL CANTHOPLASTY;  Surgeon: Irene Limbo, MD;  Location: [place];  Service: Plastics;  Laterality: Right;  CANTHOPLASTY Left 06/29/2017  Procedure: LEFT LOWER CANTHOPLASTY;  Surgeon: Irene Limbo, MD;  Location: [place];  Service: Plastics;  Laterality: Left;  CATARACT EXTRACTION Left   COLONOSCOPY    ELBOW ARTHROSCOPY  1990  right  ELBOW SURGERY    right  HERNIA REPAIR  1980  inguinal  HIP CLOSED REDUCTION Right 11/09/2014  Procedure: CLOSED REDUCTION HIP, s/p total hip 8/9;  Surgeon: Ninetta Lights, MD;  Location: [place];  Service: Orthopedics;  Laterality: Right;  JOINT REPLACEMENT  2008  knee  LITHOTRIPSY    LUMBAR DISC SURGERY    2015  RECONSTRUCTION OF EYELID    REVISION TOTAL HIP ARTHROPLASTY Right 01/08/2015  SHOULDER ARTHROSCOPY  1980  right  SHOULDER SURGERY    right  STONE EXTRACTION WITH BASKET  2010  TOTAL HIP ARTHROPLASTY Right 11/06/2014  Procedure: RIGHT TOTAL  HIP ARTHROPLASTY ANTERIOR APPROACH;  Surgeon: Renette Butters, MD;  Location: [place];  Service: Orthopedics;  Laterality: Right;  TOTAL KNEE ARTHROPLASTY Left 07/2006  left knee  TOTAL KNEE ARTHROPLASTY Right 01/08/2014  Procedure: RIGHT TOTAL KNEE ARTHROPLASTY;  Surgeon: Lorn Junes, MD;  Location: [place];  Service: Orthopedics;  Laterality: Right; HPI: Pt is a 81 y.o. male who presented with generalized weakness, has been falling. CXR (11/23/21) revealed "Minimal left basilar atelectasis/airspace disease". Per MD note (8/28) there are concerns for possible PNA. PMH: bells palsy (x2, on each side) DM, HTN, HLD and RLS.  Subjective: pleasant, alert, cooperative  Recommendations for follow up therapy are one component of a multi-disciplinary discharge planning process, led by the attending physician.  Recommendations may be updated based on patient status, additional functional criteria and insurance authorization. Assessment / Plan / Recommendation   11/26/2021  12:51 PM Clinical Impressions Clinical Impression Patient presents with mild oral and mild-moderate pharyngeal phase dysphagia as per this MBS. During oral phase, he exhibited piecemeal swallowing with solids, decreased bolus cohesion with barium tablet and thin liquid barium and premature spillage of thin liquids into vallecular sinus. During pharyngeal phase, he exhibited swallow initiation delay to level of vallecular sinus with puree and regular solids and mild amount of residuals remaining in vallecular sinus post initial swallow. She exhibited penetration above vocal cords with nectar thick and thin liquids and exhibited silent aspiration with thin liquids when PO's still in pharynx and when tilting head back to try to swallow thin liquids with barium tablet. He exhibited silent aspiration of nectar thick liquids when taking a sip via a straw. He did not demonstrate or endorse any sensation to vallecular sinus residuals. He exhibited an instance of  retrograde movement of thin liquid bolus through cervical esophagus. In addition, presence of prominent cricopharyngeal bar was observed. SLP recommending Dys 3 (mechanical soft) solids and nectar thick liquids, no straws and with pills taken with puree. SLP will follow patient for toleration while here in hospital and he will benefit from SNF level SLP as well. SLP Visit Diagnosis Dysphagia, unspecified (R13.10) Impact on safety and function Moderate aspiration risk     11/26/2021  12:51 PM Treatment Recommendations Treatment Recommendations Therapy as outlined in treatment plan below     11/26/2021  12:58 PM Prognosis Prognosis for Safe Diet Advancement Good Barriers to Reach Goals Time post onset   11/26/2021  12:51 PM Diet Recommendations SLP Diet Recommendations Dysphagia 3 (Mech soft) solids;Nectar thick liquid Liquid Administration via Cup;No straw Medication Administration Whole meds with puree Compensations Minimize environmental distractions;Slow rate;Small sips/bites Postural Changes Seated upright at 90 degrees     11/26/2021  12:51 PM Other Recommendations Oral Care Recommendations Oral care BID Follow Up Recommendations Skilled nursing-short term rehab (<3 hours/day) Assistance recommended at discharge Frequent or constant Supervision/Assistance Functional Status Assessment Patient has had a recent decline in their functional status and demonstrates the ability to make significant improvements in function in a reasonable and predictable amount of time.   11/26/2021  12:51 PM Frequency and Duration  Speech Therapy Frequency (ACUTE ONLY) min 2x/week Treatment Duration 1 week  11/26/2021  12:39 PM Oral Phase Oral Phase Impaired Oral - Nectar Cup Weak lingual manipulation;Piecemeal swallowing Oral - Nectar Straw Weak lingual manipulation;Piecemeal swallowing Oral - Thin Cup Weak lingual manipulation;Premature spillage Oral - Puree Weak lingual manipulation;Reduced posterior propulsion;Piecemeal swallowing  Oral - Regular Weak lingual manipulation;Piecemeal swallowing Oral - Pill Decreased bolus cohesion;Premature spillage;Weak lingual manipulation    11/26/2021  12:40 PM Pharyngeal Phase Pharyngeal Phase Impaired Pharyngeal- Nectar Cup Reduced epiglottic inversion;Reduced airway/laryngeal closure;Penetration/Aspiration during swallow;Pharyngeal residue - valleculae Pharyngeal Material enters airway, remains ABOVE vocal cords then ejected out Pharyngeal- Nectar Straw Reduced epiglottic inversion;Reduced airway/laryngeal closure;Penetration/Aspiration during swallow Pharyngeal Material enters airway, passes BELOW cords without attempt by patient to eject out (silent aspiration) Pharyngeal- Thin Cup Reduced epiglottic inversion;Reduced tongue base retraction;Penetration/Aspiration during swallow;Reduced airway/laryngeal closure Pharyngeal Material enters airway, passes BELOW cords without attempt by patient to eject out (silent aspiration) Pharyngeal- Puree Delayed swallow initiation-vallecula;Pharyngeal residue - valleculae Pharyngeal- Regular Delayed swallow initiation-vallecula;Pharyngeal residue - valleculae Pharyngeal- Pill Penetration/Aspiration during swallow;Reduced airway/laryngeal closure Pharyngeal Material enters airway, passes BELOW cords without attempt by patient to eject out (silent aspiration)    11/26/2021  12:43 PM Cervical Esophageal Phase  Cervical Esophageal Phase Impaired Nectar Cup Prominent cricopharyngeal segment Nectar Straw Prominent cricopharyngeal segment Thin Cup Esophageal backflow into cervical esophagus;Prominent cricopharyngeal segment Puree Prominent cricopharyngeal segment Regular Prominent cricopharyngeal segment Pill Prominent cricopharyngeal segment Sonia Baller, MA, CCC-SLP Speech Therapy                     DG Chest Port 1 View  Result Date: 11/23/2021 CLINICAL DATA:  Questionable sepsis EXAM: PORTABLE CHEST 1 VIEW COMPARISON:  Chest x-ray 03/13/2016 FINDINGS: There are  minimal patchy opacities in the left lung base. The lungs are otherwise clear. There is no pleural effusion or pneumothorax. The cardiomediastinal silhouette is within normal limits. No acute fractures are seen. IMPRESSION: Minimal left basilar atelectasis/airspace disease. Correlate clinically for infection. Electronically Signed   By: Ronney Asters M.D.   On: 11/23/2021 15:33   CT Renal Stone Study  Result Date: 11/23/2021 CLINICAL DATA:  Left flank pain EXAM: CT ABDOMEN AND PELVIS WITHOUT CONTRAST TECHNIQUE: Multidetector CT imaging of the abdomen and pelvis was performed following the standard protocol without IV contrast. RADIATION DOSE REDUCTION: This exam was performed according to the departmental dose-optimization program which includes automated exposure control, adjustment of the mA and/or kV according to patient size and/or use of iterative reconstruction technique. COMPARISON:  03/19/2009, 10/18/2017 FINDINGS: Lower chest: Bibasilar airspace consolidations with trace left pleural effusion. Coronary artery atherosclerosis. Hepatobiliary: No focal liver abnormality is seen. Status post cholecystectomy. No biliary dilatation. Pancreas: Unremarkable. No pancreatic ductal dilatation or surrounding inflammatory changes. Spleen: Normal in size without focal abnormality. Adrenals/Urinary Tract: 1.1 cm right adrenal gland nodule with internal density of 22 HU, stable from chest CT from 2019 and benign. No follow-up imaging recommended. Multiple nonobstructing stones within the right kidney, largest measuring up to 7 mm. No left-sided renal calculi. Bilateral ureters are unremarkable. No hydronephrosis. Urinary bladder within normal limits. Stomach/Bowel: Stomach is within normal limits. No evidence of bowel wall thickening, distention, or inflammatory changes. Vascular/Lymphatic: Scattered aortoiliac atherosclerotic calcifications without aneurysm. No abdominopelvic lymphadenopathy. Reproductive: No mass or  other abnormality. Other: No free fluid. No abdominopelvic fluid collection. No pneumoperitoneum. Fat containing umbilical hernia. Musculoskeletal: No acute bony findings. Prior L4-5 fusion and right total hip arthroplasty. Findings of diffuse idiopathic skeletal hyperostosis. IMPRESSION: 1. Multiple nonobstructing right renal stones. No hydronephrosis. No explanation  for patient's left flank pain. 2. Bibasilar airspace consolidations with trace left pleural effusion, suspicious for multifocal pneumonia or aspiration. 3. Fat containing umbilical hernia. 4. Aortic and coronary artery atherosclerosis (ICD10-I70.0). Electronically Signed   By: Davina Poke D.O.   On: 11/23/2021 13:50    Microbiology: Recent Results (from the past 240 hour(s))  Urine Culture     Status: Abnormal   Collection Time: 11/23/21 11:57 AM   Specimen: Urine, Clean Catch  Result Value Ref Range Status   Specimen Description URINE, CLEAN CATCH  Final   Special Requests   Final    NONE Performed at [facility], [address], [place], [place] [address]    Culture MULTIPLE SPECIES PRESENT, SUGGEST RECOLLECTION (A)  Final   Report Status 11/25/2021 FINAL  Final  Resp Panel by RT-PCR (Flu A&B, Covid) Anterior Nasal Swab     Status: None   Collection Time: 11/23/21  3:00 PM   Specimen: Anterior Nasal Swab  Result Value Ref Range Status   SARS Coronavirus 2 by RT PCR NEGATIVE NEGATIVE Final    Comment: (NOTE) SARS-CoV-2 target nucleic acids are NOT DETECTED.  The SARS-CoV-2 RNA is generally detectable in upper respiratory specimens during the acute phase of infection. The lowest concentration of SARS-CoV-2 viral copies this assay can detect is 138 copies/mL. A negative result does not preclude SARS-Cov-2 infection and should not be used as the sole basis for treatment or other patient management decisions. A negative result may occur with  improper specimen collection/handling, submission of specimen other than  nasopharyngeal swab, presence of viral mutation(s) within the areas targeted by this assay, and inadequate number of viral copies(<138 copies/mL). A negative result must be combined with clinical observations, patient history, and epidemiological information. The expected result is Negative.  Fact Sheet for Patients:  EntrepreneurPulse.com.au  Fact Sheet for Healthcare Providers:  IncredibleEmployment.be  This test is no t yet approved or cleared by the [place] FDA and  has been authorized for detection and/or diagnosis of SARS-CoV-2 by FDA under an Emergency Use Authorization (EUA). This EUA will remain  in effect (meaning this test can be used) for the duration of the COVID-19 declaration under Section 564(b)(1) of the Act, 21 U.S.C.section 360bbb-3(b)(1), unless the authorization is terminated  or revoked sooner.       Influenza A by PCR NEGATIVE NEGATIVE Final   Influenza B by PCR NEGATIVE NEGATIVE Final    Comment: (NOTE) The Xpert Xpress SARS-CoV-2/FLU/RSV plus assay is intended as an aid in the diagnosis of influenza from Nasopharyngeal swab specimens and should not be used as a sole basis for treatment. Nasal washings and aspirates are unacceptable for Xpert Xpress SARS-CoV-2/FLU/RSV testing.  Fact Sheet for Patients: EntrepreneurPulse.com.au  Fact Sheet for Healthcare Providers: IncredibleEmployment.be  This test is not yet approved or cleared by the [place] FDA and has been authorized for detection and/or diagnosis of SARS-CoV-2 by FDA under an Emergency Use Authorization (EUA). This EUA will remain in effect (meaning this test can be used) for the duration of the COVID-19 declaration under Section 564(b)(1) of the Act, 21 U.S.C. section 360bbb-3(b)(1), unless the authorization is terminated or revoked.  Performed at [facility], [place] [address] [place], [place] [address]    Blood Culture (routine x 2)     Status: None   Collection Time: 11/23/21  3:00 PM   Specimen: BLOOD  Result Value Ref Range Status   Specimen Description BLOOD LEFT ANTECUBITAL  Final  Special Requests   Final    BOTTLES DRAWN AEROBIC AND ANAEROBIC Blood Culture results may not be optimal due to an excessive volume of blood received in culture bottles   Culture   Final    NO GROWTH 5 DAYS Performed at [facility], [place] [address], [place], [place] [address]    Report Status 11/28/2021 FINAL  Final     Labs: Basic Metabolic Panel: Recent Labs  Lab 11/24/21 0114 11/25/21 0147 11/26/21 0441 11/27/21 0141 11/28/21 0303  NA 135 139 136 135 134*  K 3.4* 3.5 3.7 3.6 3.7  CL 105 108 98 101 97*  CO2 25 21* 21* 23 27  GLUCOSE 190* 107* 214* 229* 245*  BUN 24* 21 25* 23 30*  CREATININE 1.56* 1.07 1.26* 1.20 1.45*  CALCIUM 8.3* 8.6* 8.9 8.8* 8.9  MG 2.0  --   --  1.9  --    Liver Function Tests: Recent Labs  Lab 11/23/21 1204  AST 21  ALT 28  ALKPHOS 64  BILITOT 2.0*  PROT 7.2  ALBUMIN 2.8*   No results for input(s): "LIPASE", "AMYLASE" in the last 168 hours. No results for input(s): "AMMONIA" in the last 168 hours. CBC: Recent Labs  Lab 11/23/21 1204 11/24/21 0114 11/25/21 0353 11/26/21 0441 11/27/21 0141 11/28/21 0303  WBC 16.6* 12.2* 9.6 9.7 10.9* 10.3  NEUTROABS 14.2*  --   --   --   --   --   HGB 17.3* 14.9 14.9 14.7 15.6 15.5  HCT 51.1 43.6 43.8 42.8 46.5 44.1  MCV 88.0 87.7 89.0 87.2 87.9 88.4  PLT 233 225 245 251 310 349   Cardiac Enzymes: No results for input(s): "CKTOTAL", "CKMB", "CKMBINDEX", "TROPONINI" in the last 168 hours. BNP: BNP (last 3 results) No results for input(s): "BNP" in the last 8760 hours.  ProBNP (last 3 results) No results for input(s): "PROBNP" in the last 8760 hours.  CBG: Recent Labs  Lab 11/27/21 1115 11/27/21 1609 11/27/21 2006 11/27/21 2339 11/28/21 0822  GLUCAP 246* 390* 343* 268* 249*    FURTHER  DISCHARGE INSTRUCTIONS:   Get Medicines reviewed and adjusted: Please take all your medications with you for your next visit with your Primary MD   Laboratory/radiological data: Please request your Primary MD to go over all hospital tests and procedure/radiological results at the follow up, please ask your Primary MD to get all Hospital records sent to his/her office.   In some cases, they will be blood work, cultures and biopsy results pending at the time of your discharge. Please request that your primary care M.D. goes through all the records of your hospital data and follows up on these results.   Also Note the following: If you experience worsening of your admission symptoms, develop shortness of breath, life threatening emergency, suicidal or homicidal thoughts you must seek medical attention immediately by calling 911 or calling your MD immediately  if symptoms less severe.   You must read complete instructions/literature along with all the possible adverse reactions/side effects for all the Medicines you take and that have been prescribed to you. Take any new Medicines after you have completely understood and accpet all the possible adverse reactions/side effects.    Do not drive when taking Pain medications or sleeping medications (Benzodaizepines)   Do not take more than prescribed Pain, Sleep and Anxiety Medications. It is not advisable to combine anxiety,sleep and pain medications without talking with your primary care practitioner   Special Instructions: If  you have smoked or chewed Tobacco  in the last 2 yrs please stop smoking, stop any regular Alcohol  and or any Recreational drug use.   Wear Seat belts while driving.   Please note: You were cared for by a hospitalist during your hospital stay. Once you are discharged, your primary care physician will handle any further medical issues. Please note that NO REFILLS for any discharge medications will be authorized once you are  discharged, as it is imperative that you return to your primary care physician (or establish a relationship with a primary care physician if you do not have one) for your post hospital discharge needs so that they can reassess your need for medications and monitor your lab values.     Signed:  Florencia Reasons MD, PhD, FACP  [facility] 11/28/2021, 9:31 AM

## 2021-11-28 NOTE — Progress Notes (Signed)
Pt has been DC to [facility], critical result was called in from Lab. MD was notified, MD will call the RN from [facility].  11/28/21 1742  Provider Notification  Provider Name/Title Xu MD  Date Provider Notified 11/28/21  Time Provider Notified 1740  Method of Notification Page  Notification Reason Critical result  Test performed and critical result positive Urine L Pneumophilia  Date Critical Result Received 11/28/21  Time Critical Result Received 1739  Provider response Other (Comment) (MD will call [facility],)  Date of Provider Response 11/28/21  Time of Provider Response 1743

## 2021-11-28 NOTE — Progress Notes (Unsigned)
Lab called to reports urine legionella testing result returned after he was discharge , he is tested +, he received zithromax /rocephin in the hospital, new prescription of  levaquin sent to SNF.

## 2021-11-28 NOTE — TOC Transition Note (Addendum)
Transition of Care [facility]) - CM/SW Discharge Note   Patient Details  Name: GALO SAYED MRN: [id number] Date of Birth: Oct 27, 1940  Transition of Care [facility]) CM/SW Contact:  Joanne Chars, LCSW Phone Number: 11/28/2021, 10:40 AM   Clinical Narrative:   Pt discharging to [facility].  RN call [phone] for report.   1030: confirmed with [facility] that they are ready to receive pt.   Final next level of care: Skilled Nursing Facility Barriers to Discharge: Barriers Resolved   Patient Goals and CMS Choice Patient states their goals for this hospitalization and ongoing recovery are:: "back to health" CMS Medicare.gov Compare Post Acute Care list provided to:: Patient Represenative (must comment) (wife) Choice offered to / list presented to : Patient, Spouse  Discharge Placement              Patient chooses bed at: [facility] Patient to be transferred to facility by: [facility] Name of family member notified: wife [name] Patient and family notified of of transfer: 11/28/21  Discharge Plan and Services In-house Referral: Clinical Social Work   Post Acute Care Choice: [facility]                               Social Determinants of Health (SDOH) Interventions     Readmission Risk Interventions     No data to display

## 2021-11-28 NOTE — Progress Notes (Signed)
Pt has DC order. [facility] was called in for report. VS has been stable, [facility] will tranport pt.

## 2021-11-28 NOTE — Inpatient Diabetes Management (Signed)
Inpatient Diabetes Program Recommendations  AACE/ADA: New Consensus Statement on Inpatient Glycemic Control (2015)  Target Ranges:  Prepandial:   less than 140 mg/dL      Peak postprandial:   less than 180 mg/dL (1-2 hours)      Critically ill patients:  140 - 180 mg/dL   Lab Results  Component Value Date   GLUCAP 268 (H) 11/27/2021   HGBA1C 7.7 (A) 10/01/2021    R  Latest Reference Range & Units 11/26/21 19:57 11/27/21 00:00 11/27/21 04:07 11/27/21 07:32 11/27/21 11:15 11/27/21 16:09 11/27/21 20:06 11/27/21 23:39  Glucose-Capillary 70 - 99 mg/dL 252 (H) 236 (H) 205 (H) 226 (H) 246 (H) 390 (H) 343 (H) 268 (H)  (H): Data is abnormally high eview of Glycemic Control  Diabetes history: DM 2 Outpatient Diabetes medications: Farxiga 10 mg Daily, Trulicity 4.5 mg weekly, Novolog 10 units tid, Toujeo 220 units Qpm  Current orders:   Semglee 35 units bid Novolog 0-9 units tid with meals  Inpatient Diabetes Program Recommendations:   Please consider: -Increase Semglee to 40 units bid -Add Novolog 3 units tid meal coverage if eats 50% meal  Thank you, Bethena Roys E. Tibor Lemmons, RN, MSN, CDE  Diabetes Coordinator Inpatient Glycemic Control Team Team Pager [phone] (8am-5pm) 11/28/2021 8:25 AM

## 2021-12-02 ENCOUNTER — Telehealth: Payer: Self-pay

## 2021-12-02 NOTE — Chronic Care Management (AMB) (Addendum)
Chronic Care Management Pharmacy Assistant   Name: [name]  MRN: [id number] DOB: [birth year]  Reason for Encounter: Hospital Follow Up   Medications: Outpatient Encounter Medications as of 12/02/2021  Medication Sig Note   allopurinol (ZYLOPRIM) 300 MG tablet TAKE 1/2 TABLET BY MOUTH EVERY MORNING (Patient taking differently: Take 150 mg by mouth daily.)    aspirin 81 MG tablet Take 81 mg by mouth daily.    atorvastatin (LIPITOR) 10 MG tablet TAKE 1 TABLET BY MOUTH EACH EVENING (Patient taking differently: Take 10 mg by mouth every evening.)    blood glucose meter kit and supplies Dispense based on patient and insurance preference. Use up to four times daily as directed. (FOR ICD-10 E10.9, E11.9).    Continuous Blood Gluc Receiver (FREESTYLE LIBRE 2 READER) DEVI USE TO CHECK BLOOD SUGAR DAILY    Continuous Blood Gluc Sensor (FREESTYLE LIBRE 2 SENSOR) MISC USE AS DIRECTED BY PROVIDER    dapagliflozin propanediol (FARXIGA) 10 MG TABS tablet Take 1 tablet (10 mg total) by mouth daily before breakfast.    diclofenac sodium (VOLTAREN) 1 % GEL Apply 2 g topically daily as needed (For pain).    dorzolamide-timolol (COSOPT) 22.3-6.8 MG/ML ophthalmic solution Place 1 drop into the left eye 2 (two) times daily.     Dulaglutide (TRULICITY) 4.5 WK/0.8UP SOPN Inject 4.5 mg as directed once a week. 11/23/2021: Take on Sundays    gemfibrozil (LOPID) 600 MG tablet TAKE 1 TABLET BY MOUTH EVERY MORNING AND1 IN THE EVENING (Patient taking differently: Take 600 mg by mouth 2 (two) times daily before a meal.)    glucose blood test strip Use as instructed to check blood glucose twice daily    insulin aspart (NOVOLOG) 100 UNIT/ML FlexPen 10 units with meals (Patient taking differently: Inject 10 Units into the skin 3 (three) times daily with meals.)    insulin glargine, 2 Unit Dial, (TOUJEO MAX SOLOSTAR) 300 UNIT/ML Solostar Pen Inject 100 Units into the skin daily in the afternoon.    Insulin Pen Needle  31G X 5 MM MISC 1 Device by Does not apply route in the morning, at noon, in the evening, and at bedtime.    latanoprost (XALATAN) 0.005 % ophthalmic solution Place 1 drop into both eyes at bedtime.    levofloxacin (LEVAQUIN) 500 MG tablet Take 1 tablet (500 mg total) by mouth daily for 5 days.    losartan (COZAAR) 100 MG tablet TAKE 1 TABLET BY MOUTH DAILY (Patient taking differently: Take 50 mg by mouth daily.)    metoprolol succinate (TOPROL-XL) 25 MG 24 hr tablet TAKE 1 TABLET BY MOUTH DAILY (Patient taking differently: Take 25 mg by mouth daily.)    Multiple Vitamins-Minerals (VITRUM 50+ SENIOR MULTI PO) Take 1 tablet by mouth daily.     nystatin (MYCOSTATIN/NYSTOP) powder Apply topically 2 (two) times daily.    polyethylene glycol (MIRALAX / GLYCOLAX) 17 g packet Take 17 g by mouth daily as needed for mild constipation.    Tadalafil 2.5 MG TABS Take 1 tablet (2.5 mg total) by mouth daily. 11/23/2021: Taking every day per patient    tamsulosin (FLOMAX) 0.4 MG CAPS capsule Take 1 capsule (0.4 mg total) by mouth daily after supper.    No facility-administered encounter medications on file as of 12/02/2021.     Reviewed hospital notes for details of recent visit. Patient has been contacted by Transitions of Care team: No  Admitted to the hospital on 11/23/21. Discharge date was 11/28/21.  Discharged from [facility].   Discharge diagnosis (Principal Problem): Generalized Weakness Patient was discharged to Skilled nursing facility  Brief summary of hospital course: presenting with generalized weakness.  He has been falling. He has not actually passed out, just feels too weak to stand.  No fever.  His wife heard rattling in his chest yesterday.  No SOB.  His wife reports a very foul urinary odor (Fire Dept also suggested this might suggest a UTI).   FTT -Presented to the hospital with weakness,  increased falls, he is eager to participate with therapy to get stronger -seen by  palliative care, PT/OT, discharge to snf with palliative care continue to follow   Leukocytosis, aspiration pneumonia, UTI -CT renal stone showed multiple nonobstructing right renal stones, also showed  bibasilar airspace consolidation.  Received abx , leukocytosis resolved, denies pain, no cough, no hypoxia Seen by speech, currently on soft diet/nectar thick liquid Appear volume overloaded, feeling better with IV Lasix 69m qd on 8/30 and 8/31, volume improved May need lasix prn for edema moving forward    Addendum after discharge: Lab called to report urine legionella testing result returned after he was discharged.  he is tested +. he received zithromax /rocephin in the hospital, new prescription of  levaquin sent to SNF.   Recommendations for Outpatient Follow-up:  F/u with SNF MD for hospital discharge follow up, repeat cbc/bmp at follow up. Palliative care to continue follow patient at SNF and after discharge from SNF   [facility] -Start[facility] Nystatin   Polyethylene glycol  tamsulosin   Medication Changes at Hospital Discharge: -Changed toujeo max  Medications Discontinued at Hospital Discharge: -Stopped amlodipine  Citrucel  Meloxicam  Miconazole 2%  Mupirocin 2%  Medications that remain the same after Hospital Discharge:??  -All other medications will remain the same.    Next CCM appt: none  Other upcoming appts: No appointments scheduled within the next 30 days.  LCharlene Brooke PharmD notified and will determine if action is needed.   [place] [phone]

## 2021-12-29 ENCOUNTER — Telehealth: Payer: Self-pay | Admitting: Family Medicine

## 2021-12-29 NOTE — Telephone Encounter (Signed)
Patient wife [name] called in this morning and stated that [name] passed away today 2021-12-31. She stated that he was in the hospital for 5 days for an UTI and pneumonia in both lungs. He went to a nursing home for about 2-3 weeks and she brought him home. She stated that this morning his blood sugar went down to 49, so she think it may have been a diabetic coma.

## 2021-12-30 NOTE — Telephone Encounter (Signed)
Late entry, called wife yesterday and offered condolences.   site for death certificate was unavailable at the time.  Will work on that when possible.  Was glad to see this gentleman in clinic and he will be missed.  She thanked me for the call.

## 2021-12-31 NOTE — Telephone Encounter (Signed)
Chart has been updated.

## 2021-12-31 NOTE — Telephone Encounter (Signed)
Death certificate done.  Please update chart.  Thanks.

## 2022-01-02 ENCOUNTER — Inpatient Hospital Stay: Payer: HMO | Admitting: Family Medicine

## 2022-01-28 DEATH — deceased

## 2022-04-02 ENCOUNTER — Ambulatory Visit: Payer: HMO | Admitting: Internal Medicine
